# Patient Record
Sex: Female | Born: 2000
Health system: Southern US, Community
[De-identification: ages and names within clinical notes are randomized; demographics above are authoritative.]

## PROBLEM LIST (undated history)

## (undated) ENCOUNTER — Ambulatory Visit (HOSPITAL_COMMUNITY): Payer: Medicaid Other

## (undated) ENCOUNTER — Ambulatory Visit (HOSPITAL_COMMUNITY): Admission: EM | Payer: Medicaid Other | Source: Home / Self Care

## (undated) DIAGNOSIS — G43909 Migraine, unspecified, not intractable, without status migrainosus: Secondary | ICD-10-CM

## (undated) DIAGNOSIS — F1120 Opioid dependence, uncomplicated: Secondary | ICD-10-CM

## (undated) DIAGNOSIS — F4010 Social phobia, unspecified: Secondary | ICD-10-CM

## (undated) DIAGNOSIS — N920 Excessive and frequent menstruation with regular cycle: Secondary | ICD-10-CM

## (undated) DIAGNOSIS — F32A Depression, unspecified: Secondary | ICD-10-CM

## (undated) DIAGNOSIS — F209 Schizophrenia, unspecified: Secondary | ICD-10-CM

## (undated) DIAGNOSIS — F329 Major depressive disorder, single episode, unspecified: Secondary | ICD-10-CM

## (undated) DIAGNOSIS — N898 Other specified noninflammatory disorders of vagina: Secondary | ICD-10-CM

## (undated) DIAGNOSIS — Z309 Encounter for contraceptive management, unspecified: Secondary | ICD-10-CM

## (undated) DIAGNOSIS — R300 Dysuria: Secondary | ICD-10-CM

## (undated) DIAGNOSIS — R45851 Suicidal ideations: Secondary | ICD-10-CM

## (undated) DIAGNOSIS — Z7689 Persons encountering health services in other specified circumstances: Secondary | ICD-10-CM

## (undated) DIAGNOSIS — N946 Dysmenorrhea, unspecified: Secondary | ICD-10-CM

## (undated) HISTORY — DX: Other specified noninflammatory disorders of vagina: N89.8

## (undated) HISTORY — DX: Migraine, unspecified, not intractable, without status migrainosus: G43.909

## (undated) HISTORY — DX: Encounter for contraceptive management, unspecified: Z30.9

## (undated) HISTORY — DX: Dysuria: R30.0

## (undated) HISTORY — PX: NO PAST SURGERIES: SHX2092

## (undated) HISTORY — DX: Dysmenorrhea, unspecified: N94.6

## (undated) HISTORY — DX: Persons encountering health services in other specified circumstances: Z76.89

## (undated) HISTORY — DX: Excessive and frequent menstruation with regular cycle: N92.0

---
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
## 2019-02-03 ENCOUNTER — Encounter (HOSPITAL_COMMUNITY): Payer: Self-pay | Admitting: Emergency Medicine

## 2019-02-03 ENCOUNTER — Emergency Department (HOSPITAL_COMMUNITY)
Admission: EM | Admit: 2019-02-03 | Discharge: 2019-02-03 | Disposition: A | Discharge status: Home / Self Care | Payer: Self-pay | Attending: Emergency Medicine | Admitting: Emergency Medicine

## 2019-02-03 ENCOUNTER — Other Ambulatory Visit: Payer: Self-pay

## 2019-02-03 DIAGNOSIS — L551 Sunburn of second degree: Secondary | ICD-10-CM | POA: Insufficient documentation

## 2019-02-03 DIAGNOSIS — F17210 Nicotine dependence, cigarettes, uncomplicated: Secondary | ICD-10-CM | POA: Insufficient documentation

## 2019-02-03 DIAGNOSIS — L559 Sunburn, unspecified: Secondary | ICD-10-CM

## 2019-02-03 DIAGNOSIS — Z79899 Other long term (current) drug therapy: Secondary | ICD-10-CM | POA: Insufficient documentation

## 2019-02-03 NOTE — Discharge Instructions (Addendum)
Please apply cool compresses or soaks, calamine lotion, or aloe vera to your skin.  You may take Aleve or ibuprofen, twice a day to help with the pain.  You may clean your ruptured blister with mild soap and water along with cover it with wet dressings.

## 2019-02-03 NOTE — ED Triage Notes (Signed)
Pt arrives with sunburn that now has blisters on her shoulders that started yesterday. Pt was not able to sleep.

## 2019-02-03 NOTE — ED Provider Notes (Signed)
[facility] EMERGENCY DEPARTMENT Provider Note   CSN: [id number] Arrival date & time: 02/03/19  0867    History   Chief Complaint Chief Complaint  Patient presents with  . Blister    HPI Julia [name] is a 18 y.o. female.     18 y.o female with a PMH of Heroin addiction, Depression,Bipolar 1 presents to the ED with a chief complaint of blister x yesterday. Patient reports she was out in the sun on Monday did not apply any sun screen on and now has redness of her skin along with a big blister surrounded with small blisters around her shoulders. She reports pain with touch along with unable to sleep due to redness. She  Has tried calamine lotion without improvement. She denies any fever, abdominal pain or rashes.   The history is provided by the patient and medical records.    Past Medical History:  Diagnosis Date  . Burning with urination 05/03/2015  . Contraceptive management 05/03/2015  . Depression   . Dysmenorrhea 12/30/2013  . Heroin addiction ([place])   . Menorrhagia 12/30/2013  . Menstrual extraction 12/30/2013  . Migraines   . Social anxiety disorder 09/25/2016  . Vaginal odor 05/03/2015    Patient Active Problem List   Diagnosis Date Noted  . Bipolar 1 disorder, depressed, severe ([place]) 01/06/2018  . Polysubstance dependence including opioid type drug, continuous use ([place]) 01/06/2018  . Petechiae 10/09/2017  . Social anxiety disorder 09/25/2016  . MDD (major depressive disorder), recurrent episode ([place]) 09/24/2016  . Substance induced mood disorder ([place]) 04/29/2016  . Depressed mood 04/17/2016  . Polysubstance abuse ([place])   . Recurrent UTI (urinary tract infection) postcoital 08/21/2015  . Dysmenorrhea 12/30/2013    Past Surgical History:  Procedure Laterality Date  . NO PAST SURGERIES       OB History    Gravida  0   Para      Term      Preterm      AB      Living        SAB      TAB      Ectopic      Multiple      Live Births               Home Medications    Prior to Admission medications   Medication Sig Start Date End Date Taking? Authorizing Provider  etonogestrel (NEXPLANON) 68 MG IMPL implant 1 each (68 mg total) by Subdermal route once for 1 dose. (Birth control method) 01/08/18 01/08/18  Lindell Spar I, NP  hydrOXYzine (ATARAX/VISTARIL) 50 MG tablet Take 1 tablet (50 mg total) by mouth every 6 (six) hours as needed for anxiety. 01/08/18   Lindell Spar I, NP  nicotine polacrilex (NICORETTE) 2 MG gum Take 1 each (2 mg total) by mouth as needed for smoking cessation. (May buy from over the counter): For smoking cessation 01/08/18   Lindell Spar I, NP  potassium chloride SA (K-DUR) 20 MEQ tablet Take 1 tablet (20 mEq total) by mouth 2 (two) times daily. 12/13/93   Delora Fuel, MD  traZODone (DESYREL) 50 MG tablet Take 1 tablet (50 mg total) by mouth at bedtime as needed for sleep. 01/08/18   Encarnacion Slates, NP    Family History Family History  Problem Relation Age of Onset  . Depression Mother   . Hypertension Father   . Hyperlipidemia Father   . Cancer Paternal Grandmother  breast, uterine  . Cirrhosis Paternal Grandfather        due to alcohol    Social History Social History   Tobacco Use  . Smoking status: Current Every Day Smoker    Packs/day: 0.25    Types: Cigarettes  . Smokeless tobacco: Never Used  . Tobacco comment: parents smoke   Substance Use Topics  . Alcohol use: Yes  . Drug use: Yes    Types: Marijuana, Cocaine, Oxycodone    Comment: opitates, xanax, canned air     Allergies   Patient has no known allergies.   Review of Systems Review of Systems  Constitutional: Negative for fever.  Skin: Positive for color change. Negative for pallor, rash and wound.     Physical Exam Updated Vital Signs BP 116/70   Pulse 88   Temp 97.7 F (36.5 C) (Oral)   Resp 17   Ht 5\' 3"  (1.6 m)   Wt 61.2 kg   SpO2 99%   BMI 23.91 kg/m   Physical Exam Vitals  signs and nursing note reviewed.  Constitutional:      General: She is not in acute distress.    Appearance: She is well-developed.  HENT:     Head: Normocephalic and atraumatic.     Mouth/Throat:     Pharynx: No oropharyngeal exudate.  Eyes:     Pupils: Pupils are equal, round, and reactive to light.  Neck:     Musculoskeletal: Normal range of motion.  Cardiovascular:     Rate and Rhythm: Regular rhythm.     Heart sounds: Normal heart sounds.  Pulmonary:     Effort: Pulmonary effort is normal. No respiratory distress.     Breath sounds: Normal breath sounds.  Abdominal:     General: Bowel sounds are normal. There is no distension.     Palpations: Abdomen is soft.     Tenderness: There is no abdominal tenderness.  Musculoskeletal:        General: No tenderness or deformity.     Right shoulder: She exhibits pain. She exhibits normal strength.     Left shoulder: She exhibits no pain.       Arms:     Right lower leg: No edema.     Left lower leg: No edema.  Skin:    General: Skin is warm and dry.  Neurological:     Mental Status: She is alert and oriented to person, place, and time.      ED Treatments / Results  Labs (all labs ordered are listed, but only abnormal results are displayed) Labs Reviewed - No data to display  EKG None  Radiology No results found.  Procedures Procedures (including critical care time)  Medications Ordered in ED Medications - No data to display   Initial Impression / Assessment and Plan / ED Course  I have reviewed the triage vital signs and the nursing notes.  Pertinent labs & imaging results that were available during my care of the patient were reviewed by me and considered in my medical decision making (see chart for details).    Patient with a past medical history of bipolar disorder presents to the ED with complaints of sunburn since yesterday.  Patient reports she was out in the sun on Monday, did not apply any sunscreen now  has blisters along her right shoulder along with small ones on her left shoulder.  During primary evaluation skin appears erythematous, small pinpoint blisters throughout bilateral shoulders, one large blister on  the right shoulder.  Reports she has been applying calamine lotion however has not attempted to pop this blister.  Patient's vitals are stable, she is had no fever, headache, nausea or vomiting.  Low suspicion for sun intoxication.  Patient currently has trazodone listed as 1 of her dedication's, this could be causing hypersensitivity reaction however patient reports she has not taken this medication in several months.  Advised patient she would need to keep skin hydrated, once blister ruptures she may clean this with mild soap and water.  Patient with otherwise stable vital signs discharged from the ED afebrile.  Return precautions discussed at length.     Portions of this note were generated with [employer]. Dictation errors may occur despite best attempts at proofreading.   Final Clinical Impressions(s) / ED Diagnoses   Final diagnoses:  Burn from the sun  Sunburn, blistering    ED Discharge Orders    None       Janeece Fitting, [place] 02/03/19 [id number]    Tegeler, Gwenyth Allegra, MD 02/03/19 [phone]

## 2019-02-16 ENCOUNTER — Encounter (HOSPITAL_COMMUNITY): Payer: Self-pay | Admitting: Clinical

## 2019-02-16 ENCOUNTER — Other Ambulatory Visit: Payer: Self-pay | Admitting: Behavioral Health

## 2019-02-16 ENCOUNTER — Other Ambulatory Visit: Payer: Self-pay

## 2019-02-16 ENCOUNTER — Inpatient Hospital Stay (HOSPITAL_COMMUNITY)
Admission: RE | Admit: 2019-02-16 | Discharge: 2019-02-19 | DRG: 885 | Disposition: A | Discharge status: Home / Self Care | Payer: Federal, State, Local not specified - Other | Attending: Psychiatry | Admitting: Psychiatry

## 2019-02-16 DIAGNOSIS — F17210 Nicotine dependence, cigarettes, uncomplicated: Secondary | ICD-10-CM | POA: Diagnosis present

## 2019-02-16 DIAGNOSIS — F13239 Sedative, hypnotic or anxiolytic dependence with withdrawal, unspecified: Secondary | ICD-10-CM | POA: Diagnosis not present

## 2019-02-16 DIAGNOSIS — Z8249 Family history of ischemic heart disease and other diseases of the circulatory system: Secondary | ICD-10-CM | POA: Diagnosis not present

## 2019-02-16 DIAGNOSIS — G4700 Insomnia, unspecified: Secondary | ICD-10-CM | POA: Diagnosis present

## 2019-02-16 DIAGNOSIS — F4010 Social phobia, unspecified: Secondary | ICD-10-CM | POA: Diagnosis present

## 2019-02-16 DIAGNOSIS — Z915 Personal history of self-harm: Secondary | ICD-10-CM

## 2019-02-16 DIAGNOSIS — F1210 Cannabis abuse, uncomplicated: Secondary | ICD-10-CM | POA: Diagnosis present

## 2019-02-16 DIAGNOSIS — Z818 Family history of other mental and behavioral disorders: Secondary | ICD-10-CM

## 2019-02-16 DIAGNOSIS — F1510 Other stimulant abuse, uncomplicated: Secondary | ICD-10-CM | POA: Diagnosis present

## 2019-02-16 DIAGNOSIS — Z811 Family history of alcohol abuse and dependence: Secondary | ICD-10-CM | POA: Diagnosis not present

## 2019-02-16 DIAGNOSIS — F333 Major depressive disorder, recurrent, severe with psychotic symptoms: Principal | ICD-10-CM | POA: Diagnosis present

## 2019-02-16 DIAGNOSIS — Z20828 Contact with and (suspected) exposure to other viral communicable diseases: Secondary | ICD-10-CM | POA: Diagnosis present

## 2019-02-16 DIAGNOSIS — R45851 Suicidal ideations: Secondary | ICD-10-CM | POA: Diagnosis present

## 2019-02-16 DIAGNOSIS — F410 Panic disorder [episodic paroxysmal anxiety] without agoraphobia: Secondary | ICD-10-CM | POA: Diagnosis present

## 2019-02-16 DIAGNOSIS — F1110 Opioid abuse, uncomplicated: Secondary | ICD-10-CM | POA: Diagnosis present

## 2019-02-16 DIAGNOSIS — Z8349 Family history of other endocrine, nutritional and metabolic diseases: Secondary | ICD-10-CM | POA: Diagnosis not present

## 2019-02-16 LAB — SARS CORONAVIRUS 2 BY RT PCR (HOSPITAL ORDER, PERFORMED IN ~~LOC~~ HOSPITAL LAB): SARS Coronavirus 2: NEGATIVE

## 2019-02-16 MED ORDER — NICOTINE POLACRILEX 2 MG MT GUM
2.0000 mg | CHEWING_GUM | OROMUCOSAL | Status: DC | PRN
  Administered 2019-02-17 – 2019-02-19 (×4): 2 mg via ORAL

## 2019-02-16 MED ORDER — TRAZODONE HCL 50 MG PO TABS
50.0000 mg | ORAL_TABLET | Freq: Every evening | ORAL | Status: DC | PRN
  Administered 2019-02-16: 50 mg via ORAL

## 2019-02-16 MED ORDER — ALUM & MAG HYDROXIDE-SIMETH 200-200-20 MG/5ML PO SUSP: 30.0000 mL | ORAL | Status: DC | PRN

## 2019-02-16 NOTE — Progress Notes (Signed)
UA cup given. Pt made aware. Pending sample.

## 2019-02-16 NOTE — Progress Notes (Signed)
Patient presents with depressed/sad/tearful affect and behavior during admission interview and assessment. VS monitored and recorded. Skin check performed with Suezanne Jacquet and revealed tattoos. Contraband was not found. Patient was oriented to unit and schedule. Pt states "I want to get help for my substance abuse. Im in a deep hole. I need to get out". Pt denies SI/HI/AVH at this time. PO fluids and food provided. Safety maintained. Rest encouraged.    [place] NOVEL CORONAVIRUS (COVID-19) DAILY CHECK-OFF SYMPTOMS - answer yes or no to each - every day NO YES  Have you had a fever in the past 24 hours?  . Fever (Temp > 37.80C / 100F) X   Have you had any of these symptoms in the past 24 hours? . New Cough .  Sore Throat  .  Shortness of Breath .  Difficulty Breathing .  Unexplained Body Aches   X   Have you had any one of these symptoms in the past 24 hours not related to allergies?   . Runny Nose .  Nasal Congestion .  Sneezing   X   If you have had runny nose, nasal congestion, sneezing in the past 24 hours, has it worsened?  X   EXPOSURES - check yes or no X   Have you traveled outside the state in the past 14 days?  X   Have you been in contact with someone with a confirmed diagnosis of COVID-19 or PUI in the past 14 days without wearing appropriate PPE?  X   Have you been living in the same home as a person with confirmed diagnosis of COVID-19 or a PUI (household contact)?    X   Have you been diagnosed with COVID-19?    X              What to do next: Answered NO to all: Answered YES to anything:   Proceed with unit schedule Follow the [facility] Inpatient Flowsheet.

## 2019-02-16 NOTE — BH Assessment (Signed)
Assessment Note  Julia [name] is an 18 y.o. female presenting voluntarily to [facility] for assessment. Patient is tearful. When asked why she is here patient is tearful and states "I have a diagnosis and I don't want one and I don't want anything to be wrong with me." Patient states she was diagnosed with DMDD at 18 years old. Patient reports increased agitation, depression, not needing sleep, thoughts of suicide and mood lability. Patient states until last night she had not slept in 3-4 days. She states it is not uncommon for her to go days without sleeping, even when not under the influence of illegal drugs. Patient reports current suicidal thoughts without specific plan, but also states she does not believe she can keep herself safe. Patient reports 1 prior suicide attempt at 18 years old when she cut her wrists. She endorses HI, stating "I want to hurt people right now." She states that she gets so angry sometimes she feels like she could actually kill someone. She denies AVH. Patient has a history of polysubstance use and accidental overdosing. She reports she was clean for 2 months until using meth 3 days ago. She reports intermittent use. Patient has a history of heroin, cocaine, Xanax, and THC use. Patient states that she does not have any current charges or upcoming court dates.  Patient is alert and oriented x 4. She is dressed appropriately and tearful during points in assessment. Her speech is rapid/pressed, eye contact is fair, and thoughts are somewhat disorganized. Her mood is labile and affect is congruent. Patient's insight, judgement, and impulse control are impaired. Patient does not appear to be responding to internal stimuli or experiencing delusional thought content.  Diagnosis:  Bipolar I, current episode, manic, severe   Polysubstance use   R/o Amphetamine induced bipolar or related disorder  Past Medical History:  Past Medical History:  Diagnosis Date  . Burning with urination  05/03/2015  . Contraceptive management 05/03/2015  . Depression   . Dysmenorrhea 12/30/2013  . Heroin addiction ([place])   . Menorrhagia 12/30/2013  . Menstrual extraction 12/30/2013  . Migraines   . Social anxiety disorder 09/25/2016  . Vaginal odor 05/03/2015    Past Surgical History:  Procedure Laterality Date  . NO PAST SURGERIES      Family History:  Family History  Problem Relation Age of Onset  . Depression Mother   . Hypertension Father   . Hyperlipidemia Father   . Cancer Paternal Grandmother        breast, uterine  . Cirrhosis Paternal Grandfather        due to alcohol    Social History:  reports that she has been smoking cigarettes. She has been smoking about 0.25 packs per day. She has never used smokeless tobacco. She reports current alcohol use. She reports current drug use. Drugs: Marijuana, Cocaine, and Oxycodone.  Additional Social History:     CIWA: CIWA-Ar BP: 121/87 Pulse Rate: (!) 122 COWS:    Allergies: No Known Allergies  Home Medications:  No medications prior to admission.    OB/GYN Status:  No LMP recorded. Patient has had an implant.  General Assessment Data Location of Assessment: [facility] Assessment Services TTS Assessment: In system Is this a Tele or Face-to-Face Assessment?: Face-to-Face Is this an Initial Assessment or a Re-assessment for this encounter?: Initial Assessment Patient Accompanied by:: N/A Language Other than English: No Living Arrangements: Other (Comment)(with father) What gender do you identify as?: Female Marital status: Single Maiden name: [place] Pregnancy  Status: No Living Arrangements: Parent, Other relatives Can pt return to current living arrangement?: Yes Admission Status: Voluntary Is patient capable of signing voluntary admission?: Yes Referral Source: Self/Family/Friend Insurance type: Medicaid     Crisis Care Plan Living Arrangements: Parent, Other relatives Legal Guardian: (self) Name of Psychiatrist:  none Name of Therapist: none  Education Status Is patient currently in school?: No Is the patient employed, unemployed or receiving disability?: Unemployed  Risk to self with the past 6 months Suicidal Ideation: Yes-Currently Present Has patient been a risk to self within the past 6 months prior to admission? : Yes Suicidal Intent: No Has patient had any suicidal intent within the past 6 months prior to admission? : No Is patient at risk for suicide?: Yes Suicidal Plan?: No Has patient had any suicidal plan within the past 6 months prior to admission? : No Access to Means: Yes Specify Access to Suicidal Means: illegal drugs What has been your use of drugs/alcohol within the last 12 months?: meth use 2 days ago Previous Attempts/Gestures: Yes How many times?: 1 Other Self Harm Risks: none noted Triggers for Past Attempts: Unknown Intentional Self Injurious Behavior: None Family Suicide History: No Recent stressful life event(s): Other (Comment)(substance use) Persecutory voices/beliefs?: No Depression: Yes Depression Symptoms: Despondent, Insomnia, Tearfulness, Isolating, Fatigue, Guilt, Loss of interest in usual pleasures, Feeling worthless/self pity, Feeling angry/irritable Substance abuse history and/or treatment for substance abuse?: No Suicide prevention information given to non-admitted patients: Not applicable  Risk to Others within the past 6 months Homicidal Ideation: Yes-Currently Present Does patient have any lifetime risk of violence toward others beyond the six months prior to admission? : No Thoughts of Harm to Others: Yes-Currently Present Comment - Thoughts of Harm to Others: currently wants to hurt others- no one specific Current Homicidal Intent: No Current Homicidal Plan: No Access to Homicidal Means: No Identified Victim: none History of harm to others?: No Assessment of Violence: None Noted Violent Behavior Description: none noted Does patient have  access to weapons?: No Criminal Charges Pending?: No Does patient have a court date: No Is patient on probation?: No  Psychosis Hallucinations: None noted Delusions: None noted  Mental Status Report Appearance/Hygiene: Unremarkable Eye Contact: Fair Motor Activity: Freedom of movement Speech: Rapid, Pressured Level of Consciousness: Restless, Irritable Mood: Labile Affect: Labile Anxiety Level: Moderate Thought Processes: Circumstantial Judgement: Impaired Orientation: Person, Place, Time, Situation Obsessive Compulsive Thoughts/Behaviors: None  Cognitive Functioning Concentration: Poor Memory: Recent Intact, Remote Intact Is patient IDD: No Insight: Poor Impulse Control: Poor Appetite: Poor Have you had any weight changes? : Loss Amount of the weight change? (lbs): (UTA) Sleep: Decreased Total Hours of Sleep: 0 Vegetative Symptoms: None  ADLScreening [facility] Assessment Services) Patient's cognitive ability adequate to safely complete daily activities?: Yes Patient able to express need for assistance with ADLs?: Yes Independently performs ADLs?: Yes (appropriate for developmental age)  Prior Inpatient Therapy Prior Inpatient Therapy: Yes Prior Therapy Dates: 2017 Prior Therapy Facilty/Provider(s): [facility] Reason for Treatment: substance use, self harm  Prior Outpatient Therapy Prior Outpatient Therapy: No Does patient have an ACCT team?: No Does patient have Intensive In-House Services?  : No Does patient have [facility] services? : No Does patient have P4CC services?: No  ADL Screening (condition at time of admission) Patient's cognitive ability adequate to safely complete daily activities?: Yes Is the patient deaf or have difficulty hearing?: No Does the patient have difficulty seeing, even when wearing glasses/contacts?: No Does the patient have difficulty concentrating, remembering, or  making decisions?: No Patient able to express need for assistance with  ADLs?: Yes Does the patient have difficulty dressing or bathing?: No Independently performs ADLs?: Yes (appropriate for developmental age) Does the patient have difficulty walking or climbing stairs?: No Weakness of Legs: None Weakness of Arms/Hands: None  Home Assistive Devices/Equipment Home Assistive Devices/Equipment: None  Therapy Consults (therapy consults require a physician order) PT Evaluation Needed: No OT Evalulation Needed: No SLP Evaluation Needed: No Abuse/Neglect Assessment (Assessment to be complete while patient is alone) Abuse/Neglect Assessment Can Be Completed: Unable to assess, patient is non-responsive or altered mental status Values / Beliefs Cultural Requests During Hospitalization: None Spiritual Requests During Hospitalization: None Consults Spiritual Care Consult Needed: No Social Work Consult Needed: No Regulatory affairs officer (For Healthcare) Does Patient Have a Medical Advance Directive?: No Would patient like information on creating a medical advance directive?: No - Patient declined          Disposition: Mordecai Maes, NP recommends in patient treatment. Patient accepted to [facility] 303-1. Disposition Initial Assessment Completed for this Encounter: Yes Disposition of Patient: Admit Type of inpatient treatment program: Adult Patient refused recommended treatment: No Mode of transportation if patient is discharged/movement?: N/A  On Site Evaluation by:   Reviewed with Physician:    Orvis Brill 02/16/2019 3:43 PM

## 2019-02-16 NOTE — H&P (Signed)
Behavioral Health Medical Screening Exam  Julia [name] is an 18 y.o. female.who presents as a walk-in to [facility], voluntarily. She endorses she is stressed after receiving a diagnosis of DMDD in 2016. During this evaluation her mood is very labile. She admits to having mood swings that consists of irritability. She notes that last night she slept well however for the 3-4 not prior, she had not slept. She reports anxiety describes as excessive worry and reports having experienced recent panic attacks. As per chart review, she has had admissions to [facility] in 2019 and on 11/13/2018 she went to the ED following an opioid overdose. She admits to using meth with last use two days ago. She also admits to using heroin in the past Xanax/pain pills two weeks ago. She endorses suicidal thoughts, denies pan or intent, although is unable to contract for safety if she goes home. When asked about homicidal ideations she reports having thoughts of wanting to harm someone, non specific. She denies AVH. Reports a history of cutting behaviors but non recent.   Total Time spent with patient: 20 minutes  Psychiatric Specialty Exam: Physical Exam  Vitals reviewed. Constitutional: She is oriented to person, place, and time.  Neurological: She is alert and oriented to person, place, and time.    ROS  Blood pressure 121/87, pulse (!) 122, temperature 98.4 F (36.9 C), resp. rate 16, SpO2 99 %.There is no height or weight on file to calculate BMI.  General Appearance: Fairly Groomed  Eye Contact:  Good  Speech:  Clear and Coherent and Normal Rate  Volume:  Increased  Mood:  Anxious and Depressed  Affect:  Labile  Thought Process:  Coherent, Linear and Descriptions of Associations: Intact  Orientation:  Full (Time, Place, and Person)  Thought Content:  WDL  Suicidal Thoughts:  Yes.  without intent/plan  Homicidal Thoughts:  No  Memory:  Immediate;   Fair Recent;   Fair  Judgement:  Fair  Insight:  Fair  Psychomotor  Activity:  Normal  Concentration: Concentration: Fair and Attention Span: Fair  Recall:  [employer] of Knowledge:Fair  Language: Good  Akathisia:  Negative  Handed:  Right  AIMS (if indicated):     Assets:  Communication Skills Resilience  Sleep:       Musculoskeletal: Strength & Muscle Tone: within normal limits Gait & Station: normal Patient leans: N/A  Blood pressure 121/87, pulse (!) 122, temperature 98.4 F (36.9 C), resp. rate 16, SpO2 99 %.  Recommendations:  Based on my evaluation the patient does not appear to have an emergency medical condition.   There is evidence of imminent risk to self or others at present.   Patient meet criteria for psychiatric inpatient admission. Patient has been given bed 303 on the adult psychiatric unit.      Mordecai Maes, NP 02/16/2019, 3:26 PM

## 2019-02-17 DIAGNOSIS — F333 Major depressive disorder, recurrent, severe with psychotic symptoms: Principal | ICD-10-CM

## 2019-02-17 LAB — CBC
HCT: 45 % (ref 36.0–46.0)
Hemoglobin: 14 g/dL (ref 12.0–15.0)
MCH: 28.4 pg (ref 26.0–34.0)
MCHC: 31.1 g/dL (ref 30.0–36.0)
MCV: 91.3 fL (ref 80.0–100.0)
Platelets: 260 10*3/uL (ref 150–400)
RBC: 4.93 MIL/uL (ref 3.87–5.11)
RDW: 13.3 % (ref 11.5–15.5)
WBC: 9.3 10*3/uL (ref 4.0–10.5)
nRBC: 0 % (ref 0.0–0.2)

## 2019-02-17 LAB — PREGNANCY, URINE: Preg Test, Ur: NEGATIVE

## 2019-02-17 LAB — LIPID PANEL
Cholesterol: 136 mg/dL (ref 0–200)
HDL: 37 mg/dL — ABNORMAL LOW (ref >40)
LDL Cholesterol: 76 mg/dL (ref 0–99)
Total CHOL/HDL Ratio: 3.7 RATIO
Triglycerides: 117 mg/dL (ref <150)
VLDL: 23 mg/dL (ref 0–40)

## 2019-02-17 LAB — COMPREHENSIVE METABOLIC PANEL
ALT: 13 U/L (ref 0–44)
AST: 16 U/L (ref 15–41)
Albumin: 4.1 g/dL (ref 3.5–5.0)
Alkaline Phosphatase: 72 U/L (ref 38–126)
Anion gap: 10 (ref 5–15)
BUN: 14 mg/dL (ref 6–20)
CO2: 23 mmol/L (ref 22–32)
Calcium: 9.4 mg/dL (ref 8.9–10.3)
Chloride: 105 mmol/L (ref 98–111)
Creatinine, Ser: 0.71 mg/dL (ref 0.44–1.00)
GFR calc Af Amer: >60 mL/min (ref >60)
GFR calc non Af Amer: >60 mL/min (ref >60)
Glucose, Bld: 102 mg/dL — ABNORMAL HIGH (ref 70–99)
Potassium: 3.9 mmol/L (ref 3.5–5.1)
Sodium: 138 mmol/L (ref 135–145)
Total Bilirubin: 0.6 mg/dL (ref 0.3–1.2)
Total Protein: 7.5 g/dL (ref 6.5–8.1)

## 2019-02-17 LAB — RAPID URINE DRUG SCREEN, HOSP PERFORMED
Amphetamines: POSITIVE — AB
Barbiturates: NOT DETECTED
Benzodiazepines: NOT DETECTED
Cocaine: NOT DETECTED
Opiates: NOT DETECTED
Tetrahydrocannabinol: POSITIVE — AB

## 2019-02-17 LAB — TSH: TSH: 0.864 u[IU]/mL (ref 0.350–4.500)

## 2019-02-17 LAB — HEMOGLOBIN A1C
Hgb A1c MFr Bld: 5.4 % (ref 4.8–5.6)
Mean Plasma Glucose: 108.28 mg/dL

## 2019-02-17 LAB — ETHANOL: Alcohol, Ethyl (B): <10 mg/dL (ref <10)

## 2019-02-17 MED ORDER — VITAMIN B-1 100 MG PO TABS
100.0000 mg | ORAL_TABLET | Freq: Every day | ORAL | Status: DC
  Administered 2019-02-18 – 2019-02-19 (×2): 100 mg via ORAL
  Filled 2019-02-17 (×4): qty 1

## 2019-02-17 MED ORDER — FLUOXETINE HCL 20 MG PO CAPS
20.0000 mg | ORAL_CAPSULE | Freq: Every day | ORAL | Status: DC
  Administered 2019-02-17 – 2019-02-19 (×3): 20 mg via ORAL
  Filled 2019-02-17: qty 1
  Filled 2019-02-17: qty 7
  Filled 2019-02-17 (×5): qty 1

## 2019-02-17 MED ORDER — ADULT MULTIVITAMIN W/MINERALS CH
1.0000 | ORAL_TABLET | Freq: Every day | ORAL | Status: DC
  Administered 2019-02-18: 1 via ORAL
  Filled 2019-02-17 (×5): qty 1

## 2019-02-17 MED ORDER — TRAZODONE HCL 100 MG PO TABS
100.0000 mg | ORAL_TABLET | Freq: Every evening | ORAL | Status: DC | PRN
  Administered 2019-02-17: 100 mg via ORAL

## 2019-02-17 MED ORDER — MIRTAZAPINE 15 MG PO TBDP
15.0000 mg | ORAL_TABLET | Freq: Every day | ORAL | Status: DC
  Filled 2019-02-17: qty 1

## 2019-02-17 MED ORDER — LOPERAMIDE HCL 2 MG PO CAPS: 2.0000 mg | ORAL_CAPSULE | ORAL | Status: DC | PRN

## 2019-02-17 MED ORDER — ONDANSETRON 4 MG PO TBDP: 4.0000 mg | ORAL_TABLET | Freq: Four times a day (QID) | ORAL | Status: DC | PRN

## 2019-02-17 MED ORDER — LORAZEPAM 1 MG PO TABS
1.0000 mg | ORAL_TABLET | Freq: Four times a day (QID) | ORAL | Status: DC | PRN
  Administered 2019-02-17: 1 mg via ORAL
  Filled 2019-02-17: qty 1

## 2019-02-17 MED ORDER — LORAZEPAM 1 MG PO TABS
ORAL_TABLET | ORAL | Status: AC
  Administered 2019-02-17: 10:00:00
  Filled 2019-02-17: qty 1

## 2019-02-17 MED ORDER — PRENATAL MULTIVITAMIN CH
1.0000 | ORAL_TABLET | Freq: Every day | ORAL | Status: DC
  Administered 2019-02-17 – 2019-02-19 (×3): 1 via ORAL
  Filled 2019-02-17 (×6): qty 1
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
## 2019-02-17 NOTE — Progress Notes (Signed)
Adult Psychoeducational Group Note  Date:  02/17/2019 Time:  10:00 PM  Group Topic/Focus:  Wrap-Up Group:   The focus of this group is to help patients review their daily goal of treatment and discuss progress on daily workbooks.  Participation Level:  Active  Participation Quality:  Appropriate  Affect:  Appropriate  Cognitive:  Appropriate  Insight: Appropriate  Engagement in Group:  Engaged  Modes of Intervention:  Discussion  Additional Comments:  Patient attended group and said that her day was a 4. Her coping skills for today were sleeping and coloring.   Julia [name] Julia [name] 12/16/2901, 10:00 PM

## 2019-02-17 NOTE — Progress Notes (Signed)
Patient ID: Julia [name], female   DOB: 2001/07/14, 18 y.o.   MRN: [id number] D) Pt anxious but appropriate and cooperative on approach. Pt isolative to her room requiring prompting for groups and activities although pt declined rec time. Pt is known to this Probation officer from the child/adolescent unit. Pt says that she left home and was abusing substances until "it got [place] control" and has recently returned home with father. Pt states that she's tired of being tired and feels this is her rock bottom.rates her depression as a 9/10; 10 being the worst. Hopelessness and anxiety rated the same. Pt c/o "agitation" and irritability. Julia [name] wants to work on improving her attitude and thinking positive. Contracts verbally for safety. A) level 3 obs or safety. Support and encouragement provided. Med ed initiated and reinforced. R) Receptive.

## 2019-02-17 NOTE — Tx Team (Signed)
Interdisciplinary Treatment and Diagnostic Plan Update  02/17/2019 Time of Session: 9:00am Julia [name] MRN: [id number]  Principal Diagnosis: <principal problem not specified>  Secondary Diagnoses: Active Problems:   MDD (major depressive disorder), recurrent, severe, with psychosis (Julia [name])   Current Medications:  Current Facility-Administered Medications  Medication Dose Route Frequency Provider Last Rate Last Dose  . alum & mag hydroxide-simeth (MAALOX/MYLANTA) 200-200-20 MG/5ML suspension 30 mL  30 mL Oral Q4H PRN Mordecai Maes, NP      . FLUoxetine (PROZAC) capsule 20 mg  20 mg Oral Daily [name], MD   20 mg at 02/17/19 1007  . loperamide (IMODIUM) capsule 2-4 mg  2-4 mg Oral PRN Cobos, Myer Peer, MD      . LORazepam (ATIVAN) tablet 1 mg  1 mg Oral Q6H PRN Cobos, Myer Peer, MD      . multivitamin with minerals tablet 1 tablet  1 tablet Oral Daily Cobos, Myer Peer, MD      . nicotine polacrilex (NICORETTE) gum 2 mg  2 mg Oral PRN Cobos, Myer Peer, MD      . ondansetron (ZOFRAN-ODT) disintegrating tablet 4 mg  4 mg Oral Q6H PRN Cobos, Myer Peer, MD      . prenatal multivitamin tablet 1 tablet  1 tablet Oral Daily [name], MD   1 tablet at 02/17/19 1006  . [START ON 02/18/2019] thiamine (VITAMIN B-1) tablet 100 mg  100 mg Oral Daily Cobos, Myer Peer, MD      . traZODone (DESYREL) tablet 50 mg  50 mg Oral QHS PRN [name], NP   50 mg at 02/16/19 2340   PTA Medications: Medications Prior to Admission  Medication Sig Dispense Refill Last Dose  . etonogestrel (NEXPLANON) 68 MG IMPL implant 1 each (68 mg total) by Subdermal route once for 1 dose. (Birth control method) 1 each 0   . hydrOXYzine (ATARAX/VISTARIL) 50 MG tablet Take 1 tablet (50 mg total) by mouth every 6 (six) hours as needed for anxiety. 60 tablet 0   . nicotine polacrilex (NICORETTE) 2 MG gum Take 1 each (2 mg total) by mouth as needed for smoking cessation. (May buy from over the counter): For smoking  cessation 100 tablet 0   . potassium chloride SA (K-DUR) 20 MEQ tablet Take 1 tablet (20 mEq total) by mouth 2 (two) times daily. 10 tablet 0   . traZODone (DESYREL) 50 MG tablet Take 1 tablet (50 mg total) by mouth at bedtime as needed for sleep. 30 tablet 0     Patient Stressors:    Patient Strengths:    Treatment Modalities: Medication Management, Group therapy, Case management,  1 to 1 session with clinician, Psychoeducation, Recreational therapy.   Physician Treatment Plan for Primary Diagnosis: <principal problem not specified> Long Term Goal(s): Improvement in symptoms so as ready for discharge Improvement in symptoms so as ready for discharge   Short Term Goals: Ability to identify changes in lifestyle to reduce recurrence of condition will improve Ability to verbalize feelings will improve Ability to disclose and discuss suicidal ideas Ability to demonstrate self-control will improve Ability to identify and develop effective coping behaviors will improve Ability to maintain clinical measurements within normal limits will improve Ability to identify changes in lifestyle to reduce recurrence of condition will improve Ability to verbalize feelings will improve Ability to disclose and discuss suicidal ideas Ability to demonstrate self-control will improve Ability to identify and develop effective coping behaviors will improve Ability to maintain clinical measurements  within normal limits will improve  Medication Management: Evaluate patient's response, side effects, and tolerance of medication regimen.  Therapeutic Interventions: 1 to 1 sessions, Unit Group sessions and Medication administration.  Evaluation of Outcomes: Progressing  Physician Treatment Plan for Secondary Diagnosis: Active Problems:   MDD (major depressive disorder), recurrent, severe, with psychosis (Julia [name])  Long Term Goal(s): Improvement in symptoms so as ready for discharge Improvement in symptoms so as  ready for discharge   Short Term Goals: Ability to identify changes in lifestyle to reduce recurrence of condition will improve Ability to verbalize feelings will improve Ability to disclose and discuss suicidal ideas Ability to demonstrate self-control will improve Ability to identify and develop effective coping behaviors will improve Ability to maintain clinical measurements within normal limits will improve Ability to identify changes in lifestyle to reduce recurrence of condition will improve Ability to verbalize feelings will improve Ability to disclose and discuss suicidal ideas Ability to demonstrate self-control will improve Ability to identify and develop effective coping behaviors will improve Ability to maintain clinical measurements within normal limits will improve     Medication Management: Evaluate patient's response, side effects, and tolerance of medication regimen.  Therapeutic Interventions: 1 to 1 sessions, Unit Group sessions and Medication administration.  Evaluation of Outcomes: Progressing   RN Treatment Plan for Primary Diagnosis: <principal problem not specified> Long Term Goal(s): Knowledge of disease and therapeutic regimen to maintain health will improve  Short Term Goals: Ability to remain free from injury will improve, Ability to verbalize frustration and anger appropriately will improve, Ability to identify and develop effective coping behaviors will improve and Compliance with prescribed medications will improve  Medication Management: RN will administer medications as ordered by provider, will assess and evaluate patient's response and provide education to patient for prescribed medication. RN will report any adverse and/or side effects to prescribing provider.  Therapeutic Interventions: 1 on 1 counseling sessions, Psychoeducation, Medication administration, Evaluate responses to treatment, Monitor vital signs and CBGs as ordered, Perform/monitor CIWA,  COWS, AIMS and Fall Risk screenings as ordered, Perform wound care treatments as ordered.  Evaluation of Outcomes: Progressing   LCSW Treatment Plan for Primary Diagnosis: <principal problem not specified> Long Term Goal(s): Safe transition to appropriate next level of care at discharge, Engage patient in therapeutic group addressing interpersonal concerns.  Short Term Goals: Engage patient in aftercare planning with referrals and resources, Increase social support, Increase emotional regulation, Identify triggers associated with mental health/substance abuse issues and Increase skills for wellness and recovery  Therapeutic Interventions: Assess for all discharge needs, 1 to 1 time with Social worker, Explore available resources and support systems, Assess for adequacy in community support network, Educate family and significant other(s) on suicide prevention, Complete Psychosocial Assessment, Interpersonal group therapy.  Evaluation of Outcomes: Progressing   Progress in Treatment: Attending groups: No. New to unit. Participating in groups: No. Taking medication as prescribed: Yes. Toleration medication: Yes. Family/Significant other contact made: No, will contact:  supports if consents are granted Patient understands diagnosis: Yes. Discussing patient identified problems/goals with staff: Yes. Medical problems stabilized or resolved: Yes. Denies suicidal/homicidal ideation: No. Issues/concerns per patient self-inventory: Yes.  New problem(s) identified: Yes, Describe:  financial stressors, occupational stressors, family stressors  New Short Term/Long Term Goal(s): detox, medication management for mood stabilization; elimination of SI thoughts; development of comprehensive mental wellness/sobriety plan.  Patient Goals:    Discharge Plan or Barriers: CSW continuing to assess for appropriate referrals.   Reason for Continuation of Hospitalization: Anxiety  Depression Suicidal  ideation  Estimated Length of Stay: 3-5 days  Attendees: Patient: 02/17/2019 12:36 PM  Physician: Larene Beach 02/17/2019 12:36 PM  Nursing: Sharyn Lull, LPN 01/20/9380 01:75 PM  RN Care Manager: 02/17/2019 12:36 PM  Social Worker: [name], [place] 02/17/2019 12:36 PM  Recreational Therapist:  02/17/2019 12:36 PM  Other:  02/17/2019 12:36 PM  Other:  02/17/2019 12:36 PM  Other: 02/17/2019 12:36 PM    Scribe for Treatment Team: Joellen Jersey, [place] 02/17/2019 12:36 PM

## 2019-02-17 NOTE — Progress Notes (Signed)
[place] NOVEL CORONAVIRUS (COVID-19) DAILY CHECK-OFF SYMPTOMS - answer yes or no to each - every day NO YES  Have you had a fever in the past 24 hours?  . Fever (Temp > 37.80C / 100F) X   Have you had any of these symptoms in the past 24 hours? . New Cough .  Sore Throat  .  Shortness of Breath .  Difficulty Breathing .  Unexplained Body Aches   X   Have you had any one of these symptoms in the past 24 hours not related to allergies?   . Runny Nose .  Nasal Congestion .  Sneezing   X   If you have had runny nose, nasal congestion, sneezing in the past 24 hours, has it worsened?  X   EXPOSURES - check yes or no X   Have you traveled outside the state in the past 14 days?  X   Have you been in contact with someone with a confirmed diagnosis of COVID-19 or PUI in the past 14 days without wearing appropriate PPE?  X   Have you been living in the same home as a person with confirmed diagnosis of COVID-19 or a PUI (household contact)?    X   Have you been diagnosed with COVID-19?    X              What to do next: Answered NO to all: Answered YES to anything:   Proceed with unit schedule Follow the [facility] Inpatient Flowsheet.   

## 2019-02-17 NOTE — [facility] Suicide Risk Assessment (Signed)
[facility] INPATIENT:  Family/Significant Other Suicide Prevention Education  Suicide Prevention Education:  Patient Refusal for Family/Significant Other Suicide Prevention Education: The patient Julia [name] has refused to provide written consent for family/significant other to be provided Family/Significant Other Suicide Prevention Education during admission and/or prior to discharge.  Physician notified.  Joellen Jersey 02/17/2019, 1:07 PM

## 2019-02-17 NOTE — Progress Notes (Signed)
Patient ID: Julia [name], female   DOB: November 03, 2000, 18 y.o.   MRN: [id number] [place] NOVEL CORONAVIRUS (COVID-19) DAILY CHECK-OFF SYMPTOMS - answer yes or no to each - every day NO YES  Have you had a fever in the past 24 hours?  . Fever (Temp > 37.80C / 100F) X   Have you had any of these symptoms in the past 24 hours? . New Cough .  Sore Throat  .  Shortness of Breath .  Difficulty Breathing .  Unexplained Body Aches   X   Have you had any one of these symptoms in the past 24 hours not related to allergies?   . Runny Nose .  Nasal Congestion .  Sneezing   X   If you have had runny nose, nasal congestion, sneezing in the past 24 hours, has it worsened?  X   EXPOSURES - check yes or no X   Have you traveled outside the state in the past 14 days?  X   Have you been in contact with someone with a confirmed diagnosis of COVID-19 or PUI in the past 14 days without wearing appropriate PPE?  X   Have you been living in the same home as a person with confirmed diagnosis of COVID-19 or a PUI (household contact)?    X   Have you been diagnosed with COVID-19?    X              What to do next: Answered NO to all: Answered YES to anything:   Proceed with unit schedule Follow the [facility] Inpatient Flowsheet.

## 2019-02-17 NOTE — Progress Notes (Signed)
[place] observe interacting with peers in the dayroom. She denies SI/HI/AVH on approach. She is requesting trazodone be increase HS. Provider on call notified. See MAR. Support offered and safety maintained.

## 2019-02-17 NOTE — [facility] Suicide Risk Assessment (Signed)
[facility] Admission Suicide Risk Assessment   Nursing information obtained from:  Patient Demographic factors:  Caucasian, Adolescent or young adult Current Mental Status:  NA Loss Factors:  Loss of significant relationship, Financial problems / change in socioeconomic status Historical Factors:  Prior suicide attempts, Anniversary of important loss, Family history of mental illness or substance abuse, Family history of suicide Risk Reduction Factors:  Sense of responsibility to family  Total Time spent with patient: 45 minutes Principal Problem: MDD, Social Phobia , Substance Induced Mood Disorder Diagnosis:  Active Problems:   MDD (major depressive disorder), recurrent, severe, with psychosis ([place])  Subjective Data:   Continued Clinical Symptoms:  Alcohol Use Disorder Identification Test Final Score (AUDIT): 0 The "Alcohol Use Disorders Identification Test", Guidelines for Use in Primary Care, Second Edition.  World Pharmacologist [facility]). Score between 0-7:  no or low risk or alcohol related problems. Score between 8-15:  moderate risk of alcohol related problems. Score between 16-19:  high risk of alcohol related problems. Score 20 or above:  warrants further diagnostic evaluation for alcohol dependence and treatment.   CLINICAL FACTORS:  18 year old female, presented for worsening depression, anxiety, passive SI, neuro-vegetative symptoms. History of opiate dependence, states abstinent from opiates x 2 months. Reports recent methamphetamine and BZD ( Xanax) use , although irregularly, and states she last used Xanax more than a week ago.  Psychiatric Specialty Exam: Physical Exam  ROS  Blood pressure 112/78, pulse 81, temperature 97.6 F (36.4 C), temperature source Oral, resp. rate 18, height 5\' 3"  (1.6 m), weight 61.2 kg, SpO2 100 %.Body mass index is 23.9 kg/m.  See admit note MSE   COGNITIVE FEATURES THAT CONTRIBUTE TO RISK:  Closed-mindedness and Loss of executive  function    SUICIDE RISK:   Moderate:  Frequent suicidal ideation with limited intensity, and duration, some specificity in terms of plans, no associated intent, good self-control, limited dysphoria/symptomatology, some risk factors present, and identifiable protective factors, including available and accessible social support.  PLAN OF CARE: Patient will be admitted to inpatient psychiatric unit for stabilization and safety. Will provide and encourage milieu participation. Provide medication management and maked adjustments as needed.  Will follow daily.    I certify that inpatient services furnished can reasonably be expected to improve the patient's condition.   Jenne Campus, MD 02/17/2019, 9:48 AM
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
## 2019-02-18 LAB — PROLACTIN: Prolactin: 31.9 ng/mL — ABNORMAL HIGH (ref 4.8–23.3)

## 2019-02-18 MED ORDER — GABAPENTIN 100 MG PO CAPS
100.0000 mg | ORAL_CAPSULE | Freq: Three times a day (TID) | ORAL | Status: DC
  Filled 2019-02-18 (×3): qty 1

## 2019-02-18 MED ORDER — TRAZODONE HCL 50 MG PO TABS
50.0000 mg | ORAL_TABLET | Freq: Every evening | ORAL | Status: DC | PRN
  Administered 2019-02-18: 50 mg via ORAL
  Filled 2019-02-18: qty 1

## 2019-02-18 MED ORDER — FLUOXETINE HCL 20 MG PO CAPS
20.0000 mg | ORAL_CAPSULE | Freq: Once | ORAL | Status: AC
  Administered 2019-02-18: 20 mg via ORAL
  Filled 2019-02-18: qty 1

## 2019-02-18 MED ORDER — LORAZEPAM 0.5 MG PO TABS
0.5000 mg | ORAL_TABLET | Freq: Four times a day (QID) | ORAL | Status: DC | PRN
  Administered 2019-02-18 (×2): 0.5 mg via ORAL
  Filled 2019-02-18 (×2): qty 1

## 2019-02-18 NOTE — Progress Notes (Signed)
Patient requested half-way house information for [facility].  CSW provided patient with a list of [place] and advised patient to call the listings today and request a phone interview.  [name], LCSW-A Clinical Social Worker

## 2019-02-18 NOTE — Progress Notes (Addendum)
[facility] MD Progress Note  02/18/2019 10:54 AM Julia [name]  MRN:  [id number] Subjective: patient describes some improvement but continues to report depression and sense of apprehension , anxiety. She states she vomited earlier today  after taking her medications. She does not think that this was related to the medications per se or to side effects, as she has tolerated Prozac well in the past without side effects, but rather states she has intermittent nausea, occasionally vomiting/GERD symptoms. Denies SI. Objective : I have discussed case with treatment team and have met with patient. 18 year old female, presented for worsening depression, anxiety, passive SI, neuro-vegetative symptoms. History of opiate dependence, states abstinent from opiates x 2 months. Reports recent methamphetamine and BZD ( Xanax) use , although irregularly, and states she last used Xanax more than a week ago. As above, reports intermittent nausea and vomiting x 1 earlier today.  Does not endorse medication side effects and does not feel that GI symptoms are medication related at this time. She is not presenting with BZD withdrawal symptoms- no tremors, no restlessness, no agitation, vitals stable. Behavior on unit in good control, interacting more with peers . Denies suicidal ideations.  Principal Problem: depression Diagnosis: Active Problems:   MDD (major depressive disorder), recurrent, severe, with psychosis ([place])  Total Time spent with patient: 20 minutes  Past Psychiatric History:   Past Medical History:  Past Medical History:  Diagnosis Date  . Burning with urination 05/03/2015  . Contraceptive management 05/03/2015  . Depression   . Dysmenorrhea 12/30/2013  . Heroin addiction ([place])   . Menorrhagia 12/30/2013  . Menstrual extraction 12/30/2013  . Migraines   . Social anxiety disorder 09/25/2016  . Vaginal odor 05/03/2015    Past Surgical History:  Procedure Laterality Date  . NO PAST SURGERIES      Family History:  Family History  Problem Relation Age of Onset  . Depression Mother   . Hypertension Father   . Hyperlipidemia Father   . Cancer Paternal Grandmother        breast, uterine  . Cirrhosis Paternal Grandfather        due to alcohol   Family Psychiatric  History:  Social History:  Social History   Substance and Sexual Activity  Alcohol Use Yes     Social History   Substance and Sexual Activity  Drug Use Yes  . Types: Marijuana, Cocaine, Oxycodone   Comment: opitates, xanax, canned air    Social History   Socioeconomic History  . Marital status: Single    Spouse name: Not on file  . Number of children: Not on file  . Years of education: Not on file  . Highest education level: Not on file  Occupational History  . Not on file  Social Needs  . Financial resource strain: Not on file  . Food insecurity    Worry: Not on file    Inability: Not on file  . Transportation needs    Medical: Not on file    Non-medical: Not on file  Tobacco Use  . Smoking status: Current Every Day Smoker    Packs/day: 0.25    Types: Cigarettes  . Smokeless tobacco: Never Used  . Tobacco comment: parents smoke   Substance and Sexual Activity  . Alcohol use: Yes  . Drug use: Yes    Types: Marijuana, Cocaine, Oxycodone    Comment: opitates, xanax, canned air  . Sexual activity: Yes    Birth control/protection: Implant  Lifestyle  .  Physical activity    Days per week: Not on file    Minutes per session: Not on file  . Stress: Not on file  Relationships  . Social Herbalist on phone: Not on file    Gets together: Not on file    Attends religious service: Not on file    Active member of club or organization: Not on file    Attends meetings of clubs or organizations: Not on file    Relationship status: Not on file  Other Topics Concern  . Not on file  Social History Narrative   Lives with Dad. Mom has LGD, lives in [place]. 11th grader. Dog.   Additional  Social History:   Sleep: improving  Appetite:  Fair  Current Medications: Current Facility-Administered Medications  Medication Dose Route Frequency Provider Last Rate Last Dose  . alum & mag hydroxide-simeth (MAALOX/MYLANTA) 200-200-20 MG/5ML suspension 30 mL  30 mL Oral Q4H PRN Mordecai Maes, NP      . FLUoxetine (PROZAC) capsule 20 mg  20 mg Oral Daily [name], MD   20 mg at 02/18/19 0751  . gabapentin (NEURONTIN) capsule 100 mg  100 mg Oral TID Lindell Spar I, NP      . loperamide (IMODIUM) capsule 2-4 mg  2-4 mg Oral PRN Dejon Jungman, Myer Peer, MD      . LORazepam (ATIVAN) tablet 1 mg  1 mg Oral Q6H PRN Marquize Seib, Myer Peer, MD   1 mg at 02/17/19 1816  . multivitamin with minerals tablet 1 tablet  1 tablet Oral Daily Alem Fahl, Myer Peer, MD   1 tablet at 02/18/19 0751  . nicotine polacrilex (NICORETTE) gum 2 mg  2 mg Oral PRN Alexsander Cavins, Myer Peer, MD   2 mg at 02/17/19 1632  . ondansetron (ZOFRAN-ODT) disintegrating tablet 4 mg  4 mg Oral Q6H PRN Teran Daughenbaugh, Myer Peer, MD      . prenatal multivitamin tablet 1 tablet  1 tablet Oral Daily [name], MD   1 tablet at 02/18/19 0751  . thiamine (VITAMIN B-1) tablet 100 mg  100 mg Oral Daily Nzinga Ferran, Myer Peer, MD   100 mg at 02/18/19 0751  . traZODone (DESYREL) tablet 100 mg  100 mg Oral QHS PRN [name], NP   100 mg at 02/17/19 2121    Lab Results:  Results for orders placed or performed during the hospital encounter of 02/16/19 (from the past 48 hour(s))  SARS Coronavirus 2 [facility] order, Performed in [place] hospital lab)     Status: None   Collection Time: 02/16/19  2:07 PM  Result Value Ref Range   SARS Coronavirus 2 NEGATIVE NEGATIVE    Comment: (NOTE) If result is NEGATIVE SARS-CoV-2 target nucleic acids are NOT DETECTED. The SARS-CoV-2 RNA is generally detectable in upper and lower  respiratory specimens during the acute phase of infection. The lowest  concentration of SARS-CoV-2 viral copies this assay can detect is  250  copies / mL. A negative result does not preclude SARS-CoV-2 infection  and should not be used as the sole basis for treatment or other  patient management decisions.  A negative result may occur with  improper specimen collection / handling, submission of specimen other  than nasopharyngeal swab, presence of viral mutation(s) within the  areas targeted by this assay, and inadequate number of viral copies  (<250 copies / mL). A negative result must be combined with clinical  observations, patient history, and epidemiological information. If result  is POSITIVE SARS-CoV-2 target nucleic acids are DETECTED. The SARS-CoV-2 RNA is generally detectable in upper and lower  respiratory specimens dur ing the acute phase of infection.  Positive  results are indicative of active infection with SARS-CoV-2.  Clinical  correlation with patient history and other diagnostic information is  necessary to determine patient infection status.  Positive results do  not rule out bacterial infection or co-infection with other viruses. If result is PRESUMPTIVE POSTIVE SARS-CoV-2 nucleic acids MAY BE PRESENT.   A presumptive positive result was obtained on the submitted specimen  and confirmed on repeat testing.  While 2019 novel coronavirus  (SARS-CoV-2) nucleic acids may be present in the submitted sample  additional confirmatory testing may be necessary for epidemiological  and / or clinical management purposes  to differentiate between  SARS-CoV-2 and other Sarbecovirus currently known to infect humans.  If clinically indicated additional testing with an alternate test  methodology [phone]) is advised. The SARS-CoV-2 RNA is generally  detectable in upper and lower respiratory sp ecimens during the acute  phase of infection. The expected result is Negative. Fact Sheet for Patients:  StrictlyIdeas.no Fact Sheet for Healthcare  Providers: BankingDealers.co.za This test is not yet approved or cleared by the [place] FDA and has been authorized for detection and/or diagnosis of SARS-CoV-2 by FDA under an Emergency Use Authorization (EUA).  This EUA will remain in effect (meaning this test can be used) for the duration of the COVID-19 declaration under Section 564(b)(1) of the Act, 21 U.S.C. section 360bbb-3(b)(1), unless the authorization is terminated or revoked sooner. Performed at [facility], [place] [address] [place], [place] [address]   CBC     Status: None   Collection Time: 02/17/19  6:33 AM  Result Value Ref Range   WBC 9.3 4.0 - 10.5 K/uL   RBC 4.93 3.87 - 5.11 MIL/uL   Hemoglobin 14.0 12.0 - 15.0 g/dL   HCT 45.0 36.0 - 46.0 %   MCV 91.3 80.0 - 100.0 fL   MCH 28.4 26.0 - 34.0 pg   MCHC 31.1 30.0 - 36.0 g/dL   RDW 13.3 11.5 - 15.5 %   Platelets 260 150 - 400 K/uL   nRBC 0.0 0.0 - 0.2 %    Comment: Performed at [facility], [place] [address], [place], [place] [address]  Comprehensive metabolic panel     Status: Abnormal   Collection Time: 02/17/19  6:33 AM  Result Value Ref Range   Sodium 138 135 - 145 mmol/L   Potassium 3.9 3.5 - 5.1 mmol/L   Chloride 105 98 - 111 mmol/L   CO2 23 22 - 32 mmol/L   Glucose, Bld 102 (H) 70 - 99 mg/dL   BUN 14 6 - 20 mg/dL   Creatinine, Ser 0.71 0.44 - 1.00 mg/dL   Calcium 9.4 8.9 - 10.3 mg/dL   Total Protein 7.5 6.5 - 8.1 g/dL   Albumin 4.1 3.5 - 5.0 g/dL   AST 16 15 - 41 U/L   ALT 13 0 - 44 U/L   Alkaline Phosphatase 72 38 - 126 U/L   Total Bilirubin 0.6 0.3 - 1.2 mg/dL   GFR calc non Af Amer >60 >60 mL/min   GFR calc Af Amer >60 >60 mL/min   Anion gap 10 5 - 15    Comment: Performed at [facility], [place] [address] [place], [place] [address]  Ethanol     Status: None   Collection Time:  02/17/19  6:33 AM  Result Value Ref Range   Alcohol, Ethyl (B) <10 <10 mg/dL     Comment: (NOTE) Lowest detectable limit for serum alcohol is 10 mg/dL. For medical purposes only. Performed at [facility], [place] [address], [place], [place] [address]   Hemoglobin A1c     Status: None   Collection Time: 02/17/19  6:33 AM  Result Value Ref Range   Hgb A1c MFr Bld 5.4 4.8 - 5.6 %    Comment: (NOTE) Pre diabetes:          5.7%-6.4% Diabetes:              >6.4% Glycemic control for   <7.0% adults with diabetes    Mean Plasma Glucose 108.28 mg/dL    Comment: Performed at [place] [address] [place], [place] [address]  Lipid panel     Status: Abnormal   Collection Time: 02/17/19  6:33 AM  Result Value Ref Range   Cholesterol 136 0 - 200 mg/dL   Triglycerides 117 <150 mg/dL   HDL 37 (L) >40 mg/dL   Total CHOL/HDL Ratio 3.7 RATIO   VLDL 23 0 - 40 mg/dL   LDL Cholesterol 76 0 - 99 mg/dL    Comment:        Total Cholesterol/HDL:CHD Risk Coronary Heart Disease Risk Table                     Men   Women  1/2 Average Risk   3.4   3.3  Average Risk       5.0   4.4  2 X Average Risk   9.6   7.1  3 X Average Risk  23.4   11.0        Use the calculated Patient Ratio above and the CHD Risk Table to determine the patient's CHD Risk.        ATP III CLASSIFICATION (LDL):  <100     mg/dL   Optimal  100-129  mg/dL   Near or Above                    Optimal  130-159  mg/dL   Borderline  160-189  mg/dL   High  >190     mg/dL   Very High Performed at [place] [address], [place], [place] [address]   Prolactin     Status: Abnormal   Collection Time: 02/17/19  6:33 AM  Result Value Ref Range   Prolactin 31.9 (H) 4.8 - 23.3 ng/mL    Comment: (NOTE) Performed At: [facility] [place], [place] [id number] Rush Farmer MD [id number]   TSH     Status: None   Collection Time: 02/17/19  6:33 AM  Result Value Ref Range   TSH 0.864 0.350 - 4.500 uIU/mL    Comment: Performed by a 3rd  Generation assay with a functional sensitivity of <=0.01 uIU/mL. Performed at [facility], [place] [address], [place],  [address]   Urine rapid drug screen (hosp performed)not at [facility]     Status: Abnormal   Collection Time: 02/17/19 10:14 AM  Result Value Ref Range   Opiates NONE DETECTED NONE DETECTED   Cocaine NONE DETECTED NONE DETECTED   Benzodiazepines NONE DETECTED NONE DETECTED   Amphetamines POSITIVE (A) NONE DETECTED   Tetrahydrocannabinol POSITIVE (A) NONE DETECTED   Barbiturates NONE DETECTED NONE DETECTED  Comment: (NOTE) DRUG SCREEN FOR MEDICAL PURPOSES ONLY.  IF CONFIRMATION IS NEEDED FOR ANY PURPOSE, NOTIFY LAB WITHIN 5 DAYS. LOWEST DETECTABLE LIMITS FOR URINE DRUG SCREEN Drug Class                     Cutoff (ng/mL) Amphetamine and metabolites    1000 Barbiturate and metabolites    200 Benzodiazepine                 599 Tricyclics and metabolites     300 Opiates and metabolites        300 Cocaine and metabolites        300 THC                            50 Performed at [facility], [place] [address] [place], [place] [address]     Blood Alcohol level:  Lab Results  Component Value Date   ETH <10 02/17/2019   ETH 160 (H) 77/93/9030    Metabolic Disorder Labs: Lab Results  Component Value Date   HGBA1C 5.4 02/17/2019   MPG 108.28 02/17/2019   MPG 94 09/25/2016   Lab Results  Component Value Date   PROLACTIN 31.9 (H) 02/17/2019   PROLACTIN 29.6 (H) 09/25/2016   Lab Results  Component Value Date   CHOL 136 02/17/2019   TRIG 117 02/17/2019   HDL 37 (L) 02/17/2019   CHOLHDL 3.7 02/17/2019   VLDL 23 02/17/2019   LDLCALC 76 02/17/2019   LDLCALC 64 01/07/2018    Physical Findings: AIMS:  , ,  ,  ,    CIWA:  CIWA-Ar Total: 1 COWS:     Musculoskeletal: Strength & Muscle Tone: within normal limits Gait & Station: normal Patient leans: N/A  Psychiatric Specialty Exam: Physical Exam  ROS no chest  pain, no cough, no dyspnea, (+) nausea/vomiting x 1   Blood pressure 113/71, pulse 68, temperature 98.3 F (36.8 C), temperature source Oral, resp. rate 18, height '5\' 3"'$  (1.6 m), weight 61.2 kg, SpO2 100 %.Body mass index is 23.9 kg/m.  General Appearance: Well Groomed  Eye Contact:  Good  Speech:  Normal Rate  Volume:  Normal  Mood:  some improvement compared to admission, still vaguely anxious/constricted  Affect:  Congruent  Thought Process:  Linear and Descriptions of Associations: Intact  Orientation:  Full (Time, Place, and Person)  Thought Content:  no hallucinations, no delusions, not internally preoccupied   Suicidal Thoughts:  No currently denies suicidal or self injurious ideations, denies homicidal or violent ideations  Homicidal Thoughts:  No  Memory:  recent and remote grossly intact   Judgement:  Fair/ improving  Insight:  Fair/ improving  Psychomotor Activity:  Normal  Concentration:  Concentration: Good and Attention Span: Good  Recall:  Good  Fund of Knowledge:  Good  Language:  Good  Akathisia:  Negative  Handed:  Right  AIMS (if indicated):     Assets:  Desire for Improvement Resilience  ADL's:  Intact  Cognition:  WNL  Sleep:  Number of Hours: 6.75   Assessment -  18 year old female, presented for worsening depression, anxiety, passive SI, neuro-vegetative symptoms. History of opiate dependence, states abstinent from opiates x 2 months. Reports recent methamphetamine and BZD ( Xanax) use , although irregularly, and states she last used Xanax more than a week ago.  Patient is presenting with partially improved mood but remains vaguely anxious/apprhensive  and depressed. Denies SI. Denies medication side effects. Does report history of intermittent nausea and an episode of vomiting earlier today, but does not currently think this was related to medications, as she has had symptoms prior to onset of med trial and as she has tolerated Prozac well in the past .  Neurontin is a new medication for patient started today so will discontinue for now. Endorses some reflux/GERD symptoms . 8/4 pregnancy test is negative.  Treatment Plan Summary: Daily contact with patient to assess and evaluate symptoms and progress in treatment, Medication management, Plan inpatient treatment  and medications as below Encourage group and milieu participation  Encourage efforts to maintain sobriety/abstinence  Start Protonix 20 mgrs QDAY for GERD symptoms Continue Prozac 20 mgrs QDAY for depression.- *patient reports she vomited soon after taking AM medications and that " the Prozac came right back up". Does not feel episode of vomiting was related to Prozac as has been well tolerated in the past . Therefore will order Prozac 20 mgrs X1dose.   D/C Neurontin - see above  Continue Trazodone 50 mgrs QHS PRN for insomnia Continue Ativan PRN for WDL as needed or for anxiety as needed  Treatment team working on disposition planning options Jenne Campus, MD 02/18/2019, 10:54 AM

## 2019-02-18 NOTE — Plan of Care (Signed)
Progress note  D: pt found in bed; compliant with medication administration. Pt presents guarded and minimal. Pt complains of anxiety. Pt has been viewed in the milieu and in the courtyard interacting appropriately with other patients. Pt denies any other complaints or pain to this Probation officer. Pt denies si/hi/ah/vh and verbally agrees to approach staff if these become apparent or before harming himself/others while at [place]: Pt provided support and encouragement. Pt given medication per protocol and standing orders. Q72m safety checks implemented and continued.  R: Pt safe on the unit. Will continue to monitor.  Pt progressing in the following metrics  Problem: Education: Goal: Knowledge of [place] [facility] Education information/materials will improve Outcome: Progressing Goal: Emotional status will improve Outcome: Progressing Goal: Mental status will improve Outcome: Progressing Goal: Verbalization of understanding the information provided will improve Outcome: Progressing

## 2019-02-19 MED ORDER — NICOTINE POLACRILEX 2 MG MT GUM: 2.0000 mg | CHEWING_GUM | OROMUCOSAL | 0 refills | Status: DC | PRN

## 2019-02-19 MED ORDER — HYDROXYZINE HCL 25 MG PO TABS
25.0000 mg | ORAL_TABLET | Freq: Once | ORAL | Status: AC
  Administered 2019-02-19: 25 mg via ORAL
  Filled 2019-02-19: qty 1

## 2019-02-19 MED ORDER — HYDROXYZINE HCL 25 MG PO TABS: 25.0000 mg | ORAL_TABLET | Freq: Three times a day (TID) | ORAL | Status: DC | PRN

## 2019-02-19 MED ORDER — HYDROXYZINE HCL 25 MG PO TABS: 25.0000 mg | ORAL_TABLET | Freq: Three times a day (TID) | ORAL | 0 refills | Status: DC | PRN

## 2019-02-19 MED ORDER — FLUOXETINE HCL 20 MG PO CAPS: 20.0000 mg | ORAL_CAPSULE | Freq: Every day | ORAL | 0 refills | Status: DC

## 2019-02-19 MED ORDER — HYDROXYZINE HCL 25 MG PO TABS
ORAL_TABLET | ORAL | Status: AC
  Administered 2019-02-19: 11:00:00 25 mg via ORAL
  Filled 2019-02-19: qty 1

## 2019-02-19 NOTE — [facility] Suicide Risk Assessment (Signed)
[facility] Discharge Suicide Risk Assessment   Principal Problem: depression Discharge Diagnoses: Active Problems:   MDD (major depressive disorder), recurrent, severe, with psychosis ([place])   Total Time spent with patient: 30 minutes  Musculoskeletal: Strength & Muscle Tone: within normal limits Gait & Station: normal Patient leans: N/A  Psychiatric Specialty Exam: ROS denies chest pain or shortness of breath, no cough,no fever or chills, reports she has had no further episodes of nausea or vomiting and has tolerated PO intake well   Blood pressure 106/72, pulse (!) 105, temperature 98.4 F (36.9 C), temperature source Oral, resp. rate 18, height 5\' 3"  (1.6 m), weight 61.2 kg, SpO2 100 %.Body mass index is 23.9 kg/m.  General Appearance: Well Groomed  Eye Contact::  Good  Speech:  Normal Rate409  Volume:  Normal  Mood:  reports her mood is " a lot better"  Affect:  reactive, appropriate, vaguely anxious  Thought Process:  Linear and Descriptions of Associations: Intact  Orientation:  Full (Time, Place, and Person)  Thought Content:  no hallucinations,no delusions  Suicidal Thoughts:  No denies suicidal or self injurious ideations, denies homicidal or violent ideations  Homicidal Thoughts:  No  Memory:  recent and remote grossly intact   Judgement:  Other:  improving   Insight:  improving   Psychomotor Activity:  Normal  Concentration:  Good  Recall:  Good  Fund of Knowledge:Good  Language: Good  Akathisia:  Negative  Handed:  Right  AIMS (if indicated):     Assets:  Communication Skills Desire for Improvement Resilience  Sleep:  Number of Hours: 6.5  Cognition: WNL  ADL's:  Intact   Mental Status Per Nursing Assessment::   On Admission:  NA  Demographic Factors:  18, single, no children  Loss Factors: Substance abuse / unstable housing  Historical Factors: Two prior psychiatric admissions , history of depression and of substance abuse   Risk Reduction Factors:    Sense of responsibility to family, Living with another person, especially a relative and Positive coping skills or problem solving skills  Continued Clinical Symptoms:  At this time patient presents alert, attentive, well groomed, describes mood as improved, affect more reactive, no thought disorder, no SI or HI, no psychotic symptoms, future oriented. States she hopes she will be able to go to college in the near future and eventually intends to become an Therapist, sports. She reports she plans to go live with her father for a period of a few weeks until she can find more stable housing , also states father will help her by safeguarding her money for her in order to decrease risk of relapsing .  With her express consent I spoke with father on phone, who corroborates patient seems better and can stay with him for a period of time, although pointed out that house is a 2 bedroom house and there are already three people living there. Patient expresses understanding of this housing situation. No disruptive or agitated behaviors on unit. Tolerating medications well, has had no further episodes of nausea or vomiting and currently is tolerating Prozac well . Appetite described as normal. We reviewed side effects to include potential risk of increased SI early in treatment with antidepressants in young adults   Cognitive Features That Contribute To Risk:  No gross cognitive deficits noted upon discharge. Is alert , attentive, and oriented x 3   Suicide Risk:  Mild:  Suicidal ideation of limited frequency, intensity, duration, and specificity.  There are no identifiable plans, no  associated intent, mild dysphoria and related symptoms, good self-control (both objective and subjective assessment), few other risk factors, and identifiable protective factors, including available and accessible social support.  Follow-up Information    Services, [facility]. Go on 02/23/2019.   Why: Your hospital discharge appointment  will be held by phone on 02/23/2019 at 11:30am. Please complete paperwork in office prior to your appointment. Please bring hospital discharge paperwork, photo ID, and proof of income or insurance.  Contact information: [place] [address] [phone]        [facility] Call.   Specialty: Addiction Medicine Why: A referral for residential treatment was faxed on your behalf on 08/05. To follow up with your referral, please call [phone] and ask to speak with "Shayla," intake coordinator. Contact information: [place] [address] [phone]           Plan Of Care/Follow-up recommendations:  Activity:  as tolerated  Diet:  regular Tests:  NA Other:  See below Patient is expressing readiness for discharge and there are no current grounds for involuntary commitment  Leaving unit in good spirits Plans to go live with her father, as above  Jenne Campus, MD 02/19/2019, 9:57 AM
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
## 2019-02-19 NOTE — Progress Notes (Signed)
Patient ID: Julia [name], female   DOB: [birth year], 18 y.o.   MRN: [id number]  Discharge Note  Patient denies SI/HI and states readiness for discharge.  Written and verbal discharge instructions reviewed with the patient. Patient accepting to information and verbalized understanding with no concerns. All belongings returned to patient from the unit and secured lockers.  Patient was safely escorted to the lobby for discharge.

## 2019-02-19 NOTE — Progress Notes (Signed)
  [facility] Adult Case Management Discharge Plan :  Will you be returning to the same living situation after discharge:  Yes,  dad's house. At discharge, do you have transportation home?: Yes,  Dad will pick up after noon. Do you have the ability to pay for your medications: No. Referred to [facility].   Release of information consent forms completed and in the chart.  Patient to Follow up at: Follow-up Information    [facility]. Go on 02/23/2019.   Why: Your hospital discharge appointment will be held by phone on 02/23/2019 at 11:30am. Please complete paperwork in office prior to your appointment. Please bring hospital discharge paperwork, photo ID, and proof of income or insurance.  Contact information: [place] [address] [place] [address] [phone]        [facility] Call.   Specialty: Addiction Medicine Why: A referral for residential treatment was faxed on your behalf on 08/05. To follow up with your referral, please call [phone] and ask to speak with "Shayla," intake coordinator. Contact information: [place] [address] [phone]           Next level of care provider has access to [place] and Suicide Prevention discussed: Yes,  with patient.   Has patient been referred to the Quitline?: Patient refused referral  Patient has been referred for addiction treatment: Yes  Joellen Jersey, [place] 02/19/2019, 10:18 AM

## 2019-03-02 ENCOUNTER — Emergency Department (HOSPITAL_COMMUNITY): Payer: Self-pay

## 2019-03-02 ENCOUNTER — Other Ambulatory Visit: Payer: Self-pay

## 2019-03-02 ENCOUNTER — Emergency Department (HOSPITAL_COMMUNITY)
Admission: EM | Admit: 2019-03-02 | Discharge: 2019-03-02 | Disposition: A | Discharge status: Home / Self Care | Payer: Self-pay | Attending: Emergency Medicine | Admitting: Emergency Medicine

## 2019-03-02 ENCOUNTER — Encounter (HOSPITAL_COMMUNITY): Payer: Self-pay

## 2019-03-02 DIAGNOSIS — N730 Acute parametritis and pelvic cellulitis: Secondary | ICD-10-CM | POA: Insufficient documentation

## 2019-03-02 DIAGNOSIS — A5901 Trichomonal vulvovaginitis: Secondary | ICD-10-CM | POA: Insufficient documentation

## 2019-03-02 DIAGNOSIS — D259 Leiomyoma of uterus, unspecified: Secondary | ICD-10-CM | POA: Insufficient documentation

## 2019-03-02 DIAGNOSIS — R52 Pain, unspecified: Secondary | ICD-10-CM

## 2019-03-02 DIAGNOSIS — N3000 Acute cystitis without hematuria: Secondary | ICD-10-CM | POA: Insufficient documentation

## 2019-03-02 DIAGNOSIS — Z79899 Other long term (current) drug therapy: Secondary | ICD-10-CM | POA: Insufficient documentation

## 2019-03-02 DIAGNOSIS — F17210 Nicotine dependence, cigarettes, uncomplicated: Secondary | ICD-10-CM | POA: Insufficient documentation

## 2019-03-02 LAB — BASIC METABOLIC PANEL
Anion gap: 8 (ref 5–15)
BUN: 10 mg/dL (ref 6–20)
CO2: 24 mmol/L (ref 22–32)
Calcium: 9.2 mg/dL (ref 8.9–10.3)
Chloride: 108 mmol/L (ref 98–111)
Creatinine, Ser: 0.77 mg/dL (ref 0.44–1.00)
GFR calc Af Amer: >60 mL/min (ref >60)
GFR calc non Af Amer: >60 mL/min (ref >60)
Glucose, Bld: 97 mg/dL (ref 70–99)
Potassium: 3.9 mmol/L (ref 3.5–5.1)
Sodium: 140 mmol/L (ref 135–145)

## 2019-03-02 LAB — CBC WITH DIFFERENTIAL/PLATELET
Abs Immature Granulocytes: 0.05 10*3/uL (ref 0.00–0.07)
Basophils Absolute: 0.1 10*3/uL (ref 0.0–0.1)
Basophils Relative: 0 %
Eosinophils Absolute: 0.1 10*3/uL (ref 0.0–0.5)
Eosinophils Relative: 1 %
HCT: 41.1 % (ref 36.0–46.0)
Hemoglobin: 13 g/dL (ref 12.0–15.0)
Immature Granulocytes: 0 %
Lymphocytes Relative: 29 %
Lymphs Abs: 3.4 10*3/uL (ref 0.7–4.0)
MCH: 28.6 pg (ref 26.0–34.0)
MCHC: 31.6 g/dL (ref 30.0–36.0)
MCV: 90.5 fL (ref 80.0–100.0)
Monocytes Absolute: 0.7 10*3/uL (ref 0.1–1.0)
Monocytes Relative: 6 %
Neutro Abs: 7.5 10*3/uL (ref 1.7–7.7)
Neutrophils Relative %: 64 %
Platelets: 278 10*3/uL (ref 150–400)
RBC: 4.54 MIL/uL (ref 3.87–5.11)
RDW: 13.7 % (ref 11.5–15.5)
WBC: 11.9 10*3/uL — ABNORMAL HIGH (ref 4.0–10.5)
nRBC: 0 % (ref 0.0–0.2)

## 2019-03-02 LAB — URINALYSIS, ROUTINE W REFLEX MICROSCOPIC
Bilirubin Urine: NEGATIVE
Glucose, UA: NEGATIVE mg/dL
Hgb urine dipstick: NEGATIVE
Ketones, ur: NEGATIVE mg/dL
Nitrite: NEGATIVE
Protein, ur: 30 mg/dL — AB
Specific Gravity, Urine: 1.016 (ref 1.005–1.030)
WBC, UA: >50 WBC/hpf — ABNORMAL HIGH (ref 0–5)
pH: 7 (ref 5.0–8.0)

## 2019-03-02 LAB — WET PREP, GENITAL
Sperm: NONE SEEN
Yeast Wet Prep HPF POC: NONE SEEN

## 2019-03-02 LAB — POC URINE PREG, ED: Preg Test, Ur: NEGATIVE

## 2019-03-02 MED ORDER — KETOROLAC TROMETHAMINE 30 MG/ML IJ SOLN
30.0000 mg | Freq: Once | INTRAMUSCULAR | Status: AC
  Administered 2019-03-02: 30 mg via INTRAVENOUS
  Filled 2019-03-02: qty 1

## 2019-03-02 MED ORDER — IBUPROFEN 600 MG PO TABS: 600.0000 mg | ORAL_TABLET | Freq: Four times a day (QID) | ORAL | 0 refills | Status: DC | PRN

## 2019-03-02 MED ORDER — CEPHALEXIN 500 MG PO CAPS: 500.0000 mg | ORAL_CAPSULE | Freq: Three times a day (TID) | ORAL | 0 refills | Status: AC

## 2019-03-02 MED ORDER — METRONIDAZOLE 500 MG PO TABS
500.0000 mg | ORAL_TABLET | Freq: Once | ORAL | Status: AC
  Administered 2019-03-02: 500 mg via ORAL
  Filled 2019-03-02: qty 1

## 2019-03-02 MED ORDER — LIDOCAINE HCL (PF) 1 % IJ SOLN
INTRAMUSCULAR | Status: AC
  Administered 2019-03-02: 1 mL
  Filled 2019-03-02: qty 2

## 2019-03-02 MED ORDER — DOXYCYCLINE HYCLATE 100 MG PO CAPS: 100.0000 mg | ORAL_CAPSULE | Freq: Two times a day (BID) | ORAL | 0 refills | Status: AC

## 2019-03-02 MED ORDER — DOXYCYCLINE HYCLATE 100 MG PO TABS
100.0000 mg | ORAL_TABLET | Freq: Once | ORAL | Status: AC
  Administered 2019-03-02: 100 mg via ORAL
  Filled 2019-03-02: qty 1

## 2019-03-02 MED ORDER — METRONIDAZOLE 500 MG PO TABS: 500.0000 mg | ORAL_TABLET | Freq: Two times a day (BID) | ORAL | 0 refills | Status: AC

## 2019-03-02 MED ORDER — CEFTRIAXONE SODIUM 250 MG IJ SOLR
250.0000 mg | Freq: Once | INTRAMUSCULAR | Status: AC
  Administered 2019-03-02: 250 mg via INTRAMUSCULAR
  Filled 2019-03-02: qty 250

## 2019-03-02 MED ORDER — SODIUM CHLORIDE 0.9 % IV BOLUS
1000.0000 mL | Freq: Once | INTRAVENOUS | Status: AC
  Administered 2019-03-02: 1000 mL via INTRAVENOUS

## 2019-03-02 NOTE — ED Provider Notes (Signed)
[facility] EMERGENCY DEPARTMENT Provider Note   CSN: [id number] Arrival date & time: 03/02/19  1540    History   Chief Complaint Chief Complaint  Patient presents with   Urinary Frequency    HPI Julia [name] is a 18 y.o. female.     Julia [name] is a 18 y.o. female with a history of bipolar disorder, polysubstance abuse, migraines and dysmenorrhea, who presents to the emergency department for evaluation of 3 to 4 days of dysuria.  Patient reports that for the past few days she has had burning and discomfort when she pees as well as urinary frequency.  She denies associated flank pain but does report some lower abdominal discomfort.  Patient also reports that she thinks she may have a yeast infection because she has noted some yellow vaginal charge.  Patient initially reports this is all been going on for a few days, but later reports that she has been having this discharge intermittently for a year associated with worsening lower abdominal discomfort and dyspareunia.  She has had 3 sexual partners, and reports that she does not consistently use protection.  She denies any associated fevers or chills, no nausea or vomiting.  No diarrhea or constipation, no blood in the stool.  She is unsure if any of her partners have had STIs or symptoms.     Past Medical History:  Diagnosis Date   Burning with urination 05/03/2015   Contraceptive management 05/03/2015   Depression    Dysmenorrhea 12/30/2013   Heroin addiction ([place])    Menorrhagia 12/30/2013   Menstrual extraction 12/30/2013   Migraines    Social anxiety disorder 09/25/2016   Vaginal odor 05/03/2015    Patient Active Problem List   Diagnosis Date Noted   MDD (major depressive disorder), recurrent, severe, with psychosis ([place]) 02/16/2019   Bipolar 1 disorder, depressed, severe ([place]) 01/06/2018   Polysubstance dependence including opioid type drug, continuous use ([place]) 01/06/2018   Petechiae 10/09/2017    Social anxiety disorder 09/25/2016   MDD (major depressive disorder), recurrent episode ([place]) 09/24/2016   Substance induced mood disorder ([place]) 04/29/2016   Depressed mood 04/17/2016   Polysubstance abuse ([place])    Recurrent UTI (urinary tract infection) postcoital 08/21/2015   Dysmenorrhea 12/30/2013    Past Surgical History:  Procedure Laterality Date   NO PAST SURGERIES       OB History    Gravida  0   Para      Term      Preterm      AB      Living        SAB      TAB      Ectopic      Multiple      Live Births               Home Medications    Prior to Admission medications   Medication Sig Start Date End Date Taking? Authorizing Provider  cephALEXin (KEFLEX) 500 MG capsule Take 1 capsule (500 mg total) by mouth 3 (three) times daily for 7 days. 03/02/19 03/09/19  Jacqlyn Larsen, PA-C  doxycycline (VIBRAMYCIN) 100 MG capsule Take 1 capsule (100 mg total) by mouth 2 (two) times daily for 14 days. One po bid x 7 days 03/02/19 03/16/19  Jacqlyn Larsen, PA-C  etonogestrel (NEXPLANON) 68 MG IMPL implant 1 each (68 mg total) by Subdermal route once for 1 dose. (Birth control method) 01/08/18 01/08/18  Encarnacion Slates,  NP  FLUoxetine (PROZAC) 20 MG capsule Take 1 capsule (20 mg total) by mouth daily. 02/20/19   Connye Burkitt, NP  hydrOXYzine (ATARAX/VISTARIL) 25 MG tablet Take 1 tablet (25 mg total) by mouth 3 (three) times daily as needed for anxiety. 02/19/19   Connye Burkitt, NP  ibuprofen (ADVIL) 600 MG tablet Take 1 tablet (600 mg total) by mouth every 6 (six) hours as needed. 03/02/19   Jacqlyn Larsen, PA-C  metroNIDAZOLE (FLAGYL) 500 MG tablet Take 1 tablet (500 mg total) by mouth 2 (two) times daily for 14 days. One po bid x 7 days 03/02/19 03/16/19  Jacqlyn Larsen, PA-C  nicotine polacrilex (NICORETTE) 2 MG gum Take 1 each (2 mg total) by mouth as needed for smoking cessation. 02/19/19   Connye Burkitt, NP    Family History Family History  Problem Relation Age  of Onset   Depression Mother    Hypertension Father    Hyperlipidemia Father    Cancer Paternal Grandmother        breast, uterine   Cirrhosis Paternal Grandfather        due to alcohol    Social History Social History   Tobacco Use   Smoking status: Current Every Day Smoker    Packs/day: 0.25    Types: Cigarettes   Smokeless tobacco: Never Used   Tobacco comment: parents smoke   Substance Use Topics   Alcohol use: Yes   Drug use: Yes    Types: Marijuana, Cocaine, Oxycodone    Comment: opitates, xanax, canned air     Allergies   Patient has no known allergies.   Review of Systems Review of Systems  Constitutional: Negative for chills and fever.  HENT: Negative.   Respiratory: Negative for cough and shortness of breath.   Cardiovascular: Negative for chest pain.  Gastrointestinal: Positive for abdominal pain. Negative for blood in stool, constipation, diarrhea, nausea and vomiting.  Genitourinary: Positive for dysuria, frequency, pelvic pain and vaginal discharge. Negative for flank pain, hematuria and vaginal bleeding.  Musculoskeletal: Negative for arthralgias and myalgias.  Skin: Negative for color change and rash.  Neurological: Negative for dizziness, syncope, light-headedness and headaches.     Physical Exam Updated Vital Signs BP 114/83 (BP Location: Right Arm)    Pulse 79    Temp 98.1 F (36.7 C) (Oral)    Resp 12    Ht 5\' 3"  (1.6 m)    Wt 59 kg    SpO2 100%    BMI 23.03 kg/m   Physical Exam Vitals signs and nursing note reviewed.  Constitutional:      General: She is not in acute distress.    Appearance: Normal appearance. She is well-developed and normal weight. She is not ill-appearing or diaphoretic.  HENT:     Head: Normocephalic and atraumatic.     Mouth/Throat:     Mouth: Mucous membranes are moist.     Pharynx: Oropharynx is clear.  Eyes:     General:        Right eye: No discharge.        Left eye: No discharge.     Pupils:  Pupils are equal, round, and reactive to light.  Neck:     Musculoskeletal: Neck supple.  Cardiovascular:     Rate and Rhythm: Normal rate and regular rhythm.     Heart sounds: Normal heart sounds.  Pulmonary:     Effort: Pulmonary effort is normal. No respiratory distress.  Breath sounds: Normal breath sounds. No wheezing or rales.     Comments: Respirations equal and unlabored, patient able to speak in full sentences, lungs clear to auscultation bilaterally Abdominal:     General: Bowel sounds are normal. There is no distension.     Palpations: Abdomen is soft. There is no mass.     Tenderness: There is abdominal tenderness. There is no guarding.     Comments: Abdomen is soft, nondistended, bowel sounds present throughout, patient has tenderness across the lower abdomen without guarding or peritoneal signs.  No CVA tenderness bilaterally.  Genitourinary:    Comments: Chaperone present during pelvic exam. No external genital lesions noted. Speculum exam reveals large amount of yellow discharge, with some erythema of the cervix, no bleeding. Bimanual exam with significant discomfort throughout there is some motion tenderness left adnexal tenderness without palpable masses. Musculoskeletal:        General: No deformity.  Skin:    General: Skin is warm and dry.     Capillary Refill: Capillary refill takes less than 2 seconds.  Neurological:     Mental Status: She is alert.     Coordination: Coordination normal.     Comments: Speech is clear, able to follow commands Moves extremities without ataxia, coordination intact  Psychiatric:        Mood and Affect: Mood normal.        Behavior: Behavior normal.      ED Treatments / Results  Labs (all labs ordered are listed, but only abnormal results are displayed) Labs Reviewed  WET PREP, GENITAL - Abnormal; Notable for the following components:      Result Value   Trich, Wet Prep PRESENT (*)    Clue Cells Wet Prep HPF POC PRESENT  (*)    WBC, Wet Prep HPF POC MANY (*)    All other components within normal limits  URINALYSIS, ROUTINE W REFLEX MICROSCOPIC - Abnormal; Notable for the following components:   APPearance CLOUDY (*)    Protein, ur 30 (*)    Leukocytes,Ua LARGE (*)    WBC, UA >50 (*)    Bacteria, UA RARE (*)    All other components within normal limits  CBC WITH DIFFERENTIAL/PLATELET - Abnormal; Notable for the following components:   WBC 11.9 (*)    All other components within normal limits  URINE CULTURE  BASIC METABOLIC PANEL  RPR  HIV ANTIBODY (ROUTINE TESTING W REFLEX)  POC URINE PREG, ED  GC/CHLAMYDIA PROBE AMP ([place]) NOT AT [facility]    EKG None  Radiology US Pelvic Doppler (torsion R/o Or Mass Arterial Flow)  Result Date: 03/02/2019 CLINICAL DATA:  Initial evaluation for acute pelvic pain. EXAM: TRANSABDOMINAL ULTRASOUND OF PELVIS DOPPLER ULTRASOUND OF OVARIES TECHNIQUE: Transabdominal ultrasound examination of the pelvis was performed including evaluation of the uterus, ovaries, adnexal regions, and pelvic cul-de-sac. Color and duplex Doppler ultrasound was utilized to evaluate blood flow to the ovaries. COMPARISON:  None available. FINDINGS: Uterus Measurements: 6.6 x 2.1 x 4.1 cm. 2.2 x 1.8 x 1.7 cm probable intramural fibroid at the lower uterine segment. Endometrium Thickness: 5.2 mm.  No focal abnormality visualized. Right ovary Measurements: 3.5 x 2.0 x 3.2 cm = volume: 12.2 mL. Normal appearance/no adnexal mass. Left ovary Measurements: 2.7 x 1.6 x 2.6 cm = volume: 6.1 mL. Normal appearance/no adnexal mass. Pulsed Doppler evaluation demonstrates normal low-resistance arterial and venous waveforms in both ovaries. Other: No free fluid seen within the pelvis. IMPRESSION: 1. No acute abnormality  identified within the pelvis. No evidence for torsion. 2. 2.2 cm probable intramural fibroid at the lower uterine segment. Electronically Signed   By: Jeannine Boga M.D.   On: 03/02/2019 20:35      Procedures Procedures (including critical care time)  Medications Ordered in ED Medications  ketorolac (TORADOL) 30 MG/ML injection 30 mg (30 mg Intravenous Given 03/02/19 1730)  sodium chloride 0.9 % bolus 1,000 mL (0 mLs Intravenous Stopped 03/02/19 1830)  cefTRIAXone (ROCEPHIN) injection 250 mg (250 mg Intramuscular Given 03/02/19 2045)  doxycycline (VIBRA-TABS) tablet 100 mg (100 mg Oral Given 03/02/19 2045)  metroNIDAZOLE (FLAGYL) tablet 500 mg (500 mg Oral Given 03/02/19 2045)  lidocaine (PF) (XYLOCAINE) 1 % injection (1 mL  Given 03/02/19 2047)     Initial Impression / Assessment and Plan / ED Course  I have reviewed the triage vital signs and the nursing notes.  Pertinent labs & imaging results that were available during my care of the patient were reviewed by me and considered in my medical decision making (see chart for details).  18 year old female presents with 3 to 4 days of dysuria as well as vaginal discharge and pelvic discomfort.  On arrival she is afebrile with normal vitals.  On exam she has tenderness across the lower abdomen without peritoneal signs.  Pelvic exam reveals large amount of yellow discharge with significant discomfort throughout pelvic exam concerning for PID, she did have some focal left adnexal tenderness.  Will check basic labs, STD testing sent, and will get pelvic ultrasound.  Urinalysis obtained from triage which does show signs of infection, she has no CVA tenderness, this could very likely be from vaginal discharge but given urinary symptoms we will treat for UTI as well.  Pt with leukocytosis of 11.9, normal hemoglobin, no acute electrolyte derangements.  Urinalysis with signs of infection.  Wet prep with many WBCs and some clue cells, as well as trich.   Pelvic ultrasound shows no evidence of TOA or ovarian torsion, there is a 2.2 cm uterine fibroid.  We will plan to treat for urinary tract infection and PID.  PID likely caused by trichomonas.  Will  treat with Rocephin, doxycycline and Flagyl.  Keflex for UTI.  Motrin and Tylenol as needed for pain.  Strict return precautions discussed.  OB/GYN follow-up encouraged.  Patient expresses understanding and agreement with plan.  Counseled patient to avoid sexual intercourse until she has completed entire course of antibiotics, and symptoms have resolved.  Partners will need to be tested and treated as well.  Final Clinical Impressions(s) / ED Diagnoses   Final diagnoses:  PID (acute pelvic inflammatory disease)  Uterine leiomyoma, unspecified location  Acute cystitis without hematuria  Trichomonal vaginitis    ED Discharge Orders         Ordered    doxycycline (VIBRAMYCIN) 100 MG capsule  2 times daily     03/02/19 2045    metroNIDAZOLE (FLAGYL) 500 MG tablet  2 times daily     03/02/19 2045    cephALEXin (KEFLEX) 500 MG capsule  3 times daily     03/02/19 2045    ibuprofen (ADVIL) 600 MG tablet  Every 6 hours PRN     03/02/19 2045           Jacqlyn Larsen, PA-C 03/05/19 1422    Daleen Bo, MD 03/08/19 2308

## 2019-03-02 NOTE — Discharge Instructions (Signed)
You have an infection called pelvic inflammatory disease.  It is extremely important that you take all antibiotics as directed and complete entire course.  Take Flagyl and doxycycline both twice daily for 2 weeks to treat for PID.  Take Keflex 3 times daily for 1 week to treat UTI.  Take antibiotics with food, take with yogurt or with an over-the-counter probiotic supplement to help prevent diarrhea and stomach upset.  Do not drink alcohol while taking these antibiotics as it can cause severe vomiting.  Do not have sex until you have completely completed antibiotic course and discharge has resolved.  After this make sure you are using protection.  Please follow-up with OB/GYN, if you have fevers, worsening abdominal pain, discharge, urinary symptoms or any other new or concerning symptoms please return to the emergency department.

## 2019-03-02 NOTE — ED Triage Notes (Signed)
Pt believes she has a UTI and yeast infection. Is having dysuria. Symptoms have lasted 3-4 days.

## 2019-03-02 NOTE — ED Notes (Signed)
Pt in ultrasound with chaperone,

## 2019-03-03 LAB — HIV ANTIBODY (ROUTINE TESTING W REFLEX): HIV Screen 4th Generation wRfx: NONREACTIVE

## 2019-03-04 LAB — URINE CULTURE: Culture: >=100000 — AB

## 2019-03-04 LAB — GC/CHLAMYDIA PROBE AMP (~~LOC~~) NOT AT ARMC
Chlamydia: NEGATIVE
Neisseria Gonorrhea: NEGATIVE

## 2019-03-04 LAB — RPR: RPR Ser Ql: NONREACTIVE

## 2019-03-05 ENCOUNTER — Telehealth: Payer: Self-pay | Admitting: *Deleted

## 2019-03-05 NOTE — Telephone Encounter (Signed)
Post ED Visit - Positive Culture Follow-up: Unsuccessful Patient Follow-up  Culture assessed and recommendations reviewed by:  []  Elenor Quinones, Pharm.D. []  Heide Guile, Pharm.D., BCPS AQ-ID []  Parks Neptune, Pharm.D., BCPS []  Alycia Rossetti, Pharm.D., BCPS []  [place], Pharm.D., BCPS, AAHIVP []  Legrand Como, Pharm.D., BCPS, AAHIVP []  Wynell Balloon, PharmD []  Vincenza Hews, PharmD, BCPS  Positive urine culture  []  Patient discharged without antimicrobial prescription and treatment is now indicated []  Organism is resistant to prescribed ED discharge antimicrobial []  Patient with positive blood cultures  Plan;  If no better stop Cephalexin and Start Bactrim DS 1 tab BID x 3 days  Unable to contact patient after 3 attempts, letter will be sent to address on file  Ardeen Fillers 03/05/2019, 9:31 AM

## 2019-05-14 ENCOUNTER — Encounter (HOSPITAL_COMMUNITY): Payer: Self-pay

## 2019-05-14 ENCOUNTER — Other Ambulatory Visit: Payer: Self-pay

## 2019-05-14 ENCOUNTER — Emergency Department (HOSPITAL_COMMUNITY)
Admission: EM | Admit: 2019-05-14 | Discharge: 2019-05-16 | Disposition: A | Discharge status: Other Acute Inpatient Hospital | Payer: Medicaid Other | Attending: Emergency Medicine | Admitting: Emergency Medicine

## 2019-05-14 DIAGNOSIS — Z79899 Other long term (current) drug therapy: Secondary | ICD-10-CM | POA: Insufficient documentation

## 2019-05-14 DIAGNOSIS — F419 Anxiety disorder, unspecified: Secondary | ICD-10-CM

## 2019-05-14 DIAGNOSIS — Z20828 Contact with and (suspected) exposure to other viral communicable diseases: Secondary | ICD-10-CM | POA: Insufficient documentation

## 2019-05-14 DIAGNOSIS — F17210 Nicotine dependence, cigarettes, uncomplicated: Secondary | ICD-10-CM | POA: Insufficient documentation

## 2019-05-14 DIAGNOSIS — R45851 Suicidal ideations: Secondary | ICD-10-CM | POA: Diagnosis not present

## 2019-05-14 DIAGNOSIS — F29 Unspecified psychosis not due to a substance or known physiological condition: Secondary | ICD-10-CM | POA: Diagnosis not present

## 2019-05-14 LAB — COMPREHENSIVE METABOLIC PANEL
ALT: 20 U/L (ref 0–44)
AST: 19 U/L (ref 15–41)
Albumin: 4.5 g/dL (ref 3.5–5.0)
Alkaline Phosphatase: 79 U/L (ref 38–126)
Anion gap: 8 (ref 5–15)
BUN: 9 mg/dL (ref 6–20)
CO2: 23 mmol/L (ref 22–32)
Calcium: 9.5 mg/dL (ref 8.9–10.3)
Chloride: 107 mmol/L (ref 98–111)
Creatinine, Ser: 0.72 mg/dL (ref 0.44–1.00)
GFR calc Af Amer: >60 mL/min (ref >60)
GFR calc non Af Amer: >60 mL/min (ref >60)
Glucose, Bld: 107 mg/dL — ABNORMAL HIGH (ref 70–99)
Potassium: 3.4 mmol/L — ABNORMAL LOW (ref 3.5–5.1)
Sodium: 138 mmol/L (ref 135–145)
Total Bilirubin: 0.5 mg/dL (ref 0.3–1.2)
Total Protein: 8 g/dL (ref 6.5–8.1)

## 2019-05-14 LAB — CBC WITH DIFFERENTIAL/PLATELET
Abs Immature Granulocytes: 0.03 10*3/uL (ref 0.00–0.07)
Basophils Absolute: 0.1 10*3/uL (ref 0.0–0.1)
Basophils Relative: 1 %
Eosinophils Absolute: 0.2 10*3/uL (ref 0.0–0.5)
Eosinophils Relative: 2 %
HCT: 45.4 % (ref 36.0–46.0)
Hemoglobin: 14.4 g/dL (ref 12.0–15.0)
Immature Granulocytes: 0 %
Lymphocytes Relative: 24 %
Lymphs Abs: 2.1 10*3/uL (ref 0.7–4.0)
MCH: 29.1 pg (ref 26.0–34.0)
MCHC: 31.7 g/dL (ref 30.0–36.0)
MCV: 91.9 fL (ref 80.0–100.0)
Monocytes Absolute: 0.5 10*3/uL (ref 0.1–1.0)
Monocytes Relative: 6 %
Neutro Abs: 5.9 10*3/uL (ref 1.7–7.7)
Neutrophils Relative %: 67 %
Platelets: 282 10*3/uL (ref 150–400)
RBC: 4.94 MIL/uL (ref 3.87–5.11)
RDW: 12.6 % (ref 11.5–15.5)
WBC: 8.7 10*3/uL (ref 4.0–10.5)
nRBC: 0 % (ref 0.0–0.2)

## 2019-05-14 LAB — ETHANOL: Alcohol, Ethyl (B): <10 mg/dL (ref <10)

## 2019-05-14 LAB — SALICYLATE LEVEL: Salicylate Lvl: <7 mg/dL (ref 2.8–30.0)

## 2019-05-14 NOTE — ED Provider Notes (Signed)
[facility] EMERGENCY DEPARTMENT Provider Note   CSN: [id number] Arrival date & time: 05/14/19  2027     History   Chief Complaint Chief Complaint  Patient presents with  . Anxiety    HPI Julia [name] is a 18 y.o. female.     HPI Patient presents with concern of anxiousness, suicidal ideation.  Patient notes a history of polysubstance abuse over the past few days, with racing thoughts, increasing anxiety, difficulty with sleeping, and worsening suicidal ideation. Patient notes a history of anxiety, and that over the past year, since the death of her mother, she has had worsening ability to function. Today, with concern for increased drug use over the past few days, worsening suicidal ideation, she presents with request for assistance. When asked which drugs she has been using, she replies all of them. She denies physical pain.  Past Medical History:  Diagnosis Date  . Burning with urination 05/03/2015  . Contraceptive management 05/03/2015  . Depression   . Dysmenorrhea 12/30/2013  . Heroin addiction ([place])   . Menorrhagia 12/30/2013  . Menstrual extraction 12/30/2013  . Migraines   . Social anxiety disorder 09/25/2016  . Vaginal odor 05/03/2015    Patient Active Problem List   Diagnosis Date Noted  . MDD (major depressive disorder), recurrent, severe, with psychosis ([place]) 02/16/2019  . Bipolar 1 disorder, depressed, severe ([place]) 01/06/2018  . Polysubstance dependence including opioid type drug, continuous use ([place]) 01/06/2018  . Petechiae 10/09/2017  . Social anxiety disorder 09/25/2016  . MDD (major depressive disorder), recurrent episode ([place]) 09/24/2016  . Substance induced mood disorder ([place]) 04/29/2016  . Depressed mood 04/17/2016  . Polysubstance abuse ([place])   . Recurrent UTI (urinary tract infection) postcoital 08/21/2015  . Dysmenorrhea 12/30/2013    Past Surgical History:  Procedure Laterality Date  . NO PAST SURGERIES       OB History    Gravida   0   Para      Term      Preterm      AB      Living        SAB      TAB      Ectopic      Multiple      Live Births               Home Medications    Prior to Admission medications   Medication Sig Start Date End Date Taking? Authorizing Provider  etonogestrel (NEXPLANON) 68 MG IMPL implant 1 each (68 mg total) by Subdermal route once for 1 dose. (Birth control method) 01/08/18 01/08/18  Lindell Spar I, NP  FLUoxetine (PROZAC) 20 MG capsule Take 1 capsule (20 mg total) by mouth daily. 02/20/19   Connye Burkitt, NP  hydrOXYzine (ATARAX/VISTARIL) 25 MG tablet Take 1 tablet (25 mg total) by mouth 3 (three) times daily as needed for anxiety. 02/19/19   Connye Burkitt, NP  ibuprofen (ADVIL) 600 MG tablet Take 1 tablet (600 mg total) by mouth every 6 (six) hours as needed. 03/02/19   Jacqlyn Larsen, PA-C  nicotine polacrilex (NICORETTE) 2 MG gum Take 1 each (2 mg total) by mouth as needed for smoking cessation. 02/19/19   Connye Burkitt, NP    Family History Family History  Problem Relation Age of Onset  . Depression Mother   . Hypertension Father   . Hyperlipidemia Father   . Cancer Paternal Grandmother        breast,  uterine  . Cirrhosis Paternal Grandfather        due to alcohol    Social History Social History   Tobacco Use  . Smoking status: Current Every Day Smoker    Packs/day: 0.25    Types: Cigarettes  . Smokeless tobacco: Never Used  . Tobacco comment: parents smoke   Substance Use Topics  . Alcohol use: Yes  . Drug use: Not Currently    Types: Marijuana, Cocaine, Oxycodone    Comment: opitates, xanax, canned air     Allergies   Abilify [aripiprazole] and Zoloft [sertraline hcl]   Review of Systems Review of Systems  Constitutional:       Per HPI, otherwise negative  HENT:       Per HPI, otherwise negative  Respiratory:       Per HPI, otherwise negative  Cardiovascular:       Per HPI, otherwise negative  Gastrointestinal: Negative for  vomiting.  Endocrine:       Negative aside from HPI  Genitourinary:       Neg aside from HPI   Musculoskeletal:       Per HPI, otherwise negative  Skin: Negative.   Neurological: Negative for syncope.  Psychiatric/Behavioral: Positive for dysphoric mood, sleep disturbance and suicidal ideas. The patient is nervous/anxious.      Physical Exam Updated Vital Signs BP (!) 145/87 (BP Location: Right Arm)   Pulse 96   Temp 98.1 F (36.7 C) (Oral)   Resp 20   Ht 5\' 3"  (1.6 m)   Wt 57.6 kg   SpO2 96%   BMI 22.50 kg/m   Physical Exam Vitals signs and nursing note reviewed.  Constitutional:      General: She is not in acute distress.    Appearance: She is well-developed.  HENT:     Head: Normocephalic and atraumatic.  Eyes:     Conjunctiva/sclera: Conjunctivae normal.  Cardiovascular:     Rate and Rhythm: Normal rate and regular rhythm.  Pulmonary:     Effort: Pulmonary effort is normal. No respiratory distress.     Breath sounds: Normal breath sounds. No stridor.  Abdominal:     General: There is no distension.  Skin:    General: Skin is warm and dry.  Neurological:     Mental Status: She is alert and oriented to person, place, and time.     Cranial Nerves: No cranial nerve deficit.      ED Treatments / Results  Labs (all labs ordered are listed, but only abnormal results are displayed) Labs Reviewed  SARS CORONAVIRUS 2 (TAT 6-24 HRS)  COMPREHENSIVE METABOLIC PANEL  SALICYLATE LEVEL  ETHANOL  RAPID URINE DRUG SCREEN, HOSP PERFORMED  CBC WITH DIFFERENTIAL/PLATELET  POC URINE PREG, ED    EKG None  Radiology No results found.  Procedures Procedures (including critical care time)  Medications Ordered in ED Medications - No data to display   Initial Impression / Assessment and Plan / ED Course  I have reviewed the triage vital signs and the nursing notes.  Pertinent labs & imaging results that were available during my care of the patient were reviewed  by me and considered in my medical decision making (see chart for details).  Young female with history of depression, anxiety, prior death of her mother presents with polysubstance abuse suicidal ideation.  Patient is awake, alert, hemodynamically unremarkable. Labs pending, but the patient is otherwise cleared for psychiatric evaluation no evidence for acute intoxication, nor complicated withdrawal.  The patient is here voluntarily, states that she wants to get help.   Final Clinical Impressions(s) / ED Diagnoses   Final diagnoses:  Suicidal ideation  Anxiety     Carmin Muskrat, MD 05/14/19 2312
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
## 2019-05-14 NOTE — ED Notes (Signed)
Pt dressed out in scrubs: 1 pt bag and 1 book bag locked in locker
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
## 2019-05-15 ENCOUNTER — Other Ambulatory Visit: Payer: Self-pay | Admitting: Family

## 2019-05-15 DIAGNOSIS — F29 Unspecified psychosis not due to a substance or known physiological condition: Secondary | ICD-10-CM | POA: Diagnosis present

## 2019-05-15 LAB — POC URINE PREG, ED: Preg Test, Ur: NEGATIVE

## 2019-05-15 LAB — SARS CORONAVIRUS 2 BY RT PCR (HOSPITAL ORDER, PERFORMED IN ~~LOC~~ HOSPITAL LAB): SARS Coronavirus 2: NEGATIVE

## 2019-05-15 LAB — RAPID URINE DRUG SCREEN, HOSP PERFORMED
Amphetamines: NOT DETECTED
Barbiturates: NOT DETECTED
Benzodiazepines: NOT DETECTED
Cocaine: POSITIVE — AB
Opiates: NOT DETECTED
Tetrahydrocannabinol: POSITIVE — AB

## 2019-05-15 MED ORDER — OLANZAPINE 5 MG PO TBDP
5.0000 mg | ORAL_TABLET | Freq: Two times a day (BID) | ORAL | Status: DC
  Administered 2019-05-15 – 2019-05-16 (×2): 5 mg via ORAL
  Filled 2019-05-15 (×2): qty 1

## 2019-05-15 MED ORDER — OXCARBAZEPINE 150 MG PO TABS
150.0000 mg | ORAL_TABLET | Freq: Two times a day (BID) | ORAL | Status: DC
  Administered 2019-05-15 – 2019-05-16 (×2): 150 mg via ORAL
  Filled 2019-05-15 (×5): qty 1

## 2019-05-15 MED ORDER — DIPHENHYDRAMINE HCL 25 MG PO CAPS: 25.0000 mg | ORAL_CAPSULE | Freq: Four times a day (QID) | ORAL | Status: DC | PRN

## 2019-05-15 NOTE — Progress Notes (Signed)
CSW contacted Aaron Edelman, RN to inform that the patient needs a SARS and Pregnancy test for [facility] admission review.     Ardelle Anton, MSW, LCSW Clinical Social Worker II/Disposition [facility]  Phone: [phone] Fax: [phone]

## 2019-05-15 NOTE — ED Notes (Signed)
Pt to go to [facility] after 8am 05/16/2019

## 2019-05-15 NOTE — ED Notes (Addendum)
Patient tossing and turning in her sleep at this time. Patient had equal rise and fall of chest, no distress noted.

## 2019-05-15 NOTE — ED Notes (Addendum)
Patient awake starring wildly at the ceiling like she does not know where she is. Spoke to patient to make sure she was ok, patient looks as if she is lost and pulls cover up to her face.

## 2019-05-15 NOTE — ED Notes (Signed)
Pt was swabbed at 1am on 10/31 for Coronavirus Send out (6-24 hr). Contacted [facility] and reported specimen already picked up to be taken to [facility]. Called [facility] lab [phone]) and reported received specimen and would run as rapid test. EDP aware.

## 2019-05-15 NOTE — ED Notes (Signed)
Pt attempted to give UA sample- unsuccessful
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
## 2019-05-15 NOTE — ED Notes (Signed)
Patient tearful at this time. Patient speaking in low tone asking" what can make this stop". Vitals obtained. Patient still has not given a urine specimen.

## 2019-05-15 NOTE — ED Notes (Signed)
Pt starting to calm. Verbally talking to sitter at a steady pace. Pt states she just dosent understand why she wants to or fells like shutting down.Pt states she wishes to understand why she wants to shutdown and not feel the way she does.

## 2019-05-15 NOTE — ED Notes (Signed)
Patient awake eating saltine crackers at this time.

## 2019-05-16 ENCOUNTER — Inpatient Hospital Stay (HOSPITAL_COMMUNITY)
Admission: AD | Admit: 2019-05-16 | Discharge: 2019-05-21 | DRG: 897 | Disposition: A | Discharge status: Home / Self Care | Payer: Medicaid Other | Source: Intra-hospital | Attending: Psychiatry | Admitting: Psychiatry

## 2019-05-16 ENCOUNTER — Other Ambulatory Visit: Payer: Self-pay

## 2019-05-16 ENCOUNTER — Encounter (HOSPITAL_COMMUNITY): Payer: Self-pay | Admitting: Emergency Medicine

## 2019-05-16 DIAGNOSIS — Z590 Homelessness: Secondary | ICD-10-CM

## 2019-05-16 DIAGNOSIS — Z20828 Contact with and (suspected) exposure to other viral communicable diseases: Secondary | ICD-10-CM | POA: Diagnosis present

## 2019-05-16 DIAGNOSIS — F39 Unspecified mood [affective] disorder: Secondary | ICD-10-CM | POA: Diagnosis not present

## 2019-05-16 DIAGNOSIS — F17210 Nicotine dependence, cigarettes, uncomplicated: Secondary | ICD-10-CM | POA: Diagnosis present

## 2019-05-16 DIAGNOSIS — F14259 Cocaine dependence with cocaine-induced psychotic disorder, unspecified: Principal | ICD-10-CM | POA: Diagnosis present

## 2019-05-16 DIAGNOSIS — R45851 Suicidal ideations: Secondary | ICD-10-CM | POA: Diagnosis present

## 2019-05-16 DIAGNOSIS — F29 Unspecified psychosis not due to a substance or known physiological condition: Secondary | ICD-10-CM | POA: Diagnosis not present

## 2019-05-16 LAB — URINALYSIS, ROUTINE W REFLEX MICROSCOPIC
Bacteria, UA: NONE SEEN
Bilirubin Urine: NEGATIVE
Glucose, UA: NEGATIVE mg/dL
Hgb urine dipstick: NEGATIVE
Ketones, ur: NEGATIVE mg/dL
Nitrite: NEGATIVE
Protein, ur: NEGATIVE mg/dL
Specific Gravity, Urine: 1.011 (ref 1.005–1.030)
WBC, UA: >50 WBC/hpf — ABNORMAL HIGH (ref 0–5)
pH: 6 (ref 5.0–8.0)

## 2019-05-16 MED ORDER — VITAMIN B-1 100 MG PO TABS
100.0000 mg | ORAL_TABLET | Freq: Every day | ORAL | Status: DC
  Administered 2019-05-17 – 2019-05-21 (×4): 100 mg via ORAL
  Filled 2019-05-16 (×7): qty 1

## 2019-05-16 MED ORDER — ADULT MULTIVITAMIN W/MINERALS CH
1.0000 | ORAL_TABLET | Freq: Every day | ORAL | Status: DC
  Administered 2019-05-16 – 2019-05-21 (×5): 1 via ORAL
  Filled 2019-05-16 (×9): qty 1

## 2019-05-16 MED ORDER — LORAZEPAM 1 MG PO TABS
1.0000 mg | ORAL_TABLET | ORAL | Status: AC | PRN
  Administered 2019-05-17: 10:00:00 1 mg via ORAL
  Filled 2019-05-16: qty 1

## 2019-05-16 MED ORDER — LAMOTRIGINE 25 MG PO TABS
25.0000 mg | ORAL_TABLET | Freq: Every day | ORAL | Status: DC
  Administered 2019-05-16: 16:00:00 25 mg via ORAL
  Filled 2019-05-16 (×4): qty 1

## 2019-05-16 MED ORDER — LORAZEPAM 1 MG PO TABS
1.0000 mg | ORAL_TABLET | Freq: Four times a day (QID) | ORAL | Status: AC | PRN
  Administered 2019-05-16: 16:00:00 1 mg via ORAL
  Filled 2019-05-16 (×2): qty 1

## 2019-05-16 MED ORDER — ONDANSETRON 4 MG PO TBDP: 4.0000 mg | ORAL_TABLET | Freq: Four times a day (QID) | ORAL | Status: DC | PRN

## 2019-05-16 MED ORDER — THIAMINE HCL 100 MG/ML IJ SOLN
100.0000 mg | Freq: Once | INTRAMUSCULAR | Status: AC
  Administered 2019-05-16: 16:00:00 100 mg via INTRAMUSCULAR
  Filled 2019-05-16: qty 2

## 2019-05-16 MED ORDER — LOPERAMIDE HCL 2 MG PO CAPS: 2.0000 mg | ORAL_CAPSULE | ORAL | Status: DC | PRN

## 2019-05-16 MED ORDER — ALUM & MAG HYDROXIDE-SIMETH 200-200-20 MG/5ML PO SUSP: 30.0000 mL | ORAL | Status: DC | PRN

## 2019-05-16 MED ORDER — OLANZAPINE 5 MG PO TBDP
5.0000 mg | ORAL_TABLET | Freq: Three times a day (TID) | ORAL | Status: DC | PRN
  Administered 2019-05-16 – 2019-05-20 (×4): 5 mg via ORAL
  Filled 2019-05-16 (×4): qty 1

## 2019-05-16 MED ORDER — PNEUMOCOCCAL VAC POLYVALENT 25 MCG/0.5ML IJ INJ: 0.5000 mL | INJECTION | INTRAMUSCULAR | Status: DC

## 2019-05-16 MED ORDER — HYDROXYZINE HCL 25 MG PO TABS
25.0000 mg | ORAL_TABLET | Freq: Four times a day (QID) | ORAL | Status: AC | PRN
  Administered 2019-05-16 – 2019-05-18 (×5): 25 mg via ORAL
  Filled 2019-05-16 (×5): qty 1

## 2019-05-16 MED ORDER — INFLUENZA VAC SPLIT QUAD 0.5 ML IM SUSY
0.5000 mL | PREFILLED_SYRINGE | INTRAMUSCULAR | Status: DC
  Filled 2019-05-16: qty 0.5

## 2019-05-16 MED ORDER — NICOTINE 21 MG/24HR TD PT24
21.0000 mg | MEDICATED_PATCH | Freq: Every day | TRANSDERMAL | Status: DC
  Administered 2019-05-16 – 2019-05-20 (×4): 21 mg via TRANSDERMAL
  Filled 2019-05-16 (×9): qty 1

## 2019-05-16 MED ORDER — ZIPRASIDONE MESYLATE 20 MG IM SOLR
20.0000 mg | INTRAMUSCULAR | Status: AC | PRN
  Administered 2019-05-18: 15:00:00 20 mg via INTRAMUSCULAR
  Filled 2019-05-16: qty 20

## 2019-05-16 MED ORDER — RISPERIDONE 1 MG PO TABS
1.0000 mg | ORAL_TABLET | Freq: Two times a day (BID) | ORAL | Status: DC
  Administered 2019-05-16: 1 mg via ORAL
  Filled 2019-05-16 (×5): qty 1

## 2019-05-16 MED ORDER — ACETAMINOPHEN 325 MG PO TABS
650.0000 mg | ORAL_TABLET | Freq: Four times a day (QID) | ORAL | Status: DC | PRN
  Administered 2019-05-18 – 2019-05-19 (×2): 650 mg via ORAL
  Filled 2019-05-16 (×2): qty 2

## 2019-05-16 MED ORDER — MAGNESIUM HYDROXIDE 400 MG/5ML PO SUSP: 30.0000 mL | Freq: Every day | ORAL | Status: DC | PRN
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
## 2019-05-16 NOTE — ED Notes (Signed)
Patient left unit with transportation service.  All belongings with patient.

## 2019-05-16 NOTE — ED Notes (Signed)
Spoke with Tommi Rumps with [facility] ETA to pick pt up and transport to [facility] 30 minutes.

## 2019-05-16 NOTE — BHH Suicide Risk Assessment (Signed)
[facility] Admission Suicide Risk Assessment   Nursing information obtained from:  Patient Demographic factors:  Caucasian Current Mental Status:  NA Loss Factors:  NA Historical Factors:  NA Risk Reduction Factors:  Positive therapeutic relationship  Total Time spent with patient: 45 minutes Principal Problem:  Substance Induced Mood Disorder/ Substance Induced Psychosis Diagnosis:  Active Problems:   Mood disorder with psychosis ([place])  Subjective Data:  Continued Clinical Symptoms:  Alcohol Use Disorder Identification Test Final Score (AUDIT): 3 The "Alcohol Use Disorders Identification Test", Guidelines for Use in Primary Care, Second Edition.  World Pharmacologist [facility]). Score between 0-7:  no or low risk or alcohol related problems. Score between 8-15:  moderate risk of alcohol related problems. Score between 16-19:  high risk of alcohol related problems. Score 20 or above:  warrants further diagnostic evaluation for alcohol dependence and treatment.   CLINICAL FACTORS:  18, single , no children, currently homeless,after her father kicked her out of the house earlier this week. Presented to ED on 10/30 reporting worsening anxiety, suicidal ideations, with thoughts of wanting to go to sleep and not wake up.. Currently presents with disorganized thought process, appears to be thought blocking at times, and has  difficulty describing her symptoms. Reports intrusive, repetitive thoughts but has difficulty elaborating . At times does not answer questions or does so with gestures Makes  statements such as " I lie all the time, so then I don't know if I am still lying? ".Endorses some neuro-vegetative symptoms, including poor sleep, poor energy, some anhedonia. As noted, endorses some passive SI preceding admission but denies suicidal plan or intention and contracts for safety on unit at this time. Contributing factors include mother's death, who passed away last year, strained relationship  with father who recently kicked her out of the house, drug abuse. History of substance abuse, and states has been using cocaine regularly.She also describes episodic drinking. Admission UDS positive for cocaine and cannabis, BAL negative.  Denies medical illnesses, reports allergy to Zoloft ( " made me feel weird"), and Abilify ( describes possible dystonic reaction). Home medications: Seroquel 50 mgrs BID and 300 mgrs QHS. Lamictal 50 mgrs BID. Was not taking regularly recently . History of prior psychiatric admissions. She had been admitted to [facility] in August of 2020 for depression, anxiety, substance abuse . At the time was discharged on Prozac . History of suicidal attempt at age 23 by cutting self .History of substance abuse, including methamphetamine, BZDs, Opiates, Cannabis. Endorses recent cocaine use, denies other drug or alcohol use . Family History- Mother died last year. Reports strained relationship with father. Has one brother who lives out of state. Maternal grandmother committed suicide. Mother had history of depression. * vitals are currently stable ( 113/80, pulse 81, no fever). No distal tremors or diaphoresis noted .  Dx- Substance Induced Depression, Substance Induced Psychosis versus MDD with psychotic features. Cocaine, Cannabis Use Disorder.  Plan- inpatient admission. We discussed options. At this time will start Risperidone 1 mgr BID, and titrate as tolerated . Reports has been on Lamotrigine but unclear if has been compliant with medication recently so will resume at 25 mgrs QDAY . No current symptoms of alcohol WDL and admission BAL negative but will start Ativan PRN for possible alcohol WDL symptoms if needed . Agitation protocol for acute agitation if needed .( 10/31 EKG QTc 411)  UA positive for leukocytes, no nitrites- patient not currently endorsing symptoms of UTI. Will order U culture.   Musculoskeletal: Strength &  Muscle Tone: within normal limits no current tremors or  diaphoresis Gait & Station: normal Patient leans: N/A  Psychiatric Specialty Exam: Physical Exam  ROS denies chest pain or shortness of breath, no vomiting, no fever, no chills, no rash  Blood pressure 113/80, pulse 81, temperature 98.2 F (36.8 C), temperature source Oral, resp. rate 16, SpO2 98 %.There is no height or weight on file to calculate BMI.  General Appearance: Fairly Groomed  Eye Contact:  Fair  Speech:  Normal Rate  Volume:  Normal  Mood:  depressed and anxious   Affect:  congruent, anxious  Thought Process:  Disorganized and Descriptions of Associations: Tangential appears to be thought blocking at times.  Orientation:  Other:  alert, attentive, oriented x 3.   Thought Content:  Denies hallucinations, but appears distractible and possibly internally preoccupied at times.  Suicidal Thoughts:  No denies suicidal ideations and contracts for safety on unit  Homicidal Thoughts:  No denies homicidal or violent ideations  Memory:  Of note 3/3 immediate and 3/3 at 5 minutes.  Able to name common objects without difficulty, able to spell 5 letter word forward and backward without difficulty.  Currently does not appear delirious  Judgement:  Impaired  Insight:  Lacking  Psychomotor Activity:  Normal no significant psychomotor agitation or restlessness.  No tremors noted at this time  Concentration:  Concentration: Poor and Attention Span: Poor  Recall:  Good  Fund of Knowledge:  Fair  Language:  Fair  Akathisia:  Negative  Handed:  Right  AIMS (if indicated):     Assets:  Desire for Improvement Resilience  ADL's:  Intact  Cognition:  WNL  Sleep:         COGNITIVE FEATURES THAT CONTRIBUTE TO RISK:  Closed-mindedness, Loss of executive function and Polarized thinking    SUICIDE RISK:   Moderate:  Frequent suicidal ideation with limited intensity, and duration, some specificity in terms of plans, no associated intent, good self-control, limited dysphoria/symptomatology,  some risk factors present, and identifiable protective factors, including available and accessible social support.  PLAN OF CARE: Patient will be admitted to inpatient psychiatric unit for stabilization and safety. Will provide and encourage milieu participation. Provide medication management and maked adjustments as needed.  Will follow daily.    I certify that inpatient services furnished can reasonably be expected to improve the patient's condition.   Jenne Campus, MD 05/16/2019, 11:25 AM

## 2019-05-16 NOTE — Tx Team (Addendum)
Initial Treatment Plan 05/16/2019 11:45 AM [name]    PATIENT STRESSORS: Medication change or noncompliance Substance abuse   PATIENT STRENGTHS: Average or above average intelligence Motivation for treatment/growth   PATIENT IDENTIFIED PROBLEMS: "Anxiety"  Psychosis "I hear voices all the time"    Crying spells  "Substance Abuse"               DISCHARGE CRITERIA:  Improved stabilization in mood, thinking, and/or behavior Verbal commitment to aftercare and medication compliance  PRELIMINARY DISCHARGE PLAN: Outpatient therapy Return to previous living arrangement  PATIENT/FAMILY INVOLVEMENT: This treatment plan has been presented to and reviewed with the patient, Julia [name], and/or family member.  The patient and family have been given the opportunity to ask questions and make suggestions.  Harriet Masson, RN 05/16/2019, 11:45 AM

## 2019-05-16 NOTE — Progress Notes (Signed)
Patient ID: Julia [name], female   DOB: 01/29/2001, 18 y.o.   MRN: [id number] [place] NOVEL CORONAVIRUS (COVID-19) DAILY CHECK-OFF SYMPTOMS - answer yes or no to each - every day NO YES  Have you had a fever in the past 24 hours?  . Fever (Temp > 37.80C / 100F) X   Have you had any of these symptoms in the past 24 hours? . New Cough .  Sore Throat  .  Shortness of Breath .  Difficulty Breathing .  Unexplained Body Aches   X   Have you had any one of these symptoms in the past 24 hours not related to allergies?   . Runny Nose .  Nasal Congestion .  Sneezing   X   If you have had runny nose, nasal congestion, sneezing in the past 24 hours, has it worsened?  X   EXPOSURES - check yes or no X   Have you traveled outside the state in the past 14 days?  X   Have you been in contact with someone with a confirmed diagnosis of COVID-19 or PUI in the past 14 days without wearing appropriate PPE?  X   Have you been living in the same home as a person with confirmed diagnosis of COVID-19 or a PUI (household contact)?    X   Have you been diagnosed with COVID-19?    X              What to do next: Answered NO to all: Answered YES to anything:   Proceed with unit schedule Follow the [facility] Inpatient Flowsheet.   

## 2019-05-16 NOTE — BHH Counselor (Signed)
Adult Comprehensive Assessment  Patient ID: Julia [name], female   DOB: [birth year], 18 y.o.   MRN: [id number]  Information Source: Information source: Patient  Current Stressors:  Patient states their primary concerns and needs for treatment are:: "Difficulty concentrating; life has caused me a bunch of problems." Patient states their goals for this hospitilization and ongoing recovery are:: Get on meds and stay on them Educational / Learning stressors: Denies stressors Employment / Job issues: Not able to concentrate to get a job Family Relationships: "My dad doesn't care."  States that her father sold his house because he was using drugs, but he has told people that it was to help save her.  Is very angry about this. Financial / Lack of resources (include bankruptcy): No income, no insurance Housing / Lack of housing: Homeless for [employer], with father one night, then in a hotel room or on [employer] Physical health (include injuries & life threatening diseases): Denies stressors Social relationships: Denies stressors Substance abuse: Stressful because does not want to do "it."  States she uses "a little bit of everything." Bereavement / Loss: Mother died last year.  Living/Environment/Situation:  Living Arrangements: Other (Comment) Living conditions (as described by patient or guardian): Homeless, will stay at various people's homes short-term or get a hotel room Who else lives in the home?: Nobody - homeless How long has patient lived in current situation?: "awhile" What is atmosphere in current home: Chaotic, Temporary  Family History:  Marital status: Single(States she has a "friend" but he is married) Are you sexually active?: Yes What is your sexual orientation?: heterosexual Does patient have children?: No  Childhood History:  By whom was/is the patient raised?: Both parents Additional childhood history information: both parents raised her; divorced when she was  18yo Description of patient's relationship with caregiver when they were a child: Close to both growing up.  However, mother left when patient was 14yo to live in [place]. Patient's description of current relationship with people who raised him/her: Father-mentally abusive, very poor relationship; Mother - deceased How were you disciplined when you got in trouble as a child/adolescent?: N/A Does patient have siblings?: Yes Number of Siblings: 1 Description of patient's current relationship with siblings: Brother - "he doesn't care" Did patient suffer any verbal/emotional/physical/sexual abuse as a child?: No Did patient suffer from severe childhood neglect?: No Has patient ever been sexually abused/assaulted/raped as an adolescent or adult?: No Was the patient ever a victim of a crime or a disaster?: Yes(Cannot concentrate long enough to respond.) Patient description of being a victim of a crime or disaster: Cannot concentrate to respond Witnessed domestic violence?: No Has patient been effected by domestic violence as an adult?: No  Education:  Highest grade of school patient has completed: High school Currently a student?: No Learning disability?: No  Employment/Work Situation:   Employment situation: Unemployed What is the longest time patient has a held a job?: Few months but was fired due to not showing up to work. Father reports she would spend all her money on drugs and alcohol and would not show up to work.  Where was the patient employed at that time?: [employer]  Did You Receive Any Psychiatric Treatment/Services While in the [employer]?: (No [employer]) Are There Guns or Other Weapons in [place]?: No  Financial Resources:   Financial resources: No income Does patient have a representative payee or guardian?: No  Alcohol/Substance Abuse:   What has been your use of drugs/alcohol within the  last 12 months?: States she uses "a little bit of everything" Alcohol/Substance  Abuse Treatment Hx: Past Tx, Inpatient If yes, describe treatment: [facility] in 2018, 2019, and 02/2019.  [facility] Has alcohol/substance abuse ever caused legal problems?: Yes  Social Support System:   Patient's Community Support System: Fair Describe Community Support System: Some friends in the community will help her with food, etc. sometimes Type of faith/religion: Darrick Meigs How does patient's faith help to cope with current illness?: Does not help,  Leisure/Recreation:   Leisure and Hobbies: "hanging out with friends."  Strengths/Needs:   What is the patient's perception of their strengths?: Can see that she has problems Patient states they can use these personal strengths during their treatment to contribute to their recovery: Can address her problems since she has been able to identify them. Patient states these barriers may affect/interfere with their treatment: None Patient states these barriers may affect their return to the community: None Other important information patient would like considered in planning for their treatment: None  Discharge Plan:   Currently receiving community mental health services: No Patient states concerns and preferences for aftercare planning are: Has been to [facility] in the past.  Is asking for referral to rehab if possible, maybe [facility].  Did not follow up last time.  Is asking for referral to [facility] in [place], a halfway house. Patient states they will know when they are safe and ready for discharge when: I don't know. Does patient have access to transportation?: No Does patient have financial barriers related to discharge medications?: Yes Patient description of barriers related to discharge medications: No income, no insurance Plan for no access to transportation at discharge: Will need assistance Will patient be returning to same living situation after discharge?: No  Summary/Recommendations:   Summary  and Recommendations (to be completed by the evaluator): Patient is a 18yo female last hospitalized at [facility] in August 2020 who is admitted with anxiety, substance abuse and suicidal ideation.  Primary stressors include homelessness, not being able to concentrate and therefore being unable to be employed, poor family relationships, and death of her mother last year.  When asked what substances she uses, she states "A little bit of everything."  She states she did not follow up at [facility] or [facility] after her last admission.  She is asking to go to rehab and/or a halfway house at discharge, specifically [facility] in [place].  Patient will benefit from crisis stabilization, medication evaluation, group therapy and psychoeducation, in addition to case management for discharge planning. At discharge it is recommended that Patient adhere to the established discharge plan and continue in treatment.  Maretta Los. 05/16/2019

## 2019-05-16 NOTE — Progress Notes (Signed)
Call report to Adult Unit after 0800 am.  Call report to [phone].

## 2019-05-16 NOTE — ED Notes (Signed)
Attempt report to [facility] [phone]

## 2019-05-16 NOTE — BHH Suicide Risk Assessment (Signed)
[facility] INPATIENT:  Family/Significant Other Suicide Prevention Education  Suicide Prevention Education:  Patient Refusal for Family/Significant Other Suicide Prevention Education: The patient Julia [name] has refused to provide written consent for family/significant other to be provided Family/Significant Other Suicide Prevention Education during admission and/or prior to discharge.  Physician notified.  [name] 05/16/2019, 4:09 PM

## 2019-05-17 DIAGNOSIS — F39 Unspecified mood [affective] disorder: Secondary | ICD-10-CM

## 2019-05-17 LAB — LIPID PANEL
Cholesterol: 144 mg/dL (ref 0–200)
HDL: 35 mg/dL — ABNORMAL LOW (ref >40)
LDL Cholesterol: 96 mg/dL (ref 0–99)
Total CHOL/HDL Ratio: 4.1 RATIO
Triglycerides: 65 mg/dL (ref <150)
VLDL: 13 mg/dL (ref 0–40)

## 2019-05-17 LAB — TSH: TSH: 1.981 u[IU]/mL (ref 0.350–4.500)

## 2019-05-17 LAB — HEMOGLOBIN A1C
Hgb A1c MFr Bld: 5.2 % (ref 4.8–5.6)
Mean Plasma Glucose: 102.54 mg/dL

## 2019-05-17 MED ORDER — LAMOTRIGINE 25 MG PO TABS
25.0000 mg | ORAL_TABLET | Freq: Two times a day (BID) | ORAL | Status: DC
  Administered 2019-05-17 – 2019-05-21 (×7): 25 mg via ORAL
  Filled 2019-05-17 (×12): qty 1

## 2019-05-17 MED ORDER — RISPERIDONE 2 MG PO TABS
2.0000 mg | ORAL_TABLET | Freq: Every day | ORAL | Status: DC
  Administered 2019-05-17 – 2019-05-20 (×4): 2 mg via ORAL
  Filled 2019-05-17 (×6): qty 1

## 2019-05-17 NOTE — BHH Group Notes (Signed)
[facility] LCSW Group Therapy Note  Date/Time: 05/17/2019 @ 1:30pm  Type of Therapy and Topic:  Group Therapy:  Overcoming Obstacles  Participation Level:  [facility] PARTICIPATION LEVEL: Active  Description of Group:    In this group patients will be encouraged to explore what they see as obstacles to their own wellness and recovery. They will be guided to discuss their thoughts, feelings, and behaviors related to these obstacles. The group will process together ways to cope with barriers, with attention given to specific choices patients can make. Each patient will be challenged to identify changes they are motivated to make in order to overcome their obstacles. This group will be process-oriented, with patients participating in exploration of their own experiences as well as giving and receiving support and challenge from other group members.  Therapeutic Goals: 1. Patient will identify personal and current obstacles as they relate to admission. 2. Patient will identify barriers that currently interfere with their wellness or overcoming obstacles.  3. Patient will identify feelings, thought process and behaviors related to these barriers. 4. Patient will identify two changes they are willing to make to overcome these obstacles:    Summary of Patient Progress   Patient was active and engaged throughout group therapy today. Patient was able to identify current obstacles that she has which is her being homeless and her mental health. Patient was able to identify barriers that currently interfere with her obstacles which is her not knowing about her mental illness diagnoses, drugs, and money. Patient was able to identify two changes that she is willing to make to overcome these obstacles which is getting into a rehab or halfway house.    Therapeutic Modalities:   Cognitive Behavioral Therapy Solution Focused Therapy Motivational Interviewing Relapse Prevention Therapy   Ardelle Anton, LCSW

## 2019-05-17 NOTE — Progress Notes (Signed)
Adult Psychoeducational Group Note  Date:  05/17/2019 Time:  9:13 PM  Group Topic/Focus:  Wrap-Up Group:   The focus of this group is to help patients review their daily goal of treatment and discuss progress on daily workbooks.  Participation Level:  Active  Participation Quality:  Appropriate  Affect:  Appropriate  Cognitive:  Appropriate  Insight: Appropriate  Engagement in Group:  Engaged  Modes of Intervention:  Discussion  Additional Comments:  Pt attend wrap up group. Her day was a 7. The one positive thing that happen to her today she was able to interact other pt.  Lenice Llamas Long 05/17/2019, 9:13 PM

## 2019-05-17 NOTE — Progress Notes (Signed)
DAR NOTE: Patient presents with anxious affect and mood.  Denies suicidal thoughts, pain, auditory and visual hallucinations.  Rates depression at 5, hopelessness at 0, and anxiety at 8.  Maintained on routine safety checks.  Medications given as prescribed.  Support and encouragement offered as needed.  Attended group and participated.  Patient is visible in milieu with minimal interaction. observed socializing with peers in the dayroom.  Requested and received Vistaril 25 mg for complain of anxiety with good effect.

## 2019-05-17 NOTE — Tx Team (Signed)
Interdisciplinary Treatment and Diagnostic Plan Update  05/17/2019 Time of Session: 10:35am Julia [name] MRN: [id number]  Principal Diagnosis: <principal problem not specified>  Secondary Diagnoses: Active Problems:   Mood disorder with psychosis ([place])   Cocaine dependence with cocaine-induced psychotic disorder with complication ([place])   Current Medications:  Current Facility-Administered Medications  Medication Dose Route Frequency Provider Last Rate Last Dose  . acetaminophen (TYLENOL) tablet 650 mg  650 mg Oral Q6H PRN Suella Broad, FNP      . alum & mag hydroxide-simeth (MAALOX/MYLANTA) 200-200-20 MG/5ML suspension 30 mL  30 mL Oral Q4H PRN Burt Ek, Gayland Curry, FNP      . hydrOXYzine (ATARAX/VISTARIL) tablet 25 mg  25 mg Oral Q6H PRN Cobos, Myer Peer, MD   25 mg at 05/17/19 1434  . influenza vac split quadrivalent PF (FLUARIX) injection 0.5 mL  0.5 mL Intramuscular Tomorrow-1000 [name], MD      . lamoTRIgine (LAMICTAL) tablet 25 mg  25 mg Oral BID [name], MD   25 mg at 05/17/19 0954  . LORazepam (ATIVAN) tablet 1 mg  1 mg Oral Q6H PRN Cobos, Myer Peer, MD   1 mg at 05/16/19 1620  . multivitamin with minerals tablet 1 tablet  1 tablet Oral Daily Cobos, Myer Peer, MD   1 tablet at 05/17/19 0846  . nicotine (NICODERM CQ - dosed in mg/24 hours) patch 21 mg  21 mg Transdermal Daily Cobos, Myer Peer, MD   21 mg at 05/17/19 0846  . OLANZapine zydis (ZYPREXA) disintegrating tablet 5 mg  5 mg Oral Q8H PRN Cobos, Myer Peer, MD   5 mg at 05/17/19 0847   And  . ziprasidone (GEODON) injection 20 mg  20 mg Intramuscular PRN Cobos, Myer Peer, MD      . pneumococcal 23 valent vaccine (PNU-IMMUNE) injection 0.5 mL  0.5 mL Intramuscular Tomorrow-1000 [name], MD      . risperiDONE (RISPERDAL) tablet 2 mg  2 mg Oral QHS [name], MD      . thiamine (VITAMIN B-1) tablet 100 mg  100 mg Oral Daily Cobos, Myer Peer, MD   100 mg at 05/17/19 0846   PTA  Medications: Medications Prior to Admission  Medication Sig Dispense Refill Last Dose  . etonogestrel (NEXPLANON) 68 MG IMPL implant 1 each (68 mg total) by Subdermal route once for 1 dose. (Birth control method) 1 each 0   . FLUoxetine (PROZAC) 20 MG capsule Take 1 capsule (20 mg total) by mouth daily. (Patient not taking: Reported on 05/15/2019) 30 capsule 0   . hydrOXYzine (ATARAX/VISTARIL) 25 MG tablet Take 1 tablet (25 mg total) by mouth 3 (three) times daily as needed for anxiety. (Patient not taking: Reported on 05/15/2019) 30 tablet 0   . ibuprofen (ADVIL) 600 MG tablet Take 1 tablet (600 mg total) by mouth every 6 (six) hours as needed. (Patient not taking: Reported on 05/15/2019) 30 tablet 0   . lamoTRIgine (LAMICTAL) 25 MG tablet Take 50 mg by mouth 2 (two) times daily. Take 2 tabs(50mg ) po qam, 2 tabs(50mg ) po qpm.     . nicotine polacrilex (NICORETTE) 2 MG gum Take 1 each (2 mg total) by mouth as needed for smoking cessation. (Patient not taking: Reported on 05/15/2019) 100 tablet 0   . QUEtiapine (SEROQUEL) 300 MG tablet Take 300 mg by mouth at bedtime.     [name] QUEtiapine (SEROQUEL) 50 MG tablet Take 50 mg by mouth 2 (two) times daily. Take 1 tab  po 7am, 1 tab po 3pm.       Patient Stressors: Medication change or noncompliance Substance abuse  Patient Strengths: Average or above average intelligence Motivation for treatment/growth  Treatment Modalities: Medication Management, Group therapy, Case management,  1 to 1 session with clinician, Psychoeducation, Recreational therapy.   Physician Treatment Plan for Primary Diagnosis: <principal problem not specified> Long Term Goal(s): Improvement in symptoms so as ready for discharge Improvement in symptoms so as ready for discharge   Short Term Goals: Ability to identify changes in lifestyle to reduce recurrence of condition will improve Ability to disclose and discuss suicidal ideas Ability to identify and develop effective coping  behaviors will improve Ability to identify triggers associated with substance abuse/mental health issues will improve Ability to verbalize feelings will improve Ability to disclose and discuss suicidal ideas Ability to identify and develop effective coping behaviors will improve Compliance with prescribed medications will improve Ability to identify triggers associated with substance abuse/mental health issues will improve  Medication Management: Evaluate patient's response, side effects, and tolerance of medication regimen.  Therapeutic Interventions: 1 to 1 sessions, Unit Group sessions and Medication administration.  Evaluation of Outcomes: Progressing  Physician Treatment Plan for Secondary Diagnosis: Active Problems:   Mood disorder with psychosis ([place])   Cocaine dependence with cocaine-induced psychotic disorder with complication ([place])  Long Term Goal(s): Improvement in symptoms so as ready for discharge Improvement in symptoms so as ready for discharge   Short Term Goals: Ability to identify changes in lifestyle to reduce recurrence of condition will improve Ability to disclose and discuss suicidal ideas Ability to identify and develop effective coping behaviors will improve Ability to identify triggers associated with substance abuse/mental health issues will improve Ability to verbalize feelings will improve Ability to disclose and discuss suicidal ideas Ability to identify and develop effective coping behaviors will improve Compliance with prescribed medications will improve Ability to identify triggers associated with substance abuse/mental health issues will improve     Medication Management: Evaluate patient's response, side effects, and tolerance of medication regimen.  Therapeutic Interventions: 1 to 1 sessions, Unit Group sessions and Medication administration.  Evaluation of Outcomes: Progressing   RN Treatment Plan for Primary Diagnosis: <principal problem not  specified> Long Term Goal(s): Knowledge of disease and therapeutic regimen to maintain health will improve  Short Term Goals: Ability to participate in decision making will improve, Ability to verbalize feelings will improve, Ability to disclose and discuss suicidal ideas, Ability to identify and develop effective coping behaviors will improve and Compliance with prescribed medications will improve  Medication Management: RN will administer medications as ordered by provider, will assess and evaluate patient's response and provide education to patient for prescribed medication. RN will report any adverse and/or side effects to prescribing provider.  Therapeutic Interventions: 1 on 1 counseling sessions, Psychoeducation, Medication administration, Evaluate responses to treatment, Monitor vital signs and CBGs as ordered, Perform/monitor CIWA, COWS, AIMS and Fall Risk screenings as ordered, Perform wound care treatments as ordered.  Evaluation of Outcomes: Progressing   LCSW Treatment Plan for Primary Diagnosis: <principal problem not specified> Long Term Goal(s): Safe transition to appropriate next level of care at discharge, Engage patient in therapeutic group addressing interpersonal concerns.  Short Term Goals: Engage patient in aftercare planning with referrals and resources and Increase skills for wellness and recovery  Therapeutic Interventions: Assess for all discharge needs, 1 to 1 time with Social worker, Explore available resources and support systems, Assess for adequacy in community support network, Educate  family and significant other(s) on suicide prevention, Complete Psychosocial Assessment, Interpersonal group therapy.  Evaluation of Outcomes: Progressing   Progress in Treatment: Attending groups: Yes. Participating in groups: Yes. Taking medication as prescribed: Yes. Toleration medication: Yes. Family/Significant other contact made: No, will contact:  pt declined; with  pt Patient understands diagnosis: No. Discussing patient identified problems/goals with staff: Yes. Medical problems stabilized or resolved: Yes. Denies suicidal/homicidal ideation: Yes. Issues/concerns per patient self-inventory: No. Other:   New problem(s) identified: No, Describe:  None  New Short Term/Long Term Goal(s): Medication stabilization, elimination of SI thoughts, and development of a comprehensive mental wellness plan.   Patient Goals:  Patient did not attend treatment team. Patient was asleep.   Discharge Plan or Barriers: CSW will continue to follow up for appropriate referrals and possible discharge planning  Reason for Continuation of Hospitalization: Depression Medication stabilization Withdrawal symptoms  Estimated Length of Stay: 3-5 days   Attendees: Patient:  05/17/2019  Physician: Dr. [name], MD 05/17/2019   Nursing: Benjamine Mola, RN 05/17/2019   RN Care Manager: 05/17/2019  Social Worker: Ardelle Anton, LCSW  05/17/2019   Recreational Therapist:  05/17/2019   Other:  05/17/2019   Other:  05/17/2019   Other: 05/17/2019     Scribe for Treatment Team: Trecia Rogers, LCSW 05/17/2019 2:46 PM

## 2019-05-17 NOTE — Progress Notes (Signed)
[facility] MD Progress Note  05/17/2019 7:58 AM [name]  MRN:  [id number] Subjective:    Patient in bed somewhat irritable and does not want to participate meaningfully in the interview process as she is "tired" and does not want to answer many questions however she denies current thoughts of harming herself, she does not have thought blocking or previously noted psychotic symptoms.  She does not have cravings or withdrawal symptoms.  Denies again any suicidal or homicidal thoughts.  No involuntary movements  Principal Problem: Polysubstance abuse/dependency is with related substance-induced mood disorder depressed type with psychosis Diagnosis: Active Problems:   Mood disorder with psychosis ([place])   Cocaine dependence with cocaine-induced psychotic disorder with complication ([place])  Total Time spent with patient: 20 minutes  Past Psychiatric History: Last here in August in fact discharged 02/19/2019 only to represent to the emergency department on 8/18  Past Medical History:  Past Medical History:  Diagnosis Date  . Burning with urination 05/03/2015  . Contraceptive management 05/03/2015  . Depression   . Dysmenorrhea 12/30/2013  . Heroin addiction ([place])   . Menorrhagia 12/30/2013  . Menstrual extraction 12/30/2013  . Migraines   . Social anxiety disorder 09/25/2016  . Vaginal odor 05/03/2015    Past Surgical History:  Procedure Laterality Date  . NO PAST SURGERIES     Family History:  Family History  Problem Relation Age of Onset  . Depression Mother   . Hypertension Father   . Hyperlipidemia Father   . Cancer Paternal Grandmother        breast, uterine  . Cirrhosis Paternal Grandfather        due to alcohol   Family Psychiatric  History: no new Social History:  Social History   Substance and Sexual Activity  Alcohol Use Yes     Social History   Substance and Sexual Activity  Drug Use Not Currently  . Types: Marijuana, Cocaine, Oxycodone   Comment: opitates,  xanax, canned air    Social History   Socioeconomic History  . Marital status: Single    Spouse name: Not on file  . Number of children: Not on file  . Years of education: Not on file  . Highest education level: Not on file  Occupational History  . Not on file  Social Needs  . Financial resource strain: Not on file  . Food insecurity    Worry: Not on file    Inability: Not on file  . Transportation needs    Medical: Not on file    Non-medical: Not on file  Tobacco Use  . Smoking status: Current Every Day Smoker    Packs/day: 0.25    Types: Cigarettes  . Smokeless tobacco: Never Used  . Tobacco comment: parents smoke   Substance and Sexual Activity  . Alcohol use: Yes  . Drug use: Not Currently    Types: Marijuana, Cocaine, Oxycodone    Comment: opitates, xanax, canned air  . Sexual activity: Yes    Birth control/protection: Implant  Lifestyle  . Physical activity    Days per week: Not on file    Minutes per session: Not on file  . Stress: Not on file  Relationships  . Social Herbalist on phone: Not on file    Gets together: Not on file    Attends religious service: Not on file    Active member of club or organization: Not on file    Attends meetings of clubs or organizations:  Not on file    Relationship status: Not on file  Other Topics Concern  . Not on file  Social History Narrative   Lives with Dad. Mom has LGD, lives in [place]. 11th grader. Dog.   Additional Social History:                         Sleep: Good  Appetite:  Fair  Current Medications: Current Facility-Administered Medications  Medication Dose Route Frequency Provider Last Rate Last Dose  . acetaminophen (TYLENOL) tablet 650 mg  650 mg Oral Q6H PRN Suella Broad, FNP      . alum & mag hydroxide-simeth (MAALOX/MYLANTA) 200-200-20 MG/5ML suspension 30 mL  30 mL Oral Q4H PRN Burt Ek, Gayland Curry, FNP      . hydrOXYzine (ATARAX/VISTARIL) tablet 25 mg  25 mg Oral  Q6H PRN Cobos, Myer Peer, MD   25 mg at 05/16/19 1924  . influenza vac split quadrivalent PF (FLUARIX) injection 0.5 mL  0.5 mL Intramuscular Tomorrow-1000 [name], MD      . lamoTRIgine (LAMICTAL) tablet 25 mg  25 mg Oral BID [name], MD      . LORazepam (ATIVAN) tablet 1 mg  1 mg Oral Q6H PRN Cobos, Myer Peer, MD   1 mg at 05/16/19 1620  . OLANZapine zydis (ZYPREXA) disintegrating tablet 5 mg  5 mg Oral Q8H PRN Cobos, Myer Peer, MD   5 mg at 05/16/19 1849   And  . LORazepam (ATIVAN) tablet 1 mg  1 mg Oral PRN Cobos, Myer Peer, MD       And  . ziprasidone (GEODON) injection 20 mg  20 mg Intramuscular PRN Cobos, Myer Peer, MD      . multivitamin with minerals tablet 1 tablet  1 tablet Oral Daily Cobos, Myer Peer, MD   1 tablet at 05/16/19 1621  . nicotine (NICODERM CQ - dosed in mg/24 hours) patch 21 mg  21 mg Transdermal Daily Cobos, Myer Peer, MD   21 mg at 05/16/19 1620  . pneumococcal 23 valent vaccine (PNU-IMMUNE) injection 0.5 mL  0.5 mL Intramuscular Tomorrow-1000 [name], MD      . risperiDONE (RISPERDAL) tablet 2 mg  2 mg Oral QHS [name], MD      . thiamine (VITAMIN B-1) tablet 100 mg  100 mg Oral Daily Cobos, Myer Peer, MD        Lab Results:  Results for orders placed or performed during the hospital encounter of 05/16/19 (from the past 48 hour(s))  Lipid panel     Status: Abnormal   Collection Time: 05/17/19  6:22 AM  Result Value Ref Range   Cholesterol 144 0 - 200 mg/dL   Triglycerides 65 <150 mg/dL   HDL 35 (L) >40 mg/dL   Total CHOL/HDL Ratio 4.1 RATIO   VLDL 13 0 - 40 mg/dL   LDL Cholesterol 96 0 - 99 mg/dL    Comment:        Total Cholesterol/HDL:CHD Risk Coronary Heart Disease Risk Table                     Men   Women  1/2 Average Risk   3.4   3.3  Average Risk       5.0   4.4  2 X Average Risk   9.6   7.1  3 X Average Risk  23.4   11.0        Use the  calculated Patient Ratio above and the CHD Risk Table to determine the patient's  CHD Risk.        ATP III CLASSIFICATION (LDL):  <100     mg/dL   Optimal  100-129  mg/dL   Near or Above                    Optimal  130-159  mg/dL   Borderline  160-189  mg/dL   High  >190     mg/dL   Very High Performed at [facility] [address], [place], [place] [address]     Blood Alcohol level:  Lab Results  Component Value Date   [facility] <10 05/14/2019   ETH <10 123XX123    Metabolic Disorder Labs: Lab Results  Component Value Date   HGBA1C 5.4 02/17/2019   MPG 108.28 02/17/2019   MPG 94 09/25/2016   Lab Results  Component Value Date   PROLACTIN 31.9 (H) 02/17/2019   PROLACTIN 29.6 (H) 09/25/2016   Lab Results  Component Value Date   CHOL 144 05/17/2019   TRIG 65 05/17/2019   HDL 35 (L) 05/17/2019   CHOLHDL 4.1 05/17/2019   VLDL 13 05/17/2019   LDLCALC 96 05/17/2019   LDLCALC 76 02/17/2019    Physical Findings: AIMS:  , ,  ,  ,    CIWA:  CIWA-Ar Total: 1 COWS:     Musculoskeletal: Strength & Muscle Tone: within normal limits Gait & Station: normal Patient leans: N/A  Psychiatric Specialty Exam: Physical Exam  ROS  Blood pressure 105/64, pulse 93, temperature 98 F (36.7 C), temperature source Oral, resp. rate 16, height 5\' 3"  (1.6 m), weight 61.2 kg, SpO2 98 %.Body mass index is 23.91 kg/m.  General Appearance: Casual  Eye Contact:  Poor  Speech:  Slow  Volume:  Decreased  Mood:  Dysphoric and Irritable  Affect:  Congruent  Thought Process:  Goal Directed and Descriptions of Associations: Intact  Orientation:  Full (Time, Place, and Person)  Thought Content:  Logical and denies auditory or visual hallucinations just irritable and refusing to answer many questions  Suicidal Thoughts:  No  Homicidal Thoughts:  No  Memory:  Immediate;   Fair Recent;   Fair Remote;   Fair  Judgement: Probably poor at baseline  Insight: accepting that substance abuse is her primary issue at this point  Psychomotor Activity:  Normal   Concentration:  Concentration: Fair  Recall:  Poor  Fund of Knowledge:  Poor  Language:  Poor  Akathisia:  Negative  Handed:  Right  AIMS (if indicated):     Assets:  Physical Health Resilience  ADL's:  Intact  Cognition:  WNL  Sleep:        Treatment Plan Summary: Daily contact with patient to assess and evaluate symptoms and progress in treatment and Medication management  For mood disorder with psychosis escalate lamotrigine and change Risperdal to all at bedtime due to her complaint of fatigue no change in precautions continue current monitoring and probable discharge within 24 to 48 hours  Wanya Bangura, MD 05/17/2019, 7:58 AM

## 2019-05-17 NOTE — Care Management (Signed)
CMA spoke to [place] in Admissions with [facility]. At this time, patients are to follow up with facility regarding possible admission. CMA provided patient's name and contact information. CMA will also send out referral.   CMA will notify CSW, Ardelle Anton.       Laci Frenkel Care Management Assistant  Email:[email or web address] Office: [phone]

## 2019-05-17 NOTE — Progress Notes (Signed)
Recreation Therapy Notes  Date: 11.2.20 Time: 1000 Location: 500 Hall Dayroom  Group Topic: Goal Setting  Goal Area(s) Addresses:  Patient will be able to identify at least 3 life goals.  Patient will be able to identify benefit of investing in life goals.  Patient will be able to identify benefit of setting life goals.   Intervention: Worksheet, pencils  Activity: Goal Planning.  Patients were to identify goals they want to accomplish in the next week, month, year and 5 years.  Patients were to then identify obstacles to reaching goals, what would be needed to reach those goals and what they can start doing now to work towards goals.  Education:  Discharge Planning, Radiographer, therapeutic, Leisure Education   Education Outcome: Acknowledges Education/In Group Clarification Provided/Needs Additional Education  Clinical Observations: Pt did not attend group.    [name], LRT/CTRS         [name] A 05/17/2019 11:53 AM

## 2019-05-18 LAB — URINE CULTURE: Culture: <10000 — AB

## 2019-05-18 NOTE — Progress Notes (Signed)
Recreation Therapy Notes  INPATIENT RECREATION THERAPY ASSESSMENT  Patient Details Name: Julia [name] MRN: [id number] DOB: Sep 16, 2000 Today's Date: 05/18/2019       Information Obtained From: Patient  Able to Participate in Assessment/Interview: Yes  Patient Presentation: Alert  Reason for Admission (Per Patient): Patient Unable to Identify  Patient Stressors: Family, Friends, Work, Other (Comment)(Finances)  Coping Skills:   Building control surveyor, Journal, Arguments, Music, Substance Abuse, Impulsivity, Talk, Prayer, Avoidance, Read, Hot Bath/Shower  Leisure Interests (2+):  Individual - Other (Comment), Nature - Other (Comment)(Lay down; Be outside)  Frequency of Recreation/Participation: Weekly  Awareness of Community Resources:  No  Expressed Interest in [place]: No  [place] of Residence:  [place]  Patient Main Form of Transportation: (None identified)  Patient Strengths:  Strong person  Patient Identified Areas of Improvement:  "A lot of areas"  Patient Goal for Hospitalization:  "get back into life, think about other aspects of life"  Current SI (including self-harm):  No  Current HI:  No  Current AVH: No  Staff Intervention Plan: Group Attendance, Collaborate with Interdisciplinary Treatment Team  Consent to Intern Participation: N/A    [name], LRT/CTRS  Ria Comment, [place] 05/18/2019, 11:52 AM

## 2019-05-18 NOTE — Progress Notes (Signed)
Psychoeducational Group Note  Date:  05/18/2019 Time:  2036  Group Topic/Focus:  Wrap-Up Group:   The focus of this group is to help patients review their daily goal of treatment and discuss progress on daily workbooks.  Participation Level: Did Not Attend  Participation Quality:  Not Applicable  Affect:  Not Applicable  Cognitive:  Not Applicable  Insight:  Not Applicable  Engagement in Group: Not Applicable  Additional Comments:  The patient did not attend group this evening.   Archie Balboa S 05/18/2019, 8:26 PM

## 2019-05-18 NOTE — Progress Notes (Signed)
Pt observed in the dayroom playing cards with peer at the beginning of the shift. Pt complained of anxiety and vistaril 25 mg PO was given with good relief. Pt slept through the night without waking up, pt denies SI/HI, will continue to monitor.

## 2019-05-18 NOTE — Progress Notes (Signed)
[facility] MD Progress Note  05/18/2019 11:06 AM Julia [name]  MRN:  [id number] Subjective: Patient is a 18 year old female with a past psychiatric history significant for cocaine and alcohol use who presented on 11/1 seeking treatment for her substance abuse issues.  The patient had been recently hospitalized at the behavioral health hospital in August 2020 with similar circumstances.  Objective: Patient is seen and examined.  Patient is a 18 year old female with the above-stated past psychiatric history who is seen in follow-up.  The patient stated that she just feels overwhelmed.  She stated that she is homeless, she is unable to contact any of her previous friends because of their drug and alcohol use, and overall she feels quite alone.  She stated that she feels as though the only way that things will improve for her if she is able to get into a rehabilitation facility.  She denied any cravings or withdrawal symptoms currently.  She denied any suicidal or homicidal ideation.  She denied any auditory or visual hallucinations.  Her current medications include hydroxyzine, Lamictal, lorazepam and Risperdal.  Review of her laboratories on admission showed a mildly low potassium at 3.4, normal liver function enzymes, blood alcohol of less than 10, and a drug screen positive for marijuana and cocaine.  Her vital signs are stable, she is afebrile.  Her last CIWA this a.m. was 1.  She slept 6.75 hours last night.  Principal Problem: <principal problem not specified> Diagnosis: Active Problems:   Mood disorder with psychosis (HCC)   Cocaine dependence with cocaine-induced psychotic disorder with complication ([place])  Total Time spent with patient: 20 minutes  Past Psychiatric History: See admission H&P  Past Medical History:  Past Medical History:  Diagnosis Date  . Burning with urination 05/03/2015  . Contraceptive management 05/03/2015  . Depression   . Dysmenorrhea 12/30/2013  . Heroin addiction ([place])    . Menorrhagia 12/30/2013  . Menstrual extraction 12/30/2013  . Migraines   . Social anxiety disorder 09/25/2016  . Vaginal odor 05/03/2015    Past Surgical History:  Procedure Laterality Date  . NO PAST SURGERIES     Family History:  Family History  Problem Relation Age of Onset  . Depression Mother   . Hypertension Father   . Hyperlipidemia Father   . Cancer Paternal Grandmother        breast, uterine  . Cirrhosis Paternal Grandfather        due to alcohol   Family Psychiatric  History: See admission H&P Social History:  Social History   Substance and Sexual Activity  Alcohol Use Yes     Social History   Substance and Sexual Activity  Drug Use Not Currently  . Types: Marijuana, Cocaine, Oxycodone   Comment: opitates, xanax, canned air    Social History   Socioeconomic History  . Marital status: Single    Spouse name: Not on file  . Number of children: Not on file  . Years of education: Not on file  . Highest education level: Not on file  Occupational History  . Not on file  Social Needs  . Financial resource strain: Not on file  . Food insecurity    Worry: Not on file    Inability: Not on file  . Transportation needs    Medical: Not on file    Non-medical: Not on file  Tobacco Use  . Smoking status: Current Every Day Smoker    Packs/day: 0.25    Types: Cigarettes  . Smokeless  tobacco: Never Used  . Tobacco comment: parents smoke   Substance and Sexual Activity  . Alcohol use: Yes  . Drug use: Not Currently    Types: Marijuana, Cocaine, Oxycodone    Comment: opitates, xanax, canned air  . Sexual activity: Yes    Birth control/protection: Implant  Lifestyle  . Physical activity    Days per week: Not on file    Minutes per session: Not on file  . Stress: Not on file  Relationships  . Social Herbalist on phone: Not on file    Gets together: Not on file    Attends religious service: Not on file    Active member of club or organization:  Not on file    Attends meetings of clubs or organizations: Not on file    Relationship status: Not on file  Other Topics Concern  . Not on file  Social History Narrative   Lives with Dad. Mom has LGD, lives in [place]. 11th grader. Dog.   Additional Social History:                         Sleep: Good  Appetite:  Good  Current Medications: Current Facility-Administered Medications  Medication Dose Route Frequency Provider Last Rate Last Dose  . acetaminophen (TYLENOL) tablet 650 mg  650 mg Oral Q6H PRN Suella Broad, FNP      . alum & mag hydroxide-simeth (MAALOX/MYLANTA) 200-200-20 MG/5ML suspension 30 mL  30 mL Oral Q4H PRN Burt Ek, Gayland Curry, FNP      . hydrOXYzine (ATARAX/VISTARIL) tablet 25 mg  25 mg Oral Q6H PRN Cobos, Myer Peer, MD   25 mg at 05/17/19 2023  . influenza vac split quadrivalent PF (FLUARIX) injection 0.5 mL  0.5 mL Intramuscular Tomorrow-1000 [name], MD      . lamoTRIgine (LAMICTAL) tablet 25 mg  25 mg Oral BID [name], MD   25 mg at 05/18/19 0800  . LORazepam (ATIVAN) tablet 1 mg  1 mg Oral Q6H PRN Cobos, Myer Peer, MD   1 mg at 05/16/19 1620  . multivitamin with minerals tablet 1 tablet  1 tablet Oral Daily Cobos, Myer Peer, MD   1 tablet at 05/18/19 0800  . nicotine (NICODERM CQ - dosed in mg/24 hours) patch 21 mg  21 mg Transdermal Daily Cobos, Myer Peer, MD   21 mg at 05/18/19 0943  . OLANZapine zydis (ZYPREXA) disintegrating tablet 5 mg  5 mg Oral Q8H PRN Cobos, Myer Peer, MD   5 mg at 05/17/19 0847   And  . ziprasidone (GEODON) injection 20 mg  20 mg Intramuscular PRN Cobos, Myer Peer, MD      . pneumococcal 23 valent vaccine (PNU-IMMUNE) injection 0.5 mL  0.5 mL Intramuscular Tomorrow-1000 [name], MD      . risperiDONE (RISPERDAL) tablet 2 mg  2 mg Oral QHS [name], MD   2 mg at 05/17/19 2021  . thiamine (VITAMIN B-1) tablet 100 mg  100 mg Oral Daily Cobos, Myer Peer, MD   100 mg at 05/18/19 0800    Lab  Results:  Results for orders placed or performed during the hospital encounter of 05/16/19 (from the past 48 hour(s))  Urine Culture     Status: None (Preliminary result)   Collection Time: 05/16/19  2:30 PM   Specimen: Urine, Clean Catch  Result Value Ref Range   Specimen Description      URINE, CLEAN  CATCH Performed at [facility], [place] [address], [place], [place] [address]    Special Requests      NONE Performed at [facility], [place] [address] [place], [place] [address]    Culture PENDING    Report Status PENDING   Hemoglobin A1c     Status: None   Collection Time: 05/17/19  6:22 AM  Result Value Ref Range   Hgb A1c MFr Bld 5.2 4.8 - 5.6 %    Comment: (NOTE) Pre diabetes:          5.7%-6.4% Diabetes:              >6.4% Glycemic control for   <7.0% adults with diabetes    Mean Plasma Glucose 102.54 mg/dL    Comment: Performed at [facility] [address] [place], [place] [address]  Lipid panel     Status: Abnormal   Collection Time: 05/17/19  6:22 AM  Result Value Ref Range   Cholesterol 144 0 - 200 mg/dL   Triglycerides 65 <150 mg/dL   HDL 35 (L) >40 mg/dL   Total CHOL/HDL Ratio 4.1 RATIO   VLDL 13 0 - 40 mg/dL   LDL Cholesterol 96 0 - 99 mg/dL    Comment:        Total Cholesterol/HDL:CHD Risk Coronary Heart Disease Risk Table                     Men   Women  1/2 Average Risk   3.4   3.3  Average Risk       5.0   4.4  2 X Average Risk   9.6   7.1  3 X Average Risk  23.4   11.0        Use the calculated Patient Ratio above and the CHD Risk Table to determine the patient's CHD Risk.        ATP III CLASSIFICATION (LDL):  <100     mg/dL   Optimal  100-129  mg/dL   Near or Above                    Optimal  130-159  mg/dL   Borderline  160-189  mg/dL   High  >190     mg/dL   Very High Performed at [facility] [address] [place], [place] [address]   TSH     Status: None   Collection Time: 05/17/19   6:22 AM  Result Value Ref Range   TSH 1.981 0.350 - 4.500 uIU/mL    Comment: Performed by a 3rd Generation assay with a functional sensitivity of <=0.01 uIU/mL. Performed at [facility], [place] [address] [place], [place] [address]     Blood Alcohol level:  Lab Results  Component Value Date   [facility] <10 05/14/2019   ETH <10 123XX123    Metabolic Disorder Labs: Lab Results  Component Value Date   HGBA1C 5.2 05/17/2019   MPG 102.54 05/17/2019   MPG 108.28 02/17/2019   Lab Results  Component Value Date   PROLACTIN 31.9 (H) 02/17/2019   PROLACTIN 29.6 (H) 09/25/2016   Lab Results  Component Value Date   CHOL 144 05/17/2019   TRIG 65 05/17/2019   HDL 35 (L) 05/17/2019   CHOLHDL 4.1 05/17/2019   VLDL 13 05/17/2019   LDLCALC 96 05/17/2019   LDLCALC 76 02/17/2019    Physical Findings: AIMS:  , ,  ,  ,  CIWA:  CIWA-Ar Total: 1 COWS:     Musculoskeletal: Strength & Muscle Tone: within normal limits Gait & Station: normal Patient leans: N/A  Psychiatric Specialty Exam: Physical Exam  Nursing note and vitals reviewed. Constitutional: She is oriented to person, place, and time. She appears well-developed and well-nourished.  HENT:  Head: Normocephalic and atraumatic.  Respiratory: Effort normal.  Neurological: She is alert and oriented to person, place, and time.    ROS  Blood pressure 114/72, pulse (!) 123, temperature 98.5 F (36.9 C), temperature source Oral, resp. rate 16, height 5\' 3"  (1.6 m), weight 61.2 kg, SpO2 98 %.Body mass index is 23.91 kg/m.  General Appearance: Casual  Eye Contact:  Fair  Speech:  Normal Rate  Volume:  Normal  Mood:  Anxious  Affect:  Congruent  Thought Process:  Coherent and Descriptions of Associations: Circumstantial  Orientation:  Full (Time, Place, and Person)  Thought Content:  Logical  Suicidal Thoughts:  No  Homicidal Thoughts:  No  Memory:  Immediate;   Fair Recent;   Fair Remote;   Fair   Judgement:  Intact  Insight:  Lacking  Psychomotor Activity:  Increased  Concentration:  Concentration: Fair and Attention Span: Fair  Recall:  [employer] of Knowledge:  Fair  Language:  Fair  Akathisia:  Negative  Handed:  Right  AIMS (if indicated):     Assets:  Desire for Improvement Resilience  ADL's:  Intact  Cognition:  WNL  Sleep:  Number of Hours: 6.75     Treatment Plan Summary: Daily contact with patient to assess and evaluate symptoms and progress in treatment, Medication management and Plan : Patient is seen and examined.  Patient is a 18 year old female with the above-stated past psychiatric history who is seen in follow-up.   Diagnosis: #1 polysubstance abuse/dependency, #2 substance-induced mood disorder; depressed type with psychotic features, #3 mood disorder with psychosis, #4 cocaine dependence with cocaine induced psychotic disorder.  Patient is seen in follow-up.  She seems to be doing better from a psychiatric perspective.  I will discuss with social work today options available for her for residential drug treatment.  I will also encourage the patient to contact local [facility] and other halfway houses that might be of benefit if there is a waiting list to get into a rehabilitation facility.  No change in her current medications today. 1.  Continue Lamictal 25 mg p.o. twice daily for mood stability. 2.  Continue hydroxyzine 25 mg p.o. every 6 hours as needed anxiety. 3.  Stop lorazepam for CIWA. 4.  Continue multivitamin 1 tablet p.o. daily for nutritional supplementation. 5.  Continue Risperdal 2 mg p.o. nightly for mood stability and insomnia. 6.  Continue thiamine 100 mg p.o. daily for nutritional supplementation. 7.  Disposition planning-in progress.  Sharma Covert, MD 05/18/2019, 11:06 AM

## 2019-05-18 NOTE — Progress Notes (Signed)
DAR NOTE:   D-Pt is alert and oriented x4. Pt  denies HI/SI/AVH.  Pt stated that her depression was 0/10 but her anxiety was 8/10.  Pt reported that she slept well last night. Patient denied having any pain. Pt stated that her goal is to "work on my personality by talking to people."    A-RN actively listened to pt describe her feelings.  RN provided support and encouragement.  RN administered pt her medications as prescribed by pt's provider.  Maintained q15 min safety checks.   R-Pt continues to stay in her room a lot and this RN did not see pt  interacting with peers on the unit.  Pt continues to report that she feels anxious and she has been taking anti-anxiety medication as needed.  Pt remains safe on the unit.  Pt stated that she would let RN know if pt needs assistance.

## 2019-05-18 NOTE — Progress Notes (Signed)
Recreation Therapy Notes  Date: 11.3.20 Time: 1000 Location: 500 Hall Dayroom  Group Topic: Anger Management  Goal Area(s) Addresses:  Patient will identify triggers for anger.  Patient will identify a situation that makes them angry.  Patient will identify what other emotions comes with anger.   Behavioral Response:  None  Intervention: Worksheet, pencils  Activity: Introduction to Anger Management.  Patients were to identify three things that get them angry, what they do when they're angry and what problems they have run into because of anger.  Education: Anger Management, Discharge Planning   Education Outcome: Acknowledges education/In group clarification offered/Needs additional education.   Clinical Observations/Feedback: Pt stayed in group for a short time.  While in group, pt didn't fill out her sheet.  Pt was seen smiling at inappropriate times.     [name], LRT/CTRS         [name] A 05/18/2019 11:28 AM

## 2019-05-18 NOTE — Progress Notes (Signed)
   05/18/19 2300  Psych Admission Type (Psych Patients Only)  Admission Status Voluntary  Psychosocial Assessment  Patient Complaints Worrying;Anxiety  Eye Contact Staring  Facial Expression Masked  Affect Blunted  Speech Logical/coherent  Interaction Assertive  Motor Activity Other (Comment) (wdl)  Appearance/Hygiene Improved  Behavior Characteristics Anxious  Mood Anxious  Thought Process  Coherency Blocking  Content WDL  Delusions None reported or observed  Perception WDL  Hallucination None reported or observed  Judgment WDL  Confusion None  Danger to Self  Current suicidal ideation? Denies  Danger to Others  Danger to Others None reported or observed   Pt somatic this evening, pt stated she was anxious. Pt had minimal interaction on the unit this evening.
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
## 2019-05-19 NOTE — Progress Notes (Signed)
Recreation Therapy Notes  Date: 11.4.20 Time: 1000 Location: 500 Hall Dayroom   Group Topic: Communication, Team Building, Problem Solving  Goal Area(s) Addresses:  Patient will effectively work with peer towards shared goal.  Patient will identify skill used to make activity successful.  Patient will identify how skills used during activity can be used to reach post d/c goals.   Behavioral Response: None  Intervention: STEM Activity   Activity: [employer]. Patients were provided the following materials: 5 drinking straws, 5 rubber bands, 5 paper clips, 2 index cards and 2 drinking cups. Using the provided materials patients were asked to build a launching mechanisms to launch a ping pong ball approximately 12 feet. Patients were divided into teams of 3-5.   Education: Education officer, community, Dentist.   Education Outcome: Acknowledges education/In group clarification offered/Needs additional education.   Clinical Observations/Feedback:  Pt came to group for a while and observed.  Pt left early and did not return.    [name], LRT/CTRS    [name] A 05/19/2019 12:33 PM

## 2019-05-19 NOTE — Care Management (Signed)
CMA spoke to [name], Investment banker, operational for [employer] [name]). Patient has been accepted to program for Friday 11/6.   Bus transportation will be provided for patient on Friday at 1:00p. Patient will arrive to [facility] at 3:10p. Per Merry Proud, a staff member from the program will pick up patient from the bus stop.   Per Merry Proud, patient will be set up for outpatient services with [facility]. Patient will need to bring her belongings and 30-day supply of medications.    CMA has notified CSW, Ardelle Anton.     [name] Care Management Assistant  Email:[email or web address] Office: [phone]

## 2019-05-19 NOTE — [facility] Group Notes (Signed)
[facility] LCSW Group Therapy Note  Date/Time: 1:30pm 05/19/2019  Type of Therapy/Topic:  Group Therapy:  Feelings about Diagnosis  Participation Level:  None   Mood: Pleasant    Description of Group:    This group will allow patients to explore their thoughts and feelings about diagnoses they have received. Patients will be guided to explore their level of understanding and acceptance of these diagnoses. Facilitator will encourage patients to process their thoughts and feelings about the reactions of others to their diagnosis, and will guide patients in identifying ways to discuss their diagnosis with significant others in their lives. This group will be process-oriented, with patients participating in exploration of their own experiences as well as giving and receiving support and challenge from other group members.   Therapeutic Goals: 1. Patient will demonstrate understanding of diagnosis as evidence by identifying two or more symptoms of the disorder:  2. Patient will be able to express two feelings regarding the diagnosis 3. Patient will demonstrate ability to communicate their needs through discussion and/or role plays  Summary of Patient Progress:  Pt attended group and was pleasant. Pt did not participate and left group early.       Therapeutic Modalities:   Cognitive Behavioral Therapy Brief Therapy Feelings Identification   Ovidio Kin, MSW intern

## 2019-05-19 NOTE — Progress Notes (Signed)
[facility] MD Progress Note  05/19/2019 8:08 AM Julia [name]  MRN:  [id number] Subjective:   Patient is in bed she states "my OCD is going crazy" but she cannot be more specific she acknowledges homelessness denies suicidal thoughts plans or intent probable discharge tomorrow we will discuss with social work Principal Problem: Substance abuse/homelessness Diagnosis: Active Problems:   Mood disorder with psychosis ([place])   Cocaine dependence with cocaine-induced psychotic disorder with complication ([place])  Total Time spent with patient: 20 minutes  Past Psychiatric History: Recently here with a similar presentation  Past Medical History:  Past Medical History:  Diagnosis Date  . Burning with urination 05/03/2015  . Contraceptive management 05/03/2015  . Depression   . Dysmenorrhea 12/30/2013  . Heroin addiction ([place])   . Menorrhagia 12/30/2013  . Menstrual extraction 12/30/2013  . Migraines   . Social anxiety disorder 09/25/2016  . Vaginal odor 05/03/2015    Past Surgical History:  Procedure Laterality Date  . NO PAST SURGERIES     Family History:  Family History  Problem Relation Age of Onset  . Depression Mother   . Hypertension Father   . Hyperlipidemia Father   . Cancer Paternal Grandmother        breast, uterine  . Cirrhosis Paternal Grandfather        due to alcohol   Family Psychiatric  History: No new data Social History:  Social History   Substance and Sexual Activity  Alcohol Use Yes     Social History   Substance and Sexual Activity  Drug Use Not Currently  . Types: Marijuana, Cocaine, Oxycodone   Comment: opitates, xanax, canned air    Social History   Socioeconomic History  . Marital status: Single    Spouse name: Not on file  . Number of children: Not on file  . Years of education: Not on file  . Highest education level: Not on file  Occupational History  . Not on file  Social Needs  . Financial resource strain: Not on file  . Food insecurity   Worry: Not on file    Inability: Not on file  . Transportation needs    Medical: Not on file    Non-medical: Not on file  Tobacco Use  . Smoking status: Current Every Day Smoker    Packs/day: 0.25    Types: Cigarettes  . Smokeless tobacco: Never Used  . Tobacco comment: parents smoke   Substance and Sexual Activity  . Alcohol use: Yes  . Drug use: Not Currently    Types: Marijuana, Cocaine, Oxycodone    Comment: opitates, xanax, canned air  . Sexual activity: Yes    Birth control/protection: Implant  Lifestyle  . Physical activity    Days per week: Not on file    Minutes per session: Not on file  . Stress: Not on file  Relationships  . Social Herbalist on phone: Not on file    Gets together: Not on file    Attends religious service: Not on file    Active member of club or organization: Not on file    Attends meetings of clubs or organizations: Not on file    Relationship status: Not on file  Other Topics Concern  . Not on file  Social History Narrative   Lives with Dad. Mom has LGD, lives in [place]. 11th grader. Dog.   Additional Social History:  Sleep: Fair  Appetite:  Fair  Current Medications: Current Facility-Administered Medications  Medication Dose Route Frequency Provider Last Rate Last Dose  . acetaminophen (TYLENOL) tablet 650 mg  650 mg Oral Q6H PRN Suella Broad, FNP   650 mg at 05/18/19 2052  . alum & mag hydroxide-simeth (MAALOX/MYLANTA) 200-200-20 MG/5ML suspension 30 mL  30 mL Oral Q4H PRN Burt Ek, Gayland Curry, FNP      . hydrOXYzine (ATARAX/VISTARIL) tablet 25 mg  25 mg Oral Q6H PRN Cobos, Myer Peer, MD   25 mg at 05/18/19 1752  . influenza vac split quadrivalent PF (FLUARIX) injection 0.5 mL  0.5 mL Intramuscular Tomorrow-1000 [name], MD      . lamoTRIgine (LAMICTAL) tablet 25 mg  25 mg Oral BID [name], MD   25 mg at 05/18/19 0800  . LORazepam (ATIVAN) tablet 1 mg  1 mg Oral Q6H PRN  Cobos, Myer Peer, MD   1 mg at 05/16/19 1620  . multivitamin with minerals tablet 1 tablet  1 tablet Oral Daily Cobos, Myer Peer, MD   1 tablet at 05/18/19 0800  . nicotine (NICODERM CQ - dosed in mg/24 hours) patch 21 mg  21 mg Transdermal Daily Cobos, Myer Peer, MD   21 mg at 05/18/19 0943  . OLANZapine zydis (ZYPREXA) disintegrating tablet 5 mg  5 mg Oral Q8H PRN Cobos, Myer Peer, MD   5 mg at 05/17/19 0847  . pneumococcal 23 valent vaccine (PNU-IMMUNE) injection 0.5 mL  0.5 mL Intramuscular Tomorrow-1000 [name], MD      . risperiDONE (RISPERDAL) tablet 2 mg  2 mg Oral QHS [name], MD   2 mg at 05/18/19 2052  . thiamine (VITAMIN B-1) tablet 100 mg  100 mg Oral Daily Cobos, Myer Peer, MD   100 mg at 05/18/19 0800    Lab Results: No results found for this or any previous visit (from the past 78 hour(s)).  Blood Alcohol level:  Lab Results  Component Value Date   ETH <10 05/14/2019   ETH <10 123XX123    Metabolic Disorder Labs: Lab Results  Component Value Date   HGBA1C 5.2 05/17/2019   MPG 102.54 05/17/2019   MPG 108.28 02/17/2019   Lab Results  Component Value Date   PROLACTIN 31.9 (H) 02/17/2019   PROLACTIN 29.6 (H) 09/25/2016   Lab Results  Component Value Date   CHOL 144 05/17/2019   TRIG 65 05/17/2019   HDL 35 (L) 05/17/2019   CHOLHDL 4.1 05/17/2019   VLDL 13 05/17/2019   LDLCALC 96 05/17/2019   LDLCALC 76 02/17/2019    Physical Findings: AIMS:  , ,  ,  ,    CIWA:  CIWA-Ar Total: 4 COWS:     Musculoskeletal: Strength & Muscle Tone: within normal limits Gait & Station: normal Patient leans: N/A  Psychiatric Specialty Exam: Physical Exam  ROS  Blood pressure 119/77, pulse (!) 161, temperature 97.8 F (36.6 C), temperature source Oral, resp. rate 18, height 5\' 3"  (1.6 m), weight 61.2 kg, SpO2 98 %.Body mass index is 23.91 kg/m.  General Appearance: Casual  Eye Contact:  Good  Speech:  Clear and Coherent  Volume:  Normal  Mood:   Dysphoric  Affect:  Congruent  Thought Process:  Coherent and Descriptions of Associations: Circumstantial  Orientation:  Full (Time, Place, and Person)  Thought Content:  Rumination  Suicidal Thoughts:  No  Homicidal Thoughts:  No  Memory:  Immediate;   Fair Recent;   Fair Remote;  Fair  Judgement:  Fair  Insight:  Fair  Psychomotor Activity:  Normal  Concentration:  Concentration: Fair and Attention Span: Fair  Recall:  [employer] of Knowledge:  Fair  Language:  Fair  Akathisia:  Negative  Handed:  Right  AIMS (if indicated):     Assets:  Leisure Time Physical Health  ADL's:  Intact  Cognition:  WNL  Sleep:  Number of Hours: 7.25     Treatment Plan Summary: Daily contact with patient to assess and evaluate symptoms and progress in treatment  Care to seek rehab no change in meds or precautions continue rehab and cognitive therapy  Halston Kintz, MD 05/19/2019, 8:08 AM

## 2019-05-19 NOTE — BHH Group Notes (Signed)
Pt did not attend wrap up group this evening. Pt was experiencing a panic attack during the time of group.

## 2019-05-19 NOTE — Progress Notes (Signed)
Pt up to the nursing station asking for Ativan. Pt focused on medications.

## 2019-05-19 NOTE — Progress Notes (Signed)
Patient ID: Julia [name], female   DOB: 2000-12-01, 18 y.o.   MRN: [id number]  CSW faxed over the patient's application to [facility] at [phone].  CSW emailed the admission coordinator, [name], to confirm the fax.

## 2019-05-20 MED ORDER — LAMOTRIGINE 25 MG PO TABS: 50.0000 mg | ORAL_TABLET | Freq: Two times a day (BID) | ORAL | 2 refills | Status: DC

## 2019-05-20 MED ORDER — QUETIAPINE FUMARATE 300 MG PO TABS
300.0000 mg | ORAL_TABLET | Freq: Every day | ORAL | Status: DC
  Filled 2019-05-20: qty 30

## 2019-05-20 MED ORDER — LAMOTRIGINE 100 MG PO TABS
50.0000 mg | ORAL_TABLET | Freq: Two times a day (BID) | ORAL | Status: DC
  Administered 2019-05-21: 08:00:00 50 mg via ORAL
  Filled 2019-05-20: qty 30
  Filled 2019-05-20: qty 1
  Filled 2019-05-20 (×2): qty 30

## 2019-05-20 MED ORDER — FLUOXETINE HCL 20 MG PO CAPS
20.0000 mg | ORAL_CAPSULE | Freq: Every day | ORAL | Status: DC
  Administered 2019-05-21: 08:00:00 20 mg via ORAL
  Filled 2019-05-20 (×2): qty 30
  Filled 2019-05-20: qty 1

## 2019-05-20 MED ORDER — QUETIAPINE FUMARATE 300 MG PO TABS: 300.0000 mg | ORAL_TABLET | Freq: Every day | ORAL | 2 refills | Status: DC

## 2019-05-20 MED ORDER — HYDROXYZINE HCL 25 MG PO TABS
25.0000 mg | ORAL_TABLET | Freq: Three times a day (TID) | ORAL | Status: DC | PRN
  Filled 2019-05-20: qty 40

## 2019-05-20 MED ORDER — IBUPROFEN 600 MG PO TABS
600.0000 mg | ORAL_TABLET | Freq: Four times a day (QID) | ORAL | Status: DC | PRN
  Filled 2019-05-20: qty 40

## 2019-05-20 MED ORDER — FLUOXETINE HCL 20 MG PO CAPS: 20.0000 mg | ORAL_CAPSULE | Freq: Every day | ORAL | 1 refills | Status: DC

## 2019-05-20 NOTE — Progress Notes (Signed)
Pt complained of panic attack, stating she can not continue like this. Pt had an episode of crying stating her family are worried about her and that she should do better. Pt given Zyprexa for agitation, pt went to bed and rest well after that, will continue to monitor.

## 2019-05-20 NOTE — Plan of Care (Signed)
Discharge postponed till tomorrow 11/6 prescriptions written
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
## 2019-05-20 NOTE — Progress Notes (Signed)
   05/20/19 2000  Psych Admission Type (Psych Patients Only)  Admission Status Voluntary  Psychosocial Assessment  Patient Complaints Anxiety  Eye Contact Staring  Facial Expression Masked  Affect Blunted  Speech Logical/coherent  Interaction Assertive  Motor Activity Other (Comment) (wdl)  Appearance/Hygiene Improved  Behavior Characteristics Anxious  Mood Anxious  Thought Process  Coherency Blocking  Content WDL  Delusions None reported or observed  Perception WDL  Hallucination None reported or observed  Judgment WDL  Confusion None  Danger to Self  Current suicidal ideation? Denies  Danger to Others  Danger to Others None reported or observed   Pt stated she was doing a little better. Pt guarded, kept to herself much of the evening.

## 2019-05-20 NOTE — Progress Notes (Signed)
D:  Patient denied SI and HI, contracts for safety.  Denied A/V hallucinations.   A:  Medications administered per MD orders.  Emotional support and encouragement given patient. R:  Safety maintained with 15 minute checks.  

## 2019-05-20 NOTE — Progress Notes (Signed)
Recreation Therapy Notes  Date: 11.5.20 Time: 0950 Location: [address] Day[address]  Group Topic: Communication  Goal Area(s) Addresses:  Patient will effectively communicate with peers in group.  Patient will verbalize benefit of healthy communication. Patient will verbalize positive effect of healthy communication on post d/c goals.  Patient will identify communication techniques that made activity effective for group.   Intervention: Geometric Drawings, pencils, blank paper  Activity: Geometrical Drawings.  Three patients would get the chance to describe a picture to the remainder of the group.  These three individuals could describe their drawings as detailed as possible.  The remainder of the group had to draw the pictures as described to them.  The remainder of the group could not ask any specific questions.  They could only as the presenter to repeat themselves.  Education: Communication, Discharge Planning  Education Outcome: Acknowledges understanding/In group clarification offered/Needs additional education.   Clinical Observations/Feedback:  Pt did not attend group.     [name], LRT/CTRS         [name] A 05/20/2019 12:33 PM

## 2019-05-20 NOTE — BHH Suicide Risk Assessment (Signed)
[facility] Discharge Suicide Risk Assessment   Principal Problem: <principal problem not specified> Discharge Diagnoses: Active Problems:   Mood disorder with psychosis ([place])   Cocaine dependence with cocaine-induced psychotic disorder with complication ([place])   Total Time spent with patient: 45 minutes Musculoskeletal: Strength & Muscle Tone: within normal limits Gait & Station: normal Patient leans: N/A  Psychiatric Specialty Exam: Physical Exam  ROS  Blood pressure 119/77, pulse (!) 161, temperature 97.8 F (36.6 C), temperature source Oral, resp. rate 18, height 5\' 3"  (1.6 m), weight 61.2 kg, SpO2 98 %.Body mass index is 23.91 kg/m.  General Appearance: Casual  Eye Contact:  Good  Speech:  Normal Rate  Volume:  Decreased  Mood:  Euthymic  Affect:  Congruent  Thought Process:  Coherent and Descriptions of Associations: Intact  Orientation:  Full (Time, Place, and Person)  Thought Content:  Logical  Suicidal Thoughts:  No  Homicidal Thoughts:  No  Memory:  Immediate;   Fair Recent;   Fair Remote;   Fair  Judgement:  Fair  Insight:  Fair  Psychomotor Activity:  Normal  Concentration:  Concentration: Fair and Attention Span: Fair  Recall:  [employer] of Knowledge:  Fair  Language:  Fair  Akathisia:  Negative  Handed:  Right  AIMS (if indicated):     Assets:  Communication Skills Desire for Improvement  ADL's:  Intact  Cognition:  WNL  Sleep:  Number of Hours: 6.25     Mental Status Per Nursing Assessment::   On Admission:  NA  Demographic Factors:  Unemployed  Loss Factors: Decrease in vocational status  Historical Factors: NA  Risk Reduction Factors:   Sense of responsibility to family and Religious beliefs about death  Continued Clinical Symptoms:  Previous Psychiatric Diagnoses and Treatments  Cognitive Features That Contribute To Risk:  None    Suicide Risk:  Minimal: No identifiable suicidal ideation.  Patients presenting with no risk factors  but with morbid ruminations; may be classified as minimal risk based on the severity of the depressive symptoms  Follow-up Information    [facility] Follow up.   Why: You have been accepted to the [facility] for Friday 11/6 at 1:00p.  Please bring your photo ID, all belongings, and current medications.  The program will set you up with [facility] for outpatient services.  Contact information: [address] [place], [place] [address] P: [phone] F: [phone]          Plan Of Care/Follow-up recommendations:  Activity:  full  Guillermina Shaft, MD 05/20/2019, 8:27 AM

## 2019-05-21 MED ORDER — LAMOTRIGINE 25 MG PO TABS: 50.0000 mg | ORAL_TABLET | Freq: Two times a day (BID) | ORAL | 0 refills | Status: DC

## 2019-05-21 NOTE — Progress Notes (Signed)
Patient is discharging today to a residential treatment program in [place].  Two copies of the bus ticket and contact information for the facility representatives were provided to patient's nurse. Patient has a Lyft scheduled for 12pm to allow her to make her 1pm bus to [place].  Plan reviewed with patient, patient is agreeable.  [name], LCSW-A Clinical Social Worker

## 2019-05-21 NOTE — [facility] Suicide Risk Assessment (Signed)
[facility] Discharge Suicide Risk Assessment   Principal Problem: <principal problem not specified> Discharge Diagnoses: Active Problems:   Mood disorder with psychosis ([place])   Cocaine dependence with cocaine-induced psychotic disorder with complication ([place])   Total Time spent with patient: 15 minutes  Musculoskeletal: Strength & Muscle Tone: within normal limits Gait & Station: normal Patient leans: N/A  Psychiatric Specialty Exam: Review of Systems  All other systems reviewed and are negative.   Blood pressure 108/86, pulse (!) 161, temperature 98.7 F (37.1 C), temperature source Oral, resp. rate 18, height 5\' 3"  (1.6 m), weight 61.2 kg, SpO2 98 %.Body mass index is 23.91 kg/m.  General Appearance: Casual  Eye Contact::  Fair  Speech:  Normal Rate409  Volume:  Normal  Mood:  Anxious  Affect:  Congruent  Thought Process:  Coherent and Descriptions of Associations: Circumstantial  Orientation:  Full (Time, Place, and Person)  Thought Content:  Logical  Suicidal Thoughts:  No  Homicidal Thoughts:  No  Memory:  Immediate;   Fair Recent;   Fair Remote;   Fair  Judgement:  Intact  Insight:  Fair  Psychomotor Activity:  Normal  Concentration:  Fair  Recall:  [employer] of Knowledge:Fair  Language: Good  Akathisia:  Negative  Handed:  Right  AIMS (if indicated):     Assets:  Desire for Improvement Resilience  Sleep:  Number of Hours: 6.25  Cognition: WNL  ADL's:  Intact   Mental Status Per Nursing Assessment::   On Admission:  NA  Demographic Factors:  Adolescent or young adult, Caucasian and Unemployed  Loss Factors: NA  Historical Factors: Impulsivity  Risk Reduction Factors:   NA  Continued Clinical Symptoms:  Depression:   Comorbid alcohol abuse/dependence Impulsivity Alcohol/Substance Abuse/Dependencies  Cognitive Features That Contribute To Risk:  None    Suicide Risk:  Minimal: No identifiable suicidal ideation.  Patients presenting with no  risk factors but with morbid ruminations; may be classified as minimal risk based on the severity of the depressive symptoms  Follow-up Information    [facility] Follow up.   Why: You have been accepted to the [facility] for Friday 11/6 at 1:00p.  Please bring your photo ID, all belongings, and current medications.  The program will set you up with [facility] for outpatient services.  Contact information: [address] [place], [place] [address] P: [phone] F: [phone]          Plan Of Care/Follow-up recommendations:  Activity:  ad lib  Sharma Covert, MD 05/21/2019, 11:26 AM

## 2019-05-21 NOTE — Progress Notes (Signed)
RN met with pt and reviewed pt's discharge instructions.  Pt verbalized understanding of discharge instructions and pt did not have any questions. RN returned pt's belongings ( from locker 44)  to pt.  Pt stated that all of her belongings were present.   Prescriptions and samples were given to pt.  Pt denies SI/HI/AVH and voiced no concerns. Patient discharged to the lobby.
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
## 2019-05-21 NOTE — Progress Notes (Signed)
  [facility] Adult Case Management Discharge Plan :  Will you be returning to the same living situation after discharge:  No. Going to residential treatment. At discharge, do you have transportation home?: Yes,  Lyft and a bus ticket. Do you have the ability to pay for your medications: No. Provided samples.  Release of information consent forms completed and in the chart;.  Patient to Follow up at: Follow-up Information    [facility] Follow up.   Why: You have been accepted to the [facility] for Friday 11/6 at 1:00p.  Please bring your photo ID, all belongings, and current medications.  The program will set you up with [facility] for outpatient services.  Contact information: [address] [place], [place] [address] P: [phone] F: [phone]          Next level of care provider has access to [place] and Suicide Prevention discussed: Yes,  with patient. Patient declined consents.  Have you used any form of tobacco in the last 30 days? (Cigarettes, Smokeless Tobacco, Cigars, and/or Pipes): Yes  Has patient been referred to the Quitline?: Patient refused referral  Patient has been referred for addiction treatment: Yes  Joellen Jersey, [place] 05/21/2019, 11:29 AM

## 2019-05-21 NOTE — Tx Team (Signed)
Interdisciplinary Treatment and Diagnostic Plan Update  05/21/2019 Time of Session: 10:35am Julia [name] MRN: [id number]  Principal Diagnosis: <principal problem not specified>  Secondary Diagnoses: Active Problems:   Mood disorder with psychosis ([place])   Cocaine dependence with cocaine-induced psychotic disorder with complication ([place])   Current Medications:  Current Facility-Administered Medications  Medication Dose Route Frequency Provider Last Rate Last Dose  . acetaminophen (TYLENOL) tablet 650 mg  650 mg Oral Q6H PRN Suella Broad, FNP   650 mg at 05/19/19 1347  . alum & mag hydroxide-simeth (MAALOX/MYLANTA) 200-200-20 MG/5ML suspension 30 mL  30 mL Oral Q4H PRN Burt Ek, Gayland Curry, FNP      . FLUoxetine (PROZAC) capsule 20 mg  20 mg Oral Daily [name], MD   20 mg at 05/21/19 0825  . hydrOXYzine (ATARAX/VISTARIL) tablet 25 mg  25 mg Oral TID PRN [name], MD      . ibuprofen (ADVIL) tablet 600 mg  600 mg Oral Q6H PRN [name], MD      . influenza vac split quadrivalent PF (FLUARIX) injection 0.5 mL  0.5 mL Intramuscular Tomorrow-1000 [name], MD      . lamoTRIgine (LAMICTAL) tablet 25 mg  25 mg Oral BID [name], MD   25 mg at 05/21/19 0826  . lamoTRIgine (LAMICTAL) tablet 50 mg  50 mg Oral BID [name], MD   50 mg at 05/21/19 0826  . multivitamin with minerals tablet 1 tablet  1 tablet Oral Daily Cobos, Myer Peer, MD   1 tablet at 05/21/19 0825  . nicotine (NICODERM CQ - dosed in mg/24 hours) patch 21 mg  21 mg Transdermal Daily Cobos, Myer Peer, MD   21 mg at 05/20/19 0844  . OLANZapine zydis (ZYPREXA) disintegrating tablet 5 mg  5 mg Oral Q8H PRN Cobos, Myer Peer, MD   5 mg at 05/20/19 2142  . pneumococcal 23 valent vaccine (PNU-IMMUNE) injection 0.5 mL  0.5 mL Intramuscular Tomorrow-1000 [name], MD      . QUEtiapine (SEROQUEL) tablet 300 mg  300 mg Oral QHS [name], MD      . risperiDONE (RISPERDAL) tablet 2 mg  2 mg Oral QHS  [name], MD   2 mg at 05/20/19 2141  . thiamine (VITAMIN B-1) tablet 100 mg  100 mg Oral Daily Cobos, Myer Peer, MD   100 mg at 05/21/19 0825   PTA Medications: Medications Prior to Admission  Medication Sig Dispense Refill Last Dose  . etonogestrel (NEXPLANON) 68 MG IMPL implant 1 each (68 mg total) by Subdermal route once for 1 dose. (Birth control method) 1 each 0   . hydrOXYzine (ATARAX/VISTARIL) 25 MG tablet Take 1 tablet (25 mg total) by mouth 3 (three) times daily as needed for anxiety. (Patient not taking: Reported on 05/15/2019) 30 tablet 0   . ibuprofen (ADVIL) 600 MG tablet Take 1 tablet (600 mg total) by mouth every 6 (six) hours as needed. (Patient not taking: Reported on 05/15/2019) 30 tablet 0   . nicotine polacrilex (NICORETTE) 2 MG gum Take 1 each (2 mg total) by mouth as needed for smoking cessation. (Patient not taking: Reported on 05/15/2019) 100 tablet 0   . QUEtiapine (SEROQUEL) 50 MG tablet Take 50 mg by mouth 2 (two) times daily. Take 1 tab po 7am, 1 tab po 3pm.     . [DISCONTINUED] FLUoxetine (PROZAC) 20 MG capsule Take 1 capsule (20 mg total) by mouth daily. (Patient not taking: Reported on 05/15/2019) 30  capsule 0   . [DISCONTINUED] lamoTRIgine (LAMICTAL) 25 MG tablet Take 50 mg by mouth 2 (two) times daily. Take 2 tabs(50mg ) po qam, 2 tabs(50mg ) po qpm.     . [DISCONTINUED] QUEtiapine (SEROQUEL) 300 MG tablet Take 300 mg by mouth at bedtime.       Patient Stressors: Medication change or noncompliance Substance abuse  Patient Strengths: Average or above average intelligence Motivation for treatment/growth  Treatment Modalities: Medication Management, Group therapy, Case management,  1 to 1 session with clinician, Psychoeducation, Recreational therapy.   Physician Treatment Plan for Primary Diagnosis: <principal problem not specified> Long Term Goal(s): Improvement in symptoms so as ready for discharge Improvement in symptoms so as ready for discharge    Short Term Goals: Ability to identify changes in lifestyle to reduce recurrence of condition will improve Ability to disclose and discuss suicidal ideas Ability to identify and develop effective coping behaviors will improve Ability to identify triggers associated with substance abuse/mental health issues will improve Ability to verbalize feelings will improve Ability to disclose and discuss suicidal ideas Ability to identify and develop effective coping behaviors will improve Compliance with prescribed medications will improve Ability to identify triggers associated with substance abuse/mental health issues will improve  Medication Management: Evaluate patient's response, side effects, and tolerance of medication regimen.  Therapeutic Interventions: 1 to 1 sessions, Unit Group sessions and Medication administration.  Evaluation of Outcomes: Adequate for Discharge  Physician Treatment Plan for Secondary Diagnosis: Active Problems:   Mood disorder with psychosis ([place])   Cocaine dependence with cocaine-induced psychotic disorder with complication ([place])  Long Term Goal(s): Improvement in symptoms so as ready for discharge Improvement in symptoms so as ready for discharge   Short Term Goals: Ability to identify changes in lifestyle to reduce recurrence of condition will improve Ability to disclose and discuss suicidal ideas Ability to identify and develop effective coping behaviors will improve Ability to identify triggers associated with substance abuse/mental health issues will improve Ability to verbalize feelings will improve Ability to disclose and discuss suicidal ideas Ability to identify and develop effective coping behaviors will improve Compliance with prescribed medications will improve Ability to identify triggers associated with substance abuse/mental health issues will improve     Medication Management: Evaluate patient's response, side effects, and tolerance of  medication regimen.  Therapeutic Interventions: 1 to 1 sessions, Unit Group sessions and Medication administration.  Evaluation of Outcomes: Adequate for Discharge   RN Treatment Plan for Primary Diagnosis: <principal problem not specified> Long Term Goal(s): Knowledge of disease and therapeutic regimen to maintain health will improve  Short Term Goals: Ability to participate in decision making will improve and Ability to identify and develop effective coping behaviors will improve  Medication Management: RN will administer medications as ordered by provider, will assess and evaluate patient's response and provide education to patient for prescribed medication. RN will report any adverse and/or side effects to prescribing provider.  Therapeutic Interventions: 1 on 1 counseling sessions, Psychoeducation, Medication administration, Evaluate responses to treatment, Monitor vital signs and CBGs as ordered, Perform/monitor CIWA, COWS, AIMS and Fall Risk screenings as ordered, Perform wound care treatments as ordered.  Evaluation of Outcomes: Adequate for Discharge   LCSW Treatment Plan for Primary Diagnosis: <principal problem not specified> Long Term Goal(s): Safe transition to appropriate next level of care at discharge, Engage patient in therapeutic group addressing interpersonal concerns.  Short Term Goals: Engage patient in aftercare planning with referrals and resources and Increase skills for wellness and recovery  Therapeutic Interventions: Assess for all discharge needs, 1 to 1 time with Education officer, museum, Explore available resources and support systems, Assess for adequacy in community support network, Educate family and significant other(s) on suicide prevention, Complete Psychosocial Assessment, Interpersonal group therapy.  Evaluation of Outcomes: Adequate for Discharge   Progress in Treatment: Attending groups: Yes. Participating in groups: Yes. Taking medication as prescribed:  Yes. Toleration medication: Yes. Family/Significant other contact made: No, will contact:  pt declined; with pt Patient understands diagnosis: No. Discussing patient identified problems/goals with staff: Yes. Medical problems stabilized or resolved: Yes. Denies suicidal/homicidal ideation: Yes. Issues/concerns per patient self-inventory: No. Other:   New problem(s) identified: No, Describe:  None  New Short Term/Long Term Goal(s): Medication stabilization, elimination of SI thoughts, and development of a comprehensive mental wellness plan.   Patient Goals:  Patient did not attend treatment team. Patient was asleep.   Discharge Plan or Barriers: Patient has been accepted to [facility] for residential substance use treatment. Transportation was facilitated by CSW.  Reason for Continuation of Hospitalization: Depression  Estimated Length of Stay: discharging today  Attendees: Patient:  05/17/2019  Physician: Dr. [name], MD Dr.Clary 05/17/2019   Nursing: Benjamine Mola, RN 05/17/2019   RN Care Manager: 05/17/2019  Social Worker: Ardelle Anton, [place], [place] 05/17/2019   Recreational Therapist:  05/17/2019   Other:  05/17/2019   Other:  05/17/2019   Other: 05/17/2019     Scribe for Treatment Team: Joellen Jersey, [employer] 05/21/2019 11:43 AM

## 2019-05-21 NOTE — Progress Notes (Signed)
Recreation Therapy Notes  Date: 11.6.20 Time: 1000 Location: 500 Hall Dayroom  Group Topic: Communication, Team Building, Problem Solving  Goal Area(s) Addresses:  Patient will effectively work with peer towards shared goal.  Patient will identify skill used to make activity successful.  Patient will identify how skills used during activity can be used to reach post d/c goals.   Intervention: STEM Activity   Activity: [employer].  In groups, patients were given 12 pipe cleaner.  Patients were to build a free standing tower as tall as possible using just the pipe cleaner.  Throughout the activity, there were setbacks such as putting one hand behind their backs and not being able to talk.  Education: Education officer, community, Dentist.   Education Outcome: Acknowledges education  Clinical Observations/Feedback:  Pt did not attend group.    [name], LRT/CTRS         [name] A 05/21/2019 11:31 AM

## 2019-06-23 ENCOUNTER — Emergency Department (HOSPITAL_COMMUNITY)
Admission: EM | Admit: 2019-06-23 | Discharge: 2019-06-23 | Disposition: A | Discharge status: Other Acute Inpatient Hospital | Payer: Medicaid Other | Attending: Emergency Medicine | Admitting: Emergency Medicine

## 2019-06-23 ENCOUNTER — Inpatient Hospital Stay
Admit: 2019-06-23 | Discharge: 2019-06-29 | DRG: 885 | Disposition: A | Discharge status: Home / Self Care | Payer: Medicaid Other | Source: Intra-hospital | Attending: Psychiatry | Admitting: Psychiatry

## 2019-06-23 ENCOUNTER — Other Ambulatory Visit: Payer: Self-pay

## 2019-06-23 ENCOUNTER — Encounter (HOSPITAL_COMMUNITY): Payer: Self-pay | Admitting: Emergency Medicine

## 2019-06-23 DIAGNOSIS — Z818 Family history of other mental and behavioral disorders: Secondary | ICD-10-CM | POA: Diagnosis not present

## 2019-06-23 DIAGNOSIS — F333 Major depressive disorder, recurrent, severe with psychotic symptoms: Secondary | ICD-10-CM | POA: Insufficient documentation

## 2019-06-23 DIAGNOSIS — N390 Urinary tract infection, site not specified: Secondary | ICD-10-CM | POA: Diagnosis present

## 2019-06-23 DIAGNOSIS — G4700 Insomnia, unspecified: Secondary | ICD-10-CM | POA: Diagnosis present

## 2019-06-23 DIAGNOSIS — F419 Anxiety disorder, unspecified: Secondary | ICD-10-CM | POA: Diagnosis present

## 2019-06-23 DIAGNOSIS — F17210 Nicotine dependence, cigarettes, uncomplicated: Secondary | ICD-10-CM | POA: Diagnosis present

## 2019-06-23 DIAGNOSIS — Z915 Personal history of self-harm: Secondary | ICD-10-CM

## 2019-06-23 DIAGNOSIS — F29 Unspecified psychosis not due to a substance or known physiological condition: Secondary | ICD-10-CM | POA: Insufficient documentation

## 2019-06-23 DIAGNOSIS — F1910 Other psychoactive substance abuse, uncomplicated: Secondary | ICD-10-CM | POA: Diagnosis present

## 2019-06-23 DIAGNOSIS — Z79899 Other long term (current) drug therapy: Secondary | ICD-10-CM | POA: Diagnosis not present

## 2019-06-23 DIAGNOSIS — Z20828 Contact with and (suspected) exposure to other viral communicable diseases: Secondary | ICD-10-CM | POA: Diagnosis present

## 2019-06-23 DIAGNOSIS — Z0489 Encounter for examination and observation for other specified reasons: Secondary | ICD-10-CM | POA: Diagnosis present

## 2019-06-23 DIAGNOSIS — R45851 Suicidal ideations: Secondary | ICD-10-CM | POA: Diagnosis present

## 2019-06-23 LAB — URINALYSIS, ROUTINE W REFLEX MICROSCOPIC
Bilirubin Urine: NEGATIVE
Glucose, UA: NEGATIVE mg/dL
Hgb urine dipstick: NEGATIVE
Ketones, ur: NEGATIVE mg/dL
Nitrite: NEGATIVE
Protein, ur: NEGATIVE mg/dL
Specific Gravity, Urine: 1.01 (ref 1.005–1.030)
pH: 6 (ref 5.0–8.0)

## 2019-06-23 LAB — CBC
HCT: 43.3 % (ref 36.0–46.0)
Hemoglobin: 13.6 g/dL (ref 12.0–15.0)
MCH: 29.1 pg (ref 26.0–34.0)
MCHC: 31.4 g/dL (ref 30.0–36.0)
MCV: 92.7 fL (ref 80.0–100.0)
Platelets: 260 10*3/uL (ref 150–400)
RBC: 4.67 MIL/uL (ref 3.87–5.11)
RDW: 12.4 % (ref 11.5–15.5)
WBC: 7.5 10*3/uL (ref 4.0–10.5)
nRBC: 0 % (ref 0.0–0.2)

## 2019-06-23 LAB — RAPID URINE DRUG SCREEN, HOSP PERFORMED
Amphetamines: NOT DETECTED
Barbiturates: NOT DETECTED
Benzodiazepines: NOT DETECTED
Cocaine: NOT DETECTED
Opiates: NOT DETECTED
Tetrahydrocannabinol: NOT DETECTED

## 2019-06-23 LAB — COMPREHENSIVE METABOLIC PANEL
ALT: 58 U/L — ABNORMAL HIGH (ref 0–44)
AST: 23 U/L (ref 15–41)
Albumin: 4.5 g/dL (ref 3.5–5.0)
Alkaline Phosphatase: 70 U/L (ref 38–126)
Anion gap: 11 (ref 5–15)
BUN: 12 mg/dL (ref 6–20)
CO2: 25 mmol/L (ref 22–32)
Calcium: 9.7 mg/dL (ref 8.9–10.3)
Chloride: 102 mmol/L (ref 98–111)
Creatinine, Ser: 0.72 mg/dL (ref 0.44–1.00)
GFR calc Af Amer: >60 mL/min (ref >60)
GFR calc non Af Amer: >60 mL/min (ref >60)
Glucose, Bld: 112 mg/dL — ABNORMAL HIGH (ref 70–99)
Potassium: 3.8 mmol/L (ref 3.5–5.1)
Sodium: 138 mmol/L (ref 135–145)
Total Bilirubin: 0.4 mg/dL (ref 0.3–1.2)
Total Protein: 7.9 g/dL (ref 6.5–8.1)

## 2019-06-23 LAB — RESPIRATORY PANEL BY RT PCR (FLU A&B, COVID)
Influenza A by PCR: NEGATIVE
Influenza B by PCR: NEGATIVE
SARS Coronavirus 2 by RT PCR: NEGATIVE

## 2019-06-23 LAB — ETHANOL: Alcohol, Ethyl (B): <10 mg/dL (ref <10)

## 2019-06-23 LAB — I-STAT BETA HCG BLOOD, ED (MC, WL, AP ONLY): I-stat hCG, quantitative: <5 m[IU]/mL (ref <5)

## 2019-06-23 MED ORDER — TRAZODONE HCL 50 MG PO TABS
50.0000 mg | ORAL_TABLET | Freq: Every evening | ORAL | Status: DC | PRN
  Administered 2019-06-23 – 2019-06-25 (×3): 50 mg via ORAL
  Filled 2019-06-23 (×3): qty 1

## 2019-06-23 MED ORDER — DIPHENHYDRAMINE HCL 50 MG/ML IJ SOLN
50.0000 mg | Freq: Four times a day (QID) | INTRAMUSCULAR | Status: DC | PRN
  Filled 2019-06-23: qty 1

## 2019-06-23 MED ORDER — QUETIAPINE FUMARATE 100 MG PO TABS: 300.0000 mg | ORAL_TABLET | Freq: Every day | ORAL | Status: DC

## 2019-06-23 MED ORDER — ACETAMINOPHEN 325 MG PO TABS
650.0000 mg | ORAL_TABLET | Freq: Four times a day (QID) | ORAL | Status: DC | PRN
  Administered 2019-06-24 – 2019-06-26 (×3): 650 mg via ORAL
  Filled 2019-06-23 (×4): qty 2

## 2019-06-23 MED ORDER — MAGNESIUM HYDROXIDE 400 MG/5ML PO SUSP
30.0000 mL | Freq: Every day | ORAL | Status: DC | PRN
  Administered 2019-06-24 – 2019-06-25 (×2): 30 mL via ORAL
  Filled 2019-06-23 (×2): qty 30

## 2019-06-23 MED ORDER — HALOPERIDOL 5 MG PO TABS
5.0000 mg | ORAL_TABLET | Freq: Four times a day (QID) | ORAL | Status: DC | PRN
  Administered 2019-06-25 – 2019-06-26 (×2): 5 mg via ORAL
  Filled 2019-06-23 (×3): qty 1

## 2019-06-23 MED ORDER — LAMOTRIGINE 25 MG PO TABS
50.0000 mg | ORAL_TABLET | Freq: Two times a day (BID) | ORAL | Status: DC
  Administered 2019-06-23 – 2019-06-24 (×2): 50 mg via ORAL
  Filled 2019-06-23 (×2): qty 2

## 2019-06-23 MED ORDER — ALUM & MAG HYDROXIDE-SIMETH 200-200-20 MG/5ML PO SUSP: 30.0000 mL | ORAL | Status: DC | PRN

## 2019-06-23 MED ORDER — ZIPRASIDONE MESYLATE 20 MG IM SOLR: 20.0000 mg | INTRAMUSCULAR | Status: DC | PRN

## 2019-06-23 MED ORDER — LORAZEPAM 2 MG PO TABS
2.0000 mg | ORAL_TABLET | Freq: Four times a day (QID) | ORAL | Status: DC | PRN
  Administered 2019-06-24 – 2019-06-26 (×5): 2 mg via ORAL
  Filled 2019-06-23 (×5): qty 1

## 2019-06-23 MED ORDER — OLANZAPINE 5 MG PO TBDP
10.0000 mg | ORAL_TABLET | Freq: Three times a day (TID) | ORAL | Status: DC | PRN
  Administered 2019-06-23 (×2): 10 mg via ORAL
  Filled 2019-06-23 (×2): qty 2

## 2019-06-23 MED ORDER — HYDROXYZINE HCL 25 MG PO TABS
25.0000 mg | ORAL_TABLET | Freq: Three times a day (TID) | ORAL | Status: DC | PRN
  Administered 2019-06-23: 25 mg via ORAL
  Filled 2019-06-23: qty 1

## 2019-06-23 MED ORDER — LORAZEPAM 2 MG/ML IJ SOLN
2.0000 mg | Freq: Four times a day (QID) | INTRAMUSCULAR | Status: DC | PRN
  Administered 2019-06-25: 12:00:00 2 mg via INTRAMUSCULAR
  Filled 2019-06-23: qty 1

## 2019-06-23 MED ORDER — LORAZEPAM 1 MG PO TABS
1.0000 mg | ORAL_TABLET | ORAL | Status: AC | PRN
  Administered 2019-06-23: 1 mg via ORAL
  Filled 2019-06-23 (×2): qty 1

## 2019-06-23 MED ORDER — FLUOXETINE HCL 20 MG PO CAPS
20.0000 mg | ORAL_CAPSULE | Freq: Every day | ORAL | Status: DC
  Administered 2019-06-23: 20 mg via ORAL
  Filled 2019-06-23: qty 1

## 2019-06-23 MED ORDER — FLUOXETINE HCL 20 MG PO CAPS
20.0000 mg | ORAL_CAPSULE | Freq: Every day | ORAL | Status: DC
  Administered 2019-06-24 – 2019-06-26 (×3): 20 mg via ORAL
  Filled 2019-06-23 (×3): qty 1

## 2019-06-23 MED ORDER — HALOPERIDOL LACTATE 5 MG/ML IJ SOLN
5.0000 mg | Freq: Four times a day (QID) | INTRAMUSCULAR | Status: DC | PRN
  Filled 2019-06-23: qty 1

## 2019-06-23 MED ORDER — LAMOTRIGINE 25 MG PO TABS
50.0000 mg | ORAL_TABLET | Freq: Two times a day (BID) | ORAL | Status: DC
  Administered 2019-06-23: 50 mg via ORAL
  Filled 2019-06-23: qty 2

## 2019-06-23 MED ORDER — DIPHENHYDRAMINE HCL 25 MG PO CAPS
50.0000 mg | ORAL_CAPSULE | Freq: Four times a day (QID) | ORAL | Status: DC | PRN
  Administered 2019-06-24 – 2019-06-26 (×3): 50 mg via ORAL
  Filled 2019-06-23 (×3): qty 2

## 2019-06-23 MED ORDER — NICOTINE 21 MG/24HR TD PT24
21.0000 mg | MEDICATED_PATCH | Freq: Every day | TRANSDERMAL | Status: DC
  Administered 2019-06-24 – 2019-06-27 (×4): 21 mg via TRANSDERMAL
  Filled 2019-06-23 (×5): qty 1

## 2019-06-23 MED ORDER — NICOTINE 21 MG/24HR TD PT24
21.0000 mg | MEDICATED_PATCH | Freq: Every day | TRANSDERMAL | Status: DC
  Administered 2019-06-23: 21 mg via TRANSDERMAL
  Filled 2019-06-23: qty 1

## 2019-06-23 MED ORDER — QUETIAPINE FUMARATE 200 MG PO TABS
300.0000 mg | ORAL_TABLET | Freq: Every day | ORAL | Status: DC
  Administered 2019-06-23 – 2019-06-26 (×4): 300 mg via ORAL
  Filled 2019-06-23 (×4): qty 1

## 2019-06-23 NOTE — ED Notes (Signed)
Pt was given breakfast tray 

## 2019-06-23 NOTE — Plan of Care (Signed)
New Admission  Problem: Education: Goal: Knowledge of [facility] Education information/materials will improve Outcome: Not Progressing   Problem: Coping: Goal: Ability to verbalize frustrations and anger appropriately will improve Outcome: Not Progressing   Problem: Safety: Goal: Periods of time without injury will increase Outcome: Not Progressing   Problem: Coping: Goal: Ability to interact with others will improve Outcome: Not Progressing Goal: Demonstration of participation in decision-making regarding own care will improve Outcome: Not Progressing   Problem: Self-Concept: Goal: Ability to identify factors that promote anxiety will improve Outcome: Not Progressing Goal: Level of anxiety will decrease Outcome: Not Progressing Goal: Ability to modify response to factors that promote anxiety will improve Outcome: Not Progressing

## 2019-06-23 NOTE — Progress Notes (Addendum)
Pt self isolative at the beginning of this shift. Pt is in bed with the blanket over her head. Pt responded when called the third time with offers to come get her snacks and medications. Hs meds and snacks brought to the patients room after she requested to go back to bed when she came to the medication room. Pt stated she is "Ok" when asked how her day has been. Pt closed her eyes, pulled the blanket over her head and went back to sleep. Pt denies SI/HI/AVH. Q 15 minute safety checks maintained.

## 2019-06-23 NOTE — ED Triage Notes (Signed)
Pt states she feels someone is after her and she states she has not been sleeping well. Pt is very tearful, but denies si. She also denies auditory hallucinations.

## 2019-06-23 NOTE — BH Assessment (Signed)
Patient has been accepted to [facility] NP is [facility].  Attending Physician will be Dr. Weber Cooks.  Patient has been assigned to room 322, by [place].  Call report to [phone].  Representative/Transfer Coordinator is Merchandiser, retail Patient pre-admitted by [facility] Patient Access Mateo Flow)  [facility] Staff Judson Roch, [place]) made aware of acceptance.

## 2019-06-23 NOTE — ED Notes (Signed)
Clinical research associate for transfer to [facility].  Someone will call back with an ETA.

## 2019-06-23 NOTE — ED Notes (Signed)
Pt was given lunch tray.  

## 2019-06-23 NOTE — ED Provider Notes (Signed)
Emergency Medicine Observation Re-evaluation Note  ADALAIDE GUIDERA is a 18 y.o. female, seen on rounds today.  Pt initially presented to the ED for complaints of Medical Clearance Currently, the patient is sitting in her bed, tearful.  She would not answer any HPI questions to the provider  Physical Exam  BP 125/85   Pulse 91   Temp 98.1 F (36.7 C)   Resp (!) 22   Wt 62 kg   SpO2 100%   BMI 24.21 kg/m  Physical Exam  No acute distress, nontoxic, non-lethargic.  ED Course / MDM  EKG:    I have reviewed the labs performed to date as well as medications administered while in observation.  Awaiting TTS evaluation and recommendation p Plan  Current plan is for continued observation while waiting for TTS recommendations.  [id number]: TTS recommends inpatient treatment. Patient is not under full IVC at this time.   Etter Sjogren, PA-C 06/23/19 1628    Elnora Morrison, MD 06/27/19 0830

## 2019-06-23 NOTE — Tx Team (Signed)
Initial Treatment Plan 06/23/2019 6:55 PM [name] [id number]    PATIENT STRESSORS: Marital or family conflict Medication change or noncompliance Other: no job   PATIENT STRENGTHS: Ability for insight Average or above average intelligence Communication skills Supportive family/friends   PATIENT IDENTIFIED PROBLEMS: Overwhelmed  06/23/2019   Non compliant  With medication  06/23/2019  Suicidal No Plan 06/23/2019                 DISCHARGE CRITERIA:  Ability to meet basic life and health needs Improved stabilization in mood, thinking, and/or behavior  PRELIMINARY DISCHARGE PLAN: Outpatient therapy Return to previous living arrangement  PATIENT/FAMILY INVOLVEMENT: This treatment plan has been presented to and reviewed with the patient, Julia [name], and/or family member,  The patient and family have been given the opportunity to ask questions and make suggestions.  Leodis Liverpool, RN 06/23/2019, 6:55 PM

## 2019-06-23 NOTE — BH Assessment (Signed)
Tele Assessment Note   Patient Name: Julia [name] MRN: [id number] Referring Physician: Frederich Cha, MD Location of Patient: [facility] Location of Provider: [place] is a 18 y.o. female who presented to [facility] (transported by friend) in state of anxiety and with apparent delusion.  Pt reported that she is homeless and lives in the [place] area.  She is unemployed and not followed by an outpatient psychiatrist.  Pt was last assessed by TTS on 05/14/2019.  At that time, Pt presented to the ED with complaint of suicidal ideation, anxiety, and substance use (history of meth use, xanax, heroin, cannabis, heron).  Pt was agitated, tearful, and fearful during assessment.  When asked why she was at the hospital, Pt said that if a friend brought her in because she was ''freaking out.''  Pt was visibly agitated, crying, talking to self about calming down.  Pt stated that she is afraid she has cancer and that she can feel the cancer throughout her body.  Pt denied suicidal ideation, homicidal ideation, hallucination, and substance use (UDS and BAC were clear).  Pt was slow in response at times, and it appeared to author that she experienced thought blocking.  Pt was alternately calm and agitated and tearful. She spoke to herself, stated, ''Stop freaking out!'' and ''It's not my fault'' repeatedly.  When asked how we could help, Pt said she wanted to be checked for cancer.  During assessment, Pt presented as alert, crying, and oriented to time, place, and name.  Pt was dressed in scrubs, and she appeared appropriately groomed.  Pt's mood and affect were terrified and labile.  Pt's speech was normal in rate, rhythm, and volume.  Thought processes were within normal range, and thought content was circumstantial.  Pt denied hallucination.  There is evidence for somatic delusion (cancer).  Pt's memory and concentration were poor.  Insight and judgment were poor.  Impulse control was  fair.  Consulted with L. Marcello Moores, [place], who determined that Pt meets inpt criteria.  Diagnosis: F33.3 MDD, Recurrent Episode, w/psychotic features  Past Medical History:  Past Medical History:  Diagnosis Date  . Burning with urination 05/03/2015  . Contraceptive management 05/03/2015  . Depression   . Dysmenorrhea 12/30/2013  . Heroin addiction ([place])   . Menorrhagia 12/30/2013  . Menstrual extraction 12/30/2013  . Migraines   . Social anxiety disorder 09/25/2016  . Vaginal odor 05/03/2015    Past Surgical History:  Procedure Laterality Date  . NO PAST SURGERIES      Family History:  Family History  Problem Relation Age of Onset  . Depression Mother   . Hypertension Father   . Hyperlipidemia Father   . Cancer Paternal Grandmother        breast, uterine  . Cirrhosis Paternal Grandfather        due to alcohol    Social History:  reports that she has been smoking cigarettes. She has been smoking about 0.25 packs per day. She has never used smokeless tobacco. She reports current alcohol use. She reports previous drug use. Drugs: Marijuana, Cocaine, and Oxycodone.  Additional Social History:  Alcohol / Drug Use Pain Medications: See MAR Prescriptions: See MAR Over the Counter: See MAR History of alcohol / drug use?: Yes(Pt has history of opioid abuse; UDS was negative.)  CIWA: CIWA-Ar BP: 125/85 Pulse Rate: 91 COWS:    Allergies:  Allergies  Allergen Reactions  . Abilify [Aripiprazole]   . Zoloft [Sertraline Hcl]  Home Medications: (Not in a hospital admission)   OB/GYN Status:  No LMP recorded. Patient has had an implant.  General Assessment Data Location of Assessment: [facility] TTS Assessment: In system Is this a Tele or Face-to-Face Assessment?: Tele Assessment Is this an Initial Assessment or a Re-assessment for this encounter?: Initial Assessment Patient Accompanied by:: N/A Language Other than English: No Living Arrangements: Homeless/Shelter What  gender do you identify as?: Female Marital status: Single Maiden name: Moyeda Pregnancy Status: No Living Arrangements: Other (Comment)(Homeless) Can pt return to current living arrangement?: Yes Admission Status: Voluntary Is patient capable of signing voluntary admission?: Yes Referral Source: Self/Family/Friend Insurance type: None     Crisis Care Plan Living Arrangements: Other (Comment)(Homeless) Name of Psychiatrist: None Name of Therapist: None  Education Status Is patient currently in school?: No Is the patient employed, unemployed or receiving disability?: Unemployed  Risk to self with the past 6 months Suicidal Ideation: No-Not Currently/Within Last 6 Months Has patient been a risk to self within the past 6 months prior to admission? : Yes Suicidal Intent: No Has patient had any suicidal intent within the past 6 months prior to admission? : Yes Is patient at risk for suicide?: No Suicidal Plan?: No Has patient had any suicidal plan within the past 6 months prior to admission? : Yes Access to Means: No What has been your use of drugs/alcohol within the last 12 months?: None currently; history of substance use Previous Attempts/Gestures: Yes How many times?: 1 Other Self Harm Risks: None Triggers for Past Attempts: Unknown Intentional Self Injurious Behavior: None Family Suicide History: Unknown Recent stressful life event(s): Financial Problems, Other (Comment)(Homeless) Persecutory voices/beliefs?: No Depression: Yes Depression Symptoms: Despondent, Insomnia, Isolating, Guilt, Tearfulness, Feeling worthless/self pity Substance abuse history and/or treatment for substance abuse?: Yes Suicide prevention information given to non-admitted patients: Not applicable  Risk to Others within the past 6 months Homicidal Ideation: No Does patient have any lifetime risk of violence toward others beyond the six months prior to admission? : No Thoughts of Harm to Others:  No Current Homicidal Intent: No Current Homicidal Plan: No Access to Homicidal Means: No History of harm to others?: No Assessment of Violence: None Noted Violent Behavior Description: None indicated Does patient have access to weapons?: No Criminal Charges Pending?: Yes Describe Pending Criminal Charges: Poss Opn container; underage drinking Does patient have a court date: Yes Court Date: 07/28/19 Is patient on probation?: Unknown  Psychosis Hallucinations: (Pt denied) Delusions: Nurse, children's)  Mental Status Report Appearance/Hygiene: In scrubs, Unremarkable Eye Contact: Fair Motor Activity: Freedom of movement, Agitation Speech: Logical/coherent Level of Consciousness: Crying, Alert Mood: Terrified Affect: Labile, Frightened Anxiety Level: Severe Thought Processes: Thought Blocking(Some thought blocking apparent) Judgement: Partial Orientation: Person, Place, Time, Appropriate for developmental age Obsessive Compulsive Thoughts/Behaviors: None  Cognitive Functioning Concentration: Poor Memory: Recent Impaired, Remote Impaired Is patient IDD: No Insight: Poor Impulse Control: Fair Appetite: Fair Have you had any weight changes? : No Change Sleep: Decreased Total Hours of Sleep: (Not sure) Vegetative Symptoms: None  ADLScreening [facility] Assessment Services) Patient's cognitive ability adequate to safely complete daily activities?: Yes Patient able to express need for assistance with ADLs?: Yes Independently performs ADLs?: Yes (appropriate for developmental age)  Prior Inpatient Therapy Prior Inpatient Therapy: No Prior Therapy Dates: 02/2019, multiple admits Prior Therapy Facilty/Provider(s): [facility], other facilities Reason for Treatment: DMDD  Prior Outpatient Therapy Prior Outpatient Therapy: Yes Prior Therapy Facilty/Provider(s): [facility] Reason for Treatment: DMDD Does patient have  an ACCT team?: No Does patient have Intensive  In-House Services?  : No Does patient have [facility] services? : No Does patient have P4CC services?: No  ADL Screening (condition at time of admission) Patient's cognitive ability adequate to safely complete daily activities?: Yes Is the patient deaf or have difficulty hearing?: No Does the patient have difficulty seeing, even when wearing glasses/contacts?: No Does the patient have difficulty concentrating, remembering, or making decisions?: No Patient able to express need for assistance with ADLs?: Yes Does the patient have difficulty dressing or bathing?: No Independently performs ADLs?: Yes (appropriate for developmental age) Does the patient have difficulty walking or climbing stairs?: No Weakness of Legs: None Weakness of Arms/Hands: None  Home Assistive Devices/Equipment Home Assistive Devices/Equipment: None  Therapy Consults (therapy consults require a physician order) PT Evaluation Needed: No OT Evalulation Needed: No SLP Evaluation Needed: No Abuse/Neglect Assessment (Assessment to be complete while patient is alone) Abuse/Neglect Assessment Can Be Completed: Unable to assess, patient is non-responsive or altered mental status Values / Beliefs Cultural Requests During Hospitalization: None Spiritual Requests During Hospitalization: None Consults Spiritual Care Consult Needed: No Social Work Consult Needed: No Regulatory affairs officer (For Healthcare) Does Patient Have a Medical Advance Directive?: No Would patient like information on creating a medical advance directive?: No - Patient declined          Disposition:  Disposition Initial Assessment Completed for this Encounter: Yes Disposition of Patient: Admit Type of inpatient treatment program: Adult(Per L. Marcello Moores, NP, Pt meets inpt criteria.)  This service was provided via telemedicine using a 2-way, interactive audio and video technology.  Names of all persons participating in this telemedicine service and their  role in this encounter. Name: Julia [name] Role: Patient             Marlowe Aschoff 06/23/2019 8:45 AM

## 2019-06-23 NOTE — ED Provider Notes (Signed)
[facility] EMERGENCY DEPARTMENT Provider Note   CSN: [id number] Arrival date & time: 06/23/19  0547     History   Chief Complaint Chief Complaint  Patient presents with  . Medical Clearance    HPI Julia [name] is a 18 y.o. female.     Patient presents to the emergency department for psychiatric evaluation.  Patient reports that she feels like she is delusional.  She presented to triage in tears, having difficulty focusing.  She is able to answer questions but is slow to respond.  Patient denies thoughts of harming herself or others.     Past Medical History:  Diagnosis Date  . Burning with urination 05/03/2015  . Contraceptive management 05/03/2015  . Depression   . Dysmenorrhea 12/30/2013  . Heroin addiction ([place])   . Menorrhagia 12/30/2013  . Menstrual extraction 12/30/2013  . Migraines   . Social anxiety disorder 09/25/2016  . Vaginal odor 05/03/2015    Patient Active Problem List   Diagnosis Date Noted  . Mood disorder with psychosis ([place]) 05/16/2019  . Cocaine dependence with cocaine-induced psychotic disorder with complication ([place])   . Psychosis ([place]) 05/15/2019  . MDD (major depressive disorder), recurrent, severe, with psychosis ([place]) 02/16/2019  . Bipolar 1 disorder, depressed, severe ([place]) 01/06/2018  . Polysubstance dependence including opioid type drug, continuous use ([place]) 01/06/2018  . Petechiae 10/09/2017  . Social anxiety disorder 09/25/2016  . MDD (major depressive disorder), recurrent episode ([place]) 09/24/2016  . Substance induced mood disorder ([place]) 04/29/2016  . Depressed mood 04/17/2016  . Polysubstance abuse ([place])   . Recurrent UTI (urinary tract infection) postcoital 08/21/2015  . Dysmenorrhea 12/30/2013    Past Surgical History:  Procedure Laterality Date  . NO PAST SURGERIES       OB History    Gravida  0   Para      Term      Preterm      AB      Living        SAB      TAB      Ectopic      Multiple      Live  Births               Home Medications    Prior to Admission medications   Medication Sig Start Date End Date Taking? Authorizing Provider  etonogestrel (NEXPLANON) 68 MG IMPL implant 1 each (68 mg total) by Subdermal route once for 1 dose. (Birth control method) 01/08/18 05/15/19  Lindell Spar I, NP  FLUoxetine (PROZAC) 20 MG capsule Take 1 capsule (20 mg total) by mouth daily. 05/20/19   [name], MD  hydrOXYzine (ATARAX/VISTARIL) 25 MG tablet Take 1 tablet (25 mg total) by mouth 3 (three) times daily as needed for anxiety. Patient not taking: Reported on 05/15/2019 02/19/19   Connye Burkitt, NP  ibuprofen (ADVIL) 600 MG tablet Take 1 tablet (600 mg total) by mouth every 6 (six) hours as needed. Patient not taking: Reported on 05/15/2019 03/02/19   Jacqlyn Larsen, PA-C  lamoTRIgine (LAMICTAL) 25 MG tablet Take 2 tablets (50 mg total) by mouth 2 (two) times daily. 05/21/19   [name], MD  nicotine polacrilex (NICORETTE) 2 MG gum Take 1 each (2 mg total) by mouth as needed for smoking cessation. Patient not taking: Reported on 05/15/2019 02/19/19   Connye Burkitt, NP  QUEtiapine (SEROQUEL) 300 MG tablet Take 1 tablet (300 mg total) by mouth at bedtime. 05/20/19  [name], MD    Family History Family History  Problem Relation Age of Onset  . Depression Mother   . Hypertension Father   . Hyperlipidemia Father   . Cancer Paternal Grandmother        breast, uterine  . Cirrhosis Paternal Grandfather        due to alcohol    Social History Social History   Tobacco Use  . Smoking status: Current Every Day Smoker    Packs/day: 0.25    Types: Cigarettes  . Smokeless tobacco: Never Used  . Tobacco comment: parents smoke   Substance Use Topics  . Alcohol use: Yes  . Drug use: Not Currently    Types: Marijuana, Cocaine, Oxycodone    Comment: opitates, xanax, canned air     Allergies   Abilify [aripiprazole] and Zoloft [sertraline hcl]   Review of Systems Review of  Systems  Psychiatric/Behavioral: Positive for dysphoric mood. Negative for suicidal ideas. The patient is nervous/anxious.   All other systems reviewed and are negative.    Physical Exam Updated Vital Signs BP 125/85   Pulse 91   Temp 98.1 F (36.7 C)   Resp (!) 22   Wt 62 kg   SpO2 100%   BMI 24.21 kg/m   Physical Exam Vitals signs and nursing note reviewed.  Constitutional:      General: She is not in acute distress.    Appearance: Normal appearance. She is well-developed.  HENT:     Head: Normocephalic and atraumatic.     Right Ear: Hearing normal.     Left Ear: Hearing normal.     Nose: Nose normal.  Eyes:     Conjunctiva/sclera: Conjunctivae normal.     Pupils: Pupils are equal, round, and reactive to light.  Neck:     Musculoskeletal: Normal range of motion and neck supple.  Cardiovascular:     Rate and Rhythm: Regular rhythm.     Heart sounds: S1 normal and S2 normal. No murmur. No friction rub. No gallop.   Pulmonary:     Effort: Pulmonary effort is normal. No respiratory distress.     Breath sounds: Normal breath sounds.  Chest:     Chest wall: No tenderness.  Abdominal:     General: Bowel sounds are normal.     Palpations: Abdomen is soft.     Tenderness: There is no abdominal tenderness. There is no guarding or rebound. Negative signs include Murphy's sign and McBurney's sign.     Hernia: No hernia is present.  Musculoskeletal: Normal range of motion.  Skin:    General: Skin is warm and dry.     Findings: No rash.  Neurological:     Mental Status: She is alert and oriented to person, place, and time.     GCS: GCS eye subscore is 4. GCS verbal subscore is 5. GCS motor subscore is 6.     Cranial Nerves: No cranial nerve deficit.     Sensory: No sensory deficit.     Coordination: Coordination normal.  Psychiatric:        Attention and Perception: She is inattentive.        Mood and Affect: Mood is anxious and depressed. Affect is labile and tearful.         Speech: Speech is delayed.        Behavior: Behavior is slowed and withdrawn.        Thought Content: Thought content is delusional.      ED  Treatments / Results  Labs (all labs ordered are listed, but only abnormal results are displayed) Labs Reviewed  CBC  COMPREHENSIVE METABOLIC PANEL  RAPID URINE DRUG SCREEN, HOSP PERFORMED  ETHANOL  I-STAT BETA HCG BLOOD, ED ([facility], [facility], [facility] ONLY)    EKG None  Radiology No results found.  Procedures Procedures (including critical care time)  Medications Ordered in ED Medications  OLANZapine zydis (ZYPREXA) disintegrating tablet 10 mg (has no administration in time range)    And  LORazepam (ATIVAN) tablet 1 mg (has no administration in time range)    And  ziprasidone (GEODON) injection 20 mg (has no administration in time range)  nicotine (NICODERM CQ - dosed in mg/24 hours) patch 21 mg (has no administration in time range)  lamoTRIgine (LAMICTAL) tablet 50 mg (has no administration in time range)  QUEtiapine (SEROQUEL) tablet 300 mg (has no administration in time range)  FLUoxetine (PROZAC) capsule 20 mg (has no administration in time range)     Initial Impression / Assessment and Plan / ED Course  I have reviewed the triage vital signs and the nursing notes.  Pertinent labs & imaging results that were available during my care of the patient were reviewed by me and considered in my medical decision making (see chart for details).        Patient presents with paranoia and severe anxiety and depression.  She has not homicidal or suicidal, therefore does not require IVC but feels overwhelmed and will therefore have behavioral health evaluation.  Final Clinical Impressions(s) / ED Diagnoses   Final diagnoses:  Psychosis, unspecified psychosis type [facility])    ED Discharge Orders    None       Orpah Greek, MD 06/23/19 [phone]

## 2019-06-23 NOTE — Progress Notes (Signed)
Admission  18 year old white female in under the service of Dr. Weber Cooks  Patient entered the unit yell and screaming. Patient continued this behavior  through out   Admission  Process.  Pt appeared depressed  With  a flat affect.  Pt voice of  Suicidal ideation  With no plan Patient having difficulties focusing . Patient  Reports being homeless . Patient has no job. Pt is redirectable and cooperative with assessment.   Patient aware of time  And place .    A: Pt admitted to unit per protocol, skin assessment and search done and no contraband found with [place]  [place] .  Pt  educated on therapeutic milieu rules. Pt was introduced to milieu by nursing staff.    R: Pt was receptive to education about the milieu .  15 min safety checks started. Probation officer offered support

## 2019-06-23 NOTE — Plan of Care (Signed)
Pt will remain safe on the unit. Pt will be able to verbalize her thoughts and feelings and seek help when needed. Pt will learn ways to cope with her depression and anxiety.  Problem: Coping: Goal: Ability to verbalize frustrations and anger appropriately will improve Outcome: Progressing   Problem: Safety: Goal: Periods of time without injury will increase Outcome: Progressing   Problem: Coping: Goal: Ability to interact with others will improve Outcome: Progressing   Problem: Self-Concept: Goal: Ability to identify factors that promote anxiety will improve Outcome: Progressing Goal: Level of anxiety will decrease Outcome: Progressing Goal: Ability to modify response to factors that promote anxiety will improve Outcome: Progressing

## 2019-06-23 NOTE — ED Notes (Signed)
Pt yelling she is delusional and thinks someone is out to get her

## 2019-06-23 NOTE — ED Notes (Signed)
Before pt's tts, pt was crying and lying on the floor. Pt states that it is not her fault. She states she wants to get tested for cancer.

## 2019-06-24 DIAGNOSIS — F333 Major depressive disorder, recurrent, severe with psychotic symptoms: Secondary | ICD-10-CM

## 2019-06-24 MED ORDER — CEFTRIAXONE SODIUM 250 MG IJ SOLR
250.0000 mg | Freq: Once | INTRAMUSCULAR | Status: AC
  Administered 2019-06-24: 17:00:00 250 mg via INTRAMUSCULAR
  Filled 2019-06-24 (×2): qty 250

## 2019-06-24 MED ORDER — HYDROXYZINE HCL 25 MG PO TABS
25.0000 mg | ORAL_TABLET | Freq: Four times a day (QID) | ORAL | Status: DC | PRN
  Administered 2019-06-25 – 2019-06-28 (×5): 25 mg via ORAL
  Filled 2019-06-24 (×7): qty 1

## 2019-06-24 MED ORDER — AZITHROMYCIN 1 G PO PACK
1.0000 g | PACK | Freq: Once | ORAL | Status: AC
  Administered 2019-06-24: 17:00:00 1 g via ORAL
  Filled 2019-06-24: qty 1

## 2019-06-24 NOTE — Progress Notes (Signed)
Pt awake this am. Out in the hallway by the medication room, crying, throwing a fit and yelling"make it stop, its a curse!" and other incomprehensible words. Pt's behavior is bizarre and panicked like she was afraid of whatever it was she was responding to. Pt appears to be having some form of auditory hallucinations. Pt had 2 breakfast bars in her hand which she she had requested earlier on, and threw those on the floor during this episode.  Pt is given emotional support, reassured of her safety and medicated. Pt encouraged to go back to her room, which she did slowly. Pt has exhibited a slow, catatonic walk, with a lost look on her face all shift. Q 15 minute safety checks maintained.

## 2019-06-24 NOTE — Progress Notes (Signed)
Recreation Therapy Notes   Date: 06/24/2019  Time: 9:30 am   Location: Craft room   Behavioral response: N/A   Intervention Topic: Leisure  Discussion/Intervention: Patient did not attend group.   Clinical Observations/Feedback:  Patient did not attend group.   Arneisha Kincannon LRT/CTRS        Clinten Howk 06/24/2019 11:13 AM

## 2019-06-24 NOTE — BHH Counselor (Signed)
CSW attempted to complete the patient's PSA. Patient was asleep and would not wake.  CSW will attempt again later.   [name], MSW, LCSW 06/24/2019 10:14 AM

## 2019-06-24 NOTE — B[facility] Group Notes (Signed)
LCSW Group Therapy Note  06/24/2019 2:13 PM  Type of Therapy/Topic:  Group Therapy:  Balance in Life  Participation Level:  Did Not Attend  Description of Group:    This group will address the concept of balance and how it feels and looks when one is unbalanced. Patients will be encouraged to process areas in their lives that are out of balance and identify reasons for remaining unbalanced. Facilitators will guide patients in utilizing problem-solving interventions to address and correct the stressor making their life unbalanced. Understanding and applying boundaries will be explored and addressed for obtaining and maintaining a balanced life. Patients will be encouraged to explore ways to assertively make their unbalanced needs known to significant others in their lives, using other group members and facilitator for support and feedback.  Therapeutic Goals: 1. Patient will identify two or more emotions or situations they have that consume much of in their lives. 2. Patient will identify signs/triggers that life has become out of balance:  3. Patient will identify two ways to set boundaries in order to achieve balance in their lives:  4. Patient will demonstrate ability to communicate their needs through discussion and/or role plays  Summary of Patient Progress: x     Therapeutic Modalities:   Cognitive Behavioral Therapy Solution-Focused Therapy Assertiveness Training  Evalina Field, MSW, LCSW Clinical Social Work 06/24/2019 2:13 PM

## 2019-06-24 NOTE — Progress Notes (Signed)
Recreation Therapy Notes  INPATIENT RECREATION THERAPY ASSESSMENT  Patient Details Name: QUINTANA PESHLAKAI MRN: [id number] DOB: 2001/03/16 Today's Date: 06/24/2019       Information Obtained From: Patient  Able to Participate in Assessment/Interview: Yes  Patient Presentation: Responsive  Reason for Admission (Per Patient): Active Symptoms  Patient Stressors: Other (Comment)(illuminati after me)  Coping Skills:   Deep Breathing  Leisure Interests (2+):  Art Chief of Staff, Music - Listen  Frequency of Recreation/Participation:    Awareness of Community Resources:     [employer]:     Current Use:    If no, Barriers?:    Expressed Interest in [employer] Information:    [place] of Residence:  [employer]  Patient Main Form of Transportation: Musician  Patient Strengths:  smart  Patient Identified Areas of Improvement:  my attitude  Patient Goal for Hospitalization:  Treatment for my medication  Current SI (including self-harm):  No  Current HI:  No  Current AVH: No  Staff Intervention Plan: Group Attendance, Collaborate with Interdisciplinary Treatment Team  Consent to Intern Participation: N/A  Eleftherios Dudenhoeffer 06/24/2019, 3:29 PM

## 2019-06-24 NOTE — BHH Group Notes (Signed)
[place] Group Notes:  (Nursing/MHT/Case Management/Adjunct)  Date:  06/24/2019  Time:  8:34 PM  Type of Therapy:  Group Therapy  Participation Level:  Did Not Attend  Julia [name] 06/24/2019, 8:34 PM

## 2019-06-24 NOTE — Tx Team (Signed)
Interdisciplinary Treatment and Diagnostic Plan Update  06/24/2019 Time of Session: 9:00AM Julia [name] MRN: [id number]  Principal Diagnosis: <principal problem not specified>  Secondary Diagnoses: Active Problems:   MDD (major depressive disorder), recurrent, severe, with psychosis ([place])   Current Medications:  Current Facility-Administered Medications  Medication Dose Route Frequency Provider Last Rate Last Admin  . acetaminophen (TYLENOL) tablet 650 mg  650 mg Oral Q6H PRN Money, Lowry Ram, FNP      . alum & mag hydroxide-simeth (MAALOX/MYLANTA) 200-200-20 MG/5ML suspension 30 mL  30 mL Oral Q4H PRN Money, Darnelle Maffucci B, FNP      . diphenhydrAMINE (BENADRYL) capsule 50 mg  50 mg Oral Q6H PRN Money, Lowry Ram, FNP   50 mg at 06/24/19 [id number]   Or  . diphenhydrAMINE (BENADRYL) injection 50 mg  50 mg Intramuscular Q6H PRN Money, Lowry Ram, FNP      . FLUoxetine (PROZAC) capsule 20 mg  20 mg Oral Daily Money, Lowry Ram, FNP   20 mg at 06/24/19 0800  . haloperidol (HALDOL) tablet 5 mg  5 mg Oral Q6H PRN Money, Lowry Ram, FNP       Or  . haloperidol lactate (HALDOL) injection 5 mg  5 mg Intramuscular Q6H PRN Money, Lowry Ram, FNP      . hydrOXYzine (ATARAX/VISTARIL) tablet 25 mg  25 mg Oral TID PRN Money, Lowry Ram, FNP   25 mg at 06/23/19 2131  . lamoTRIgine (LAMICTAL) tablet 50 mg  50 mg Oral BID Money, Darnelle Maffucci B, FNP   50 mg at 06/24/19 0800  . LORazepam (ATIVAN) tablet 2 mg  2 mg Oral Q6H PRN Money, Lowry Ram, FNP   2 mg at 06/24/19 [id number]   Or  . LORazepam (ATIVAN) injection 2 mg  2 mg Intramuscular Q6H PRN Money, Darnelle Maffucci B, FNP      . magnesium hydroxide (MILK OF MAGNESIA) suspension 30 mL  30 mL Oral Daily PRN Money, Darnelle Maffucci B, FNP      . nicotine (NICODERM CQ - dosed in mg/24 hours) patch 21 mg  21 mg Transdermal Daily Money, Darnelle Maffucci B, FNP   21 mg at 06/24/19 0759  . QUEtiapine (SEROQUEL) tablet 300 mg  300 mg Oral QHS Money, Lowry Ram, FNP   300 mg at 06/23/19 2131  . traZODone (DESYREL) tablet 50  mg  50 mg Oral QHS PRN Money, Lowry Ram, FNP   50 mg at 06/23/19 2131   PTA Medications: Medications Prior to Admission  Medication Sig Dispense Refill Last Dose  . etonogestrel (NEXPLANON) 68 MG IMPL implant 1 each (68 mg total) by Subdermal route once for 1 dose. (Birth control method) 1 each 0   . FLUoxetine (PROZAC) 20 MG capsule Take 1 capsule (20 mg total) by mouth daily. 30 capsule 1   . hydrOXYzine (ATARAX/VISTARIL) 25 MG tablet Take 1 tablet (25 mg total) by mouth 3 (three) times daily as needed for anxiety. (Patient not taking: Reported on 05/15/2019) 30 tablet 0   . ibuprofen (ADVIL) 600 MG tablet Take 1 tablet (600 mg total) by mouth every 6 (six) hours as needed. (Patient not taking: Reported on 05/15/2019) 30 tablet 0   . lamoTRIgine (LAMICTAL) 25 MG tablet Take 2 tablets (50 mg total) by mouth 2 (two) times daily. 60 tablet 0   . nicotine polacrilex (NICORETTE) 2 MG gum Take 1 each (2 mg total) by mouth as needed for smoking cessation. (Patient not taking: Reported on 05/15/2019) 100 tablet 0   .  QUEtiapine (SEROQUEL) 300 MG tablet Take 1 tablet (300 mg total) by mouth at bedtime. 30 tablet 2     Patient Stressors: Marital or family conflict Medication change or noncompliance Other: no job  Patient Strengths: Ability for insight Average or above average intelligence Communication skills Supportive family/friends  Treatment Modalities: Medication Management, Group therapy, Case management,  1 to 1 session with clinician, Psychoeducation, Recreational therapy.   Physician Treatment Plan for Primary Diagnosis: <principal problem not specified> Long Term Goal(s):     Short Term Goals:    Medication Management: Evaluate patient's response, side effects, and tolerance of medication regimen.  Therapeutic Interventions: 1 to 1 sessions, Unit Group sessions and Medication administration.  Evaluation of Outcomes: Not Progressing  Physician Treatment Plan for Secondary  Diagnosis: Active Problems:   MDD (major depressive disorder), recurrent, severe, with psychosis ([place])  Long Term Goal(s):     Short Term Goals:       Medication Management: Evaluate patient's response, side effects, and tolerance of medication regimen.  Therapeutic Interventions: 1 to 1 sessions, Unit Group sessions and Medication administration.  Evaluation of Outcomes: Not Progressing   RN Treatment Plan for Primary Diagnosis: <principal problem not specified> Long Term Goal(s): Knowledge of disease and therapeutic regimen to maintain health will improve  Short Term Goals: Ability to demonstrate self-control, Ability to participate in decision making will improve, Ability to verbalize feelings will improve, Ability to disclose and discuss suicidal ideas and Ability to identify and develop effective coping behaviors will improve  Medication Management: RN will administer medications as ordered by provider, will assess and evaluate patient's response and provide education to patient for prescribed medication. RN will report any adverse and/or side effects to prescribing provider.  Therapeutic Interventions: 1 on 1 counseling sessions, Psychoeducation, Medication administration, Evaluate responses to treatment, Monitor vital signs and CBGs as ordered, Perform/monitor CIWA, COWS, AIMS and Fall Risk screenings as ordered, Perform wound care treatments as ordered.  Evaluation of Outcomes: Not Progressing   LCSW Treatment Plan for Primary Diagnosis: <principal problem not specified> Long Term Goal(s): Safe transition to appropriate next level of care at discharge, Engage patient in therapeutic group addressing interpersonal concerns.  Short Term Goals: Engage patient in aftercare planning with referrals and resources, Increase social support, Increase ability to appropriately verbalize feelings, Increase emotional regulation, Facilitate acceptance of mental health diagnosis and concerns and  Facilitate patient progression through stages of change regarding substance use diagnoses and concerns  Therapeutic Interventions: Assess for all discharge needs, 1 to 1 time with Social worker, Explore available resources and support systems, Assess for adequacy in community support network, Educate family and significant other(s) on suicide prevention, Complete Psychosocial Assessment, Interpersonal group therapy.  Evaluation of Outcomes: Not Progressing   Progress in Treatment: Attending groups: No. Participating in groups: No. Taking medication as prescribed: Yes. Toleration medication: Yes. Family/Significant other contact made: No, will contact:  once permission is given. Patient understands diagnosis: No. Discussing patient identified problems/goals with staff: Yes. Medical problems stabilized or resolved: Yes. Denies suicidal/homicidal ideation: Yes. Issues/concerns per patient self-inventory: No. Other: none  New problem(s) identified: No, Describe:  none  New Short Term/Long Term Goal(s): elimination of symptoms of psychosis, medication management for mood stabilization; elimination of SI thoughts; development of comprehensive mental wellness/sobriety plan.  Patient Goals:  "treatment, get on medicine"  Discharge Plan or Barriers: Patient reports she is homeless and will ned assistance identifying alternative living arrangements. Aftercare plans need to be assessed.   Reason for Continuation  of Hospitalization: Anxiety Delusions  Depression Hallucinations Medication stabilization  Estimated Length of Stay:   1-7 days  Attendees: Patient: Julia [name] 06/24/2019 10:14 AM  Physician: Dr. Weber Cooks, MD 06/24/2019 10:14 AM  Nursing: Polly Cobia, RN 06/24/2019 10:14 AM  RN Care Manager: 06/24/2019 10:14 AM  Social Worker: [name], LCSW 06/24/2019 10:14 AM  Recreational Therapist: Roanna Epley, Reather Converse, LRT 06/24/2019 10:14 AM  Other: Evalina Field, LCSW 06/24/2019  10:14 AM  Other:  06/24/2019 10:14 AM  Other: 06/24/2019 10:14 AM    Scribe for Treatment Team: Rozann Lesches, LCSW 06/24/2019 10:14 AM

## 2019-06-24 NOTE — [facility] Suicide Risk Assessment (Signed)
[facility] Admission Suicide Risk Assessment   Nursing information obtained from:  Patient Demographic factors:  Caucasian Current Mental Status:  Suicidal ideation indicated by patient Loss Factors:  Financial problems / change in socioeconomic status Historical Factors:  NA Risk Reduction Factors:  NA  Total Time spent with patient: 1 hour Principal Problem: MDD (major depressive disorder), recurrent, severe, with psychosis ([place]) Diagnosis:  Principal Problem:   MDD (major depressive disorder), recurrent, severe, with psychosis ([place]) Active Problems:   Recurrent UTI (urinary tract infection) postcoital   Polysubstance abuse ([place])  Subjective Data: Patient seen and chart reviewed.  18 year old woman brought to the hospital under IVC because of depression and psychotic symptoms.  On interview today the patient is redirectable but remains somewhat confused and disorganized in her thoughts.  Endorses being anxious and feeling "freaked out" a lot of the time.  Admits to auditory hallucinations.  Has passive suicidal thoughts without any intent.  She denies any recent drug abuse.  Clearly seems at loose ends and hopeless with no real idea how to take care of herself.  Continued Clinical Symptoms:  Alcohol Use Disorder Identification Test Final Score (AUDIT): 0 The "Alcohol Use Disorders Identification Test", Guidelines for Use in Primary Care, Second Edition.  World Pharmacologist [facility]). Score between 0-7:  no or low risk or alcohol related problems. Score between 8-15:  moderate risk of alcohol related problems. Score between 16-19:  high risk of alcohol related problems. Score 20 or above:  warrants further diagnostic evaluation for alcohol dependence and treatment.   CLINICAL FACTORS:   Severe Anxiety and/or Agitation Depression:   Hopelessness Impulsivity Insomnia   Musculoskeletal: Strength & Muscle Tone: within normal limits Gait & Station: normal Patient leans:  N/A  Psychiatric Specialty Exam: Physical Exam  Nursing note and vitals reviewed. Constitutional: She appears well-developed and well-nourished.  HENT:  Head: Normocephalic and atraumatic.  Eyes: Pupils are equal, round, and reactive to light. Conjunctivae are normal.  Cardiovascular: Regular rhythm and normal heart sounds.  Respiratory: Effort normal.  GI: Soft.  Musculoskeletal:        General: Normal range of motion.     Cervical back: Normal range of motion.  Neurological: She is alert.  Skin: Skin is warm and dry.  Psychiatric: Her mood appears anxious. Her affect is labile. Her speech is delayed. She is agitated and withdrawn. She is not aggressive and not hyperactive. Thought content is paranoid. Cognition and memory are impaired. She expresses impulsivity and inappropriate judgment. She exhibits a depressed mood. She expresses suicidal ideation. She expresses no suicidal plans.    Review of Systems  Constitutional: Negative.   HENT: Negative.   Eyes: Negative.   Respiratory: Negative.   Cardiovascular: Negative.   Gastrointestinal: Negative.   Musculoskeletal: Negative.   Skin: Negative.   Neurological: Negative.   Psychiatric/Behavioral: Positive for agitation, behavioral problems, confusion, decreased concentration, dysphoric mood, hallucinations, sleep disturbance and suicidal ideas. The patient is nervous/anxious and is hyperactive.     Blood pressure 102/69, pulse (!) 101, temperature 97.6 F (36.4 C), temperature source Oral, resp. rate 17, height 5\' 3"  (1.6 m), weight 65.8 kg, SpO2 99 %.Body mass index is 25.69 kg/m.  General Appearance: Casual  Eye Contact:  Fair  Speech:  Slow  Volume:  Decreased  Mood:  Anxious, Depressed, Dysphoric and Hopeless  Affect:  Congruent and Labile  Thought Process:  Disorganized  Orientation:  Full (Time, Place, and Person)  Thought Content:  Illogical, Hallucinations: Auditory, Paranoid Ideation, Rumination  and Tangential   Suicidal Thoughts:  Yes.  without intent/plan  Homicidal Thoughts:  No  Memory:  Immediate;   Fair Recent;   Poor Remote;   Poor  Judgement:  Impaired  Insight:  Shallow  Psychomotor Activity:  Decreased  Concentration:  Concentration: Poor  Recall:  Poor  Fund of Knowledge:  Fair  Language:  Fair  Akathisia:  No  Handed:  Right  AIMS (if indicated):     Assets:  Desire for Improvement Resilience  ADL's:  Intact  Cognition:  Impaired,  Mild and Moderate  Sleep:  Number of Hours: 8      COGNITIVE FEATURES THAT CONTRIBUTE TO RISK:  Loss of executive function and Thought constriction (tunnel vision)    SUICIDE RISK:   Mild:  Suicidal ideation of limited frequency, intensity, duration, and specificity.  There are no identifiable plans, no associated intent, mild dysphoria and related symptoms, good self-control (both objective and subjective assessment), few other risk factors, and identifiable protective factors, including available and accessible social support.  PLAN OF CARE: Continue 15-minute checks.  Engage in individual and group therapy.  Discussed medication management with her.  I am going to simplify her medicine by focusing on fluoxetine and Seroquel.  Encourage patient to be up out of bed and interacting with others and attending groups.  When possible we will complete a social evaluation and see if we can get collateral information as well.  I certify that inpatient services furnished can reasonably be expected to improve the patient's condition.   Alethia Berthold, MD 06/24/2019, 1:33 PM
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
## 2019-06-24 NOTE — Plan of Care (Signed)
Patient aware of information received from staff concerning Education. Patient able to verbalize understanding. No periods of anger outburst this shift . Patient  voice of no safety concerns  . Limited interaction  with  staff and peers . Patient working on  Environmental health practitioner , coping  and anxiety issues . Problem: Self-Concept: Goal: Ability to identify factors that promote anxiety will improve Outcome: Progressing Goal: Level of anxiety will decrease Outcome: Progressing Goal: Ability to modify response to factors that promote anxiety will improve Outcome: Progressing   Problem: Coping: Goal: Ability to interact with others will improve Outcome: Progressing Goal: Demonstration of participation in decision-making regarding own care will improve Outcome: Progressing   Problem: Safety: Goal: Periods of time without injury will increase Outcome: Progressing   Problem: Coping: Goal: Ability to verbalize frustrations and anger appropriately will improve Outcome: Progressing   Problem: Education: Goal: Knowledge of [place] [facility] Education information/materials will improve Outcome: Progressing

## 2019-06-24 NOTE — Progress Notes (Signed)
D: Patient stayed closed to bed this shift , Limited interaction  With peers and staff .Patient aware of information received from staff concerning Education. Patient able to verbalize understanding. No periods of anger outburst this shift . Patient  voice of no safety concerns  . Limited interaction  with  staff and peers . Patient working on  Environmental health practitioner , coping  and anxiety issues . Patient stated slept poor last night .Stated appetite is good and energy level low. Stated concentration poor . Stated on Depression scale 10 , hopeless 10 and anxiety 7 .( low 0-10 high) Denies suicidal  homicidal ideations  .  No auditory hallucinations  No pain concerns . No Appropriate ADL'S.  Patient  Voice her goal  Today  " Coping  With the second coming of Jesus Christ" with unit programming . Instruction  Given on  Medication , verbalize understanding. R: Voice no other concerns. Staff continue to monitor

## 2019-06-25 LAB — CBC
HCT: 37.6 % (ref 36.0–46.0)
Hemoglobin: 12.3 g/dL (ref 12.0–15.0)
MCH: 29.2 pg (ref 26.0–34.0)
MCHC: 32.7 g/dL (ref 30.0–36.0)
MCV: 89.3 fL (ref 80.0–100.0)
Platelets: 272 10*3/uL (ref 150–400)
RBC: 4.21 MIL/uL (ref 3.87–5.11)
RDW: 12.1 % (ref 11.5–15.5)
WBC: 7.5 10*3/uL (ref 4.0–10.5)
nRBC: 0 % (ref 0.0–0.2)

## 2019-06-25 NOTE — Progress Notes (Signed)
[facility] MD Progress Note  06/25/2019 4:31 PM Julia [name]  MRN:  [id number] Subjective: Follow-up for this young woman with probably psychotic depression versus schizoaffective disorder.  Patient has mostly stayed to herself today.  Stays in her room and has not participated much.  When I came in to see her she said that she was feeling hot and feeling like she was sick.  Vital signs all look normal and she did not have any focal complaint.  Patient says she is still having auditory hallucinations mood still feels anxious frightened and depressed.  Denies any current suicidal intent or plan.  She has been compliant with medicine. Principal Problem: MDD (major depressive disorder), recurrent, severe, with psychosis ([place]) Diagnosis: Principal Problem:   MDD (major depressive disorder), recurrent, severe, with psychosis ([place]) Active Problems:   Recurrent UTI (urinary tract infection) postcoital   Polysubstance abuse ([place])  Total Time spent with patient: 30 minutes  Past Psychiatric History: Patient has a history of substance abuse and recurrent presentations with psychotic symptoms  Past Medical History:  Past Medical History:  Diagnosis Date  . Burning with urination 05/03/2015  . Contraceptive management 05/03/2015  . Depression   . Dysmenorrhea 12/30/2013  . Heroin addiction ([place])   . Menorrhagia 12/30/2013  . Menstrual extraction 12/30/2013  . Migraines   . Social anxiety disorder 09/25/2016  . Vaginal odor 05/03/2015    Past Surgical History:  Procedure Laterality Date  . NO PAST SURGERIES     Family History:  Family History  Problem Relation Age of Onset  . Depression Mother   . Hypertension Father   . Hyperlipidemia Father   . Cancer Paternal Grandmother        breast, uterine  . Cirrhosis Paternal Grandfather        due to alcohol   Family Psychiatric  History: See previous Social History:  Social History   Substance and Sexual Activity  Alcohol Use Yes   Comment: BAC  was clear     Social History   Substance and Sexual Activity  Drug Use Not Currently  . Types: Marijuana, Cocaine, Oxycodone   Comment: opitates, xanax, canned air -- UDS was negative    Social History   Socioeconomic History  . Marital status: Single    Spouse name: Not on file  . Number of children: Not on file  . Years of education: Not on file  . Highest education level: Not on file  Occupational History  . Occupation: Unemployed  Tobacco Use  . Smoking status: Current Every Day Smoker    Packs/day: 0.25    Types: Cigarettes  . Smokeless tobacco: Never Used  . Tobacco comment: parents smoke   Substance and Sexual Activity  . Alcohol use: Yes    Comment: BAC was clear  . Drug use: Not Currently    Types: Marijuana, Cocaine, Oxycodone    Comment: opitates, xanax, canned air -- UDS was negative  . Sexual activity: Yes    Birth control/protection: Implant  Other Topics Concern  . Not on file  Social History Narrative   06/23/2019:  Pt stated that she is homeless, that she is a high school graduate, and that she is unemployed and not followed by any outpatient provider.      Lives with Dad. Mom has LGD, lives in [place]. 11th grader. Dog.   Social Determinants of Health   Financial Resource Strain:   . Difficulty of Paying Living Expenses: Not on file  Food Insecurity:   .  Worried About Charity fundraiser in the Last Year: Not on file  . Ran Out of Food in the Last Year: Not on file  Transportation Needs:   . Lack of Transportation (Medical): Not on file  . Lack of Transportation (Non-Medical): Not on file  Physical Activity:   . Days of Exercise per Week: Not on file  . Minutes of Exercise per Session: Not on file  Stress:   . Feeling of Stress : Not on file  Social Connections:   . Frequency of Communication with Friends and Family: Not on file  . Frequency of Social Gatherings with Friends and Family: Not on file  . Attends Religious Services: Not on file  .  Active Member of Clubs or Organizations: Not on file  . Attends Archivist Meetings: Not on file  . Marital Status: Not on file   Additional Social History:                         Sleep: Fair  Appetite:  Fair  Current Medications: Current Facility-Administered Medications  Medication Dose Route Frequency Provider Last Rate Last Admin  . acetaminophen (TYLENOL) tablet 650 mg  650 mg Oral Q6H PRN Money, Lowry Ram, FNP   650 mg at 06/25/19 1008  . alum & mag hydroxide-simeth (MAALOX/MYLANTA) 200-200-20 MG/5ML suspension 30 mL  30 mL Oral Q4H PRN Money, Darnelle Maffucci B, FNP      . diphenhydrAMINE (BENADRYL) capsule 50 mg  50 mg Oral Q6H PRN Money, Darnelle Maffucci B, FNP   50 mg at 06/25/19 1008   Or  . diphenhydrAMINE (BENADRYL) injection 50 mg  50 mg Intramuscular Q6H PRN Money, Lowry Ram, FNP      . FLUoxetine (PROZAC) capsule 20 mg  20 mg Oral Daily Money, Lowry Ram, FNP   20 mg at 06/25/19 0810  . haloperidol (HALDOL) tablet 5 mg  5 mg Oral Q6H PRN Money, Lowry Ram, FNP   5 mg at 06/25/19 [id number]   Or  . haloperidol lactate (HALDOL) injection 5 mg  5 mg Intramuscular Q6H PRN Money, Lowry Ram, FNP      . hydrOXYzine (ATARAX/VISTARIL) tablet 25 mg  25 mg Oral Q6H PRN Latera Mclin T, MD      . LORazepam (ATIVAN) tablet 2 mg  2 mg Oral Q6H PRN Money, Lowry Ram, FNP   2 mg at 06/24/19 2102   Or  . LORazepam (ATIVAN) injection 2 mg  2 mg Intramuscular Q6H PRN Money, Lowry Ram, FNP   2 mg at 06/25/19 1229  . magnesium hydroxide (MILK OF MAGNESIA) suspension 30 mL  30 mL Oral Daily PRN Money, Darnelle Maffucci B, FNP   30 mL at 06/25/19 0934  . nicotine (NICODERM CQ - dosed in mg/24 hours) patch 21 mg  21 mg Transdermal Daily Money, Lowry Ram, FNP   21 mg at 06/25/19 0811  . QUEtiapine (SEROQUEL) tablet 300 mg  300 mg Oral QHS Money, Travis B, FNP   300 mg at 06/24/19 2100  . traZODone (DESYREL) tablet 50 mg  50 mg Oral QHS PRN Money, Lowry Ram, FNP   50 mg at 06/24/19 2100    Lab Results: No results found  for this or any previous visit (from the past 48 hour(s)).  Blood Alcohol level:  Lab Results  Component Value Date   [facility] <10 06/23/2019   ETH <10 [id number]    Metabolic Disorder Labs: Lab Results  Component Value Date  HGBA1C 5.2 05/17/2019   MPG 102.54 05/17/2019   MPG 108.28 02/17/2019   Lab Results  Component Value Date   PROLACTIN 31.9 (H) 02/17/2019   PROLACTIN 29.6 (H) 09/25/2016   Lab Results  Component Value Date   CHOL 144 05/17/2019   TRIG 65 05/17/2019   HDL 35 (L) 05/17/2019   CHOLHDL 4.1 05/17/2019   VLDL 13 05/17/2019   LDLCALC 96 05/17/2019   LDLCALC 76 02/17/2019    Physical Findings: AIMS: Facial and Oral Movements Muscles of Facial Expression: None, normal Lips and Perioral Area: None, normal Jaw: None, normal Tongue: None, normal,Extremity Movements Upper (arms, wrists, hands, fingers): None, normal Lower (legs, knees, ankles, toes): None, normal, Trunk Movements Neck, shoulders, hips: None, normal, Overall Severity Severity of abnormal movements (highest score from questions above): None, normal Incapacitation due to abnormal movements: None, normal Patient's awareness of abnormal movements (rate only patient's report): No Awareness, Dental Status Current problems with teeth and/or dentures?: No Does patient usually wear dentures?: No  CIWA:    COWS:     Musculoskeletal: Strength & Muscle Tone: within normal limits Gait & Station: normal Patient leans: N/A  Psychiatric Specialty Exam: Physical Exam  Nursing note and vitals reviewed. Constitutional: She appears well-developed and well-nourished.  HENT:  Head: Normocephalic and atraumatic.  Eyes: Pupils are equal, round, and reactive to light. Conjunctivae are normal.  Cardiovascular: Regular rhythm and normal heart sounds.  Respiratory: Effort normal. No respiratory distress.  GI: Soft.  Musculoskeletal:        General: Normal range of motion.     Cervical back: Normal range of  motion.  Neurological: She is alert.  Skin: Skin is warm and dry.  Psychiatric: Judgment normal. Her mood appears anxious. Her speech is delayed. She is slowed and withdrawn. Thought content is paranoid. Cognition and memory are impaired. She expresses no homicidal and no suicidal ideation.    Review of Systems  Constitutional: Positive for fatigue.  HENT: Negative.   Eyes: Negative.   Respiratory: Negative.   Cardiovascular: Negative.   Gastrointestinal: Negative.   Musculoskeletal: Negative.   Skin: Negative.   Neurological: Negative.   Psychiatric/Behavioral: Positive for dysphoric mood and hallucinations. Negative for agitation, behavioral problems, confusion, decreased concentration, self-injury, sleep disturbance and suicidal ideas. The patient is nervous/anxious. The patient is not hyperactive.     Blood pressure 101/72, pulse 92, temperature 98.3 F (36.8 C), temperature source Oral, resp. rate 17, height 5\' 3"  (1.6 m), weight 65.8 kg, SpO2 98 %.Body mass index is 25.69 kg/m.  General Appearance: Disheveled  Eye Contact:  Minimal  Speech:  Slow  Volume:  Decreased  Mood:  Anxious and Dysphoric  Affect:  Constricted  Thought Process:  Disorganized  Orientation:  Full (Time, Place, and Person)  Thought Content:  Illogical, Rumination and Tangential  Suicidal Thoughts:  No  Homicidal Thoughts:  No  Memory:  Immediate;   Fair Recent;   Fair Remote;   Fair  Judgement:  Impaired  Insight:  Shallow  Psychomotor Activity:  Decreased  Concentration:  Concentration: Fair  Recall:  [employer] of Knowledge:  Fair  Language:  Fair  Akathisia:  No  Handed:  Right  AIMS (if indicated):     Assets:  Desire for Improvement Physical Health Resilience  ADL's:  Intact  Cognition:  Impaired,  Mild  Sleep:  Number of Hours: 8.5     Treatment Plan Summary: Daily contact with patient to assess and evaluate symptoms and progress in  treatment, Medication management and Plan I  agreed to recheck a CBC given her feeling of being feverish and achy although I do not expect it is likely to show anything.  Encourage patient to be up out of bed take care of her hygiene ask for Tylenol if needed. No Change to medication today.  Alethia Berthold, MD 06/25/2019, 4:31 PM

## 2019-06-25 NOTE — Plan of Care (Signed)
Patient appears with a blunted affect.Patient gets more delusional and disorganized,states "devil is giving me stress.My whole body is inflamed.I have cancer." Patient got agitated and screaming at the hallway.Medicated with Ativan.Patient denies SI,HI and AVH.Compliant with medications.Appetite and energy level good. Support and encouragement given.

## 2019-06-25 NOTE — BHH Group Notes (Signed)
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
## 2019-06-25 NOTE — Progress Notes (Signed)
Recreation Therapy Notes    Date: 06/25/2019  Time: 9:30 am  Location: Craft room  Behavioral response: Appropriate  Intervention Topic: Relaxation    Discussion/Intervention:  Group content today was focused on relaxation. The group defined relaxation and identified healthy ways to relax. Individuals expressed how much time they spend relaxing. Patients expressed how much their life would be if they did not make time for themselves to relax. The group stated ways they could improve their relaxation techniques in the future.  Individuals participated in the intervention "Time to Relax" where they had a chance to experience different relaxation techniques.  Clinical Observations/Feedback:  Patient came to group and started cry once group began. She got up and left group stating "everyone is so mean." Individual never returned to group.  Calen Geister LRT/CTRS           Markice Torbert 06/25/2019 11:20 AM

## 2019-06-25 NOTE — BHH Suicide Risk Assessment (Signed)
[place] INPATIENT:  Family/Significant Other Suicide Prevention Education  Suicide Prevention Education:  Education Completed; Montessa Garske, father, [phone]  has been identified by the patient as the family member/significant other with whom the patient will be residing, and identified as the person(s) who will aid the patient in the event of a mental health crisis (suicidal ideations/suicide attempt).  With written consent from the patient, the family member/significant other has been provided the following suicide prevention education, prior to the and/or following the discharge of the patient.  The suicide prevention education provided includes the following:  Suicide risk factors  Suicide prevention and interventions  National Suicide Hotline telephone number  [facility] assessment telephone number  [facility] Emergency Assistance [place] and/or Residential Mobile Crisis Unit telephone number  Request made of family/significant other to:  Remove weapons (e.g., guns, rifles, knives), all items previously/currently identified as safety concern.    Remove drugs/medications (over-the-counter, prescriptions, illicit drugs), all items previously/currently identified as a safety concern.  The family member/significant other verbalizes understanding of the suicide prevention education information provided.  The family member/significant other agrees to remove the items of safety concern listed above.  Father reports that he does not think that the patient has been using drugs at this time. He reports a belief that the patient has been decompensating and her behaviors and she has been haring voices.  He reports that he had paid for the patient to have a hotel, however, the patient can not return there. Father reports that it is unlikely that the patient can stay with her family friend as pt had originally reported.   He identifies that patient has limited  financial and social supports.   Rozann Lesches 06/25/2019, 11:02 AM

## 2019-06-25 NOTE — Progress Notes (Signed)
Patient alert and oriented x 4, affect is flat and sad, she was noted restless no bizarre bizarre on unit, she denies SI/HI/AVH. She was noted interacting appropriately with peers and staff, compliant with medication regimen, emotional support and encouragement offered she was receptive, 15 minutes safety checks maintained will continue to monitor.

## 2019-06-26 DIAGNOSIS — F333 Major depressive disorder, recurrent, severe with psychotic symptoms: Principal | ICD-10-CM

## 2019-06-26 LAB — URINALYSIS, COMPLETE (UACMP) WITH MICROSCOPIC
Bacteria, UA: NONE SEEN
Bilirubin Urine: NEGATIVE
Glucose, UA: NEGATIVE mg/dL
Hgb urine dipstick: NEGATIVE
Ketones, ur: NEGATIVE mg/dL
Leukocytes,Ua: NEGATIVE
Nitrite: NEGATIVE
Protein, ur: NEGATIVE mg/dL
Specific Gravity, Urine: 1.001 — ABNORMAL LOW (ref 1.005–1.030)
WBC, UA: NONE SEEN WBC/hpf (ref 0–5)
pH: 6 (ref 5.0–8.0)

## 2019-06-26 LAB — RESPIRATORY PANEL BY RT PCR (FLU A&B, COVID)
Influenza A by PCR: NEGATIVE
Influenza B by PCR: NEGATIVE
SARS Coronavirus 2 by RT PCR: NEGATIVE

## 2019-06-26 MED ORDER — IBUPROFEN 100 MG/5ML PO SUSP
600.0000 mg | Freq: Four times a day (QID) | ORAL | Status: DC | PRN
  Filled 2019-06-26: qty 30

## 2019-06-26 MED ORDER — FLUOXETINE HCL 20 MG PO CAPS
30.0000 mg | ORAL_CAPSULE | Freq: Every day | ORAL | Status: DC
  Administered 2019-06-27 – 2019-06-29 (×3): 30 mg via ORAL
  Filled 2019-06-26 (×3): qty 1

## 2019-06-26 MED ORDER — IBUPROFEN 600 MG PO TABS
600.0000 mg | ORAL_TABLET | Freq: Four times a day (QID) | ORAL | Status: DC | PRN
  Administered 2019-06-26 – 2019-06-28 (×2): 600 mg via ORAL
  Filled 2019-06-26 (×2): qty 1

## 2019-06-26 MED ORDER — QUETIAPINE FUMARATE 25 MG PO TABS
50.0000 mg | ORAL_TABLET | Freq: Every day | ORAL | Status: DC
  Administered 2019-06-26 – 2019-06-29 (×4): 50 mg via ORAL
  Filled 2019-06-26 (×4): qty 2

## 2019-06-26 NOTE — Plan of Care (Signed)
Patient aware of information received from staff concerning Education. Patient able to verbalize understanding. No periods of anger outburst this shift . Patient  voice of no safety concerns  . Limited interaction  with  staff and peers . Patient working on  Environmental health practitioner , coping  and anxiety issues  Problem: Education: Goal: Knowledge of [facility] Education information/materials will improve Outcome: Progressing   Problem: Coping: Goal: Ability to verbalize frustrations and anger appropriately will improve Outcome: Progressing   Problem: Safety: Goal: Periods of time without injury will increase Outcome: Progressing   Problem: Coping: Goal: Ability to interact with others will improve Outcome: Progressing Goal: Demonstration of participation in decision-making regarding own care will improve Outcome: Progressing   Problem: Self-Concept: Goal: Ability to identify factors that promote anxiety will improve Outcome: Progressing Goal: Level of anxiety will decrease Outcome: Progressing Goal: Ability to modify response to factors that promote anxiety will improve Outcome: Progressing

## 2019-06-26 NOTE — Progress Notes (Signed)
D: Patient stated slept good last night .Stated appetite is good and energy level  Is normal. Stated concentration is good . Stated on Depression scale , hopeless and anxiety .( low 0-10 high) Denies suicidal  homicidal ideations  .  No auditory hallucinations  No pain concerns . Appropriate ADL'S. Interacting with peers and staff.  A: Encourage patient participation with unit programming . Instruction  Given on  Medication , verbalize understanding. Patient got into 2 different  Altercation  With staff and peers this shift calingl staff bitches  Able to redirect R: Voice no other concerns. Staff continue to monitor

## 2019-06-26 NOTE — BHH Group Notes (Signed)
Type of Therapy and Topic:  Group Therapy:  Trust and Honesty 06/26/2019 1:30pm Participation Level:  Did Not Attend   Description of Group:     In this group patients will be asked to explore the value of being honest.  Patients will be guided to discuss their thoughts, feelings, and behaviors related to honesty and trusting in others. Patients will process together how trust and honesty relate to forming relationships with peers, family members, and self. Each patient will be challenged to identify and express feelings of being vulnerable. Patients will discuss reasons why people are dishonest and identify alternative outcomes if one was truthful (to self or others). This group will be process-oriented, with patients participating in exploration of their own experiences, giving and receiving support, and processing challenge from other group members.    Therapeutic Goals:  1.  Patient will identify why honesty is important to relationships and how honesty overall affects relationships.  2.  Patient will identify a situation where they lied or were lied too and the  feelings, thought process, and behaviors surrounding the situation  3.  Patient will identify the meaning of being vulnerable, how that feels, and how that correlates to being honest with self and others.  4.  Patient will identify situations where they could have told the truth, but instead lied and explain reasons of dishonesty.    Summary of Patient Progress:  Announcement made via overhead page, patient did not attend.   Therapeutic Modalities:   Cognitive Behavioral Therapy Solution Focused Therapy Motivational Interviewing Brief Therapy

## 2019-06-26 NOTE — Plan of Care (Signed)
  Problem: Self-Concept: Goal: Ability to identify factors that promote anxiety will improve Outcome: Progressing  Patient able to identify factors that promote anxiety like  pursed lip breathing.

## 2019-06-26 NOTE — Progress Notes (Signed)
[facility] MD Progress Note  06/26/2019 11:48 AM Julia [name]  MRN:  [id number] Subjective: Patient is a 18 year old female with a history of multiple psychiatric hospitalizations who presented to the [facility] emergency department on 12/9 with psychosis, delusional thinking, and agitation.  Objective: Patient is seen and examined.  Patient is a 18 year old female familiar to [name] from a previous visit at the behavioral health hospital.  She had a recent hospitalization there.  She was hospitalized on 11/1 after being kicked out of her home by her father.  She had intrusive recurring thoughts, and reported intermittent alcohol and cocaine use.  The patient stated today that she has been "sober for a long time".  She was discharged on 05/20/2019.  Her discharge medications included fluoxetine, high hydroxyzine, Lamictal and Seroquel.  She is currently on fluoxetine, and Seroquel as well as hydroxyzine.  She remains disorganized, and fixated on "the lab test for cancer that they got on [name] at the [facility] emergency room".  Her vital signs are stable, she is afebrile.  She slept 7.5 hours last night.  Review of her laboratories from 12/9 showed an elevated ALT at 58, normal CBC and otherwise normal electrolytes.  Beta-hCG was less than 5.  Urinalysis did show rare bacteria, white blood cells.  Blood alcohol was less than 10.  Drug screen was negative.  Principal Problem: MDD (major depressive disorder), recurrent, severe, with psychosis ([place]) Diagnosis: Principal Problem:   MDD (major depressive disorder), recurrent, severe, with psychosis ([place]) Active Problems:   Recurrent UTI (urinary tract infection) postcoital   Polysubstance abuse ([place])  Total Time spent with patient: 30 minutes  Past Psychiatric History: See admission H&P  Past Medical History:  Past Medical History:  Diagnosis Date  . Burning with urination 05/03/2015  . Contraceptive management 05/03/2015  . Depression   . Dysmenorrhea  12/30/2013  . Heroin addiction ([place])   . Menorrhagia 12/30/2013  . Menstrual extraction 12/30/2013  . Migraines   . Social anxiety disorder 09/25/2016  . Vaginal odor 05/03/2015    Past Surgical History:  Procedure Laterality Date  . NO PAST SURGERIES     Family History:  Family History  Problem Relation Age of Onset  . Depression Mother   . Hypertension Father   . Hyperlipidemia Father   . Cancer Paternal Grandmother        breast, uterine  . Cirrhosis Paternal Grandfather        due to alcohol   Family Psychiatric  History: See admission H&P Social History:  Social History   Substance and Sexual Activity  Alcohol Use Yes   Comment: BAC was clear     Social History   Substance and Sexual Activity  Drug Use Not Currently  . Types: Marijuana, Cocaine, Oxycodone   Comment: opitates, xanax, canned air -- UDS was negative    Social History   Socioeconomic History  . Marital status: Single    Spouse name: Not on file  . Number of children: Not on file  . Years of education: Not on file  . Highest education level: Not on file  Occupational History  . Occupation: Unemployed  Tobacco Use  . Smoking status: Current Every Day Smoker    Packs/day: 0.25    Types: Cigarettes  . Smokeless tobacco: Never Used  . Tobacco comment: parents smoke   Substance and Sexual Activity  . Alcohol use: Yes    Comment: BAC was clear  . Drug use: Not Currently  Types: Marijuana, Cocaine, Oxycodone    Comment: opitates, xanax, canned air -- UDS was negative  . Sexual activity: Yes    Birth control/protection: Implant  Other Topics Concern  . Not on file  Social History Narrative   06/23/2019:  Pt stated that she is homeless, that she is a high school graduate, and that she is unemployed and not followed by any outpatient provider.      Lives with Dad. Mom has LGD, lives in [place]. 11th grader. Dog.   Social Determinants of Health   Financial Resource Strain:   . Difficulty of Paying  Living Expenses: Not on file  Food Insecurity:   . Worried About Charity fundraiser in the Last Year: Not on file  . Ran Out of Food in the Last Year: Not on file  Transportation Needs:   . Lack of Transportation (Medical): Not on file  . Lack of Transportation (Non-Medical): Not on file  Physical Activity:   . Days of Exercise per Week: Not on file  . Minutes of Exercise per Session: Not on file  Stress:   . Feeling of Stress : Not on file  Social Connections:   . Frequency of Communication with Friends and Family: Not on file  . Frequency of Social Gatherings with Friends and Family: Not on file  . Attends Religious Services: Not on file  . Active Member of Clubs or Organizations: Not on file  . Attends Archivist Meetings: Not on file  . Marital Status: Not on file   Additional Social History:                         Sleep: Good  Appetite:  Good  Current Medications: Current Facility-Administered Medications  Medication Dose Route Frequency Provider Last Rate Last Admin  . acetaminophen (TYLENOL) tablet 650 mg  650 mg Oral Q6H PRN Money, Lowry Ram, FNP   650 mg at 06/26/19 0810  . alum & mag hydroxide-simeth (MAALOX/MYLANTA) 200-200-20 MG/5ML suspension 30 mL  30 mL Oral Q4H PRN Money, Darnelle Maffucci B, FNP      . diphenhydrAMINE (BENADRYL) capsule 50 mg  50 mg Oral Q6H PRN Money, Darnelle Maffucci B, FNP   50 mg at 06/25/19 1008   Or  . diphenhydrAMINE (BENADRYL) injection 50 mg  50 mg Intramuscular Q6H PRN Money, Lowry Ram, FNP      . FLUoxetine (PROZAC) capsule 20 mg  20 mg Oral Daily Money, Lowry Ram, FNP   20 mg at 06/26/19 0753  . haloperidol (HALDOL) tablet 5 mg  5 mg Oral Q6H PRN Money, Lowry Ram, FNP   5 mg at 06/25/19 [id number]   Or  . haloperidol lactate (HALDOL) injection 5 mg  5 mg Intramuscular Q6H PRN Money, Lowry Ram, FNP      . hydrOXYzine (ATARAX/VISTARIL) tablet 25 mg  25 mg Oral Q6H PRN [name], [name], MD   25 mg at 06/26/19 1014  . LORazepam (ATIVAN) tablet 2  mg  2 mg Oral Q6H PRN Money, Lowry Ram, FNP   2 mg at 06/25/19 2009   Or  . LORazepam (ATIVAN) injection 2 mg  2 mg Intramuscular Q6H PRN Money, Lowry Ram, FNP   2 mg at 06/25/19 1229  . magnesium hydroxide (MILK OF MAGNESIA) suspension 30 mL  30 mL Oral Daily PRN Money, Darnelle Maffucci B, FNP   30 mL at 06/25/19 0934  . nicotine (NICODERM CQ - dosed in mg/24 hours) patch 21  mg  21 mg Transdermal Daily Money, Lowry Ram, FNP   21 mg at 06/26/19 0754  . QUEtiapine (SEROQUEL) tablet 300 mg  300 mg Oral QHS Money, Lowry Ram, FNP   300 mg at 06/25/19 2044  . traZODone (DESYREL) tablet 50 mg  50 mg Oral QHS PRN Money, Lowry Ram, FNP   50 mg at 06/25/19 2044    Lab Results:  Results for orders placed or performed during the hospital encounter of 06/23/19 (from the past 48 hour(s))  CBC     Status: None   Collection Time: 06/25/19  5:06 PM  Result Value Ref Range   WBC 7.5 4.0 - 10.5 K/uL   RBC 4.21 3.87 - 5.11 MIL/uL   Hemoglobin 12.3 12.0 - 15.0 g/dL   HCT 37.6 36.0 - 46.0 %   MCV 89.3 80.0 - 100.0 fL   MCH 29.2 26.0 - 34.0 pg   MCHC 32.7 30.0 - 36.0 g/dL   RDW 12.1 11.5 - 15.5 %   Platelets 272 150 - 400 K/uL   nRBC 0.0 0.0 - 0.2 %    Comment: Performed at [facility], [address] [place], [place] [address]    Blood Alcohol level:  Lab Results  Component Value Date   [facility] <10 06/23/2019   ETH <10 [id number]    Metabolic Disorder Labs: Lab Results  Component Value Date   HGBA1C 5.2 05/17/2019   MPG 102.54 05/17/2019   MPG 108.28 02/17/2019   Lab Results  Component Value Date   PROLACTIN 31.9 (H) 02/17/2019   PROLACTIN 29.6 (H) 09/25/2016   Lab Results  Component Value Date   CHOL 144 05/17/2019   TRIG 65 05/17/2019   HDL 35 (L) 05/17/2019   CHOLHDL 4.1 05/17/2019   VLDL 13 05/17/2019   LDLCALC 96 05/17/2019   LDLCALC 76 02/17/2019    Physical Findings: AIMS: Facial and Oral Movements Muscles of Facial Expression: None, normal Lips and Perioral Area: None,  normal Jaw: None, normal Tongue: None, normal,Extremity Movements Upper (arms, wrists, hands, fingers): None, normal Lower (legs, knees, ankles, toes): None, normal, Trunk Movements Neck, shoulders, hips: None, normal, Overall Severity Severity of abnormal movements (highest score from questions above): None, normal Incapacitation due to abnormal movements: None, normal Patient's awareness of abnormal movements (rate only patient's report): No Awareness, Dental Status Current problems with teeth and/or dentures?: No Does patient usually wear dentures?: No  CIWA:    COWS:     Musculoskeletal: Strength & Muscle Tone: within normal limits Gait & Station: normal Patient leans: N/A  Psychiatric Specialty Exam: Physical Exam  Nursing note and vitals reviewed. Constitutional: She is oriented to person, place, and time. She appears well-developed and well-nourished.  HENT:  Head: Normocephalic and atraumatic.  Respiratory: Effort normal.  Neurological: She is alert and oriented to person, place, and time.    Review of Systems  Blood pressure 100/67, pulse (!) 108, temperature 98.2 F (36.8 C), temperature source Oral, resp. rate 18, height 5\' 3"  (1.6 m), weight 65.8 kg, SpO2 97 %.Body mass index is 25.69 kg/m.  General Appearance: Disheveled  Eye Contact:  Fair  Speech:  Pressured  Volume:  Normal  Mood:  Anxious and Dysphoric  Affect:  Labile  Thought Process:  Coherent and Descriptions of Associations: Tangential  Orientation:  Full (Time, Place, and Person)  Thought Content:  Delusions, Obsessions and Rumination  Suicidal Thoughts:  No  Homicidal Thoughts:  No  Memory:  Immediate;   Fair Recent;  Fair Remote;   Fair  Judgement:  Impaired  Insight:  Lacking  Psychomotor Activity:  Increased  Concentration:  Concentration: Fair and Attention Span: Fair  Recall:  [employer] of Knowledge:  Fair  Language:  Good  Akathisia:  Negative  Handed:  Right  AIMS (if indicated):      Assets:  Desire for Improvement Resilience  ADL's:  Intact  Cognition:  WNL  Sleep:  Number of Hours: 7.5     Treatment Plan Summary: Daily contact with patient to assess and evaluate symptoms and progress in treatment, Medication management and Plan : Patient is seen and examined.  Patient is a 18 year old female with the above-stated past psychiatric history who is seen in follow-up.   Diagnosis: #1 psychosis, including differential of schizoaffective disorder versus psychotic depression, #2 history of polysubstance abuse, but with most recent negative drug screen and negative blood alcohol, #3 urinary tract infection  Patient is seen in follow-up.  She looks very similar to when I was cross covering on the [place] at behavioral health hospital.  She had been admitted and discharged on 05/20/2019.  At that time she wanted to get into a substance rehabilitation facility.  She had been previously on Lamictal, lorazepam, Risperdal.  She remains very psychotically focused.  I will add 50 mg of Seroquel during the day, and increase her fluoxetine to 30 mg p.o. daily.  Additionally with her urine I will repeat that urinalysis and treat if there still is presence of an infection.  Otherwise no other changes in her treatment at this time. 1.  Increase fluoxetine to 30 mg p.o. daily for depression and anxiety. 2.  Continue Haldol and lorazepam as needed for agitation. 3.  Increase Seroquel to 50 mg p.o. daily and 300 mg p.o. nightly for psychosis and mood stability. 4.  Continue trazodone 50 mg p.o. nightly as needed insomnia. 5.  Repeat urinalysis. 6.  Disposition planning-in progress.  Sharma Covert, MD 06/26/2019, 11:48 AM

## 2019-06-26 NOTE — Progress Notes (Signed)
Patient alert and oriented x 4, affect is blunted, thoughts are organized and coherent, she appeared anxious and restless she stated to nurse " I am nervous l need medicine" she was noted restless on unit, she denies SI/HI/AVH. Patient ws medicated for anxiety, she was noted isolated to her room she appeared less anxious. she was noted interacting appropriately with peers and staff, compliant with medication regimen, emotional support and encouragement offered she was receptive, 15 minutes safety checks maintained will continue to monitor

## 2019-06-27 MED ORDER — TRAZODONE HCL 100 MG PO TABS
100.0000 mg | ORAL_TABLET | Freq: Every evening | ORAL | Status: DC | PRN
  Administered 2019-06-28: 21:00:00 100 mg via ORAL
  Filled 2019-06-27: qty 1

## 2019-06-27 MED ORDER — QUETIAPINE FUMARATE 200 MG PO TABS
400.0000 mg | ORAL_TABLET | Freq: Every day | ORAL | Status: DC
  Administered 2019-06-27 – 2019-06-28 (×2): 400 mg via ORAL
  Filled 2019-06-27 (×2): qty 2

## 2019-06-27 NOTE — Plan of Care (Signed)
D- Patient alert and oriented. Patient presented in a pleasant mood on assessment stating that she slept "ok" last night and had no complaints or concerns to voice to this Probation officer. Patient denied signs/symptoms of depression/anxiety, stating that overall she feels "pretty good". Patient also denied SI, HI, AVH, and pain at this time. Patient had no stated goals for today.  A- Scheduled medications administered to patient, per MD orders. Support and encouragement provided.  Routine safety checks conducted every 15 minutes.  Patient informed to notify staff with problems or concerns.  R- No adverse drug reactions noted. Patient contracts for safety at this time. Patient compliant with medications and treatment plan. Patient receptive, calm, and cooperative. Patient interacts well with others on the unit.  Patient remains safe at this time.  Problem: Education: Goal: Knowledge of [facility] Education information/materials will improve Outcome: Progressing   Problem: Coping: Goal: Ability to verbalize frustrations and anger appropriately will improve Outcome: Progressing   Problem: Safety: Goal: Periods of time without injury will increase Outcome: Progressing   Problem: Coping: Goal: Ability to interact with others will improve Outcome: Progressing Goal: Demonstration of participation in decision-making regarding own care will improve Outcome: Progressing   Problem: Self-Concept: Goal: Ability to identify factors that promote anxiety will improve Outcome: Progressing Goal: Level of anxiety will decrease Outcome: Progressing Goal: Ability to modify response to factors that promote anxiety will improve Outcome: Progressing

## 2019-06-27 NOTE — Plan of Care (Signed)
Remains cooperative and expressing needs appropriately. Alert and oriented. Denying thoughts of self harm. Denying hallucinations. Complained of anxiety and nervousness and received Vistaril. No additional concerns so far. Safety precautions maintained.

## 2019-06-27 NOTE — Plan of Care (Signed)
  Problem: Education: Goal: Knowledge of [facility] Education information/materials will improve Outcome: Not Progressing  D: Patient has been wailing loudly and screaming. Complaining that no one listens to her and she does not want to be here. Labile. Irritable. Verbally aggressive toward staff. Turned over the chair in her room. Admits to passive SI but contracting for safety.  A: Given medication for agitation per prn order. R: Patient safety maintained.

## 2019-06-27 NOTE — Progress Notes (Signed)
D: Patient has been wailing loudly and screaming. Complaining that no one listens to her and she does not want to be here. Labile. Irritable. Verbally aggressive toward staff. Turned over the chair in her room. Admits to passive SI but contracting for safety. Covid test done today was negative. Patient instructed to stay in room  A: Given medication for agitation per prn order. R: Patient safety maintained.

## 2019-06-27 NOTE — Progress Notes (Signed)
Patient presented to the nurses station requesting "something for nervousness". Received Vistaril 25 mg by mouth as prescribed.

## 2019-06-27 NOTE — Progress Notes (Signed)
[facility] MD Progress Note  06/27/2019 10:36 AM [name]  MRN:  [id number] Subjective:  Patient is a 18 year old female with a history of multiple psychiatric hospitalizations who presented to the [facility] emergency department on 12/9 with psychosis, delusional thinking, and agitation.  Objective: Patient is seen and examined.  Patient is a 18 year old female with the above-stated past psychiatric history who is seen in follow-up.  This morning she is quite sleepy, and more appropriate.  Review of the nursing notes showed that she was yelling and screaming in approximately 5:20 AM.  She was noted to be labile and irritable.  Her vital signs are stable, she is afebrile.  Repeat coronavirus testing was negative.  It stated that she slept 8.5 hours last night.  She denies suicidal ideation this morning, but she is awfully sedated.  Principal Problem: MDD (major depressive disorder), recurrent, severe, with psychosis ([place]) Diagnosis: Principal Problem:   MDD (major depressive disorder), recurrent, severe, with psychosis ([place]) Active Problems:   Recurrent UTI (urinary tract infection) postcoital   Polysubstance abuse ([place])  Total Time spent with patient: 20 minutes  Past Psychiatric History: See admission H&P  Past Medical History:  Past Medical History:  Diagnosis Date  . Burning with urination 05/03/2015  . Contraceptive management 05/03/2015  . Depression   . Dysmenorrhea 12/30/2013  . Heroin addiction ([place])   . Menorrhagia 12/30/2013  . Menstrual extraction 12/30/2013  . Migraines   . Social anxiety disorder 09/25/2016  . Vaginal odor 05/03/2015    Past Surgical History:  Procedure Laterality Date  . NO PAST SURGERIES     Family History:  Family History  Problem Relation Age of Onset  . Depression Mother   . Hypertension Father   . Hyperlipidemia Father   . Cancer Paternal Grandmother        breast, uterine  . Cirrhosis Paternal Grandfather        due to alcohol   Family  Psychiatric  History: See admission H&P Social History:  Social History   Substance and Sexual Activity  Alcohol Use Yes   Comment: BAC was clear     Social History   Substance and Sexual Activity  Drug Use Not Currently  . Types: Marijuana, Cocaine, Oxycodone   Comment: opitates, xanax, canned air -- UDS was negative    Social History   Socioeconomic History  . Marital status: Single    Spouse name: Not on file  . Number of children: Not on file  . Years of education: Not on file  . Highest education level: Not on file  Occupational History  . Occupation: Unemployed  Tobacco Use  . Smoking status: Current Every Day Smoker    Packs/day: 0.25    Types: Cigarettes  . Smokeless tobacco: Never Used  . Tobacco comment: parents smoke   Substance and Sexual Activity  . Alcohol use: Yes    Comment: BAC was clear  . Drug use: Not Currently    Types: Marijuana, Cocaine, Oxycodone    Comment: opitates, xanax, canned air -- UDS was negative  . Sexual activity: Yes    Birth control/protection: Implant  Other Topics Concern  . Not on file  Social History Narrative   06/23/2019:  Pt stated that she is homeless, that she is a high school graduate, and that she is unemployed and not followed by any outpatient provider.      Lives with Dad. Mom has LGD, lives in [place]. 11th grader. Dog.   Social Determinants  of Health   Financial Resource Strain:   . Difficulty of Paying Living Expenses: Not on file  Food Insecurity:   . Worried About Charity fundraiser in the Last Year: Not on file  . Ran Out of Food in the Last Year: Not on file  Transportation Needs:   . Lack of Transportation (Medical): Not on file  . Lack of Transportation (Non-Medical): Not on file  Physical Activity:   . Days of Exercise per Week: Not on file  . Minutes of Exercise per Session: Not on file  Stress:   . Feeling of Stress : Not on file  Social Connections:   . Frequency of Communication with Friends and  Family: Not on file  . Frequency of Social Gatherings with Friends and Family: Not on file  . Attends Religious Services: Not on file  . Active Member of Clubs or Organizations: Not on file  . Attends Archivist Meetings: Not on file  . Marital Status: Not on file   Additional Social History:                         Sleep: Good  Appetite:  Fair  Current Medications: Current Facility-Administered Medications  Medication Dose Route Frequency Provider Last Rate Last Admin  . acetaminophen (TYLENOL) tablet 650 mg  650 mg Oral Q6H PRN Money, Lowry Ram, FNP   650 mg at 06/26/19 0810  . alum & mag hydroxide-simeth (MAALOX/MYLANTA) 200-200-20 MG/5ML suspension 30 mL  30 mL Oral Q4H PRN Money, Darnelle Maffucci B, FNP      . diphenhydrAMINE (BENADRYL) capsule 50 mg  50 mg Oral Q6H PRN Money, Lowry Ram, FNP   50 mg at 06/26/19 2015   Or  . diphenhydrAMINE (BENADRYL) injection 50 mg  50 mg Intramuscular Q6H PRN Money, Lowry Ram, FNP      . FLUoxetine (PROZAC) capsule 30 mg  30 mg Oral Daily Sharma Covert, MD      . haloperidol (HALDOL) tablet 5 mg  5 mg Oral Q6H PRN Money, Lowry Ram, FNP   5 mg at 06/26/19 2015   Or  . haloperidol lactate (HALDOL) injection 5 mg  5 mg Intramuscular Q6H PRN Money, Lowry Ram, FNP      . hydrOXYzine (ATARAX/VISTARIL) tablet 25 mg  25 mg Oral Q6H PRN [name], [name], MD   25 mg at 06/26/19 2015  . ibuprofen (ADVIL) tablet 600 mg  600 mg Oral Q6H PRN Sharma Covert, MD   600 mg at 06/26/19 1419  . LORazepam (ATIVAN) tablet 2 mg  2 mg Oral Q6H PRN Money, Lowry Ram, FNP   2 mg at 06/26/19 2014   Or  . LORazepam (ATIVAN) injection 2 mg  2 mg Intramuscular Q6H PRN Money, Lowry Ram, FNP   2 mg at 06/25/19 1229  . magnesium hydroxide (MILK OF MAGNESIA) suspension 30 mL  30 mL Oral Daily PRN Money, Darnelle Maffucci B, FNP   30 mL at 06/25/19 0934  . nicotine (NICODERM CQ - dosed in mg/24 hours) patch 21 mg  21 mg Transdermal Daily Money, Lowry Ram, FNP   21 mg at 06/26/19  0754  . QUEtiapine (SEROQUEL) tablet 300 mg  300 mg Oral QHS Money, Lowry Ram, FNP   300 mg at 06/26/19 2014  . QUEtiapine (SEROQUEL) tablet 50 mg  50 mg Oral Daily Sharma Covert, MD   50 mg at 06/26/19 1213  . traZODone (DESYREL) tablet 50  mg  50 mg Oral QHS PRN Money, Lowry Ram, FNP   50 mg at 06/25/19 2044    Lab Results:  Results for orders placed or performed during the hospital encounter of 06/23/19 (from the past 48 hour(s))  CBC     Status: None   Collection Time: 06/25/19  5:06 PM  Result Value Ref Range   WBC 7.5 4.0 - 10.5 K/uL   RBC 4.21 3.87 - 5.11 MIL/uL   Hemoglobin 12.3 12.0 - 15.0 g/dL   HCT 37.6 36.0 - 46.0 %   MCV 89.3 80.0 - 100.0 fL   MCH 29.2 26.0 - 34.0 pg   MCHC 32.7 30.0 - 36.0 g/dL   RDW 12.1 11.5 - 15.5 %   Platelets 272 150 - 400 K/uL   nRBC 0.0 0.0 - 0.2 %    Comment: Performed at [facility], [address] [place], [place] [address]  Urinalysis, Complete w Microscopic     Status: Abnormal   Collection Time: 06/26/19  4:00 PM  Result Value Ref Range   Color, Urine COLORLESS (A) YELLOW   APPearance CLEAR (A) CLEAR   Specific Gravity, Urine 1.001 (L) 1.005 - 1.030   pH 6.0 5.0 - 8.0   Glucose, UA NEGATIVE NEGATIVE mg/dL   Hgb urine dipstick NEGATIVE NEGATIVE   Bilirubin Urine NEGATIVE NEGATIVE   Ketones, ur NEGATIVE NEGATIVE mg/dL   Protein, ur NEGATIVE NEGATIVE mg/dL   Nitrite NEGATIVE NEGATIVE   Leukocytes,Ua NEGATIVE NEGATIVE   WBC, UA NONE SEEN 0 - 5 WBC/hpf   Bacteria, UA NONE SEEN NONE SEEN   Squamous Epithelial / LPF 0-5 0 - 5    Comment: Performed at [facility], [address], [place],  [address]  Respiratory Panel by RT PCR (Flu A&B, Covid) -     Status: None   Collection Time: 06/26/19  7:00 PM  Result Value Ref Range   SARS Coronavirus 2 by RT PCR NEGATIVE NEGATIVE    Comment: (NOTE) SARS-CoV-2 target nucleic acids are NOT DETECTED. The SARS-CoV-2 RNA is generally detectable in upper  respiratoy specimens during the acute phase of infection. The lowest concentration of SARS-CoV-2 viral copies this assay can detect is 131 copies/mL. A negative result does not preclude SARS-Cov-2 infection and should not be used as the sole basis for treatment or other patient management decisions. A negative result may occur with  improper specimen collection/handling, submission of specimen other than nasopharyngeal swab, presence of viral mutation(s) within the areas targeted by this assay, and inadequate number of viral copies (<131 copies/mL). A negative result must be combined with clinical observations, patient history, and epidemiological information. The expected result is Negative. Fact Sheet for Patients:  PinkCheek.be Fact Sheet for Healthcare Providers:  GravelBags.it This test is not yet ap proved or cleared by the [place] FDA and  has been authorized for detection and/or diagnosis of SARS-CoV-2 by FDA under an Emergency Use Authorization (EUA). This EUA will remain  in effect (meaning this test can be used) for the duration of the COVID-19 declaration under Section 564(b)(1) of the Act, 21 U.S.C. section 360bbb-3(b)(1), unless the authorization is terminated or revoked sooner.    Influenza A by PCR NEGATIVE NEGATIVE   Influenza B by PCR NEGATIVE NEGATIVE    Comment: (NOTE) The Xpert Xpress SARS-CoV-2/FLU/RSV assay is intended as an aid in  the diagnosis of influenza from Nasopharyngeal swab specimens and  should not be used as a sole basis for treatment. Nasal  washings and  aspirates are unacceptable for Xpert Xpress SARS-CoV-2/FLU/RSV  testing. Fact Sheet for Patients: PinkCheek.be Fact Sheet for Healthcare Providers: GravelBags.it This test is not yet approved or cleared by the [place] FDA and  has been authorized for detection and/or  diagnosis of SARS-CoV-2 by  FDA under an Emergency Use Authorization (EUA). This EUA will remain  in effect (meaning this test can be used) for the duration of the  Covid-19 declaration under Section 564(b)(1) of the Act, 21  U.S.C. section 360bbb-3(b)(1), unless the authorization is  terminated or revoked. Performed at [facility], [address] [place], [place] [address]     Blood Alcohol level:  Lab Results  Component Value Date   [facility] <10 06/23/2019   ETH <10 99991111    Metabolic Disorder Labs: Lab Results  Component Value Date   HGBA1C 5.2 05/17/2019   MPG 102.54 05/17/2019   MPG 108.28 02/17/2019   Lab Results  Component Value Date   PROLACTIN 31.9 (H) 02/17/2019   PROLACTIN 29.6 (H) 09/25/2016   Lab Results  Component Value Date   CHOL 144 05/17/2019   TRIG 65 05/17/2019   HDL 35 (L) 05/17/2019   CHOLHDL 4.1 05/17/2019   VLDL 13 05/17/2019   LDLCALC 96 05/17/2019   LDLCALC 76 02/17/2019    Physical Findings: AIMS: Facial and Oral Movements Muscles of Facial Expression: None, normal Lips and Perioral Area: None, normal Jaw: None, normal Tongue: None, normal,Extremity Movements Upper (arms, wrists, hands, fingers): None, normal Lower (legs, knees, ankles, toes): None, normal, Trunk Movements Neck, shoulders, hips: None, normal, Overall Severity Severity of abnormal movements (highest score from questions above): None, normal Incapacitation due to abnormal movements: None, normal Patient's awareness of abnormal movements (rate only patient's report): No Awareness, Dental Status Current problems with teeth and/or dentures?: No Does patient usually wear dentures?: No  CIWA:    COWS:     Musculoskeletal: Strength & Muscle Tone: within normal limits Gait & Station: normal Patient leans: N/A  Psychiatric Specialty Exam: Physical Exam  Nursing note and vitals reviewed. Constitutional: She is oriented to person, place, and time. She  appears well-developed and well-nourished.  HENT:  Head: Normocephalic and atraumatic.  Respiratory: Effort normal.  Neurological: She is alert and oriented to person, place, and time.    Review of Systems  Blood pressure 95/74, pulse (!) 102, temperature 98.8 F (37.1 C), temperature source Oral, resp. rate 18, height 5\' 3"  (1.6 m), weight 65.8 kg, SpO2 98 %.Body mass index is 25.69 kg/m.  General Appearance: Disheveled  Eye Contact:  Minimal  Speech:  Normal Rate  Volume:  Decreased  Mood:  Dysphoric  Affect:  Sedated  Thought Process:  Coherent and Descriptions of Associations: Circumstantial  Orientation:  Full (Time, Place, and Person)  Thought Content:  Rumination  Suicidal Thoughts:  No  Homicidal Thoughts:  No  Memory:  Immediate;   Fair Recent;   Fair Remote;   Fair  Judgement:  Impaired  Insight:  Lacking  Psychomotor Activity:  Decreased  Concentration:  Concentration: Fair and Attention Span: Fair  Recall:  [employer] of Knowledge:  Fair  Language:  Fair  Akathisia:  Negative  Handed:  Right  AIMS (if indicated):     Assets:  Desire for Improvement Resilience  ADL's:  Intact  Cognition:  WNL  Sleep:  Number of Hours: 8.5     Treatment Plan Summary: Daily contact with patient to assess and evaluate symptoms and progress  in treatment, Medication management and Plan : Patient is seen and examined.  Patient is a 18 year old female with the above-stated past psychiatric history who is seen in follow-up.  Diagnosis: #1 psychosis, including differential of schizoaffective disorder versus psychotic depression, #2 history of polysubstance abuse, but with most recent negative drug screen and negative blood alcohol, #3 urinary tract infection  Patient is seen in follow-up.  This morning she is significantly sedated, but had agitation throughout the night last night.  Her Seroquel was 300 mg p.o. nightly, and I will increase that to 400 mg today.  We will continue with  the daytime dose, but increase that to 100 mg.  No change in her fluoxetine or trazodone at this point.  Her repeat urinalysis from 12/12 showed no evidence of infection. 1.  Continue fluoxetine 30 mg p.o. daily for depression and anxiety. 2.  Continue hydroxyzine 25 mg p.o. every 6 hours as needed anxiety. 3.  Continue ibuprofen 600 mg p.o. every 6 hours as needed pain. 4.  Continue lorazepam 2 mg p.o. or IM every 6 hours as needed agitation or anxiety. 5.  Increase Seroquel to 100 mg p.o. daily and 400 mg p.o. nightly for mood stability and agitation. 6.  Continue trazodone 50 mg p.o. nightly as needed insomnia. 7.  Disposition planning-in progress. Sharma Covert, MD 06/27/2019, 10:36 AM

## 2019-06-28 MED ORDER — QUETIAPINE FUMARATE 50 MG PO TABS: 50.0000 mg | ORAL_TABLET | Freq: Every day | ORAL | 1 refills | Status: DC

## 2019-06-28 MED ORDER — HYDROXYZINE HCL 25 MG PO TABS: 25.0000 mg | ORAL_TABLET | Freq: Four times a day (QID) | ORAL | 1 refills | Status: DC | PRN

## 2019-06-28 MED ORDER — QUETIAPINE FUMARATE 400 MG PO TABS: 400.0000 mg | ORAL_TABLET | Freq: Every day | ORAL | 1 refills | Status: DC

## 2019-06-28 MED ORDER — FLUOXETINE HCL 10 MG PO CAPS: 30.0000 mg | ORAL_CAPSULE | Freq: Every day | ORAL | 1 refills | Status: DC

## 2019-06-28 NOTE — BHH Counselor (Signed)
CSW called patient's father as a follow up to the patient's call with her father.  Patient's father reports that he lives in "an old folks home" and patient can not stay with him. He reports that patient has burned bridges with peers and family and "no one feels comfortable letting her stay with them because she can be vindictive when upset".  Carly[place], MSW, LCSW 06/28/2019 3:21 PM

## 2019-06-28 NOTE — Progress Notes (Signed)
Recreation Therapy Notes   Date: 06/28/2019  Time: 9:30 am    Recreation Therapy group canceled due to COVID-19.     Raivyn Kabler LRT/CTRS        Easter Schinke 06/28/2019 11:43 AM

## 2019-06-28 NOTE — Plan of Care (Addendum)
D- Patient alert and oriented. Patient presented in a pleasant mood on assessment stating that she slept fair last night and had no complaints to voice to this Probation officer. Patient denied signs/symptoms of depression, however, she rated her anxiety a "2/10", stating that "life" is making her anxious. Patient also denied SI, HI, AVH, and pain at this time. Patient's goal for today is "to discharge soon", in which she will "take action/ think more positive" in order to achieve her goal.  A- Scheduled medications administered to patient, per MD orders. Support and encouragement provided.  Routine safety checks conducted every 15 minutes.  Patient informed to notify staff with problems or concerns.  R- No adverse drug reactions noted. Patient contracts for safety at this time. Patient compliant with medications and treatment plan. Patient receptive, calm, and cooperative. Patient interacts well with others on the unit.  Patient remains safe at this time.  Problem: Education: Goal: Knowledge of [facility] Education information/materials will improve Outcome: Progressing   Problem: Coping: Goal: Ability to verbalize frustrations and anger appropriately will improve Outcome: Progressing   Problem: Safety: Goal: Periods of time without injury will increase Outcome: Progressing   Problem: Coping: Goal: Ability to interact with others will improve Outcome: Progressing Goal: Demonstration of participation in decision-making regarding own care will improve Outcome: Progressing   Problem: Self-Concept: Goal: Ability to identify factors that promote anxiety will improve Outcome: Progressing Goal: Level of anxiety will decrease Outcome: Progressing Goal: Ability to modify response to factors that promote anxiety will improve Outcome: Progressing

## 2019-06-28 NOTE — Progress Notes (Signed)
  [facility] Adult Case Management Discharge Plan :  Will you be returning to the same living situation after discharge:  No. At discharge, do you have transportation home?: Yes,  pt father will provide transportation. Do you have the ability to pay for your medications: No.  Release of information consent forms completed and in the chart;  Patient's signature needed at discharge.  Patient to Follow up at: Follow-up Information    [facility] Follow up on 06/30/2019.   Why: Please attend your appointment scheduled for 8:00AM on 06/30/2019.  Please bring photo ID, insurance card or proof income and copy of hospital dishcarge paperwork. Contact information: [place] [address] [phone]           Next level of care provider has access to [place] and Suicide Prevention discussed: Yes,  SPE completed with patient's father.     Has patient been referred to the Quitline?: Patient refused referral  Patient has been referred for addiction treatment: Pt. refused referral  Rozann Lesches, LCSW 06/28/2019, 1:58 PM

## 2019-06-28 NOTE — Plan of Care (Signed)
Pt remained in her room quiet and immersed in her thoughts. Pt took her hs medications. Pt not saying anything when I attempted to talk to her. Q15 minute safety checks maintained.  Problem: Education: Goal: Knowledge of [facility] Education information/materials will improve Outcome: Progressing   Problem: Coping: Goal: Ability to verbalize frustrations and anger appropriately will improve Outcome: Progressing   Problem: Safety: Goal: Periods of time without injury will increase Outcome: Progressing   Problem: Coping: Goal: Ability to interact with others will improve Outcome: Progressing Goal: Demonstration of participation in decision-making regarding own care will improve Outcome: Progressing   Problem: Self-Concept: Goal: Ability to identify factors that promote anxiety will improve Outcome: Progressing Goal: Level of anxiety will decrease Outcome: Progressing Goal: Ability to modify response to factors that promote anxiety will improve Outcome: Progressing

## 2019-06-28 NOTE — BHH Suicide Risk Assessment (Signed)
[facility] Discharge Suicide Risk Assessment   Principal Problem: MDD (major depressive disorder), recurrent, severe, with psychosis ([place]) Discharge Diagnoses: Principal Problem:   MDD (major depressive disorder), recurrent, severe, with psychosis ([place]) Active Problems:   Recurrent UTI (urinary tract infection) postcoital   Polysubstance abuse ([place])   Total Time spent with patient: 30 minutes  Musculoskeletal: Strength & Muscle Tone: within normal limits Gait & Station: normal Patient leans: N/A  Psychiatric Specialty Exam: Review of Systems  Constitutional: Negative.   HENT: Negative.   Eyes: Negative.   Respiratory: Negative.   Cardiovascular: Negative.   Gastrointestinal: Negative.   Musculoskeletal: Negative.   Skin: Negative.   Neurological: Negative.   Psychiatric/Behavioral: Negative.     Blood pressure 91/63, pulse 95, temperature 98.7 F (37.1 C), temperature source Oral, resp. rate 16, height 5\' 3"  (1.6 m), weight 65.8 kg, SpO2 98 %.Body mass index is 25.69 kg/m.  General Appearance: Casual  Eye Contact::  Fair  Speech:  Slow409  Volume:  Decreased  Mood:  Euthymic  Affect:  Constricted  Thought Process:  Coherent  Orientation:  Full (Time, Place, and Person)  Thought Content:  Logical  Suicidal Thoughts:  No  Homicidal Thoughts:  No  Memory:  Immediate;   Fair Recent;   Fair Remote;   Fair  Judgement:  Fair  Insight:  Fair  Psychomotor Activity:  Normal  Concentration:  Fair  Recall:  [employer] of [place]  Language: Fair  Akathisia:  No  Handed:  Right  AIMS (if indicated):     Assets:  Desire for Improvement Housing Physical Health Resilience Social Support  Sleep:  Number of Hours: 7  Cognition: Impaired,  Mild  ADL's:  Intact   Mental Status Per Nursing Assessment::   On Admission:  Suicidal ideation indicated by patient  Demographic Factors:  Adolescent or young adult, Caucasian and Unemployed  Loss Factors: NA  Historical  Factors: Impulsivity  Risk Reduction Factors:   Religious beliefs about death, Living with another person, especially a relative, Positive social support and Positive therapeutic relationship  Continued Clinical Symptoms:  Depression:   Anhedonia  Cognitive Features That Contribute To Risk:  Loss of executive function    Suicide Risk:  Minimal: No identifiable suicidal ideation.  Patients presenting with no risk factors but with morbid ruminations; may be classified as minimal risk based on the severity of the depressive symptoms    Plan Of Care/Follow-up recommendations:  Activity:  Activity as tolerated Diet:  Regular diet Other:  Follow-up with outpatient mental health treatment in the community with usual provider continue current medicine  Alethia Berthold, MD 06/28/2019, 12:01 PM

## 2019-06-28 NOTE — BHH Counselor (Signed)
CSW contacted the following in an effort to identify possible bed placement for patient:  [facility] [phone] Shelter is closed until 12/17.  CSW attempted to speak with Clarene Critchley the head of the shleter to confirm but had to leave HIPAA compliant voicemail.  [facility], [phone] No answer, HIPAA compliant voicemail left.  [facility], [phone] No answer, unable to leave VM  [facility] No response to text at this time.  [facility], [phone] CSW left HIPAA compliant voicemail.  [facility], [phone] No answer, CSW left HIPAA compliant voicemail.  [facility], [phone] No answer, CSW left HIPAA compliant voicemail.  [facility], [phone] No answer.  Phone rang for some time then became a busy signal.  [facility], [phone] CSW was provided names of shelters to attempt.  Names include [facility], [facility] and [facility] Pt will need to call and speak with a Librarian, academic.  CSW provided pt with the contact information.  [employer] CSW left HIPAA compliant voicemail.  [facility], [phone] Mailbox was full.  [name], MSW, LCSW 06/28/2019 3:33 PM

## 2019-06-28 NOTE — Discharge Summary (Signed)
Physician Discharge Summary Note  Patient:  Julia [name] is an 18 y.o., female MRN:  [id number] DOB:  April 18, 2001 Patient phone:  [phone] (home)  Patient address:   [address] [place] [address],  Total Time spent with patient: 30 minutes  Date of Admission:  06/23/2019 Date of Discharge: June 28, 2019  Reason for Admission: Patient was admitted because of return of extreme mood lability suicidal ideation agitation and disorganized thinking  Principal Problem: MDD (major depressive disorder), recurrent, severe, with psychosis ([place]) Discharge Diagnoses: Principal Problem:   MDD (major depressive disorder), recurrent, severe, with psychosis ([place]) Active Problems:   Recurrent UTI (urinary tract infection) postcoital   Polysubstance abuse ([place])   Past Psychiatric History: History of bipolar disorder with several prior hospitalizations history of self injury.  History of polysubstance abuse  Past Medical History:  Past Medical History:  Diagnosis Date  . Burning with urination 05/03/2015  . Contraceptive management 05/03/2015  . Depression   . Dysmenorrhea 12/30/2013  . Heroin addiction ([place])   . Menorrhagia 12/30/2013  . Menstrual extraction 12/30/2013  . Migraines   . Social anxiety disorder 09/25/2016  . Vaginal odor 05/03/2015    Past Surgical History:  Procedure Laterality Date  . NO PAST SURGERIES     Family History:  Family History  Problem Relation Age of Onset  . Depression Mother   . Hypertension Father   . Hyperlipidemia Father   . Cancer Paternal Grandmother        breast, uterine  . Cirrhosis Paternal Grandfather        due to alcohol   Family Psychiatric  History: See previous Social History:  Social History   Substance and Sexual Activity  Alcohol Use Yes   Comment: BAC was clear     Social History   Substance and Sexual Activity  Drug Use Not Currently  . Types: Marijuana, Cocaine, Oxycodone   Comment: opitates, xanax, canned air --  UDS was negative    Social History   Socioeconomic History  . Marital status: Single    Spouse name: Not on file  . Number of children: Not on file  . Years of education: Not on file  . Highest education level: Not on file  Occupational History  . Occupation: Unemployed  Tobacco Use  . Smoking status: Current Every Day Smoker    Packs/day: 0.25    Types: Cigarettes  . Smokeless tobacco: Never Used  . Tobacco comment: parents smoke   Substance and Sexual Activity  . Alcohol use: Yes    Comment: BAC was clear  . Drug use: Not Currently    Types: Marijuana, Cocaine, Oxycodone    Comment: opitates, xanax, canned air -- UDS was negative  . Sexual activity: Yes    Birth control/protection: Implant  Other Topics Concern  . Not on file  Social History Narrative   06/23/2019:  Pt stated that she is homeless, that she is a high school graduate, and that she is unemployed and not followed by any outpatient provider.      Lives with Dad. Mom has LGD, lives in [place]. 11th grader. Dog.   Social Determinants of Health   Financial Resource Strain:   . Difficulty of Paying Living Expenses: Not on file  Food Insecurity:   . Worried About Charity fundraiser in the Last Year: Not on file  . Ran Out of Food in the Last Year: Not on file  Transportation Needs:   . Lack of  Transportation (Medical): Not on file  . Lack of Transportation (Non-Medical): Not on file  Physical Activity:   . Days of Exercise per Week: Not on file  . Minutes of Exercise per Session: Not on file  Stress:   . Feeling of Stress : Not on file  Social Connections:   . Frequency of Communication with Friends and Family: Not on file  . Frequency of Social Gatherings with Friends and Family: Not on file  . Attends Religious Services: Not on file  . Active Member of Clubs or Organizations: Not on file  . Attends Archivist Meetings: Not on file  . Marital Status: Not on file    Hospital Course: Patient  admitted to the psychiatric unit.  15-minute checks for safety.  Patient was agitated at times with frequent episodes of shouting out often during the morning hours.  Usually could be redirected from this.  Initially she was very agitated and anxious.  Did not however make any attempt at self-harm or voiced any specific suicidal ideation.  Medications were adjusted including increasing the dose of Seroquel.  She began to sleep better and mood became calmer.  At the time of discharge she denies any hallucinations.  She denies any suicidal or homicidal thoughts.  She indicates agreement with continued follow-up with outpatient providers and will be hoping to stay with her father at discharge.  Reviewed medications and treatment plan with patient including the importance of staying off of any drugs of abuse.  Physical Findings: AIMS: Facial and Oral Movements Muscles of Facial Expression: None, normal Lips and Perioral Area: None, normal Jaw: None, normal Tongue: None, normal,Extremity Movements Upper (arms, wrists, hands, fingers): None, normal Lower (legs, knees, ankles, toes): None, normal, Trunk Movements Neck, shoulders, hips: None, normal, Overall Severity Severity of abnormal movements (highest score from questions above): None, normal Incapacitation due to abnormal movements: None, normal Patient's awareness of abnormal movements (rate only patient's report): No Awareness, Dental Status Current problems with teeth and/or dentures?: No Does patient usually wear dentures?: No  CIWA:    COWS:     Musculoskeletal: Strength & Muscle Tone: within normal limits Gait & Station: normal Patient leans: N/A  Psychiatric Specialty Exam: Physical Exam  Nursing note and vitals reviewed. Constitutional: She appears well-developed and well-nourished.  HENT:  Head: Normocephalic and atraumatic.  Eyes: Pupils are equal, round, and reactive to light. Conjunctivae are normal.  Cardiovascular: Regular  rhythm and normal heart sounds.  Respiratory: Effort normal.  GI: Soft.  Musculoskeletal:        General: Normal range of motion.     Cervical back: Normal range of motion.  Neurological: She is alert.  Skin: Skin is warm and dry.  Psychiatric: Judgment normal. Her affect is blunt. Her speech is delayed. She is slowed. Thought content is not paranoid. Cognition and memory are normal. She expresses no homicidal and no suicidal ideation.    Review of Systems  Constitutional: Negative.   HENT: Negative.   Eyes: Negative.   Respiratory: Negative.   Cardiovascular: Negative.   Gastrointestinal: Negative.   Musculoskeletal: Negative.   Skin: Negative.   Neurological: Negative.   Psychiatric/Behavioral: Negative.     Blood pressure 91/63, pulse 95, temperature 98.7 F (37.1 C), temperature source Oral, resp. rate 16, height 5\' 3"  (1.6 m), weight 65.8 kg, SpO2 98 %.Body mass index is 25.69 kg/m.  General Appearance: Casual  Eye Contact:  Good  Speech:  Clear and Coherent  Volume:  Normal  Mood:  Euthymic  Affect:  Congruent  Thought Process:  Goal Directed  Orientation:  Full (Time, Place, and Person)  Thought Content:  Logical  Suicidal Thoughts:  No  Homicidal Thoughts:  No  Memory:  Immediate;   Fair Recent;   Fair Remote;   Fair  Judgement:  Fair  Insight:  Fair  Psychomotor Activity:  Normal  Concentration:  Concentration: Fair  Recall:  [place] of Knowledge:  Fair  Language:  Fair  Akathisia:  No  Handed:  Right  AIMS (if indicated):     Assets:  Desire for Improvement Housing Physical Health Resilience Social Support  ADL's:  Intact  Cognition:  WNL  Sleep:  Number of Hours: 7        Has this patient used any form of tobacco in the last 30 days? (Cigarettes, Smokeless Tobacco, Cigars, and/or Pipes) Yes, Yes, A prescription for an FDA-approved tobacco cessation medication was offered at discharge and the patient refused  Blood Alcohol level:  Lab  Results  Component Value Date   [facility] <10 06/23/2019   ETH <10 [id number]    Metabolic Disorder Labs:  Lab Results  Component Value Date   HGBA1C 5.2 05/17/2019   MPG 102.54 05/17/2019   MPG 108.28 02/17/2019   Lab Results  Component Value Date   PROLACTIN 31.9 (H) 02/17/2019   PROLACTIN 29.6 (H) 09/25/2016   Lab Results  Component Value Date   CHOL 144 05/17/2019   TRIG 65 05/17/2019   HDL 35 (L) 05/17/2019   CHOLHDL 4.1 05/17/2019   VLDL 13 05/17/2019   LDLCALC 96 05/17/2019   [place] 76 02/17/2019    See Psychiatric Specialty Exam and Suicide Risk Assessment completed by Attending Physician prior to discharge.  Discharge destination:  Home  Is patient on multiple antipsychotic therapies at discharge:  No   Has Patient had three or more failed trials of antipsychotic monotherapy by history:  No  Recommended Plan for Multiple Antipsychotic Therapies: NA  Discharge Instructions    Diet - low sodium heart healthy   Complete by: As directed    Increase activity slowly   Complete by: As directed      Allergies as of 06/28/2019      Reactions   Abilify [aripiprazole]    Zoloft [sertraline Hcl]       Medication List    STOP taking these medications   ibuprofen 600 MG tablet Commonly known as: ADVIL   lamoTRIgine 25 MG tablet Commonly known as: LAMICTAL   nicotine polacrilex 2 MG gum Commonly known as: NICORETTE     TAKE these medications     Indication  etonogestrel 68 MG Impl implant Commonly known as: Nexplanon 1 each (68 mg total) by Subdermal route once for 1 dose. (Birth control method)  Indication: Birth Control Treatment   FLUoxetine 10 MG capsule Commonly known as: PROZAC Take 3 capsules (30 mg total) by mouth daily. Start taking on: June 29, 2019 What changed:   medication strength  how much to take  Indication: Depression   hydrOXYzine 25 MG tablet Commonly known as: ATARAX/VISTARIL Take 1 tablet (25 mg total) by mouth every  6 (six) hours as needed for anxiety. What changed: when to take this  Indication: Feeling Anxious   QUEtiapine 400 MG tablet Commonly known as: SEROQUEL Take 1 tablet (400 mg total) by mouth at bedtime. What changed:   medication strength  how much to take  Indication: Depressive Phase of Manic-Depression  QUEtiapine 50 MG tablet Commonly known as: SEROQUEL Take 1 tablet (50 mg total) by mouth daily. Start taking on: June 29, 2019 What changed: You were already taking a medication with the same name, and this prescription was added. Make sure you understand how and when to take each.  Indication: Depressive Phase of Manic-Depression        Follow-up recommendations:  Activity:  Activity as tolerated Diet:  Regular diet Other:  Continue current medicine follow-up with outpatient treatment  Comments: Prescriptions given at discharge  Signed: Alethia Berthold, MD 06/28/2019, 12:06 PM

## 2019-06-28 NOTE — Progress Notes (Signed)
Recreation Therapy Notes   Date: 06/28/2019  Time: 9:30 am    Patient offered activity book and accepted.    Julia [name] LRT/CTRS        Julia [name] 06/28/2019 11:40 AM

## 2019-06-28 NOTE — BHH Group Notes (Signed)
LCSW Group Therapy Note   06/28/2019 1:00 PM  Type of Therapy and Topic:  Group Therapy:  Overcoming Obstacles   Participation Level:  Did Not Attend   Description of Group:    In this group patients will be encouraged to explore what they see as obstacles to their own wellness and recovery. They will be guided to discuss their thoughts, feelings, and behaviors related to these obstacles. The group will process together ways to cope with barriers, with attention given to specific choices patients can make. Each patient will be challenged to identify changes they are motivated to make in order to overcome their obstacles. This group will be process-oriented, with patients participating in exploration of their own experiences as well as giving and receiving support and challenge from other group members.   Therapeutic Goals: 1. Patient will identify personal and current obstacles as they relate to admission. 2. Patient will identify barriers that currently interfere with their wellness or overcoming obstacles.  3. Patient will identify feelings, thought process and behaviors related to these barriers. 4. Patient will identify two changes they are willing to make to overcome these obstacles:      Summary of Patient Progress Social Work group canceled due to [employer].      Therapeutic Modalities:   Cognitive Behavioral Therapy Solution Focused Therapy Motivational Interviewing Relapse Prevention Therapy  [name], MSW, LCSW 06/28/2019 1:12 PM

## 2019-06-28 NOTE — Progress Notes (Signed)
Patient ID: Julia [name], female   DOB: 2000-07-31, 18 y.o.   MRN: [id number] Update to previous note.  Patient's father is now stating that she cannot come to stay with him.  Patient states she has no other place to stay no other family to contact no resources at all.  Patient very tearful and upset but responds to redirection.  At this point it appears impossible to discharge her today.  Discharge order canceled continue treatment otherwise as already in place.

## 2019-06-29 MED ORDER — FLUOXETINE HCL 10 MG PO CAPS: 30.0000 mg | ORAL_CAPSULE | Freq: Every day | ORAL | 1 refills | Status: DC

## 2019-06-29 MED ORDER — FLUOXETINE HCL 10 MG PO CAPS: 30.0000 mg | ORAL_CAPSULE | Freq: Every day | ORAL | 0 refills | Status: DC

## 2019-06-29 MED ORDER — QUETIAPINE FUMARATE 400 MG PO TABS: 400.0000 mg | ORAL_TABLET | Freq: Every day | ORAL | 1 refills | Status: DC

## 2019-06-29 MED ORDER — HYDROXYZINE HCL 25 MG PO TABS: 25.0000 mg | ORAL_TABLET | Freq: Four times a day (QID) | ORAL | 0 refills | Status: DC | PRN

## 2019-06-29 MED ORDER — HYDROXYZINE HCL 25 MG PO TABS: 25.0000 mg | ORAL_TABLET | Freq: Four times a day (QID) | ORAL | 1 refills | Status: DC | PRN

## 2019-06-29 MED ORDER — QUETIAPINE FUMARATE 50 MG PO TABS: 50.0000 mg | ORAL_TABLET | Freq: Every day | ORAL | 1 refills | Status: DC

## 2019-06-29 MED ORDER — QUETIAPINE FUMARATE 50 MG PO TABS: 50.0000 mg | ORAL_TABLET | Freq: Every day | ORAL | 0 refills | Status: DC

## 2019-06-29 MED ORDER — QUETIAPINE FUMARATE 400 MG PO TABS: 400.0000 mg | ORAL_TABLET | Freq: Every day | ORAL | 0 refills | Status: DC

## 2019-06-29 NOTE — Progress Notes (Signed)
Recreation Therapy Notes  INPATIENT RECREATION TR PLAN  Patient Details Name: Julia [name] MRN: [id number] DOB: 12-10-2000 Today's Date: 06/29/2019  Rec Therapy Plan Is patient appropriate for Therapeutic Recreation?: Yes Treatment times per week: At least 3 Estimated Length of Stay: 5-7 days TR Treatment/Interventions: Group participation (Comment)  Discharge Criteria Pt will be discharged from therapy if:: Discharged Treatment plan/goals/alternatives discussed and agreed upon by:: Patient/family  Discharge Summary Short term goals set: Patient will engage in groups without prompting or encouragement from LRT x3 group sessions within 5 recreation therapy group sessions Short term goals met: Adequate for discharge Progress toward goals comments: Groups attended Which groups?: Other (Comment)(Relaxation) Reason goals not met: N/A Therapeutic equipment acquired: N/A Reason patient discharged from therapy: Discharge from hospital Pt/family agrees with progress & goals achieved: Yes Date patient discharged from therapy: 06/29/19   Bhakti Labella 06/29/2019, 11:11 AM

## 2019-06-29 NOTE — Progress Notes (Signed)
D: Patient is aware of  Discharge this shift . A:Patient denies suicidal /homicidal ideations. Patient received all belongings brought in  No Storage medications. Writer reviewed Discharge Summary, Suicide Risk Assessment, and Transitional Record. Aware  Of follow up appointment . R: Patient left unit with no questions  Or concerns  With taxi to [facility]

## 2019-06-29 NOTE — BHH Counselor (Signed)
CSW spoke with [facility].  They report that they are accepting patients.   CSW confirmed that patient could be admitted with a print out of her photo from the chart and her demographic/face sheet.  [name], MSW, LCSW 06/29/2019 8:20 AM

## 2019-06-29 NOTE — Progress Notes (Signed)
  [facility] Adult Case Management Discharge Plan :  Will you be returning to the same living situation after discharge:  No. [facility] At discharge, do you have transportation home?: Yes,  Taxi Do you have the ability to pay for your medications: Yes,  mental health  Release of information consent forms completed and in the chart;    Patient to Follow up at: Follow-up [facility] Follow up.   Why: Please go Monday-Friday at 8:30AM to set up outpatient services. Please take photo i.d and hospital discharge paperwork with you. Thank You! Contact information: [address] [place] [address] Phone: [phone] Fax: [phone]          Next level of care provider has access to [place] and Suicide Prevention discussed: Yes,  SPE completed with patient's father.     Has patient been referred to the Quitline?: Patient refused referral  Patient has been referred for addiction treatment: Pt. refused referral  [place], LCSW 06/29/2019, 9:06 AM

## 2019-06-29 NOTE — Plan of Care (Signed)
  Problem: Education: Goal: Knowledge of [facility] Education information/materials will improve 06/29/2019 1104 by Leodis Liverpool, RN Outcome: Adequate for Discharge 06/29/2019 1104 by Leodis Liverpool, RN Outcome: Progressing   Problem: Coping: Goal: Ability to verbalize frustrations and anger appropriately will improve 06/29/2019 1104 by Leodis Liverpool, RN Outcome: Adequate for Discharge 06/29/2019 1104 by Leodis Liverpool, RN Outcome: Progressing   Problem: Safety: Goal: Periods of time without injury will increase 06/29/2019 1104 by Leodis Liverpool, RN Outcome: Adequate for Discharge 06/29/2019 1104 by Leodis Liverpool, RN Outcome: Progressing   Problem: Coping: Goal: Ability to interact with others will improve 06/29/2019 1104 by Leodis Liverpool, RN Outcome: Adequate for Discharge 06/29/2019 1104 by Leodis Liverpool, RN Outcome: Progressing Goal: Demonstration of participation in decision-making regarding own care will improve 06/29/2019 1104 by Leodis Liverpool, RN Outcome: Adequate for Discharge 06/29/2019 1104 by Leodis Liverpool, RN Outcome: Progressing   Problem: Self-Concept: Goal: Ability to identify factors that promote anxiety will improve 06/29/2019 1104 by Leodis Liverpool, RN Outcome: Adequate for Discharge 06/29/2019 1104 by Leodis Liverpool, RN Outcome: Progressing Goal: Level of anxiety will decrease 06/29/2019 1104 by Leodis Liverpool, RN Outcome: Adequate for Discharge 06/29/2019 1104 by Leodis Liverpool, RN Outcome: Progressing Goal: Ability to modify response to factors that promote anxiety will improve 06/29/2019 1104 by Leodis Liverpool, RN Outcome: Adequate for Discharge 06/29/2019 1104 by Leodis Liverpool, RN Outcome: Progressing

## 2019-06-29 NOTE — Tx Team (Signed)
Interdisciplinary Treatment and Diagnostic Plan Update  06/29/2019 Time of Session: 9:00AM Julia [name] MRN: [id number]  Principal Diagnosis: MDD (major depressive disorder), recurrent, severe, with psychosis ([place])  Secondary Diagnoses: Principal Problem:   MDD (major depressive disorder), recurrent, severe, with psychosis ([place]) Active Problems:   Recurrent UTI (urinary tract infection) postcoital   Polysubstance abuse ([place])   Current Medications:  Current Facility-Administered Medications  Medication Dose Route Frequency Provider Last Rate Last Admin  . acetaminophen (TYLENOL) tablet 650 mg  650 mg Oral Q6H PRN Money, Lowry Ram, FNP   650 mg at 06/26/19 0810  . alum & mag hydroxide-simeth (MAALOX/MYLANTA) 200-200-20 MG/5ML suspension 30 mL  30 mL Oral Q4H PRN Money, Darnelle Maffucci B, FNP      . diphenhydrAMINE (BENADRYL) capsule 50 mg  50 mg Oral Q6H PRN Money, Lowry Ram, FNP   50 mg at 06/26/19 2015   Or  . diphenhydrAMINE (BENADRYL) injection 50 mg  50 mg Intramuscular Q6H PRN Money, Lowry Ram, FNP      . FLUoxetine (PROZAC) capsule 30 mg  30 mg Oral Daily Sharma Covert, MD   30 mg at 06/29/19 [id number]  . haloperidol (HALDOL) tablet 5 mg  5 mg Oral Q6H PRN Money, Lowry Ram, FNP   5 mg at 06/26/19 2015   Or  . haloperidol lactate (HALDOL) injection 5 mg  5 mg Intramuscular Q6H PRN Money, Lowry Ram, FNP      . hydrOXYzine (ATARAX/VISTARIL) tablet 25 mg  25 mg Oral Q6H PRN [name], [name], MD   25 mg at 06/28/19 2109  . ibuprofen (ADVIL) tablet 600 mg  600 mg Oral Q6H PRN Sharma Covert, MD   600 mg at 06/28/19 1543  . LORazepam (ATIVAN) tablet 2 mg  2 mg Oral Q6H PRN Money, Lowry Ram, FNP   2 mg at 06/26/19 2014   Or  . LORazepam (ATIVAN) injection 2 mg  2 mg Intramuscular Q6H PRN Money, Lowry Ram, FNP   2 mg at 06/25/19 1229  . magnesium hydroxide (MILK OF MAGNESIA) suspension 30 mL  30 mL Oral Daily PRN Money, Darnelle Maffucci B, FNP   30 mL at 06/25/19 0934  . nicotine (NICODERM CQ - dosed in mg/24  hours) patch 21 mg  21 mg Transdermal Daily Money, Darnelle Maffucci B, FNP   21 mg at 06/27/19 1050  . QUEtiapine (SEROQUEL) tablet 400 mg  400 mg Oral QHS Sharma Covert, MD   400 mg at 06/28/19 2109  . QUEtiapine (SEROQUEL) tablet 50 mg  50 mg Oral Daily Sharma Covert, MD   50 mg at 06/29/19 [id number]  . traZODone (DESYREL) tablet 100 mg  100 mg Oral QHS PRN Sharma Covert, MD   100 mg at 06/28/19 2108   PTA Medications: Medications Prior to Admission  Medication Sig Dispense Refill Last Dose  . etonogestrel (NEXPLANON) 68 MG IMPL implant 1 each (68 mg total) by Subdermal route once for 1 dose. (Birth control method) 1 each 0   . FLUoxetine (PROZAC) 20 MG capsule Take 1 capsule (20 mg total) by mouth daily. 30 capsule 1   . hydrOXYzine (ATARAX/VISTARIL) 25 MG tablet Take 1 tablet (25 mg total) by mouth 3 (three) times daily as needed for anxiety. (Patient not taking: Reported on 05/15/2019) 30 tablet 0   . ibuprofen (ADVIL) 600 MG tablet Take 1 tablet (600 mg total) by mouth every 6 (six) hours as needed. (Patient not taking: Reported on 05/15/2019) 30 tablet 0   .  lamoTRIgine (LAMICTAL) 25 MG tablet Take 2 tablets (50 mg total) by mouth 2 (two) times daily. 60 tablet 0   . nicotine polacrilex (NICORETTE) 2 MG gum Take 1 each (2 mg total) by mouth as needed for smoking cessation. (Patient not taking: Reported on 05/15/2019) 100 tablet 0   . QUEtiapine (SEROQUEL) 300 MG tablet Take 1 tablet (300 mg total) by mouth at bedtime. 30 tablet 2     Patient Stressors: Marital or family conflict Medication change or noncompliance Other: no job  Patient Strengths: Ability for insight Average or above average intelligence Communication skills Supportive family/friends  Treatment Modalities: Medication Management, Group therapy, Case management,  1 to 1 session with clinician, Psychoeducation, Recreational therapy.   Physician Treatment Plan for Primary Diagnosis: MDD (major depressive disorder),  recurrent, severe, with psychosis ([place]) Long Term Goal(s): Improvement in symptoms so as ready for discharge Improvement in symptoms so as ready for discharge   Short Term Goals: Ability to verbalize feelings will improve Ability to disclose and discuss suicidal ideas Ability to demonstrate self-control will improve Ability to identify and develop effective coping behaviors will improve Compliance with prescribed medications will improve  Medication Management: Evaluate patient's response, side effects, and tolerance of medication regimen.  Therapeutic Interventions: 1 to 1 sessions, Unit Group sessions and Medication administration.  Evaluation of Outcomes: Adequate for Discharge  Physician Treatment Plan for Secondary Diagnosis: Principal Problem:   MDD (major depressive disorder), recurrent, severe, with psychosis ([place]) Active Problems:   Recurrent UTI (urinary tract infection) postcoital   Polysubstance abuse ([place])  Long Term Goal(s): Improvement in symptoms so as ready for discharge Improvement in symptoms so as ready for discharge   Short Term Goals: Ability to verbalize feelings will improve Ability to disclose and discuss suicidal ideas Ability to demonstrate self-control will improve Ability to identify and develop effective coping behaviors will improve Compliance with prescribed medications will improve     Medication Management: Evaluate patient's response, side effects, and tolerance of medication regimen.  Therapeutic Interventions: 1 to 1 sessions, Unit Group sessions and Medication administration.  Evaluation of Outcomes: Adequate for Discharge   RN Treatment Plan for Primary Diagnosis: MDD (major depressive disorder), recurrent, severe, with psychosis ([place]) Long Term Goal(s): Knowledge of disease and therapeutic regimen to maintain health will improve  Short Term Goals: Ability to demonstrate self-control, Ability to verbalize feelings will improve and  Ability to identify and develop effective coping behaviors will improve  Medication Management: RN will administer medications as ordered by provider, will assess and evaluate patient's response and provide education to patient for prescribed medication. RN will report any adverse and/or side effects to prescribing provider.  Therapeutic Interventions: 1 on 1 counseling sessions, Psychoeducation, Medication administration, Evaluate responses to treatment, Monitor vital signs and CBGs as ordered, Perform/monitor CIWA, COWS, AIMS and Fall Risk screenings as ordered, Perform wound care treatments as ordered.  Evaluation of Outcomes: Adequate for Discharge   LCSW Treatment Plan for Primary Diagnosis: MDD (major depressive disorder), recurrent, severe, with psychosis ([place]) Long Term Goal(s): Safe transition to appropriate next level of care at discharge, Engage patient in therapeutic group addressing interpersonal concerns.  Short Term Goals: Engage patient in aftercare planning with referrals and resources, Increase social support, Increase ability to appropriately verbalize feelings, Increase emotional regulation and Increase skills for wellness and recovery  Therapeutic Interventions: Assess for all discharge needs, 1 to 1 time with Social worker, Explore available resources and support systems, Assess for adequacy in community support network,  Educate family and significant other(s) on suicide prevention, Complete Psychosocial Assessment, Interpersonal group therapy.  Evaluation of Outcomes: Adequate for Discharge   Progress in Treatment: Attending groups: No. Participating in groups: No. Taking medication as prescribed: Yes. Toleration medication: Yes. Family/Significant other contact made: Yes, individual(s) contacted:  pts father Patient understands diagnosis: Yes. Discussing patient identified problems/goals with staff: Yes. Medical problems stabilized or resolved: Yes. Denies  suicidal/homicidal ideation: Yes. Issues/concerns per patient self-inventory: No. Other: none  New problem(s) identified: No, Describe:  none  New Short Term/Long Term Goal(s): elimination of symptoms of psychosis, medication management for mood stabilization; elimination of SI thoughts; development of comprehensive mental wellness/sobriety plan.  Patient Goals:  "treatment, get on medicine"  Discharge Plan or Barriers: Patient reports she is homeless and will ned assistance identifying alternative living arrangements. Aftercare plans need to be assessed. Update 06/29/19: SPE pamphlet, Mobile Crisis information, and AA/NA information provided to patient for additional community support and resources. Pt is discharging to [facility] and will follow up with [facility].  Reason for Continuation of Hospitalization: non  Estimated Length of Stay:  Today 06/29/2019  Attendees: Patient:  06/29/2019 10:13 AM  Physician: Dr. Weber Cooks, MD 06/29/2019 10:13 AM  Nursing:  06/29/2019 10:13 AM  RN Care Manager: 06/29/2019 10:13 AM  Social Worker: Evalina Field, LCSW 06/29/2019 10:13 AM  Recreational Therapist:  06/29/2019 10:13 AM  Other:  06/29/2019 10:13 AM  Other:  06/29/2019 10:13 AM  Other: 06/29/2019 10:13 AM    Scribe for Treatment Team: Mariann Laster Findlay Dagher, LCSW 06/29/2019 10:13 AM

## 2019-06-29 NOTE — Plan of Care (Signed)
  Problem: Group Participation Goal: STG - Patient will engage in groups without prompting or encouragement from LRT x3 group sessions within 5 recreation therapy group sessions Description: STG - Patient will engage in groups without prompting or encouragement from LRT x3 group sessions within 5 recreation therapy group sessions 06/29/2019 1112 by Ernest Haber, LRT Outcome: Adequate for Discharge 06/29/2019 1112 by Ernest Haber, LRT Outcome: Adequate for Discharge

## 2019-06-29 NOTE — Progress Notes (Signed)
Recreation Therapy Notes  Date: 06/29/2019  Time: 9:30 am    Recreation Therapy group canceled due to COVID-19.     Keishawn Darsey LRT/CTRS        Zailen Albarran 06/29/2019 10:57 AM

## 2019-06-29 NOTE — Discharge Summary (Signed)
Physician Discharge Summary Note  Patient:  Julia [name] is an 18 y.o., female MRN:  [id number] DOB:  2001/05/26 Patient phone:  [phone] (home)  Patient address:   [address] [place] [address],  Total Time spent with patient: 30 minutes  Date of Admission:  06/23/2019 Date of Discharge: June 29, 2019  Reason for Admission: Patient was admitted to [place] in transfer from outside hospital because of presentation with mood lability and instability disorganized thinking suicidal thoughts agitation  Principal Problem: MDD (major depressive disorder), recurrent, severe, with psychosis ([place]) Discharge Diagnoses: Principal Problem:   MDD (major depressive disorder), recurrent, severe, with psychosis ([place]) Active Problems:   Recurrent UTI (urinary tract infection) postcoital   Polysubstance abuse ([place])   Past Psychiatric History: Patient has a history of substance abuse and mood instability with possible bipolar and borderline features.  Past Medical History:  Past Medical History:  Diagnosis Date  . Burning with urination 05/03/2015  . Contraceptive management 05/03/2015  . Depression   . Dysmenorrhea 12/30/2013  . Heroin addiction ([place])   . Menorrhagia 12/30/2013  . Menstrual extraction 12/30/2013  . Migraines   . Social anxiety disorder 09/25/2016  . Vaginal odor 05/03/2015    Past Surgical History:  Procedure Laterality Date  . NO PAST SURGERIES     Family History:  Family History  Problem Relation Age of Onset  . Depression Mother   . Hypertension Father   . Hyperlipidemia Father   . Cancer Paternal Grandmother        breast, uterine  . Cirrhosis Paternal Grandfather        due to alcohol   Family Psychiatric  History: See previous Social History:  Social History   Substance and Sexual Activity  Alcohol Use Yes   Comment: BAC was clear     Social History   Substance and Sexual Activity  Drug Use Not Currently  . Types: Marijuana, Cocaine, Oxycodone    Comment: opitates, xanax, canned air -- UDS was negative    Social History   Socioeconomic History  . Marital status: Single    Spouse name: Not on file  . Number of children: Not on file  . Years of education: Not on file  . Highest education level: Not on file  Occupational History  . Occupation: Unemployed  Tobacco Use  . Smoking status: Current Every Day Smoker    Packs/day: 0.25    Types: Cigarettes  . Smokeless tobacco: Never Used  . Tobacco comment: parents smoke   Substance and Sexual Activity  . Alcohol use: Yes    Comment: BAC was clear  . Drug use: Not Currently    Types: Marijuana, Cocaine, Oxycodone    Comment: opitates, xanax, canned air -- UDS was negative  . Sexual activity: Yes    Birth control/protection: Implant  Other Topics Concern  . Not on file  Social History Narrative   06/23/2019:  Pt stated that she is homeless, that she is a high school graduate, and that she is unemployed and not followed by any outpatient provider.      Lives with Dad. Mom has LGD, lives in [place]. 11th grader. Dog.   Social Determinants of Health   Financial Resource Strain:   . Difficulty of Paying Living Expenses: Not on file  Food Insecurity:   . Worried About Charity fundraiser in the Last Year: Not on file  . Ran Out of Food in the Last Year: Not on file  Transportation  Needs:   . Lack of Transportation (Medical): Not on file  . Lack of Transportation (Non-Medical): Not on file  Physical Activity:   . Days of Exercise per Week: Not on file  . Minutes of Exercise per Session: Not on file  Stress:   . Feeling of Stress : Not on file  Social Connections:   . Frequency of Communication with Friends and Family: Not on file  . Frequency of Social Gatherings with Friends and Family: Not on file  . Attends Religious Services: Not on file  . Active Member of Clubs or Organizations: Not on file  . Attends Archivist Meetings: Not on file  . Marital Status: Not  on file    Hospital Course: This is an updated discharge summary.  Patient had been planned for discharge yesterday but that discharge had to be canceled because of a lack of place for her to stay.  This 18 year old woman with a history of mood instability substance abuse and severe social problems showed reasonable improvement in the hospital.  She first came into the hospital very emotionally labile and agitated at times so emotionally overwhelmed it was difficult to communicate with her.  She was however compliant with medicine and participated appropriately in therapy and showed improvement.  She did not attempt to harm herself or harm anyone else during her time in the hospital.  Patient has developed no new medical problems here.  She has tested negative for COVID-19.  Plan had been for discharge yesterday but the plan fell through when her father refused to allow her to come home.  Subsequently social work may contact with the [facility] who agreed to accept her if we could provide appropriate photograph and paperwork which we have done.  Therefore we are planning on resuming the discharge procedure today with a plan [facility].  Prescriptions all completed.  Spoke with patient this morning and reviewed the plan with her.  She was calm and appropriate did not appear to be psychotic or agitated and agreed to the plan.  Physical Findings: AIMS: Facial and Oral Movements Muscles of Facial Expression: None, normal Lips and Perioral Area: None, normal Jaw: None, normal Tongue: None, normal,Extremity Movements Upper (arms, wrists, hands, fingers): None, normal Lower (legs, knees, ankles, toes): None, normal, Trunk Movements Neck, shoulders, hips: None, normal, Overall Severity Severity of abnormal movements (highest score from questions above): None, normal Incapacitation due to abnormal movements: None, normal Patient's awareness of abnormal movements  (rate only patient's report): No Awareness, Dental Status Current problems with teeth and/or dentures?: No Does patient usually wear dentures?: No  CIWA:    COWS:     Musculoskeletal: Strength & Muscle Tone: within normal limits Gait & Station: normal Patient leans: N/A  Psychiatric Specialty Exam: Physical Exam  Nursing note and vitals reviewed. Constitutional: She appears well-developed and well-nourished.  HENT:  Head: Normocephalic and atraumatic.  Eyes: Pupils are equal, round, and reactive to light. Conjunctivae are normal.  Cardiovascular: Regular rhythm and normal heart sounds.  Respiratory: Effort normal.  GI: Soft.  Musculoskeletal:        General: Normal range of motion.     Cervical back: Normal range of motion.  Neurological: She is alert.  Skin: Skin is warm and dry.  Psychiatric: She has a normal mood and affect. Her behavior is normal. Judgment and thought content normal.    Review of Systems  Constitutional: Negative.   HENT: Negative.  Eyes: Negative.   Respiratory: Negative.   Cardiovascular: Negative.   Gastrointestinal: Negative.   Musculoskeletal: Negative.   Skin: Negative.   Neurological: Negative.   Psychiatric/Behavioral: Negative.     Blood pressure 95/63, pulse 95, temperature 99 F (37.2 C), temperature source Oral, resp. rate 18, height 5\' 3"  (1.6 m), weight 65.8 kg, SpO2 97 %.Body mass index is 25.69 kg/m.  General Appearance: Casual  Eye Contact:  Good  Speech:  Clear and Coherent  Volume:  Normal  Mood:  Euthymic  Affect:  Congruent  Thought Process:  Coherent  Orientation:  Full (Time, Place, and Person)  Thought Content:  Logical  Suicidal Thoughts:  No  Homicidal Thoughts:  No  Memory:  Immediate;   Fair Recent;   Fair Remote;   Fair  Judgement:  Fair  Insight:  Fair  Psychomotor Activity:  Normal  Concentration:  Concentration: Fair  Recall:  [place] of Knowledge:  Fair  Language:  Fair  Akathisia:  No  Handed:   Right  AIMS (if indicated):     Assets:  Desire for Improvement Physical Health  ADL's:  Intact  Cognition:  WNL  Sleep:  Number of Hours: 7.75        Has this patient used any form of tobacco in the last 30 days? (Cigarettes, Smokeless Tobacco, Cigars, and/or Pipes) Yes, No  Blood Alcohol level:  Lab Results  Component Value Date   ETH <10 06/23/2019   ETH <10 99991111    Metabolic Disorder Labs:  Lab Results  Component Value Date   HGBA1C 5.2 05/17/2019   MPG 102.54 05/17/2019   MPG 108.28 02/17/2019   Lab Results  Component Value Date   PROLACTIN 31.9 (H) 02/17/2019   PROLACTIN 29.6 (H) 09/25/2016   Lab Results  Component Value Date   CHOL 144 05/17/2019   TRIG 65 05/17/2019   HDL 35 (L) 05/17/2019   CHOLHDL 4.1 05/17/2019   VLDL 13 05/17/2019   LDLCALC 96 05/17/2019   LDLCALC 76 02/17/2019    See Psychiatric Specialty Exam and Suicide Risk Assessment completed by Attending Physician prior to discharge.  Discharge destination:  Other:  [facility]ang[facility]  Is patient on multiple antipsychotic therapies at discharge:  No   Has Patient had three or more failed trials of antipsychotic monotherapy by history:  No  Recommended Plan for Multiple Antipsychotic Therapies: NA  Discharge Instructions    Diet - low sodium heart healthy   Complete by: As directed    Increase activity slowly   Complete by: As directed      Allergies as of 06/29/2019      Reactions   Abilify [aripiprazole]    Zoloft [sertraline Hcl]       Medication List    STOP taking these medications   ibuprofen 600 MG tablet Commonly known as: ADVIL   lamoTRIgine 25 MG tablet Commonly known as: LAMICTAL   nicotine polacrilex 2 MG gum Commonly known as: NICORETTE     TAKE these medications     Indication  etonogestrel 68 MG Impl implant Commonly known as: Nexplanon 1 each (68 mg total) by Subdermal route once for 1 dose. (Birth  control method)  Indication: Birth Control Treatment   FLUoxetine 10 MG capsule Commonly known as: PROZAC Take 3 capsules (30 mg total) by mouth daily. What changed:   medication strength  how much to take  Indication: Depression   hydrOXYzine 25 MG tablet  Commonly known as: ATARAX/VISTARIL Take 1 tablet (25 mg total) by mouth every 6 (six) hours as needed for anxiety. What changed: when to take this  Indication: Feeling Anxious   QUEtiapine 400 MG tablet Commonly known as: SEROQUEL Take 1 tablet (400 mg total) by mouth at bedtime. What changed:   medication strength  how much to take  Indication: Depressive Phase of Manic-Depression   QUEtiapine 50 MG tablet Commonly known as: SEROQUEL Take 1 tablet (50 mg total) by mouth daily. What changed: You were already taking a medication with the same name, and this prescription was added. Make sure you understand how and when to take each.  Indication: Depressive Phase of Manic-Depression      Follow-up [facility] Follow up.   Why: Please go Monday-Friday at 8:30AM to set up outpatient services. Please take photo i.d and hospital discharge paperwork with you. Thank You! Contact information: [address], [place] [address] Phone: [phone] Fax: [phone]          Follow-up recommendations:  Activity:  Activity as tolerated Diet:  Regular diet Other:  Follow-up in [facility] as noted with [facility] continue current medicine.  Do not resume using any drugs or alcohol  Comments: Prescriptions given at discharge  Signed: Alethia Berthold, MD 06/29/2019, 9:31 AM

## 2019-07-07 ENCOUNTER — Encounter (HOSPITAL_COMMUNITY): Payer: Self-pay | Admitting: Emergency Medicine

## 2019-07-07 ENCOUNTER — Other Ambulatory Visit: Payer: Self-pay

## 2019-07-07 ENCOUNTER — Emergency Department (HOSPITAL_COMMUNITY)
Admission: EM | Admit: 2019-07-07 | Discharge: 2019-07-08 | Disposition: A | Discharge status: Home / Self Care | Payer: Medicaid Other | Attending: Emergency Medicine | Admitting: Emergency Medicine

## 2019-07-07 DIAGNOSIS — R002 Palpitations: Secondary | ICD-10-CM | POA: Diagnosis present

## 2019-07-07 DIAGNOSIS — Z79899 Other long term (current) drug therapy: Secondary | ICD-10-CM | POA: Diagnosis not present

## 2019-07-07 DIAGNOSIS — Z20828 Contact with and (suspected) exposure to other viral communicable diseases: Secondary | ICD-10-CM | POA: Diagnosis not present

## 2019-07-07 DIAGNOSIS — F17210 Nicotine dependence, cigarettes, uncomplicated: Secondary | ICD-10-CM | POA: Diagnosis not present

## 2019-07-07 DIAGNOSIS — F419 Anxiety disorder, unspecified: Secondary | ICD-10-CM | POA: Diagnosis not present

## 2019-07-07 NOTE — ED Triage Notes (Signed)
Pt c/o anxiety and feeling like her heart is racing after "smoking some weed" tonight

## 2019-07-08 ENCOUNTER — Encounter (HOSPITAL_COMMUNITY): Payer: Self-pay | Admitting: *Deleted

## 2019-07-08 ENCOUNTER — Emergency Department (HOSPITAL_COMMUNITY)
Admission: EM | Admit: 2019-07-08 | Discharge: 2019-07-08 | Disposition: A | Discharge status: Home / Self Care | Payer: Medicaid Other | Source: Home / Self Care | Attending: Emergency Medicine | Admitting: Emergency Medicine

## 2019-07-08 DIAGNOSIS — F17210 Nicotine dependence, cigarettes, uncomplicated: Secondary | ICD-10-CM | POA: Insufficient documentation

## 2019-07-08 DIAGNOSIS — Z20828 Contact with and (suspected) exposure to other viral communicable diseases: Secondary | ICD-10-CM | POA: Insufficient documentation

## 2019-07-08 DIAGNOSIS — Z79899 Other long term (current) drug therapy: Secondary | ICD-10-CM | POA: Insufficient documentation

## 2019-07-08 DIAGNOSIS — F339 Major depressive disorder, recurrent, unspecified: Secondary | ICD-10-CM

## 2019-07-08 DIAGNOSIS — R45851 Suicidal ideations: Secondary | ICD-10-CM

## 2019-07-08 DIAGNOSIS — F332 Major depressive disorder, recurrent severe without psychotic features: Secondary | ICD-10-CM | POA: Insufficient documentation

## 2019-07-08 LAB — COMPREHENSIVE METABOLIC PANEL
ALT: 22 U/L (ref 0–44)
AST: 20 U/L (ref 15–41)
Albumin: 4.5 g/dL (ref 3.5–5.0)
Alkaline Phosphatase: 81 U/L (ref 38–126)
Anion gap: 11 (ref 5–15)
BUN: 18 mg/dL (ref 6–20)
CO2: 26 mmol/L (ref 22–32)
Calcium: 9.7 mg/dL (ref 8.9–10.3)
Chloride: 103 mmol/L (ref 98–111)
Creatinine, Ser: 0.84 mg/dL (ref 0.44–1.00)
GFR calc Af Amer: >60 mL/min (ref >60)
GFR calc non Af Amer: >60 mL/min (ref >60)
Glucose, Bld: 66 mg/dL — ABNORMAL LOW (ref 70–99)
Potassium: 3.8 mmol/L (ref 3.5–5.1)
Sodium: 140 mmol/L (ref 135–145)
Total Bilirubin: 0.4 mg/dL (ref 0.3–1.2)
Total Protein: 7.7 g/dL (ref 6.5–8.1)

## 2019-07-08 LAB — CBC WITH DIFFERENTIAL/PLATELET
Abs Immature Granulocytes: 0.02 10*3/uL (ref 0.00–0.07)
Basophils Absolute: 0 10*3/uL (ref 0.0–0.1)
Basophils Relative: 1 %
Eosinophils Absolute: 0.1 10*3/uL (ref 0.0–0.5)
Eosinophils Relative: 2 %
HCT: 45.1 % (ref 36.0–46.0)
Hemoglobin: 14.1 g/dL (ref 12.0–15.0)
Immature Granulocytes: 0 %
Lymphocytes Relative: 39 %
Lymphs Abs: 2.4 10*3/uL (ref 0.7–4.0)
MCH: 29.6 pg (ref 26.0–34.0)
MCHC: 31.3 g/dL (ref 30.0–36.0)
MCV: 94.7 fL (ref 80.0–100.0)
Monocytes Absolute: 0.4 10*3/uL (ref 0.1–1.0)
Monocytes Relative: 7 %
Neutro Abs: 3.1 10*3/uL (ref 1.7–7.7)
Neutrophils Relative %: 51 %
Platelets: 255 10*3/uL (ref 150–400)
RBC: 4.76 MIL/uL (ref 3.87–5.11)
RDW: 12.1 % (ref 11.5–15.5)
WBC: 6.1 10*3/uL (ref 4.0–10.5)
nRBC: 0 % (ref 0.0–0.2)

## 2019-07-08 LAB — ACETAMINOPHEN LEVEL: Acetaminophen (Tylenol), Serum: <10 ug/mL — ABNORMAL LOW (ref 10–30)

## 2019-07-08 LAB — SARS CORONAVIRUS 2 (TAT 6-24 HRS): SARS Coronavirus 2: NEGATIVE

## 2019-07-08 LAB — ETHANOL: Alcohol, Ethyl (B): <10 mg/dL (ref <10)

## 2019-07-08 MED ORDER — QUETIAPINE FUMARATE 100 MG PO TABS: 400.0000 mg | ORAL_TABLET | Freq: Every day | ORAL | Status: DC

## 2019-07-08 MED ORDER — QUETIAPINE FUMARATE 25 MG PO TABS
50.0000 mg | ORAL_TABLET | Freq: Every day | ORAL | Status: DC
  Administered 2019-07-08: 50 mg via ORAL
  Filled 2019-07-08: qty 2

## 2019-07-08 MED ORDER — NICOTINE 21 MG/24HR TD PT24
21.0000 mg | MEDICATED_PATCH | Freq: Every day | TRANSDERMAL | Status: DC
  Administered 2019-07-08: 21 mg via TRANSDERMAL
  Filled 2019-07-08: qty 1

## 2019-07-08 MED ORDER — ZOLPIDEM TARTRATE 5 MG PO TABS: 5.0000 mg | ORAL_TABLET | Freq: Every evening | ORAL | Status: DC | PRN

## 2019-07-08 MED ORDER — ACETAMINOPHEN 325 MG PO TABS: 650.0000 mg | ORAL_TABLET | ORAL | Status: DC | PRN

## 2019-07-08 MED ORDER — FLUOXETINE HCL 20 MG PO CAPS
30.0000 mg | ORAL_CAPSULE | Freq: Every day | ORAL | Status: DC
  Administered 2019-07-08: 30 mg via ORAL
  Filled 2019-07-08: qty 1

## 2019-07-08 MED ORDER — ONDANSETRON HCL 4 MG PO TABS: 4.0000 mg | ORAL_TABLET | Freq: Three times a day (TID) | ORAL | Status: DC | PRN

## 2019-07-08 MED ORDER — ALUM & MAG HYDROXIDE-SIMETH 200-200-20 MG/5ML PO SUSP: 30.0000 mL | Freq: Four times a day (QID) | ORAL | Status: DC | PRN

## 2019-07-08 NOTE — ED Notes (Signed)
Pt Cleared by psych. Provided report to oncoming [employer].

## 2019-07-08 NOTE — ED Notes (Signed)
TTS in progress 

## 2019-07-08 NOTE — B[facility] Counselor (Signed)
Disposition: Per Priscille Loveless, PMHNP patient does not meet in patient criteria and is psych cleared.

## 2019-07-08 NOTE — Discharge Instructions (Addendum)
Follow up with specialist for further management of your depression.  Return if you have any concerns.

## 2019-07-08 NOTE — ED Triage Notes (Signed)
Stressed, denies suicidal thoughts, denies homicidal thoughts

## 2019-07-08 NOTE — ED Notes (Signed)
Contacted Father, Shalitha Bellus. States " I cant come and get her backed im in bed with my back pain. She needs to go to a homeless shelter. She cant come here, she hears voices"  RN asked if I could contact another person and he states " there is no one"

## 2019-07-08 NOTE — ED Notes (Signed)
Provided patient with lunch tray. 

## 2019-07-08 NOTE — ED Notes (Signed)
Pt wanded by security after changing into psych scrubs. Lab at bedside.

## 2019-07-08 NOTE — ED Notes (Signed)
[facility] called, pt is psych cleared.

## 2019-07-08 NOTE — ED Notes (Signed)
[place] in triage, wanded by security staff

## 2019-07-08 NOTE — ED Notes (Signed)
Pt changed into psych scrubs.

## 2019-07-08 NOTE — ED Provider Notes (Signed)
[facility] EMERGENCY DEPARTMENT Provider Note   CSN: [id number] Arrival date & time: 07/08/19  1129     History Chief Complaint  Patient presents with  . Panic Attack    Julia [name] is a 18 y.o. female.  The history is provided by the patient and medical records. No language interpreter was used.       18 year old female with history of heroin addiction, depression, social anxiety disorder brought here escorted by the police for suicidal ideation.  Patient report she is feeling increasingly depressed after argument with a family member recently.  States that they would not accept her and does not want her to come back home.  States she has been homeless for the past year.  She mention she is feeling depressed with suicidal ideation but without any specific plan.  She mention she cuts her self in the past as well as trying to overdose on medication in the past.  She denies any recent self-harm.  She denies any homicidal ideation auditory or visual hallucination.  No recent change in medication, and states she is compliant.  She admits to alcohol consumption a few days ago as well as using marijuana yesterday.  She reported having difficulty sleeping but eating is about the same.  She was seen earlier today for anxiety, subsequently discharged but then returned with suicidal ideation.  She has a Nexplanon, unable to recall last menstruation.  She denies any active pain.  She denies any cold symptoms or any recent sick contact.  Past Medical History:  Diagnosis Date  . Burning with urination 05/03/2015  . Contraceptive management 05/03/2015  . Depression   . Dysmenorrhea 12/30/2013  . Heroin addiction ([place])   . Menorrhagia 12/30/2013  . Menstrual extraction 12/30/2013  . Migraines   . Social anxiety disorder 09/25/2016  . Vaginal odor 05/03/2015    Patient Active Problem List   Diagnosis Date Noted  . Mood disorder with psychosis ([place]) 05/16/2019  . Cocaine dependence with  cocaine-induced psychotic disorder with complication ([place])   . Psychosis ([place]) 05/15/2019  . MDD (major depressive disorder), recurrent, severe, with psychosis ([place]) 02/16/2019  . Bipolar 1 disorder, depressed, severe ([place]) 01/06/2018  . Polysubstance dependence including opioid type drug, continuous use ([place]) 01/06/2018  . Petechiae 10/09/2017  . Social anxiety disorder 09/25/2016  . MDD (major depressive disorder), recurrent episode ([place]) 09/24/2016  . Substance induced mood disorder ([place]) 04/29/2016  . Depressed mood 04/17/2016  . Polysubstance abuse ([place])   . Recurrent UTI (urinary tract infection) postcoital 08/21/2015  . Dysmenorrhea 12/30/2013    Past Surgical History:  Procedure Laterality Date  . NO PAST SURGERIES       OB History    Gravida  0   Para      Term      Preterm      AB      Living        SAB      TAB      Ectopic      Multiple      Live Births              Family History  Problem Relation Age of Onset  . Depression Mother   . Hypertension Father   . Hyperlipidemia Father   . Cancer Paternal Grandmother        breast, uterine  . Cirrhosis Paternal Grandfather        due to alcohol    Social History  Tobacco Use  . Smoking status: Current Every Day Smoker    Packs/day: 0.25    Types: Cigarettes  . Smokeless tobacco: Never Used  . Tobacco comment: parents smoke   Substance Use Topics  . Alcohol use: Yes    Comment: BAC was clear  . Drug use: Not Currently    Types: Marijuana, Cocaine, Oxycodone    Comment: opitates, xanax, canned air -- UDS was negative    Home Medications Prior to Admission medications   Medication Sig Start Date End Date Taking? Authorizing Provider  etonogestrel (NEXPLANON) 68 MG IMPL implant 1 each (68 mg total) by Subdermal route once for 1 dose. (Birth control method) 01/08/18 05/15/19  Lindell Spar I, NP  FLUoxetine (PROZAC) 10 MG capsule Take 3 capsules (30 mg total) by mouth daily. 06/29/19    [name], [name], MD  hydrOXYzine (ATARAX/VISTARIL) 25 MG tablet Take 1 tablet (25 mg total) by mouth every 6 (six) hours as needed for anxiety. 06/29/19   [name], [name], MD  QUEtiapine (SEROQUEL) 400 MG tablet Take 1 tablet (400 mg total) by mouth at bedtime. 06/29/19   [name], [name], MD  QUEtiapine (SEROQUEL) 50 MG tablet Take 1 tablet (50 mg total) by mouth daily. 06/29/19   [name], [name], MD    Allergies    Abilify [aripiprazole] and Zoloft [sertraline hcl]  Review of Systems   Review of Systems  All other systems reviewed and are negative.   Physical Exam Updated Vital Signs BP 102/76 (BP Location: Left Arm)   Pulse (!) 109   Temp 97.8 F (36.6 C) (Oral)   Resp 18   Ht 5\' 3"  (1.6 m)   Wt 61.2 kg   SpO2 98%   BMI 23.91 kg/m   Physical Exam Vitals and nursing note reviewed.  Constitutional:      General: She is not in acute distress.    Appearance: She is well-developed.  HENT:     Head: Atraumatic.  Eyes:     Conjunctiva/sclera: Conjunctivae normal.  Cardiovascular:     Rate and Rhythm: Normal rate and regular rhythm.     Pulses: Normal pulses.     Heart sounds: Normal heart sounds.  Pulmonary:     Breath sounds: Normal breath sounds.  Abdominal:     Palpations: Abdomen is soft.  Musculoskeletal:     Cervical back: Neck supple.  Skin:    Findings: No rash.  Neurological:     Mental Status: She is alert and oriented to person, place, and time.     GCS: GCS eye subscore is 4. GCS verbal subscore is 5. GCS motor subscore is 6.  Psychiatric:        Mood and Affect: Mood is depressed. Affect is tearful.        Speech: Speech is delayed.        Behavior: Behavior is withdrawn.        Thought Content: Thought content is not paranoid. Thought content includes suicidal ideation. Thought content does not include homicidal ideation.     Comments: Poor eye contact, is tearful.     ED Results / Procedures / Treatments   Labs (all labs ordered are listed, but  only abnormal results are displayed) Labs Reviewed  ACETAMINOPHEN LEVEL - Abnormal; Notable for the following components:      Result Value   Acetaminophen (Tylenol), Serum <10 (*)    All other components within normal limits  COMPREHENSIVE METABOLIC PANEL - Abnormal; Notable for the  following components:   Glucose, Bld 66 (*)    All other components within normal limits  SARS CORONAVIRUS 2 (TAT 6-24 HRS)  ETHANOL  CBC WITH DIFFERENTIAL/PLATELET  PREGNANCY, URINE  RAPID URINE DRUG SCREEN, HOSP PERFORMED    EKG None  Radiology No results found.  Procedures Procedures (including critical care time)  Medications Ordered in ED Medications  acetaminophen (TYLENOL) tablet 650 mg (has no administration in time range)  zolpidem (AMBIEN) tablet 5 mg (has no administration in time range)  ondansetron (ZOFRAN) tablet 4 mg (has no administration in time range)  alum & mag hydroxide-simeth (MAALOX/MYLANTA) 200-200-20 MG/5ML suspension 30 mL (has no administration in time range)  nicotine (NICODERM CQ - dosed in mg/24 hours) patch 21 mg (has no administration in time range)  FLUoxetine (PROZAC) capsule 30 mg (has no administration in time range)  QUEtiapine (SEROQUEL) tablet 400 mg (has no administration in time range)  QUEtiapine (SEROQUEL) tablet 50 mg (has no administration in time range)    ED Course  I have reviewed the triage vital signs and the nursing notes.  Pertinent labs & imaging results that were available during my care of the patient were reviewed by me and considered in my medical decision making (see chart for details).    MDM Rules/Calculators/A&P                      BP 102/76 (BP Location: Left Arm)   Pulse (!) 109   Temp 97.8 F (36.6 C) (Oral)   Resp 18   Ht 5\' 3"  (1.6 m)   Wt 61.2 kg   SpO2 98%   BMI 23.91 kg/m   Final Clinical Impression(s) / ED Diagnoses Final diagnoses:  Suicidal ideation  Recurrent major depressive disorder, remission status  unspecified ([place])    Rx / DC Orders ED Discharge Orders    None     11:55 AM Patient with history of polysubstance use as well as depression here with suicidal ideation and anxiety brought on by argument with a family member recently.  She is without any signs of physical injury.  She has vague suicidal ideation without specific plan.  No AVH.  Will perform medical screening and will consult TTS for further psychiatric management once patient is medically cleared.  1:27 PM Patient is medically cleared and can be assessed further by psychiatry. covid-19 test ordered.  Julia [name] was evaluated in Emergency Department on 07/08/2019 for the symptoms described in the history of present illness. She was evaluated in the context of the global COVID-19 pandemic, which necessitated consideration that the patient might be at risk for infection with the SARS-CoV-2 virus that causes COVID-19. Institutional protocols and algorithms that pertain to the evaluation of patients at risk for COVID-19 are in a state of rapid change based on information released by regulatory bodies including the CDC and federal and state organizations. These policies and algorithms were followed during the patient's care in the ED.  3:23 PM Patient has been seen and evaluated by psychiatry and is psychiatrically cleared.  She will be discharged home.  Resource provided.    Domenic Moras, PA-C 07/08/19 1523    Noemi Chapel, MD 07/08/19 [phone]

## 2019-07-08 NOTE — ED Notes (Signed)
Pt belongings locked in psych lockers.

## 2019-07-08 NOTE — ED Notes (Signed)
Pt reports SI but will not give specifics.

## 2019-07-08 NOTE — ED Provider Notes (Signed)
[facility] EMERGENCY DEPARTMENT Provider Note   CSN: [id number] Arrival date & time: 07/07/19  2146     History Chief Complaint  Patient presents with  . Anxiety    Julia [name] is a 18 y.o. female.  The history is provided by the patient.  Palpitations Palpitations quality:  Regular Timing:  Constant Progression:  Improving Chronicity:  New Context: anxiety and illicit drugs   Context comment:  Smoking marijuana Relieved by:  None tried Worsened by:  Nothing Associated symptoms: no chest pain and no shortness of breath   Patient presents for anxiety and palpitations after smoking marijuana earlier.  She denies any chest pain or shortness of breath.  No fevers or vomiting.  She has no other acute complaints     Past Medical History:  Diagnosis Date  . Burning with urination 05/03/2015  . Contraceptive management 05/03/2015  . Depression   . Dysmenorrhea 12/30/2013  . Heroin addiction ([place])   . Menorrhagia 12/30/2013  . Menstrual extraction 12/30/2013  . Migraines   . Social anxiety disorder 09/25/2016  . Vaginal odor 05/03/2015    Patient Active Problem List   Diagnosis Date Noted  . Mood disorder with psychosis ([place]) 05/16/2019  . Cocaine dependence with cocaine-induced psychotic disorder with complication ([place])   . Psychosis ([place]) 05/15/2019  . MDD (major depressive disorder), recurrent, severe, with psychosis ([place]) 02/16/2019  . Bipolar 1 disorder, depressed, severe ([place]) 01/06/2018  . Polysubstance dependence including opioid type drug, continuous use ([place]) 01/06/2018  . Petechiae 10/09/2017  . Social anxiety disorder 09/25/2016  . MDD (major depressive disorder), recurrent episode ([place]) 09/24/2016  . Substance induced mood disorder ([place]) 04/29/2016  . Depressed mood 04/17/2016  . Polysubstance abuse ([place])   . Recurrent UTI (urinary tract infection) postcoital 08/21/2015  . Dysmenorrhea 12/30/2013    Past Surgical History:  Procedure Laterality Date    . NO PAST SURGERIES       OB History    Gravida  0   Para      Term      Preterm      AB      Living        SAB      TAB      Ectopic      Multiple      Live Births              Family History  Problem Relation Age of Onset  . Depression Mother   . Hypertension Father   . Hyperlipidemia Father   . Cancer Paternal Grandmother        breast, uterine  . Cirrhosis Paternal Grandfather        due to alcohol    Social History   Tobacco Use  . Smoking status: Current Every Day Smoker    Packs/day: 0.25    Types: Cigarettes  . Smokeless tobacco: Never Used  . Tobacco comment: parents smoke   Substance Use Topics  . Alcohol use: Yes    Comment: BAC was clear  . Drug use: Not Currently    Types: Marijuana, Cocaine, Oxycodone    Comment: opitates, xanax, canned air -- UDS was negative    Home Medications Prior to Admission medications   Medication Sig Start Date End Date Taking? Authorizing Provider  etonogestrel (NEXPLANON) 68 MG IMPL implant 1 each (68 mg total) by Subdermal route once for 1 dose. (Birth control method) 01/08/18 05/15/19  Encarnacion Slates, NP  FLUoxetine (PROZAC)  10 MG capsule Take 3 capsules (30 mg total) by mouth daily. 06/29/19   [name], [name], MD  hydrOXYzine (ATARAX/VISTARIL) 25 MG tablet Take 1 tablet (25 mg total) by mouth every 6 (six) hours as needed for anxiety. 06/29/19   [name], [name], MD  QUEtiapine (SEROQUEL) 400 MG tablet Take 1 tablet (400 mg total) by mouth at bedtime. 06/29/19   [name], [name], MD  QUEtiapine (SEROQUEL) 50 MG tablet Take 1 tablet (50 mg total) by mouth daily. 06/29/19   [name], [name], MD    Allergies    Abilify [aripiprazole] and Zoloft [sertraline hcl]  Review of Systems   Review of Systems  Constitutional: Negative for fever.  Respiratory: Negative for shortness of breath.   Cardiovascular: Positive for palpitations. Negative for chest pain.  Psychiatric/Behavioral: The patient is  nervous/anxious.   All other systems reviewed and are negative.   Physical Exam Updated Vital Signs BP 110/68   Pulse 74   Temp 98.1 F (36.7 C) (Oral)   Resp 16   Ht 1.6 m (5\' 3" )   Wt 61.2 kg   SpO2 98%   BMI 23.91 kg/m   Physical Exam CONSTITUTIONAL: Sleeping on arrival to the room, no acute distress HEAD: Normocephalic/atraumatic EYES: EOMI ENMT: Mucous membranes moist NECK: supple no meningeal signs CV: S1/S2 noted, no murmurs/rubs/gallops noted LUNGS: Lungs are clear to auscultation bilaterally, no apparent distress ABDOMEN: soft, nontender NEURO: Pt is sleeping on arrival to room.  Easily arousable, follows all commands.  No focal weakness noted, moves all extremities x4 EXTREMITIES: pulses normal/equal, full ROM SKIN: warm, color normal PSYCH: Flat affect  ED Results / Procedures / Treatments   Labs (all labs ordered are listed, but only abnormal results are displayed) Labs Reviewed - No data to display  EKG EKG Interpretation  Date/Time:  Wednesday July 07 2019 23:40:01 EST Ventricular Rate:  76 PR Interval:    QRS Duration: 97 QT Interval:  386 QTC Calculation: 434 R Axis:   51 Text Interpretation: Sinus rhythm Baseline wander in lead(s) V1 No significant change since last tracing Confirmed by Ripley Fraise [phone]) on 07/07/2019 11:45:30 PM   Radiology No results found.  Procedures Procedures    Medications Ordered in ED Medications - No data to display  ED Course  I have reviewed the triage vital signs and the nursing notes.     MDM Rules/Calculators/A&P                      She presents for anxiety and palpitations are likely induced by illicit drug use.  Her EKG is unremarkable.  She is very comfortable when I examined her in no acute distress.  Vitals are appropriate.  Since patient was improving, I felt she was stable and safe for discharge home.  When I informed her of discharge planning, patient reports "oh by the way I am  suicidal " Patient made no mention of this to any other staff member until time of discharge Patient has a long history of mental illness, and was just recently admitted to behavioral health Hospital At this point she is awake alert, she voices no plan to harm her self. At this time I feel she is safe for outpatient management. Patient reports she can get a ride home. Final Clinical Impression(s) / ED Diagnoses Final diagnoses:  Palpitations  Anxiety    Rx / DC Orders ED Discharge Orders    None  Ripley Fraise, MD 07/08/19 0120

## 2019-07-08 NOTE — Discharge Instructions (Addendum)
Substance Abuse Treatment Programs ° °Intensive Outpatient Programs °[facility]     °[address]      °[place], [place]                   °[phone]      ° °[facility] °[address] °[place], [place] °[phone] ° °[facility]     °(Inpatient and outpatient)     °[address]           °[phone]   ° °[facility] °[phone] (Suboxone and Methadone) ° °[address]      °[place], [place] [address]      °[phone]      ° °[address] °[place], [place] °852-3033 ° °[facility] (Outpatient/Inpatient, Chemical)    °(insurance only) [phone]      °       °[facility] (Groups & Residential) °[place], [place] °[phone] ° °   °[facility]     °[address]     °[place], [place]      °[phone]      ° °[facility] (for caregivers and family) °[address] °[place], [place] °336-299-4655 ° ° ° ° ° °Residential Treatment Programs °[facility]      °[address], [place], [place] [address]  °[phone]      ° °[facility] °[address] [place], [place] [address] °[phone] ° °[facility]        °[phone]      ° °[facility] °[phone] ° °[facility]             °[address]                                         °[place], [place]                                                °[phone] or [phone]                              ° °[facility] [place] °[address] °[place], [address] °[phone] ° °[facility]    °[address]      °[place], [place]     °[phone]      ° °[facility] °[address] °[place], [place] °[phone] ° °[facility]   °[address]     °[place], [place] [address]     °[phone]      °Admissions: 8am-3pm M-F ° °[facility] °[address] °[place],  [place] °[phone] ° °[facility]: Residential Program (90 Days)   °[place], [place]      °[phone] or [phone]    ° °[facility] °[place],  °(Walk in Hours over the weekend or by referral) ° °[facility] °[address], [place],  [address] °([phone] ° °Crisis Mobile: [facility]:  1-877-626-1772 (for crisis response 24 hours a day) °[facility] Hotline:      1-800-256-2452 °Outpatient Psychiatry and Counseling ° °[facility]: Mobile Crisis   Management 24 hours:  1-877-626-1772 ° °[facility] sliding scale fee and walk in schedule: M-F 8am-12pm/1pm-3pm °[address]  °[place], [place] [address] °[phone] ° °[facility] °[address] °[place], [place] [address] °[phone] ° °[facility] (Former[facility] known [facility])- new patient walk-in appointments available Monday - Friday 8am -3pm.          °[address] °[place], [place] [address] °336-676-6840 or crisis line- 336-676-6905 ° °[facility] Intensive Outpatient Therapy Program °[address] °[place], [place] [address] °336-832-9804 ° °[facility]                  °Crisis Services      °[phone]      °[address]     °[place], [place] [address]                ° °[facility]   °[facility] °[phone] °[address] °[place], [place] [address] ° ° °[facility]          °[address]  °[place], [place] [address]       °[phone] ° °[facility] °[address] °[place], [place] [address] °336-292-1510 ° °[facility]    °[address]    °[place], [place] [address]     °[phone]      ° °[facility] McKin[facility], MD     °[address]     °[place] [address]     °[phone]     °  °[facility] °[address] °[place] [address] ° °[facility]     °[address]     °[place], [place]      °336-542-2076      ° °[facility] °Julia Ahluwalia, MD °[address] °[place], [place] [address] °[phone] ° °[facility]     °[address]     °[place], [place] [address]     °336-274-1237      ° °[facility] °[address] °[place], [place] [address] °336-854-4450 °Resources for Temporary Residential Assistance/Crisis Centers ° °DAY CENTERS °[facility] °M-F 8am-3pm   °[address] [place], [place] [address]   336-332-0824 °Services include: laundry, barbering, support groups, case management, phone  & computer access, showers, AA/NA mtgs, mental health/substance abuse nurse, job skills class, disability information, VA assistance, spiritual classes, etc.  ° °HOMELESS SHELTERS ° °[facility]     °[facility]   °[address], [place]     °[phone]       °       °[facility] (women and children)       °[address] °[place], [place] [address] °336-275-0820 °[email or web address] for application and process °Application Required ° °[facility]   °[address]    °[place] [address]     °[phone]       °             °[facility] °[address] °, [place] [address] °336.273.5572 °336-235-0363(schedule application appt.) °Application Required ° °[facility] (women only)    °[address]     °[place], [place] [address]     °336-884-1039      °  Intake starts 6pm daily °Need valid ID, SSC, & Police report °[facility] [place] °[address] °[place], [place] °336-881-5420 °Application Required ° °[facility] (men only)     °[address]      °[place], [place]     °[phone]      ° °[facility] °(Pregnant women only) °[address] °[place], [place] °336-275-0206 ° °[facility]      °[address]      °[place], [place] [address]     °[phone]      °       °[facility] °[address] °[place], [place] °336-723-1848 °90 day commitment/SA/Application process ° °[facility](men only)     °[address]     °[place], [place]     °[phone]       °Check-in at 7pm     °       °[facility] °[address] °[place], [place] [address] °336-248-6684 °Men/Women/Women and Children must be there by 7 pm ° °[facility] °[place], [place] °336-722-8721                ° °
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
## 2019-07-09 ENCOUNTER — Other Ambulatory Visit: Payer: Self-pay

## 2019-07-09 ENCOUNTER — Emergency Department (HOSPITAL_COMMUNITY)
Admission: EM | Admit: 2019-07-09 | Discharge: 2019-07-10 | Disposition: A | Discharge status: Other Acute Inpatient Hospital | Payer: Medicaid Other | Attending: Emergency Medicine | Admitting: Emergency Medicine

## 2019-07-09 ENCOUNTER — Encounter (HOSPITAL_COMMUNITY): Payer: Self-pay

## 2019-07-09 DIAGNOSIS — Z79899 Other long term (current) drug therapy: Secondary | ICD-10-CM | POA: Insufficient documentation

## 2019-07-09 DIAGNOSIS — F23 Brief psychotic disorder: Secondary | ICD-10-CM

## 2019-07-09 DIAGNOSIS — F333 Major depressive disorder, recurrent, severe with psychotic symptoms: Secondary | ICD-10-CM | POA: Diagnosis not present

## 2019-07-09 DIAGNOSIS — R45851 Suicidal ideations: Secondary | ICD-10-CM | POA: Insufficient documentation

## 2019-07-09 DIAGNOSIS — R451 Restlessness and agitation: Secondary | ICD-10-CM | POA: Diagnosis present

## 2019-07-09 DIAGNOSIS — F17210 Nicotine dependence, cigarettes, uncomplicated: Secondary | ICD-10-CM | POA: Insufficient documentation

## 2019-07-09 DIAGNOSIS — F22 Delusional disorders: Secondary | ICD-10-CM

## 2019-07-09 HISTORY — DX: Suicidal ideations: R45.851

## 2019-07-09 LAB — ACETAMINOPHEN LEVEL: Acetaminophen (Tylenol), Serum: <10 ug/mL — ABNORMAL LOW (ref 10–30)

## 2019-07-09 LAB — CBC
HCT: 43.6 % (ref 36.0–46.0)
Hemoglobin: 13.6 g/dL (ref 12.0–15.0)
MCH: 28.9 pg (ref 26.0–34.0)
MCHC: 31.2 g/dL (ref 30.0–36.0)
MCV: 92.6 fL (ref 80.0–100.0)
Platelets: 288 10*3/uL (ref 150–400)
RBC: 4.71 MIL/uL (ref 3.87–5.11)
RDW: 12.2 % (ref 11.5–15.5)
WBC: 6.4 10*3/uL (ref 4.0–10.5)
nRBC: 0 % (ref 0.0–0.2)

## 2019-07-09 LAB — COMPREHENSIVE METABOLIC PANEL
ALT: 21 U/L (ref 0–44)
AST: 20 U/L (ref 15–41)
Albumin: 4.6 g/dL (ref 3.5–5.0)
Alkaline Phosphatase: 79 U/L (ref 38–126)
Anion gap: 9 (ref 5–15)
BUN: 11 mg/dL (ref 6–20)
CO2: 25 mmol/L (ref 22–32)
Calcium: 9.6 mg/dL (ref 8.9–10.3)
Chloride: 104 mmol/L (ref 98–111)
Creatinine, Ser: 0.72 mg/dL (ref 0.44–1.00)
GFR calc Af Amer: >60 mL/min (ref >60)
GFR calc non Af Amer: >60 mL/min (ref >60)
Glucose, Bld: 94 mg/dL (ref 70–99)
Potassium: 4 mmol/L (ref 3.5–5.1)
Sodium: 138 mmol/L (ref 135–145)
Total Bilirubin: 0.4 mg/dL (ref 0.3–1.2)
Total Protein: 8.1 g/dL (ref 6.5–8.1)

## 2019-07-09 LAB — DIFFERENTIAL
Abs Immature Granulocytes: 0.04 10*3/uL (ref 0.00–0.07)
Basophils Absolute: 0 10*3/uL (ref 0.0–0.1)
Basophils Relative: 1 %
Eosinophils Absolute: 0.1 10*3/uL (ref 0.0–0.5)
Eosinophils Relative: 1 %
Immature Granulocytes: 1 %
Lymphocytes Relative: 36 %
Lymphs Abs: 2.3 10*3/uL (ref 0.7–4.0)
Monocytes Absolute: 0.4 10*3/uL (ref 0.1–1.0)
Monocytes Relative: 7 %
Neutro Abs: 3.5 10*3/uL (ref 1.7–7.7)
Neutrophils Relative %: 54 %

## 2019-07-09 LAB — SALICYLATE LEVEL: Salicylate Lvl: <7 mg/dL — ABNORMAL LOW (ref 7.0–30.0)

## 2019-07-09 LAB — RAPID URINE DRUG SCREEN, HOSP PERFORMED
Amphetamines: NOT DETECTED
Barbiturates: NOT DETECTED
Benzodiazepines: NOT DETECTED
Cocaine: NOT DETECTED
Opiates: NOT DETECTED
Tetrahydrocannabinol: POSITIVE — AB

## 2019-07-09 LAB — ETHANOL: Alcohol, Ethyl (B): <10 mg/dL (ref <10)

## 2019-07-09 LAB — POC URINE PREG, ED: Preg Test, Ur: NEGATIVE

## 2019-07-09 MED ORDER — ZIPRASIDONE MESYLATE 20 MG IM SOLR
INTRAMUSCULAR | Status: AC
  Administered 2019-07-09: 10 mg via INTRAMUSCULAR
  Filled 2019-07-09: qty 20

## 2019-07-09 MED ORDER — ZIPRASIDONE MESYLATE 20 MG IM SOLR: 10.0000 mg | Freq: Once | INTRAMUSCULAR | Status: AC

## 2019-07-09 MED ORDER — STERILE WATER FOR INJECTION IJ SOLN
INTRAMUSCULAR | Status: AC
  Filled 2019-07-09: qty 10

## 2019-07-09 MED ORDER — IBUPROFEN 400 MG PO TABS: 600.0000 mg | ORAL_TABLET | Freq: Three times a day (TID) | ORAL | Status: DC | PRN

## 2019-07-09 NOTE — ED Notes (Addendum)
Pt given a sandwich, chips and drink per request.

## 2019-07-09 NOTE — ED Notes (Signed)
Pt given lunch tray.

## 2019-07-09 NOTE — ED Notes (Signed)
This NT drew pt's blood, pt cooperative.

## 2019-07-09 NOTE — ED Notes (Signed)
Pt sleeping at this time.

## 2019-07-09 NOTE — ED Notes (Signed)
Lab attempted to draw blood but pt said she needs something to calm down first.

## 2019-07-09 NOTE — ED Notes (Signed)
Pt screaming and talking people who aren't there. This NT tried to talk to pt to calm her down but pt does not respond.
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
## 2019-07-09 NOTE — Progress Notes (Signed)
Patient meets criteria for inpatient treatment per Priscille Loveless, [place]. No appropriate beds at [facility] currently. CSW faxed referrals to the following facilities for review:   [facility]    TTS will continue to seek bed placement.       Ardelle Anton, MSW, LCSW Clinical Social Worker II/Disposition [facility]  Phone: [phone] Fax: [phone]
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
## 2019-07-09 NOTE — ED Notes (Signed)
Pt talking to TTS at this time.  ?

## 2019-07-09 NOTE — ED Notes (Signed)
Pt given dinner tray.

## 2019-07-10 ENCOUNTER — Inpatient Hospital Stay
Admission: EM | Admit: 2019-07-10 | Discharge: 2019-08-25 | DRG: 885 | Disposition: A | Discharge status: Home / Self Care | Payer: Medicaid Other | Source: Ambulatory Visit | Attending: Psychiatry | Admitting: Psychiatry

## 2019-07-10 ENCOUNTER — Encounter: Payer: Self-pay | Admitting: Psychiatry

## 2019-07-10 DIAGNOSIS — Z915 Personal history of self-harm: Secondary | ICD-10-CM

## 2019-07-10 DIAGNOSIS — M79672 Pain in left foot: Secondary | ICD-10-CM

## 2019-07-10 DIAGNOSIS — F418 Other specified anxiety disorders: Secondary | ICD-10-CM | POA: Diagnosis present

## 2019-07-10 DIAGNOSIS — Z818 Family history of other mental and behavioral disorders: Secondary | ICD-10-CM | POA: Diagnosis not present

## 2019-07-10 DIAGNOSIS — Z560 Unemployment, unspecified: Secondary | ICD-10-CM | POA: Diagnosis not present

## 2019-07-10 DIAGNOSIS — Z590 Homelessness: Secondary | ICD-10-CM | POA: Diagnosis not present

## 2019-07-10 DIAGNOSIS — Z79899 Other long term (current) drug therapy: Secondary | ICD-10-CM | POA: Diagnosis not present

## 2019-07-10 DIAGNOSIS — F17210 Nicotine dependence, cigarettes, uncomplicated: Secondary | ICD-10-CM | POA: Diagnosis present

## 2019-07-10 DIAGNOSIS — F4010 Social phobia, unspecified: Secondary | ICD-10-CM | POA: Diagnosis present

## 2019-07-10 DIAGNOSIS — R4589 Other symptoms and signs involving emotional state: Secondary | ICD-10-CM | POA: Diagnosis present

## 2019-07-10 DIAGNOSIS — F200 Paranoid schizophrenia: Principal | ICD-10-CM | POA: Diagnosis present

## 2019-07-10 DIAGNOSIS — G4700 Insomnia, unspecified: Secondary | ICD-10-CM | POA: Diagnosis present

## 2019-07-10 DIAGNOSIS — Z888 Allergy status to other drugs, medicaments and biological substances status: Secondary | ICD-10-CM

## 2019-07-10 DIAGNOSIS — F1914 Other psychoactive substance abuse with psychoactive substance-induced mood disorder: Secondary | ICD-10-CM | POA: Diagnosis present

## 2019-07-10 DIAGNOSIS — Z5181 Encounter for therapeutic drug level monitoring: Secondary | ICD-10-CM | POA: Diagnosis not present

## 2019-07-10 DIAGNOSIS — F1910 Other psychoactive substance abuse, uncomplicated: Secondary | ICD-10-CM | POA: Diagnosis present

## 2019-07-10 DIAGNOSIS — Z20822 Contact with and (suspected) exposure to covid-19: Secondary | ICD-10-CM | POA: Diagnosis present

## 2019-07-10 DIAGNOSIS — F29 Unspecified psychosis not due to a substance or known physiological condition: Secondary | ICD-10-CM | POA: Diagnosis not present

## 2019-07-10 DIAGNOSIS — F1994 Other psychoactive substance use, unspecified with psychoactive substance-induced mood disorder: Secondary | ICD-10-CM | POA: Diagnosis present

## 2019-07-10 DIAGNOSIS — R45851 Suicidal ideations: Secondary | ICD-10-CM | POA: Diagnosis present

## 2019-07-10 DIAGNOSIS — F333 Major depressive disorder, recurrent, severe with psychotic symptoms: Secondary | ICD-10-CM | POA: Diagnosis not present

## 2019-07-10 MED ORDER — ALUM & MAG HYDROXIDE-SIMETH 200-200-20 MG/5ML PO SUSP: 30.0000 mL | ORAL | Status: DC | PRN

## 2019-07-10 MED ORDER — HALOPERIDOL LACTATE 5 MG/ML IJ SOLN: 5.0000 mg | Freq: Four times a day (QID) | INTRAMUSCULAR | Status: DC | PRN

## 2019-07-10 MED ORDER — QUETIAPINE FUMARATE 25 MG PO TABS
50.0000 mg | ORAL_TABLET | Freq: Every day | ORAL | Status: DC
  Administered 2019-07-10: 15:00:00 50 mg via ORAL
  Filled 2019-07-10: qty 2

## 2019-07-10 MED ORDER — DIPHENHYDRAMINE HCL 25 MG PO CAPS
50.0000 mg | ORAL_CAPSULE | Freq: Four times a day (QID) | ORAL | Status: DC | PRN
  Administered 2019-07-22 – 2019-08-16 (×12): 50 mg via ORAL
  Filled 2019-07-10 (×14): qty 2

## 2019-07-10 MED ORDER — STERILE WATER FOR INJECTION IJ SOLN
INTRAMUSCULAR | Status: AC
  Filled 2019-07-10: qty 10

## 2019-07-10 MED ORDER — LORAZEPAM 2 MG PO TABS
2.0000 mg | ORAL_TABLET | Freq: Four times a day (QID) | ORAL | Status: DC | PRN
  Administered 2019-07-11 – 2019-07-14 (×5): 2 mg via ORAL
  Filled 2019-07-10 (×5): qty 1

## 2019-07-10 MED ORDER — ZIPRASIDONE MESYLATE 20 MG IM SOLR
INTRAMUSCULAR | Status: AC
  Filled 2019-07-10: qty 20

## 2019-07-10 MED ORDER — LORAZEPAM 2 MG/ML IJ SOLN: 2.0000 mg | Freq: Four times a day (QID) | INTRAMUSCULAR | Status: DC | PRN

## 2019-07-10 MED ORDER — ACETAMINOPHEN 325 MG PO TABS
650.0000 mg | ORAL_TABLET | Freq: Four times a day (QID) | ORAL | Status: DC | PRN
  Administered 2019-07-17 – 2019-08-18 (×7): 650 mg via ORAL
  Filled 2019-07-10 (×7): qty 2

## 2019-07-10 MED ORDER — ZIPRASIDONE MESYLATE 20 MG IM SOLR
20.0000 mg | Freq: Once | INTRAMUSCULAR | Status: AC
  Administered 2019-07-10: 20 mg via INTRAMUSCULAR

## 2019-07-10 MED ORDER — LORAZEPAM 2 MG/ML IJ SOLN: 2.0000 mg | Freq: Four times a day (QID) | INTRAMUSCULAR | Status: DC

## 2019-07-10 MED ORDER — HALOPERIDOL 5 MG PO TABS
5.0000 mg | ORAL_TABLET | Freq: Four times a day (QID) | ORAL | Status: DC | PRN
  Administered 2019-07-12 – 2019-07-22 (×7): 5 mg via ORAL
  Filled 2019-07-10 (×7): qty 1

## 2019-07-10 MED ORDER — DIPHENHYDRAMINE HCL 25 MG PO CAPS: 50.0000 mg | ORAL_CAPSULE | Freq: Four times a day (QID) | ORAL | Status: DC | PRN

## 2019-07-10 MED ORDER — QUETIAPINE FUMARATE 100 MG PO TABS
400.0000 mg | ORAL_TABLET | Freq: Every day | ORAL | Status: DC
  Administered 2019-07-10: 400 mg via ORAL
  Filled 2019-07-10: qty 4

## 2019-07-10 MED ORDER — HYDROXYZINE HCL 25 MG PO TABS
25.0000 mg | ORAL_TABLET | Freq: Four times a day (QID) | ORAL | Status: DC | PRN
  Administered 2019-07-10: 25 mg via ORAL
  Filled 2019-07-10: qty 1

## 2019-07-10 MED ORDER — ZIPRASIDONE MESYLATE 20 MG IM SOLR
20.0000 mg | Freq: Once | INTRAMUSCULAR | Status: AC
  Administered 2019-07-22: 20 mg via INTRAMUSCULAR
  Filled 2019-07-10: qty 20

## 2019-07-10 MED ORDER — DIPHENHYDRAMINE HCL 50 MG/ML IJ SOLN
50.0000 mg | Freq: Four times a day (QID) | INTRAMUSCULAR | Status: DC | PRN
  Administered 2019-07-15 – 2019-08-09 (×5): 50 mg via INTRAMUSCULAR
  Filled 2019-07-10 (×5): qty 1

## 2019-07-10 MED ORDER — FLUOXETINE HCL 20 MG PO CAPS
30.0000 mg | ORAL_CAPSULE | Freq: Every day | ORAL | Status: DC
  Administered 2019-07-10: 30 mg via ORAL
  Filled 2019-07-10: qty 1

## 2019-07-10 MED ORDER — MAGNESIUM HYDROXIDE 400 MG/5ML PO SUSP: 30.0000 mL | Freq: Every day | ORAL | Status: DC | PRN

## 2019-07-10 NOTE — BH Assessment (Signed)
07/10/2019 Reassessment Patient presented orientated x3, mood "I'm a little upset because Staff keeps provoking me...they are so unprofessional", affect labile with arm rubbing and scratching and ear rubbing and pulling throughout the reassessment.  Patient reports current SI because "I'm being provoked by Staff" and was unsure of a plan or intent.  Patient denied current HI.  Patient reports "random" AH of [place] and television personalities calling her derogatory names such as "bitch and ho."   Patient was unsure if she was experiencing VH or paranoia.  Patient did report "the [employer] is investigating me" and felt it was a positive because "they are on my side."  Patient reports receiving "restful" sleep after receiving a shot this morning for escalated behavior.  Patient also reports having an appetite this morning prior to receiving breakfast.  Per Ricky Ala, NP:  The Patient continues to meet inpatient criteria, continue to seek placement.

## 2019-07-10 NOTE — ED Notes (Signed)
Patient showered.

## 2019-07-10 NOTE — ED Provider Notes (Signed)
12:31 AM Patient is combative, verbally aggressive, cussing, at all variety of staff members.  She calms only briefly before beginning engaging in lengthy diatribe's with substantial profanity, and occasional interaction with people who are clearly not in the room.  Patient will require additional sedation.   Carmin Muskrat, MD 07/10/19 [phone]

## 2019-07-10 NOTE — ED Notes (Signed)
Patient is yelling for help. She says "please make it stop. I DONT feel good!!" notified nurse.
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
## 2019-07-10 NOTE — Progress Notes (Signed)
Pt accepted to [facility], NP is the accepting provider.  Dr. Weber Cooks is the attending provider.  Call report to [phone] Tim @ [facility] notified.   Pt is Voluntary Pt may be transported by [place] Pt scheduled to arrive at [facility] after [place] MSW, [facility] Ph: [phone] Fax: [phone]  07/10/2019 3:07 PM

## 2019-07-10 NOTE — ED Notes (Signed)
Patient is screaming and I have tried to sooth her. However, she said "leave me the fuck alone!!" so I told her to lay down and try to go back to sleep and maybe she would feel better. Patient is not being violent but she is still yelling out " I DO NOT FEEL GOOD!"

## 2019-07-10 NOTE — ED Notes (Signed)
Patient has moments of resting then wakes up responding to internal stimuli. Range of emotions from laughing, to crying, to angry.

## 2019-07-10 NOTE — ED Notes (Signed)
Patient visualized in room responding to internal stimuli; giggling and talking to people who are not there

## 2019-07-11 ENCOUNTER — Other Ambulatory Visit: Payer: Self-pay

## 2019-07-11 MED ORDER — FLUOXETINE HCL 20 MG PO CAPS
40.0000 mg | ORAL_CAPSULE | Freq: Every day | ORAL | Status: DC
  Administered 2019-07-11 – 2019-08-22 (×43): 40 mg via ORAL
  Filled 2019-07-11 (×43): qty 2

## 2019-07-11 MED ORDER — TRAZODONE HCL 50 MG PO TABS
50.0000 mg | ORAL_TABLET | Freq: Every evening | ORAL | Status: DC | PRN
  Administered 2019-07-14 – 2019-08-03 (×16): 50 mg via ORAL
  Filled 2019-07-11 (×17): qty 1

## 2019-07-11 MED ORDER — FLUOXETINE HCL 20 MG PO CAPS: 30.0000 mg | ORAL_CAPSULE | Freq: Every day | ORAL | Status: DC

## 2019-07-11 MED ORDER — RISPERIDONE 1 MG PO TABS
2.0000 mg | ORAL_TABLET | Freq: Every day | ORAL | Status: DC
  Administered 2019-07-11: 2 mg via ORAL
  Filled 2019-07-11: qty 2

## 2019-07-11 MED ORDER — BENZTROPINE MESYLATE 1 MG PO TABS
2.0000 mg | ORAL_TABLET | Freq: Two times a day (BID) | ORAL | Status: DC | PRN
  Administered 2019-07-15 – 2019-08-03 (×3): 2 mg via ORAL
  Filled 2019-07-11 (×3): qty 2

## 2019-07-11 MED ORDER — BENZTROPINE MESYLATE 1 MG/ML IJ SOLN
2.0000 mg | Freq: Two times a day (BID) | INTRAMUSCULAR | Status: DC | PRN
  Filled 2019-07-11: qty 2
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
## 2019-07-11 NOTE — Plan of Care (Signed)
Patient was more visible in the milieu and had no sign of distress. Continues to experience episodes of paranoia and hallucinations. Denying suicidal thoughts. Patient has been cooperative  and had no concerns. Currently in bed sleeping and safety monitored as recommended.

## 2019-07-11 NOTE — Plan of Care (Signed)
D- Patient alert and oriented. Patient presents in an anxious, but pleasant mood on assessment stating that she slept "pretty well" last night and had no complaints to voice to this Probation officer. Patient denies depression, however, endorses anxiety reporting that she doesn't know what's making her feel this way. This Probation officer observed patient getting antsy after another patient on the unit had an outburst. Patient also denies SI, HI, AVH, and pain at this time, although she has been observed laughing/talking to herself. Patient had no stated goals for today.  A- Scheduled medications administered to patient, per MD orders. Support and encouragement provided.  Routine safety checks conducted every 15 minutes.  Patient informed to notify staff with problems or concerns.  R- No adverse drug reactions noted. Patient contracts for safety at this time. Patient compliant with medications and treatment plan. Patient receptive, calm, and cooperative. Patient interacts well with others on the unit.  Patient remains safe at this time.  Problem: Education: Goal: Utilization of techniques to improve thought processes will improve Outcome: Not Progressing Goal: Knowledge of the prescribed therapeutic regimen will improve Outcome: Not Progressing   Problem: Activity: Goal: Interest or engagement in leisure activities will improve Outcome: Not Progressing Goal: Imbalance in normal sleep/wake cycle will improve Outcome: Not Progressing   Problem: Coping: Goal: Coping ability will improve Outcome: Not Progressing Goal: Will verbalize feelings Outcome: Not Progressing   Problem: Health Behavior/Discharge Planning: Goal: Ability to make decisions will improve Outcome: Not Progressing Goal: Compliance with therapeutic regimen will improve Outcome: Not Progressing   Problem: Role Relationship: Goal: Will demonstrate positive changes in social behaviors and relationships Outcome: Not Progressing   Problem:  Safety: Goal: Ability to disclose and discuss suicidal ideas will improve Outcome: Not Progressing Goal: Ability to identify and utilize support systems that promote safety will improve Outcome: Not Progressing   Problem: Self-Concept: Goal: Will verbalize positive feelings about self Outcome: Not Progressing Goal: Level of anxiety will decrease Outcome: Not Progressing   Problem: Education: Goal: Knowledge of the prescribed therapeutic regimen will improve Outcome: Not Progressing   Problem: Coping: Goal: Coping ability will improve Outcome: Not Progressing   Problem: Education: Goal: Knowledge of [facility] Education information/materials will improve Outcome: Not Progressing Goal: Emotional status will improve Outcome: Not Progressing
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
## 2019-07-11 NOTE — Progress Notes (Signed)
This writer went into the dayroom to ask patient to come to the medication room when she's finished with lunch. When this writer went into the dayroom, patient was laughing and talking to herself, clearly responding to internal stimuli.

## 2019-07-11 NOTE — Tx Team (Signed)
Initial Treatment Plan 07/11/2019 12:38 AM [name] [id number]    PATIENT STRESSORS: Financial difficulties Loss of Mother Marital or family conflict Substance abuse   PATIENT STRENGTHS: Ability for insight Communication skills   PATIENT IDENTIFIED PROBLEMS: Anxiety  Depression/hopelessness  Disturbed thought process  Homelessness               DISCHARGE CRITERIA:  Adequate post-discharge living arrangements Improved stabilization in mood, thinking, and/or behavior Verbal commitment to aftercare and medication compliance  PRELIMINARY DISCHARGE PLAN: Outpatient therapy Placement in alternative living arrangements  PATIENT/FAMILY INVOLVEMENT: This treatment plan has been presented to and reviewed with the patient, BRIEONNA SENNETT.  The patient has been given the opportunity to ask questions and make suggestions.  Ronelle Nigh, RN 07/11/2019, 12:38 AM

## 2019-07-11 NOTE — [facility] Suicide Risk Assessment (Signed)
[employer] INPATIENT:  Family/Significant Other Suicide Prevention Education  Suicide Prevention Education:  Patient Refusal for Family/Significant Other Suicide Prevention Education: The patient Julia [name] has refused to provide written consent for family/significant other to be provided Family/Significant Other Suicide Prevention Education during admission and/or prior to discharge.  Physician notified.  SPE completed with pt, as pt refused to consent to family contact. SPI pamphlet provided to pt and pt was encouraged to share information with support network, ask questions, and talk about any concerns relating to SPE. Pt denies access to guns/firearms and verbalized understanding of information provided. Mobile Crisis information also provided to pt.    [place] MSW LCSW 07/11/2019, 11:45 AM

## 2019-07-11 NOTE — Progress Notes (Signed)
Patient just walked up the hallway, from the dayroom, hollering "this is not a fucking game". Staff went up to patient and asked her what's going on, patient stated that she has a stalker down here on the unit. Patient was approached by MD and willingly went to the office to speak with her.

## 2019-07-11 NOTE — [facility] Group Notes (Signed)
LCSW Group Therapy Note  07/11/2019 1:12 PM  Type of Therapy/Topic:  Group Therapy:  Emotion Regulation  Participation Level:  Did Not Attend   Description of Group:   The purpose of this group is to assist patients in learning to regulate negative emotions and experience positive emotions. Patients will be guided to discuss ways in which they have been vulnerable to their negative emotions. These vulnerabilities will be juxtaposed with experiences of positive emotions or situations, and patients will be challenged to use positive emotions to combat negative ones. Special emphasis will be placed on coping with negative emotions in conflict situations, and patients will process healthy conflict resolution skills.  Therapeutic Goals: 1. Patient will identify two positive emotions or experiences to reflect on in order to balance out negative emotions 2. Patient will label two or more emotions that they find the most difficult to experience 3. Patient will demonstrate positive conflict resolution skills through discussion and/or role plays  Summary of Patient Progress: x   Therapeutic Modalities:   Cognitive Behavioral Therapy Feelings Identification Dialectical Behavioral Therapy   Julia [name], MSW, LCSW Clinical Social Work 07/11/2019 1:12 PM

## 2019-07-12 MED ORDER — PALIPERIDONE ER 3 MG PO TB24
6.0000 mg | ORAL_TABLET | Freq: Every day | ORAL | Status: DC
  Administered 2019-07-12 – 2019-07-13 (×2): 6 mg via ORAL
  Filled 2019-07-12: qty 2

## 2019-07-12 NOTE — Progress Notes (Signed)
[facility] MD Progress Note  07/12/2019 2:06 PM LATEFA HAYHURST  MRN:  [id number] Subjective: Patient seen chart reviewed.  Patient known from previous encounter.  18 year old woman with a history of recurrent psychosis and substance abuse.  Patient was in bed when I came to speak to her around lunchtime.  She is somewhat disheveled.  Eye contact is minimal.  Answers questions and only 1 or 2 words.  Does not make much sense when she is talking.  Tells me that she is involved in "multiple investigations".  She at least is able to tell me that after her last discharge she stayed for only a couple of days [facility] the rescue [facility] and then wound up going back to stay with her family for a few days before having to leave once again.  She has no place to stay right now.  Was only compliant with medicine for a few days by what she described. Principal Problem: Psychosis ([place]) Diagnosis: Principal Problem:   Psychosis ([place]) Active Problems:   Polysubstance abuse ([place])   Depressed mood   Substance induced mood disorder ([place])  Total Time spent with patient: 30 minutes  Past Psychiatric History: Past history of multiple hospitalizations already going back years despite her young age  Past Medical History:  Past Medical History:  Diagnosis Date  . Burning with urination 05/03/2015  . Contraceptive management 05/03/2015  . Depression   . Dysmenorrhea 12/30/2013  . Heroin addiction ([place])   . Menorrhagia 12/30/2013  . Menstrual extraction 12/30/2013  . Migraines   . Social anxiety disorder 09/25/2016  . Suicidal ideations   . Vaginal odor 05/03/2015    Past Surgical History:  Procedure Laterality Date  . NO PAST SURGERIES     Family History:  Family History  Problem Relation Age of Onset  . Depression Mother   . Hypertension Father   . Hyperlipidemia Father   . Cancer Paternal Grandmother        breast, uterine  . Cirrhosis Paternal Grandfather        due to alcohol   Family Psychiatric  History:  See previous Social History:  Social History   Substance and Sexual Activity  Alcohol Use Yes   Comment: BAC was clear     Social History   Substance and Sexual Activity  Drug Use Yes  . Types: Marijuana, Oxycodone   Comment: reports oxycodone and marijuana    Social History   Socioeconomic History  . Marital status: Single    Spouse name: Not on file  . Number of children: Not on file  . Years of education: Not on file  . Highest education level: Not on file  Occupational History  . Occupation: Unemployed  Tobacco Use  . Smoking status: Current Every Day Smoker    Packs/day: 0.50    Types: Cigarettes  . Smokeless tobacco: Never Used  . Tobacco comment: Not ready to quit  Substance and Sexual Activity  . Alcohol use: Yes    Comment: BAC was clear  . Drug use: Yes    Types: Marijuana, Oxycodone    Comment: reports oxycodone and marijuana  . Sexual activity: Yes    Birth control/protection: Implant  Other Topics Concern  . Not on file  Social History Narrative   06/23/2019:  Pt stated that she is homeless, that she is a high school graduate, and that she is unemployed and not followed by any outpatient provider.      Lives with Dad. Mom has LGD,  lives in [place]. 11th grader. Dog.   Social Determinants of Health   Financial Resource Strain:   . Difficulty of Paying Living Expenses: Not on file  Food Insecurity:   . Worried About Charity fundraiser in the Last Year: Not on file  . Ran Out of Food in the Last Year: Not on file  Transportation Needs:   . Lack of Transportation (Medical): Not on file  . Lack of Transportation (Non-Medical): Not on file  Physical Activity:   . Days of Exercise per Week: Not on file  . Minutes of Exercise per Session: Not on file  Stress:   . Feeling of Stress : Not on file  Social Connections:   . Frequency of Communication with Friends and Family: Not on file  . Frequency of Social Gatherings with Friends and Family: Not on file   . Attends Religious Services: Not on file  . Active Member of Clubs or Organizations: Not on file  . Attends Archivist Meetings: Not on file  . Marital Status: Not on file   Additional Social History:                         Sleep: Fair  Appetite:  Fair  Current Medications: Current Facility-Administered Medications  Medication Dose Route Frequency Provider Last Rate Last Admin  . acetaminophen (TYLENOL) tablet 650 mg  650 mg Oral Q6H PRN [name], MD      . alum & mag hydroxide-simeth (MAALOX/MYLANTA) 200-200-20 MG/5ML suspension 30 mL  30 mL Oral Q4H PRN [name], MD      . benztropine (COGENTIN) tablet 2 mg  2 mg Oral BID PRN [name], MD       Or  . benztropine mesylate (COGENTIN) injection 2 mg  2 mg Intramuscular BID PRN [name], MD      . diphenhydrAMINE (BENADRYL) capsule 50 mg  50 mg Oral Q6H PRN [name], MD       Or  . diphenhydrAMINE (BENADRYL) injection 50 mg  50 mg Intramuscular Q6H PRN [name], MD      . FLUoxetine (PROZAC) capsule 40 mg  40 mg Oral Daily [name], MD   40 mg at 07/12/19 0759  . haloperidol (HALDOL) tablet 5 mg  5 mg Oral Q6H PRN [name], MD   5 mg at 07/12/19 1028   Or  . haloperidol lactate (HALDOL) injection 5 mg  5 mg Intramuscular Q6H PRN [name], MD      . LORazepam (ATIVAN) injection 2 mg  2 mg Intramuscular Q6H PRN [name], MD      . LORazepam (ATIVAN) tablet 2 mg  2 mg Oral Q6H PRN [name], MD   2 mg at 07/12/19 1028  . magnesium hydroxide (MILK OF MAGNESIA) suspension 30 mL  30 mL Oral Daily PRN [name], MD      . paliperidone (INVEGA) 24 hr tablet 6 mg  6 mg Oral QHS Abbee Cremeens T, MD      . traZODone (DESYREL) tablet 50 mg  50 mg Oral QHS PRN [name], MD      . ziprasidone (GEODON) injection 20 mg  20 mg Intramuscular Once [name], MD        Lab Results: No results found for this or any previous  visit (from the past 48 hour(s)).  Blood Alcohol  level:  Lab Results  Component Value Date   ETH <10 07/09/2019   ETH <10 [id number]    Metabolic Disorder Labs: Lab Results  Component Value Date   HGBA1C 5.2 05/17/2019   MPG 102.54 05/17/2019   MPG 108.28 02/17/2019   Lab Results  Component Value Date   PROLACTIN 31.9 (H) 02/17/2019   PROLACTIN 29.6 (H) 09/25/2016   Lab Results  Component Value Date   CHOL 144 05/17/2019   TRIG 65 05/17/2019   HDL 35 (L) 05/17/2019   CHOLHDL 4.1 05/17/2019   VLDL 13 05/17/2019   LDLCALC 96 05/17/2019   LDLCALC 76 02/17/2019    Physical Findings: AIMS:  , ,  ,  ,    CIWA:    COWS:     Musculoskeletal: Strength & Muscle Tone: within normal limits Gait & Station: normal Patient leans: N/A  Psychiatric Specialty Exam: Physical Exam  Nursing note and vitals reviewed. Constitutional: She appears well-developed and well-nourished.  HENT:  Head: Normocephalic and atraumatic.  Eyes: Pupils are equal, round, and reactive to light. Conjunctivae are normal.  Cardiovascular: Regular rhythm and normal heart sounds.  Respiratory: Effort normal. No respiratory distress.  GI: Soft.  Musculoskeletal:        General: Normal range of motion.     Cervical back: Normal range of motion.  Neurological: She is alert.  Skin: Skin is warm and dry.  Psychiatric: Her affect is blunt. Her speech is delayed. She is slowed. Thought content is paranoid and delusional. Cognition and memory are impaired. She expresses inappropriate judgment.    Review of Systems  Constitutional: Negative.   HENT: Negative.   Eyes: Negative.   Respiratory: Negative.   Cardiovascular: Negative.   Gastrointestinal: Negative.   Musculoskeletal: Negative.   Skin: Negative.   Neurological: Negative.   Psychiatric/Behavioral: Positive for confusion and suicidal ideas.    Blood pressure 106/67, pulse 93, temperature 97.8 F (36.6 C), temperature source Oral, resp. rate  18, height 5\' 4"  (1.626 m), weight 67.2 kg, SpO2 100 %.Body mass index is 25.43 kg/m.  General Appearance: Guarded  Eye Contact:  Minimal  Speech:  Slow  Volume:  Decreased  Mood:  Dysphoric  Affect:  Congruent  Thought Process:  Disorganized  Orientation:  Full (Time, Place, and Person)  Thought Content:  Illogical, Delusions, Paranoid Ideation, Rumination and Tangential  Suicidal Thoughts:  Yes.  without intent/plan  Homicidal Thoughts:  No  Memory:  Immediate;   Fair Recent;   Poor Remote;   Fair  Judgement:  Impaired  Insight:  Shallow  Psychomotor Activity:  Decreased  Concentration:  Concentration: Fair  Recall:  [employer] of Knowledge:  Fair  Language:  Fair  Akathisia:  No  Handed:  Right  AIMS (if indicated):     Assets:  Desire for Improvement Physical Health  ADL's:  Impaired  Cognition:  Impaired,  Mild  Sleep:  Number of Hours: 9     Treatment Plan Summary: Daily contact with patient to assess and evaluate symptoms and progress in treatment, Medication management and Plan Patient continues to express depressed mood psychosis and confusion.  She has been repeatedly noncompliant with medicine.  I am going to change her antipsychotic to [facility] with the hope that we will be able to get her over to a long-acting injectable.  Continue other medicines for now.  Encourage group attendance and participation.  Treatment team will work with her about possibilities for places to live.  Alethia Berthold, MD  07/12/2019, 2:06 PM

## 2019-07-12 NOTE — Plan of Care (Signed)
D- Patient alert and oriented. Patient presents in a pleasant mood on assessment stating that she slept ok last night and had no complaints to voice to this Probation officer. Patient endorsed depression and anxiety stating that she is under investigation because she was apart of a sex-trafficking ring and multiple people are threatening her. Patient denies SI, HI, AVH, and pain at this time. Patient also stated to this writer that overall she is "feeling ok". Patient had no stated goals for today.  A- Scheduled medications administered to patient, per MD orders. Support and encouragement provided.  Routine safety checks conducted every 15 minutes.  Patient informed to notify staff with problems or concerns.  R- No adverse drug reactions noted. Patient contracts for safety at this time. Patient compliant with medications and treatment plan. Patient receptive, calm, and cooperative. Patient interacts well with others on the unit.  Patient remains safe at this time.  Problem: Education: Goal: Utilization of techniques to improve thought processes will improve Outcome: Not Progressing Goal: Knowledge of the prescribed therapeutic regimen will improve Outcome: Not Progressing   Problem: Activity: Goal: Interest or engagement in leisure activities will improve Outcome: Not Progressing Goal: Imbalance in normal sleep/wake cycle will improve Outcome: Not Progressing   Problem: Coping: Goal: Coping ability will improve Outcome: Not Progressing Goal: Will verbalize feelings Outcome: Not Progressing   Problem: Health Behavior/Discharge Planning: Goal: Ability to make decisions will improve Outcome: Not Progressing Goal: Compliance with therapeutic regimen will improve Outcome: Not Progressing   Problem: Role Relationship: Goal: Will demonstrate positive changes in social behaviors and relationships Outcome: Not Progressing   Problem: Safety: Goal: Ability to disclose and discuss suicidal ideas will  improve Outcome: Not Progressing Goal: Ability to identify and utilize support systems that promote safety will improve Outcome: Not Progressing   Problem: Self-Concept: Goal: Will verbalize positive feelings about self Outcome: Not Progressing Goal: Level of anxiety will decrease Outcome: Not Progressing   Problem: Education: Goal: Knowledge of the prescribed therapeutic regimen will improve Outcome: Not Progressing   Problem: Coping: Goal: Coping ability will improve Outcome: Not Progressing   Problem: Education: Goal: Knowledge of [place] [facility] Education information/materials will improve Outcome: Not Progressing Goal: Emotional status will improve Outcome: Not Progressing

## 2019-07-12 NOTE — Progress Notes (Signed)
Patient came to this writer asking for Ativan. When asked what is making her anxious, patient stated "Joe Biden, [place], and [employer], they're all after me". Patient then starts laughing hysterically. Patient was given PRN medication and she walked off laughing.

## 2019-07-12 NOTE — Progress Notes (Signed)
Recreation Therapy Notes   Date: 07/12/2019  Time: 9:30 am  Location: Craft room  Behavioral response: Appropriate  Intervention Topic: Problem Solving  Discussion/Intervention:  Group content on today was focused on problem solving. The group described what problem solving is. Patients expressed how problems affect them and how they deal with problems. Individuals identified healthy ways to deal with problems. Patients explained what normally happens to them when they do not deal with problems. The group expressed reoccurring problems for them. The group participated in the intervention "Ways to Solve problems" where patients were given a chance to explore different ways to solve problems.   Clinical Observations/Feedback:  Patient came to group and began talking and laughing to self. She left group and returned with the same behavior.  [name] LRT/CTRS         [name] 07/12/2019 11:27 AM

## 2019-07-12 NOTE — BHH Group Notes (Signed)
LCSW Group Therapy Note   07/12/2019 1:00 PM  Type of Therapy and Topic:  Group Therapy:  Overcoming Obstacles   Participation Level:  Did Not Attend   Description of Group:    In this group patients will be encouraged to explore what they see as obstacles to their own wellness and recovery. They will be guided to discuss their thoughts, feelings, and behaviors related to these obstacles. The group will process together ways to cope with barriers, with attention given to specific choices patients can make. Each patient will be challenged to identify changes they are motivated to make in order to overcome their obstacles. This group will be process-oriented, with patients participating in exploration of their own experiences as well as giving and receiving support and challenge from other group members.   Therapeutic Goals: 1. Patient will identify personal and current obstacles as they relate to admission. 2. Patient will identify barriers that currently interfere with their wellness or overcoming obstacles.  3. Patient will identify feelings, thought process and behaviors related to these barriers. 4. Patient will identify two changes they are willing to make to overcome these obstacles:      Summary of Patient Progress X   Therapeutic Modalities:   Cognitive Behavioral Therapy Solution Focused Therapy Motivational Interviewing Relapse Prevention Therapy  [name], MSW, LCSW 07/12/2019 11:21 AM

## 2019-07-12 NOTE — Progress Notes (Signed)
Patient just came to the nurses station and asked this writer "what do you do if someone is after you". This Probation officer asked patient who is after her and she stated "[employer], Barack Obama, [place], and Joe Biden. They just called me a dumb bitch, they are going through my file and they want me to look like a paranoid schizophrenic. I hear Trump's voice too".

## 2019-07-13 NOTE — Plan of Care (Signed)
Patient aware of [place] education , able to verbalize understanding . Emotional and mental  health status  improving . Able to maintain frustration  no anger management  concerns. Voice of no safety concerns . Working on Scientific laboratory technician. Aware of  medication received.    Problem: Education: Goal: Utilization of techniques to improve thought processes will improve Outcome: Progressing Goal: Knowledge of the prescribed therapeutic regimen will improve Outcome: Progressing   Problem: Activity: Goal: Interest or engagement in leisure activities will improve Outcome: Progressing Goal: Imbalance in normal sleep/wake cycle will improve Outcome: Progressing   Problem: Coping: Goal: Coping ability will improve Outcome: Progressing Goal: Will verbalize feelings Outcome: Progressing   Problem: Health Behavior/Discharge Planning: Goal: Ability to make decisions will improve Outcome: Progressing Goal: Compliance with therapeutic regimen will improve Outcome: Progressing   Problem: Role Relationship: Goal: Will demonstrate positive changes in social behaviors and relationships Outcome: Progressing   Problem: Safety: Goal: Ability to disclose and discuss suicidal ideas will improve Outcome: Progressing Goal: Ability to identify and utilize support systems that promote safety will improve Outcome: Progressing   Problem: Education: Goal: Knowledge of [place] [facility] Education information/materials will improve Outcome: Progressing Goal: Emotional status will improve Outcome: Progressing   Problem: Coping: Goal: Coping ability will improve Outcome: Progressing

## 2019-07-13 NOTE — BHH Group Notes (Signed)
  LCSW Group Therapy Note  07/13/2019 1:11 PM   Type of Therapy/Topic:  Group Therapy:  Feelings about Diagnosis  Participation Level:  None   Description of Group:   This group will allow patients to explore their thoughts and feelings about diagnoses they have received. Patients will be guided to explore their level of understanding and acceptance of these diagnoses. Facilitator will encourage patients to process their thoughts and feelings about the reactions of others to their diagnosis and will guide patients in identifying ways to discuss their diagnosis with significant others in their lives. This group will be process-oriented, with patients participating in exploration of their own experiences, giving and receiving support, and processing challenge from other group members.   Therapeutic Goals: 1. Patient will demonstrate understanding of diagnosis as evidenced by identifying two or more symptoms of the disorder 2. Patient will be able to express two feelings regarding the diagnosis 3. Patient will demonstrate their ability to communicate their needs through discussion and/or role play  Summary of Patient Progress: Pt was present in group, but did not engage in the group discussion and left after about 10 minutes.   Therapeutic Modalities:   Cognitive Behavioral Therapy Brief Therapy Feelings Identification    Evalina Field, MSW, LCSW Clinical Social Work 07/13/2019 1:11 PM

## 2019-07-13 NOTE — Progress Notes (Signed)
Patient was in her room upon arrival to the unit. Patient was isolative to her room this evening. Patient endorses hearing voices, denies VH. Patient endorses depression rating it 8/10 denies anxiety. Patient denies SI/HI with this Probation officer. Patient compliant with medication administration per MD orders. Patient given education. Patient given support and encouragement to be active in her treatment plan. Patient being monitored Q 15 min safety checks per unit protocol. Patient remains safe on the unit.

## 2019-07-13 NOTE — Progress Notes (Signed)
D:Patient approached Probation officer stating  She is hearing voices . Stated they are real  A:Haldol given  R: voice no other concerns

## 2019-07-13 NOTE — Plan of Care (Signed)
Patient isolative to her room this unit.   Problem: Health Behavior/Discharge Planning: Goal: Compliance with therapeutic regimen will improve Outcome: Not Progressing

## 2019-07-13 NOTE — Progress Notes (Signed)
Patient has slept well all shift. She did come to the medication room for her night time medication. She denies SI/HI/AVH, she verbally contracts for safety. Will continue you to monitor and provide support.

## 2019-07-13 NOTE — BHH Counselor (Signed)
CSW contacted [facility] to see if any available beds.  CSW was informed that there was not.  [name], MSW, LCSW 07/13/2019 10:46 AM

## 2019-07-13 NOTE — Progress Notes (Signed)
D: Patient stated slept good last night .Stated appetite is good and energy level  Is normal. Stated concentration is good . Stated on Depression scale 9 , hopeless 8 and anxiety 0 .( low 0-10 high) Denies suicidal  homicidal ideations  .  No auditory hallucinations  No pain concerns . Appropriate ADL'S. Interacting with peers and staff. Attended  Treatment team  Goal  Today be more patient.  Periods  Of anxiousness  During shift.   A: Encourage patient participation with unit programming . Instruction  Given on  Medication , verbalize understanding. R: Voice no other concerns. Staff continue to monitor

## 2019-07-13 NOTE — Progress Notes (Signed)
CSW contacted Levada Dy with the [facility] program who reported they do have openings for males and females. CSW assisted pt with completing application and faxed application and supporting documents to [place]. Fax was successful.    Evalina Field, MSW, LCSW Clinical Social Work 07/13/2019 3:13 PM

## 2019-07-13 NOTE — BHH Group Notes (Signed)
[facility] Group Notes:  (Nursing/MHT/Case Management/Adjunct)  Date:  07/13/2019  Time:  8:47 PM  Type of Therapy:  Group Therapy  Participation Level:  Did Not Attend  Julia [name] 07/13/2019, 8:47 PM

## 2019-07-13 NOTE — Progress Notes (Signed)
[facility] MD Progress Note  07/13/2019 11:38 AM Julia [name]  MRN:  [id number] Subjective: Follow-up for this 18 year old with depression anxiety substance abuse and psychosis.  Patient attended treatment team.  She reports that her mood is still feeling down.  She is very blunted in her affect and slow and blocked in her thinking.  Denies any suicidal intent and denies any hallucinations.  Very sluggish and difficult to engage on the topic of where she will be living.  Hygiene however seems appropriate and she has been eating well and has not shown any acute dangerous behavior in the hospital Principal Problem: Psychosis ([place]) Diagnosis: Principal Problem:   Psychosis ([place]) Active Problems:   Polysubstance abuse ([place])   Depressed mood   Substance induced mood disorder ([place])  Total Time spent with patient: 30 minutes  Past Psychiatric History: Past history of depression psychotic symptoms substance abuse quite a set of mixed up problems making it a little difficult to disentangle and be certain of long-term diagnoses but with chronic social problems as well.  Past Medical History:  Past Medical History:  Diagnosis Date  . Burning with urination 05/03/2015  . Contraceptive management 05/03/2015  . Depression   . Dysmenorrhea 12/30/2013  . Heroin addiction ([place])   . Menorrhagia 12/30/2013  . Menstrual extraction 12/30/2013  . Migraines   . Social anxiety disorder 09/25/2016  . Suicidal ideations   . Vaginal odor 05/03/2015    Past Surgical History:  Procedure Laterality Date  . NO PAST SURGERIES     Family History:  Family History  Problem Relation Age of Onset  . Depression Mother   . Hypertension Father   . Hyperlipidemia Father   . Cancer Paternal Grandmother        breast, uterine  . Cirrhosis Paternal Grandfather        due to alcohol   Family Psychiatric  History: See previous Social History:  Social History   Substance and Sexual Activity  Alcohol Use Yes   Comment:  BAC was clear     Social History   Substance and Sexual Activity  Drug Use Yes  . Types: Marijuana, Oxycodone   Comment: reports oxycodone and marijuana    Social History   Socioeconomic History  . Marital status: Single    Spouse name: Not on file  . Number of children: Not on file  . Years of education: Not on file  . Highest education level: Not on file  Occupational History  . Occupation: Unemployed  Tobacco Use  . Smoking status: Current Every Day Smoker    Packs/day: 0.50    Types: Cigarettes  . Smokeless tobacco: Never Used  . Tobacco comment: Not ready to quit  Substance and Sexual Activity  . Alcohol use: Yes    Comment: BAC was clear  . Drug use: Yes    Types: Marijuana, Oxycodone    Comment: reports oxycodone and marijuana  . Sexual activity: Yes    Birth control/protection: Implant  Other Topics Concern  . Not on file  Social History Narrative   06/23/2019:  Pt stated that she is homeless, that she is a high school graduate, and that she is unemployed and not followed by any outpatient provider.      Lives with Dad. Mom has LGD, lives in [place]. 11th grader. Dog.   Social Determinants of Health   Financial Resource Strain:   . Difficulty of Paying Living Expenses: Not on file  Food Insecurity:   . Worried  About Running Out of Food in the Last Year: Not on file  . Ran Out of Food in the Last Year: Not on file  Transportation Needs:   . Lack of Transportation (Medical): Not on file  . Lack of Transportation (Non-Medical): Not on file  Physical Activity:   . Days of Exercise per Week: Not on file  . Minutes of Exercise per Session: Not on file  Stress:   . Feeling of Stress : Not on file  Social Connections:   . Frequency of Communication with Friends and Family: Not on file  . Frequency of Social Gatherings with Friends and Family: Not on file  . Attends Religious Services: Not on file  . Active Member of Clubs or Organizations: Not on file  . Attends  Archivist Meetings: Not on file  . Marital Status: Not on file   Additional Social History:                         Sleep: Fair  Appetite:  Fair  Current Medications: Current Facility-Administered Medications  Medication Dose Route Frequency Provider Last Rate Last Admin  . acetaminophen (TYLENOL) tablet 650 mg  650 mg Oral Q6H PRN [name], MD      . alum & mag hydroxide-simeth (MAALOX/MYLANTA) 200-200-20 MG/5ML suspension 30 mL  30 mL Oral Q4H PRN [name], MD      . benztropine (COGENTIN) tablet 2 mg  2 mg Oral BID PRN [name], MD       Or  . benztropine mesylate (COGENTIN) injection 2 mg  2 mg Intramuscular BID PRN [name], MD      . diphenhydrAMINE (BENADRYL) capsule 50 mg  50 mg Oral Q6H PRN [name], MD       Or  . diphenhydrAMINE (BENADRYL) injection 50 mg  50 mg Intramuscular Q6H PRN [name], MD      . FLUoxetine (PROZAC) capsule 40 mg  40 mg Oral Daily [name], MD   40 mg at 07/13/19 0753  . haloperidol (HALDOL) tablet 5 mg  5 mg Oral Q6H PRN [name], MD   5 mg at 07/12/19 1028   Or  . haloperidol lactate (HALDOL) injection 5 mg  5 mg Intramuscular Q6H PRN [name], MD      . LORazepam (ATIVAN) injection 2 mg  2 mg Intramuscular Q6H PRN [name], MD      . LORazepam (ATIVAN) tablet 2 mg  2 mg Oral Q6H PRN [name], MD   2 mg at 07/12/19 1806  . magnesium hydroxide (MILK OF MAGNESIA) suspension 30 mL  30 mL Oral Daily PRN [name], MD      . paliperidone (INVEGA) 24 hr tablet 6 mg  6 mg Oral QHS Ace Bergfeld, [name], MD   6 mg at 07/12/19 2120  . traZODone (DESYREL) tablet 50 mg  50 mg Oral QHS PRN [name], MD      . ziprasidone (GEODON) injection 20 mg  20 mg Intramuscular Once [name], MD        Lab Results: No results found for this or any previous visit (from the past 48 hour(s)).  Blood Alcohol level:  Lab Results  Component Value  Date   [facility] <10 07/09/2019   ETH <10 [id number]    Metabolic Disorder Labs: Lab Results  Component Value Date  HGBA1C 5.2 05/17/2019   MPG 102.54 05/17/2019   MPG 108.28 02/17/2019   Lab Results  Component Value Date   PROLACTIN 31.9 (H) 02/17/2019   PROLACTIN 29.6 (H) 09/25/2016   Lab Results  Component Value Date   CHOL 144 05/17/2019   TRIG 65 05/17/2019   HDL 35 (L) 05/17/2019   CHOLHDL 4.1 05/17/2019   VLDL 13 05/17/2019   LDLCALC 96 05/17/2019   LDLCALC 76 02/17/2019    Physical Findings: AIMS:  , ,  ,  ,    CIWA:    COWS:     Musculoskeletal: Strength & Muscle Tone: within normal limits Gait & Station: normal Patient leans: N/A  Psychiatric Specialty Exam: Physical Exam  Nursing note and vitals reviewed. Constitutional: She appears well-developed and well-nourished.  HENT:  Head: Normocephalic and atraumatic.  Eyes: Pupils are equal, round, and reactive to light. Conjunctivae are normal.  Cardiovascular: Regular rhythm and normal heart sounds.  Respiratory: Effort normal. No respiratory distress.  GI: Soft.  Musculoskeletal:        General: Normal range of motion.     Cervical back: Normal range of motion.  Neurological: She is alert.  Skin: Skin is warm and dry.  Psychiatric: Her affect is blunt. Her speech is delayed. She is slowed and withdrawn. Thought content is paranoid. Cognition and memory are impaired. She expresses inappropriate judgment. She expresses no homicidal and no suicidal ideation.    Review of Systems  Constitutional: Negative.   HENT: Negative.   Eyes: Negative.   Respiratory: Negative.   Cardiovascular: Negative.   Gastrointestinal: Negative.   Musculoskeletal: Negative.   Skin: Negative.   Neurological: Negative.   Psychiatric/Behavioral: Positive for confusion and dysphoric mood. The patient is nervous/anxious.     Blood pressure 101/69, pulse 91, temperature 98 F (36.7 C), temperature source Oral, resp. rate 17,  height 5\' 4"  (1.626 m), weight 67.2 kg, SpO2 98 %.Body mass index is 25.43 kg/m.  General Appearance: Casual  Eye Contact:  Minimal  Speech:  Blocked and Slow  Volume:  Decreased  Mood:  Anxious  Affect:  Constricted  Thought Process:  Coherent  Orientation:  Full (Time, Place, and Person)  Thought Content:  Illogical, Paranoid Ideation and Rumination  Suicidal Thoughts:  No  Homicidal Thoughts:  No  Memory:  Immediate;   Fair Recent;   Poor Remote;   Fair  Judgement:  Impaired  Insight:  Shallow  Psychomotor Activity:  Decreased  Concentration:  Concentration: Poor  Recall:  Poor  Fund of Knowledge:  Poor  Language:  Fair  Akathisia:  No  Handed:  Right  AIMS (if indicated):     Assets:  Desire for Improvement  ADL's:  Impaired  Cognition:  Impaired,  Mild  Sleep:  Number of Hours: 9     Treatment Plan Summary: Daily contact with patient to assess and evaluate symptoms and progress in treatment, Medication management and Plan Patient remains blunted and passive.  Treatment team discussed among ourselves the possibility that part of the patient's problem may be chronic cognitive disability.  She is currently on antidepressant and antipsychotic medicine.  Medically stable.  Keep encouraging her to get up out of bed attend groups and work with the treatment team on finding some kind of safe discharge plan  Alethia Berthold, MD 07/13/2019, 11:38 AM

## 2019-07-13 NOTE — Tx Team (Signed)
Interdisciplinary Treatment and Diagnostic Plan Update  07/13/2019 Time of Session: 9:00AM Julia [name] MRN: [id number]  Principal Diagnosis: Psychosis [name])  Secondary Diagnoses: Principal Problem:   Psychosis ([name]) Active Problems:   Polysubstance abuse (Julia [name])   Depressed mood   Substance induced mood disorder (Julia [name])   Current Medications:  Current Facility-Administered Medications  Medication Dose Route Frequency Provider Last Rate Last Admin  . acetaminophen (TYLENOL) tablet 650 mg  650 mg Oral Q6H PRN [name], MD      . alum & mag hydroxide-simeth (MAALOX/MYLANTA) 200-200-20 MG/5ML suspension 30 mL  30 mL Oral Q4H PRN [name], MD      . benztropine (COGENTIN) tablet 2 mg  2 mg Oral BID PRN [name], MD       Or  . benztropine mesylate (COGENTIN) injection 2 mg  2 mg Intramuscular BID PRN [name], MD      . diphenhydrAMINE (BENADRYL) capsule 50 mg  50 mg Oral Q6H PRN [name], MD       Or  . diphenhydrAMINE (BENADRYL) injection 50 mg  50 mg Intramuscular Q6H PRN [name], MD      . FLUoxetine (PROZAC) capsule 40 mg  40 mg Oral Daily [name], MD   40 mg at 07/13/19 0753  . haloperidol (HALDOL) tablet 5 mg  5 mg Oral Q6H PRN [name], MD   5 mg at 07/12/19 1028   Or  . haloperidol lactate (HALDOL) injection 5 mg  5 mg Intramuscular Q6H PRN [name], MD      . LORazepam (ATIVAN) injection 2 mg  2 mg Intramuscular Q6H PRN [name], MD      . LORazepam (ATIVAN) tablet 2 mg  2 mg Oral Q6H PRN [name], MD   2 mg at 07/12/19 1806  . magnesium hydroxide (MILK OF MAGNESIA) suspension 30 mL  30 mL Oral Daily PRN [name], MD      . paliperidone (INVEGA) 24 hr tablet 6 mg  6 mg Oral QHS Julia [name], Julia Reno, MD   6 mg at 07/12/19 2120  . traZODone (DESYREL) tablet 50 mg  50 mg Oral QHS PRN [name], MD      . ziprasidone (GEODON) injection 20 mg  20 mg Intramuscular Once [name], MD       PTA Medications: Medications Prior to Admission  Medication Sig Dispense Refill Last Dose  . etonogestrel (NEXPLANON) 68 MG IMPL implant 1 each (68 mg total) by Subdermal route once for 1 dose. (Birth control method) 1 each 0   . FLUoxetine (PROZAC) 10 MG capsule Take 3 capsules (30 mg total) by mouth daily. 90 capsule 1   . hydrOXYzine (ATARAX/VISTARIL) 25 MG tablet Take 1 tablet (25 mg total) by mouth every 6 (six) hours as needed for anxiety. 60 tablet 1   . QUEtiapine (SEROQUEL) 50 MG tablet Take 1 tablet (50 mg total) by mouth daily. 30 tablet 1   . traZODone (DESYREL) 50 MG tablet Take 50 mg by mouth at bedtime as needed. for sleep       Patient Stressors: Financial difficulties Loss of Mother Marital or family conflict Substance abuse  Patient Strengths: Ability for insight Communication skills  Treatment Modalities: Medication Management, Group therapy, Case management,  1 to 1 session with clinician, Psychoeducation, Recreational therapy.   Physician Treatment Plan for Primary Diagnosis: Psychosis [name]) Long Term Goal(s): Improvement  in symptoms so as ready for discharge Improvement in symptoms so as ready for discharge   Short Term Goals: Ability to verbalize feelings will improve Ability to disclose and discuss suicidal ideas Ability to demonstrate self-control will improve Ability to identify and develop effective coping behaviors will improve Compliance with prescribed medications will improve  Medication Management: Evaluate patient's response, side effects, and tolerance of medication regimen.  Therapeutic Interventions: 1 to 1 sessions, Unit Group sessions and Medication administration.  Evaluation of Outcomes: Progressing  Physician Treatment Plan for Secondary Diagnosis: Principal Problem:   Psychosis (Julia [name]) Active Problems:   Polysubstance abuse (Julia [name])   Depressed mood   Substance induced mood disorder (Julia [name])  Long Term Goal(s): Improvement  in symptoms so as ready for discharge Improvement in symptoms so as ready for discharge   Short Term Goals: Ability to verbalize feelings will improve Ability to disclose and discuss suicidal ideas Ability to demonstrate self-control will improve Ability to identify and develop effective coping behaviors will improve Compliance with prescribed medications will improve     Medication Management: Evaluate patient's response, side effects, and tolerance of medication regimen.  Therapeutic Interventions: 1 to 1 sessions, Unit Group sessions and Medication administration.  Evaluation of Outcomes: Progressing   RN Treatment Plan for Primary Diagnosis: Psychosis (Julia [name]) Long Term Goal(s): Knowledge of disease and therapeutic regimen to maintain health will improve  Short Term Goals: Ability to demonstrate self-control, Ability to participate in decision making will improve, Ability to verbalize feelings will improve, Ability to disclose and discuss suicidal ideas, Ability to identify and develop effective coping behaviors will improve and Compliance with prescribed medications will improve  Medication Management: RN will administer medications as ordered by provider, will assess and evaluate patient's response and provide education to patient for prescribed medication. RN will report any adverse and/or side effects to prescribing provider.  Therapeutic Interventions: 1 on 1 counseling sessions, Psychoeducation, Medication administration, Evaluate responses to treatment, Monitor vital signs and CBGs as ordered, Perform/monitor CIWA, COWS, AIMS and Fall Risk screenings as ordered, Perform wound care treatments as ordered.  Evaluation of Outcomes: Progressing   LCSW Treatment Plan for Primary Diagnosis: Psychosis (Julia [name]) Long Term Goal(s): Safe transition to appropriate next level of care at discharge, Engage patient in therapeutic group addressing interpersonal concerns.  Short Term Goals: Engage  patient in aftercare planning with referrals and resources, Increase social support, Increase ability to appropriately verbalize feelings, Increase emotional regulation, Facilitate acceptance of mental health diagnosis and concerns and Facilitate patient progression through stages of change regarding substance use diagnoses and concerns  Therapeutic Interventions: Assess for all discharge needs, 1 to 1 time with Social worker, Explore available resources and support systems, Assess for adequacy in community support network, Educate family and significant other(s) on suicide prevention, Complete Psychosocial Assessment, Interpersonal group therapy.  Evaluation of Outcomes: Progressing   Progress in Treatment: Attending groups: Yes. Participating in groups: Yes. Taking medication as prescribed: Yes. Toleration medication: Yes. Family/Significant other contact made: No, will contact:  pt declined. Patient understands diagnosis: Yes. Discussing patient identified problems/goals with staff: Yes. Medical problems stabilized or resolved: Yes. Denies suicidal/homicidal ideation: Yes. Issues/concerns per patient self-inventory: No. Other: none  New problem(s) identified: No, Describe:  none  New Short Term/Long Term Goal(s):  elimination of symptoms of psychosis, medication management for mood stabilization; elimination of SI thoughts; development of comprehensive mental wellness/sobriety plan.  Patient Goals:  "to have a plan"  Discharge Plan or Barriers: Patient is currently homeless.  Patient aftercare  depends on where patient will be staying. CSW is attempting to identify an available bed at local homeless shelters.  Patient reports that she can not [facility] to [facility].  Reason for Continuation of Hospitalization: Anxiety Depression Hallucinations Medication stabilization  Estimated Length of Stay: 1-7 days  Attendees: Patient: Julia [name] 07/13/2019 12:00 PM  Physician:  Dr. Weber Cooks, MD 07/13/2019 12:00 PM  Nursing: Polly Cobia, RN 07/13/2019 12:00 PM  RN Care Manager: 07/13/2019 12:00 PM  Social Worker: [name], [place] 07/13/2019 12:00 PM  Recreational Therapist:  Devin Going, LRT 07/13/2019 12:00 PM  Other: Evalina Field, LCSW 07/13/2019 12:00 PM  Other:  07/13/2019 12:00 PM  Other: 07/13/2019 12:00 PM    Scribe for Treatment Team: Rozann Lesches, LCSW 07/13/2019 12:00 PM

## 2019-07-14 MED ORDER — PALIPERIDONE ER 3 MG PO TB24
9.0000 mg | ORAL_TABLET | Freq: Every day | ORAL | Status: DC
  Administered 2019-07-14 – 2019-07-17 (×4): 9 mg via ORAL
  Filled 2019-07-14 (×4): qty 3

## 2019-07-14 MED ORDER — PALIPERIDONE PALMITATE ER 234 MG/1.5ML IM SUSY
234.0000 mg | PREFILLED_SYRINGE | Freq: Once | INTRAMUSCULAR | Status: AC
  Administered 2019-07-14: 234 mg via INTRAMUSCULAR
  Filled 2019-07-14: qty 1.5

## 2019-07-14 NOTE — Plan of Care (Signed)
Patient aware of [place] education , able to verbalize understanding . Emotional and mental  health status  improving . Able to maintain frustration  no anger management  concerns. Voice of no safety concerns . Working on Scientific laboratory technician. Aware of  medication received.    Problem: Education: Goal: Utilization of techniques to improve thought processes will improve 07/14/2019 1410 by Leodis Liverpool, RN Outcome: Progressing 07/14/2019 0954 by Leodis Liverpool, RN Outcome: Progressing Goal: Knowledge of the prescribed therapeutic regimen will improve 07/14/2019 1410 by Leodis Liverpool, RN Outcome: Progressing 07/14/2019 0954 by Leodis Liverpool, RN Outcome: Progressing   Problem: Activity: Goal: Interest or engagement in leisure activities will improve 07/14/2019 1410 by Leodis Liverpool, RN Outcome: Progressing 07/14/2019 0954 by Leodis Liverpool, RN Outcome: Progressing Goal: Imbalance in normal sleep/wake cycle will improve 07/14/2019 1410 by Leodis Liverpool, RN Outcome: Progressing 07/14/2019 0954 by Leodis Liverpool, RN Outcome: Progressing   Problem: Coping: Goal: Coping ability will improve 07/14/2019 1410 by Leodis Liverpool, RN Outcome: Progressing 07/14/2019 0954 by Leodis Liverpool, RN Outcome: Progressing Goal: Will verbalize feelings 07/14/2019 1410 by Leodis Liverpool, RN Outcome: Progressing 07/14/2019 0954 by Leodis Liverpool, RN Outcome: Progressing   Problem: Safety: Goal: Ability to disclose and discuss suicidal ideas will improve 07/14/2019 1410 by Leodis Liverpool, RN Outcome: Progressing 07/14/2019 0954 by Leodis Liverpool, RN Outcome: Progressing Goal: Ability to identify and utilize support systems that promote safety will improve 07/14/2019 1410 by Leodis Liverpool, RN Outcome: Progressing 07/14/2019 0954 by Leodis Liverpool, RN Outcome: Progressing   Problem: Self-Concept: Goal: Will verbalize positive feelings about self 07/14/2019 1410 by Leodis Liverpool, RN Outcome: Progressing 07/14/2019 0954 by Leodis Liverpool, RN Outcome: Progressing Goal: Level of anxiety will decrease 07/14/2019 1410 by Leodis Liverpool, RN Outcome: Progressing 07/14/2019 0954 by Leodis Liverpool, RN Outcome: Progressing   Problem: Education: Goal: Knowledge of the prescribed therapeutic regimen will improve 07/14/2019 1410 by Leodis Liverpool, RN Outcome: Progressing 07/14/2019 0954 by Leodis Liverpool, RN Outcome: Progressing   Problem: Education: Goal: Knowledge of [place] Education information/materials will improve 07/14/2019 1410 by Leodis Liverpool, RN Outcome: Progressing 07/14/2019 0954 by Leodis Liverpool, RN Outcome: Progressing Goal: Emotional status will improve 07/14/2019 1410 by Leodis Liverpool, RN Outcome: Progressing 07/14/2019 0954 by Leodis Liverpool, RN Outcome: Progressing   Problem: Education: Goal: Emotional status will improve 07/14/2019 1410 by Leodis Liverpool, RN Outcome: Progressing 07/14/2019 0954 by Leodis Liverpool, RN Outcome: Progressing

## 2019-07-14 NOTE — Progress Notes (Signed)
[facility] MD Progress Note  07/14/2019 11:38 AM Julia [name]  MRN:  [id number] Subjective: Follow-up for this 18 year old with schizophrenia versus psychotic depression.  Patient seen chart reviewed.  Patient tells [name] she is hearing voices throughout the day.  She believes that she has a stalker who is sending messages to her threatening her.  She feels frightened that this person will hurt her.  The way she expresses all this makes [name] particularly confident that these are real symptoms.  She stays withdrawn and flat with little eye contact and little conversation.  She is eating okay but otherwise not participating much.  Denies any acute suicidal intent.  Very passive without any clear discharge plan Principal Problem: Psychosis ([place]) Diagnosis: Principal Problem:   Psychosis ([place]) Active Problems:   Polysubstance abuse ([place])   Depressed mood   Substance induced mood disorder ([place])  Total Time spent with patient: 30 minutes  Past Psychiatric History: Patient has a history of gradually developing psychotic disorder as well as substance abuse  Past Medical History:  Past Medical History:  Diagnosis Date  . Burning with urination 05/03/2015  . Contraceptive management 05/03/2015  . Depression   . Dysmenorrhea 12/30/2013  . Heroin addiction ([place])   . Menorrhagia 12/30/2013  . Menstrual extraction 12/30/2013  . Migraines   . Social anxiety disorder 09/25/2016  . Suicidal ideations   . Vaginal odor 05/03/2015    Past Surgical History:  Procedure Laterality Date  . NO PAST SURGERIES     Family History:  Family History  Problem Relation Age of Onset  . Depression Mother   . Hypertension Father   . Hyperlipidemia Father   . Cancer Paternal Grandmother        breast, uterine  . Cirrhosis Paternal Grandfather        due to alcohol   Family Psychiatric  History: See previous Social History:  Social History   Substance and Sexual Activity  Alcohol Use Yes   Comment: BAC was clear      Social History   Substance and Sexual Activity  Drug Use Yes  . Types: Marijuana, Oxycodone   Comment: reports oxycodone and marijuana    Social History   Socioeconomic History  . Marital status: Single    Spouse name: Not on file  . Number of children: Not on file  . Years of education: Not on file  . Highest education level: Not on file  Occupational History  . Occupation: Unemployed  Tobacco Use  . Smoking status: Current Every Day Smoker    Packs/day: 0.50    Types: Cigarettes  . Smokeless tobacco: Never Used  . Tobacco comment: Not ready to quit  Substance and Sexual Activity  . Alcohol use: Yes    Comment: BAC was clear  . Drug use: Yes    Types: Marijuana, Oxycodone    Comment: reports oxycodone and marijuana  . Sexual activity: Yes    Birth control/protection: Implant  Other Topics Concern  . Not on file  Social History Narrative   06/23/2019:  Pt stated that she is homeless, that she is a high school graduate, and that she is unemployed and not followed by any outpatient provider.      Lives with Dad. Mom has LGD, lives in [place]. 11th grader. Dog.   Social Determinants of Health   Financial Resource Strain:   . Difficulty of Paying Living Expenses: Not on file  Food Insecurity:   . Worried About Charity fundraiser  in the Last Year: Not on file  . Ran Out of Food in the Last Year: Not on file  Transportation Needs:   . Lack of Transportation (Medical): Not on file  . Lack of Transportation (Non-Medical): Not on file  Physical Activity:   . Days of Exercise per Week: Not on file  . Minutes of Exercise per Session: Not on file  Stress:   . Feeling of Stress : Not on file  Social Connections:   . Frequency of Communication with Friends and Family: Not on file  . Frequency of Social Gatherings with Friends and Family: Not on file  . Attends Religious Services: Not on file  . Active Member of Clubs or Organizations: Not on file  . Attends Theatre manager Meetings: Not on file  . Marital Status: Not on file   Additional Social History:                         Sleep: Fair  Appetite:  Fair  Current Medications: Current Facility-Administered Medications  Medication Dose Route Frequency Provider Last Rate Last Admin  . acetaminophen (TYLENOL) tablet 650 mg  650 mg Oral Q6H PRN [name], MD      . alum & mag hydroxide-simeth (MAALOX/MYLANTA) 200-200-20 MG/5ML suspension 30 mL  30 mL Oral Q4H PRN [name], MD      . benztropine (COGENTIN) tablet 2 mg  2 mg Oral BID PRN [name], MD       Or  . benztropine mesylate (COGENTIN) injection 2 mg  2 mg Intramuscular BID PRN [name], MD      . diphenhydrAMINE (BENADRYL) capsule 50 mg  50 mg Oral Q6H PRN [name], MD       Or  . diphenhydrAMINE (BENADRYL) injection 50 mg  50 mg Intramuscular Q6H PRN [name], MD      . FLUoxetine (PROZAC) capsule 40 mg  40 mg Oral Daily [name], MD   40 mg at 07/14/19 [id number]  . haloperidol (HALDOL) tablet 5 mg  5 mg Oral Q6H PRN [name], MD   5 mg at 07/13/19 1701   Or  . haloperidol lactate (HALDOL) injection 5 mg  5 mg Intramuscular Q6H PRN [name], MD      . magnesium hydroxide (MILK OF MAGNESIA) suspension 30 mL  30 mL Oral Daily PRN [name], MD      . paliperidone (INVEGA SUSTENNA) injection 234 mg  234 mg Intramuscular Once Trustin Chapa T, MD      . paliperidone (INVEGA) 24 hr tablet 9 mg  9 mg Oral QHS Shalese Strahan T, MD      . traZODone (DESYREL) tablet 50 mg  50 mg Oral QHS PRN [name], MD      . ziprasidone (GEODON) injection 20 mg  20 mg Intramuscular Once [name], MD        Lab Results: No results found for this or any previous visit (from the past 48 hour(s)).  Blood Alcohol level:  Lab Results  Component Value Date   [facility] <10 07/09/2019   ETH <10 [id number]    Metabolic Disorder Labs: Lab Results  Component Value Date    HGBA1C 5.2 05/17/2019   MPG 102.54 05/17/2019   MPG 108.28 02/17/2019   Lab Results  Component Value Date   PROLACTIN 31.9 (H) 02/17/2019   PROLACTIN 29.6 (H)  09/25/2016   Lab Results  Component Value Date   CHOL 144 05/17/2019   TRIG 65 05/17/2019   HDL 35 (L) 05/17/2019   CHOLHDL 4.1 05/17/2019   VLDL 13 05/17/2019   LDLCALC 96 05/17/2019   LDLCALC 76 02/17/2019    Physical Findings: AIMS:  , ,  ,  ,    CIWA:    COWS:     Musculoskeletal: Strength & Muscle Tone: within normal limits Gait & Station: normal Patient leans: N/A  Psychiatric Specialty Exam: Physical Exam  Nursing note and vitals reviewed. Constitutional: She appears well-developed and well-nourished.  HENT:  Head: Normocephalic and atraumatic.  Eyes: Pupils are equal, round, and reactive to light. Conjunctivae are normal.  Cardiovascular: Regular rhythm and normal heart sounds.  Respiratory: Effort normal.  GI: Soft.  Musculoskeletal:        General: Normal range of motion.     Cervical back: Normal range of motion.  Neurological: She is alert.  Skin: Skin is warm and dry.  Psychiatric: Her affect is blunt. Her speech is tangential. She is slowed and withdrawn. Thought content is paranoid and delusional. Cognition and memory are impaired. She expresses impulsivity and inappropriate judgment.    Review of Systems  Constitutional: Negative.   HENT: Negative.   Eyes: Negative.   Respiratory: Negative.   Cardiovascular: Negative.   Gastrointestinal: Negative.   Musculoskeletal: Negative.   Skin: Negative.   Neurological: Negative.   Psychiatric/Behavioral: Positive for hallucinations. The patient is nervous/anxious.     Blood pressure 113/68, pulse (!) 105, temperature 97.6 F (36.4 C), temperature source Oral, resp. rate 17, height 5\' 4"  (1.626 m), weight 67.2 kg, SpO2 98 %.Body mass index is 25.43 kg/m.  General Appearance: Casual and Guarded  Eye Contact:  Minimal  Speech:  Slow   Volume:  Decreased  Mood:  Depressed  Affect:  Blunt  Thought Process:  Disorganized  Orientation:  Full (Time, Place, and Person)  Thought Content:  Illogical, Delusions, Hallucinations: Auditory and Paranoid Ideation  Suicidal Thoughts:  No  Homicidal Thoughts:  No  Memory:  Immediate;   Fair Recent;   Fair Remote;   Fair  Judgement:  Impaired  Insight:  Shallow  Psychomotor Activity:  Decreased  Concentration:  Concentration: Poor  Recall:  [employer] of Knowledge:  Fair  Language:  Fair  Akathisia:  No  Handed:  Right  AIMS (if indicated):     Assets:  Desire for Improvement Physical Health Resilience  ADL's:  Impaired  Cognition:  Impaired,  Mild  Sleep:  Number of Hours: 7.75     Treatment Plan Summary: Daily contact with patient to assess and evaluate symptoms and progress in treatment, Medication management and Plan 18 year old with psychotic disorder schizophrenia versus psychotic depression continues to have disturbing hallucinations along with poor self-care past 70 extremely impaired cognition.  No active suicidal thought but feels afraid that someone is going to hurt her.  I am going to go ahead and increase the dose of the Invega to 9 mg today as well as give her the first of the Invega loading injections.  Continue antidepressant.  Encourage attendance at groups.  Alethia Berthold, MD 07/14/2019, 11:38 AM

## 2019-07-14 NOTE — Progress Notes (Signed)
Recreation Therapy Notes  Date: 07/14/2019  Time: 9:30am    Location: Craft room   Behavioral response: N/A   Intervention Topic: Self-esteem   Discussion/Intervention: Patient did not attend group.   Clinical Observations/Feedback:  Patient did not attend group.   [name] LRT/CTRS        [name] 07/14/2019 11:33 AM

## 2019-07-14 NOTE — [facility] Group Notes (Signed)
LCSW Group Therapy Note  07/14/2019 1:00 PM  Type of Therapy/Topic:  Group Therapy:  Emotion Regulation  Participation Level:  Did Not Attend   Description of Group:   The purpose of this group is to assist patients in learning to regulate negative emotions and experience positive emotions. Patients will be guided to discuss ways in which they have been vulnerable to their negative emotions. These vulnerabilities will be juxtaposed with experiences of positive emotions or situations, and patients will be challenged to use positive emotions to combat negative ones. Special emphasis will be placed on coping with negative emotions in conflict situations, and patients will process healthy conflict resolution skills.  Therapeutic Goals: 1. Patient will identify two positive emotions or experiences to reflect on in order to balance out negative emotions 2. Patient will label two or more emotions that they find the most difficult to experience 3. Patient will demonstrate positive conflict resolution skills through discussion and/or role plays  Summary of Patient Progress: X  Therapeutic Modalities:   Cognitive Behavioral Therapy Feelings Identification Dialectical Behavioral Therapy  Julia [name], MSW, LCSW 07/14/2019 11:11 AM

## 2019-07-14 NOTE — Progress Notes (Signed)
D:Psychosis   A:Patient approached Probation officer  Requesting  Ativan Stated it was for her nerves  . Patient remained Isolated to her room . Limited interaction with her peers . Appetite fair . Patient stated slept fair last night but noted to sleep all during am shift .  Stated appetite is good and energy level  Is normal. Stated concentration is good . Stated on Depression scale 9, hopeless 8  and anxiety 0 .( low 0-10 high) Denies suicidal  homicidal ideations .No auditory hallucinations  No pain concerns . Appropriate ADL'S. Encourage patient participation with unit programming . Instruction  Given on  Medication , verbalize understanding.  R: Voice no other concerns. Staff continue to monitor

## 2019-07-14 NOTE — Plan of Care (Signed)
Patient aware of [place] education , able to verbalize understanding . Emotional and mental  health status  improving . Able to maintain frustration  no anger management  concerns. Voice of no safety concerns . Working on Scientific laboratory technician. Aware of  medication received.      Problem: Education: Goal: Utilization of techniques to improve thought processes will improve Outcome: Progressing Goal: Knowledge of the prescribed therapeutic regimen will improve Outcome: Progressing   Problem: Coping: Goal: Coping ability will improve Outcome: Progressing Goal: Will verbalize feelings Outcome: Progressing   Problem: Health Behavior/Discharge Planning: Goal: Ability to make decisions will improve Outcome: Progressing   Problem: Health Behavior/Discharge Planning: Goal: Ability to make decisions will improve Outcome: Progressing Goal: Compliance with therapeutic regimen will improve Outcome: Progressing   Problem: Self-Concept: Goal: Will verbalize positive feelings about self Outcome: Progressing Goal: Level of anxiety will decrease Outcome: Progressing   Problem: Education: Goal: Knowledge of the prescribed therapeutic regimen will improve Outcome: Progressing   Problem: Coping: Goal: Coping ability will improve Outcome: Progressing   Problem: Education: Goal: Knowledge of [place] [facility] Education information/materials will improve Outcome: Progressing Goal: Emotional status will improve Outcome: Progressing   Problem: Education: Goal: Emotional status will improve Outcome: Progressing

## 2019-07-14 NOTE — Progress Notes (Signed)
CSW received a phone call from [place] with the [place] program who reported that pt has been denied because her mental health is too significant and pt is also too young as they don't normally take people as young as pt.   Evalina Field, MSW, LCSW Clinical Social Work 07/14/2019 12:39 PM

## 2019-07-14 NOTE — Progress Notes (Signed)
Recreation Therapy Notes  INPATIENT RECREATION THERAPY ASSESSMENT  Patient Details Name: Julia [name] MRN: [id number] DOB: 12-17-2000 Today's Date: 07/14/2019       Information Obtained From: Patient  Able to Participate in Assessment/Interview: Yes  Patient Presentation: Responsive  Reason for Admission (Per Patient): Active Symptoms  Patient Stressors:    Coping Skills:   Deep Breathing  Leisure Interests (2+):  Music - Listen, Art - Coloring  Frequency of Recreation/Participation: Weekly  Awareness of Community Resources:     [employer]:     Current Use:    If no, Barriers?:    Expressed Interest in [employer] Information:    [place] of Residence:  [employer]  Patient Main Form of Transportation: Musician  Patient Strengths:  Thinking for myself  Patient Identified Areas of Improvement:  being homeless  Patient Goal for Hospitalization:  Find somewhere to go  Current SI (including self-harm):  No  Current HI:  No  Current AVH: No  Staff Intervention Plan: Group Attendance, Collaborate with Interdisciplinary Treatment Team  Consent to Intern Participation: N/A  Naydeline Morace 07/14/2019, 1:57 PM

## 2019-07-14 NOTE — Progress Notes (Signed)
CSW contacted the [facility] program regarding pt referral. Levada Dy reported she has not yet reviewed it and will give CSW a call back once she does.   Evalina Field, MSW, LCSW Clinical Social Work 07/14/2019 10:01 AM

## 2019-07-15 NOTE — Plan of Care (Signed)
Patient aware of [place] education , able to verbalize understanding  with redirection from staff. Emotional and mental  health status  improving . Able to maintain frustration  no anger management  concerns. Voice of no safety concerns . Working on Scientific laboratory technician. Aware of  medication received.    Problem: Education: Goal: Utilization of techniques to improve thought processes will improve Outcome: Progressing Goal: Knowledge of the prescribed therapeutic regimen will improve Outcome: Progressing   Problem: Activity: Goal: Interest or engagement in leisure activities will improve Outcome: Progressing Goal: Imbalance in normal sleep/wake cycle will improve Outcome: Progressing   Problem: Coping: Goal: Coping ability will improve Outcome: Progressing Goal: Will verbalize feelings Outcome: Progressing   Problem: Health Behavior/Discharge Planning: Goal: Ability to make decisions will improve Outcome: Progressing Goal: Compliance with therapeutic regimen will improve Outcome: Progressing   Problem: Role Relationship: Goal: Will demonstrate positive changes in social behaviors and relationships Outcome: Progressing   Problem: Safety: Goal: Ability to disclose and discuss suicidal ideas will improve Outcome: Progressing Goal: Ability to identify and utilize support systems that promote safety will improve Outcome: Progressing   Problem: Self-Concept: Goal: Will verbalize positive feelings about self Outcome: Progressing Goal: Level of anxiety will decrease Outcome: Progressing   Problem: Coping: Goal: Coping ability will improve Outcome: Progressing   Problem: Education: Goal: Knowledge of [place] [facility] Education information/materials will improve Outcome: Progressing Goal: Emotional status will improve Outcome: Progressing

## 2019-07-15 NOTE — Progress Notes (Signed)
Recreation Therapy Notes  Date: 07/15/2019  Time: 9:30 am   Location: Craft room   Behavioral response: N/A   Intervention Topic: Time Management   Discussion/Intervention: Patient did not attend group.   Clinical Observations/Feedback:  Patient did not attend group.   Sayre Witherington LRT/CTRS        Nevaeh Casillas 07/15/2019 11:58 AM

## 2019-07-15 NOTE — Progress Notes (Signed)
Patient was in her room upon arrival to the unit. Patient was isolative to her room this evening. Patient endorses hearing voices, denies VH. Patient endorses depression rating it 7/10 denies anxiety. Patient denies SI/HI with this Probation officer. Patient observed being more active on the unit this evening and interacting appropriately with staff and peers. Patient compliant with medication administration per MD orders. Patient given education. Patient given support and encouragement to be active in her treatment plan. Patient being monitored Q 15 min safety checks per unit protocol. Patient remains safe on the unit.

## 2019-07-15 NOTE — Progress Notes (Signed)
[facility] MD Progress Note  07/15/2019 12:13 PM Julia [name]  MRN:  [id number]   Julia [name] is a 18yo F patient with schizophrenia vs MDD with psychotic features.  Patient seen. Chart reviewed. Patient discussed with nursing; no overnight events reported. Patient observed being more active on the unit and interacting appropriately with staff and peers. Patient compliant with medications.  Subjective: Patient reports she is feelig "down", but denies suicidal thoughts, plans. When she asked about hallucinations, she gave ambiguous answer "so-so". She reports feeling safe in the hospital, but would not be safe to leave a hospital. She reports she slept well last night, ate well and denies any current physical complaints or side effects from her medications.   Principal Problem: Psychosis ([place]) Diagnosis: Principal Problem:   Psychosis ([place]) Active Problems:   Polysubstance abuse ([place])   Depressed mood   Substance induced mood disorder ([place])  Total Time spent with patient: 15 minutes  Past Psychiatric History: see H&P  Past Medical History:  Past Medical History:  Diagnosis Date  . Burning with urination 05/03/2015  . Contraceptive management 05/03/2015  . Depression   . Dysmenorrhea 12/30/2013  . Heroin addiction ([place])   . Menorrhagia 12/30/2013  . Menstrual extraction 12/30/2013  . Migraines   . Social anxiety disorder 09/25/2016  . Suicidal ideations   . Vaginal odor 05/03/2015    Past Surgical History:  Procedure Laterality Date  . NO PAST SURGERIES     Family History:  Family History  Problem Relation Age of Onset  . Depression Mother   . Hypertension Father   . Hyperlipidemia Father   . Cancer Paternal Grandmother        breast, uterine  . Cirrhosis Paternal Grandfather        due to alcohol   Family Psychiatric  History: see H&P Social History:  Social History   Substance and Sexual Activity  Alcohol Use Yes   Comment: BAC was clear     Social History    Substance and Sexual Activity  Drug Use Yes  . Types: Marijuana, Oxycodone   Comment: reports oxycodone and marijuana    Social History   Socioeconomic History  . Marital status: Single    Spouse name: Not on file  . Number of children: Not on file  . Years of education: Not on file  . Highest education level: Not on file  Occupational History  . Occupation: Unemployed  Tobacco Use  . Smoking status: Current Every Day Smoker    Packs/day: 0.50    Types: Cigarettes  . Smokeless tobacco: Never Used  . Tobacco comment: Not ready to quit  Substance and Sexual Activity  . Alcohol use: Yes    Comment: BAC was clear  . Drug use: Yes    Types: Marijuana, Oxycodone    Comment: reports oxycodone and marijuana  . Sexual activity: Yes    Birth control/protection: Implant  Other Topics Concern  . Not on file  Social History Narrative   06/23/2019:  Pt stated that she is homeless, that she is a high school graduate, and that she is unemployed and not followed by any outpatient provider.      Lives with Dad. Mom has LGD, lives in [place]. 11th grader. Dog.   Social Determinants of Health   Financial Resource Strain:   . Difficulty of Paying Living Expenses: Not on file  Food Insecurity:   . Worried About Charity fundraiser in the Last Year: Not on file  .  Ran Out of Food in the Last Year: Not on file  Transportation Needs:   . Lack of Transportation (Medical): Not on file  . Lack of Transportation (Non-Medical): Not on file  Physical Activity:   . Days of Exercise per Week: Not on file  . Minutes of Exercise per Session: Not on file  Stress:   . Feeling of Stress : Not on file  Social Connections:   . Frequency of Communication with Friends and Family: Not on file  . Frequency of Social Gatherings with Friends and Family: Not on file  . Attends Religious Services: Not on file  . Active Member of Clubs or Organizations: Not on file  . Attends Archivist Meetings: Not  on file  . Marital Status: Not on file   Additional Social History:     Sleep: Fair  Appetite:  Fair  Current Medications: Current Facility-Administered Medications  Medication Dose Route Frequency Provider Last Rate Last Admin  . acetaminophen (TYLENOL) tablet 650 mg  650 mg Oral Q6H PRN [name], MD      . alum & mag hydroxide-simeth (MAALOX/MYLANTA) 200-200-20 MG/5ML suspension 30 mL  30 mL Oral Q4H PRN [name], MD      . benztropine (COGENTIN) tablet 2 mg  2 mg Oral BID PRN [name], MD       Or  . benztropine mesylate (COGENTIN) injection 2 mg  2 mg Intramuscular BID PRN [name], MD      . diphenhydrAMINE (BENADRYL) capsule 50 mg  50 mg Oral Q6H PRN [name], MD       Or  . diphenhydrAMINE (BENADRYL) injection 50 mg  50 mg Intramuscular Q6H PRN [name], MD      . FLUoxetine (PROZAC) capsule 40 mg  40 mg Oral Daily [name], MD   40 mg at 07/15/19 [id number]  . haloperidol (HALDOL) tablet 5 mg  5 mg Oral Q6H PRN [name], MD   5 mg at 07/14/19 1946   Or  . haloperidol lactate (HALDOL) injection 5 mg  5 mg Intramuscular Q6H PRN [name], MD      . magnesium hydroxide (MILK OF MAGNESIA) suspension 30 mL  30 mL Oral Daily PRN [name], MD      . paliperidone (INVEGA) 24 hr tablet 9 mg  9 mg Oral QHS [name], [name], MD   9 mg at 07/14/19 2137  . traZODone (DESYREL) tablet 50 mg  50 mg Oral QHS PRN [name], MD   50 mg at 07/14/19 2139  . ziprasidone (GEODON) injection 20 mg  20 mg Intramuscular Once [name], MD        Lab Results: No results found for this or any previous visit (from the past 48 hour(s)).  Blood Alcohol level:  Lab Results  Component Value Date   ETH <10 07/09/2019   ETH <10 [id number]    Metabolic Disorder Labs: Lab Results  Component Value Date   HGBA1C 5.2 05/17/2019   MPG 102.54 05/17/2019   MPG 108.28 02/17/2019   Lab Results  Component Value Date    PROLACTIN 31.9 (H) 02/17/2019   PROLACTIN 29.6 (H) 09/25/2016   Lab Results  Component Value Date   CHOL 144 05/17/2019   TRIG 65 05/17/2019   HDL 35 (L) 05/17/2019   CHOLHDL 4.1 05/17/2019   VLDL 13 05/17/2019   LDLCALC 96 05/17/2019   LDLCALC 76  02/17/2019    Physical Findings: AIMS:  , ,  ,  ,    CIWA:    COWS:     Musculoskeletal: Strength & Muscle Tone: within normal limits Gait & Station: normal Patient leans: N/A  Psychiatric Specialty Exam: Physical Exam  Review of Systems  Blood pressure 125/77, pulse (!) 103, temperature 97.8 F (36.6 C), temperature source Oral, resp. rate 17, height 5\' 4"  (1.626 m), weight 67.2 kg, SpO2 97 %.Body mass index is 25.43 kg/m.  General Appearance: Casual  Eye Contact:  Fair  Speech:  Normal Rate  Volume:  Decreased  Mood:  Depressed  Affect:  Blunt  Thought Process:  Coherent  Orientation:  Full (Time, Place, and Person)  Thought Content:  Illogical, Delusions and Paranoid Ideation  Suicidal Thoughts:  No  Homicidal Thoughts:  No  Memory:  Immediate;   Fair Recent;   Fair  Judgement:  Other:  limited  Insight:  Shallow  Psychomotor Activity:  Decreased  Concentration:  Concentration: Fair and Attention Span: Fair  Recall:  [employer] of Knowledge:  Good  Language:  Good  Akathisia:  No  Handed:  Right  AIMS (if indicated):     Assets:  Desire for Improvement Physical Health Resilience  ADL's:  Intact  Cognition:  Impaired,  Mild  Sleep:  Number of Hours: 7.25     Treatment Plan Summary: Daily contact with patient to assess and evaluate symptoms and progress in treatment and Medication management   Julia [name] is a 18yo F patient with schizophrenia vs MDD with psychotic features. Patient observed being more active in the unit, although she is still depressed and delusional. The dose of oral antipsychotic was increased yesterday and the patient received LAI medication as well, patient is on antidepressant as well  and all medications will be continued today without changes.  Impression: Schizophrenia vs MDD with psychotic features.   Plan: -continue inpatient psych admission; 15-minute checks; daily contact with patient to assess and evaluate symptoms and progress in treatment; psychoeducation.  -continue scheduled psych medications: Paliperidone 14mh PO daily for psychosis; Lorayne Bender Suistenna 234mg  IM administered on 12/30, next dose is in 5-7 days. Prozac 40mg  pO daily for depression, anxiety.  -continue PRN medications.  -Disposition: to be determined. Patient is not ready for discharge yet.  Larita Fife, MD 07/15/2019, 12:13 PM

## 2019-07-15 NOTE — Plan of Care (Signed)
Patient continues to sleep most of the shift for this Probation officer.   Problem: Activity: Goal: Imbalance in normal sleep/wake cycle will improve Outcome: Not Progressing

## 2019-07-15 NOTE — Plan of Care (Signed)
Patient more interacting on the unit and less isolative this evening. Patient awake longer this evening than previous ones observed by this Probation officer.   Problem: Activity: Goal: Interest or engagement in leisure activities will improve Outcome: Progressing Goal: Imbalance in normal sleep/wake cycle will improve Outcome: Progressing

## 2019-07-15 NOTE — Progress Notes (Signed)
Patient was in her room upon arrival to the unit. Patient was isolative to her room this evening. Patient endorses hearing voices, denies VH. Patient endorses depression rating it 8/10 denies anxiety. Patient denies SI/HI with this Probation officer. Patient was isolative to her room this evening and minimal with staff and peers. Patient compliant with medication administration per MD orders. Patient given education. Patient given support and encouragement to be active in her treatment plan. Patient being monitored Q 15 min safety checks per unit protocol. Patient remains safe on the unit.

## 2019-07-15 NOTE — Progress Notes (Signed)
D:Dystonic Reaction  A:Affect pleasant on approach smiling  , Voice of no more stiffness . No cogwheeling   R: Staff continue to monitor.

## 2019-07-15 NOTE — BHH Group Notes (Signed)
LCSW Group Therapy Note  07/15/2019 11:28 AM  Type of Therapy/Topic:  Group Therapy:  Balance in Life  Participation Level:  Did Not Attend  Description of Group:    This group will address the concept of balance and how it feels and looks when one is unbalanced. Patients will be encouraged to process areas in their lives that are out of balance and identify reasons for remaining unbalanced. Facilitators will guide patients in utilizing problem-solving interventions to address and correct the stressor making their life unbalanced. Understanding and applying boundaries will be explored and addressed for obtaining and maintaining a balanced life. Patients will be encouraged to explore ways to assertively make their unbalanced needs known to significant others in their lives, using other group members and facilitator for support and feedback.  Therapeutic Goals: 1. Patient will identify two or more emotions or situations they have that consume much of in their lives. 2. Patient will identify signs/triggers that life has become out of balance:  3. Patient will identify two ways to set boundaries in order to achieve balance in their lives:  4. Patient will demonstrate ability to communicate their needs through discussion and/or role plays  Summary of Patient Progress: x     Therapeutic Modalities:   Cognitive Behavioral Therapy Solution-Focused Therapy Assertiveness Training  Evalina Field, MSW, LCSW Clinical Social Work 07/15/2019 11:28 AM

## 2019-07-15 NOTE — Progress Notes (Signed)
D: Patient stated slept good last night .Stated appetite good and energy level  normal. Stated concentration is good . Stated on Depression scale 9 , hopeless 0 and anxiety 0 .( low 0-10 high) Denies suicidal  homicidal ideations  .  No auditory hallucinations  No pain concerns . Appropriate ADL'S. Interacting with peers and staff. Patient aware of [place] education , able to verbalize understanding  with redirection from staff. Emotional and mental  health status  improving . Able to maintain frustration  no anger management  concerns. Voice of no safety concerns . Working on Scientific laboratory technician. Aware of  medication received.  isolative to room  Came out voice of some one messing  With her head. Patient later came to writer stating she was getting stiff on her left side. Patient given cogentin for dystonic reaction .    A: Encourage patient participation with unit programming . Instruction  Given on  Medication , verbalize understanding. R: Voice no other concerns. Staff continue to monitor

## 2019-07-16 NOTE — Plan of Care (Signed)
Patient rated her depression 7/10 and anxiety 9/10.Stated that she is anxious about her living arrangements.Denies SI HI and AVH.Patient is visible in the milieu.Minimal interactions with staff & peers.Patient appears responding to internal stimuli at times.Compliant with medications.Appetite and energy level good.Support and encouragement given.

## 2019-07-16 NOTE — Progress Notes (Signed)
[facility] MD Progress Note  07/16/2019 9:04 AM [name]  MRN:  [id number]   Ms Trindle is a 19yo F patient with schizophrenia vs MDD with psychotic features.  Patient seen. Chart reviewed. Patient discussed with nursing; no overnight events reported. Patient was mostly isolative to her room. endorsed hearing voices and depression.Patient compliant with medications.  Subjective: Patient was seen in her room. She reports feelig "worried about two investigations they opened because of me in [name]". Reports feeling safe here in the unit, although believes that someone persecutes her outside of the hospital. She reports hearing voices occasionally, not at the time of the interview. Reports anxiety. Denies suicidal thoughts, plans. Denies physical complaints or side effects from her medications.   Principal Problem: Psychosis ([place]) Diagnosis: Principal Problem:   Psychosis ([place]) Active Problems:   Polysubstance abuse ([place])   Depressed mood   Substance induced mood disorder ([place])  Total Time spent with patient: 15 minutes  Past Psychiatric History: see H&P  Past Medical History:  Past Medical History:  Diagnosis Date  . Burning with urination 05/03/2015  . Contraceptive management 05/03/2015  . Depression   . Dysmenorrhea 12/30/2013  . Heroin addiction ([place])   . Menorrhagia 12/30/2013  . Menstrual extraction 12/30/2013  . Migraines   . Social anxiety disorder 09/25/2016  . Suicidal ideations   . Vaginal odor 05/03/2015    Past Surgical History:  Procedure Laterality Date  . NO PAST SURGERIES     Family History:  Family History  Problem Relation Age of Onset  . Depression Mother   . Hypertension Father   . Hyperlipidemia Father   . Cancer Paternal Grandmother        breast, uterine  . Cirrhosis Paternal Grandfather        due to alcohol   Family Psychiatric  History: see H&P Social History:  Social History   Substance and Sexual Activity  Alcohol Use Yes   Comment:  BAC was clear     Social History   Substance and Sexual Activity  Drug Use Yes  . Types: Marijuana, Oxycodone   Comment: reports oxycodone and marijuana    Social History   Socioeconomic History  . Marital status: Single    Spouse name: Not on file  . Number of children: Not on file  . Years of education: Not on file  . Highest education level: Not on file  Occupational History  . Occupation: Unemployed  Tobacco Use  . Smoking status: Current Every Day Smoker    Packs/day: 0.50    Types: Cigarettes  . Smokeless tobacco: Never Used  . Tobacco comment: Not ready to quit  Substance and Sexual Activity  . Alcohol use: Yes    Comment: BAC was clear  . Drug use: Yes    Types: Marijuana, Oxycodone    Comment: reports oxycodone and marijuana  . Sexual activity: Yes    Birth control/protection: Implant  Other Topics Concern  . Not on file  Social History Narrative   06/23/2019:  Pt stated that she is homeless, that she is a high school graduate, and that she is unemployed and not followed by any outpatient provider.      Lives with Dad. Mom has LGD, lives in [place]. 11th grader. Dog.   Social Determinants of Health   Financial Resource Strain:   . Difficulty of Paying Living Expenses: Not on file  Food Insecurity:   . Worried About Charity fundraiser in the Last Year: Not  on file  . Ran Out of Food in the Last Year: Not on file  Transportation Needs:   . Lack of Transportation (Medical): Not on file  . Lack of Transportation (Non-Medical): Not on file  Physical Activity:   . Days of Exercise per Week: Not on file  . Minutes of Exercise per Session: Not on file  Stress:   . Feeling of Stress : Not on file  Social Connections:   . Frequency of Communication with Friends and Family: Not on file  . Frequency of Social Gatherings with Friends and Family: Not on file  . Attends Religious Services: Not on file  . Active Member of Clubs or Organizations: Not on file  . Attends  Archivist Meetings: Not on file  . Marital Status: Not on file   Additional Social History:     Sleep: Fair  Appetite:  Fair  Current Medications: Current Facility-Administered Medications  Medication Dose Route Frequency Provider Last Rate Last Admin  . acetaminophen (TYLENOL) tablet 650 mg  650 mg Oral Q6H PRN [name], MD      . alum & mag hydroxide-simeth (MAALOX/MYLANTA) 200-200-20 MG/5ML suspension 30 mL  30 mL Oral Q4H PRN [name], MD      . benztropine (COGENTIN) tablet 2 mg  2 mg Oral BID PRN [name], MD   2 mg at 07/15/19 1611   Or  . benztropine mesylate (COGENTIN) injection 2 mg  2 mg Intramuscular BID PRN [name], MD      . diphenhydrAMINE (BENADRYL) capsule 50 mg  50 mg Oral Q6H PRN [name], MD       Or  . diphenhydrAMINE (BENADRYL) injection 50 mg  50 mg Intramuscular Q6H PRN [name], MD   50 mg at 07/15/19 1612  . FLUoxetine (PROZAC) capsule 40 mg  40 mg Oral Daily [name], MD   40 mg at 07/16/19 [id number]  . haloperidol (HALDOL) tablet 5 mg  5 mg Oral Q6H PRN [name], MD   5 mg at 07/15/19 1348   Or  . haloperidol lactate (HALDOL) injection 5 mg  5 mg Intramuscular Q6H PRN [name], MD      . magnesium hydroxide (MILK OF MAGNESIA) suspension 30 mL  30 mL Oral Daily PRN [name], MD      . paliperidone (INVEGA) 24 hr tablet 9 mg  9 mg Oral QHS [name], [name], MD   9 mg at 07/15/19 2124  . traZODone (DESYREL) tablet 50 mg  50 mg Oral QHS PRN [name], MD   50 mg at 07/15/19 2124  . ziprasidone (GEODON) injection 20 mg  20 mg Intramuscular Once [name], MD        Lab Results: No results found for this or any previous visit (from the past 48 hour(s)).  Blood Alcohol level:  Lab Results  Component Value Date   ETH <10 07/09/2019   ETH <10 [id number]    Metabolic Disorder Labs: Lab Results  Component Value Date   HGBA1C 5.2 05/17/2019   MPG 102.54  05/17/2019   MPG 108.28 02/17/2019   Lab Results  Component Value Date   PROLACTIN 31.9 (H) 02/17/2019   PROLACTIN 29.6 (H) 09/25/2016   Lab Results  Component Value Date   CHOL 144 05/17/2019   TRIG 65 05/17/2019   HDL 35 (L) 05/17/2019   CHOLHDL 4.1 05/17/2019   VLDL 13  05/17/2019   LDLCALC 96 05/17/2019   LDLCALC 76 02/17/2019    Physical Findings: AIMS:  , ,  ,  ,    CIWA:    COWS:     Musculoskeletal: Strength & Muscle Tone: within normal limits Gait & Station: normal Patient leans: N/A  Psychiatric Specialty Exam: Physical Exam   Review of Systems   Blood pressure 111/74, pulse 90, temperature 97.8 F (36.6 C), temperature source Oral, resp. rate 17, height 5\' 4"  (1.626 m), weight 67.2 kg, SpO2 99 %.Body mass index is 25.43 kg/m.  General Appearance: Casual  Eye Contact:  Fair  Speech:  Normal Rate  Volume:  Decreased  Mood:  Depressed, anxious  Affect:  Blunt  Thought Process:  Coherent  Orientation:  Full (Time, Place, and Person)  Thought Content:  Illogical, Delusions and Paranoid Ideation, auditory hallucinations.  Suicidal Thoughts:  No  Homicidal Thoughts:  No  Memory:  Immediate;   Fair Recent;   Fair  Judgement:  Other:  limited  Insight: limited  Psychomotor Activity:  Decreased  Concentration:  Concentration: Fair and Attention Span: Fair  Recall:  [employer] of Knowledge:  Good  Language:  Good  Akathisia:  No  Handed:  Right  AIMS (if indicated):     Assets:  Desire for Improvement Physical Health Resilience  ADL's:  Intact  Cognition:  Impaired,  Mild  Sleep:  Number of Hours: 7.25     Treatment Plan Summary: Daily contact with patient to assess and evaluate symptoms and progress in treatment and Medication management   Ms Supino is a 19yo F patient with schizophrenia vs MDD with psychotic features. Patient continues to feel depressed and be delusional - she reports paranoid persecutory delusions. The dose of oral  antipsychotic was increased two days ago and the next dose of LAI planned for the next week - current med combination will be continued without changes today.  Impression: Schizophrenia vs MDD with psychotic features.   Plan: -continue inpatient psych admission; 15-minute checks; daily contact with patient to assess and evaluate symptoms and progress in treatment; psychoeducation.  -continue scheduled psych medications: Paliperidone 66mh PO daily for psychosis; Lorayne Bender Suistenna 234mg  IM administered on 12/30 and the second dose is planned in 5-7 days. Prozac 40mg  PO daily for depression, anxiety.  -continue PRN medications.  -Disposition: to be determined. Patient is not ready for discharge yet.  Larita Fife, MD 07/16/2019, 9:04 AM

## 2019-07-16 NOTE — Plan of Care (Signed)
  Problem: Education: Goal: Utilization of techniques to improve thought processes will improve Outcome: Progressing Goal: Knowledge of the prescribed therapeutic regimen will improve Outcome: Progressing   Problem: Activity: Goal: Interest or engagement in leisure activities will improve Outcome: Progressing Goal: Imbalance in normal sleep/wake cycle will improve Outcome: Progressing   Problem: Coping: Goal: Coping ability will improve Outcome: Progressing

## 2019-07-16 NOTE — BHH Counselor (Signed)
CSW followed up with the patient.  Patient reports that she wants to go to an [employer] in [place].  CSW asked for back up plans and patient reports that she does not have any.    Patient reports that she was not told that she can not return to [facility], but that she doesn't want to because she was being followed by some man and felt uncomfortable.    CSW provided the patient with a list of [place] with female homes highlighted.  CSW reviewed with the patient times to call, and conversation to have with the homes.  Patient reports that she understands.  [name], MSW, LCSW 07/16/2019 10:58 AM

## 2019-07-16 NOTE — Progress Notes (Signed)
Patient continues to endorse depression anxiety. Denies SI, HI, AVH. Medications taken as prescribed. Appropriate with staff and peers. In bed resting most of shift. Did come out for snack and meds. Encouragement and support offered. Safety checks maintained. Patient receptive and remains safe on unit with q 15 min checks.

## 2019-07-17 MED ORDER — PALIPERIDONE PALMITATE ER 156 MG/ML IM SUSY: 156.0000 mg | PREFILLED_SYRINGE | Freq: Once | INTRAMUSCULAR | Status: DC

## 2019-07-17 NOTE — Progress Notes (Signed)
[facility] MD Progress Note  07/17/2019 11:46 AM Julia [name]  MRN:  [id number]   Julia [name] is a 19yo F patient with schizophrenia vs MDD with psychotic features.  Patient seen. Chart reviewed. Patient discussed with nursing; no overnight events reported. Patient presents as flat and depressed was mostly isolative to her room. endorsed hearing voices and depression.Patient compliant with medications.  She took her Invega cisterna injection on 07/13/2019.  Subjective: On evaluation today, patient has flat affect with thought blocking.  She reports, "I am doing okay."  She reports that her stalker, Julia [name] is still bothering her and describes that, "they pick at my brain like they are the [employer] and they are after [name].  I know they are not the [employer], because the [employer] is trying to help [name]."  Patient continues to be perseverative about people in her home town being against her, and she is anxious to be discharged to that setting.  She reports that she thinks she will live with her boyfriend.  Patient denies suicidal ideation, but relates she "wants to shoot myself in the foot."  She laughs and does not state a reason as to why she would do this.  She specifically denies that it would be a suicide attempt.  She is denying any homicidal ideation.  She continues to endorse auditory and visual hallucinations and is actively responding to internal stimuli during interview.  Patient reports that she has been taking medication, and thinks it has been minimally helpful.  She is denying side effects to medications.     Principal Problem: Psychosis ([place]) Diagnosis: Principal Problem:   Psychosis ([place]) Active Problems:   Polysubstance abuse ([place])   Depressed mood   Substance induced mood disorder ([place])  Total Time spent with patient: 35 minutes  Past Psychiatric History: see H&P  Past Medical History:  Past Medical History:  Diagnosis Date  . Burning with urination 05/03/2015  . Contraceptive management 05/03/2015   . Depression   . Dysmenorrhea 12/30/2013  . Heroin addiction ([place])   . Menorrhagia 12/30/2013  . Menstrual extraction 12/30/2013  . Migraines   . Social anxiety disorder 09/25/2016  . Suicidal ideations   . Vaginal odor 05/03/2015    Past Surgical History:  Procedure Laterality Date  . NO PAST SURGERIES     Family History:  Family History  Problem Relation Age of Onset  . Depression Mother   . Hypertension Father   . Hyperlipidemia Father   . Cancer Paternal Grandmother        breast, uterine  . Cirrhosis Paternal Grandfather        due to alcohol   Family Psychiatric  History: see H&P Social History:  Social History   Substance and Sexual Activity  Alcohol Use Yes   Comment: BAC was clear     Social History   Substance and Sexual Activity  Drug Use Yes  . Types: Marijuana, Oxycodone   Comment: reports oxycodone and marijuana    Social History   Socioeconomic History  . Marital status: Single    Spouse name: Not on file  . Number of children: Not on file  . Years of education: Not on file  . Highest education level: Not on file  Occupational History  . Occupation: Unemployed  Tobacco Use  . Smoking status: Current Every Day Smoker    Packs/day: 0.50    Types: Cigarettes  . Smokeless tobacco: Never Used  . Tobacco comment: Not ready to quit  Substance and  Sexual Activity  . Alcohol use: Yes    Comment: BAC was clear  . Drug use: Yes    Types: Marijuana, Oxycodone    Comment: reports oxycodone and marijuana  . Sexual activity: Yes    Birth control/protection: Implant  Other Topics Concern  . Not on file  Social History Narrative   06/23/2019:  Pt stated that she is homeless, that she is a high school graduate, and that she is unemployed and not followed by any outpatient provider.      Lives with Dad. Mom has LGD, lives in [place]. 11th grader. Dog.   Social Determinants of Health   Financial Resource Strain:   . Difficulty of Paying Living Expenses:  Not on file  Food Insecurity:   . Worried About Charity fundraiser in the Last Year: Not on file  . Ran Out of Food in the Last Year: Not on file  Transportation Needs:   . Lack of Transportation (Medical): Not on file  . Lack of Transportation (Non-Medical): Not on file  Physical Activity:   . Days of Exercise per Week: Not on file  . Minutes of Exercise per Session: Not on file  Stress:   . Feeling of Stress : Not on file  Social Connections:   . Frequency of Communication with Friends and Family: Not on file  . Frequency of Social Gatherings with Friends and Family: Not on file  . Attends Religious Services: Not on file  . Active Member of Clubs or Organizations: Not on file  . Attends Archivist Meetings: Not on file  . Marital Status: Not on file   Additional Social History:     Sleep: Fair  Appetite:  Fair  Current Medications: Current Facility-Administered Medications  Medication Dose Route Frequency Provider Last Rate Last Admin  . acetaminophen (TYLENOL) tablet 650 mg  650 mg Oral Q6H PRN [name], MD      . alum & mag hydroxide-simeth (MAALOX/MYLANTA) 200-200-20 MG/5ML suspension 30 mL  30 mL Oral Q4H PRN [name], MD      . benztropine (COGENTIN) tablet 2 mg  2 mg Oral BID PRN [name], MD   2 mg at 07/15/19 1611   Or  . benztropine mesylate (COGENTIN) injection 2 mg  2 mg Intramuscular BID PRN [name], MD      . diphenhydrAMINE (BENADRYL) capsule 50 mg  50 mg Oral Q6H PRN [name], MD       Or  . diphenhydrAMINE (BENADRYL) injection 50 mg  50 mg Intramuscular Q6H PRN [name], MD   50 mg at 07/15/19 1612  . FLUoxetine (PROZAC) capsule 40 mg  40 mg Oral Daily [name], MD   40 mg at 07/17/19 0801  . haloperidol (HALDOL) tablet 5 mg  5 mg Oral Q6H PRN [name], MD   5 mg at 07/17/19 1110   Or  . haloperidol lactate (HALDOL) injection 5 mg  5 mg Intramuscular Q6H PRN [name], MD       . magnesium hydroxide (MILK OF MAGNESIA) suspension 30 mL  30 mL Oral Daily PRN [name], MD      . paliperidone (INVEGA) 24 hr tablet 9 mg  9 mg Oral QHS [name], [name], MD   9 mg at 07/16/19 2123  . traZODone (DESYREL) tablet 50 mg  50 mg Oral QHS PRN [name], MD   50 mg at  07/16/19 2123  . ziprasidone (GEODON) injection 20 mg  20 mg Intramuscular Once [name], MD        Lab Results: No results found for this or any previous visit (from the past 48 hour(s)).  Blood Alcohol level:  Lab Results  Component Value Date   ETH <10 07/09/2019   ETH <10 [id number]    Metabolic Disorder Labs: Lab Results  Component Value Date   HGBA1C 5.2 05/17/2019   MPG 102.54 05/17/2019   MPG 108.28 02/17/2019   Lab Results  Component Value Date   PROLACTIN 31.9 (H) 02/17/2019   PROLACTIN 29.6 (H) 09/25/2016   Lab Results  Component Value Date   CHOL 144 05/17/2019   TRIG 65 05/17/2019   HDL 35 (L) 05/17/2019   CHOLHDL 4.1 05/17/2019   VLDL 13 05/17/2019   LDLCALC 96 05/17/2019   LDLCALC 76 02/17/2019    Physical Findings: AIMS:  , ,  ,  ,    CIWA:    COWS:     Musculoskeletal: Strength & Muscle Tone: within normal limits Gait & Station: normal Patient leans: N/A  Psychiatric Specialty Exam: Physical Exam  Nursing note and vitals reviewed. Constitutional: She is oriented to person, place, and time. She appears well-developed and well-nourished. No distress.  HENT:  Head: Normocephalic and atraumatic.  Eyes: EOM are normal.  Cardiovascular: Normal rate and regular rhythm.  Respiratory: She is in respiratory distress.  Musculoskeletal:        General: Normal range of motion.     Cervical back: Normal range of motion.  Neurological: She is alert and oriented to person, place, and time.  Skin: Skin is warm and dry.  Psychiatric: Her mood appears anxious. Her affect is blunt and inappropriate. Her speech is delayed and tangential. She is slowed. Thought  content is paranoid and delusional. Cognition and memory are impaired. She expresses inappropriate judgment. She exhibits a depressed mood. She expresses suicidal (with thoughts of self harm) ideation. She expresses no homicidal ideation. She expresses suicidal plans. She expresses no homicidal plans. She is inattentive.    Review of Systems  Constitutional: Negative.   Respiratory: Negative.   Cardiovascular: Negative.   Musculoskeletal: Negative.   Neurological: Negative.   Psychiatric/Behavioral: Positive for confusion, dysphoric mood and hallucinations. The patient is nervous/anxious.     Blood pressure 105/72, pulse 93, temperature 98.6 F (37 C), temperature source Oral, resp. rate 18, height 5\' 4"  (1.626 m), weight 67.2 kg, SpO2 99 %.Body mass index is 25.43 kg/m.  General Appearance: Bizarre, Fairly Groomed and Guarded  Eye Contact:  Minimal  Speech:  Blocked and Slow  Volume:  Decreased  Mood:  Anxious, Depressed and Hopeless, anxious  Affect:  Blunt  Thought Process:  Disorganized, Irrelevant and Descriptions of Associations: Tangential  Orientation:  Full (Time, Place, and Person)  Thought Content:  Illogical, Delusions, Hallucinations: Auditory Visual and Paranoid Ideation  Suicidal Thoughts:  No, although expresses thoughts to shoot herself in the foot.  Homicidal Thoughts:  No  Memory:  Immediate;   Fair Recent;   Fair  Judgement:  Impaired  Insight: Poor  Psychomotor Activity:  Decreased  Concentration:  Concentration: Fair and Attention Span: Fair  Recall:  [employer] of Knowledge:  Fair  Language:  Good  Akathisia:  No  Handed:  Right  AIMS (if indicated):   0, no tremor  Assets:  Physical Health Resilience  ADL's:  Intact  Cognition:  Impaired,  Mild  Sleep:  Number of Hours: 7.25     Treatment Plan Summary: Daily contact with patient to assess and evaluate symptoms and progress in treatment and Medication management   Julia Kubit is a 19yo F patient  with schizophrenia vs MDD with psychotic features. Patient continues to feel depressed and be delusional - she reports paranoid persecutory delusions. The dose of oral antipsychotic was increased two days ago and the next dose of LAI planned for the next week, will reevaluate tomorrow discuss with patient tomorrow after reevaluation giving second dose of LAI on 07/19/2019 if patient is still struggling with hallucinations and delusions.- current med combination will be continued without changes today.  Impression: Schizophrenia vs MDD with psychotic features.   Plan: -continue inpatient psych admission; 15-minute checks; daily contact with patient to assess and evaluate symptoms and progress in treatment; psychoeducation.  -continue scheduled psych medications: Paliperidone 9mg  PO daily for psychosis; Lorayne Bender Sustenna 234mg  IM administered on 12/30 and the second dose is planned in 5-7 days. Prozac 40mg  PO daily for depression, anxiety.  -continue PRN medications.  -Disposition: to be determined. Patient is not ready for discharge yet.  [name], MD 07/17/2019, 11:46 AM

## 2019-07-17 NOTE — Plan of Care (Signed)
Patient oriented to unit. Patient's safety is maintained on unit. Patient denies SI/HI/AVH.  Patient reports improving mood. Patient is minimal during interview. Patient is flat in affect and mildly depressed. Patient reports 4/10 depression. Reports anxiety is 0/10.    Problem: Education: Goal: Utilization of techniques to improve thought processes will improve Outcome: Not Progressing Goal: Knowledge of the prescribed therapeutic regimen will improve Outcome: Not Progressing   Problem: Activity: Goal: Interest or engagement in leisure activities will improve Outcome: Not Progressing

## 2019-07-17 NOTE — Progress Notes (Signed)
CSW spoke with pt regarding progress in calling [facility]?  Pt said she has changed her mind and is not pursuing this. Pt said she spoke with her boyfriend in [place] and he has said she can come and stay with him.  She has been there before, has been to [facility] in [place] before, willing to follow up there again.  Pt signed ROI for [facility]. [name], MSW, LCSW Advanced Care Supervisor 07/17/2019 10:41 AM

## 2019-07-17 NOTE — Tx Team (Signed)
Interdisciplinary Treatment and Diagnostic Plan Update  07/17/2019 Time of Session: 9:02AM Julia [name] MRN: [id number]  Principal Diagnosis: Psychosis [facility])  Secondary Diagnoses: Principal Problem:   Psychosis ([place]) Active Problems:   Polysubstance abuse ([place])   Depressed mood   Substance induced mood disorder ([place])   Current Medications:  Current Facility-Administered Medications  Medication Dose Route Frequency Provider Last Rate Last Admin  . acetaminophen (TYLENOL) tablet 650 mg  650 mg Oral Q6H PRN [name], MD      . alum & mag hydroxide-simeth (MAALOX/MYLANTA) 200-200-20 MG/5ML suspension 30 mL  30 mL Oral Q4H PRN [name], MD      . benztropine (COGENTIN) tablet 2 mg  2 mg Oral BID PRN [name], MD   2 mg at 07/15/19 1611   Or  . benztropine mesylate (COGENTIN) injection 2 mg  2 mg Intramuscular BID PRN [name], MD      . diphenhydrAMINE (BENADRYL) capsule 50 mg  50 mg Oral Q6H PRN [name], MD       Or  . diphenhydrAMINE (BENADRYL) injection 50 mg  50 mg Intramuscular Q6H PRN [name], MD   50 mg at 07/15/19 1612  . FLUoxetine (PROZAC) capsule 40 mg  40 mg Oral Daily [name], MD   40 mg at 07/17/19 0801  . haloperidol (HALDOL) tablet 5 mg  5 mg Oral Q6H PRN [name], MD   5 mg at 07/15/19 1348   Or  . haloperidol lactate (HALDOL) injection 5 mg  5 mg Intramuscular Q6H PRN [name], MD      . magnesium hydroxide (MILK OF MAGNESIA) suspension 30 mL  30 mL Oral Daily PRN [name], MD      . paliperidone (INVEGA) 24 hr tablet 9 mg  9 mg Oral QHS [name], [name], MD   9 mg at 07/16/19 2123  . traZODone (DESYREL) tablet 50 mg  50 mg Oral QHS PRN [name], MD   50 mg at 07/16/19 2123  . ziprasidone (GEODON) injection 20 mg  20 mg Intramuscular Once [name], MD       PTA Medications: Medications Prior to Admission  Medication Sig Dispense Refill Last Dose  . etonogestrel  (NEXPLANON) 68 MG IMPL implant 1 each (68 mg total) by Subdermal route once for 1 dose. (Birth control method) 1 each 0   . FLUoxetine (PROZAC) 10 MG capsule Take 3 capsules (30 mg total) by mouth daily. 90 capsule 1   . hydrOXYzine (ATARAX/VISTARIL) 25 MG tablet Take 1 tablet (25 mg total) by mouth every 6 (six) hours as needed for anxiety. 60 tablet 1   . QUEtiapine (SEROQUEL) 50 MG tablet Take 1 tablet (50 mg total) by mouth daily. 30 tablet 1   . traZODone (DESYREL) 50 MG tablet Take 50 mg by mouth at bedtime as needed. for sleep       Patient Stressors: Financial difficulties Loss of Mother Marital or family conflict Substance abuse  Patient Strengths: Ability for insight Communication skills  Treatment Modalities: Medication Management, Group therapy, Case management,  1 to 1 session with clinician, Psychoeducation, Recreational therapy.   Physician Treatment Plan for Primary Diagnosis: Psychosis [facility]) Long Term Goal(s): Improvement in symptoms so as ready for discharge Improvement in symptoms so as ready for discharge   Short Term Goals: Ability to verbalize feelings will improve Ability to disclose and discuss suicidal ideas Ability to demonstrate  self-control will improve Ability to identify and develop effective coping behaviors will improve Compliance with prescribed medications will improve  Medication Management: Evaluate patient's response, side effects, and tolerance of medication regimen.  Therapeutic Interventions: 1 to 1 sessions, Unit Group sessions and Medication administration.  Evaluation of Outcomes: Progressing  Physician Treatment Plan for Secondary Diagnosis: Principal Problem:   Psychosis ([place]) Active Problems:   Polysubstance abuse ([place])   Depressed mood   Substance induced mood disorder ([place])  Long Term Goal(s): Improvement in symptoms so as ready for discharge Improvement in symptoms so as ready for discharge   Short Term Goals: Ability to  verbalize feelings will improve Ability to disclose and discuss suicidal ideas Ability to demonstrate self-control will improve Ability to identify and develop effective coping behaviors will improve Compliance with prescribed medications will improve     Medication Management: Evaluate patient's response, side effects, and tolerance of medication regimen.  Therapeutic Interventions: 1 to 1 sessions, Unit Group sessions and Medication administration.  Evaluation of Outcomes: Progressing   RN Treatment Plan for Primary Diagnosis: Psychosis ([place]) Long Term Goal(s): Knowledge of disease and therapeutic regimen to maintain health will improve  Short Term Goals: Ability to demonstrate self-control, Ability to participate in decision making will improve, Ability to verbalize feelings will improve, Ability to disclose and discuss suicidal ideas, Ability to identify and develop effective coping behaviors will improve and Compliance with prescribed medications will improve  Medication Management: RN will administer medications as ordered by provider, will assess and evaluate patient's response and provide education to patient for prescribed medication. RN will report any adverse and/or side effects to prescribing provider.  Therapeutic Interventions: 1 on 1 counseling sessions, Psychoeducation, Medication administration, Evaluate responses to treatment, Monitor vital signs and CBGs as ordered, Perform/monitor CIWA, COWS, AIMS and Fall Risk screenings as ordered, Perform wound care treatments as ordered.  Evaluation of Outcomes: Progressing   LCSW Treatment Plan for Primary Diagnosis: Psychosis ([place]) Long Term Goal(s): Safe transition to appropriate next level of care at discharge, Engage patient in therapeutic group addressing interpersonal concerns.  Short Term Goals: Engage patient in aftercare planning with referrals and resources, Increase social support, Increase ability to appropriately  verbalize feelings, Increase emotional regulation, Facilitate acceptance of mental health diagnosis and concerns and Facilitate patient progression through stages of change regarding substance use diagnoses and concerns  Therapeutic Interventions: Assess for all discharge needs, 1 to 1 time with Social worker, Explore available resources and support systems, Assess for adequacy in community support network, Educate family and significant other(s) on suicide prevention, Complete Psychosocial Assessment, Interpersonal group therapy.  Evaluation of Outcomes: Progressing   Progress in Treatment: Attending groups: No Participating in groups: No. Taking medication as prescribed: Yes. Toleration medication: Yes. Family/Significant other contact made: No, will contact:  pt declined. Patient understands diagnosis: Yes. Discussing patient identified problems/goals with staff: Yes. Medical problems stabilized or resolved: Yes. Denies suicidal/homicidal ideation: Yes. Issues/concerns per patient self-inventory: No. Other: none  New problem(s) identified: No, Describe:  none  New Short Term/Long Term Goal(s):  elimination of symptoms of psychosis, medication management for mood stabilization; elimination of SI thoughts; development of comprehensive mental wellness/sobriety plan.  Patient Goals:  "to have a plan"  Discharge Plan or Barriers: Patient is currently homeless.  Patient aftercare depends on where patient will be staying. CSW is attempting to identify an available bed at local homeless shelters.  Patient reports that she [facility].  Reason for Continuation of  Hospitalization: Anxiety Depression Hallucinations Medication stabilization  Estimated Length of Stay: 2-4 days  Attendees: Patient: 07/17/2019   Physician: Dr. Leverne Humbles, MD 07/17/2019   Nursing: [place] Nurse, RN 07/17/2019   RN Care Manager: 07/17/2019   Social Worker: Lurline Idol, LCSW 07/17/2019    Recreational Therapist:  07/17/2019   Other:  07/17/2019   Other:  07/17/2019   Other: 07/17/2019        Scribe for Treatment Team: Joanne Chars, LCSW 07/17/2019 9:20 AM

## 2019-07-17 NOTE — [facility] Group Notes (Signed)
LCSW Aftercare Discharge Planning Group Note   07/17/2019 1230pm  Type of Group and Topic: Psychoeducational Group:  Discharge Planning  Participation Level:  None  Description of Group  Discharge planning group reviews patient's anticipated discharge plans and assists patients to anticipate and address any barriers to wellness/recovery in the community.  Suicide prevention education is reviewed with patients in group.  Therapeutic Goals 1. Patients will state their anticipated discharge plan and mental health aftercare 2. Patients will identify potential barriers to wellness in the community setting 3. Patients will engage in problem solving, solution focused discussion of ways to anticipate and address barriers to wellness/recovery  Summary of Patient Progress: Pt came to group but left within the first 5 minutes and did not return.     Plan for Discharge/Comments:    Transportation Means:   Supports:  Therapeutic Modalities: Motivational Interviewing    Joanne Chars, LCSW 07/17/2019 1:40 PM

## 2019-07-17 NOTE — [facility] Group Notes (Signed)
[facility] Group Notes:  (Nursing/MHT/Case Management/Adjunct)  Date:  07/17/2019  Time:  8:43 PM  Type of Therapy:  Group Therapy  Participation Level:  Active  Participation Quality:  Appropriate  Affect:  Appropriate  Cognitive:  Alert  Insight:  Good  Engagement in Group:  Engaged  Modes of Intervention:  Support  Summary of Progress/Problems:  Julia [name] 07/17/2019, 8:43 PM

## 2019-07-18 MED ORDER — OLANZAPINE 5 MG PO TABS
5.0000 mg | ORAL_TABLET | Freq: Every day | ORAL | Status: DC
  Administered 2019-07-18: 5 mg via ORAL
  Filled 2019-07-18: qty 1

## 2019-07-18 MED ORDER — PALIPERIDONE PALMITATE ER 156 MG/ML IM SUSY
156.0000 mg | PREFILLED_SYRINGE | Freq: Once | INTRAMUSCULAR | Status: AC
  Administered 2019-07-21: 11:00:00 156 mg via INTRAMUSCULAR
  Filled 2019-07-18 (×2): qty 1

## 2019-07-18 MED ORDER — OLANZAPINE 5 MG PO TABS
2.5000 mg | ORAL_TABLET | Freq: Once | ORAL | Status: AC
  Administered 2019-07-18: 2.5 mg via ORAL
  Filled 2019-07-18: qty 1

## 2019-07-18 NOTE — Plan of Care (Signed)
  Problem: Coping: Goal: Will verbalize feelings Outcome: Progressing   Problem: Self-Concept: Goal: Level of anxiety will decrease Outcome: Not Progressing

## 2019-07-18 NOTE — Progress Notes (Signed)
[facility] MD Progress Note  07/18/2019 9:46 AM Julia [name]  MRN:  [id number]   Julia [name] is a 19yo F patient with schizophrenia vs MDD with psychotic features.  Patient seen. Chart reviewed. Patient discussed with nursing; no overnight events reported. Patient continues to be seen in hallway smiling and laughing to herself.  She remains paranoid, however is compliant with medications.Patient compliant with medications.  She received [place] injection on 07/14/2019. Patient received Zyprexa 2.5 mg p.o. this morning due to ongoing auditory and visual hallucinations as well as delusions of people picking at her brain.  Per nursing, patient has seemed calmer and not responding to internal stimuli.  She has been able to sit in the day room today, previously had been isolating herself to her room.  She has not been seen to be smiling and laughing inappropriately since taking the Zyprexa.  Subjective: "I have been hearing and seeing the devil saying, get a load of this bitch.  I am not certain what he is referring to."  On evaluation today, patient has flat affect with thought blocking.  She states she has continued to be tormented by the brain picker, tesla and 1 other man whom she is not certain his name.  She believes that these people have been sent to do the work of her stalker, Julia [name].  Patient states that she slept well and is "feeling a little better than yesterday, when she was wanting to shoot herself in the foot in order to distract herself from the people picking at her brain."  Julia [name] states that she feels safe in the hospital, and relates that Prozac is helping with her anxiety. Patient denies any suicidal ideation, plan or intent.  She denies any homicidal ideation.  Patient is requesting medication that could help her and her hallucinations.  She is agreeable to trial of 2.5 mg oral Zyprexa.  After dinner, patient is reassessed.  She is sitting calmly in the day room where she has been the  majority of the day.  She still does exhibit some thought blocking, but is able to note that her thoughts are better.  She does state she has had a slight headache which she believes is a reaction to the medication, however she expresses a desire to retry Zyprexa again this evening.    Principal Problem: Psychosis ([place]) Diagnosis: Principal Problem:   Psychosis ([place]) Active Problems:   Polysubstance abuse ([place])   Depressed mood   Substance induced mood disorder ([place])  Total Time spent with patient: 40 minutes  Past Psychiatric History: see H&P  Past Medical History:  Past Medical History:  Diagnosis Date  . Burning with urination 05/03/2015  . Contraceptive management 05/03/2015  . Depression   . Dysmenorrhea 12/30/2013  . Heroin addiction ([place])   . Menorrhagia 12/30/2013  . Menstrual extraction 12/30/2013  . Migraines   . Social anxiety disorder 09/25/2016  . Suicidal ideations   . Vaginal odor 05/03/2015    Past Surgical History:  Procedure Laterality Date  . NO PAST SURGERIES     Family History:  Family History  Problem Relation Age of Onset  . Depression Mother   . Hypertension Father   . Hyperlipidemia Father   . Cancer Paternal Grandmother        breast, uterine  . Cirrhosis Paternal Grandfather        due to alcohol   Family Psychiatric  History: see H&P Social History:  Social History   Substance and Sexual Activity  Alcohol Use Yes   Comment: BAC was clear     Social History   Substance and Sexual Activity  Drug Use Yes  . Types: Marijuana, Oxycodone   Comment: reports oxycodone and marijuana    Social History   Socioeconomic History  . Marital status: Single    Spouse name: Not on file  . Number of children: Not on file  . Years of education: Not on file  . Highest education level: Not on file  Occupational History  . Occupation: Unemployed  Tobacco Use  . Smoking status: Current Every Day Smoker    Packs/day: 0.50    Types: Cigarettes   . Smokeless tobacco: Never Used  . Tobacco comment: Not ready to quit  Substance and Sexual Activity  . Alcohol use: Yes    Comment: BAC was clear  . Drug use: Yes    Types: Marijuana, Oxycodone    Comment: reports oxycodone and marijuana  . Sexual activity: Yes    Birth control/protection: Implant  Other Topics Concern  . Not on file  Social History Narrative   06/23/2019:  Pt stated that she is homeless, that she is a high school graduate, and that she is unemployed and not followed by any outpatient provider.      Lives with Dad. Mom has LGD, lives in [place]. 11th grader. Dog.   Social Determinants of Health   Financial Resource Strain:   . Difficulty of Paying Living Expenses: Not on file  Food Insecurity:   . Worried About Charity fundraiser in the Last Year: Not on file  . Ran Out of Food in the Last Year: Not on file  Transportation Needs:   . Lack of Transportation (Medical): Not on file  . Lack of Transportation (Non-Medical): Not on file  Physical Activity:   . Days of Exercise per Week: Not on file  . Minutes of Exercise per Session: Not on file  Stress:   . Feeling of Stress : Not on file  Social Connections:   . Frequency of Communication with Friends and Family: Not on file  . Frequency of Social Gatherings with Friends and Family: Not on file  . Attends Religious Services: Not on file  . Active Member of Clubs or Organizations: Not on file  . Attends Archivist Meetings: Not on file  . Marital Status: Not on file   Additional Social History:     Sleep: Good  Appetite:  Fair  Current Medications: Current Facility-Administered Medications  Medication Dose Route Frequency Provider Last Rate Last Admin  . acetaminophen (TYLENOL) tablet 650 mg  650 mg Oral Q6H PRN [name], MD      . alum & mag hydroxide-simeth (MAALOX/MYLANTA) 200-200-20 MG/5ML suspension 30 mL  30 mL Oral Q4H PRN [name], MD      . benztropine (COGENTIN) tablet  2 mg  2 mg Oral BID PRN [name], MD   2 mg at 07/15/19 1611   Or  . benztropine mesylate (COGENTIN) injection 2 mg  2 mg Intramuscular BID PRN [name], MD      . diphenhydrAMINE (BENADRYL) capsule 50 mg  50 mg Oral Q6H PRN [name], MD       Or  . diphenhydrAMINE (BENADRYL) injection 50 mg  50 mg Intramuscular Q6H PRN [name], MD   50 mg at 07/15/19 1612  . FLUoxetine (PROZAC) capsule 40 mg  40 mg Oral Daily [name],  MD   40 mg at 07/18/19 0750  . haloperidol (HALDOL) tablet 5 mg  5 mg Oral Q6H PRN [name], MD   5 mg at 07/17/19 1110   Or  . haloperidol lactate (HALDOL) injection 5 mg  5 mg Intramuscular Q6H PRN [name], MD      . magnesium hydroxide (MILK OF MAGNESIA) suspension 30 mL  30 mL Oral Daily PRN [name], MD      . Derrill Memo ON 07/19/2019] paliperidone (INVEGA SUSTENNA) injection 156 mg  156 mg Intramuscular Once [name], MD      . paliperidone (INVEGA) 24 hr tablet 9 mg  9 mg Oral QHS [name], [name], MD   9 mg at 07/17/19 2122  . traZODone (DESYREL) tablet 50 mg  50 mg Oral QHS PRN [name], MD   50 mg at 07/17/19 2126  . ziprasidone (GEODON) injection 20 mg  20 mg Intramuscular Once [name], MD        Lab Results: No results found for this or any previous visit (from the past 48 hour(s)).  Blood Alcohol level:  Lab Results  Component Value Date   ETH <10 07/09/2019   ETH <10 [id number]    Metabolic Disorder Labs: Lab Results  Component Value Date   HGBA1C 5.2 05/17/2019   MPG 102.54 05/17/2019   MPG 108.28 02/17/2019   Lab Results  Component Value Date   PROLACTIN 31.9 (H) 02/17/2019   PROLACTIN 29.6 (H) 09/25/2016   Lab Results  Component Value Date   CHOL 144 05/17/2019   TRIG 65 05/17/2019   HDL 35 (L) 05/17/2019   CHOLHDL 4.1 05/17/2019   VLDL 13 05/17/2019   LDLCALC 96 05/17/2019   LDLCALC 76 02/17/2019    Physical Findings: AIMS:  , ,  ,  ,    CIWA:     COWS:     Musculoskeletal: Strength & Muscle Tone: within normal limits Gait & Station: normal Patient leans: N/A  Psychiatric Specialty Exam: Physical Exam  Nursing note and vitals reviewed. Constitutional: She is oriented to person, place, and time. She appears well-developed and well-nourished. No distress.  HENT:  Head: Normocephalic and atraumatic.  Eyes: EOM are normal.  Cardiovascular: Normal rate and regular rhythm.  Respiratory: Effort normal. No respiratory distress.  Musculoskeletal:        General: Normal range of motion.     Cervical back: Normal range of motion.  Neurological: She is alert and oriented to person, place, and time.  Skin: Skin is warm and dry.  Psychiatric: Her mood appears anxious. Her affect is blunt and inappropriate. Her speech is delayed and tangential. She is slowed. Thought content is paranoid and delusional. Cognition and memory are impaired. She expresses inappropriate judgment. She exhibits a depressed mood. She expresses no homicidal and no suicidal ideation. She expresses no homicidal plans. She is inattentive.    Review of Systems  Constitutional: Negative.   Respiratory: Negative.   Cardiovascular: Negative.   Musculoskeletal: Negative.   Neurological: Negative.   Psychiatric/Behavioral: Positive for confusion, dysphoric mood and hallucinations. The patient is nervous/anxious.     Blood pressure 107/66, pulse (!) 119, temperature 98.7 F (37.1 C), temperature source Oral, resp. rate 18, height 5\' 4"  (1.626 m), weight 67.2 kg, SpO2 99 %.Body mass index is 25.43 kg/m.  General Appearance: Bizarre, Fairly Groomed and Guarded  Eye Contact:  Minimal  Speech:  Blocked and Slow  Volume:  Decreased  Mood:  Anxious, Depressed and Hopeless, anxious  Affect:  Blunt  Thought Process:  Disorganized, Irrelevant and Descriptions of Associations: Tangential  Orientation:  Full (Time, Place, and Person)  Thought Content:  Illogical, Delusions,  Hallucinations: Auditory Visual and Paranoid Ideation  Suicidal Thoughts:  No, although expresses thoughts to shoot herself in the foot.  Homicidal Thoughts:  No  Memory:  Immediate;   Fair Recent;   Fair  Judgement:  Impaired  Insight: Poor  Psychomotor Activity:  Decreased  Concentration:  Concentration: Fair and Attention Span: Fair  Recall:  [employer] of Knowledge:  Fair  Language:  Good  Akathisia:  No  Handed:  Right  AIMS (if indicated):   0, no tremor  Assets:  Physical Health Resilience  ADL's:  Intact  Cognition:  Impaired,  Mild  Sleep:  Number of Hours: 8     Treatment Plan Summary: Daily contact with patient to assess and evaluate symptoms and progress in treatment and Medication management   Julia Soden is a 19yo F patient with schizophrenia vs MDD with psychotic features. Patient continues to feel depressed and be delusional - she reports paranoid persecutory delusions.  Patient benefited from 2.5 mg of oral Zyprexa today, and expresses desire to continue.  Will discontinue paliperidone 9 mg at night and start Zyprexa 5 mg at bedtime, and reevaluate in morning for relief from AVH and persecutory delusions.   Patient did receive first [place] injection on 08/14/2019.  Will place order for second dose to be given 7 days after initial dose while continuing to monitor for effectiveness of Zyprexa at bedtime.  Impression: Schizophrenia vs MDD with psychotic features.   Plan: -continue inpatient psych admission; 15-minute checks; daily contact with patient to assess and evaluate symptoms and progress in treatment; psychoeducation.  -continue scheduled psych medications: Discontinue paliperidone 9mg  PO daily for psychosis Change to Zyprexa 5 mg at bedtime each night for psychosis Invega Sustenna 234mg  IM administered on 12/30 and the second dose is planned in 7 days.  (Order placed) Prozac 40mg  PO daily for depression, anxiety.  -continue PRN  medications.  -Disposition: to be determined. Patient is not ready for discharge yet.   [name], MD 07/18/2019, 9:46 AM

## 2019-07-18 NOTE — Plan of Care (Signed)
D- Patient alert and oriented. Patient presents in a pleasant mood on assessment stating that she slept good last night and had no complaints to voice to this Probation officer. Patient denies SI, HI, AVH, and pain at this time. Patient also denies depression/anxiety, stating that she's "feeling ok". Patient's goal for today is "discharge plan".  A- Scheduled medications administered to patient, per MD orders. Support and encouragement provided.  Routine safety checks conducted every 15 minutes.  Patient informed to notify staff with problems or concerns.  R- No adverse drug reactions noted. Patient contracts for safety at this time. Patient compliant with medications and treatment plan. Patient receptive, calm, and cooperative. Patient interacts well with others on the unit.  Patient remains safe at this time.  Problem: Education: Goal: Utilization of techniques to improve thought processes will improve Outcome: Progressing Goal: Knowledge of the prescribed therapeutic regimen will improve Outcome: Progressing   Problem: Activity: Goal: Interest or engagement in leisure activities will improve Outcome: Progressing Goal: Imbalance in normal sleep/wake cycle will improve Outcome: Progressing   Problem: Coping: Goal: Coping ability will improve Outcome: Progressing Goal: Will verbalize feelings Outcome: Progressing   Problem: Health Behavior/Discharge Planning: Goal: Ability to make decisions will improve Outcome: Progressing Goal: Compliance with therapeutic regimen will improve Outcome: Progressing   Problem: Role Relationship: Goal: Will demonstrate positive changes in social behaviors and relationships Outcome: Progressing   Problem: Safety: Goal: Ability to disclose and discuss suicidal ideas will improve Outcome: Progressing Goal: Ability to identify and utilize support systems that promote safety will improve Outcome: Progressing   Problem: Self-Concept: Goal: Will verbalize  positive feelings about self Outcome: Progressing Goal: Level of anxiety will decrease Outcome: Progressing   Problem: Education: Goal: Knowledge of the prescribed therapeutic regimen will improve Outcome: Progressing   Problem: Coping: Goal: Coping ability will improve Outcome: Progressing   Problem: Education: Goal: Knowledge of [place] [facility] Education information/materials will improve Outcome: Progressing Goal: Emotional status will improve Outcome: Progressing

## 2019-07-18 NOTE — Progress Notes (Signed)
Patient was observed walking back to her room smiling to herself.

## 2019-07-18 NOTE — Plan of Care (Signed)
Patient stayed in the milieu, cooperative and pleasant. Getting along with peers. Denying thoughts of self harm. Denying hallucinations.  Participated in group activities. Had no concerns. Received medications and snack and went to bed. Currently sleeping. No sign of distress. Safety monitored as recommended.

## 2019-07-18 NOTE — [facility] Group Notes (Signed)
LCSW Group Therapy Note 07/18/2019 1:15pm  Type of Therapy and Topic: Group Therapy: Feelings Around Returning Home & Establishing a Supportive Framework and Supporting Oneself When Supports Not Available  Participation Level: Did Not Attend  Description of Group:  Patients first processed thoughts and feelings about upcoming discharge. These included fears of upcoming changes, lack of change, new living environments, judgements and expectations from others and overall stigma of mental health issues. The group then discussed the definition of a supportive framework, what that looks and feels like, and how do to discern it from an unhealthy non-supportive network. The group identified different types of supports as well as what to do when your family/friends are less than helpful or unavailable  Therapeutic Goals  1. Patient will identify one healthy supportive network that they can use at discharge. 2. Patient will identify one factor of a supportive framework and how to tell it from an unhealthy network. 3. Patient able to identify one coping skill to use when they do not have positive supports from others. 4. Patient will demonstrate ability to communicate their needs through discussion and/or role plays.  Summary of Patient Progress:  Pt was invited to attend group but chose not to attend. CSW will continue to encourage pt to attend group throughout their admission.   Therapeutic Modalities Cognitive Behavioral Therapy Motivational Interviewing   Irfan Veal  CUEBAS-COLON, LCSW 07/18/2019 12:50 PM

## 2019-07-18 NOTE — Progress Notes (Signed)
Patient noted early shift pacing with distressed look. Patient then went to room and yelled. Writer went to assess, patient tearful reports Fredda Hammed leave her alone. Reports Rodman Key is someone on the outside that she was told has been chasing her since she was 4. Reports she was on a tv show and he was there. Patient continues to cry. Prn given with good relief. Trazadone given for sleep. Pt currently in bed resting in no distress.  Encouragement and support offered. Safety checks maintained. Pt receptive and remains safe on unit with q 15 min checks.

## 2019-07-19 MED ORDER — OLANZAPINE 5 MG PO TBDP
5.0000 mg | ORAL_TABLET | ORAL | Status: AC
  Administered 2019-07-19: 5 mg via ORAL
  Filled 2019-07-19: qty 1

## 2019-07-19 MED ORDER — OLANZAPINE 5 MG PO TBDP
10.0000 mg | ORAL_TABLET | Freq: Every day | ORAL | Status: DC
  Administered 2019-07-19: 10 mg via ORAL
  Filled 2019-07-19: qty 2

## 2019-07-19 NOTE — Progress Notes (Signed)
CSW attempted to contact pts bf, Marcie Bal [phone], no answer and voicemail is not set up. CSW will attempt again later.   Evalina Field, MSW, LCSW Clinical Social Work 07/19/2019 9:17 AM

## 2019-07-19 NOTE — Progress Notes (Signed)
Recreation Therapy Notes  Date: 07/18/2018  Time: 9:30 am   Location: Craft room   Behavioral response: N/A   Intervention Topic: Relaxation    Discussion/Intervention: Patient did not attend group.   Clinical Observations/Feedback:  Patient did not attend group.   Chrystina Naff LRT/CTRS          Rayfield Beem 07/19/2019 11:17 AM
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
## 2019-07-19 NOTE — Plan of Care (Signed)
D- Patient alert and oriented. Patient presents in a pleasant mood on assessment stating that she slept ok last night and had no complaints to voice to this Probation officer. Patient denies depression/anxiety, stating that she is "feeling good". Patient also denies SI, HI, AVH, and pain at this time. Patient had no stated goals for today.  A- Scheduled medications administered to patient, per MD orders. Support and encouragement provided.  Routine safety checks conducted every 15 minutes.  Patient informed to notify staff with problems or concerns.  R- No adverse drug reactions noted. Patient contracts for safety at this time. Patient compliant with medications and treatment plan. Patient receptive, calm, and cooperative. Patient interacts well with others on the unit.  Patient remains safe at this time.  Problem: Education: Goal: Utilization of techniques to improve thought processes will improve Outcome: Progressing Goal: Knowledge of the prescribed therapeutic regimen will improve Outcome: Progressing   Problem: Activity: Goal: Interest or engagement in leisure activities will improve Outcome: Progressing Goal: Imbalance in normal sleep/wake cycle will improve Outcome: Progressing   Problem: Coping: Goal: Coping ability will improve Outcome: Progressing Goal: Will verbalize feelings Outcome: Progressing   Problem: Health Behavior/Discharge Planning: Goal: Ability to make decisions will improve Outcome: Progressing Goal: Compliance with therapeutic regimen will improve Outcome: Progressing   Problem: Role Relationship: Goal: Will demonstrate positive changes in social behaviors and relationships Outcome: Progressing   Problem: Safety: Goal: Ability to disclose and discuss suicidal ideas will improve Outcome: Progressing Goal: Ability to identify and utilize support systems that promote safety will improve Outcome: Progressing   Problem: Self-Concept: Goal: Will verbalize positive  feelings about self Outcome: Progressing Goal: Level of anxiety will decrease Outcome: Progressing   Problem: Education: Goal: Knowledge of the prescribed therapeutic regimen will improve Outcome: Progressing   Problem: Coping: Goal: Coping ability will improve Outcome: Progressing   Problem: Education: Goal: Knowledge of [facility] Education information/materials will improve Outcome: Progressing Goal: Emotional status will improve Outcome: Progressing

## 2019-07-19 NOTE — BHH Group Notes (Signed)

## 2019-07-19 NOTE — BHH Counselor (Signed)
CSW followed up with patient this morning to see if patient has called [employer].  Patient reports that she has changed her mind and wants to be dishcarged to her boyfriends home.  CSW obtained a consent for patient's boyfriend.  [name], MSW, LCSW 07/19/2019 2:20 PM

## 2019-07-19 NOTE — Progress Notes (Signed)
CSW received a call from Kerrie Pleasure, [facility] Coordinator who reported that pt has had a "plethora" of hospital admissions recently. Pt discharges from one hospital and ends up right back in another one. Per Chervonne, pt has been recently homeless and her father reported she's burned all bridges with her family so no one is willing to let her stay with them.  CSW attempted to contact pts bf Darnelle Maffucci again and received no answer and unable to leave a voicemail as voicemail is not set up.  Evalina Field, MSW, LCSW Clinical Social Work 07/19/2019 1:00 PM

## 2019-07-20 MED ORDER — OLANZAPINE 5 MG PO TBDP
15.0000 mg | ORAL_TABLET | Freq: Every day | ORAL | Status: DC
  Administered 2019-07-20 – 2019-07-21 (×2): 15 mg via ORAL
  Filled 2019-07-20 (×3): qty 3

## 2019-07-20 NOTE — Progress Notes (Addendum)
Recreation Therapy Notes    Date: 07/20/2019  Time: 9:30 am   Location: Craft room   Behavioral response: N/A   Intervention Topic: Problem Solving    Discussion/Intervention: Patient did not attend group.   Clinical Observations/Feedback:  Patient did not attend group.   Lemuel Boodram LRT/CTRS         Kourtland Coopman 07/20/2019 10:40 AM

## 2019-07-20 NOTE — Plan of Care (Signed)
Cooperative and pleasant upon approach. Denying thoughts of self harm/hallucinations.  No sign of distress noted.
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
## 2019-07-20 NOTE — Progress Notes (Signed)
[facility] MD Progress Note  07/20/2019 1:49 PM Julia [name]  MRN:  [id number]   Subjective: Follow-up for this patient presented to any pain hospital and then transferred to [facility] due to psychosis and having depression, mood stability and suicidal ideation. Patient reports today that she feels that she is doing much better.  She states that she feels that she is ready to discharge home.  She is reported that she was going to stay with her boyfriend but no one has been able to get in touch with him.  Patient has continued to deny having any suicidal or homicidal ideations and denies having any hallucinations.  Patient reports that she will continue to try to call her boyfriend so that we can speak with him.  Later in the day the patient approaches me in the hallway and stated that she cannot get in touch with her boyfriend and asked if we can contact her dad.  She reports then that she is not going to live with her dad but wants [place] to call him so that she can discharge.  Patient then states that she is concerned about a court date that she must attend and she reports that she is in an investigation over the private outline of Christy Sartorius.  Principal Problem: Psychosis ([place]) Diagnosis: Principal Problem:   Psychosis ([place]) Active Problems:   Polysubstance abuse ([place])   Depressed mood   Substance induced mood disorder ([place])  Total Time spent with patient: 30 minutes  Past Psychiatric History: Per patient  She was ADD- diagnosed 1 month ago Record review reveals patient has had multiple hospitalizations.   Note seem to suggest that she started having substance abuse problems around age 26 or 19 years old after her mother moved out of the house. Earlier hospitalizations mostly focused on her substance abuse problems and behavior problems.  Since turning 17 she has had multiple other hospitalizations.  Substance abuse still a major issue but it seems like psychotic symptoms and mood instability have been  more prominent.  She does have a history of suicide attempts.  Has had multiple medications.  Able to recall the Abilify made her have a dystonic reaction.  Past Medical History:  Past Medical History:  Diagnosis Date  . Burning with urination 05/03/2015  . Contraceptive management 05/03/2015  . Depression   . Dysmenorrhea 12/30/2013  . Heroin addiction ([place])   . Menorrhagia 12/30/2013  . Menstrual extraction 12/30/2013  . Migraines   . Social anxiety disorder 09/25/2016  . Suicidal ideations   . Vaginal odor 05/03/2015    Past Surgical History:  Procedure Laterality Date  . NO PAST SURGERIES     Family History:  Family History  Problem Relation Age of Onset  . Depression Mother   . Hypertension Father   . Hyperlipidemia Father   . Cancer Paternal Grandmother        breast, uterine  . Cirrhosis Paternal Grandfather        due to alcohol   Family Psychiatric  History: Multiple family members with depression and her aunt reported to have schizophrenia Social History:  Social History   Substance and Sexual Activity  Alcohol Use Yes   Comment: BAC was clear     Social History   Substance and Sexual Activity  Drug Use Yes  . Types: Marijuana, Oxycodone   Comment: reports oxycodone and marijuana    Social History   Socioeconomic History  . Marital status: Single    Spouse name:  Not on file  . Number of children: Not on file  . Years of education: Not on file  . Highest education level: Not on file  Occupational History  . Occupation: Unemployed  Tobacco Use  . Smoking status: Current Every Day Smoker    Packs/day: 0.50    Types: Cigarettes  . Smokeless tobacco: Never Used  . Tobacco comment: Not ready to quit  Substance and Sexual Activity  . Alcohol use: Yes    Comment: BAC was clear  . Drug use: Yes    Types: Marijuana, Oxycodone    Comment: reports oxycodone and marijuana  . Sexual activity: Yes    Birth control/protection: Implant  Other Topics Concern   . Not on file  Social History Narrative   06/23/2019:  Pt stated that she is homeless, that she is a high school graduate, and that she is unemployed and not followed by any outpatient provider.      Lives with Dad. Mom has LGD, lives in [place]. 11th grader. Dog.   Social Determinants of Health   Financial Resource Strain:   . Difficulty of Paying Living Expenses: Not on file  Food Insecurity:   . Worried About Charity fundraiser in the Last Year: Not on file  . Ran Out of Food in the Last Year: Not on file  Transportation Needs:   . Lack of Transportation (Medical): Not on file  . Lack of Transportation (Non-Medical): Not on file  Physical Activity:   . Days of Exercise per Week: Not on file  . Minutes of Exercise per Session: Not on file  Stress:   . Feeling of Stress : Not on file  Social Connections:   . Frequency of Communication with Friends and Family: Not on file  . Frequency of Social Gatherings with Friends and Family: Not on file  . Attends Religious Services: Not on file  . Active Member of Clubs or Organizations: Not on file  . Attends Archivist Meetings: Not on file  . Marital Status: Not on file   Additional Social History:                         Sleep: Good  Appetite:  Fair  Current Medications: Current Facility-Administered Medications  Medication Dose Route Frequency Provider Last Rate Last Admin  . acetaminophen (TYLENOL) tablet 650 mg  650 mg Oral Q6H PRN [name], MD   650 mg at 07/18/19 1841  . alum & mag hydroxide-simeth (MAALOX/MYLANTA) 200-200-20 MG/5ML suspension 30 mL  30 mL Oral Q4H PRN [name], MD      . benztropine (COGENTIN) tablet 2 mg  2 mg Oral BID PRN [name], MD   2 mg at 07/15/19 1611   Or  . benztropine mesylate (COGENTIN) injection 2 mg  2 mg Intramuscular BID PRN [name], MD      . diphenhydrAMINE (BENADRYL) capsule 50 mg  50 mg Oral Q6H PRN [name], MD       Or  .  diphenhydrAMINE (BENADRYL) injection 50 mg  50 mg Intramuscular Q6H PRN [name], MD   50 mg at 07/15/19 1612  . FLUoxetine (PROZAC) capsule 40 mg  40 mg Oral Daily [name], MD   40 mg at 07/20/19 [id number]  . haloperidol (HALDOL) tablet 5 mg  5 mg Oral Q6H PRN [name], MD   5 mg at 07/18/19 [id number]  Or  . haloperidol lactate (HALDOL) injection 5 mg  5 mg Intramuscular Q6H PRN [name], MD      . magnesium hydroxide (MILK OF MAGNESIA) suspension 30 mL  30 mL Oral Daily PRN [name], MD      . OLANZapine zydis (ZYPREXA) disintegrating tablet 15 mg  15 mg Oral QHS Myah Guynes, Lowry Ram, FNP      . [START ON 07/21/2019] paliperidone (INVEGA SUSTENNA) injection 156 mg  156 mg Intramuscular Once [name], MD      . traZODone (DESYREL) tablet 50 mg  50 mg Oral QHS PRN [name], MD   50 mg at 07/19/19 2114  . ziprasidone (GEODON) injection 20 mg  20 mg Intramuscular Once [name], MD        Lab Results: No results found for this or any previous visit (from the past 48 hour(s)).  Blood Alcohol level:  Lab Results  Component Value Date   ETH <10 07/09/2019   ETH <10 [id number]    Metabolic Disorder Labs: Lab Results  Component Value Date   HGBA1C 5.2 05/17/2019   MPG 102.54 05/17/2019   MPG 108.28 02/17/2019   Lab Results  Component Value Date   PROLACTIN 31.9 (H) 02/17/2019   PROLACTIN 29.6 (H) 09/25/2016   Lab Results  Component Value Date   CHOL 144 05/17/2019   TRIG 65 05/17/2019   HDL 35 (L) 05/17/2019   CHOLHDL 4.1 05/17/2019   VLDL 13 05/17/2019   LDLCALC 96 05/17/2019   LDLCALC 76 02/17/2019    Physical Findings: AIMS:  , ,  ,  ,    CIWA:    COWS:     Musculoskeletal: Strength & Muscle Tone: within normal limits Gait & Station: normal Patient leans: N/A  Psychiatric Specialty Exam: Physical Exam  Nursing note and vitals reviewed. Constitutional: She is oriented to person, place, and time. She appears  well-developed and well-nourished.  Respiratory: Effort normal.  Musculoskeletal:        General: Normal range of motion.  Neurological: She is alert and oriented to person, place, and time.  Skin: Skin is warm.    Review of Systems  Constitutional: Negative.   HENT: Negative.   Eyes: Negative.   Respiratory: Negative.   Cardiovascular: Negative.   Gastrointestinal: Negative.   Genitourinary: Negative.   Musculoskeletal: Negative.   Skin: Negative.   Neurological: Negative.   Psychiatric/Behavioral:       Delusional thoughts    Blood pressure 108/75, pulse (!) 106, temperature 97.8 F (36.6 C), temperature source Oral, resp. rate 18, height 5\' 4"  (1.626 m), weight 67.2 kg, SpO2 99 %.Body mass index is 25.43 kg/m.  General Appearance: Casual  Eye Contact:  Good  Speech:  Clear and Coherent and Normal Rate  Volume:  Normal  Mood:  Euthymic  Affect:  Flat  Thought Process:  Coherent and Descriptions of Associations: Circumstantial  Orientation:  Full (Time, Place, and Person)  Thought Content:  WDL and Delusions  Suicidal Thoughts:  No  Homicidal Thoughts:  No  Memory:  Immediate;   Fair Recent;   Fair Remote;   Fair  Judgement:  Fair  Insight:  Fair  Psychomotor Activity:  Normal  Concentration:  Concentration: Fair  Recall:  [employer] of Knowledge:  Fair  Language:  Fair  Akathisia:  No  Handed:  Right  AIMS (if indicated):     Assets:  Desire for Improvement Physical Health Resilience  ADL's:  Intact  Cognition:  WNL  Sleep:  Number of Hours: 8   Assessment: Patient presents in her room lying in the bed but is awake.  Patient is pleasant, calm, and cooperative.  Patient has continued to deny any suicidal or homicidal ideations and denies having any hallucinations.  Patient has continued reporting that she has been sleeping well and eating well.  It was thought the patient was stabilized enough to her possible discharge if a safety plan was arranged with the  patient's boyfriend as she reports this is where she is going to live.  However, the patient continued discussing been in an investigation over [employer] and there is no validation to the story.  Patient is also came up with various other stories in his attempt to be discharged sooner.  Based on patient's presentation and after consult with Dr. Mallie Darting, the patient's Zyprexa will be increased to 50 mg p.o. nightly and continue all other medications at this time.  Treatment Plan Summary: Daily contact with patient to assess and evaluate symptoms and progress in treatment and Medication management Continue Prozac 40 mg p.o. daily for depression Increase Zyprexa 15 mg p.o. nightly for psychosis Continue trazodone 50 mg p.o. nightly as needed for insomnia Continue agitation protocol of Haldol p.o. or IM every 6 hours as needed, Benadryl 50 mg p.o. or IM every 6 hours as needed Encourage group therapy participation Continue every 15 minute safety checks  Lewis Shock, FNP 07/20/2019, 1:49 PM

## 2019-07-20 NOTE — Plan of Care (Signed)
Patient stayed in the milieu until bedtime. Pleasant and cooperative. Alert and oriented. Participated in group activities. Received bedtime medications and had a snack. Currently in bed resting. Safety precautions maintained.

## 2019-07-20 NOTE — Plan of Care (Signed)
D- Patient alert and oriented. Patient presents in a pleasant mood on assessment stating that she slept ok last night and had no complaints to voice to this Probation officer. Patient denies any signs/symptoms of depression/anxiety stating that she is "feeling better". Patient also denies SI, HI, AVH, and pain at this time. Patient had no stated goals for today.  A- Scheduled medications administered to patient, per MD orders. Support and encouragement provided.  Routine safety checks conducted every 15 minutes.  Patient informed to notify staff with problems or concerns.  R- No adverse drug reactions noted. Patient contracts for safety at this time. Patient compliant with medications and treatment plan. Patient receptive, calm, and cooperative. Patient interacts well with others on the unit.  Patient remains safe at this time.  Problem: Education: Goal: Utilization of techniques to improve thought processes will improve Outcome: Progressing Goal: Knowledge of the prescribed therapeutic regimen will improve Outcome: Progressing   Problem: Activity: Goal: Interest or engagement in leisure activities will improve Outcome: Progressing Goal: Imbalance in normal sleep/wake cycle will improve Outcome: Progressing   Problem: Coping: Goal: Coping ability will improve Outcome: Progressing Goal: Will verbalize feelings Outcome: Progressing   Problem: Health Behavior/Discharge Planning: Goal: Ability to make decisions will improve Outcome: Progressing Goal: Compliance with therapeutic regimen will improve Outcome: Progressing   Problem: Role Relationship: Goal: Will demonstrate positive changes in social behaviors and relationships Outcome: Progressing   Problem: Safety: Goal: Ability to disclose and discuss suicidal ideas will improve Outcome: Progressing Goal: Ability to identify and utilize support systems that promote safety will improve Outcome: Progressing   Problem: Self-Concept: Goal: Will  verbalize positive feelings about self Outcome: Progressing Goal: Level of anxiety will decrease Outcome: Progressing   Problem: Education: Goal: Knowledge of the prescribed therapeutic regimen will improve Outcome: Progressing   Problem: Coping: Goal: Coping ability will improve Outcome: Progressing   Problem: Education: Goal: Knowledge of [place] [facility] Education information/materials will improve Outcome: Progressing Goal: Emotional status will improve Outcome: Progressing

## 2019-07-20 NOTE — Progress Notes (Signed)
Patient has been pacing up and down her hallway for about forty-five minutes. Patient remains safe on the unit.

## 2019-07-20 NOTE — BHH Counselor (Signed)
CSW attempted to contact pts bf, Marcie Bal [phone].  CSW was unable to speak with the boyfriend or leave a VM message.  CSW will attempt again later.  [name], MSW, LCSW 07/20/2019 2:42 PM

## 2019-07-20 NOTE — BHH Group Notes (Signed)

## 2019-07-21 MED ORDER — OLANZAPINE 5 MG PO TBDP
5.0000 mg | ORAL_TABLET | Freq: Every day | ORAL | Status: DC
  Administered 2019-07-21 – 2019-07-23 (×3): 5 mg via ORAL
  Filled 2019-07-21 (×4): qty 1

## 2019-07-21 NOTE — BHH Counselor (Signed)
CSW again attempted to reach the patient's boyfriend, Darnelle Maffucci [phone]. CSW was unable to speak with him or leave a message as the pt's boyfriend does not have a voicemail box that is set up.   [name], MSW, LCSW 07/21/2019 1:04 PM

## 2019-07-21 NOTE — BHH Counselor (Signed)
CSW called [employer] to confirm patient can return. CSW was asked to call back in 30 minutes as they would need to look into why the patient left.  [name], MSW, LCSW 07/21/2019 3:38 PM
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
## 2019-07-21 NOTE — Progress Notes (Signed)
CSW and pt called pts boyfriend, Darnelle Maffucci on speaker phone to ensure pt could go there at discharge. Pt provided the phone number [phone]. The phone went to voicemail that was not set up and a message could not be left. Pt reported that Darnelle Maffucci was "in the bank right now". CSW asked pt if Darnelle Maffucci worked and what hours, pt reported he works 7A-7P. CSW stated Darnelle Maffucci may be at work and then pt stated he was off today and he was at the bank. CSW asked pt when the last time she talked to [place] and she stated "I don't know.....Marland Kitchenearlier today on the phone" Pt stated Darnelle Maffucci said she could stay with him. CSW reported pt could try to call again after 5pm when the phones come back on and have [place] call the nurses station and let them know that she can come there and to provide his address. CSW also informed pt that if that did not happen then pts option would be to go back to [facility] at discharge. Pt stated she knows she can go to [facility]. CSW reiterated that if Darnelle Maffucci does not confirm she can go there then her option would be [facility]. Pt stated okay.   Evalina Field, MSW, LCSW Clinical Social Work 07/21/2019 3:19 PM

## 2019-07-21 NOTE — Progress Notes (Signed)
D:Psychosis A: No group participation . Appetite good at meals . Patient compliant  With  Medications  this am . Limited interaction  With peers . Following  Lunch  Patient continually  Requested to use the phone   Wanting   To  Go home  With her boyfriend Julia [name] . Patient got social work to call a number  That she said was the boyfriend X2  Unable to talk with him. Staff has informed  Patient  That she would have to go [facility]  Patient stated slept good last night .Stated appetite good and energy level   normal. Stated concentration  good . Stated on Depression scale 0 , hopeless 0 and anxiety 0 .( low 0-10 high) Denies suicidal  homicidal ideations. Denies auditory hallucinations  No pain concerns . Appropriate ADL'S.Limited Interacting with peers and staff. Patient aware of [place] education , able to verbalize understanding  with redirection from staff. Emotional and mental health status improving . Able to maintain frustration no anger management concerns. Voice of no safety concerns . Working on Radiographer, therapeutic. Aware of medication received.  Encourage patient participation with unit programming . Instruction  Given on  Medication , verbalize understanding. R: Voice no other concerns. Staff continue to monitor

## 2019-07-21 NOTE — Progress Notes (Signed)
Recreation Therapy Notes   Date: 07/21/2019  Time: 9:30 am   Location: Craft room   Behavioral response: N/A   Intervention Topic: Stress   Discussion/Intervention: Patient did not attend group.   Clinical Observations/Feedback:  Patient did not attend group.   Ilo Beamon LRT/CTRS        Alvy Alsop 07/21/2019 11:04 AM

## 2019-07-21 NOTE — [facility] Group Notes (Signed)
LCSW Group Therapy Note  07/21/2019 11:32 AM  Type of Therapy/Topic:  Group Therapy:  Emotion Regulation  Participation Level:  Did Not Attend   Description of Group:   The purpose of this group is to assist patients in learning to regulate negative emotions and experience positive emotions. Patients will be guided to discuss ways in which they have been vulnerable to their negative emotions. These vulnerabilities will be juxtaposed with experiences of positive emotions or situations, and patients will be challenged to use positive emotions to combat negative ones. Special emphasis will be placed on coping with negative emotions in conflict situations, and patients will process healthy conflict resolution skills.  Therapeutic Goals: 1. Patient will identify two positive emotions or experiences to reflect on in order to balance out negative emotions 2. Patient will label two or more emotions that they find the most difficult to experience 3. Patient will demonstrate positive conflict resolution skills through discussion and/or role plays  Summary of Patient Progress: x   Therapeutic Modalities:   Cognitive Behavioral Therapy Feelings Identification Dialectical Behavioral Therapy   Evalina Field, MSW, LCSW Clinical Social Work 07/21/2019 11:32 AM

## 2019-07-21 NOTE — Plan of Care (Signed)
  Patient aware of  education , able to verbalize understanding  with redirection from staff. Emotional and mental health status improving . Able to maintain frustration no anger management concerns. Voice of no safety concerns . Working on Radiographer, therapeutic. Aware of medication received.  Problem: Education: Goal: Utilization of techniques to improve thought processes will improve Outcome: Progressing Goal: Knowledge of the prescribed therapeutic regimen will improve Outcome: Progressing   Problem: Activity: Goal: Interest or engagement in leisure activities will improve Outcome: Progressing Goal: Imbalance in normal sleep/wake cycle will improve Outcome: Progressing   Problem: Coping: Goal: Coping ability will improve Outcome: Progressing Goal: Will verbalize feelings Outcome: Progressing   Problem: Health Behavior/Discharge Planning: Goal: Ability to make decisions will improve Outcome: Progressing Goal: Compliance with therapeutic regimen will improve Outcome: Progressing   Problem: Role Relationship: Goal: Will demonstrate positive changes in social behaviors and relationships Outcome: Progressing   Problem: Self-Concept: Goal: Will verbalize positive feelings about self Outcome: Progressing Goal: Level of anxiety will decrease Outcome: Progressing   Problem: Education: Goal: Knowledge of the prescribed therapeutic regimen will improve Outcome: Progressing   Problem: Coping: Goal: Coping ability will improve Outcome: Progressing

## 2019-07-22 MED ORDER — OXCARBAZEPINE 150 MG PO TABS
150.0000 mg | ORAL_TABLET | Freq: Two times a day (BID) | ORAL | Status: DC
  Administered 2019-07-22 – 2019-07-23 (×2): 150 mg via ORAL
  Filled 2019-07-22 (×4): qty 1

## 2019-07-22 MED ORDER — ZIPRASIDONE MESYLATE 20 MG IM SOLR
20.0000 mg | Freq: Once | INTRAMUSCULAR | Status: AC
  Administered 2019-07-22: 20 mg via INTRAMUSCULAR
  Filled 2019-07-22: qty 20

## 2019-07-22 NOTE — Progress Notes (Signed)
D:Psychosis  A:   Patient standing in hall  Staring at nursing station  During bed progression . Came back later, stating staff could read her mind  . Noted to yell at staff  Making  Untrue accusations .  Appetite good at meals breakfast lunch and dinner.  Patient ask if she would be leaving today .  Patient  later came back asking if she could call her father  For placement . Walked back and forth to nursing station  Several time  Looking at the  clock.   suspicious  , paranoid behavior noted . Requesting  If she could later be discharge not having anywhere to go . Patient pacing in hall. Patient stated to staff someone was coming to blow up the hospital.  Patient stated slept good last night .Stated appetite is good and energy level  Is normal. Stated concentration is good . Stated on Depression scale 0 , hopeless 0 and anxiety 0.( low 0-10 high) Denies suicidal  homicidal ideations  .Auditory hallucinations  No pain concerns .No ADL'S. Interacting with peers and staff.  Encourage patient participation with unit programming . Instruction  Given on  Medication , verbalize understanding.  R: Voice no other concerns. Staff continue to monitor

## 2019-07-22 NOTE — Progress Notes (Signed)
Patient was in her room upon arrival to the unit. Patient was isolative to her room this evening. Patient denies anxiety and depression. Patient denies SI/HI with this Probation officer.Patient was isolative to her room this evening and minimal with staff and peers. Patient came up to the nurses station stating, "People are following me. Not literally but they are following me." Patient given education.Patient compliant with medication administration per MD orders. Patient given education. Patient given support and encouragement to be active in her treatment plan. Patient being monitored Q 15 min safety checks per unit protocol. Patient remains safe on the unit.
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
## 2019-07-22 NOTE — H&P (Signed)
CSW faxed referrals to [facility].  [name], MSW, LCSW 07/22/2019 3:31 PM
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
## 2019-07-22 NOTE — BHH Counselor (Signed)
CSW checked in with [facility] [employer]. CSW was informed that shelter is full.  Pt continues to need shelter resources.  CSW followed up with [facility] and continued to not get an answer.  [name], MSW, LCSW 07/22/2019 2:26 PM

## 2019-07-22 NOTE — Progress Notes (Signed)
Recreation Therapy Notes  Date: 07/22/2019  Time: 9:30 am   Location: Craft room   Behavioral response: N/A   Intervention Topic: Coping Skills  Discussion/Intervention: Patient did not attend group.   Clinical Observations/Feedback:  Patient did not attend group.   Jerzey Komperda LRT/CTRS        Shonteria Abeln 07/22/2019 11:31 AM

## 2019-07-22 NOTE — Progress Notes (Signed)
Patient came up to the nurses station irritable, yelling wanting to speak with the police. Security went to talk to her but she just started screaming saying she wanted to leave the country, she doesn't feel safe here. Patient given education but wasn't receptive. Patient started crying, hitting her hands together in a threatening manner. Patient spitting on herself and crying. MD called and PRN medications ordered. (See MAR).

## 2019-07-22 NOTE — BHH Counselor (Signed)
Balance In Life 07/22/2019 1PM  Type of Therapy/Topic:  Group Therapy:  Balance in Life  Participation Level:  None  Description of Group:   This group will address the concept of balance and how it feels and looks when one is unbalanced. Patients will be encouraged to process areas in their lives that are out of balance and identify reasons for remaining unbalanced. Facilitators will guide patients in utilizing problem-solving interventions to address and correct the stressor making their life unbalanced. Understanding and applying boundaries will be explored and addressed for obtaining and maintaining a balanced life. Patients will be encouraged to explore ways to assertively make their unbalanced needs known to significant others in their lives, using other group members and facilitator for support and feedback.  Therapeutic Goals: 1. Patient will identify two or more emotions or situations they have that consume much of in their lives. 2. Patient will identify signs/triggers that life has become out of balance:  3. Patient will identify two ways to set boundaries in order to achieve balance in their lives:  4. Patient will demonstrate ability to communicate their needs through discussion and/or role plays  Summary of Patient Progress: Patient came to group for a very short period of time, left and did not return.    Therapeutic Modalities:   Cognitive Behavioral Therapy Solution-Focused Therapy Assertiveness Training  Jaylend Reiland Lynelle Smoke, LCSW

## 2019-07-22 NOTE — Plan of Care (Signed)
Patient irritable and agitated this evening.   Problem: Coping: Goal: Coping ability will improve Outcome: Not Progressing

## 2019-07-22 NOTE — Plan of Care (Signed)
Patient aware of [place] education , able to verbalize understanding  with redirection from staff. Emotional and mental  health status  improving . Able to maintain frustration  no anger management  concerns. Voice of no safety concerns . Working on [employer] laboratory technician. Aware of  medication received.    Problem: Education: Goal: Utilization of techniques to improve thought processes will improve Outcome: Progressing Goal: Knowledge of the prescribed therapeutic regimen will improve Outcome: Progressing   Problem: Activity: Goal: Interest or engagement in leisure activities will improve Outcome: Progressing Goal: Imbalance in normal sleep/wake cycle will improve Outcome: Progressing   Problem: Coping: Goal: Coping ability will improve Outcome: Progressing Goal: Will verbalize feelings Outcome: Progressing   Problem: Health Behavior/Discharge Planning: Goal: Ability to make decisions will improve Outcome: Progressing Goal: Compliance with therapeutic regimen will improve Outcome: Progressing   Problem: Role Relationship: Goal: Will demonstrate positive changes in social behaviors and relationships Outcome: Progressing   Problem: Safety: Goal: Ability to disclose and discuss suicidal ideas will improve Outcome: Progressing Goal: Ability to identify and utilize support systems that promote safety will improve Outcome: Progressing   Problem: Self-Concept: Goal: Will verbalize positive feelings about self Outcome: Progressing Goal: Level of anxiety will decrease Outcome: Progressing   Problem: Education: Goal: Knowledge of the prescribed therapeutic regimen will improve Outcome: Progressing   Problem: Education: Goal: Knowledge of [place] [facility] Education information/materials will improve Outcome: Progressing Goal: Emotional status will improve Outcome: Progressing

## 2019-07-22 NOTE — Tx Team (Signed)
Interdisciplinary Treatment and Diagnostic Plan Update  07/22/2019 Time of Session: 830AM Julia [name] MRN: [id number]  Principal Diagnosis: Psychosis [facility])  Secondary Diagnoses: Principal Problem:   Psychosis ([place]) Active Problems:   Polysubstance abuse ([place])   Depressed mood   Substance induced mood disorder ([place])   Current Medications:  Current Facility-Administered Medications  Medication Dose Route Frequency Provider Last Rate Last Admin  . acetaminophen (TYLENOL) tablet 650 mg  650 mg Oral Q6H PRN [name], MD   650 mg at 07/18/19 1841  . alum & mag hydroxide-simeth (MAALOX/MYLANTA) 200-200-20 MG/5ML suspension 30 mL  30 mL Oral Q4H PRN [name], MD      . benztropine (COGENTIN) tablet 2 mg  2 mg Oral BID PRN [name], MD   2 mg at 07/15/19 1611   Or  . benztropine mesylate (COGENTIN) injection 2 mg  2 mg Intramuscular BID PRN [name], MD      . diphenhydrAMINE (BENADRYL) capsule 50 mg  50 mg Oral Q6H PRN [name], MD       Or  . diphenhydrAMINE (BENADRYL) injection 50 mg  50 mg Intramuscular Q6H PRN [name], MD   50 mg at 07/15/19 1612  . FLUoxetine (PROZAC) capsule 40 mg  40 mg Oral Daily [name], MD   40 mg at 07/22/19 0753  . haloperidol (HALDOL) tablet 5 mg  5 mg Oral Q6H PRN [name], MD   5 mg at 07/18/19 1953   Or  . haloperidol lactate (HALDOL) injection 5 mg  5 mg Intramuscular Q6H PRN [name], MD      . magnesium hydroxide (MILK OF MAGNESIA) suspension 30 mL  30 mL Oral Daily PRN [name], MD      . OLANZapine zydis (ZYPREXA) disintegrating tablet 15 mg  15 mg Oral QHS Money, Lowry Ram, FNP   15 mg at 07/21/19 2135  . OLANZapine zydis (ZYPREXA) disintegrating tablet 5 mg  5 mg Oral Daily Sharma Covert, MD   5 mg at 07/22/19 0753  . OXcarbazepine (TRILEPTAL) tablet 150 mg  150 mg Oral BID Sharma Covert, MD      . traZODone (DESYREL) tablet 50 mg  50 mg Oral QHS PRN [name], MD   50 mg at 07/21/19 2135   PTA Medications: Medications Prior to Admission  Medication Sig Dispense Refill Last Dose  . etonogestrel (NEXPLANON) 68 MG IMPL implant 1 each (68 mg total) by Subdermal route once for 1 dose. (Birth control method) 1 each 0   . FLUoxetine (PROZAC) 10 MG capsule Take 3 capsules (30 mg total) by mouth daily. 90 capsule 1   . hydrOXYzine (ATARAX/VISTARIL) 25 MG tablet Take 1 tablet (25 mg total) by mouth every 6 (six) hours as needed for anxiety. 60 tablet 1   . QUEtiapine (SEROQUEL) 50 MG tablet Take 1 tablet (50 mg total) by mouth daily. 30 tablet 1   . traZODone (DESYREL) 50 MG tablet Take 50 mg by mouth at bedtime as needed. for sleep       Patient Stressors: Financial difficulties Loss of Mother Marital or family conflict Substance abuse  Patient Strengths: Ability for insight Communication skills  Treatment Modalities: Medication Management, Group therapy, Case management,  1 to 1 session with clinician, Psychoeducation, Recreational therapy.   Physician Treatment Plan for Primary Diagnosis: Psychosis [facility]) Long Term Goal(s): Improvement in symptoms so as ready for discharge Improvement  in symptoms so as ready for discharge   Short Term Goals: Ability to verbalize feelings will improve Ability to disclose and discuss suicidal ideas Ability to demonstrate self-control will improve Ability to identify and develop effective coping behaviors will improve Compliance with prescribed medications will improve  Medication Management: Evaluate patient's response, side effects, and tolerance of medication regimen.  Therapeutic Interventions: 1 to 1 sessions, Unit Group sessions and Medication administration.  Evaluation of Outcomes: Not Met  Physician Treatment Plan for Secondary Diagnosis: Principal Problem:   Psychosis ([place]) Active Problems:   Polysubstance abuse ([place])   Depressed mood   Substance induced mood disorder ([place])  Long  Term Goal(s): Improvement in symptoms so as ready for discharge Improvement in symptoms so as ready for discharge   Short Term Goals: Ability to verbalize feelings will improve Ability to disclose and discuss suicidal ideas Ability to demonstrate self-control will improve Ability to identify and develop effective coping behaviors will improve Compliance with prescribed medications will improve     Medication Management: Evaluate patient's response, side effects, and tolerance of medication regimen.  Therapeutic Interventions: 1 to 1 sessions, Unit Group sessions and Medication administration.  Evaluation of Outcomes: Not Met   RN Treatment Plan for Primary Diagnosis: Psychosis ([place]) Long Term Goal(s): Knowledge of disease and therapeutic regimen to maintain health will improve  Short Term Goals: Ability to demonstrate self-control, Ability to participate in decision making will improve, Ability to verbalize feelings will improve, Ability to disclose and discuss suicidal ideas, Ability to identify and develop effective coping behaviors will improve and Compliance with prescribed medications will improve  Medication Management: RN will administer medications as ordered by provider, will assess and evaluate patient's response and provide education to patient for prescribed medication. RN will report any adverse and/or side effects to prescribing provider.  Therapeutic Interventions: 1 on 1 counseling sessions, Psychoeducation, Medication administration, Evaluate responses to treatment, Monitor vital signs and CBGs as ordered, Perform/monitor CIWA, COWS, AIMS and Fall Risk screenings as ordered, Perform wound care treatments as ordered.  Evaluation of Outcomes: Not Met   LCSW Treatment Plan for Primary Diagnosis: Psychosis ([place]) Long Term Goal(s): Safe transition to appropriate next level of care at discharge, Engage patient in therapeutic group addressing interpersonal concerns.  Short  Term Goals: Engage patient in aftercare planning with referrals and resources, Increase social support, Increase ability to appropriately verbalize feelings, Increase emotional regulation, Facilitate acceptance of mental health diagnosis and concerns and Facilitate patient progression through stages of change regarding substance use diagnoses and concerns  Therapeutic Interventions: Assess for all discharge needs, 1 to 1 time with Social worker, Explore available resources and support systems, Assess for adequacy in community support network, Educate family and significant other(s) on suicide prevention, Complete Psychosocial Assessment, Interpersonal group therapy.  Evaluation of Outcomes: Not Met   Progress in Treatment: Attending groups: No Participating in groups: No. Taking medication as prescribed: Yes. Toleration medication: Yes. Family/Significant other contact made: No, will contact:  pt declined. Patient understands diagnosis: Yes. Discussing patient identified problems/goals with staff: Yes. Medical problems stabilized or resolved: Yes. Denies suicidal/homicidal ideation: Yes. Issues/concerns per patient self-inventory: No. Other: none  New problem(s) identified: No, Describe:  none  New Short Term/Long Term Goal(s):  elimination of symptoms of psychosis, medication management for mood stabilization; elimination of SI thoughts; development of comprehensive mental wellness/sobriety plan.  Patient Goals:  "to have a plan"  Discharge Plan or Barriers: Patient is currently homeless.  Patient aftercare depends on where patient  will be staying. CSW is attempting to identify an available bed at local homeless shelters.  Patient reports that she can not return to [facility] Update 07/22/19-Pt displaying psychotic behavior. No other social supports identified. CSW's attempted to contact pt boyfriend and father. Awaiting response from boyfriend and pt father lives in a nursing  home.  Discharge plan TBD  Reason for Continuation of Hospitalization: Anxiety Depression Hallucinations Medication stabilization  Estimated Length of Stay: TBD  Attendees: Patient: 07/17/2019   Physician: Myles Lipps 07/17/2019   Nursing: Polly Cobia 07/17/2019   RN Care Manager: 07/17/2019   Social Worker: Sanjuana Kava [facility] [place] 07/17/2019   Recreational Therapist:  07/17/2019   Other:  07/17/2019   Other:  07/17/2019   Other: 07/17/2019        Scribe for Treatment Team: Yvette Rack, LCSW 07/22/2019 11:17 AM

## 2019-07-22 NOTE — Plan of Care (Signed)
Patient irritable this evening with staff  Problem: Coping: Goal: Coping ability will improve Outcome: Not Progressing

## 2019-07-22 NOTE — BHH Counselor (Signed)
CSW called several times this morning to identify if patient could in fact return to [facility].  CSW has spoke to several individuals including supervisor staff and no answer has been provided. CSW was informed that she would received "a call right back" at the time of the update no return call has been given.  Call was at 8:18AM  [name], MSW, LCSW 07/22/2019 10:14 AM

## 2019-07-23 MED ORDER — ZIPRASIDONE MESYLATE 20 MG IM SOLR
20.0000 mg | Freq: Once | INTRAMUSCULAR | Status: AC
  Administered 2019-07-23: 21:00:00 20 mg via INTRAMUSCULAR
  Filled 2019-07-23: qty 20

## 2019-07-23 MED ORDER — OLANZAPINE 5 MG PO TBDP: 20.0000 mg | ORAL_TABLET | Freq: Every day | ORAL | Status: DC

## 2019-07-23 MED ORDER — HALOPERIDOL 5 MG PO TABS
10.0000 mg | ORAL_TABLET | Freq: Four times a day (QID) | ORAL | Status: DC | PRN
  Administered 2019-07-24 – 2019-08-16 (×9): 10 mg via ORAL
  Filled 2019-07-23 (×12): qty 2

## 2019-07-23 MED ORDER — OXCARBAZEPINE 300 MG PO TABS
300.0000 mg | ORAL_TABLET | Freq: Two times a day (BID) | ORAL | Status: DC
  Administered 2019-07-23: 300 mg via ORAL
  Filled 2019-07-23 (×2): qty 1

## 2019-07-23 MED ORDER — HALOPERIDOL LACTATE 5 MG/ML IJ SOLN
10.0000 mg | Freq: Four times a day (QID) | INTRAMUSCULAR | Status: DC | PRN
  Administered 2019-07-23: 10 mg via INTRAMUSCULAR
  Filled 2019-07-23: qty 2

## 2019-07-23 NOTE — BHH Counselor (Signed)
CSW faxed referral to [facility] for ACT.  CSW received confirmation for fax.  [name], MSW, LCSW 07/23/2019 11:26 AM

## 2019-07-23 NOTE — Progress Notes (Signed)
Recreation Therapy Notes  Date: 07/23/2019  Time: 9:30 am   Location: Craft room   Behavioral response: N/A   Intervention Topic: Happiness   Discussion/Intervention: Patient did not attend group.   Clinical Observations/Feedback:  Patient did not attend group.   Kaho Selle LRT/CTRS        Nicolai Labonte 07/23/2019 11:05 AM
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
## 2019-07-23 NOTE — Progress Notes (Signed)
D: Psychosis  A:Patient stated slept good last night .Stated appetite  good and energy level  normal. Stated concentration is good . Denies  Depression scale , hopeless and anxiety . Denies suicidal  homicidal ideations  . Auditory hallucinations noted.   No pain concerns . Appropriate ADL'S. Limited  Interacting with peers and staff.  Aware of medication received.Patient aware of [place] education , able to verbalize understandingwith redirection from staff. Emotional and mental health status improving . Able to maintain frustration no anger management concerns. Voice of no safety concerns .  Instruction  Given on  Medication , verbalize understanding. Continue  Isolating to room   R: Voice no other concerns. Staff continue to monitor
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
## 2019-07-23 NOTE — Progress Notes (Signed)
CSW spoke with [facility] who reported pt cannot return to their shelter. She is listed as "no readmittance".  CSW team provided pt with [employer] information and application packet.  Evalina Field, MSW, LCSW Clinical Social Work 07/23/2019 10:46 AM

## 2019-07-23 NOTE — BHH Group Notes (Signed)
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
## 2019-07-23 NOTE — Plan of Care (Signed)
Aware of  medication received. Patient aware of [place] education , able to verbalize understanding  with redirection from staff. Emotional and mental  health status  improving . Able to maintain frustration  no anger management  concerns. Voice of no safety concerns . Working on Scientific laboratory technician.    Problem: Education: Goal: Utilization of techniques to improve thought processes will improve Outcome: Progressing Goal: Knowledge of the prescribed therapeutic regimen will improve Outcome: Progressing   Problem: Activity: Goal: Interest or engagement in leisure activities will improve Outcome: Progressing Goal: Imbalance in normal sleep/wake cycle will improve Outcome: Progressing   Problem: Health Behavior/Discharge Planning: Goal: Ability to make decisions will improve Outcome: Progressing Goal: Compliance with therapeutic regimen will improve Outcome: Progressing

## 2019-07-23 NOTE — BHH Counselor (Signed)
CSW checked in with the patient on [facility] referral.  Pt reported that she has not reviewed it.  CSW encouraged pt to review the packet to determine if this was a program that she is interested in.    [name], MSW, LCSW 07/23/2019 2:10 PM

## 2019-07-24 MED ORDER — ZIPRASIDONE HCL 40 MG PO CAPS
60.0000 mg | ORAL_CAPSULE | Freq: Two times a day (BID) | ORAL | Status: DC
  Administered 2019-07-24 – 2019-07-25 (×3): 60 mg via ORAL
  Filled 2019-07-24 (×4): qty 1

## 2019-07-24 MED ORDER — OXCARBAZEPINE 300 MG PO TABS
450.0000 mg | ORAL_TABLET | Freq: Two times a day (BID) | ORAL | Status: DC
  Administered 2019-07-24 – 2019-07-25 (×2): 450 mg via ORAL
  Filled 2019-07-24 (×5): qty 1

## 2019-07-24 MED ORDER — ZIPRASIDONE MESYLATE 20 MG IM SOLR
20.0000 mg | Freq: Four times a day (QID) | INTRAMUSCULAR | Status: DC | PRN
  Administered 2019-08-05 – 2019-08-07 (×2): 20 mg via INTRAMUSCULAR
  Filled 2019-07-24 (×3): qty 20

## 2019-07-24 NOTE — Plan of Care (Signed)
Patient has been cooperative. Complained of voices earlier but calmed down later. Had a snack and went to bed. No major concern.

## 2019-07-24 NOTE — BHH Counselor (Signed)
CSW met with patient to discuss the patient's referral to [facility]. Patient stated that she did not want to finish the referral because she is just going to go home with her father. CSW asked if she need any assistance and patient declined.

## 2019-07-24 NOTE — Progress Notes (Signed)
Patient was witnessed spitting medication into trash. When asked why she had just done that patient responded "I don't know I just felt like it." Patient's medication was pulled again and provided. Patient's mouth was checked for verification of adherence. Patient apologized and returned to room.
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
## 2019-07-24 NOTE — Progress Notes (Signed)
Patient is very agitated and is yelling and screaming in hallway at staff. Attempted to redirect patient. Patient requests medication for agitation and anxiety. PRNs are provided to patient.
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
## 2019-07-25 MED ORDER — ZIPRASIDONE HCL 40 MG PO CAPS
80.0000 mg | ORAL_CAPSULE | Freq: Every day | ORAL | Status: DC
  Administered 2019-07-27 – 2019-07-30 (×4): 80 mg via ORAL
  Filled 2019-07-25 (×6): qty 2

## 2019-07-25 MED ORDER — ZIPRASIDONE HCL 40 MG PO CAPS
60.0000 mg | ORAL_CAPSULE | Freq: Every day | ORAL | Status: DC
  Administered 2019-07-28 – 2019-07-31 (×4): 60 mg via ORAL
  Filled 2019-07-25 (×7): qty 1

## 2019-07-25 NOTE — Progress Notes (Signed)
Patient came to nurses station with another staff member reporting that she was stiffening up. This Probation officer observed patient's left hand and fingers contracting. This Probation officer administered PRN medications. Patient went to her room to lay down afterwards.

## 2019-07-25 NOTE — Plan of Care (Signed)
Has been in the milieu, pleasant and cooperative. Currently in the dayroom with peers.  Maintains a positive attitude. Expressing needs appropriately. Denying hallucinations. Denying thoughts of self harm. No sign of distress.
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
## 2019-07-25 NOTE — Plan of Care (Signed)
D- Patient alert and oriented. Patient presents in a pleasant mood on assessment stating that she slept "good" last night and had no complaints to voice to this Probation officer. Patient denies depression/anxiety, stating that overall she is feeling "pretty good". Patient also denies SI, HI, AVH, and pain at this time. Patient had no stated goals for today.  A- Scheduled medications administered to patient, per MD orders. Support and encouragement provided.  Routine safety checks conducted every 15 minutes.  Patient informed to notify staff with problems or concerns.  R- No adverse drug reactions noted. Patient contracts for safety at this time. Patient compliant with medications and treatment plan. Patient receptive, calm, and cooperative. Patient interacts well with others on the unit.  Patient remains safe at this time.  Problem: Education: Goal: Utilization of techniques to improve thought processes will improve Outcome: Progressing Goal: Knowledge of the prescribed therapeutic regimen will improve Outcome: Progressing   Problem: Activity: Goal: Interest or engagement in leisure activities will improve Outcome: Progressing Goal: Imbalance in normal sleep/wake cycle will improve Outcome: Progressing   Problem: Coping: Goal: Coping ability will improve Outcome: Progressing Goal: Will verbalize feelings Outcome: Progressing   Problem: Health Behavior/Discharge Planning: Goal: Ability to make decisions will improve Outcome: Progressing Goal: Compliance with therapeutic regimen will improve Outcome: Progressing   Problem: Role Relationship: Goal: Will demonstrate positive changes in social behaviors and relationships Outcome: Progressing   Problem: Safety: Goal: Ability to disclose and discuss suicidal ideas will improve Outcome: Progressing Goal: Ability to identify and utilize support systems that promote safety will improve Outcome: Progressing   Problem: Self-Concept: Goal: Will  verbalize positive feelings about self Outcome: Progressing Goal: Level of anxiety will decrease Outcome: Progressing   Problem: Education: Goal: Knowledge of the prescribed therapeutic regimen will improve Outcome: Progressing   Problem: Coping: Goal: Coping ability will improve Outcome: Progressing   Problem: Education: Goal: Knowledge of [place] [facility] Education information/materials will improve Outcome: Progressing Goal: Emotional status will improve Outcome: Progressing

## 2019-07-25 NOTE — Progress Notes (Signed)
Patient refused her evening medications stating to this writer "I already have blurred vision from when I took it this morning". This Probation officer asked patient if she expressed these findings to the doctor and when did this start. Patient stated that this started yesterday and that she has not told the doctor. Patient has not expressed any of this to this Probation officer until now. Patient's complaints will be reported off to the oncoming shift and MD will be notified.

## 2019-07-25 NOTE — BHH Group Notes (Signed)
LCSW Aftercare Discharge Planning Group Note  07/25/2019  Type of Group and Topic: Psychoeducational Group: Discharge Planning  Participation Level: Active  Description of Group  Discharge planning group reviews patient's anticipated discharge plans and assists patients to anticipate and address any barriers to wellness/recovery in the community. Suicide prevention education is reviewed with patients in group.  Therapeutic Goals  1. Patients will state their anticipated discharge plan and mental health aftercare  2. Patients will identify potential barriers to wellness in the community setting  3. Patients will engage in problem solving, solution focused discussion of ways to anticipate and address barriers to wellness/recovery  Summary of Patient Progress  Plan for Discharge/Comments: Patient reports she has made arrangements to discharge to a new apartment of her own, though she could not provide details about this arrangement. Patient shares she will live in [place] at discharge and plans to follow up with Tamela Gammon for outpatient medication management and therapy.  Transportation Means: Unknown  Supports: Unknown  Therapeutic Modalities:  [place], [place]  07/25/2019 1:33 PM

## 2019-07-26 NOTE — Progress Notes (Signed)
Recreation Therapy Notes  Date: 07/26/2019  Time: 9:30 am  Location: Craft room  Behavioral response: Appropriate  Intervention Topic: Necessities   Discussion/Intervention:  Group content on today was focused on necessities. The group defined necessities and how they determine their necessities. Individuals expressed how many necessities they have and if it changes from day to day. Patients described the difference between wants and needs. The group explained how they have overspent on wants in the past. Individuals described a reoccurring necessity for them. The intervention "What I need" helped patients differentiate between wants and needs.  Clinical Observations/Feedback:  Patient came to group and stated that a job is a necessity to her. She identified shampoo and conditioner as her basic necessities.  Individual participated in the intervention during group and was social with peers and staff. Adline Kirshenbaum LRT/CTRS         Rajon Bisig 07/26/2019 11:16 AM

## 2019-07-26 NOTE — Progress Notes (Signed)
[facility] MD Progress Note  07/26/2019 5:03 PM Julia [name]  MRN:  [id number] Subjective: Patient seen chart reviewed.  Patient with psychotic disorder.  This past week she has had intermittent agitation confusion and behavior problems.  On interview today the patient is blunted.  Says that her mood continues to feel down and anxious.  She is able to have a fairly lucid conversation about being homeless.  She admits to still having hallucinations.  No suicidal thoughts currently no homicidal thoughts.  Taking care of her hygiene adequately.  Patient has been refusing the Trileptal and Geodon that are currently ordered. Principal Problem: Psychosis ([place]) Diagnosis: Principal Problem:   Psychosis ([place]) Active Problems:   Polysubstance abuse ([place])   Depressed mood   Substance induced mood disorder ([place])  Total Time spent with patient: 30 minutes  Past Psychiatric History: Past history of several hospitalizations worsening confusion and psychosis  Past Medical History:  Past Medical History:  Diagnosis Date  . Burning with urination 05/03/2015  . Contraceptive management 05/03/2015  . Depression   . Dysmenorrhea 12/30/2013  . Heroin addiction ([place])   . Menorrhagia 12/30/2013  . Menstrual extraction 12/30/2013  . Migraines   . Social anxiety disorder 09/25/2016  . Suicidal ideations   . Vaginal odor 05/03/2015    Past Surgical History:  Procedure Laterality Date  . NO PAST SURGERIES     Family History:  Family History  Problem Relation Age of Onset  . Depression Mother   . Hypertension Father   . Hyperlipidemia Father   . Cancer Paternal Grandmother        breast, uterine  . Cirrhosis Paternal Grandfather        due to alcohol   Family Psychiatric  History: See previous Social History:  Social History   Substance and Sexual Activity  Alcohol Use Yes   Comment: BAC was clear     Social History   Substance and Sexual Activity  Drug Use Yes  . Types: Marijuana, Oxycodone    Comment: reports oxycodone and marijuana    Social History   Socioeconomic History  . Marital status: Single    Spouse name: Not on file  . Number of children: Not on file  . Years of education: Not on file  . Highest education level: Not on file  Occupational History  . Occupation: Unemployed  Tobacco Use  . Smoking status: Current Every Day Smoker    Packs/day: 0.50    Types: Cigarettes  . Smokeless tobacco: Never Used  . Tobacco comment: Not ready to quit  Substance and Sexual Activity  . Alcohol use: Yes    Comment: BAC was clear  . Drug use: Yes    Types: Marijuana, Oxycodone    Comment: reports oxycodone and marijuana  . Sexual activity: Yes    Birth control/protection: Implant  Other Topics Concern  . Not on file  Social History Narrative   06/23/2019:  Pt stated that she is homeless, that she is a high school graduate, and that she is unemployed and not followed by any outpatient provider.      Lives with Dad. Mom has LGD, lives in [place]. 11th grader. Dog.   Social Determinants of Health   Financial Resource Strain:   . Difficulty of Paying Living Expenses: Not on file  Food Insecurity:   . Worried About Charity fundraiser in the Last Year: Not on file  . Ran Out of Food in the Last Year: Not on file  Transportation Needs:   . Film/video editor (Medical): Not on file  . Lack of Transportation (Non-Medical): Not on file  Physical Activity:   . Days of Exercise per Week: Not on file  . Minutes of Exercise per Session: Not on file  Stress:   . Feeling of Stress : Not on file  Social Connections:   . Frequency of Communication with Friends and Family: Not on file  . Frequency of Social Gatherings with Friends and Family: Not on file  . Attends Religious Services: Not on file  . Active Member of Clubs or Organizations: Not on file  . Attends Archivist Meetings: Not on file  . Marital Status: Not on file   Additional Social History:                          Sleep: Fair  Appetite:  Fair  Current Medications: Current Facility-Administered Medications  Medication Dose Route Frequency Provider Last Rate Last Admin  . acetaminophen (TYLENOL) tablet 650 mg  650 mg Oral Q6H PRN [name], MD   650 mg at 07/18/19 1841  . alum & mag hydroxide-simeth (MAALOX/MYLANTA) 200-200-20 MG/5ML suspension 30 mL  30 mL Oral Q4H PRN [name], MD      . benztropine (COGENTIN) tablet 2 mg  2 mg Oral BID PRN [name], MD   2 mg at 07/25/19 1832   Or  . benztropine mesylate (COGENTIN) injection 2 mg  2 mg Intramuscular BID PRN [name], MD      . diphenhydrAMINE (BENADRYL) capsule 50 mg  50 mg Oral Q6H PRN [name], MD   50 mg at 07/26/19 1152   Or  . diphenhydrAMINE (BENADRYL) injection 50 mg  50 mg Intramuscular Q6H PRN [name], MD   50 mg at 07/25/19 1832  . FLUoxetine (PROZAC) capsule 40 mg  40 mg Oral Daily [name], MD   40 mg at 07/26/19 0901  . haloperidol (HALDOL) tablet 10 mg  10 mg Oral Q6H PRN Sharma Covert, MD   10 mg at 07/26/19 1152   Or  . haloperidol lactate (HALDOL) injection 10 mg  10 mg Intramuscular Q6H PRN Sharma Covert, MD   10 mg at 07/23/19 2052  . magnesium hydroxide (MILK OF MAGNESIA) suspension 30 mL  30 mL Oral Daily PRN [name], MD      . OXcarbazepine (TRILEPTAL) tablet 450 mg  450 mg Oral BID Sharma Covert, MD   450 mg at 07/25/19 0834  . traZODone (DESYREL) tablet 50 mg  50 mg Oral QHS PRN [name], MD   50 mg at 07/25/19 2130  . ziprasidone (GEODON) capsule 60 mg  60 mg Oral Q breakfast Sharma Covert, MD      . ziprasidone (GEODON) capsule 80 mg  80 mg Oral Q supper Sharma Covert, MD      . ziprasidone (GEODON) injection 20 mg  20 mg Intramuscular Q6H PRN Sharma Covert, MD        Lab Results: No results found for this or any previous visit (from the past 48 hour(s)).  Blood Alcohol level:  Lab Results   Component Value Date   [facility] <10 07/09/2019   ETH <10 [id number]    Metabolic Disorder Labs: Lab Results  Component Value Date   HGBA1C 5.2 05/17/2019   MPG 102.54 05/17/2019   MPG 108.28  02/17/2019   Lab Results  Component Value Date   PROLACTIN 31.9 (H) 02/17/2019   PROLACTIN 29.6 (H) 09/25/2016   Lab Results  Component Value Date   CHOL 144 05/17/2019   TRIG 65 05/17/2019   HDL 35 (L) 05/17/2019   CHOLHDL 4.1 05/17/2019   VLDL 13 05/17/2019   LDLCALC 96 05/17/2019   LDLCALC 76 02/17/2019    Physical Findings: AIMS:  , ,  ,  ,    CIWA:    COWS:     Musculoskeletal: Strength & Muscle Tone: within normal limits Gait & Station: normal Patient leans: N/A  Psychiatric Specialty Exam: Physical Exam  Nursing note and vitals reviewed. Constitutional: She appears well-developed and well-nourished.  HENT:  Head: Normocephalic and atraumatic.  Eyes: Pupils are equal, round, and reactive to light. Conjunctivae are normal.  Cardiovascular: Regular rhythm and normal heart sounds.  Respiratory: Effort normal. No respiratory distress.  GI: Soft.  Musculoskeletal:        General: Normal range of motion.     Cervical back: Normal range of motion.  Neurological: She is alert.  Skin: Skin is warm and dry.  Psychiatric: Her affect is blunt. Her speech is delayed. She is slowed. Thought content is paranoid. She expresses impulsivity. She expresses no homicidal and no suicidal ideation. She exhibits abnormal recent memory.    Review of Systems  Constitutional: Negative.   HENT: Negative.   Eyes: Negative.   Respiratory: Negative.   Cardiovascular: Negative.   Gastrointestinal: Negative.   Musculoskeletal: Negative.   Skin: Negative.   Neurological: Negative.   Psychiatric/Behavioral: Positive for dysphoric mood and hallucinations.    Blood pressure 101/71, pulse (!) 130, temperature 98.5 F (36.9 C), temperature source Oral, resp. rate 18, height 5\' 4"  (1.626 m), weight  67.2 kg, SpO2 95 %.Body mass index is 25.43 kg/m.  General Appearance: Casual and Fairly Groomed  Eye Contact:  Fair  Speech:  Slow  Volume:  Decreased  Mood:  Dysphoric  Affect:  Constricted  Thought Process:  Coherent  Orientation:  Full (Time, Place, and Person)  Thought Content:  Logical, Rumination and Tangential  Suicidal Thoughts:  No  Homicidal Thoughts:  No  Memory:  Immediate;   Fair Recent;   Fair Remote;   Fair  Judgement:  Impaired  Insight:  Fair  Psychomotor Activity:  Decreased  Concentration:  Concentration: Fair  Recall:  [employer] of Knowledge:  Fair  Language:  Fair  Akathisia:  No  Handed:  Right  AIMS (if indicated):     Assets:  Desire for Improvement  ADL's:  Impaired  Cognition:  Impaired,  Mild  Sleep:  Number of Hours: 7     Treatment Plan Summary: Daily contact with patient to assess and evaluate symptoms and progress in treatment, Medication management and Plan Patient currently refusing partial medicine.  I am going to discontinue the Trileptal.  She and I can read discussed antipsychotic options tomorrow.  Supportive therapy and counseling and encouragement.  Patient is still homeless and options for discharge are few.  Alethia Berthold, MD 07/26/2019, 5:03 PM

## 2019-07-26 NOTE — BHH Counselor (Signed)
CSW faxed to [facility] at [facility] the patient's SW assessment.  Fax was successful.   [name], MSW, LCSW 07/26/2019 2:29 PM

## 2019-07-26 NOTE — Progress Notes (Signed)
Patient continues to refuse Geodon and Trileptal, reporting that it makes her vision blurry. MD will be notified of these findings.

## 2019-07-26 NOTE — Plan of Care (Signed)
D- Patient alert and oriented. Patient presents in a pleasant mood on assessment stating that she slept ok last night and had no complaints to voice to this Probation officer. Patient denies anxiety, however, she endorsed depression, stating that "being homeless" is making her feel this way. Patient denies SI, HI, AVH, and pain at this time. Patient's goal for today is "discharge plan".  A- Scheduled medications administered to patient, per MD orders. Support and encouragement provided.  Routine safety checks conducted every 15 minutes.  Patient informed to notify staff with problems or concerns.  R- No adverse drug reactions noted. Patient contracts for safety at this time. Patient compliant with medications and treatment plan. Patient receptive, calm, and cooperative. Patient interacts well with others on the unit.  Patient remains safe at this time.  Problem: Education: Goal: Utilization of techniques to improve thought processes will improve Outcome: Progressing Goal: Knowledge of the prescribed therapeutic regimen will improve Outcome: Progressing   Problem: Activity: Goal: Interest or engagement in leisure activities will improve Outcome: Progressing Goal: Imbalance in normal sleep/wake cycle will improve Outcome: Progressing   Problem: Coping: Goal: Coping ability will improve Outcome: Progressing Goal: Will verbalize feelings Outcome: Progressing   Problem: Health Behavior/Discharge Planning: Goal: Ability to make decisions will improve Outcome: Progressing Goal: Compliance with therapeutic regimen will improve Outcome: Progressing   Problem: Role Relationship: Goal: Will demonstrate positive changes in social behaviors and relationships Outcome: Progressing   Problem: Safety: Goal: Ability to disclose and discuss suicidal ideas will improve Outcome: Progressing Goal: Ability to identify and utilize support systems that promote safety will improve Outcome: Progressing   Problem:  Self-Concept: Goal: Will verbalize positive feelings about self Outcome: Progressing Goal: Level of anxiety will decrease Outcome: Progressing   Problem: Education: Goal: Knowledge of the prescribed therapeutic regimen will improve Outcome: Progressing   Problem: Coping: Goal: Coping ability will improve Outcome: Progressing   Problem: Education: Goal: Knowledge of [place] [facility] Education information/materials will improve Outcome: Progressing Goal: Emotional status will improve Outcome: Progressing

## 2019-07-26 NOTE — Progress Notes (Signed)
Patient was heard yelling in the hallway outside of the nurses station saying "I will fucking kill you", pounding her fist into the palm of her hand. This Probation officer asked patient what is making her upset and she stated "the Brandon Melnick is making fun of me and not taking me serious". Patient came into the medication room and stated "I'm not going to jail". Patient was not due for any PRN medication, she refused her evening medication, however, she stated to this writer that she would go to her room to chill out.

## 2019-07-26 NOTE — BHH Group Notes (Signed)
LCSW Group Therapy Note   07/26/2019 11:04 AM   Type of Therapy and Topic:  Group Therapy:  Overcoming Obstacles   Participation Level:  Did Not Attend   Description of Group:    In this group patients will be encouraged to explore what they see as obstacles to their own wellness and recovery. They will be guided to discuss their thoughts, feelings, and behaviors related to these obstacles. The group will process together ways to cope with barriers, with attention given to specific choices patients can make. Each patient will be challenged to identify changes they are motivated to make in order to overcome their obstacles. This group will be process-oriented, with patients participating in exploration of their own experiences as well as giving and receiving support and challenge from other group members.   Therapeutic Goals: 1. Patient will identify personal and current obstacles as they relate to admission. 2. Patient will identify barriers that currently interfere with their wellness or overcoming obstacles.  3. Patient will identify feelings, thought process and behaviors related to these barriers. 4. Patient will identify two changes they are willing to make to overcome these obstacles:      Summary of Patient Progress x     Therapeutic Modalities:   Cognitive Behavioral Therapy Solution Focused Therapy Motivational Interviewing Relapse Prevention Therapy  Evalina Field, MSW, LCSW Clinical Social Work 07/26/2019 11:04 AM

## 2019-07-26 NOTE — Progress Notes (Signed)
Patient reports that she slept throughout the night. Currently out of bed for vital signs. Denies SI/HI. Denies hallucinations. No sign of discomfort. Safety precautions maintained.

## 2019-07-26 NOTE — Progress Notes (Signed)
Patient is pacing up and down her hallway talking to herself, clearly responding to internal stimuli.

## 2019-07-26 NOTE — BHH Counselor (Signed)
CSW was approached by pt on two separate occassions.   Patient approached CSW asking for a new packet for [employer] because she threw the old packet away.  Patient then stated "I have a housing referral in with the police since I am helping them solve a case".  CSW attempted to rationalize with the patient that this is unlikely but patient did not seem to understand.  Patient asked for the phones to be turned on so she can call a list of [employer].  CSW pointed out that calls to [facility] are typically made in the afternoon/evening as the house members are at work. CSW pointed out that patient does not have a source of income to support her in the home.   CSW provided patient with a new application for [place], MSW, LCSW 07/26/2019 11:21 AM

## 2019-07-26 NOTE — BHH Counselor (Signed)
CSW received call from Kathryn at [employer].  CSW confirmed that patient remains at the hospital and is still homeless.  Cecilie Lowers reports that he will reach out to the shelter in [place] to see if patient could qualify for a bed.  He reports that standard operating procedure now is that those going to the shelter have to quarantine at a hotel for several days before they can be admitted.  He reports that it may be possible for the patient to forgo this step since she has been on the unit.    [name], MSW, LCSW 07/26/2019 11:23 AM

## 2019-07-26 NOTE — Progress Notes (Signed)
Patient refused her Triletpal and Geodon, stating to this Probation officer "because it made my vision blurry yesterday". This Probation officer will notify MD of these findings.

## 2019-07-26 NOTE — Progress Notes (Signed)
CSW's Herschel Senegal and Minette Brine had a conversation with pt regarding discharge plan. Pt reported she is going to stay with her dad at discharge. CSW explained pt cannot stay with her dad as dad reported he stays in an "old folks home" pt responded with "he said that again?". Pt reported she told her dad she got into a housing program that's going to give her an apartment. CSW inquired if pt had an income source, pt stated no and said her dad was going to give her about $300 for her apartment. CSW educated pt on that most likely not being enough to live in an apartment and afford food, bills, etc. CSW encourage pt to complete application for [employer] program in [place] as that is a shelter option for her. Pt reported she would complete the application today.   Evalina Field, MSW, LCSW Clinical Social Work 07/26/2019 9:18 AM

## 2019-07-26 NOTE — BHH Counselor (Signed)
CSW followed up with patient to see if any assistance was needed in completing the [employer] application.   Pt reports that she has completed it and gave it to CSW.  Patient again reports that she is going to stay with her father.  CSW called father with patient present. Again father confirmed that patient can NOT go to his home.  He again reports that there is no family support. He reports that he could contribute $200 a month to the patient.  While on the phone patient began to talk about being a part of witness protection and being a part of a "shakedown" on Christmas Eve.  She reported that she wanted to be discharged to her dads so they could "go to the police".  Patient became upset and stated "why does no one believe me, why does this happen to me?" while looking at the ceiling.   CSW advised that it is unlikely that a boarding house would be $200/month. Pt reports plans to call [facility].  CSW informed that CSW staff is working on shelter options.   CSW observed the patient to become agitated when speaking to her father as evidenced by raised voice and patient slapping her thigh. Later patient was observed pacing her hallway and slapping her thigh.   CSW did note that patient's pupils seemed enlarged.  CSW informed patient's nurse.   [name], MSW, LCSW 07/26/2019 2:00 PM

## 2019-07-26 NOTE — Plan of Care (Signed)
Patient is in the milieu calm and cooperative. No sign of distress.

## 2019-07-26 NOTE — Progress Notes (Signed)
When this writer asked patient what was upsetting her, she stated, "my family, they don't believe me when I say I was bugged as a baby in [place] and people are after me".

## 2019-07-27 NOTE — Progress Notes (Signed)
Recreation Therapy Notes  Date: 07/27/2019  Time: 9:30 am   Location: Craft room   Behavioral response: N/A   Intervention Topic: Self-care   Discussion/Intervention: Patient did not attend group.   Clinical Observations/Feedback:  Patient did not attend group.   Latalia Etzler LRT/CTRS        Riely Baskett 07/27/2019 10:55 AM

## 2019-07-27 NOTE — Progress Notes (Signed)
Patient has started pacing up and down her hallway. She appears to be smiling to herself, possibly responding to internal stimuli.

## 2019-07-27 NOTE — Plan of Care (Signed)
D- Patient alert and oriented. Patient presents in a pleasant mood on assessment stating that she slept "good" last night and had no complaints or concerns to voice to this Probation officer. Patient denies depression/anxiety, stating, "it's ok", however, when this writer asked if it was better than yesterday, patient stated "no", and did not go into any detail. Patient also denies SI, HI, AVH, and pain at this time. Patient had no stated goals for today.  A- Scheduled medications administered to patient, per MD orders. Support and encouragement provided.  Routine safety checks conducted every 15 minutes.  Patient informed to notify staff with problems or concerns.  R- No adverse drug reactions noted. Patient contracts for safety at this time. Patient compliant with medications and treatment plan. Patient receptive, calm, and cooperative. Patient interacts well with others on the unit.  Patient remains safe at this time.  Problem: Education: Goal: Utilization of techniques to improve thought processes will improve Outcome: Progressing Goal: Knowledge of the prescribed therapeutic regimen will improve Outcome: Progressing   Problem: Activity: Goal: Interest or engagement in leisure activities will improve Outcome: Progressing Goal: Imbalance in normal sleep/wake cycle will improve Outcome: Progressing   Problem: Coping: Goal: Coping ability will improve Outcome: Progressing Goal: Will verbalize feelings Outcome: Progressing   Problem: Health Behavior/Discharge Planning: Goal: Ability to make decisions will improve Outcome: Progressing Goal: Compliance with therapeutic regimen will improve Outcome: Progressing   Problem: Role Relationship: Goal: Will demonstrate positive changes in social behaviors and relationships Outcome: Progressing   Problem: Safety: Goal: Ability to disclose and discuss suicidal ideas will improve Outcome: Progressing Goal: Ability to identify and utilize support  systems that promote safety will improve Outcome: Progressing   Problem: Self-Concept: Goal: Will verbalize positive feelings about self Outcome: Progressing Goal: Level of anxiety will decrease Outcome: Progressing   Problem: Education: Goal: Knowledge of the prescribed therapeutic regimen will improve Outcome: Progressing   Problem: Coping: Goal: Coping ability will improve Outcome: Progressing   Problem: Education: Goal: Knowledge of [place] [facility] Education information/materials will improve Outcome: Progressing Goal: Emotional status will improve Outcome: Progressing

## 2019-07-27 NOTE — Progress Notes (Signed)
CSW contacted pts care coordinator, Mayer Camel who reported that she has no supports listed for pt besides her father. CSW inquired if Chervonne agreed pt may benefit from a legal guardian, Chervonne agreed and reported she was unsure of the process, but for CSW to contact DSS.  CSW contacted Barrie Lyme [phone] supervisor of guardianship services with DSS and lvm requesting call back about process of getting pt assigned a legal guardian.   Evalina Field, MSW, LCSW Clinical Social Work 07/27/2019 1:13 PM

## 2019-07-27 NOTE — Progress Notes (Signed)
Patient is asleep, so this writer will administer morning medication once she wakes up. MD will be notified.
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
## 2019-07-27 NOTE — Progress Notes (Signed)
CSW received a call back from [place] with [facility] who reported she received pts application and it will be reviewed by the nurse today and she will give CSW a call back regarding application status.  Evalina Field, MSW, LCSW Clinical Social Work 07/27/2019 10:42 AM

## 2019-07-27 NOTE — Progress Notes (Signed)
Patient continues to deny Geodon, MD has been made aware.

## 2019-07-27 NOTE — BHH Counselor (Signed)
CSW made a APS report with [facility] regarding the patient's ability to care for herself.  CSW reported concerns for patient's ability/capacity to care for herself.    CSW reviewed the following with the Caryl Pina at [facility]: -the patient's mental health -the patient's hospital stay frequency -patient can not return to the [facility] and lacks the proper documentation for other shelters -the family dynamics of the patient  [name], MSW, LCSW 07/27/2019 3:30 PM

## 2019-07-27 NOTE — Progress Notes (Signed)
Patient just came to the nurses station and stated "I'm walking on the bottom of my foot and the top hurts".

## 2019-07-27 NOTE — BHH Counselor (Signed)
CSW contacted Amy with [employer] to assist with determining if patient is eligible for Medicaid or disability.  [name], MSW, LCSW 07/27/2019 2:44 PM

## 2019-07-27 NOTE — Progress Notes (Signed)
Patient took her evening dose of Geodon without hesitation. Patient continues to remain safe on the unit.

## 2019-07-27 NOTE — BHH Group Notes (Signed)
Feelings Around Diagnosis 07/27/2019 1PM  Type of Therapy/Topic:  Group Therapy:  Feelings about Diagnosis  Participation Level:  Did Not Attend   Description of Group:   This group will allow patients to explore their thoughts and feelings about diagnoses they have received. Patients will be guided to explore their level of understanding and acceptance of these diagnoses. Facilitator will encourage patients to process their thoughts and feelings about the reactions of others to their diagnosis and will guide patients in identifying ways to discuss their diagnosis with significant others in their lives. This group will be process-oriented, with patients participating in exploration of their own experiences, giving and receiving support, and processing challenge from other group members.   Therapeutic Goals: 1. Patient will demonstrate understanding of diagnosis as evidenced by identifying two or more symptoms of the disorder 2. Patient will be able to express two feelings regarding the diagnosis 3. Patient will demonstrate their ability to communicate their needs through discussion and/or role play  Summary of Patient Progress:       Therapeutic Modalities:   Cognitive Behavioral Therapy Brief Therapy Feelings Identification    Yvette Rack, LCSW 07/27/2019 2:11 PM
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
## 2019-07-27 NOTE — Progress Notes (Signed)
Patient requested sleeping medication then went to bed. Slept throughout the night and did not exhibit any sign of discomfort. Patient got up for vital signs then returned to bed. Had no concern throughout this shift.

## 2019-07-27 NOTE — Tx Team (Signed)
Interdisciplinary Treatment and Diagnostic Plan Update  07/27/2019 Time of Session: 830AM Julia [name] MRN: [id number]  Principal Diagnosis: Psychosis [facility])  Secondary Diagnoses: Principal Problem:   Psychosis ([place]) Active Problems:   Polysubstance abuse ([place])   Depressed mood   Substance induced mood disorder ([place])   Current Medications:  Current Facility-Administered Medications  Medication Dose Route Frequency Provider Last Rate Last Admin  . acetaminophen (TYLENOL) tablet 650 mg  650 mg Oral Q6H PRN [name], MD   650 mg at 07/18/19 1841  . alum & mag hydroxide-simeth (MAALOX/MYLANTA) 200-200-20 MG/5ML suspension 30 mL  30 mL Oral Q4H PRN [name], MD      . benztropine (COGENTIN) tablet 2 mg  2 mg Oral BID PRN [name], MD   2 mg at 07/25/19 1832   Or  . benztropine mesylate (COGENTIN) injection 2 mg  2 mg Intramuscular BID PRN [name], MD      . diphenhydrAMINE (BENADRYL) capsule 50 mg  50 mg Oral Q6H PRN [name], MD   50 mg at 07/26/19 1152   Or  . diphenhydrAMINE (BENADRYL) injection 50 mg  50 mg Intramuscular Q6H PRN [name], MD   50 mg at 07/25/19 1832  . FLUoxetine (PROZAC) capsule 40 mg  40 mg Oral Daily [name], MD   40 mg at 07/26/19 0901  . haloperidol (HALDOL) tablet 10 mg  10 mg Oral Q6H PRN Sharma Covert, MD   10 mg at 07/26/19 1152   Or  . haloperidol lactate (HALDOL) injection 10 mg  10 mg Intramuscular Q6H PRN Sharma Covert, MD   10 mg at 07/23/19 2052  . magnesium hydroxide (MILK OF MAGNESIA) suspension 30 mL  30 mL Oral Daily PRN [name], MD      . traZODone (DESYREL) tablet 50 mg  50 mg Oral QHS PRN [name], MD   50 mg at 07/26/19 2142  . ziprasidone (GEODON) capsule 60 mg  60 mg Oral Q breakfast Sharma Covert, MD      . ziprasidone (GEODON) capsule 80 mg  80 mg Oral Q supper Sharma Covert, MD      . ziprasidone (GEODON) injection 20 mg  20 mg Intramuscular Q6H  PRN Sharma Covert, MD       PTA Medications: Medications Prior to Admission  Medication Sig Dispense Refill Last Dose  . etonogestrel (NEXPLANON) 68 MG IMPL implant 1 each (68 mg total) by Subdermal route once for 1 dose. (Birth control method) 1 each 0   . FLUoxetine (PROZAC) 10 MG capsule Take 3 capsules (30 mg total) by mouth daily. 90 capsule 1   . hydrOXYzine (ATARAX/VISTARIL) 25 MG tablet Take 1 tablet (25 mg total) by mouth every 6 (six) hours as needed for anxiety. 60 tablet 1   . QUEtiapine (SEROQUEL) 50 MG tablet Take 1 tablet (50 mg total) by mouth daily. 30 tablet 1   . traZODone (DESYREL) 50 MG tablet Take 50 mg by mouth at bedtime as needed. for sleep       Patient Stressors: Financial difficulties Loss of Mother Marital or family conflict Substance abuse  Patient Strengths: Ability for insight Communication skills  Treatment Modalities: Medication Management, Group therapy, Case management,  1 to 1 session with clinician, Psychoeducation, Recreational therapy.   Physician Treatment Plan for Primary Diagnosis: Psychosis [facility]) Long Term Goal(s): Improvement in symptoms so as ready for discharge Improvement in  symptoms so as ready for discharge   Short Term Goals: Ability to verbalize feelings will improve Ability to disclose and discuss suicidal ideas Ability to demonstrate self-control will improve Ability to identify and develop effective coping behaviors will improve Compliance with prescribed medications will improve  Medication Management: Evaluate patient's response, side effects, and tolerance of medication regimen.  Therapeutic Interventions: 1 to 1 sessions, Unit Group sessions and Medication administration.  Evaluation of Outcomes: Progressing  Physician Treatment Plan for Secondary Diagnosis: Principal Problem:   Psychosis ([place]) Active Problems:   Polysubstance abuse ([place])   Depressed mood   Substance induced mood disorder ([place])  Long Term  Goal(s): Improvement in symptoms so as ready for discharge Improvement in symptoms so as ready for discharge   Short Term Goals: Ability to verbalize feelings will improve Ability to disclose and discuss suicidal ideas Ability to demonstrate self-control will improve Ability to identify and develop effective coping behaviors will improve Compliance with prescribed medications will improve     Medication Management: Evaluate patient's response, side effects, and tolerance of medication regimen.  Therapeutic Interventions: 1 to 1 sessions, Unit Group sessions and Medication administration.  Evaluation of Outcomes: Progressing   RN Treatment Plan for Primary Diagnosis: Psychosis ([place]) Long Term Goal(s): Knowledge of disease and therapeutic regimen to maintain health will improve  Short Term Goals: Ability to demonstrate self-control, Ability to participate in decision making will improve, Ability to verbalize feelings will improve, Ability to disclose and discuss suicidal ideas, Ability to identify and develop effective coping behaviors will improve and Compliance with prescribed medications will improve  Medication Management: RN will administer medications as ordered by provider, will assess and evaluate patient's response and provide education to patient for prescribed medication. RN will report any adverse and/or side effects to prescribing provider.  Therapeutic Interventions: 1 on 1 counseling sessions, Psychoeducation, Medication administration, Evaluate responses to treatment, Monitor vital signs and CBGs as ordered, Perform/monitor CIWA, COWS, AIMS and Fall Risk screenings as ordered, Perform wound care treatments as ordered.  Evaluation of Outcomes: Progressing   LCSW Treatment Plan for Primary Diagnosis: Psychosis ([place]) Long Term Goal(s): Safe transition to appropriate next level of care at discharge, Engage patient in therapeutic group addressing interpersonal concerns.  Short  Term Goals: Engage patient in aftercare planning with referrals and resources, Increase social support, Increase ability to appropriately verbalize feelings, Increase emotional regulation, Facilitate acceptance of mental health diagnosis and concerns and Facilitate patient progression through stages of change regarding substance use diagnoses and concerns  Therapeutic Interventions: Assess for all discharge needs, 1 to 1 time with Social worker, Explore available resources and support systems, Assess for adequacy in community support network, Educate family and significant other(s) on suicide prevention, Complete Psychosocial Assessment, Interpersonal group therapy.  Evaluation of Outcomes: Progressing   Progress in Treatment: Attending groups: No Participating in groups: No. Taking medication as prescribed: Yes. Toleration medication: Yes. Family/Significant other contact made: Yes, individual(s) contacted:  SPE completed with patient's father.  Patient understands diagnosis: Yes. Discussing patient identified problems/goals with staff: Yes. Medical problems stabilized or resolved: Yes. Denies suicidal/homicidal ideation: Yes. Issues/concerns per patient self-inventory: No. Other: none  New problem(s) identified: No, Describe:  none  New Short Term/Long Term Goal(s):  elimination of symptoms of psychosis, medication management for mood stabilization; elimination of SI thoughts; development of comprehensive mental wellness/sobriety plan.  Update 07/27/2019: No changes at this time.   Patient Goals:  "to have a plan"  Update 07/27/2019: No changes at this time.  Discharge Plan or Barriers: Patient is currently homeless.  Patient aftercare depends on where patient will be staying. CSW is attempting to identify an available bed at local homeless shelters.  Patient reports that she can not return to [facility]. Update 07/22/19-Pt displaying psychotic behavior. No other social supports  identified. CSW's attempted to contact pt boyfriend and father. Awaiting response from boyfriend and pt father lives in a nursing home.  Discharge plan TBD Update 07/27/2019:  Patient continues to adjust to her medications. She continues to have some delusions in regards of being a part of witness protection and having the police locate housing for her. Patient is homeless with no supports from home to assist with housing.  Application for [employer] in [place] has been sent.  No word yet on possible housing.  Patient can not stay with father, return to [facility] and calls to her boyfriend have not been returned.   Reason for Continuation of Hospitalization: Anxiety Depression Hallucinations Medication stabilization  Estimated Length of Stay: TBD  Attendees: Patient: 07/17/2019   Physician:.Dr. Weber Cooks, MD 07/17/2019   Nursing: 07/17/2019   RN Care Manager: 07/17/2019   Social Worker: [name], LCSW 07/17/2019   Recreational Therapist:  07/17/2019   Other:  07/17/2019   Other:  07/17/2019   Other: 07/17/2019        Scribe for Treatment Team: Rozann Lesches, LCSW 07/27/2019 10:30 AM

## 2019-07-28 ENCOUNTER — Inpatient Hospital Stay: Payer: Medicaid Other

## 2019-07-28 MED ORDER — IBUPROFEN 600 MG PO TABS
600.0000 mg | ORAL_TABLET | Freq: Four times a day (QID) | ORAL | Status: DC | PRN
  Administered 2019-07-28 – 2019-08-25 (×13): 600 mg via ORAL
  Filled 2019-07-28 (×13): qty 1

## 2019-07-28 NOTE — Plan of Care (Signed)
Aware of  medication received. Patient aware of [facility] education , able to verbalize understanding  with redirection from staff. Emotional and mental  health status  improving . Able to maintain frustration  no anger management  concerns. Voice of no safety concerns . Working on Scientific laboratory technician.   Problem: Education: Goal: Utilization of techniques to improve thought processes will improve Outcome: Progressing Goal: Knowledge of the prescribed therapeutic regimen will improve Outcome: Progressing   Problem: Activity: Goal: Interest or engagement in leisure activities will improve Outcome: Progressing Goal: Imbalance in normal sleep/wake cycle will improve Outcome: Progressing   Problem: Coping: Goal: Coping ability will improve Outcome: Progressing Goal: Will verbalize feelings Outcome: Progressing   Problem: Health Behavior/Discharge Planning: Goal: Ability to make decisions will improve Outcome: Progressing Goal: Compliance with therapeutic regimen will improve Outcome: Progressing   Problem: Self-Concept: Goal: Will verbalize positive feelings about self Outcome: Progressing Goal: Level of anxiety will decrease Outcome: Progressing   Problem: Education: Goal: Knowledge of the prescribed therapeutic regimen will improve Outcome: Progressing   Problem: Coping: Goal: Coping ability will improve Outcome: Progressing   Problem: Education: Goal: Knowledge of [facility] Education information/materials will improve Outcome: Progressing Goal: Emotional status will improve Outcome: Progressing

## 2019-07-28 NOTE — Progress Notes (Signed)
Recreation Therapy Notes    Date: 07/28/2019  Time: 9:30 am   Location: Craft room   Behavioral response: N/A   Intervention Topic: Relaxation   Discussion/Intervention: Patient did not attend group.   Clinical Observations/Feedback:  Patient did not attend group.   Naava Janeway LRT/CTRS        Shanera Meske 07/28/2019 11:09 AM

## 2019-07-28 NOTE — Progress Notes (Signed)
D  Patient remained in bed at breakfast  Up at around 10:00 Voice of her  Left foot hurting . Writer observed no busing  Or swelling at great toe site . Writer watched patient walk  Without her knowing .Gait normal . Xray ordered for foot . Patient compliant  with   Medication  Given this shift . Out  Of room  More later part of shift . No unit participation  Patient remains to have odd affect . Patient noted staring  At staff several time during shift . Patient stated slept good last night .Stated appetite is good and energy level  Is normal. Stated concentration is good . Stated no  Depression  , hopeless and anxiety .Denies suicidal  homicidal ideations  .  No auditory hallucinations  No pain concerns . Appropriate ADL'S. Interacting with peers and staff. Limited Interactions  With peers  A: Encourage patient participation with unit programming . Instruction  Given on  Medication , verbalize understanding. R: Voice no other concerns. Staff continue to monitor

## 2019-07-28 NOTE — Progress Notes (Signed)
Patient was in her room upon arrival to the unit. Patient more pleasant this week compared to when this writer had her last week. Patient denies SI/HI/AVH, anxiety and pain. Patient stated she had some pain in her foot but when assessed no ecchymosis or swelling was present. Patient being monitored Q 15 minutes for safety per unit protocol. Patient remains safe on the unit.

## 2019-07-28 NOTE — [facility] Group Notes (Signed)
LCSW Group Therapy Note  07/28/2019 1:00 PM  Type of Therapy/Topic:  Group Therapy:  Emotion Regulation  Participation Level:  None   Description of Group:   The purpose of this group is to assist patients in learning to regulate negative emotions and experience positive emotions. Patients will be guided to discuss ways in which they have been vulnerable to their negative emotions. These vulnerabilities will be juxtaposed with experiences of positive emotions or situations, and patients will be challenged to use positive emotions to combat negative ones. Special emphasis will be placed on coping with negative emotions in conflict situations, and patients will process healthy conflict resolution skills.  Therapeutic Goals: 1. Patient will identify two positive emotions or experiences to reflect on in order to balance out negative emotions 2. Patient will label two or more emotions that they find the most difficult to experience 3. Patient will demonstrate positive conflict resolution skills through discussion and/or role plays  Summary of Patient Progress: Patient was present in group, however did not engage in discussion.   Therapeutic Modalities:   Cognitive Behavioral Therapy Feelings Identification Dialectical Behavioral Therapy  [name], MSW, LCSW 07/28/2019 12:34 PM

## 2019-07-28 NOTE — Plan of Care (Signed)
Patient denies anxiety with this writer  Problem: Self-Concept: Goal: Level of anxiety will decrease Outcome: Progressing   

## 2019-07-28 NOTE — Progress Notes (Signed)
[facility] MD Progress Note  07/28/2019 1:42 PM Julia [name]  MRN:  [id number] Subjective: Patient seen chart reviewed.  19 year old woman with chronic behavioral and psychiatric problems.  To my interview today the patient reports that she is feeling mentally a little [place].  She says she still feels very anxious and worried about "family affairs" but will not specify what that means.  She did not specifically complain of anything that sounded psychotic to me.  She has a blank affect about her but is taking care of her hygiene adequately.  Eating well.  She denied any acute suicidal ideation.  She did physically complain of pain in her left second toe after one of her episodes of getting agitated yesterday.  She did take her Geodon and is not acutely reporting the blurry vision that she had worried about before. Principal Problem: Psychosis ([place]) Diagnosis: Principal Problem:   Psychosis ([place]) Active Problems:   Polysubstance abuse ([place])   Depressed mood   Substance induced mood disorder ([place])  Total Time spent with patient: 30 minutes  Past Psychiatric History: Patient has a history of several prior admissions.  Her situation appears to be complex and developing as she is only 19 years of age.  She appears to have some degree of intentionality to her behavior but also clearly seems to have some psychotic symptoms.  Has a history of suicide attempts.  Situation is very much complicated by her social problems  Past Medical History:  Past Medical History:  Diagnosis Date  . Burning with urination 05/03/2015  . Contraceptive management 05/03/2015  . Depression   . Dysmenorrhea 12/30/2013  . Heroin addiction ([place])   . Menorrhagia 12/30/2013  . Menstrual extraction 12/30/2013  . Migraines   . Social anxiety disorder 09/25/2016  . Suicidal ideations   . Vaginal odor 05/03/2015    Past Surgical History:  Procedure Laterality Date  . NO PAST SURGERIES     Family History:  Family History  Problem  Relation Age of Onset  . Depression Mother   . Hypertension Father   . Hyperlipidemia Father   . Cancer Paternal Grandmother        breast, uterine  . Cirrhosis Paternal Grandfather        due to alcohol   Family Psychiatric  History: See previous Social History:  Social History   Substance and Sexual Activity  Alcohol Use Yes   Comment: BAC was clear     Social History   Substance and Sexual Activity  Drug Use Yes  . Types: Marijuana, Oxycodone   Comment: reports oxycodone and marijuana    Social History   Socioeconomic History  . Marital status: Single    Spouse name: Not on file  . Number of children: Not on file  . Years of education: Not on file  . Highest education level: Not on file  Occupational History  . Occupation: Unemployed  Tobacco Use  . Smoking status: Current Every Day Smoker    Packs/day: 0.50    Types: Cigarettes  . Smokeless tobacco: Never Used  . Tobacco comment: Not ready to quit  Substance and Sexual Activity  . Alcohol use: Yes    Comment: BAC was clear  . Drug use: Yes    Types: Marijuana, Oxycodone    Comment: reports oxycodone and marijuana  . Sexual activity: Yes    Birth control/protection: Implant  Other Topics Concern  . Not on file  Social History Narrative   06/23/2019:  Pt stated  that she is homeless, that she is a high school graduate, and that she is unemployed and not followed by any outpatient provider.      Lives with Dad. Mom has LGD, lives in [place]. 11th grader. Dog.   Social Determinants of Health   Financial Resource Strain:   . Difficulty of Paying Living Expenses: Not on file  Food Insecurity:   . Worried About Charity fundraiser in the Last Year: Not on file  . Ran Out of Food in the Last Year: Not on file  Transportation Needs:   . Lack of Transportation (Medical): Not on file  . Lack of Transportation (Non-Medical): Not on file  Physical Activity:   . Days of Exercise per Week: Not on file  . Minutes of  Exercise per Session: Not on file  Stress:   . Feeling of Stress : Not on file  Social Connections:   . Frequency of Communication with Friends and Family: Not on file  . Frequency of Social Gatherings with Friends and Family: Not on file  . Attends Religious Services: Not on file  . Active Member of Clubs or Organizations: Not on file  . Attends Archivist Meetings: Not on file  . Marital Status: Not on file   Additional Social History:                         Sleep: Fair  Appetite:  Fair  Current Medications: Current Facility-Administered Medications  Medication Dose Route Frequency Provider Last Rate Last Admin  . acetaminophen (TYLENOL) tablet 650 mg  650 mg Oral Q6H PRN [name], MD   650 mg at 07/27/19 1837  . alum & mag hydroxide-simeth (MAALOX/MYLANTA) 200-200-20 MG/5ML suspension 30 mL  30 mL Oral Q4H PRN [name], MD      . benztropine (COGENTIN) tablet 2 mg  2 mg Oral BID PRN [name], MD   2 mg at 07/25/19 1832   Or  . benztropine mesylate (COGENTIN) injection 2 mg  2 mg Intramuscular BID PRN [name], MD      . diphenhydrAMINE (BENADRYL) capsule 50 mg  50 mg Oral Q6H PRN [name], MD   50 mg at 07/26/19 1152   Or  . diphenhydrAMINE (BENADRYL) injection 50 mg  50 mg Intramuscular Q6H PRN [name], MD   50 mg at 07/25/19 1832  . FLUoxetine (PROZAC) capsule 40 mg  40 mg Oral Daily [name], MD   40 mg at 07/28/19 0806  . haloperidol (HALDOL) tablet 10 mg  10 mg Oral Q6H PRN Sharma Covert, MD   10 mg at 07/26/19 1152   Or  . haloperidol lactate (HALDOL) injection 10 mg  10 mg Intramuscular Q6H PRN Sharma Covert, MD   10 mg at 07/23/19 2052  . magnesium hydroxide (MILK OF MAGNESIA) suspension 30 mL  30 mL Oral Daily PRN [name], MD      . traZODone (DESYREL) tablet 50 mg  50 mg Oral QHS PRN [name], MD   50 mg at 07/27/19 2142  . ziprasidone (GEODON) capsule 60 mg  60 mg  Oral Q breakfast Sharma Covert, MD   60 mg at 07/28/19 0806  . ziprasidone (GEODON) capsule 80 mg  80 mg Oral Q supper Sharma Covert, MD   80 mg at 07/27/19 1721  . ziprasidone (GEODON) injection 20 mg  20  mg Intramuscular Q6H PRN Sharma Covert, MD        Lab Results: No results found for this or any previous visit (from the past 48 hour(s)).  Blood Alcohol level:  Lab Results  Component Value Date   ETH <10 07/09/2019   ETH <10 [id number]    Metabolic Disorder Labs: Lab Results  Component Value Date   HGBA1C 5.2 05/17/2019   MPG 102.54 05/17/2019   MPG 108.28 02/17/2019   Lab Results  Component Value Date   PROLACTIN 31.9 (H) 02/17/2019   PROLACTIN 29.6 (H) 09/25/2016   Lab Results  Component Value Date   CHOL 144 05/17/2019   TRIG 65 05/17/2019   HDL 35 (L) 05/17/2019   CHOLHDL 4.1 05/17/2019   VLDL 13 05/17/2019   LDLCALC 96 05/17/2019   LDLCALC 76 02/17/2019    Physical Findings: AIMS:  , ,  ,  ,    CIWA:    COWS:     Musculoskeletal: Strength & Muscle Tone: within normal limits Gait & Station: normal Patient leans: N/A  Psychiatric Specialty Exam: Physical Exam  Nursing note and vitals reviewed. Constitutional: She appears well-developed and well-nourished.  HENT:  Head: Normocephalic and atraumatic.  Eyes: Pupils are equal, round, and reactive to light. Conjunctivae are normal.  Cardiovascular: Regular rhythm and normal heart sounds.  Respiratory: Effort normal. No respiratory distress.  GI: Soft.  Musculoskeletal:        General: Normal range of motion.     Cervical back: Normal range of motion.  Neurological: She is alert.  Skin: Skin is warm and dry.  Psychiatric: Thought content normal. Her affect is blunt. Her speech is delayed. She is slowed. Cognition and memory are normal. She expresses impulsivity and inappropriate judgment.    Review of Systems  Constitutional: Negative.   HENT: Negative.   Eyes: Negative.    Respiratory: Negative.   Cardiovascular: Negative.   Gastrointestinal: Negative.   Musculoskeletal: Negative.        Patient reports acute pain in her left foot that happened after one of her spells of agitation yesterday.  She is walking with a slight limp.  The way she describes it sounds like it is possible she could have a broken toe.  Skin: Negative.   Neurological: Negative.   Psychiatric/Behavioral: Positive for dysphoric mood and hallucinations. The patient is nervous/anxious.     Blood pressure 105/71, pulse (!) 106, temperature 98.2 F (36.8 C), temperature source Oral, resp. rate 18, height 5\' 4"  (1.626 m), weight 67.2 kg, SpO2 96 %.Body mass index is 25.43 kg/m.  General Appearance: Casual  Eye Contact:  Fair  Speech:  Slow  Volume:  Decreased  Mood:  Dysphoric  Affect:  Constricted  Thought Process:  Coherent  Orientation:  Full (Time, Place, and Person)  Thought Content:  Illogical and Tangential  Suicidal Thoughts:  No  Homicidal Thoughts:  No  Memory:  Immediate;   Fair Recent;   Fair Remote;   Fair  Judgement:  Impaired  Insight:  Shallow  Psychomotor Activity:  Normal  Concentration:  Concentration: Poor  Recall:  [employer] of Knowledge:  Fair  Language:  Fair  Akathisia:  No  Handed:  Right  AIMS (if indicated):     Assets:  Desire for Improvement Physical Health  ADL's:  Intact  Cognition:  Impaired,  Mild  Sleep:  Number of Hours: 8     Treatment Plan Summary: Daily contact with patient to assess and evaluate  symptoms and progress in treatment, Medication management and Plan 19 year old woman who is in a bad situation.  She has no place to live and nobody to help her and gets no kind of income.  She continues to have intermittent reports of symptoms.  Spoke with the full treatment team today.  Nursing continues to notice that her symptoms seem to be more prominent when she wants attention and at other times seem to go into complete remission.  She  does seem to still have a tendency to act out mostly in the evening.  I had asked her to please be cooperative with the antipsychotic medicine and she is starting to do that.  We have hope that we may get her mentally and behaviorally stabilized but we have a terrible social problem.  Social work has reported her to [facility]PS as having no one to help her.  Meanwhile I will get an x-ray of her left foot today since she is complaining of what sounds like it could possibly be a broken toe  Alethia Berthold, MD 07/28/2019, 1:42 PM

## 2019-07-29 NOTE — B[facility] Counselor (Signed)
CSW returned call to [facility].  CSW was asked to schedule a phone interview for patient.  CSW asked if it would be possible for patient to call due to nature of the discussion and having privacy.  CSW was informed that Britt Boozer will call back.  CSW attempted to return call for [name] at [place] and left HIPAA compliant voicemail.  CSW called Amy with Financial Counsel[employer].  CSW inquired about patient not having Medicaid or disability and possibility of setting patient up with either or at least starting the process. CSW reviewed the patient's living arrangements.  CSW was asked to call Suanne Marker at [phone] as Amy no longer does the Medicaid applications.  She reports that the Financial Counseling staff is not meeting with patient due to the "Covid-19 numbers"  CSW called Suanne Marker and left HIPAA compliant voicemail.  [name], MSW, LCSW 07/29/2019 1:37 PM

## 2019-07-29 NOTE — [facility] Group Notes (Signed)
LCSW Group Therapy Note  07/29/2019 11:38 AM  Type of Therapy/Topic:  Group Therapy:  Balance in Life  Participation Level:  Minimal  Description of Group:    This group will address the concept of balance and how it feels and looks when one is unbalanced. Patients will be encouraged to process areas in their lives that are out of balance and identify reasons for remaining unbalanced. Facilitators will guide patients in utilizing problem-solving interventions to address and correct the stressor making their life unbalanced. Understanding and applying boundaries will be explored and addressed for obtaining and maintaining a balanced life. Patients will be encouraged to explore ways to assertively make their unbalanced needs known to significant others in their lives, using other group members and facilitator for support and feedback.  Therapeutic Goals: 1. Patient will identify two or more emotions or situations they have that consume much of in their lives. 2. Patient will identify signs/triggers that life has become out of balance:  3. Patient will identify two ways to set boundaries in order to achieve balance in their lives:  4. Patient will demonstrate ability to communicate their needs through discussion and/or role plays  Summary of Patient Progress: Pt was present in group and reported that something she does to maintain balance is do a face mask for self-care. Pt reported that numerous areas of her life are off balanced including home, family, friends, recreation, and community service. Pt reported that she does not get to do the things she wants. Pt also stated she does not have a home and her family will not help her out.     Therapeutic Modalities:   Cognitive Behavioral Therapy Solution-Focused Therapy Assertiveness Training  Evalina Field, MSW, LCSW Clinical Social Work 07/29/2019 11:38 AM

## 2019-07-29 NOTE — Plan of Care (Signed)
Patient presents with better behavior this week and has been pleasant   Problem: Health Behavior/Discharge Planning: Goal: Compliance with therapeutic regimen will improve Outcome: Progressing

## 2019-07-29 NOTE — Progress Notes (Signed)
Patient was in her room upon arrival to the unit. Patient more pleasant this week compared to when this writer had her last week. Patient denies SI/HI/AVH, anxiety and pain. Patient stated that her foot was feeling better today. Patient being monitored Q 15 minutes for safety per unit protocol. Patient remains safe on the unit.

## 2019-07-29 NOTE — Progress Notes (Signed)
Patient has been experiencing hallucinations, agitations and helplessness. Received Haldol 10mg   By mouth as prescribed. Emotional support provided. Safety precautions reinforced.

## 2019-07-29 NOTE — Plan of Care (Signed)
Patient affect improving, no outbursts from her this week.   Problem: Health Behavior/Discharge Planning: Goal: Compliance with therapeutic regimen will improve Outcome: Progressing

## 2019-07-29 NOTE — Plan of Care (Signed)
Pt denies depression, anxiety, SI, HI and AVH. Pt was educated on care plan and verbalizes understanding. Collier Bullock RN Problem: Education: Goal: Utilization of techniques to improve thought processes will improve Outcome: Progressing Goal: Knowledge of the prescribed therapeutic regimen will improve Outcome: Progressing   Problem: Activity: Goal: Interest or engagement in leisure activities will improve Outcome: Progressing Goal: Imbalance in normal sleep/wake cycle will improve Outcome: Progressing   Problem: Coping: Goal: Coping ability will improve Outcome: Progressing Goal: Will verbalize feelings Outcome: Progressing   Problem: Health Behavior/Discharge Planning: Goal: Ability to make decisions will improve Outcome: Progressing Goal: Compliance with therapeutic regimen will improve Outcome: Progressing   Problem: Role Relationship: Goal: Will demonstrate positive changes in social behaviors and relationships Outcome: Progressing   Problem: Safety: Goal: Ability to disclose and discuss suicidal ideas will improve Outcome: Progressing Goal: Ability to identify and utilize support systems that promote safety will improve Outcome: Progressing   Problem: Safety: Goal: Ability to disclose and discuss suicidal ideas will improve Outcome: Progressing Goal: Ability to identify and utilize support systems that promote safety will improve Outcome: Progressing   Problem: Self-Concept: Goal: Will verbalize positive feelings about self Outcome: Progressing Goal: Level of anxiety will decrease Outcome: Progressing   Problem: Education: Goal: Knowledge of the prescribed therapeutic regimen will improve Outcome: Progressing

## 2019-07-29 NOTE — Progress Notes (Addendum)
[facility] MD Progress Note  07/29/2019 2:55 PM Julia [name]  MRN:  [id number]   Subjective: Follow-up for the patient diagnosed with psychosis.  Patient reports today that she is doing okay.  Denies any suicidal or homicidal ideations and denies having any hallucinations.  Patient states that she has been sleeping okay and that she has been eating okay as well.  Patient then reports to [name] again that she plans to go live with her boyfriend today.  Patient states that she will contact her boyfriend so that we can speak with him and that she spoke with him yesterday afternoon.  Principal Problem: Psychosis ([place]) Diagnosis: Principal Problem:   Psychosis ([place]) Active Problems:   Polysubstance abuse ([place])   Depressed mood   Substance induced mood disorder ([place])  Total Time spent with patient: 20 minutes  Past Psychiatric History: Patient has a history of several prior admissions.  Her situation appears to be complex and developing as she is only 19 years of age.  She appears to have some degree of intentionality to her behavior but also clearly seems to have some psychotic symptoms.  Has a history of suicide attempts.  Situation is very much complicated by her social problems  Past Medical History:  Past Medical History:  Diagnosis Date  . Burning with urination 05/03/2015  . Contraceptive management 05/03/2015  . Depression   . Dysmenorrhea 12/30/2013  . Heroin addiction ([place])   . Menorrhagia 12/30/2013  . Menstrual extraction 12/30/2013  . Migraines   . Social anxiety disorder 09/25/2016  . Suicidal ideations   . Vaginal odor 05/03/2015    Past Surgical History:  Procedure Laterality Date  . NO PAST SURGERIES     Family History:  Family History  Problem Relation Age of Onset  . Depression Mother   . Hypertension Father   . Hyperlipidemia Father   . Cancer Paternal Grandmother        breast, uterine  . Cirrhosis Paternal Grandfather        due to alcohol   Family Psychiatric   History: See previous Social History:  Social History   Substance and Sexual Activity  Alcohol Use Yes   Comment: BAC was clear     Social History   Substance and Sexual Activity  Drug Use Yes  . Types: Marijuana, Oxycodone   Comment: reports oxycodone and marijuana    Social History   Socioeconomic History  . Marital status: Single    Spouse name: Not on file  . Number of children: Not on file  . Years of education: Not on file  . Highest education level: Not on file  Occupational History  . Occupation: Unemployed  Tobacco Use  . Smoking status: Current Every Day Smoker    Packs/day: 0.50    Types: Cigarettes  . Smokeless tobacco: Never Used  . Tobacco comment: Not ready to quit  Substance and Sexual Activity  . Alcohol use: Yes    Comment: BAC was clear  . Drug use: Yes    Types: Marijuana, Oxycodone    Comment: reports oxycodone and marijuana  . Sexual activity: Yes    Birth control/protection: Implant  Other Topics Concern  . Not on file  Social History Narrative   06/23/2019:  Pt stated that she is homeless, that she is a high school graduate, and that she is unemployed and not followed by any outpatient provider.      Lives with Dad. Mom has LGD, lives in [place]. 11th grader.  Dog.   Social Determinants of Health   Financial Resource Strain:   . Difficulty of Paying Living Expenses: Not on file  Food Insecurity:   . Worried About Charity fundraiser in the Last Year: Not on file  . Ran Out of Food in the Last Year: Not on file  Transportation Needs:   . Lack of Transportation (Medical): Not on file  . Lack of Transportation (Non-Medical): Not on file  Physical Activity:   . Days of Exercise per Week: Not on file  . Minutes of Exercise per Session: Not on file  Stress:   . Feeling of Stress : Not on file  Social Connections:   . Frequency of Communication with Friends and Family: Not on file  . Frequency of Social Gatherings with Friends and Family: Not  on file  . Attends Religious Services: Not on file  . Active Member of Clubs or Organizations: Not on file  . Attends Archivist Meetings: Not on file  . Marital Status: Not on file   Additional Social History:                         Sleep: Good  Appetite:  Good  Current Medications: Current Facility-Administered Medications  Medication Dose Route Frequency Provider Last Rate Last Admin  . acetaminophen (TYLENOL) tablet 650 mg  650 mg Oral Q6H PRN [name], MD   650 mg at 07/28/19 1354  . alum & mag hydroxide-simeth (MAALOX/MYLANTA) 200-200-20 MG/5ML suspension 30 mL  30 mL Oral Q4H PRN [name], MD      . benztropine (COGENTIN) tablet 2 mg  2 mg Oral BID PRN [name], MD   2 mg at 07/25/19 1832   Or  . benztropine mesylate (COGENTIN) injection 2 mg  2 mg Intramuscular BID PRN [name], MD      . diphenhydrAMINE (BENADRYL) capsule 50 mg  50 mg Oral Q6H PRN [name], MD   50 mg at 07/26/19 1152   Or  . diphenhydrAMINE (BENADRYL) injection 50 mg  50 mg Intramuscular Q6H PRN [name], MD   50 mg at 07/25/19 1832  . FLUoxetine (PROZAC) capsule 40 mg  40 mg Oral Daily [name], MD   40 mg at 07/29/19 0816  . haloperidol (HALDOL) tablet 10 mg  10 mg Oral Q6H PRN Sharma Covert, MD   10 mg at 07/26/19 1152   Or  . haloperidol lactate (HALDOL) injection 10 mg  10 mg Intramuscular Q6H PRN Sharma Covert, MD   10 mg at 07/23/19 2052  . ibuprofen (ADVIL) tablet 600 mg  600 mg Oral Q6H PRN [name], [name], MD   600 mg at 07/29/19 0650  . magnesium hydroxide (MILK OF MAGNESIA) suspension 30 mL  30 mL Oral Daily PRN [name], MD      . traZODone (DESYREL) tablet 50 mg  50 mg Oral QHS PRN [name], MD   50 mg at 07/28/19 2215  . ziprasidone (GEODON) capsule 60 mg  60 mg Oral Q breakfast Sharma Covert, MD   60 mg at 07/29/19 0817  . ziprasidone (GEODON) capsule 80 mg  80 mg Oral Q supper Sharma Covert, MD   80 mg at 07/28/19 1721  . ziprasidone (GEODON) injection 20 mg  20 mg Intramuscular Q6H PRN Sharma Covert, MD  Lab Results: No results found for this or any previous visit (from the past 48 hour(s)).  Blood Alcohol level:  Lab Results  Component Value Date   ETH <10 07/09/2019   ETH <10 [id number]    Metabolic Disorder Labs: Lab Results  Component Value Date   HGBA1C 5.2 05/17/2019   MPG 102.54 05/17/2019   MPG 108.28 02/17/2019   Lab Results  Component Value Date   PROLACTIN 31.9 (H) 02/17/2019   PROLACTIN 29.6 (H) 09/25/2016   Lab Results  Component Value Date   CHOL 144 05/17/2019   TRIG 65 05/17/2019   HDL 35 (L) 05/17/2019   CHOLHDL 4.1 05/17/2019   VLDL 13 05/17/2019   LDLCALC 96 05/17/2019   LDLCALC 76 02/17/2019    Physical Findings: AIMS:  , ,  ,  ,    CIWA:    COWS:     Musculoskeletal: Strength & Muscle Tone: within normal limits Gait & Station: normal Patient leans: N/A  Psychiatric Specialty Exam: Physical Exam  Nursing note and vitals reviewed. Constitutional: She is oriented to person, place, and time. She appears well-developed and well-nourished.  Cardiovascular: Normal rate.  Respiratory: Effort normal.  Musculoskeletal:        General: Normal range of motion.  Neurological: She is alert and oriented to person, place, and time.  Skin: Skin is warm.    Review of Systems  Constitutional: Negative.   HENT: Negative.   Eyes: Negative.   Respiratory: Negative.   Cardiovascular: Negative.   Gastrointestinal: Negative.   Genitourinary: Negative.   Musculoskeletal: Negative.   Skin: Negative.   Neurological: Negative.   Psychiatric/Behavioral: Negative.     Blood pressure 105/72, pulse 100, temperature 98.5 F (36.9 C), temperature source Oral, resp. rate 17, height 5\' 4"  (1.626 m), weight 67.2 kg, SpO2 100 %.Body mass index is 25.43 kg/m.  General Appearance: Casual  Eye Contact:  Fair  Speech:  Slow   Volume:  Decreased  Mood:  Dysphoric  Affect:  Constricted  Thought Process:  Coherent  Orientation:  Full (Time, Place, and Person)  Thought Content:  Illogical and Tangential  Suicidal Thoughts:  No  Homicidal Thoughts:  No  Memory:  Immediate;   Fair Recent;   Fair Remote;   Fair  Judgement:  Impaired  Insight:  Lacking  Psychomotor Activity:  Normal  Concentration:  Concentration: Poor  Recall:  [employer] of Knowledge:  Fair  Language:  Fair  Akathisia:  No  Handed:  Right  AIMS (if indicated):     Assets:  Desire for Improvement Physical Health  ADL's:  Intact  Cognition:  Impaired,  Mild  Sleep:  Number of Hours: 7.75   Assessment: Patient presents in her room and is pleasant, calm, and cooperative during the evaluation.  Patient continues to present with constricted affect and slow speech.  Patient is denying all symptoms at this time.  Patient is reporting that she is spoken to her boyfriend, Darnelle Maffucci, yesterday afternoon and that she can go there and stay with him.  Patient was unable to contact him again today.  CSW is continually is attempting to assist patient with outpatient services through [employer] as well as assisting with starting the process for Medicaid or disability.  Patient is showing some improvement but continues to have some illogical ideas and continues to refer back to ideas that have failed in the past even though they were recent.  Patient does seem to wax and wane with her symptoms but  does show some improvement since admission.  Nursing staff did report that the patient again attempted to refuse her Geodon yesterday but then agreed to take the medication on her own without encouragement.  One-to-one was also contacted for the patient's possible left foot fracture.  Ortho had spoken to Dr. Weber Cooks and they recommended that the patient wear hard soled shoes, however the patient only has 10 shoes in her belongings.  Patient was provided with her to initiate if to  assist with support of her foot.  Treatment Plan Summary: Daily contact with patient to assess and evaluate symptoms and progress in treatment and Medication management Continue Prozac 40 mg p.o. daily for depression Continue agitation protocol of Haldol p.o. or IM, Benadryl p.o. or IM, and Cogentin p.o. or IM. Continue trazodone 50 mg p.o. nightly as needed for insomnia Continue Geodon 60 mg p.o. daily with breakfast and 80 mg p.o. daily with supper for psychosis Encourage group therapy participation Continue every 15 minute safety checks CSW has continued working with patient to assist with housing, outpatient treatment, and insurance coverage  Lewis Shock, FNP 07/29/2019, 2:55 PM

## 2019-07-29 NOTE — Progress Notes (Signed)
Patient was in her room upon arrival to the unit. Patient more pleasant this week compared to when this writer had her last week.Patient denies SI/HI/AVH, anxiety and pain. Patient requested PRN sleep medication and pain medication for her foot.Patient being monitored Q 15 minutes for safety per unit protocol. Patient remains safe on the unit.Patient observed interacting appropriately with staff and peers on the unit.

## 2019-07-29 NOTE — Progress Notes (Signed)
Recreation Therapy Notes    Date: 07/29/2019  Time: 9:30 am   Location: Craft room   Behavioral response: N/A   Intervention Topic: Goals   Discussion/Intervention: Patient did not attend group.   Clinical Observations/Feedback:  Patient did not attend group.   Muna Demers LRT/CTRS         Lamika Connolly 07/29/2019 11:42 AM 

## 2019-07-30 NOTE — [facility] Group Notes (Signed)
Feelings Around Relapse 07/30/2019 1PM  Type of Therapy and Topic:  Group Therapy:  Feelings around Relapse and Recovery  Participation Level:  Minimal   Description of Group:    Patients in this group will discuss emotions they experience before and after a relapse. They will process how experiencing these feelings, or avoidance of experiencing them, relates to having a relapse. Facilitator will guide patients to explore emotions they have related to recovery. Patients will be encouraged to process which emotions are more powerful. They will be guided to discuss the emotional reaction significant others in their lives may have to patients' relapse or recovery. Patients will be assisted in exploring ways to respond to the emotions of others without this contributing to a relapse.  Therapeutic Goals: 1. Patient will identify two or more emotions that lead to a relapse for them 2. Patient will identify two emotions that result when they relapse 3. Patient will identify two emotions related to recovery 4. Patient will demonstrate ability to communicate their needs through discussion and/or role plays   Summary of Patient Progress: Minimal participation, no input provided. Pt completed assignment and then left group early, did not return.    Therapeutic Modalities:   Cognitive Behavioral Therapy Solution-Focused Therapy Assertiveness Training Relapse Prevention Therapy   Yvette Rack, LCSW 07/30/2019 2:15 PM

## 2019-07-30 NOTE — Plan of Care (Signed)
Pt rates depression 7/10 and denies anxiety, SI, HI and VH. Pt has AH. Pt was educated on care plan and verbalizes understanding Water engineer Problem: Education: Goal: Utilization of techniques to improve thought processes will improve Outcome: Progressing Goal: Knowledge of the prescribed therapeutic regimen will improve Outcome: Progressing   Problem: Activity: Goal: Interest or engagement in leisure activities will improve Outcome: Progressing Goal: Imbalance in normal sleep/wake cycle will improve Outcome: Progressing   Problem: Coping: Goal: Coping ability will improve Outcome: Progressing Goal: Will verbalize feelings Outcome: Progressing   Problem: Health Behavior/Discharge Planning: Goal: Ability to make decisions will improve Outcome: Progressing Goal: Compliance with therapeutic regimen will improve Outcome: Progressing   Problem: Role Relationship: Goal: Will demonstrate positive changes in social behaviors and relationships Outcome: Progressing   Problem: Safety: Goal: Ability to disclose and discuss suicidal ideas will improve Outcome: Progressing Goal: Ability to identify and utilize support systems that promote safety will improve Outcome: Progressing   Problem: Self-Concept: Goal: Will verbalize positive feelings about self Outcome: Progressing Goal: Level of anxiety will decrease Outcome: Progressing   Problem: Education: Goal: Knowledge of the prescribed therapeutic regimen will improve Outcome: Progressing   Problem: Coping: Goal: Coping ability will improve Outcome: Progressing   Problem: Education: Goal: Knowledge of [facility] Education information/materials will improve Outcome: Progressing Goal: Emotional status will improve Outcome: Not Progressing

## 2019-07-30 NOTE — Progress Notes (Signed)
Patient has been in the milieu and currently not experiencing hallucinations. Received his Geodon at dinner time. No sign of distress. Safety monitored.

## 2019-07-30 NOTE — Progress Notes (Signed)
[facility] MD Progress Note  07/30/2019 2:00 PM Julia [name]  MRN:  [id number]   Subjective: Follow-up for the patient diagnosed with psychosis.  Patient states that she is doing good today.  Patient denies having any suicidal or homicidal ideations and denies any hallucinations.  Patient does report having concerns of been an investigation and that people are following her and when asked to she reports that it is [place], [place], and a man named [place].  She then starts typing on the table and stating that "you see this that stem tapping me that is how they know where I am and what they are making me do."  However patient does make statements and rationalizes that it is all in her head and that she realizes that this may not be true.  The patient does report that she has been eating well and sleeping well.  Principal Problem: Psychosis ([place]) Diagnosis: Principal Problem:   Psychosis ([place]) Active Problems:   Polysubstance abuse ([place])   Depressed mood   Substance induced mood disorder ([name])  Total Time spent with patient: 20 minutes  Past Psychiatric History: Patient has a history of several prior admissions.  Her situation appears to be complex and developing as she is only 19 years of age.  She appears to have some degree of intentionality to her behavior but also clearly seems to have some psychotic symptoms.  Has a history of suicide attempts.  Situation is very much complicated by her social problems  Past Medical History:  Past Medical History:  Diagnosis Date  . Burning with urination 05/03/2015  . Contraceptive management 05/03/2015  . Depression   . Dysmenorrhea 12/30/2013  . Heroin addiction ([place])   . Menorrhagia 12/30/2013  . Menstrual extraction 12/30/2013  . Migraines   . Social anxiety disorder 09/25/2016  . Suicidal ideations   . Vaginal odor 05/03/2015    Past Surgical History:  Procedure Laterality Date  . NO PAST SURGERIES     Family History:  Family History  Problem  Relation Age of Onset  . Depression Mother   . Hypertension Father   . Hyperlipidemia Father   . Cancer Paternal Grandmother        breast, uterine  . Cirrhosis Paternal Grandfather        due to alcohol   Family Psychiatric  History: See previous Social History:  Social History   Substance and Sexual Activity  Alcohol Use Yes   Comment: BAC was clear     Social History   Substance and Sexual Activity  Drug Use Yes  . Types: Marijuana, Oxycodone   Comment: reports oxycodone and marijuana    Social History   Socioeconomic History  . Marital status: Single    Spouse name: Not on file  . Number of children: Not on file  . Years of education: Not on file  . Highest education level: Not on file  Occupational History  . Occupation: Unemployed  Tobacco Use  . Smoking status: Current Every Day Smoker    Packs/day: 0.50    Types: Cigarettes  . Smokeless tobacco: Never Used  . Tobacco comment: Not ready to quit  Substance and Sexual Activity  . Alcohol use: Yes    Comment: BAC was clear  . Drug use: Yes    Types: Marijuana, Oxycodone    Comment: reports oxycodone and marijuana  . Sexual activity: Yes    Birth control/protection: Implant  Other Topics Concern  . Not on file  Social History  Narrative   06/23/2019:  Pt stated that she is homeless, that she is a high school graduate, and that she is unemployed and not followed by any outpatient provider.      Lives with Dad. Mom has LGD, lives in [place]. 11th grader. Dog.   Social Determinants of Health   Financial Resource Strain:   . Difficulty of Paying Living Expenses: Not on file  Food Insecurity:   . Worried About Charity fundraiser in the Last Year: Not on file  . Ran Out of Food in the Last Year: Not on file  Transportation Needs:   . Lack of Transportation (Medical): Not on file  . Lack of Transportation (Non-Medical): Not on file  Physical Activity:   . Days of Exercise per Week: Not on file  . Minutes of  Exercise per Session: Not on file  Stress:   . Feeling of Stress : Not on file  Social Connections:   . Frequency of Communication with Friends and Family: Not on file  . Frequency of Social Gatherings with Friends and Family: Not on file  . Attends Religious Services: Not on file  . Active Member of Clubs or Organizations: Not on file  . Attends Archivist Meetings: Not on file  . Marital Status: Not on file   Additional Social History:                         Sleep: Good  Appetite:  Good  Current Medications: Current Facility-Administered Medications  Medication Dose Route Frequency Provider Last Rate Last Admin  . acetaminophen (TYLENOL) tablet 650 mg  650 mg Oral Q6H PRN [name], MD   650 mg at 07/29/19 1732  . alum & mag hydroxide-simeth (MAALOX/MYLANTA) 200-200-20 MG/5ML suspension 30 mL  30 mL Oral Q4H PRN [name], MD      . benztropine (COGENTIN) tablet 2 mg  2 mg Oral BID PRN [name], MD   2 mg at 07/25/19 1832   Or  . benztropine mesylate (COGENTIN) injection 2 mg  2 mg Intramuscular BID PRN [name], MD      . diphenhydrAMINE (BENADRYL) capsule 50 mg  50 mg Oral Q6H PRN [name], MD   50 mg at 07/26/19 1152   Or  . diphenhydrAMINE (BENADRYL) injection 50 mg  50 mg Intramuscular Q6H PRN [name], MD   50 mg at 07/25/19 1832  . FLUoxetine (PROZAC) capsule 40 mg  40 mg Oral Daily [name], MD   40 mg at 07/30/19 [id number]  . haloperidol (HALDOL) tablet 10 mg  10 mg Oral Q6H PRN Sharma Covert, MD   10 mg at 07/29/19 1538   Or  . haloperidol lactate (HALDOL) injection 10 mg  10 mg Intramuscular Q6H PRN Sharma Covert, MD   10 mg at 07/23/19 2052  . ibuprofen (ADVIL) tablet 600 mg  600 mg Oral Q6H PRN [name], [name], MD   600 mg at 07/29/19 2117  . magnesium hydroxide (MILK OF MAGNESIA) suspension 30 mL  30 mL Oral Daily PRN [name], MD      . traZODone (DESYREL) tablet 50 mg  50 mg  Oral QHS PRN [name], MD   50 mg at 07/29/19 2117  . ziprasidone (GEODON) capsule 60 mg  60 mg Oral Q breakfast Sharma Covert, MD   60 mg at 07/30/19 0917  .  ziprasidone (GEODON) capsule 80 mg  80 mg Oral Q supper Sharma Covert, MD   80 mg at 07/29/19 1732  . ziprasidone (GEODON) injection 20 mg  20 mg Intramuscular Q6H PRN Sharma Covert, MD        Lab Results: No results found for this or any previous visit (from the past 48 hour(s)).  Blood Alcohol level:  Lab Results  Component Value Date   ETH <10 07/09/2019   ETH <10 [id number]    Metabolic Disorder Labs: Lab Results  Component Value Date   HGBA1C 5.2 05/17/2019   MPG 102.54 05/17/2019   MPG 108.28 02/17/2019   Lab Results  Component Value Date   PROLACTIN 31.9 (H) 02/17/2019   PROLACTIN 29.6 (H) 09/25/2016   Lab Results  Component Value Date   CHOL 144 05/17/2019   TRIG 65 05/17/2019   HDL 35 (L) 05/17/2019   CHOLHDL 4.1 05/17/2019   VLDL 13 05/17/2019   LDLCALC 96 05/17/2019   LDLCALC 76 02/17/2019    Physical Findings: AIMS:  , ,  ,  ,    CIWA:    COWS:     Musculoskeletal: Strength & Muscle Tone: within normal limits Gait & Station: normal Patient leans: N/A  Psychiatric Specialty Exam: Physical Exam  Nursing note and vitals reviewed. Constitutional: She is oriented to person, place, and time. She appears well-developed and well-nourished.  Cardiovascular: Normal rate.  Respiratory: Effort normal.  Musculoskeletal:        General: Normal range of motion.  Neurological: She is alert and oriented to person, place, and time.  Skin: Skin is warm.    Review of Systems  Constitutional: Negative.   HENT: Negative.   Eyes: Negative.   Respiratory: Negative.   Cardiovascular: Negative.   Gastrointestinal: Negative.   Genitourinary: Negative.   Musculoskeletal: Negative.   Skin: Negative.   Neurological: Negative.   Psychiatric/Behavioral: Negative.     Blood pressure  105/72, pulse 100, temperature 98.5 F (36.9 C), temperature source Oral, resp. rate 17, height 5\' 4"  (1.626 m), weight 67.2 kg, SpO2 100 %.Body mass index is 25.43 kg/m.  General Appearance: Casual  Eye Contact:  Fair  Speech:  Slow  Volume:  Decreased  Mood:  Euthymic  Affect:  Constricted  Thought Process:  Coherent  Orientation:  Full (Time, Place, and Person)  Thought Content:  Illogical and but redirectable easily  Suicidal Thoughts:  No  Homicidal Thoughts:  No  Memory:  Immediate;   Fair Recent;   Fair Remote;   Fair  Judgement:  Impaired  Insight:  Lacking  Psychomotor Activity:  Normal  Concentration:  Concentration: Poor  Recall:  [employer] of Knowledge:  Fair  Language:  Fair  Akathisia:  No  Handed:  Right  AIMS (if indicated):     Assets:  Desire for Improvement Physical Health  ADL's:  Intact  Cognition:  Impaired,  Mild  Sleep:  Number of Hours: 8.5   Assessment: Patient presents sitting in the day room filling out her menu.  Patient is pleasant, calm, cooperative during the evaluation with me.  Patient does make some rational comments and thoughts about being in an investigation and makes some bizarre behavior such as tapping on the table.  And then stating that she is being kept and not telling her where she is.  However, it appears that the patient is forcing this behavior.  Patient was unable to continue tapping on the table and speak to me  at the same time.  Patient had to stop tapping to answer questions and then would start happening again as if she could not focus on both things at the same time.  At the end of the evaluation the patient was answering questions appropriately but stating that she was feeling able to participate in the program but does nest and that she would be compliant with medications and treatments.  There is a small possibility that the patient may be forcing some of her behavior.  Would not make any medication changes at this  time.  Treatment Plan Summary: Daily contact with patient to assess and evaluate symptoms and progress in treatment and Medication management Continue Prozac 40 mg p.o. daily for depression Continue agitation protocol of Haldol p.o. or IM, Benadryl p.o. or IM, and Cogentin p.o. or IM. Continue trazodone 50 mg p.o. nightly as needed for insomnia Continue Geodon 60 mg p.o. daily with breakfast and 80 mg p.o. daily with supper for psychosis Encourage group therapy participation Continue every 15 minute safety checks CSW has continued working with patient to assist with housing, outpatient treatment, and insurance coverage  Lewis Shock, FNP 07/30/2019, 2:00 PM

## 2019-07-30 NOTE — Progress Notes (Signed)
Recreation Therapy Notes   Date: 07/30/2019  Time: 9:30 am   Location: Craft room   Behavioral response: N/A   Intervention Topic: Strengths   Discussion/Intervention: Patient did not attend group.   Clinical Observations/Feedback:  Patient did not attend group.   Anacleto Batterman LRT/CTRS        Shawnn Bouillon 07/30/2019 10:45 AM

## 2019-07-31 MED ORDER — PALIPERIDONE ER 3 MG PO TB24
9.0000 mg | ORAL_TABLET | Freq: Every day | ORAL | Status: DC
  Administered 2019-07-31 – 2019-08-16 (×14): 9 mg via ORAL
  Filled 2019-07-31 (×11): qty 3

## 2019-07-31 NOTE — [facility] Group Notes (Signed)
[facility] LCSW Group Therapy Note  Date/Time: 07/31/2019 @ 1:30pm  Type of Therapy/Topic:  Group Therapy:  Feelings about Diagnosis  Participation Level:  Did Not Attend   Mood: Did not attend    Description of Group:    This group will allow patients to explore their thoughts and feelings about diagnoses they have received. Patients will be guided to explore their level of understanding and acceptance of these diagnoses. Facilitator will encourage patients to process their thoughts and feelings about the reactions of others to their diagnosis, and will guide patients in identifying ways to discuss their diagnosis with significant others in their lives. This group will be process-oriented, with patients participating in exploration of their own experiences as well as giving and receiving support and challenge from other group members.   Therapeutic Goals: 1. Patient will demonstrate understanding of diagnosis as evidence by identifying two or more symptoms of the disorder:  2. Patient will be able to express two feelings regarding the diagnosis 3. Patient will demonstrate ability to communicate their needs through discussion and/or role plays  Summary of Patient Progress:    Patient did not attend group therapy today.     Therapeutic Modalities:   Cognitive Behavioral Therapy Brief Therapy Feelings Identification   Ardelle Anton, LCSW

## 2019-07-31 NOTE — Plan of Care (Addendum)
Aware of  medication received. Patient aware of [place] education , able to verbalize understanding  with redirection from staff. Emotional and mental  health status  improving . Able to maintain frustration  no anger management  concerns. V oice of no safety concerns . Working on Scientific laboratory technician.  Problem: Health Behavior/Discharge Planning: Goal: Ability to make decisions will improve Outcome: Progressing Goal: Compliance with therapeutic regimen will improve Outcome: Progressing   Problem: Coping: Goal: Coping ability will improve Outcome: Progressing Goal: Will verbalize feelings Outcome: Progressing   Problem: Activity: Goal: Interest or engagement in leisure activities will improve Outcome: Progressing Goal: Imbalance in normal sleep/wake cycle will improve Outcome: Progressing   Problem: Education: Goal: Utilization of techniques to improve thought processes will improve Outcome: Progressing Goal: Knowledge of the prescribed therapeutic regimen will improve Outcome: Progressing   Problem: Role Relationship: Goal: Will demonstrate positive changes in social behaviors and relationships Outcome: Progressing   Problem: Self-Concept: Goal: Will verbalize positive feelings about self Outcome: Progressing Goal: Level of anxiety will decrease Outcome: Progressing   Problem: Education: Goal: Knowledge of the prescribed therapeutic regimen will improve Outcome: Progressing   Problem: Education: Goal: Knowledge of [facility] Education information/materials will improve Outcome: Progressing Goal: Emotional status will improve Outcome: Progressing  oice of no safety concerns . Working on Scientific laboratory technician.

## 2019-07-31 NOTE — Progress Notes (Signed)
[facility] MD Progress Note  07/31/2019 2:54 PM Julia [name]  MRN:  [id number] Subjective: Follow-up for this young woman with multiple symptoms today she continues to report having hallucinations paranoia and believes that she is somehow involved in some kind of sinister conspiracy involving the government.  Patient's hygiene is adequate affect however is flat.  She has not been acting out today at least.  She continues to complain that she thinks the Geodon is making her vision blurry and wants to discontinue it.  Patient came to me later in the afternoon saying she thought she had a place to stay although it is not clear that there was any reality to this. Principal Problem: Psychosis ([place]) Diagnosis: Principal Problem:   Psychosis ([place]) Active Problems:   Polysubstance abuse ([place])   Depressed mood   Substance induced mood disorder ([place])  Total Time spent with patient: 30 minutes  Past Psychiatric History: Patient has a history of escalating psychotic and behavior problems and substance abuse issues  Past Medical History:  Past Medical History:  Diagnosis Date  . Burning with urination 05/03/2015  . Contraceptive management 05/03/2015  . Depression   . Dysmenorrhea 12/30/2013  . Heroin addiction ([place])   . Menorrhagia 12/30/2013  . Menstrual extraction 12/30/2013  . Migraines   . Social anxiety disorder 09/25/2016  . Suicidal ideations   . Vaginal odor 05/03/2015    Past Surgical History:  Procedure Laterality Date  . NO PAST SURGERIES     Family History:  Family History  Problem Relation Age of Onset  . Depression Mother   . Hypertension Father   . Hyperlipidemia Father   . Cancer Paternal Grandmother        breast, uterine  . Cirrhosis Paternal Grandfather        due to alcohol   Family Psychiatric  History: See previous Social History:  Social History   Substance and Sexual Activity  Alcohol Use Yes   Comment: BAC was clear     Social History   Substance and Sexual  Activity  Drug Use Yes  . Types: Marijuana, Oxycodone   Comment: reports oxycodone and marijuana    Social History   Socioeconomic History  . Marital status: Single    Spouse name: Not on file  . Number of children: Not on file  . Years of education: Not on file  . Highest education level: Not on file  Occupational History  . Occupation: Unemployed  Tobacco Use  . Smoking status: Current Every Day Smoker    Packs/day: 0.50    Types: Cigarettes  . Smokeless tobacco: Never Used  . Tobacco comment: Not ready to quit  Substance and Sexual Activity  . Alcohol use: Yes    Comment: BAC was clear  . Drug use: Yes    Types: Marijuana, Oxycodone    Comment: reports oxycodone and marijuana  . Sexual activity: Yes    Birth control/protection: Implant  Other Topics Concern  . Not on file  Social History Narrative   06/23/2019:  Pt stated that she is homeless, that she is a high school graduate, and that she is unemployed and not followed by any outpatient provider.      Lives with Dad. Mom has LGD, lives in [place]. 11th grader. Dog.   Social Determinants of Health   Financial Resource Strain:   . Difficulty of Paying Living Expenses: Not on file  Food Insecurity:   . Worried About Charity fundraiser in the Last  Year: Not on file  . Ran Out of Food in the Last Year: Not on file  Transportation Needs:   . Lack of Transportation (Medical): Not on file  . Lack of Transportation (Non-Medical): Not on file  Physical Activity:   . Days of Exercise per Week: Not on file  . Minutes of Exercise per Session: Not on file  Stress:   . Feeling of Stress : Not on file  Social Connections:   . Frequency of Communication with Friends and Family: Not on file  . Frequency of Social Gatherings with Friends and Family: Not on file  . Attends Religious Services: Not on file  . Active Member of Clubs or Organizations: Not on file  . Attends Archivist Meetings: Not on file  . Marital  Status: Not on file   Additional Social History:                         Sleep: Fair  Appetite:  Fair  Current Medications: Current Facility-Administered Medications  Medication Dose Route Frequency Provider Last Rate Last Admin  . acetaminophen (TYLENOL) tablet 650 mg  650 mg Oral Q6H PRN [name], MD   650 mg at 07/29/19 1732  . alum & mag hydroxide-simeth (MAALOX/MYLANTA) 200-200-20 MG/5ML suspension 30 mL  30 mL Oral Q4H PRN [name], MD      . benztropine (COGENTIN) tablet 2 mg  2 mg Oral BID PRN [name], MD   2 mg at 07/25/19 1832   Or  . benztropine mesylate (COGENTIN) injection 2 mg  2 mg Intramuscular BID PRN [name], MD      . diphenhydrAMINE (BENADRYL) capsule 50 mg  50 mg Oral Q6H PRN [name], MD   50 mg at 07/26/19 1152   Or  . diphenhydrAMINE (BENADRYL) injection 50 mg  50 mg Intramuscular Q6H PRN [name], MD   50 mg at 07/25/19 1832  . FLUoxetine (PROZAC) capsule 40 mg  40 mg Oral Daily [name], MD   40 mg at 07/31/19 [id number]  . haloperidol (HALDOL) tablet 10 mg  10 mg Oral Q6H PRN Sharma Covert, MD   10 mg at 07/29/19 1538   Or  . haloperidol lactate (HALDOL) injection 10 mg  10 mg Intramuscular Q6H PRN Sharma Covert, MD   10 mg at 07/23/19 2052  . ibuprofen (ADVIL) tablet 600 mg  600 mg Oral Q6H PRN Darral Rishel, [name], MD   600 mg at 07/30/19 2110  . magnesium hydroxide (MILK OF MAGNESIA) suspension 30 mL  30 mL Oral Daily PRN [name], MD      . paliperidone (INVEGA) 24 hr tablet 9 mg  9 mg Oral QHS Tylan Briguglio T, MD      . traZODone (DESYREL) tablet 50 mg  50 mg Oral QHS PRN [name], MD   50 mg at 07/30/19 2110  . ziprasidone (GEODON) injection 20 mg  20 mg Intramuscular Q6H PRN Sharma Covert, MD        Lab Results: No results found for this or any previous visit (from the past 48 hour(s)).  Blood Alcohol level:  Lab Results  Component Value Date   [facility] <10  07/09/2019   ETH <10 [id number]    Metabolic Disorder Labs: Lab Results  Component Value Date   HGBA1C 5.2 05/17/2019   MPG 102.54 05/17/2019   MPG 108.28 02/17/2019  Lab Results  Component Value Date   PROLACTIN 31.9 (H) 02/17/2019   PROLACTIN 29.6 (H) 09/25/2016   Lab Results  Component Value Date   CHOL 144 05/17/2019   TRIG 65 05/17/2019   HDL 35 (L) 05/17/2019   CHOLHDL 4.1 05/17/2019   VLDL 13 05/17/2019   LDLCALC 96 05/17/2019   LDLCALC 76 02/17/2019    Physical Findings: AIMS:  , ,  ,  ,    CIWA:    COWS:     Musculoskeletal: Strength & Muscle Tone: within normal limits Gait & Station: normal Patient leans: N/A  Psychiatric Specialty Exam: Physical Exam  Nursing note and vitals reviewed. Constitutional: She appears well-developed and well-nourished.  HENT:  Head: Normocephalic and atraumatic.  Eyes: Pupils are equal, round, and reactive to light. Conjunctivae are normal.  Cardiovascular: Regular rhythm and normal heart sounds.  Respiratory: Effort normal. No respiratory distress.  GI: Soft.  Musculoskeletal:        General: Normal range of motion.     Cervical back: Normal range of motion.  Neurological: She is alert.  Skin: Skin is warm and dry.  Psychiatric: Her mood appears anxious. She is actively hallucinating.    Review of Systems  Constitutional: Negative.   HENT: Negative.   Eyes: Negative.   Respiratory: Negative.   Cardiovascular: Negative.   Gastrointestinal: Negative.   Musculoskeletal: Negative.   Skin: Negative.   Neurological: Negative.   Psychiatric/Behavioral: Positive for confusion, dysphoric mood and hallucinations. The patient is nervous/anxious.     Blood pressure 113/73, pulse (!) 114, temperature 98.5 F (36.9 C), temperature source Oral, resp. rate 18, height 5\' 4"  (1.626 m), weight 67.2 kg, SpO2 99 %.Body mass index is 25.43 kg/m.  General Appearance: Casual  Eye Contact:  Fair  Speech:  Slow  Volume:  Decreased   Mood:  Depressed and Dysphoric  Affect:  Constricted  Thought Process:  Disorganized  Orientation:  Full (Time, Place, and Person)  Thought Content:  Illogical, Delusions and Hallucinations: Auditory  Suicidal Thoughts:  No  Homicidal Thoughts:  No  Memory:  Immediate;   Fair Recent;   Fair Remote;   Fair  Judgement:  Poor  Insight:  Shallow  Psychomotor Activity:  Decreased  Concentration:  Concentration: Poor  Recall:  Poor  Fund of Knowledge:  Poor  Language:  Fair  Akathisia:  No  Handed:  Right  AIMS (if indicated):     Assets:  Desire for Improvement Physical Health  ADL's:  Impaired  Cognition:  Impaired,  Mild  Sleep:  Number of Hours: 6.75     Treatment Plan Summary: Daily contact with patient to assess and evaluate symptoms and progress in treatment, Medication management and Plan Since she was requesting to stop the Geodon we sat down and reviewed her medication.  He has been on multiple antipsychotic since coming into the hospital.  We had made to get her stable on long-acting Invega around the end of the year but over the weekend there was an episode when she was given Zyprexa for agitation and the Invega was discontinued.  Looking back on that she has gotten both of the Invega long-acting injection so it seems more reasonable to try seeing if that medicine alone might really help this time.  I am going to stop the Geodon at her request and start back on Invega 9 mg at night continuing current medicine.  Alethia Berthold, MD 07/31/2019, 2:54 PM

## 2019-07-31 NOTE — Plan of Care (Signed)
Cooperative with treatment, compliant with medication regime, no changes to report on shift at this time.

## 2019-07-31 NOTE — Tx Team (Signed)
Interdisciplinary Treatment and Diagnostic Plan Update  07/31/2019 Time of Session: 10:35am Julia [name] MRN: [id number]  Principal Diagnosis: Psychosis [facility])  Secondary Diagnoses: Principal Problem:   Psychosis ([place]) Active Problems:   Polysubstance abuse ([place])   Depressed mood   Substance induced mood disorder ([place])   Current Medications:  Current Facility-Administered Medications  Medication Dose Route Frequency Provider Last Rate Last Admin  . acetaminophen (TYLENOL) tablet 650 mg  650 mg Oral Q6H PRN [name], MD   650 mg at 07/29/19 1732  . alum & mag hydroxide-simeth (MAALOX/MYLANTA) 200-200-20 MG/5ML suspension 30 mL  30 mL Oral Q4H PRN [name], MD      . benztropine (COGENTIN) tablet 2 mg  2 mg Oral BID PRN [name], MD   2 mg at 07/25/19 1832   Or  . benztropine mesylate (COGENTIN) injection 2 mg  2 mg Intramuscular BID PRN [name], MD      . diphenhydrAMINE (BENADRYL) capsule 50 mg  50 mg Oral Q6H PRN [name], MD   50 mg at 07/26/19 1152   Or  . diphenhydrAMINE (BENADRYL) injection 50 mg  50 mg Intramuscular Q6H PRN [name], MD   50 mg at 07/25/19 1832  . FLUoxetine (PROZAC) capsule 40 mg  40 mg Oral Daily [name], MD   40 mg at 07/31/19 [id number]  . haloperidol (HALDOL) tablet 10 mg  10 mg Oral Q6H PRN Sharma Covert, MD   10 mg at 07/29/19 1538   Or  . haloperidol lactate (HALDOL) injection 10 mg  10 mg Intramuscular Q6H PRN Sharma Covert, MD   10 mg at 07/23/19 2052  . ibuprofen (ADVIL) tablet 600 mg  600 mg Oral Q6H PRN [name], [name], MD   600 mg at 07/30/19 2110  . magnesium hydroxide (MILK OF MAGNESIA) suspension 30 mL  30 mL Oral Daily PRN [name], MD      . paliperidone (INVEGA) 24 hr tablet 9 mg  9 mg Oral QHS [name], John T, MD      . traZODone (DESYREL) tablet 50 mg  50 mg Oral QHS PRN [name], MD   50 mg at 07/30/19 2110  . ziprasidone (GEODON) injection 20 mg  20 mg  Intramuscular Q6H PRN Sharma Covert, MD       PTA Medications: Medications Prior to Admission  Medication Sig Dispense Refill Last Dose  . etonogestrel (NEXPLANON) 68 MG IMPL implant 1 each (68 mg total) by Subdermal route once for 1 dose. (Birth control method) 1 each 0   . FLUoxetine (PROZAC) 10 MG capsule Take 3 capsules (30 mg total) by mouth daily. 90 capsule 1   . hydrOXYzine (ATARAX/VISTARIL) 25 MG tablet Take 1 tablet (25 mg total) by mouth every 6 (six) hours as needed for anxiety. 60 tablet 1   . QUEtiapine (SEROQUEL) 50 MG tablet Take 1 tablet (50 mg total) by mouth daily. 30 tablet 1   . traZODone (DESYREL) 50 MG tablet Take 50 mg by mouth at bedtime as needed. for sleep       Patient Stressors: Financial difficulties Loss of Mother Marital or family conflict Substance abuse  Patient Strengths: Ability for insight Communication skills  Treatment Modalities: Medication Management, Group therapy, Case management,  1 to 1 session with clinician, Psychoeducation, Recreational therapy.   Physician Treatment Plan for Primary Diagnosis: Psychosis [facility]) Long Term Goal(s): Improvement in symptoms so as ready  for discharge Improvement in symptoms so as ready for discharge   Short Term Goals: Ability to verbalize feelings will improve Ability to disclose and discuss suicidal ideas Ability to demonstrate self-control will improve Ability to identify and develop effective coping behaviors will improve Compliance with prescribed medications will improve  Medication Management: Evaluate patient's response, side effects, and tolerance of medication regimen.  Therapeutic Interventions: 1 to 1 sessions, Unit Group sessions and Medication administration.  Evaluation of Outcomes: Progressing  Physician Treatment Plan for Secondary Diagnosis: Principal Problem:   Psychosis ([place]) Active Problems:   Polysubstance abuse ([place])   Depressed mood   Substance induced mood disorder  ([place])  Long Term Goal(s): Improvement in symptoms so as ready for discharge Improvement in symptoms so as ready for discharge   Short Term Goals: Ability to verbalize feelings will improve Ability to disclose and discuss suicidal ideas Ability to demonstrate self-control will improve Ability to identify and develop effective coping behaviors will improve Compliance with prescribed medications will improve     Medication Management: Evaluate patient's response, side effects, and tolerance of medication regimen.  Therapeutic Interventions: 1 to 1 sessions, Unit Group sessions and Medication administration.  Evaluation of Outcomes: Progressing   RN Treatment Plan for Primary Diagnosis: Psychosis ([place]) Long Term Goal(s): Knowledge of disease and therapeutic regimen to maintain health will improve  Short Term Goals: Ability to participate in decision making will improve, Ability to verbalize feelings will improve, Ability to disclose and discuss suicidal ideas, Ability to identify and develop effective coping behaviors will improve and Compliance with prescribed medications will improve  Medication Management: RN will administer medications as ordered by provider, will assess and evaluate patient's response and provide education to patient for prescribed medication. RN will report any adverse and/or side effects to prescribing provider.  Therapeutic Interventions: 1 on 1 counseling sessions, Psychoeducation, Medication administration, Evaluate responses to treatment, Monitor vital signs and CBGs as ordered, Perform/monitor CIWA, COWS, AIMS and Fall Risk screenings as ordered, Perform wound care treatments as ordered.  Evaluation of Outcomes: Progressing   LCSW Treatment Plan for Primary Diagnosis: Psychosis ([place]) Long Term Goal(s): Safe transition to appropriate next level of care at discharge, Engage patient in therapeutic group addressing interpersonal concerns.  Short Term Goals:  Engage patient in aftercare planning with referrals and resources and Increase skills for wellness and recovery  Therapeutic Interventions: Assess for all discharge needs, 1 to 1 time with Social worker, Explore available resources and support systems, Assess for adequacy in community support network, Educate family and significant other(s) on suicide prevention, Complete Psychosocial Assessment, Interpersonal group therapy.  Evaluation of Outcomes: Progressing   Progress in Treatment: Attending groups: No. Participating in groups: No. Taking medication as prescribed: Yes. Toleration medication: Yes. Family/Significant other contact made: No, will contact:  pt declined spe; with pt Patient understands diagnosis: Yes. Discussing patient identified problems/goals with staff: Yes. Medical problems stabilized or resolved: Yes. Denies suicidal/homicidal ideation: Yes. Issues/concerns per patient self-inventory: No. Other:   New problem(s) identified: No, Describe:  None  New Short Term/Long Term Goal(s): Medication stabilization, elimination of SI thoughts, and development of a comprehensive mental wellness plan.   Patient Goals:    "to have a plan"  Update 07/27/2019: No changes at this time. Update 07/31/2019: No changes at this time   Discharge Plan or Barriers: Patient is currently homeless.  Patient aftercare depends on where patient will be staying. CSW is attempting to identify an available bed at local homeless shelters.  Patient  reports that she can not return to [facility]. Update 07/22/19-Pt displaying psychotic behavior. No other social supports identified. CSW's attempted to contact pt boyfriend and father. Awaiting response from boyfriend and pt father lives in a nursing home.  Discharge plan TBD Update 07/27/2019:  Patient continues to adjust to her medications. She continues to have some delusions in regards of being a part of witness protection and having the police locate  housing for her. Patient is homeless with no supports from home to assist with housing.  Application for [employer] in [place] has been sent.  No word yet on possible housing.  Patient can not stay with father, return to [facility] and calls to her boyfriend have not been returned. Update 07/31/2019: CSW messaged Ana Leamon at [facility] about obtaining medicaid for the patient and helping her through the process of receiving medicaid so that patient can have more opportunities for aftercare and services.   Reason for Continuation of Hospitalization: Delusions  Medication stabilization  Estimated Length of Stay: 3-5 days   Attendees: Patient: 07/31/2019   Physician: Dr. Alethia Berthold, MD  07/31/2019   Nursing: Meredith Mody, RN 07/31/2019   RN Care Manager: 07/31/2019   Social Worker: Ardelle Anton, LCSW  07/31/2019   Recreational Therapist:  07/31/2019   Other:  07/31/2019   Other:  07/31/2019   Other: 07/31/2019      Scribe for Treatment Team: Trecia Rogers, LCSW 07/31/2019 1:20 PM

## 2019-07-31 NOTE — Progress Notes (Addendum)
D: Psychosis  Patient stated slept  Most of am  Up at lunch time  .Stated appetite is good Voice of no . Depression  , hopeless and anxiety . Denies suicidal  homicidal ideations  .  Denies  auditory hallucinations  No pain concerns . Appropriate ADL'S. Interacting with peers and staff.  A: Encourage patient participation with unit programming . Instruction  Given on  Medication , verbalize understanding.Aware of  medication received. Patient aware of [place] education , able to verbalize understanding  with redirection from staff. Emotional and mental  health status  improving . Able to maintain frustration  no anger management  concerns. V oice of no safety concerns . Working on Scientific laboratory technician. Affect cheerful on approach. Voice no concerns around placement issues   R: Voice no other concerns. Staff continue to monitor

## 2019-07-31 NOTE — Plan of Care (Signed)
Cooperative with treatment, patient reports issues with her medications and writer instructed her to follow-up with her doctor. She appears to be in bed resting quietly at this time.

## 2019-08-01 NOTE — BHH Group Notes (Signed)
[facility] Group Notes:  (Nursing/MHT/Case Management/Adjunct)  Date:  08/01/2019  Time:  5:07 PM  Type of Therapy:  Psychoeducational Skills  Participation Level:  Minimal  Participation Quality:  Inattentive  Affect:  Not Congruent  Cognitive:  Confused  Insight:  Limited  Engagement in Group:  Poor  Modes of Intervention:  Activity and Socialization  Summary of Progress/Problems:  Adela Lank [facility] 08/01/2019, 5:07 PM

## 2019-08-01 NOTE — Progress Notes (Signed)
Patient is asleep at the time of med pass. This writer will administer patient's scheduled medication once she wakes up. MD will be notified.

## 2019-08-01 NOTE — BHH Group Notes (Signed)
LCSW Aftercare Discharge Planning Group Note  08/01/2019  Type of Group and Topic: Psychoeducational Group: Discharge Planning  Participation Level: None  Description of Group  Discharge planning group reviews patient's anticipated discharge plans and assists patients to anticipate and address any barriers to wellness/recovery in the community. Suicide prevention education is reviewed with patients in group.  Therapeutic Goals  1. Patients will state their anticipated discharge plan and mental health aftercare  2. Patients will identify potential barriers to wellness in the community setting  3. Patients will engage in problem solving, solution focused discussion of ways to anticipate and address barriers to wellness/recovery  Summary of Patient Progress  Plan for Discharge/Comments: Jacci left as soon as the group topic was introduced and did not return.  Therapeutic Modalities:  [place], [place]  08/01/2019 1:59 PM

## 2019-08-01 NOTE — Progress Notes (Signed)
[facility] MD Progress Note  08/01/2019 1:31 PM Julia [name]  MRN:  [id number] Subjective: Patient seen and chart reviewed.  Follow-up with his 19 year old woman with psychotic disorder.  Patient has no new complaints today.  She denies suicidal thoughts.  Denies feeling as though anyone were out to get her or communicating with her.  Affect continues to look flat and blunted but not necessarily frightened.  Has not been agitated over the last day. Principal Problem: Psychosis ([place]) Diagnosis: Principal Problem:   Psychosis ([place]) Active Problems:   Polysubstance abuse ([place])   Depressed mood   Substance induced mood disorder ([place])  Total Time spent with patient: 30 minutes  Past Psychiatric History: Past history of recurrent hospitalizations and problems related to mental health and substance use  Past Medical History:  Past Medical History:  Diagnosis Date  . Burning with urination 05/03/2015  . Contraceptive management 05/03/2015  . Depression   . Dysmenorrhea 12/30/2013  . Heroin addiction ([place])   . Menorrhagia 12/30/2013  . Menstrual extraction 12/30/2013  . Migraines   . Social anxiety disorder 09/25/2016  . Suicidal ideations   . Vaginal odor 05/03/2015    Past Surgical History:  Procedure Laterality Date  . NO PAST SURGERIES     Family History:  Family History  Problem Relation Age of Onset  . Depression Mother   . Hypertension Father   . Hyperlipidemia Father   . Cancer Paternal Grandmother        breast, uterine  . Cirrhosis Paternal Grandfather        due to alcohol   Family Psychiatric  History: See previous Social History:  Social History   Substance and Sexual Activity  Alcohol Use Yes   Comment: BAC was clear     Social History   Substance and Sexual Activity  Drug Use Yes  . Types: Marijuana, Oxycodone   Comment: reports oxycodone and marijuana    Social History   Socioeconomic History  . Marital status: Single    Spouse name: Not on file  .  Number of children: Not on file  . Years of education: Not on file  . Highest education level: Not on file  Occupational History  . Occupation: Unemployed  Tobacco Use  . Smoking status: Current Every Day Smoker    Packs/day: 0.50    Types: Cigarettes  . Smokeless tobacco: Never Used  . Tobacco comment: Not ready to quit  Substance and Sexual Activity  . Alcohol use: Yes    Comment: BAC was clear  . Drug use: Yes    Types: Marijuana, Oxycodone    Comment: reports oxycodone and marijuana  . Sexual activity: Yes    Birth control/protection: Implant  Other Topics Concern  . Not on file  Social History Narrative   06/23/2019:  Pt stated that she is homeless, that she is a high school graduate, and that she is unemployed and not followed by any outpatient provider.      Lives with Dad. Mom has LGD, lives in [place]. 11th grader. Dog.   Social Determinants of Health   Financial Resource Strain:   . Difficulty of Paying Living Expenses: Not on file  Food Insecurity:   . Worried About Charity fundraiser in the Last Year: Not on file  . Ran Out of Food in the Last Year: Not on file  Transportation Needs:   . Lack of Transportation (Medical): Not on file  . Lack of Transportation (Non-Medical): Not on file  Physical Activity:   . Days of Exercise per Week: Not on file  . Minutes of Exercise per Session: Not on file  Stress:   . Feeling of Stress : Not on file  Social Connections:   . Frequency of Communication with Friends and Family: Not on file  . Frequency of Social Gatherings with Friends and Family: Not on file  . Attends Religious Services: Not on file  . Active Member of Clubs or Organizations: Not on file  . Attends Archivist Meetings: Not on file  . Marital Status: Not on file   Additional Social History:                         Sleep: Fair  Appetite:  Fair  Current Medications: Current Facility-Administered Medications  Medication Dose Route  Frequency Provider Last Rate Last Admin  . acetaminophen (TYLENOL) tablet 650 mg  650 mg Oral Q6H PRN [name], MD   650 mg at 07/29/19 1732  . alum & mag hydroxide-simeth (MAALOX/MYLANTA) 200-200-20 MG/5ML suspension 30 mL  30 mL Oral Q4H PRN [name], MD      . benztropine (COGENTIN) tablet 2 mg  2 mg Oral BID PRN [name], MD   2 mg at 07/25/19 1832   Or  . benztropine mesylate (COGENTIN) injection 2 mg  2 mg Intramuscular BID PRN [name], MD      . diphenhydrAMINE (BENADRYL) capsule 50 mg  50 mg Oral Q6H PRN [name], MD   50 mg at 07/26/19 1152   Or  . diphenhydrAMINE (BENADRYL) injection 50 mg  50 mg Intramuscular Q6H PRN [name], MD   50 mg at 07/25/19 1832  . FLUoxetine (PROZAC) capsule 40 mg  40 mg Oral Daily [name], MD   40 mg at 08/01/19 1120  . haloperidol (HALDOL) tablet 10 mg  10 mg Oral Q6H PRN Sharma Covert, MD   10 mg at 07/29/19 1538   Or  . haloperidol lactate (HALDOL) injection 10 mg  10 mg Intramuscular Q6H PRN Sharma Covert, MD   10 mg at 07/23/19 2052  . ibuprofen (ADVIL) tablet 600 mg  600 mg Oral Q6H PRN Breea Loncar T, MD   600 mg at 08/01/19 1120  . magnesium hydroxide (MILK OF MAGNESIA) suspension 30 mL  30 mL Oral Daily PRN [name], MD      . paliperidone (INVEGA) 24 hr tablet 9 mg  9 mg Oral QHS Oleg Oleson, [name], MD   9 mg at 07/31/19 2122  . traZODone (DESYREL) tablet 50 mg  50 mg Oral QHS PRN [name], MD   50 mg at 07/31/19 2122  . ziprasidone (GEODON) injection 20 mg  20 mg Intramuscular Q6H PRN Sharma Covert, MD        Lab Results: No results found for this or any previous visit (from the past 48 hour(s)).  Blood Alcohol level:  Lab Results  Component Value Date   [facility] <10 07/09/2019   ETH <10 [id number]    Metabolic Disorder Labs: Lab Results  Component Value Date   HGBA1C 5.2 05/17/2019   MPG 102.54 05/17/2019   MPG 108.28 02/17/2019   Lab Results   Component Value Date   PROLACTIN 31.9 (H) 02/17/2019   PROLACTIN 29.6 (H) 09/25/2016   Lab Results  Component Value Date   CHOL 144 05/17/2019   TRIG 65  05/17/2019   HDL 35 (L) 05/17/2019   CHOLHDL 4.1 05/17/2019   VLDL 13 05/17/2019   LDLCALC 96 05/17/2019   LDLCALC 76 02/17/2019    Physical Findings: AIMS:  , ,  ,  ,    CIWA:    COWS:     Musculoskeletal: Strength & Muscle Tone: within normal limits Gait & Station: normal Patient leans: N/A  Psychiatric Specialty Exam: Physical Exam  Nursing note and vitals reviewed. Constitutional: She appears well-developed and well-nourished.  HENT:  Head: Normocephalic and atraumatic.  Eyes: Pupils are equal, round, and reactive to light. Conjunctivae are normal.  Cardiovascular: Regular rhythm and normal heart sounds.  Respiratory: Effort normal.  GI: Soft.  Musculoskeletal:        General: Normal range of motion.     Cervical back: Normal range of motion.  Neurological: She is alert.  Skin: Skin is warm and dry.  Psychiatric: Her affect is blunt. Her speech is delayed. She is slowed. Cognition and memory are impaired. She expresses impulsivity. She expresses no homicidal and no suicidal ideation.    Review of Systems  Constitutional: Negative.   HENT: Negative.   Eyes: Negative.   Respiratory: Negative.   Cardiovascular: Negative.   Gastrointestinal: Negative.   Musculoskeletal: Negative.   Skin: Negative.   Neurological: Negative.   Psychiatric/Behavioral: Negative.     Blood pressure 109/71, pulse 92, temperature 98.4 F (36.9 C), temperature source Oral, resp. rate 18, height 5\' 4"  (1.626 m), weight 67.2 kg, SpO2 99 %.Body mass index is 25.43 kg/m.  General Appearance: Casual  Eye Contact:  Fair  Speech:  Slow  Volume:  Decreased  Mood:  Euthymic  Affect:  Constricted  Thought Process:  Coherent  Orientation:  Full (Time, Place, and Person)  Thought Content:  Rumination  Suicidal Thoughts:  No  Homicidal  Thoughts:  No  Memory:  Immediate;   Fair Recent;   Poor Remote;   Fair  Judgement:  Impaired  Insight:  Shallow  Psychomotor Activity:  Normal  Concentration:  Concentration: Poor  Recall:  [employer] of Knowledge:  Fair  Language:  Fair  Akathisia:  No  Handed:  Right  AIMS (if indicated):     Assets:  Physical Health  ADL's:  Impaired  Cognition:  Impaired,  Mild  Sleep:  Number of Hours: 7.5     Treatment Plan Summary: Daily contact with patient to assess and evaluate symptoms and progress in treatment, Medication management and Plan No change to medicine for today.  Supportive therapy and encouragement.  Encourage patient to keep working on trying to come up with ideas for discharge planning.  Alethia Berthold, MD 08/01/2019, 1:31 PM
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
## 2019-08-01 NOTE — Plan of Care (Signed)
D- Patient alert and oriented. Patient presents in a pleasant mood on assessment stating that she slept ok last night and the only complaint she had was of left foot pain, rating it a "7/10", reporting that she fractured her foot. Patient did request pain medication from this writer. Patient denies SI, HI, AVH, at this time. Patient also denies any signs/symptoms of depression/anxiety, reporting that she is "feeling pretty good". Patient had no stated goals for today.  A- Scheduled medications administered to patient, per MD orders. Support and encouragement provided.  Routine safety checks conducted every 15 minutes.  Patient informed to notify staff with problems or concerns.  R- No adverse drug reactions noted. Patient contracts for safety at this time. Patient compliant with medications and treatment plan. Patient receptive, calm, and cooperative. Patient interacts well with others on the unit.  Patient remains safe at this time.  Problem: Education: Goal: Utilization of techniques to improve thought processes will improve Outcome: Progressing Goal: Knowledge of the prescribed therapeutic regimen will improve Outcome: Progressing   Problem: Activity: Goal: Interest or engagement in leisure activities will improve Outcome: Progressing Goal: Imbalance in normal sleep/wake cycle will improve Outcome: Progressing   Problem: Coping: Goal: Coping ability will improve Outcome: Progressing Goal: Will verbalize feelings Outcome: Progressing   Problem: Health Behavior/Discharge Planning: Goal: Ability to make decisions will improve Outcome: Progressing Goal: Compliance with therapeutic regimen will improve Outcome: Progressing   Problem: Role Relationship: Goal: Will demonstrate positive changes in social behaviors and relationships Outcome: Progressing   Problem: Safety: Goal: Ability to disclose and discuss suicidal ideas will improve Outcome: Progressing Goal: Ability to identify  and utilize support systems that promote safety will improve Outcome: Progressing   Problem: Self-Concept: Goal: Will verbalize positive feelings about self Outcome: Progressing Goal: Level of anxiety will decrease Outcome: Progressing   Problem: Education: Goal: Knowledge of the prescribed therapeutic regimen will improve Outcome: Progressing   Problem: Coping: Goal: Coping ability will improve Outcome: Progressing   Problem: Education: Goal: Knowledge of [facility] Education information/materials will improve Outcome: Progressing Goal: Emotional status will improve Outcome: Progressing
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
## 2019-08-02 NOTE — Plan of Care (Signed)
Patient has remained cooperative and calm, denying hallucination and self harm thoughts. Thought process is organized. Patient is pleasant on approach and able to communicate needs and concerns appropriately. No sign of distress. Had a snack and medications and went to bed. Currently sleeping and safety maintained.

## 2019-08-02 NOTE — Progress Notes (Signed)
Patient has been sleeping and did not wake up for morning routine.

## 2019-08-02 NOTE — Plan of Care (Signed)
D- Patient alert and oriented. Patient presents in a pleasant mood on assessment, however, she seems to always have at least one outburst during the shift. Patient did not endorse any depression/anxiety to this Probation officer, however, she stated to this Probation officer that she feels like she is ready to go home. Patient also denies SI, HI, AVH, and pain at this time. Patient had no stated goals for today.  A- Scheduled medications administered to patient, per MD orders. Support and encouragement provided.  Routine safety checks conducted every 15 minutes.  Patient informed to notify staff with problems or concerns.  R- No adverse drug reactions noted. Patient contracts for safety at this time. Patient compliant with medications and treatment plan. Patient receptive, calm, and cooperative. Patient interacts well with others on the unit.  Patient remains safe at this time.  Problem: Education: Goal: Utilization of techniques to improve thought processes will improve Outcome: Not Progressing Goal: Knowledge of the prescribed therapeutic regimen will improve Outcome: Not Progressing   Problem: Activity: Goal: Interest or engagement in leisure activities will improve Outcome: Not Progressing Goal: Imbalance in normal sleep/wake cycle will improve Outcome: Not Progressing   Problem: Coping: Goal: Coping ability will improve Outcome: Not Progressing Goal: Will verbalize feelings Outcome: Not Progressing   Problem: Health Behavior/Discharge Planning: Goal: Ability to make decisions will improve Outcome: Not Progressing Goal: Compliance with therapeutic regimen will improve Outcome: Not Progressing   Problem: Role Relationship: Goal: Will demonstrate positive changes in social behaviors and relationships Outcome: Not Progressing   Problem: Safety: Goal: Ability to disclose and discuss suicidal ideas will improve Outcome: Not Progressing Goal: Ability to identify and utilize support systems that promote  safety will improve Outcome: Not Progressing   Problem: Self-Concept: Goal: Will verbalize positive feelings about self Outcome: Not Progressing Goal: Level of anxiety will decrease Outcome: Not Progressing   Problem: Education: Goal: Knowledge of the prescribed therapeutic regimen will improve Outcome: Not Progressing   Problem: Coping: Goal: Coping ability will improve Outcome: Not Progressing   Problem: Education: Goal: Knowledge of [facility] Education information/materials will improve Outcome: Not Progressing Goal: Emotional status will improve Outcome: Not Progressing

## 2019-08-02 NOTE — BHH Counselor (Signed)
CSW followed up with Suanne Marker in [employer]. CSW left another voicemail.  [name], MSW, LCSW 08/02/2019 9:42 AM

## 2019-08-02 NOTE — Progress Notes (Signed)
Patient came out of group and came to the nurses station and yelled "I can't stand that bitch", in regards to one of the social workers. Patient stated to another social worker "you and your little friend, all you keep asking me is what's the plan, what's the plan". This Probation officer along with other staff were trying to calm patient down, however, patient was receptive to taking PRN medication. Patient tolerated medication well and remains safe on the unit.

## 2019-08-02 NOTE — Progress Notes (Signed)
Recreation Therapy Notes  Date: 08/02/2019  Time: 9:30 am   Location: Craft room   Behavioral response: N/A   Intervention Topic: Problem Solving  Discussion/Intervention: Patient did not attend group.   Clinical Observations/Feedback:  Patient did not attend group.   Kelise Kuch LRT/CTRS        Neville Walston 08/02/2019 11:09 AM
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
## 2019-08-03 NOTE — Progress Notes (Signed)
Recreation Therapy Notes  Date: 08/03/2019  Time: 9:30 am   Location: Craft room   Behavioral response: N/A   Intervention Topic: Self-care  Discussion/Intervention: Patient did not attend group.   Clinical Observations/Feedback:  Patient did not attend group.   Yarelly Kuba LRT/CTRS        Paddy Neis 08/03/2019 11:10 AM

## 2019-08-03 NOTE — B[facility] Counselor (Signed)
CSW supported patient in calling her boyfriend.  Message was received that call can not be completed as dialed.  [name], MSW, LCSW 08/03/2019 2:47 PM
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
## 2019-08-03 NOTE — B[facility] Counselor (Signed)
CSW followed up with Suanne Marker in [employer].   Suanne Marker reports that she doesn't remember the patient and CSW again provided the name of patient.  After a pause, CSW was asked to have patient call Suanne Marker before 11:30AM.  [name], MSW, LCSW 08/03/2019 10:43 AM

## 2019-08-03 NOTE — Progress Notes (Signed)
Patient was in her room upon arrival to the unit. Patient .Patient denies SI/HI/AVH, anxiety and pain.Patient compliant with medication administration per MD orders. Patient given education. Patient given support and encouragement to be active in her treatment plan. Patient being monitored Q 15 minutes for safety per unit protocol. Patient remains safe on the unit.Patient observed interacting appropriately with staff and peers on the unit.

## 2019-08-03 NOTE — B[facility] Counselor (Signed)
CSW assisted patient in completing initial paperwork for Medicaid.   CSW supported patient in completing assessment with [facility] for ACT referral. [facility] reports that they would be happy to work with the patient if she remained in the [place] or [place] areas.  She pointed out that [facility] does not serve [facility].  [name], MSW, LCSW 08/03/2019 2:49 PM

## 2019-08-03 NOTE — Progress Notes (Signed)
[facility] MD Progress Note  08/03/2019 2:57 PM Julia [name]  MRN:  [id number] Subjective: Follow-up for this young woman with schizophrenia.  Today was another day of fairly adequate behavior.  As far as I know she has not gone often done anything agitated since yesterday afternoon although she certainly did have an episode of breakdown yesterday afternoon.  It is clear that she is still having psychotic thoughts although she tries to minimize it in conversation.  Her affect remains blank.  She is still talking about how she can be discharged to go with "Darnelle Maffucci" but I am pretty sure that social work already ruled that out.  Does not appear to be a akathetic or having any extraparametal symptoms Principal Problem: Psychosis ([place]) Diagnosis: Principal Problem:   Psychosis ([place]) Active Problems:   Polysubstance abuse ([place])   Depressed mood   Substance induced mood disorder ([place])  Total Time spent with patient: 30 minutes  Past Psychiatric History: Patient has a past history of several hospitalizations  Past Medical History:  Past Medical History:  Diagnosis Date  . Burning with urination 05/03/2015  . Contraceptive management 05/03/2015  . Depression   . Dysmenorrhea 12/30/2013  . Heroin addiction ([place])   . Menorrhagia 12/30/2013  . Menstrual extraction 12/30/2013  . Migraines   . Social anxiety disorder 09/25/2016  . Suicidal ideations   . Vaginal odor 05/03/2015    Past Surgical History:  Procedure Laterality Date  . NO PAST SURGERIES     Family History:  Family History  Problem Relation Age of Onset  . Depression Mother   . Hypertension Father   . Hyperlipidemia Father   . Cancer Paternal Grandmother        breast, uterine  . Cirrhosis Paternal Grandfather        due to alcohol   Family Psychiatric  History: See previous Social History:  Social History   Substance and Sexual Activity  Alcohol Use Yes   Comment: BAC was clear     Social History   Substance and Sexual  Activity  Drug Use Yes  . Types: Marijuana, Oxycodone   Comment: reports oxycodone and marijuana    Social History   Socioeconomic History  . Marital status: Single    Spouse name: Not on file  . Number of children: Not on file  . Years of education: Not on file  . Highest education level: Not on file  Occupational History  . Occupation: Unemployed  Tobacco Use  . Smoking status: Current Every Day Smoker    Packs/day: 0.50    Types: Cigarettes  . Smokeless tobacco: Never Used  . Tobacco comment: Not ready to quit  Substance and Sexual Activity  . Alcohol use: Yes    Comment: BAC was clear  . Drug use: Yes    Types: Marijuana, Oxycodone    Comment: reports oxycodone and marijuana  . Sexual activity: Yes    Birth control/protection: Implant  Other Topics Concern  . Not on file  Social History Narrative   06/23/2019:  Pt stated that she is homeless, that she is a high school graduate, and that she is unemployed and not followed by any outpatient provider.      Lives with Dad. Mom has LGD, lives in [place]. 11th grader. Dog.   Social Determinants of Health   Financial Resource Strain:   . Difficulty of Paying Living Expenses: Not on file  Food Insecurity:   . Worried About Charity fundraiser in the  Last Year: Not on file  . Ran Out of Food in the Last Year: Not on file  Transportation Needs:   . Lack of Transportation (Medical): Not on file  . Lack of Transportation (Non-Medical): Not on file  Physical Activity:   . Days of Exercise per Week: Not on file  . Minutes of Exercise per Session: Not on file  Stress:   . Feeling of Stress : Not on file  Social Connections:   . Frequency of Communication with Friends and Family: Not on file  . Frequency of Social Gatherings with Friends and Family: Not on file  . Attends Religious Services: Not on file  . Active Member of Clubs or Organizations: Not on file  . Attends Archivist Meetings: Not on file  . Marital  Status: Not on file   Additional Social History:                         Sleep: Fair  Appetite:  Fair  Current Medications: Current Facility-Administered Medications  Medication Dose Route Frequency Provider Last Rate Last Admin  . acetaminophen (TYLENOL) tablet 650 mg  650 mg Oral Q6H PRN [name], MD   650 mg at 07/29/19 1732  . alum & mag hydroxide-simeth (MAALOX/MYLANTA) 200-200-20 MG/5ML suspension 30 mL  30 mL Oral Q4H PRN [name], MD      . benztropine (COGENTIN) tablet 2 mg  2 mg Oral BID PRN [name], MD   2 mg at 07/25/19 1832   Or  . benztropine mesylate (COGENTIN) injection 2 mg  2 mg Intramuscular BID PRN [name], MD      . diphenhydrAMINE (BENADRYL) capsule 50 mg  50 mg Oral Q6H PRN [name], MD   50 mg at 08/02/19 1340   Or  . diphenhydrAMINE (BENADRYL) injection 50 mg  50 mg Intramuscular Q6H PRN [name], MD   50 mg at 07/25/19 1832  . FLUoxetine (PROZAC) capsule 40 mg  40 mg Oral Daily [name], MD   40 mg at 08/03/19 0819  . haloperidol (HALDOL) tablet 10 mg  10 mg Oral Q6H PRN Sharma Covert, MD   10 mg at 08/02/19 1340   Or  . haloperidol lactate (HALDOL) injection 10 mg  10 mg Intramuscular Q6H PRN Sharma Covert, MD   10 mg at 07/23/19 2052  . ibuprofen (ADVIL) tablet 600 mg  600 mg Oral Q6H PRN Bentlee Benningfield T, MD   600 mg at 08/01/19 1120  . magnesium hydroxide (MILK OF MAGNESIA) suspension 30 mL  30 mL Oral Daily PRN [name], MD      . paliperidone (INVEGA) 24 hr tablet 9 mg  9 mg Oral QHS Kapri Nero, [name], MD   9 mg at 08/02/19 2224  . traZODone (DESYREL) tablet 50 mg  50 mg Oral QHS PRN [name], MD   50 mg at 07/31/19 2122  . ziprasidone (GEODON) injection 20 mg  20 mg Intramuscular Q6H PRN Sharma Covert, MD        Lab Results: No results found for this or any previous visit (from the past 48 hour(s)).  Blood Alcohol level:  Lab Results  Component Value Date    [facility] <10 07/09/2019   ETH <10 [id number]    Metabolic Disorder Labs: Lab Results  Component Value Date   HGBA1C 5.2 05/17/2019   MPG 102.54 05/17/2019  MPG 108.28 02/17/2019   Lab Results  Component Value Date   PROLACTIN 31.9 (H) 02/17/2019   PROLACTIN 29.6 (H) 09/25/2016   Lab Results  Component Value Date   CHOL 144 05/17/2019   TRIG 65 05/17/2019   HDL 35 (L) 05/17/2019   CHOLHDL 4.1 05/17/2019   VLDL 13 05/17/2019   LDLCALC 96 05/17/2019   LDLCALC 76 02/17/2019    Physical Findings: AIMS:  , ,  ,  ,    CIWA:    COWS:     Musculoskeletal: Strength & Muscle Tone: within normal limits Gait & Station: normal Patient leans: N/A  Psychiatric Specialty Exam: Physical Exam  Nursing note and vitals reviewed. Constitutional: She appears well-developed and well-nourished.  HENT:  Head: Normocephalic and atraumatic.  Eyes: Pupils are equal, round, and reactive to light. Conjunctivae are normal.  Cardiovascular: Regular rhythm and normal heart sounds.  Respiratory: Effort normal.  GI: Soft.  Musculoskeletal:        General: Normal range of motion.     Cervical back: Normal range of motion.  Neurological: She is alert.  Skin: Skin is warm and dry.  Psychiatric: Her affect is blunt. Her speech is delayed. She is slowed. Thought content is paranoid. Cognition and memory are impaired. She expresses inappropriate judgment. She expresses no homicidal and no suicidal ideation.    Review of Systems  Constitutional: Negative.   HENT: Negative.   Eyes: Negative.   Respiratory: Negative.   Cardiovascular: Negative.   Gastrointestinal: Negative.   Musculoskeletal: Negative.   Skin: Negative.   Neurological: Negative.   Psychiatric/Behavioral: Positive for confusion. Negative for agitation and behavioral problems. The patient is nervous/anxious.     Blood pressure 122/77, pulse (!) 110, temperature 98.1 F (36.7 C), temperature source Oral, resp. rate 18, height 5\' 4"   (1.626 m), weight 67.2 kg, SpO2 100 %.Body mass index is 25.43 kg/m.  General Appearance: Casual  Eye Contact:  Fair  Speech:  Slow  Volume:  Decreased  Mood:  Dysphoric  Affect:  Flat  Thought Process:  Coherent  Orientation:  Full (Time, Place, and Person)  Thought Content:  Illogical and Rumination  Suicidal Thoughts:  No  Homicidal Thoughts:  No  Memory:  Immediate;   Fair Recent;   Fair Remote;   Fair  Judgement:  Impaired  Insight:  Shallow  Psychomotor Activity:  Decreased  Concentration:  Concentration: Fair  Recall:  [employer] of Knowledge:  Fair  Language:  Fair  Akathisia:  No  Handed:  Right  AIMS (if indicated):     Assets:  Desire for Improvement  ADL's:  Impaired  Cognition:  Impaired,  Mild  Sleep:  Number of Hours: 8     Treatment Plan Summary: Daily contact with patient to assess and evaluate symptoms and progress in treatment, Medication management and Plan Since I do not see any obvious symptoms of antipsychotic side effects I am going to increase the Invega again if possible.  Patient will continue to be encouraged to participate in groups and we will continue to monitor for behavior and thought problems.  Alethia Berthold, MD 08/03/2019, 2:57 PM

## 2019-08-03 NOTE — Plan of Care (Signed)
Patient has had a better day today. She has been present in the milieu interacting well with peers, without any issues. Patient also had an interview with [facility] for possible placement with an ACT team. Patient has not had any outbursts and remains safe on the unit at this time.   Problem: Education: Goal: Utilization of techniques to improve thought processes will improve Outcome: Progressing Goal: Knowledge of the prescribed therapeutic regimen will improve Outcome: Progressing   Problem: Activity: Goal: Interest or engagement in leisure activities will improve Outcome: Progressing Goal: Imbalance in normal sleep/wake cycle will improve Outcome: Progressing   Problem: Coping: Goal: Coping ability will improve Outcome: Progressing Goal: Will verbalize feelings Outcome: Progressing   Problem: Health Behavior/Discharge Planning: Goal: Ability to make decisions will improve Outcome: Progressing Goal: Compliance with therapeutic regimen will improve Outcome: Progressing   Problem: Role Relationship: Goal: Will demonstrate positive changes in social behaviors and relationships Outcome: Progressing   Problem: Safety: Goal: Ability to disclose and discuss suicidal ideas will improve Outcome: Progressing Goal: Ability to identify and utilize support systems that promote safety will improve Outcome: Progressing   Problem: Self-Concept: Goal: Will verbalize positive feelings about self Outcome: Progressing Goal: Level of anxiety will decrease Outcome: Progressing   Problem: Education: Goal: Knowledge of the prescribed therapeutic regimen will improve Outcome: Progressing   Problem: Coping: Goal: Coping ability will improve Outcome: Progressing   Problem: Education: Goal: Knowledge of [facility] Education information/materials will improve Outcome: Progressing Goal: Emotional status will improve Outcome: Progressing

## 2019-08-03 NOTE — [facility] Group Notes (Signed)
  LCSW Group Therapy Note  08/03/2019 12:01 PM   Type of Therapy/Topic:  Group Therapy:  Feelings about Diagnosis  Participation Level:  Minimal   Description of Group:   This group will allow patients to explore their thoughts and feelings about diagnoses they have received. Patients will be guided to explore their level of understanding and acceptance of these diagnoses. Facilitator will encourage patients to process their thoughts and feelings about the reactions of others to their diagnosis and will guide patients in identifying ways to discuss their diagnosis with significant others in their lives. This group will be process-oriented, with patients participating in exploration of their own experiences, giving and receiving support, and processing challenge from other group members.   Therapeutic Goals: 1. Patient will demonstrate understanding of diagnosis as evidenced by identifying two or more symptoms of the disorder 2. Patient will be able to express two feelings regarding the diagnosis 3. Patient will demonstrate their ability to communicate their needs through discussion and/or role play  Summary of Patient Progress: Pt was present in group for the first half and participated in the activity, but did not engage in any discussion.   Therapeutic Modalities:   Cognitive Behavioral Therapy Brief Therapy Feelings Identification    Evalina Field, MSW, LCSW Clinical Social Work 08/03/2019 12:01 PM

## 2019-08-03 NOTE — Progress Notes (Signed)
CSW spoke with pts Care Coordinator, Jolyn Lent and updated her on CSW team contacting financial counseling to start process for pt getting medicaid and filing APS report for pt to get a legal guardian. Chervonne reported that if anyone needed her to send any information over to let her know and she will send it.   Evalina Field, MSW, LCSW Clinical Social Work 08/03/2019 1:53 PM

## 2019-08-03 NOTE — BHH Counselor (Signed)
CSW received a return call from [place] at [facility].  He reports that patient's case was "denied but referred for guardianship evaluation".  He gave the following contact information for the DSS worker to follow up with [name] [phone] and transferred this CSW's call.  CSW spoke with [name] who reports that per the report provided patient "id a good candidate for a guardian". She report that the next steps are for DSS to "compile a guardianship packet, take that packet to the DSS attorney's and then to the county clerk of court.  She reports that the packet includes the medical, mental and background information in an effort to identify who should be the surrogate guardian.  She identified [name] as the contact person, she reports that [name] is not in the office and she is unable to transfer the call so she will email [name] and provide this CSW's contact information and ask for a follow up call today 08/03/2019.    [name], MSW, LCSW 08/03/2019 10:48 AM

## 2019-08-03 NOTE — Progress Notes (Signed)
   08/03/19 1500  Clinical Encounter Type  Visited With Patient  Visit Type Initial  Referral From Nurse  Consult/Referral To Chaplain  Chaplain visited patient in response to a page. Nurse said patient requested a Bible and wants to talk. Chaplain took patient a Bible and a Daily Word Devotional. Patient said she does not want to talk at this time. Chaplain took patient that if she wants to talk that she would come back down. Chaplain offered pastoral presence and empathy.

## 2019-08-03 NOTE — Progress Notes (Signed)
Patient is alert and oriented X 4, denies SI, HI and AVH today. Patient is flat and blunted. Patient denies Anxiety/ Depression during morning assessment.Patient appetite is good although did not want to eat breakfast this morning. Patient continues to have 15 minutes safety checks to continue.

## 2019-08-03 NOTE — Plan of Care (Signed)
Patient still endorsing anxiety  Problem: Self-Concept: Goal: Level of anxiety will decrease Outcome: Not Progressing

## 2019-08-04 NOTE — Progress Notes (Signed)
[facility] MD Progress Note  08/04/2019 3:22 PM Julia [name]  MRN:  [id number] Subjective: Patient seen chart reviewed.  She has settled down quite a bit the last few days.  She was in her room looking out the window today but engaged in appropriate conversation.  Had no new complaints.  Denies having any hallucinations or feelings of persecution today.  She has not been showing the same emotional lability.  She has been able to attend some groups without losing her temper or becoming psychotic.  Does not appear to be akathetic or having any specific side effects from medicine. Principal Problem: Psychosis ([place]) Diagnosis: Principal Problem:   Psychosis ([place]) Active Problems:   Polysubstance abuse ([place])   Depressed mood   Substance induced mood disorder ([place])  Total Time spent with patient: 30 minutes  Past Psychiatric History: Patient has a history of repeated psychotic episodes probably schizophrenia in addition to a history of substance abuse  Past Medical History:  Past Medical History:  Diagnosis Date  . Burning with urination 05/03/2015  . Contraceptive management 05/03/2015  . Depression   . Dysmenorrhea 12/30/2013  . Heroin addiction ([place])   . Menorrhagia 12/30/2013  . Menstrual extraction 12/30/2013  . Migraines   . Social anxiety disorder 09/25/2016  . Suicidal ideations   . Vaginal odor 05/03/2015    Past Surgical History:  Procedure Laterality Date  . NO PAST SURGERIES     Family History:  Family History  Problem Relation Age of Onset  . Depression Mother   . Hypertension Father   . Hyperlipidemia Father   . Cancer Paternal Grandmother        breast, uterine  . Cirrhosis Paternal Grandfather        due to alcohol   Family Psychiatric  History: See previous Social History:  Social History   Substance and Sexual Activity  Alcohol Use Yes   Comment: BAC was clear     Social History   Substance and Sexual Activity  Drug Use Yes  . Types: Marijuana, Oxycodone    Comment: reports oxycodone and marijuana    Social History   Socioeconomic History  . Marital status: Single    Spouse name: Not on file  . Number of children: Not on file  . Years of education: Not on file  . Highest education level: Not on file  Occupational History  . Occupation: Unemployed  Tobacco Use  . Smoking status: Current Every Day Smoker    Packs/day: 0.50    Types: Cigarettes  . Smokeless tobacco: Never Used  . Tobacco comment: Not ready to quit  Substance and Sexual Activity  . Alcohol use: Yes    Comment: BAC was clear  . Drug use: Yes    Types: Marijuana, Oxycodone    Comment: reports oxycodone and marijuana  . Sexual activity: Yes    Birth control/protection: Implant  Other Topics Concern  . Not on file  Social History Narrative   06/23/2019:  Pt stated that she is homeless, that she is a high school graduate, and that she is unemployed and not followed by any outpatient provider.      Lives with Dad. Mom has LGD, lives in [place]. 11th grader. Dog.   Social Determinants of Health   Financial Resource Strain:   . Difficulty of Paying Living Expenses: Not on file  Food Insecurity:   . Worried About Charity fundraiser in the Last Year: Not on file  . Ran Out of  Food in the Last Year: Not on file  Transportation Needs:   . Lack of Transportation (Medical): Not on file  . Lack of Transportation (Non-Medical): Not on file  Physical Activity:   . Days of Exercise per Week: Not on file  . Minutes of Exercise per Session: Not on file  Stress:   . Feeling of Stress : Not on file  Social Connections:   . Frequency of Communication with Friends and Family: Not on file  . Frequency of Social Gatherings with Friends and Family: Not on file  . Attends Religious Services: Not on file  . Active Member of Clubs or Organizations: Not on file  . Attends Archivist Meetings: Not on file  . Marital Status: Not on file   Additional Social History:                          Sleep: Fair  Appetite:  Fair  Current Medications: Current Facility-Administered Medications  Medication Dose Route Frequency Provider Last Rate Last Admin  . acetaminophen (TYLENOL) tablet 650 mg  650 mg Oral Q6H PRN [name], MD   650 mg at 07/29/19 1732  . alum & mag hydroxide-simeth (MAALOX/MYLANTA) 200-200-20 MG/5ML suspension 30 mL  30 mL Oral Q4H PRN [name], MD      . benztropine (COGENTIN) tablet 2 mg  2 mg Oral BID PRN [name], MD   2 mg at 08/03/19 1902   Or  . benztropine mesylate (COGENTIN) injection 2 mg  2 mg Intramuscular BID PRN [name], MD      . diphenhydrAMINE (BENADRYL) capsule 50 mg  50 mg Oral Q6H PRN [name], MD   50 mg at 08/02/19 1340   Or  . diphenhydrAMINE (BENADRYL) injection 50 mg  50 mg Intramuscular Q6H PRN [name], MD   50 mg at 08/03/19 1902  . FLUoxetine (PROZAC) capsule 40 mg  40 mg Oral Daily [name], MD   40 mg at 08/04/19 0849  . haloperidol (HALDOL) tablet 10 mg  10 mg Oral Q6H PRN Sharma Covert, MD   10 mg at 08/02/19 1340   Or  . haloperidol lactate (HALDOL) injection 10 mg  10 mg Intramuscular Q6H PRN Sharma Covert, MD   10 mg at 07/23/19 2052  . ibuprofen (ADVIL) tablet 600 mg  600 mg Oral Q6H PRN Shellene Sweigert T, MD   600 mg at 08/01/19 1120  . magnesium hydroxide (MILK OF MAGNESIA) suspension 30 mL  30 mL Oral Daily PRN [name], MD      . paliperidone (INVEGA) 24 hr tablet 9 mg  9 mg Oral QHS Elmus Mathes, [name], MD   9 mg at 08/03/19 2137  . traZODone (DESYREL) tablet 50 mg  50 mg Oral QHS PRN [name], MD   50 mg at 08/03/19 2137  . ziprasidone (GEODON) injection 20 mg  20 mg Intramuscular Q6H PRN Sharma Covert, MD        Lab Results: No results found for this or any previous visit (from the past 48 hour(s)).  Blood Alcohol level:  Lab Results  Component Value Date   [facility] <10 07/09/2019   ETH <10 [id number]    Metabolic  Disorder Labs: Lab Results  Component Value Date   HGBA1C 5.2 05/17/2019   MPG 102.54 05/17/2019   MPG 108.28 02/17/2019   Lab Results  Component  Value Date   PROLACTIN 31.9 (H) 02/17/2019   PROLACTIN 29.6 (H) 09/25/2016   Lab Results  Component Value Date   CHOL 144 05/17/2019   TRIG 65 05/17/2019   HDL 35 (L) 05/17/2019   CHOLHDL 4.1 05/17/2019   VLDL 13 05/17/2019   LDLCALC 96 05/17/2019   LDLCALC 76 02/17/2019    Physical Findings: AIMS:  , ,  ,  ,    CIWA:    COWS:     Musculoskeletal: Strength & Muscle Tone: within normal limits Gait & Station: normal Patient leans: N/A  Psychiatric Specialty Exam: Physical Exam  Nursing note and vitals reviewed. Constitutional: She appears well-developed and well-nourished.  HENT:  Head: Normocephalic and atraumatic.  Eyes: Pupils are equal, round, and reactive to light. Conjunctivae are normal.  Cardiovascular: Regular rhythm and normal heart sounds.  Respiratory: Effort normal. No respiratory distress.  GI: Soft.  Musculoskeletal:        General: Normal range of motion.     Cervical back: Normal range of motion.  Neurological: She is alert.  Skin: Skin is warm and dry.  Psychiatric: Her affect is blunt. Her speech is delayed. She is slowed. Cognition and memory are impaired. She expresses impulsivity. She expresses no homicidal and no suicidal ideation.    Review of Systems  Constitutional: Negative.   HENT: Negative.   Eyes: Negative.   Respiratory: Negative.   Cardiovascular: Negative.   Gastrointestinal: Negative.   Musculoskeletal: Negative.   Skin: Negative.   Neurological: Negative.   Psychiatric/Behavioral: Negative.     Blood pressure 122/77, pulse (!) 110, temperature 98.1 F (36.7 C), temperature source Oral, resp. rate 18, height 5\' 4"  (1.626 m), weight 67.2 kg, SpO2 100 %.Body mass index is 25.43 kg/m.  General Appearance: Casual  Eye Contact:  Good  Speech:  Clear and Coherent  Volume:   Decreased  Mood:  Euthymic  Affect:  Congruent  Thought Process:  Coherent  Orientation:  Full (Time, Place, and Person)  Thought Content:  Rumination  Suicidal Thoughts:  No  Homicidal Thoughts:  No  Memory:  Immediate;   Fair Recent;   Fair Remote;   Fair  Judgement:  Impaired  Insight:  Shallow  Psychomotor Activity:  Decreased  Concentration:  Concentration: Fair  Recall:  [employer] of Knowledge:  Fair  Language:  Fair  Akathisia:  No  Handed:  Right  AIMS (if indicated):     Assets:  Desire for Improvement  ADL's:  Impaired  Cognition:  Impaired,  Mild  Sleep:  Number of Hours: 8.25     Treatment Plan Summary: Daily contact with patient to assess and evaluate symptoms and progress in treatment, Medication management and Plan I really think she is improved quite a bit and probably if we had a clear-cut place to go especially something like a family home or a group home she would be ready for discharge.  Encouragement to the patient.  Reviewed medicines.  We are going to continue working on outpatient treatment.  Alethia Berthold, MD 08/04/2019, 3:22 PM

## 2019-08-04 NOTE — Progress Notes (Signed)
Recreation Therapy Notes  Date: 08/04/2019  Time: 9:30 am  Location: Craft room  Behavioral response: Appropriate  Intervention Topic: Necessities   Discussion/Intervention:  Group content on today was focused on necessities. The group defined necessities and how they determine their necessities. Individuals expressed how many necessities they have and if it changes from day to day. Patients described the difference between wants and needs. The group explained how they have overspent on wants in the past. Individuals described a reoccurring necessity for them. The intervention "What I need" helped patients differentiate between wants and needs.  Clinical Observations/Feedback:  Patient came to group and was focused on what peers and staff  Had to say about necessities. Individual participated in the group activity.   Julia [name] LRT/CTRS         Enrique Weiss 08/04/2019 11:35 AM

## 2019-08-04 NOTE — Plan of Care (Signed)
Patient stated her anxiety was better this evening compared to last night  Problem: Self-Concept: Goal: Level of anxiety will decrease Outcome: Progressing

## 2019-08-04 NOTE — BHH Group Notes (Signed)
[facility] Group Notes:  (Nursing/MHT/Case Management/Adjunct)  Date:  08/04/2019  Time:  9:20 PM  Type of Therapy:  Group Therapy  Participation Level:  None  Participation Quality:  Walk out of group.  Summary of Progress/Problems:  Julia [name] 08/04/2019, 9:20 PM

## 2019-08-04 NOTE — Plan of Care (Signed)
Pt denies anxiety, depression, SI, HI and AVH. Pt was educated on care plan and verbalizes understanding. Collier Bullock RN Problem: Education: Goal: Utilization of techniques to improve thought processes will improve Outcome: Progressing Goal: Knowledge of the prescribed therapeutic regimen will improve Outcome: Progressing   Problem: Activity: Goal: Interest or engagement in leisure activities will improve Outcome: Progressing Goal: Imbalance in normal sleep/wake cycle will improve Outcome: Progressing   Problem: Coping: Goal: Coping ability will improve Outcome: Progressing Goal: Will verbalize feelings Outcome: Progressing   Problem: Health Behavior/Discharge Planning: Goal: Ability to make decisions will improve Outcome: Progressing Goal: Compliance with therapeutic regimen will improve Outcome: Progressing   Problem: Role Relationship: Goal: Will demonstrate positive changes in social behaviors and relationships Outcome: Progressing   Problem: Safety: Goal: Ability to disclose and discuss suicidal ideas will improve Outcome: Progressing Goal: Ability to identify and utilize support systems that promote safety will improve Outcome: Progressing   Problem: Self-Concept: Goal: Will verbalize positive feelings about self Outcome: Progressing Goal: Level of anxiety will decrease Outcome: Progressing   Problem: Education: Goal: Knowledge of the prescribed therapeutic regimen will improve Outcome: Progressing   Problem: Coping: Goal: Coping ability will improve Outcome: Progressing   Problem: Education: Goal: Knowledge of [facility] Education information/materials will improve Outcome: Progressing Goal: Emotional status will improve Outcome: Progressing

## 2019-08-04 NOTE — BHH Counselor (Signed)
CSW attempted to follow up with [facility].  CSW called [name] back as requested but had to leave a HIPAA compliant voicemail.  At the time of this update no return call has been made.    [name], MSW, LCSW 08/04/2019 3:18 PM

## 2019-08-04 NOTE — Progress Notes (Signed)
Pt stayed in the bed fo while this morning after trays and medication pass. She finally came up and was calm and cooperative. Pt went to all the groups today. I asked her if she learned anything and she said "no". She has been in the dayroom watching tv. She had one episode of bursting out into laughter in the lunch room. Collier Bullock RN

## 2019-08-04 NOTE — Progress Notes (Signed)
Patient was in her room upon arrival to the unit. Patient .Patient denies SI/HI/AVH, anxiety and pain.Patient compliant with medication administration per MD orders. Patient given education. Patient given support and encouragement to be active in her treatment plan. Patient being monitored Q 15 minutes for safety per unit protocol. Patient remains safe on the unit.Patient observed interacting appropriately with staff and peers on the unit.

## 2019-08-04 NOTE — BHH Counselor (Signed)
CSW followed up with the [place],  [name] [phone].  CSW informed that Julia [name] did not reach out as she was expected to on 08/03/2019.  CSW asked for contact information for Julia [name] and was provided the following agency cell number [phone].    [name] called Julia [name] but there was no response.  She asked that CSW follow up in the next 30 minutes if Julia [name] does not respond.  She attempted to see if another social worker would be working on the case but [name] is the only one assigned to it.  [name], MSW, LCSW 08/04/2019 2:09 PM

## 2019-08-04 NOTE — BHH Group Notes (Signed)
Emotional Regulation 08/04/2019 1PM  Type of Therapy/Topic:  Group Therapy:  Emotion Regulation  Participation Level:  None   Description of Group:   The purpose of this group is to assist patients in learning to regulate negative emotions and experience positive emotions. Patients will be guided to discuss ways in which they have been vulnerable to their negative emotions. These vulnerabilities will be juxtaposed with experiences of positive emotions or situations, and patients will be challenged to use positive emotions to combat negative ones. Special emphasis will be placed on coping with negative emotions in conflict situations, and patients will process healthy conflict resolution skills.  Therapeutic Goals: 1. Patient will identify two positive emotions or experiences to reflect on in order to balance out negative emotions 2. Patient will label two or more emotions that they find the most difficult to experience 3. Patient will demonstrate positive conflict resolution skills through discussion and/or role plays  Summary of Patient Progress: No input provided, pt sat quietly and did not interact with group members.    Therapeutic Modalities:   Cognitive Behavioral Therapy Feelings Identification Dialectical Behavioral Therapy   Yvette Rack, LCSW 08/04/2019 1:49 PM

## 2019-08-05 NOTE — Plan of Care (Signed)
Aware of  medication received. Patient aware of [facility] education , able to verbalize understanding  with redirection from staff. Emotional and mental  health status  improving . Able to maintain frustration  no anger management  concerns. V oice of no safety concerns . Working on Scientific laboratory technician.  Problem: Education: Goal: Utilization of techniques to improve thought processes will improve Outcome: Progressing Goal: Knowledge of the prescribed therapeutic regimen will improve Outcome: Progressing   Problem: Activity: Goal: Interest or engagement in leisure activities will improve Outcome: Progressing Goal: Imbalance in normal sleep/wake cycle will improve Outcome: Progressing   Problem: Coping: Goal: Coping ability will improve Outcome: Progressing   Problem: Coping: Goal: Coping ability will improve Outcome: Progressing Goal: Will verbalize feelings Outcome: Progressing   Problem: Health Behavior/Discharge Planning: Goal: Ability to make decisions will improve Outcome: Progressing Goal: Compliance with therapeutic regimen will improve Outcome: Progressing   Problem: Safety: Goal: Ability to disclose and discuss suicidal ideas will improve Outcome: Progressing Goal: Ability to identify and utilize support systems that promote safety will improve Outcome: Progressing   Problem: Self-Concept: Goal: Will verbalize positive feelings about self Outcome: Progressing Goal: Level of anxiety will decrease Outcome: Progressing   Problem: Education: Goal: Knowledge of the prescribed therapeutic regimen will improve Outcome: Progressing   Problem: Coping: Goal: Coping ability will improve Outcome: Progressing   Problem: Education: Goal: Knowledge of [facility] Education information/materials will improve Outcome: Progressing Goal: Emotional status will improve Outcome: Progressing

## 2019-08-05 NOTE — Progress Notes (Signed)
Care of patient taken over at 2300, resting in bed quietly at this time, no issues noted on shift. Patient seems to be in bed resting quietly.

## 2019-08-05 NOTE — Progress Notes (Signed)
[facility] MD Progress Note  08/05/2019 3:40 PM Julia [name]  MRN:  [id number] Subjective: Patient seen chart reviewed.  Patient earlier in the day had seemed pretty stable the way she had been for the last few days.  Keeps to herself much of the time but able to interact more appropriately.  Fairly quiet.  Still a blank look on her face but taking care of her hygiene.  This afternoon for no clear reason the patient became suddenly very agitated.  Started screaming at the top of her lungs.  When staff came to check on her she became aggressive and was making obscene gestures although she did not actually assaulted anyone.  Patient was inconsolable for several minutes but ultimately was able to calm herself down without needing restraint or an injection.  Not sure what sets this off. Principal Problem: Psychosis ([place]) Diagnosis: Principal Problem:   Psychosis ([place]) Active Problems:   Polysubstance abuse ([place])   Depressed mood   Substance induced mood disorder ([place])  Total Time spent with patient: 30 minutes  Past Psychiatric History: Past history of recurrent hospitalizations see previous notes  Past Medical History:  Past Medical History:  Diagnosis Date  . Burning with urination 05/03/2015  . Contraceptive management 05/03/2015  . Depression   . Dysmenorrhea 12/30/2013  . Heroin addiction ([place])   . Menorrhagia 12/30/2013  . Menstrual extraction 12/30/2013  . Migraines   . Social anxiety disorder 09/25/2016  . Suicidal ideations   . Vaginal odor 05/03/2015    Past Surgical History:  Procedure Laterality Date  . NO PAST SURGERIES     Family History:  Family History  Problem Relation Age of Onset  . Depression Mother   . Hypertension Father   . Hyperlipidemia Father   . Cancer Paternal Grandmother        breast, uterine  . Cirrhosis Paternal Grandfather        due to alcohol   Family Psychiatric  History: See previous Social History:  Social History   Substance and Sexual  Activity  Alcohol Use Yes   Comment: BAC was clear     Social History   Substance and Sexual Activity  Drug Use Yes  . Types: Marijuana, Oxycodone   Comment: reports oxycodone and marijuana    Social History   Socioeconomic History  . Marital status: Single    Spouse name: Not on file  . Number of children: Not on file  . Years of education: Not on file  . Highest education level: Not on file  Occupational History  . Occupation: Unemployed  Tobacco Use  . Smoking status: Current Every Day Smoker    Packs/day: 0.50    Types: Cigarettes  . Smokeless tobacco: Never Used  . Tobacco comment: Not ready to quit  Substance and Sexual Activity  . Alcohol use: Yes    Comment: BAC was clear  . Drug use: Yes    Types: Marijuana, Oxycodone    Comment: reports oxycodone and marijuana  . Sexual activity: Yes    Birth control/protection: Implant  Other Topics Concern  . Not on file  Social History Narrative   06/23/2019:  Pt stated that she is homeless, that she is a high school graduate, and that she is unemployed and not followed by any outpatient provider.      Lives with Dad. Mom has LGD, lives in [place]. 11th grader. Dog.   Social Determinants of Health   Financial Resource Strain:   . Difficulty of  Paying Living Expenses: Not on file  Food Insecurity:   . Worried About Charity fundraiser in the Last Year: Not on file  . Ran Out of Food in the Last Year: Not on file  Transportation Needs:   . Lack of Transportation (Medical): Not on file  . Lack of Transportation (Non-Medical): Not on file  Physical Activity:   . Days of Exercise per Week: Not on file  . Minutes of Exercise per Session: Not on file  Stress:   . Feeling of Stress : Not on file  Social Connections:   . Frequency of Communication with Friends and Family: Not on file  . Frequency of Social Gatherings with Friends and Family: Not on file  . Attends Religious Services: Not on file  . Active Member of Clubs or  Organizations: Not on file  . Attends Archivist Meetings: Not on file  . Marital Status: Not on file   Additional Social History:                         Sleep: Fair  Appetite:  Fair  Current Medications: Current Facility-Administered Medications  Medication Dose Route Frequency Provider Last Rate Last Admin  . acetaminophen (TYLENOL) tablet 650 mg  650 mg Oral Q6H PRN [name], MD   650 mg at 07/29/19 1732  . alum & mag hydroxide-simeth (MAALOX/MYLANTA) 200-200-20 MG/5ML suspension 30 mL  30 mL Oral Q4H PRN [name], MD      . benztropine (COGENTIN) tablet 2 mg  2 mg Oral BID PRN [name], MD   2 mg at 08/03/19 1902   Or  . benztropine mesylate (COGENTIN) injection 2 mg  2 mg Intramuscular BID PRN [name], MD      . diphenhydrAMINE (BENADRYL) capsule 50 mg  50 mg Oral Q6H PRN [name], MD   50 mg at 08/02/19 1340   Or  . diphenhydrAMINE (BENADRYL) injection 50 mg  50 mg Intramuscular Q6H PRN [name], MD   50 mg at 08/03/19 1902  . FLUoxetine (PROZAC) capsule 40 mg  40 mg Oral Daily [name], MD   40 mg at 08/05/19 0849  . haloperidol (HALDOL) tablet 10 mg  10 mg Oral Q6H PRN Sharma Covert, MD   10 mg at 08/02/19 1340   Or  . haloperidol lactate (HALDOL) injection 10 mg  10 mg Intramuscular Q6H PRN Sharma Covert, MD   10 mg at 07/23/19 2052  . ibuprofen (ADVIL) tablet 600 mg  600 mg Oral Q6H PRN Katianna Mcclenney T, MD   600 mg at 08/01/19 1120  . magnesium hydroxide (MILK OF MAGNESIA) suspension 30 mL  30 mL Oral Daily PRN [name], MD      . paliperidone (INVEGA) 24 hr tablet 9 mg  9 mg Oral QHS Deshannon Hinchliffe, [name], MD   9 mg at 08/04/19 2124  . traZODone (DESYREL) tablet 50 mg  50 mg Oral QHS PRN [name], MD   50 mg at 08/03/19 2137  . ziprasidone (GEODON) injection 20 mg  20 mg Intramuscular Q6H PRN Sharma Covert, MD   20 mg at 08/05/19 1507    Lab Results: No results found for  this or any previous visit (from the past 48 hour(s)).  Blood Alcohol level:  Lab Results  Component Value Date   ETH <10 07/09/2019   ETH <10 07/08/2019  Metabolic Disorder Labs: Lab Results  Component Value Date   HGBA1C 5.2 05/17/2019   MPG 102.54 05/17/2019   MPG 108.28 02/17/2019   Lab Results  Component Value Date   PROLACTIN 31.9 (H) 02/17/2019   PROLACTIN 29.6 (H) 09/25/2016   Lab Results  Component Value Date   CHOL 144 05/17/2019   TRIG 65 05/17/2019   HDL 35 (L) 05/17/2019   CHOLHDL 4.1 05/17/2019   VLDL 13 05/17/2019   LDLCALC 96 05/17/2019   LDLCALC 76 02/17/2019    Physical Findings: AIMS:  , ,  ,  ,    CIWA:    COWS:     Musculoskeletal: Strength & Muscle Tone: within normal limits Gait & Station: normal Patient leans: N/A  Psychiatric Specialty Exam: Physical Exam  Nursing note and vitals reviewed. Constitutional: She appears well-developed and well-nourished.  HENT:  Head: Normocephalic and atraumatic.  Eyes: Pupils are equal, round, and reactive to light. Conjunctivae are normal.  Cardiovascular: Regular rhythm and normal heart sounds.  Respiratory: Effort normal.  GI: Soft.  Musculoskeletal:        General: Normal range of motion.     Cervical back: Normal range of motion.  Neurological: She is alert.  Skin: Skin is warm and dry.  Psychiatric: Her affect is labile. Her speech is delayed. She is agitated and aggressive. Thought content is delusional. Cognition and memory are impaired. She expresses inappropriate judgment.    Review of Systems  Constitutional: Negative.   HENT: Negative.   Eyes: Negative.   Respiratory: Negative.   Cardiovascular: Negative.   Gastrointestinal: Negative.   Musculoskeletal: Negative.   Skin: Negative.   Neurological: Negative.   Psychiatric/Behavioral: Positive for dysphoric mood and hallucinations.    Blood pressure 113/60, pulse 93, temperature 97.6 F (36.4 C), temperature source Oral, resp.  rate 17, height 5\' 4"  (1.626 m), weight 67.2 kg, SpO2 97 %.Body mass index is 25.43 kg/m.  General Appearance: Casual  Eye Contact:  Minimal  Speech:  Slow  Volume:  Decreased  Mood:  Anxious and Euthymic  Affect:  Inappropriate and Labile  Thought Process:  Disorganized  Orientation:  Negative  Thought Content:  Illogical, Hallucinations: Auditory and Paranoid Ideation  Suicidal Thoughts:  No  Homicidal Thoughts:  No  Memory:  Immediate;   Fair Recent;   Poor Remote;   Poor  Judgement:  Poor  Insight:  Lacking  Psychomotor Activity:  Decreased  Concentration:  Concentration: Poor  Recall:  Poor  Fund of Knowledge:  Poor  Language:  Poor  Akathisia:  No  Handed:  Right  AIMS (if indicated):     Assets:  Desire for Improvement  ADL's:  Impaired  Cognition:  Impaired,  Mild  Sleep:  Number of Hours: 8.15     Treatment Plan Summary: Daily contact with patient to assess and evaluate symptoms and progress in treatment, Medication management and Plan As noted above for the most part she has been doing okay the last few days although the things that she says makes 1 suggest that she is probably still hallucinating.  Still talks a lot about her boyfriend "Darnelle Maffucci" who no one has been able to identify yet.  This afternoon seems to going off explosively.  Behavior suggest still having hallucinations.  Low frustration tolerance also may support the possibility of some chronic intellectual disability.  Patient was able to soothe her self but has been moved onto the back hallway for now.  No acute change to medicine she  already has as needed's available.  Alethia Berthold, MD 08/05/2019, 3:40 PM

## 2019-08-05 NOTE — BHH Group Notes (Signed)
LCSW Group Therapy Note  08/05/2019 1:00 PM  Type of Therapy/Topic:  Group Therapy:  Balance in Life  Participation Level:  Did Not Attend  Description of Group:    This group will address the concept of balance and how it feels and looks when one is unbalanced. Patients will be encouraged to process areas in their lives that are out of balance and identify reasons for remaining unbalanced. Facilitators will guide patients in utilizing problem-solving interventions to address and correct the stressor making their life unbalanced. Understanding and applying boundaries will be explored and addressed for obtaining and maintaining a balanced life. Patients will be encouraged to explore ways to assertively make their unbalanced needs known to significant others in their lives, using other group members and facilitator for support and feedback.  Therapeutic Goals: 1. Patient will identify two or more emotions or situations they have that consume much of in their lives. 2. Patient will identify signs/triggers that life has become out of balance:  3. Patient will identify two ways to set boundaries in order to achieve balance in their lives:  4. Patient will demonstrate ability to communicate their needs through discussion and/or role plays  Summary of Patient Progress: X  Therapeutic Modalities:   Cognitive Behavioral Therapy Solution-Focused Therapy Assertiveness Training  [name] MSW, LCSW 08/05/2019 2:12 PM

## 2019-08-05 NOTE — Progress Notes (Signed)
CSW attempted to contact Virgil Benedict with DSS guardianship services and left a voicemail requesting a call back.   Evalina Field, MSW, LCSW Clinical Social Work 08/05/2019 1:27 PM

## 2019-08-05 NOTE — BHH Counselor (Signed)
CSW attempted to contact [name] [phone] with [place] to discuss guardianship, however, was unable to speak with someone and left a HIPAA compliant voicemail.  [name], MSW, LCSW 08/05/2019 2:26 PM

## 2019-08-05 NOTE — Progress Notes (Signed)
D: Psychosis  A: Periods of yelling aloud this shift ,  Able to say she was agitated . Patient moved to back hall . Continue to voice of someone out to get her .  Voice  Of Someone  Putting  a hit on her  For 5 million dollars .  Limited interaction  With peer and staff.  Continue to voice  People out to get her.   Aware of medication received.Patient aware of Julia [name] education , able to verbalize understandingwith redirection from staff. Emotional and mental health status improving . Unable  to maintain frustration no anger management concerns encourage to continue to work on these issues. V oice of no safety concerns . Working on Radiographer, therapeutic. Encourage patient participation with unit programming . Instruction  Given on  Medication , verbalize understanding.  R: Voice no other concerns. Staff continue to monitor

## 2019-08-05 NOTE — Progress Notes (Signed)
CSW contacted Suanne Marker with financial counseling to follow up on status of pt application. CSW had to leave a voicemail requesting a call back.   Evalina Field, MSW, LCSW Clinical Social Work 08/05/2019 11:30 AM

## 2019-08-05 NOTE — Progress Notes (Signed)
Recreation Therapy Notes   Date: 08/05/2019  Time: 9:30 am   Location: Craft room   Behavioral response: N/A   Intervention Topic: Relaxation   Discussion/Intervention: Patient did not attend group.   Clinical Observations/Feedback:  Patient did not attend group.   Braeleigh Pyper LRT/CTRS        Ijeoma Loor 08/05/2019 11:45 AM

## 2019-08-06 NOTE — BHH Group Notes (Signed)
LCSW Group Therapy Note  08/06/2019 1:57 PM  Type of Therapy and Topic:  Group Therapy:  Feelings around Relapse and Recovery  Participation Level:  Did Not Attend   Description of Group:    Patients in this group will discuss emotions they experience before and after a relapse. They will process how experiencing these feelings, or avoidance of experiencing them, relates to having a relapse. Facilitator will guide patients to explore emotions they have related to recovery. Patients will be encouraged to process which emotions are more powerful. They will be guided to discuss the emotional reaction significant others in their lives may have to their relapse or recovery. Patients will be assisted in exploring ways to respond to the emotions of others without this contributing to a relapse.  Therapeutic Goals: 1. Patient will identify two or more emotions that lead to a relapse for them 2. Patient will identify two emotions that result when they relapse 3. Patient will identify two emotions related to recovery 4. Patient will demonstrate ability to communicate their needs through discussion and/or role plays   Summary of Patient Progress: x    Therapeutic Modalities:   Cognitive Behavioral Therapy Solution-Focused Therapy Assertiveness Training Relapse Prevention Therapy   Evalina Field, MSW, LCSW Clinical Social Work 08/06/2019 1:57 PM

## 2019-08-06 NOTE — Progress Notes (Signed)
Patient was in her room upon arrival to the unit.Patient.Patient denies SI/HI/AVH, anxiety and pain.Patient was asleep during med pass. Patient acted out earlier in the day and was given PRN medications. Patient given education. Patient given support and encouragement to be active in her treatment plan.Patient being monitored Q 15 minutes for safety per unit protocol. Patient remains safe on the unit.Patient observed interacting appropriately with staff and peers on the unit.

## 2019-08-06 NOTE — Plan of Care (Signed)
Patient still presenting the same as she has been   Problem: Self-Concept: Goal: Level of anxiety will decrease Outcome: Not Progressing

## 2019-08-06 NOTE — Progress Notes (Signed)
CSW spoke with Suanne Marker with financial counseling who reported she submitted pts application for medicaid to [facility] on 08/04/2019 and they have 45 days to make a decision regarding pts application.   Evalina Field, MSW, LCSW Clinical Social Work 08/06/2019 9:43 AM'

## 2019-08-06 NOTE — Progress Notes (Signed)
CSW called [facility] and left a voicemail requesting call back regarding guardianship status of pt.  Evalina Field, MSW, LCSW Clinical Social Work 08/06/2019 10:04 AM

## 2019-08-06 NOTE — BHH Counselor (Signed)
CSW called Chervonne with [facility], the pt's care coordinator.   CSW updated on conversation with [name] at [facility].  CSW requested that they send any medical records to [facility] and provided the contact information for [name].  She reports that she will and requested CSW's assistance in completing a consent form from patient.  [name], MSW, LCSW 08/06/2019 2:46 PM
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
## 2019-08-06 NOTE — Progress Notes (Signed)
[facility] MD Progress Note  08/06/2019 4:07 PM Julia [name]  MRN:  [id number] Subjective: Follow-up for this patient with psychosis.  Patient was in her room calm today.  No new complaints.  Continues to endorse occasional hallucinations.  Able to interact in groups sometimes but not consistently.  Taking care of her hygiene adequately.  Unable to engage in any kind of reasonable conversation about outpatient treatment or self-care. Principal Problem: Psychosis ([place]) Diagnosis: Principal Problem:   Psychosis ([place]) Active Problems:   Polysubstance abuse ([place])   Depressed mood   Substance induced mood disorder ([place])  Total Time spent with patient: 30 minutes  Past Psychiatric History: History of several prior admissions with developing psychotic disorder and history of substance abuse  Past Medical History:  Past Medical History:  Diagnosis Date  . Burning with urination 05/03/2015  . Contraceptive management 05/03/2015  . Depression   . Dysmenorrhea 12/30/2013  . Heroin addiction ([place])   . Menorrhagia 12/30/2013  . Menstrual extraction 12/30/2013  . Migraines   . Social anxiety disorder 09/25/2016  . Suicidal ideations   . Vaginal odor 05/03/2015    Past Surgical History:  Procedure Laterality Date  . NO PAST SURGERIES     Family History:  Family History  Problem Relation Age of Onset  . Depression Mother   . Hypertension Father   . Hyperlipidemia Father   . Cancer Paternal Grandmother        breast, uterine  . Cirrhosis Paternal Grandfather        due to alcohol   Family Psychiatric  History: See previous Social History:  Social History   Substance and Sexual Activity  Alcohol Use Yes   Comment: BAC was clear     Social History   Substance and Sexual Activity  Drug Use Yes  . Types: Marijuana, Oxycodone   Comment: reports oxycodone and marijuana    Social History   Socioeconomic History  . Marital status: Single    Spouse name: Not on file  . Number of  children: Not on file  . Years of education: Not on file  . Highest education level: Not on file  Occupational History  . Occupation: Unemployed  Tobacco Use  . Smoking status: Current Every Day Smoker    Packs/day: 0.50    Types: Cigarettes  . Smokeless tobacco: Never Used  . Tobacco comment: Not ready to quit  Substance and Sexual Activity  . Alcohol use: Yes    Comment: BAC was clear  . Drug use: Yes    Types: Marijuana, Oxycodone    Comment: reports oxycodone and marijuana  . Sexual activity: Yes    Birth control/protection: Implant  Other Topics Concern  . Not on file  Social History Narrative   06/23/2019:  Pt stated that she is homeless, that she is a high school graduate, and that she is unemployed and not followed by any outpatient provider.      Lives with Dad. Mom has LGD, lives in [place]. 11th grader. Dog.   Social Determinants of Health   Financial Resource Strain:   . Difficulty of Paying Living Expenses: Not on file  Food Insecurity:   . Worried About Charity fundraiser in the Last Year: Not on file  . Ran Out of Food in the Last Year: Not on file  Transportation Needs:   . Lack of Transportation (Medical): Not on file  . Lack of Transportation (Non-Medical): Not on file  Physical Activity:   .  Days of Exercise per Week: Not on file  . Minutes of Exercise per Session: Not on file  Stress:   . Feeling of Stress : Not on file  Social Connections:   . Frequency of Communication with Friends and Family: Not on file  . Frequency of Social Gatherings with Friends and Family: Not on file  . Attends Religious Services: Not on file  . Active Member of Clubs or Organizations: Not on file  . Attends Archivist Meetings: Not on file  . Marital Status: Not on file   Additional Social History:                         Sleep: Fair  Appetite:  Fair  Current Medications: Current Facility-Administered Medications  Medication Dose Route Frequency  Provider Last Rate Last Admin  . acetaminophen (TYLENOL) tablet 650 mg  650 mg Oral Q6H PRN [name], MD   650 mg at 07/29/19 1732  . alum & mag hydroxide-simeth (MAALOX/MYLANTA) 200-200-20 MG/5ML suspension 30 mL  30 mL Oral Q4H PRN [name], MD      . benztropine (COGENTIN) tablet 2 mg  2 mg Oral BID PRN [name], MD   2 mg at 08/03/19 1902   Or  . benztropine mesylate (COGENTIN) injection 2 mg  2 mg Intramuscular BID PRN [name], MD      . diphenhydrAMINE (BENADRYL) capsule 50 mg  50 mg Oral Q6H PRN [name], MD   50 mg at 08/05/19 1716   Or  . diphenhydrAMINE (BENADRYL) injection 50 mg  50 mg Intramuscular Q6H PRN [name], MD   50 mg at 08/03/19 1902  . FLUoxetine (PROZAC) capsule 40 mg  40 mg Oral Daily [name], MD   40 mg at 08/06/19 0827  . haloperidol (HALDOL) tablet 10 mg  10 mg Oral Q6H PRN Sharma Covert, MD   10 mg at 08/05/19 1851   Or  . haloperidol lactate (HALDOL) injection 10 mg  10 mg Intramuscular Q6H PRN Sharma Covert, MD   10 mg at 07/23/19 2052  . ibuprofen (ADVIL) tablet 600 mg  600 mg Oral Q6H PRN Daishaun Ayre T, MD   600 mg at 08/01/19 1120  . magnesium hydroxide (MILK OF MAGNESIA) suspension 30 mL  30 mL Oral Daily PRN [name], MD      . paliperidone (INVEGA) 24 hr tablet 9 mg  9 mg Oral QHS Juda Toepfer, [name], MD   9 mg at 08/04/19 2124  . traZODone (DESYREL) tablet 50 mg  50 mg Oral QHS PRN [name], MD   50 mg at 08/03/19 2137  . ziprasidone (GEODON) injection 20 mg  20 mg Intramuscular Q6H PRN Sharma Covert, MD   20 mg at 08/05/19 1507    Lab Results: No results found for this or any previous visit (from the past 48 hour(s)).  Blood Alcohol level:  Lab Results  Component Value Date   [facility] <10 07/09/2019   ETH <10 [id number]    Metabolic Disorder Labs: Lab Results  Component Value Date   HGBA1C 5.2 05/17/2019   MPG 102.54 05/17/2019   MPG 108.28 02/17/2019   Lab  Results  Component Value Date   PROLACTIN 31.9 (H) 02/17/2019   PROLACTIN 29.6 (H) 09/25/2016   Lab Results  Component Value Date   CHOL 144 05/17/2019   TRIG 65 05/17/2019  HDL 35 (L) 05/17/2019   CHOLHDL 4.1 05/17/2019   VLDL 13 05/17/2019   LDLCALC 96 05/17/2019   LDLCALC 76 02/17/2019    Physical Findings: AIMS:  , ,  ,  ,    CIWA:    COWS:     Musculoskeletal: Strength & Muscle Tone: within normal limits Gait & Station: normal Patient leans: N/A  Psychiatric Specialty Exam: Physical Exam  Nursing note and vitals reviewed. Constitutional: She appears well-developed and well-nourished.  HENT:  Head: Normocephalic and atraumatic.  Eyes: Pupils are equal, round, and reactive to light. Conjunctivae are normal.  Cardiovascular: Regular rhythm and normal heart sounds.  Respiratory: Effort normal. No respiratory distress.  GI: Soft.  Musculoskeletal:        General: Normal range of motion.     Cervical back: Normal range of motion.  Neurological: She is alert.  Skin: Skin is warm and dry.  Psychiatric: Her affect is blunt. Her speech is tangential. She is agitated. She is not aggressive. Thought content is paranoid and delusional. Cognition and memory are impaired. She expresses impulsivity.    Review of Systems  Constitutional: Negative.   HENT: Negative.   Eyes: Negative.   Respiratory: Negative.   Cardiovascular: Negative.   Gastrointestinal: Negative.   Musculoskeletal: Negative.   Skin: Negative.   Neurological: Negative.   Psychiatric/Behavioral: Positive for dysphoric mood. The patient is nervous/anxious.     Blood pressure 92/70, pulse 93, temperature 98.2 F (36.8 C), temperature source Oral, resp. rate 18, height 5\' 4"  (1.626 m), weight 67.2 kg, SpO2 96 %.Body mass index is 25.43 kg/m.  General Appearance: Casual  Eye Contact:  Fair  Speech:  Slow  Volume:  Decreased  Mood:  Dysphoric  Affect:  Flat  Thought Process:  Disorganized  Orientation:   Full (Time, Place, and Person)  Thought Content:  Illogical and Paranoid Ideation  Suicidal Thoughts:  No  Homicidal Thoughts:  No  Memory:  Immediate;   Fair Recent;   Poor Remote;   Fair  Judgement:  Impaired  Insight:  Shallow  Psychomotor Activity:  Normal  Concentration:  Concentration: Poor  Recall:  [place] of Knowledge:  Poor  Language:  Fair  Akathisia:  No  Handed:  Right  AIMS (if indicated):     Assets:  Desire for Improvement Physical Health  ADL's:  Impaired  Cognition:  Impaired,  Mild  Sleep:  Number of Hours: 8.5     Treatment Plan Summary: Daily contact with patient to assess and evaluate symptoms and progress in treatment, Medication management and Plan Stable today.  No change to medicine.  Working with the treatment team on seeing if we can find a way to get acute guardianship.  Alethia Berthold, MD 08/06/2019, 4:07 PM

## 2019-08-06 NOTE — Progress Notes (Signed)
Recreation Therapy Notes   Date: 08/06/2019  Time: 9:30 am   Location: Craft room   Behavioral response: N/A   Intervention Topic: Stress  Discussion/Intervention: Patient did not attend group.   Clinical Observations/Feedback:  Patient did not attend group.   Julia [name] LRT/CTRS        [name] 08/06/2019 11:16 AM

## 2019-08-06 NOTE — BHH Counselor (Signed)
CSW was able to get ahold of [name] at [facility]  [phone] to discuss where they are in deciding if patient was in need of guardianship.  CSW notes that conversation appeared to be contentious at times as evidence by DSS's tone.  [name] reports that DSS has "60 days to respond".  She reports that "if you all would like for it be sooner than you all can complete an ex-parte, otherwise she will have to be there until those 90 days are up".  [name] reports that the following is needed from the hospital, the patient's medical entire medical record and a letter from the psychiatrist stating why the patient needs a guardian.  CSW obtained that letter should be faxed to [facility] attention at [phone].  [name] had CSW provide background information on patient and why patient is in need of a guardian.  CSW did so.    CSW asked for clarification on when the "60 days" started and was informed "from the day the referral was made to APS".  CSW notes that APS report was made on 07/27/2019.    [name] reports that DSS will have to speak to every member of the pt's family member to see if they would like to be the patient's guardian and do a people search and "track communications".  She reports that [facility] will "do the evaluation but recommend that [facility] takes the case".    She did follow up on placement for patient and CSW reviewed the lack of financial resources for patient and shelters have been at capacity. CSW informed that application for Medicaid has been submitted.  Per [name] "she won't qualify with out a payor source".   [name], MSW, LCSW 08/06/2019 2:43 PM

## 2019-08-07 DIAGNOSIS — F1910 Other psychoactive substance abuse, uncomplicated: Secondary | ICD-10-CM

## 2019-08-07 DIAGNOSIS — F29 Unspecified psychosis not due to a substance or known physiological condition: Secondary | ICD-10-CM

## 2019-08-07 DIAGNOSIS — F1994 Other psychoactive substance use, unspecified with psychoactive substance-induced mood disorder: Secondary | ICD-10-CM

## 2019-08-07 DIAGNOSIS — R4589 Other symptoms and signs involving emotional state: Secondary | ICD-10-CM

## 2019-08-07 MED ORDER — HALOPERIDOL 5 MG PO TABS
5.0000 mg | ORAL_TABLET | Freq: Every day | ORAL | Status: DC
  Administered 2019-08-07: 5 mg via ORAL
  Filled 2019-08-07: qty 1

## 2019-08-07 MED ORDER — PALIPERIDONE ER 3 MG PO TB24
3.0000 mg | ORAL_TABLET | Freq: Every day | ORAL | Status: DC
  Administered 2019-08-07 – 2019-08-18 (×12): 3 mg via ORAL
  Filled 2019-08-07 (×12): qty 1
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
## 2019-08-07 NOTE — Plan of Care (Signed)
  Problem: Activity: Goal: Imbalance in normal sleep/wake cycle will improve Outcome: Progressing   Problem: Safety: Goal: Ability to disclose and discuss suicidal ideas will improve Outcome: Progressing   Problem: Education: Goal: Utilization of techniques to improve thought processes will improve Outcome: Not Progressing Goal: Knowledge of the prescribed therapeutic regimen will improve Outcome: Not Progressing   Problem: Activity: Goal: Interest or engagement in leisure activities will improve Outcome: Not Progressing   Problem: Coping: Goal: Coping ability will improve Outcome: Not Progressing   Problem: Health Behavior/Discharge Planning: Goal: Ability to make decisions will improve Outcome: Not Progressing

## 2019-08-07 NOTE — Plan of Care (Signed)
Patient presents the same as she has the past week with this Probation officer   Problem: Health Behavior/Discharge Planning: Goal: Compliance with therapeutic regimen will improve Outcome: Not Progressing

## 2019-08-07 NOTE — Progress Notes (Signed)
Patient was in her room upon arrival to the unit.Patient.Patient denies SI/HI/AVH, anxiety and pain.Patient compliant with medication administration per MD orders. Patient given education. Patient given support and encouragement to be active in her treatment plan.Patient being monitored Q 15 minutes for safety per unit protocol. Patient remains safe on the unit.Patient observed interacting appropriately with staff and peers on the unit.
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
## 2019-08-07 NOTE — BHH Group Notes (Signed)
[facility] LCSW Group Therapy Note   Date and Time: 08/07/2019 1:00pm  Type of Group and Topic: Psychoeducational Group: Discharge Planning and Individual Wellness  Participation Level: [facility] PARTICIPATION LEVEL: Did Not Attend    Description of Group: Discharge planning group reviews patient's anticipated discharge plans and assists patients to anticipate and address any barriers to wellness/recovery in the community. Suicide prevention education is reviewed with patients in group. Therapeutic Goals 1. Patients will state their anticipated discharge plan and mental health aftercare 2. Patients will identify potential barriers to wellness in the community setting 3. Patients will engage in problem solving, solution focused discussion of ways to anticipate and address barriers to wellness/recovery     Summary of Patient Progress:  X  Plan for Discharge/Comments:    Transportation Means:   Supports:     Therapeutic Modalities: Motivational Interviewing and Psycho-Education  Juanetta Beets, MSW, [place] Social Worker

## 2019-08-07 NOTE — Tx Team (Signed)
Interdisciplinary Treatment and Diagnostic Plan Update  08/07/2019 Time of Session: 8:30am Julia [name] MRN: [id number]  Principal Diagnosis: Psychosis [facility])  Secondary Diagnoses: Principal Problem:   Psychosis ([place]) Active Problems:   Polysubstance abuse ([place])   Depressed mood   Substance induced mood disorder ([place])   Current Medications:  Current Facility-Administered Medications  Medication Dose Route Frequency Provider Last Rate Last Admin  . acetaminophen (TYLENOL) tablet 650 mg  650 mg Oral Q6H PRN [name], MD   650 mg at 07/29/19 1732  . alum & mag hydroxide-simeth (MAALOX/MYLANTA) 200-200-20 MG/5ML suspension 30 mL  30 mL Oral Q4H PRN [name], MD      . benztropine (COGENTIN) tablet 2 mg  2 mg Oral BID PRN [name], MD   2 mg at 08/03/19 1902   Or  . benztropine mesylate (COGENTIN) injection 2 mg  2 mg Intramuscular BID PRN [name], MD      . diphenhydrAMINE (BENADRYL) capsule 50 mg  50 mg Oral Q6H PRN [name], MD   50 mg at 08/06/19 1639   Or  . diphenhydrAMINE (BENADRYL) injection 50 mg  50 mg Intramuscular Q6H PRN [name], MD   50 mg at 08/03/19 1902  . FLUoxetine (PROZAC) capsule 40 mg  40 mg Oral Daily [name], MD   40 mg at 08/07/19 0818  . haloperidol (HALDOL) tablet 10 mg  10 mg Oral Q6H PRN Sharma Covert, MD   10 mg at 08/06/19 1639   Or  . haloperidol lactate (HALDOL) injection 10 mg  10 mg Intramuscular Q6H PRN Sharma Covert, MD   10 mg at 07/23/19 2052  . ibuprofen (ADVIL) tablet 600 mg  600 mg Oral Q6H PRN [name], John T, MD   600 mg at 08/01/19 1120  . magnesium hydroxide (MILK OF MAGNESIA) suspension 30 mL  30 mL Oral Daily PRN [name], MD      . paliperidone (INVEGA) 24 hr tablet 9 mg  9 mg Oral QHS [name], [name], MD   9 mg at 08/06/19 2208  . traZODone (DESYREL) tablet 50 mg  50 mg Oral QHS PRN [name], MD   50 mg at 08/03/19 2137  . ziprasidone (GEODON) injection  20 mg  20 mg Intramuscular Q6H PRN Sharma Covert, MD   20 mg at 08/05/19 1507   PTA Medications: Medications Prior to Admission  Medication Sig Dispense Refill Last Dose  . etonogestrel (NEXPLANON) 68 MG IMPL implant 1 each (68 mg total) by Subdermal route once for 1 dose. (Birth control method) 1 each 0   . FLUoxetine (PROZAC) 10 MG capsule Take 3 capsules (30 mg total) by mouth daily. 90 capsule 1   . hydrOXYzine (ATARAX/VISTARIL) 25 MG tablet Take 1 tablet (25 mg total) by mouth every 6 (six) hours as needed for anxiety. 60 tablet 1   . QUEtiapine (SEROQUEL) 50 MG tablet Take 1 tablet (50 mg total) by mouth daily. 30 tablet 1   . traZODone (DESYREL) 50 MG tablet Take 50 mg by mouth at bedtime as needed. for sleep       Patient Stressors: Financial difficulties Loss of Mother Marital or family conflict Substance abuse  Patient Strengths: Ability for insight Communication skills  Treatment Modalities: Medication Management, Group therapy, Case management,  1 to 1 session with clinician, Psychoeducation, Recreational therapy.   Physician Treatment Plan for Primary Diagnosis: Psychosis [facility]) Long Term Goal(s):  Improvement in symptoms so as ready for discharge Improvement in symptoms so as ready for discharge   Short Term Goals: Ability to verbalize feelings will improve Ability to disclose and discuss suicidal ideas Ability to demonstrate self-control will improve Ability to identify and develop effective coping behaviors will improve Compliance with prescribed medications will improve  Medication Management: Evaluate patient's response, side effects, and tolerance of medication regimen.  Therapeutic Interventions: 1 to 1 sessions, Unit Group sessions and Medication administration.  Evaluation of Outcomes: Not Progressing  Physician Treatment Plan for Secondary Diagnosis: Principal Problem:   Psychosis ([place]) Active Problems:   Polysubstance abuse ([place])   Depressed  mood   Substance induced mood disorder ([place])  Long Term Goal(s): Improvement in symptoms so as ready for discharge Improvement in symptoms so as ready for discharge   Short Term Goals: Ability to verbalize feelings will improve Ability to disclose and discuss suicidal ideas Ability to demonstrate self-control will improve Ability to identify and develop effective coping behaviors will improve Compliance with prescribed medications will improve     Medication Management: Evaluate patient's response, side effects, and tolerance of medication regimen.  Therapeutic Interventions: 1 to 1 sessions, Unit Group sessions and Medication administration.  Evaluation of Outcomes: Not Progressing   RN Treatment Plan for Primary Diagnosis: Psychosis ([place]) Long Term Goal(s): Knowledge of disease and therapeutic regimen to maintain health will improve  Short Term Goals: Ability to participate in decision making will improve, Ability to verbalize feelings will improve, Ability to disclose and discuss suicidal ideas, Ability to identify and develop effective coping behaviors will improve and Compliance with prescribed medications will improve  Medication Management: RN will administer medications as ordered by provider, will assess and evaluate patient's response and provide education to patient for prescribed medication. RN will report any adverse and/or side effects to prescribing provider.  Therapeutic Interventions: 1 on 1 counseling sessions, Psychoeducation, Medication administration, Evaluate responses to treatment, Monitor vital signs and CBGs as ordered, Perform/monitor CIWA, COWS, AIMS and Fall Risk screenings as ordered, Perform wound care treatments as ordered.  Evaluation of Outcomes: Not Progressing   LCSW Treatment Plan for Primary Diagnosis: Psychosis ([place]) Long Term Goal(s): Safe transition to appropriate next level of care at discharge, Engage patient in therapeutic group addressing  interpersonal concerns.  Short Term Goals: Engage patient in aftercare planning with referrals and resources and Increase skills for wellness and recovery  Therapeutic Interventions: Assess for all discharge needs, 1 to 1 time with Social worker, Explore available resources and support systems, Assess for adequacy in community support network, Educate family and significant other(s) on suicide prevention, Complete Psychosocial Assessment, Interpersonal group therapy.  Evaluation of Outcomes: Not Progressing   Progress in Treatment: Attending groups: No. Participating in groups: No. Taking medication as prescribed: Yes. Toleration medication: Yes. Family/Significant other contact made: No, will contact:  pt declined spe; with pt Patient understands diagnosis: Yes. Discussing patient identified problems/goals with staff: Yes. Medical problems stabilized or resolved: Yes. Denies suicidal/homicidal ideation: Yes. Issues/concerns per patient self-inventory: No. Other:   New problem(s) identified: No, Describe:  None  New Short Term/Long Term Goal(s): Medication stabilization, elimination of SI thoughts, and development of a comprehensive mental wellness plan.   Patient Goals:    "to have a plan"  Update 07/27/2019: No changes at this time. Update 07/31/2019: No changes at this time   Discharge Plan or Barriers: Patient is currently homeless.  Patient aftercare depends on where patient will be staying. CSW is attempting to  identify an available bed at local homeless shelters.  Patient reports that she can not return to [facility]. Update 07/22/19-Pt displaying psychotic behavior. No other social supports identified. CSW's attempted to contact pt boyfriend and father. Awaiting response from boyfriend and pt father lives in a nursing home.  Discharge plan TBD Update 07/27/2019:  Patient continues to adjust to her medications. She continues to have some delusions in regards of being a part  of witness protection and having the police locate housing for her. Patient is homeless with no supports from home to assist with housing.  Application for [employer] in [place] has been sent.  No word yet on possible housing.  Patient can not stay with father, return to [facility] and calls to her boyfriend have not been returned. Update 07/31/2019: CSW messaged Ana Leamon at [facility] about obtaining medicaid for the patient and helping her through the process of receiving medicaid so that patient can have more opportunities for aftercare and services.   Reason for Continuation of Hospitalization: Delusions  Medication stabilization  Estimated Length of Stay: 3-5 days   Attendees: Patient:  08/07/2019  Physician: Dr. Mallie Darting,  MD 08/07/2019   Nursing: Alyson Locket, RN 08/07/2019   RN Care Manager: 08/07/2019   Social Worker: Juanetta Beets, [place] 08/07/2019   Recreational Therapist:  08/07/2019   Other: [name], LCSW 08/07/2019   Other:  08/07/2019   Other: 08/07/2019       Scribe for Treatment Team: Charlott Rakes, [place] 08/07/2019 9:44 AM

## 2019-08-07 NOTE — Plan of Care (Signed)
Patient has been in bed. Medications were taken to her in room. Pleasant upon approach.Cooperative during assessment. Denied thoughts of self harm. Denied hallucinations. Received medication and went to the dayroom to get a snack. No major concern. Support and encouragements provided. Safety monitored as expected.

## 2019-08-08 DIAGNOSIS — Z5181 Encounter for therapeutic drug level monitoring: Secondary | ICD-10-CM

## 2019-08-08 MED ORDER — HALOPERIDOL 5 MG PO TABS
10.0000 mg | ORAL_TABLET | Freq: Every day | ORAL | Status: DC
  Administered 2019-08-08 – 2019-08-13 (×6): 10 mg via ORAL
  Filled 2019-08-08 (×6): qty 2

## 2019-08-08 NOTE — Progress Notes (Signed)
Patient's HR is 140. This Probation officer notified MD.

## 2019-08-08 NOTE — Plan of Care (Signed)
D- Patient alert and oriented. Patient presents in a sad/depressed mood on assessment stating that she slept ok last night and had no complaints or concerns to voice to this Probation officer. Patient denies anxiety, however, endorsed depression, stating "this homeless situation" is making her feel this way. Patient also denies SI, HI, AVH, and pain at this time. Patient has no stated goals for today.  A- Scheduled medications administered to patient, per MD orders. Support and encouragement provided.  Routine safety checks conducted every 15 minutes.  Patient informed to notify staff with problems or concerns.  R- No adverse drug reactions noted. Patient contracts for safety at this time. Patient compliant with medications and treatment plan. Patient receptive, calm, and cooperative. Patient interacts well with others on the unit.  Patient remains safe at this time.  Problem: Education: Goal: Utilization of techniques to improve thought processes will improve Outcome: Progressing Goal: Knowledge of the prescribed therapeutic regimen will improve Outcome: Progressing   Problem: Activity: Goal: Interest or engagement in leisure activities will improve Outcome: Progressing Goal: Imbalance in normal sleep/wake cycle will improve Outcome: Progressing   Problem: Coping: Goal: Coping ability will improve Outcome: Progressing Goal: Will verbalize feelings Outcome: Progressing   Problem: Health Behavior/Discharge Planning: Goal: Ability to make decisions will improve Outcome: Progressing Goal: Compliance with therapeutic regimen will improve Outcome: Progressing   Problem: Role Relationship: Goal: Will demonstrate positive changes in social behaviors and relationships Outcome: Progressing   Problem: Safety: Goal: Ability to disclose and discuss suicidal ideas will improve Outcome: Progressing Goal: Ability to identify and utilize support systems that promote safety will improve Outcome:  Progressing   Problem: Self-Concept: Goal: Will verbalize positive feelings about self Outcome: Progressing Goal: Level of anxiety will decrease Outcome: Progressing   Problem: Education: Goal: Knowledge of the prescribed therapeutic regimen will improve Outcome: Progressing   Problem: Coping: Goal: Coping ability will improve Outcome: Progressing   Problem: Education: Goal: Knowledge of [facility] Education information/materials will improve Outcome: Progressing Goal: Emotional status will improve Outcome: Progressing
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
## 2019-08-08 NOTE — Progress Notes (Signed)
Patient just came to the nurses station and stated that she is seeing purple in her vision.

## 2019-08-08 NOTE — [facility] Group Notes (Signed)
LCSW Group Therapy Note  08/08/2019 1:15pm  Type of Therapy and Topic:  Group Therapy:  Cognitive Distortions  Participation Level:  Active   Description of Group:    Patients in this group will be introduced to the topic of cognitive distortions.  Patients will identify and describe cognitive distortions, describe the feelings these distortions create for them.  Patients will identify one or more situations in their personal life where they have cognitively distorted thinking and will verbalize challenging this cognitive distortion through positive thinking skills.  Patients will practice the skill of using positive affirmations to challenge cognitive distortions using affirmation cards.    Therapeutic Goals:  1. Patient will identify two or more cognitive distortions they have used 2. Patient will identify one or more emotions that stem from use of a cognitive distortion 3. Patient will demonstrate use of a positive affirmation to counter a cognitive distortion through discussion and/or role play. 4. Patient will describe one way cognitive distortions can be detrimental to wellness   Summary of Patient Progress: The patient reported that she feels "okay." Patients were introduced to the topic of cognitive distortions. The patient was able to identify and describe cognitive distortions, described the feelings these distortions create for him/her.  The patient shared that she uses "all or nothing thinking" often. Patient identified a situation in her personal life where she has cognitively distorted thinking/ The patient verbalized and challenged this cognitive distortion through positive thinking skills. Patient was able to provide support and validation to other group members.      Therapeutic Modalities:   Cognitive Behavioral Therapy Motivational Interviewing   Mehek Grega  CUEBAS-COLON, LCSW 08/08/2019 1:07 PM

## 2019-08-08 NOTE — Plan of Care (Signed)
Patient denies SI/HI. Patient reports auditory hallucinations of "[employer] putting hits out on me." Patient is frequently at nurses station with complaints. Patient is attention seeking, patient require frequent redirection.    Problem: Education: Goal: Utilization of techniques to improve thought processes will improve Outcome: Not Progressing Goal: Knowledge of the prescribed therapeutic regimen will improve Outcome: Not Progressing   Problem: Activity: Goal: Interest or engagement in leisure activities will improve Outcome: Not Progressing Goal: Imbalance in normal sleep/wake cycle will improve Outcome: Not Progressing

## 2019-08-09 DIAGNOSIS — F29 Unspecified psychosis not due to a substance or known physiological condition: Secondary | ICD-10-CM

## 2019-08-09 NOTE — Progress Notes (Signed)
Patient just had an outburst, banging her brush on the windowsill just smiling at one of the MHTs on the unit. This writer went down to the patient's room and asked her what was going on and she was yelling/screaming that she is not hallucinating nor making things up. Patient also stated that she is tired of being here in this facility. This Probation officer was trying to make patient understand that the social workers are trying to find placement for her. Patient was tearful and stated that she is tired of taking medicine and does not want anything that's going to make her vision blurry and if we were going to give it to her she was going to refuse it. This Probation officer administered patient PRN Benadryl to help calm her down.

## 2019-08-09 NOTE — Progress Notes (Signed)
[facility] MD Progress Note  08/09/2019 1:55 PM Julia [name]  MRN:  [id number]   Subjective: Follow-up for this 19 year old female diagnosed with psychosis.  Patient personally in the hallway and immediately stated "I think the [place] mom is trying to kill me."  Patient reports that she hears a voice that has a [place] accent and they are talking about trying to get her.  When asked why the patient states that it is due to [place] court case.  Patient then states that she knows that his mental health issue and that it is in her head.  And then she states that she knows that they are trying to get her and that she feels that it is real.  Patient waxes and wanes between stating and is real and then stating that it is a mental health issue and that they are hallucinations.  Patient denies any other complaints denies having any suicidal or homicidal ideations.  Patient reports that she has been sleeping okay and eating okay.  Patient does state that she is unsure where she is going to live that when she is discharged from the hospital at this time.  Principal Problem: Psychosis ([place]) Diagnosis: Principal Problem:   Psychosis ([place]) Active Problems:   Polysubstance abuse ([place])   Depressed mood   Substance induced mood disorder ([place])  Total Time spent with patient: 20 minutes  Past Psychiatric History: Per patient  She was ADD- diagnosed 1 month ago Record review reveals patient has had multiple hospitalizations.   Note seem to suggest that she started having substance abuse problems around age 76 or 19 years old after her mother moved out of the house. Earlier hospitalizations mostly focused on her substance abuse problems and behavior problems.  Since turning 17 she has had multiple other hospitalizations.  Substance abuse still a major issue but it seems like psychotic symptoms and mood instability have been more prominent.  She does have a history of suicide attempts.  Has had multiple medications.  Able  to recall the Abilify made her have a dystonic reaction.  Past Medical History:  Past Medical History:  Diagnosis Date  . Burning with urination 05/03/2015  . Contraceptive management 05/03/2015  . Depression   . Dysmenorrhea 12/30/2013  . Heroin addiction ([place])   . Menorrhagia 12/30/2013  . Menstrual extraction 12/30/2013  . Migraines   . Social anxiety disorder 09/25/2016  . Suicidal ideations   . Vaginal odor 05/03/2015    Past Surgical History:  Procedure Laterality Date  . NO PAST SURGERIES     Family History:  Family History  Problem Relation Age of Onset  . Depression Mother   . Hypertension Father   . Hyperlipidemia Father   . Cancer Paternal Grandmother        breast, uterine  . Cirrhosis Paternal Grandfather        due to alcohol   Family Psychiatric  History: Depression in a couple members of the family; aunt with schizophrenia Social History:  Social History   Substance and Sexual Activity  Alcohol Use Yes   Comment: BAC was clear     Social History   Substance and Sexual Activity  Drug Use Yes  . Types: Marijuana, Oxycodone   Comment: reports oxycodone and marijuana    Social History   Socioeconomic History  . Marital status: Single    Spouse name: Not on file  . Number of children: Not on file  . Years of education: Not on file  .  Highest education level: Not on file  Occupational History  . Occupation: Unemployed  Tobacco Use  . Smoking status: Current Every Day Smoker    Packs/day: 0.50    Types: Cigarettes  . Smokeless tobacco: Never Used  . Tobacco comment: Not ready to quit  Substance and Sexual Activity  . Alcohol use: Yes    Comment: BAC was clear  . Drug use: Yes    Types: Marijuana, Oxycodone    Comment: reports oxycodone and marijuana  . Sexual activity: Yes    Birth control/protection: Implant  Other Topics Concern  . Not on file  Social History Narrative   06/23/2019:  Pt stated that she is homeless, that she is a high  school graduate, and that she is unemployed and not followed by any outpatient provider.      Lives with Dad. Mom has LGD, lives in [place]. 11th grader. Dog.   Social Determinants of Health   Financial Resource Strain:   . Difficulty of Paying Living Expenses: Not on file  Food Insecurity:   . Worried About Charity fundraiser in the Last Year: Not on file  . Ran Out of Food in the Last Year: Not on file  Transportation Needs:   . Lack of Transportation (Medical): Not on file  . Lack of Transportation (Non-Medical): Not on file  Physical Activity:   . Days of Exercise per Week: Not on file  . Minutes of Exercise per Session: Not on file  Stress:   . Feeling of Stress : Not on file  Social Connections:   . Frequency of Communication with Friends and Family: Not on file  . Frequency of Social Gatherings with Friends and Family: Not on file  . Attends Religious Services: Not on file  . Active Member of Clubs or Organizations: Not on file  . Attends Archivist Meetings: Not on file  . Marital Status: Not on file   Additional Social History:                         Sleep: Good  Appetite:  Good  Current Medications: Current Facility-Administered Medications  Medication Dose Route Frequency Provider Last Rate Last Admin  . acetaminophen (TYLENOL) tablet 650 mg  650 mg Oral Q6H PRN [name], MD   650 mg at 07/29/19 1732  . alum & mag hydroxide-simeth (MAALOX/MYLANTA) 200-200-20 MG/5ML suspension 30 mL  30 mL Oral Q4H PRN [name], MD      . benztropine (COGENTIN) tablet 2 mg  2 mg Oral BID PRN [name], MD   2 mg at 08/03/19 1902   Or  . benztropine mesylate (COGENTIN) injection 2 mg  2 mg Intramuscular BID PRN [name], MD      . diphenhydrAMINE (BENADRYL) capsule 50 mg  50 mg Oral Q6H PRN [name], MD   50 mg at 08/08/19 2124   Or  . diphenhydrAMINE (BENADRYL) injection 50 mg  50 mg Intramuscular Q6H PRN [name],  MD   50 mg at 08/03/19 1902  . FLUoxetine (PROZAC) capsule 40 mg  40 mg Oral Daily [name], MD   40 mg at 08/09/19 0908  . haloperidol (HALDOL) tablet 10 mg  10 mg Oral Q6H PRN Sharma Covert, MD   10 mg at 08/07/19 1109   Or  . haloperidol lactate (HALDOL) injection 10 mg  10 mg Intramuscular Q6H PRN Myles Lipps  Kellie Simmering, MD   10 mg at 07/23/19 2052  . haloperidol (HALDOL) tablet 10 mg  10 mg Oral Q1400 Sharma Covert, MD   10 mg at 08/08/19 1412  . ibuprofen (ADVIL) tablet 600 mg  600 mg Oral Q6H PRN [name], [name], MD   600 mg at 08/01/19 1120  . magnesium hydroxide (MILK OF MAGNESIA) suspension 30 mL  30 mL Oral Daily PRN [name], MD      . paliperidone (INVEGA) 24 hr tablet 3 mg  3 mg Oral Daily Sharma Covert, MD   3 mg at 08/09/19 0908  . paliperidone (INVEGA) 24 hr tablet 9 mg  9 mg Oral QHS [name], [name], MD   9 mg at 08/08/19 2124  . traZODone (DESYREL) tablet 50 mg  50 mg Oral QHS PRN [name], MD   50 mg at 08/03/19 2137  . ziprasidone (GEODON) injection 20 mg  20 mg Intramuscular Q6H PRN Sharma Covert, MD   20 mg at 08/07/19 1207    Lab Results: No results found for this or any previous visit (from the past 48 hour(s)).  Blood Alcohol level:  Lab Results  Component Value Date   ETH <10 07/09/2019   ETH <10 [id number]    Metabolic Disorder Labs: Lab Results  Component Value Date   HGBA1C 5.2 05/17/2019   MPG 102.54 05/17/2019   MPG 108.28 02/17/2019   Lab Results  Component Value Date   PROLACTIN 31.9 (H) 02/17/2019   PROLACTIN 29.6 (H) 09/25/2016   Lab Results  Component Value Date   CHOL 144 05/17/2019   TRIG 65 05/17/2019   HDL 35 (L) 05/17/2019   CHOLHDL 4.1 05/17/2019   VLDL 13 05/17/2019   LDLCALC 96 05/17/2019   LDLCALC 76 02/17/2019    Physical Findings: AIMS:  , ,  ,  ,    CIWA:    COWS:     Musculoskeletal: Strength & Muscle Tone: within normal limits Gait & Station: normal Patient leans:  N/A  Psychiatric Specialty Exam: Physical Exam  Nursing note and vitals reviewed. Constitutional: She is oriented to person, place, and time. She appears well-developed and well-nourished.  Respiratory: Effort normal.  Musculoskeletal:        General: Normal range of motion.  Neurological: She is alert and oriented to person, place, and time.  Skin: Skin is warm.    Review of Systems  Constitutional: Negative.   HENT: Negative.   Eyes: Negative.   Respiratory: Negative.   Cardiovascular: Negative.   Gastrointestinal: Negative.   Genitourinary: Negative.   Musculoskeletal: Negative.   Skin: Negative.   Neurological: Negative.   Psychiatric/Behavioral: Positive for hallucinations.    Blood pressure 96/63, pulse (!) 101, temperature 98.9 F (37.2 C), temperature source Oral, resp. rate 18, height 5\' 4"  (1.626 m), weight 67.2 kg, SpO2 97 %.Body mass index is 25.43 kg/m.  General Appearance: Casual  Eye Contact:  Good  Speech:  Clear and Coherent and Slow  Volume:  Normal  Mood:  Anxious  Affect:  Constricted  Thought Process:  Disorganized and Descriptions of Associations: Circumstantial  Orientation:  Full (Time, Place, and Person)  Thought Content:  Hallucinations: Auditory, Paranoid Ideation, Rumination and Tangential  Suicidal Thoughts:  No  Homicidal Thoughts:  No  Memory:  Immediate;   Fair Recent;   Fair Remote;   Fair  Judgement:  Fair  Insight:  Fair  Psychomotor Activity:  Decreased  Concentration:  Concentration: Fair  Recall:  Smiley Houseman of Knowledge:  Fair  Language:  Poor  Akathisia:  No  Handed:  Right  AIMS (if indicated):     Assets:  Communication Skills Desire for Improvement Physical Health Social Support  ADL's:  Intact  Cognition:  WNL  Sleep:  Number of Hours: 8.5   Assessment: Patient is pleasant, calm, and cooperative during the evaluation.  Patient can be difficult to follow at times that she is stating that she knows that these are  voices in her head and that no one has been in her room and there is no one standing outside of her window but then is able to turn around and states that she knows that these are real and that she has a reason to be scared.  However when reality based therapy is used the patient eventually looks at me and then states "so these are auditory hallucinations that are in my head."  Patient then starts tapping on her leg and then stops immediately and states that she has no other questions and has no other complaints and gets up to walk out of the office.  Patient has not shown any signs of severe anxiety today thus far with the increased doses of medication that were started yesterday.  Treatment Plan Summary: Daily contact with patient to assess and evaluate symptoms and progress in treatment and Medication management  Continue Prozac 40 mg p.o. daily for depression and mood Continue Haldol 10 mg p.o. daily for psychosis Continue Invega 3 mg p.o. daily and 9 mg p.o. nightly for psychosis Continue trazodone 50 mg p.o. nightly as needed for insomnia Continue agitation protocol of Benadryl, Haldol every 6 hours as needed for agitation Encourage group therapy participation Continue every 15 minute safety checks  Lewis Shock, FNP 08/09/2019, 1:55 PM

## 2019-08-09 NOTE — BHH Group Notes (Signed)

## 2019-08-09 NOTE — Plan of Care (Signed)
D- Patient alert and oriented. Patient presents in a pleasant mood on assessment stating that she slept ok last night and had no complaints to voice to this Probation officer. Patient denies depression and anxiety to this writer, reporting "I feel ok". Patient also denies SI, HI, AVH, and pain at this time. Patient had no stated goals for today.  A- Scheduled medications administered to patient, per MD orders. Support and encouragement provided.  Routine safety checks conducted every 15 minutes.  Patient informed to notify staff with problems or concerns.  R- No adverse drug reactions noted. Patient contracts for safety at this time. Patient compliant with medications and treatment plan. Patient receptive, calm, and cooperative. Patient interacts well with others on the unit.  Patient remains safe at this time.  Problem: Education: Goal: Utilization of techniques to improve thought processes will improve Outcome: Progressing Goal: Knowledge of the prescribed therapeutic regimen will improve Outcome: Progressing   Problem: Activity: Goal: Interest or engagement in leisure activities will improve Outcome: Progressing Goal: Imbalance in normal sleep/wake cycle will improve Outcome: Progressing   Problem: Coping: Goal: Coping ability will improve Outcome: Progressing Goal: Will verbalize feelings Outcome: Progressing   Problem: Health Behavior/Discharge Planning: Goal: Ability to make decisions will improve Outcome: Progressing Goal: Compliance with therapeutic regimen will improve Outcome: Progressing   Problem: Role Relationship: Goal: Will demonstrate positive changes in social behaviors and relationships Outcome: Progressing   Problem: Safety: Goal: Ability to disclose and discuss suicidal ideas will improve Outcome: Progressing Goal: Ability to identify and utilize support systems that promote safety will improve Outcome: Progressing   Problem: Self-Concept: Goal: Will verbalize  positive feelings about self Outcome: Progressing Goal: Level of anxiety will decrease Outcome: Progressing   Problem: Education: Goal: Knowledge of the prescribed therapeutic regimen will improve Outcome: Progressing   Problem: Coping: Goal: Coping ability will improve Outcome: Progressing   Problem: Education: Goal: Knowledge of [place] [facility] Education information/materials will improve Outcome: Progressing Goal: Emotional status will improve Outcome: Progressing

## 2019-08-09 NOTE — Progress Notes (Signed)
Recreation Therapy Notes   Date: 08/09/2019  Time: 9:30 am   Location: Craft room   Behavioral response: N/A   Intervention Topic: Goals   Discussion/Intervention: Patient did not attend group.   Clinical Observations/Feedback:  Patient did not attend group.   Abdulwahab Demelo LRT/CTRS          Joycelin Radloff 08/09/2019 1:23 PM

## 2019-08-09 NOTE — Plan of Care (Signed)
Patient has been in bed resting. Was seen in the milieu around snack time. Patient used the phone had a snack then returned to her room. Cooperative and denying thoughts of self harm. No sign of distress. Encouragements provided. Safety precautions maintained.

## 2019-08-10 MED ORDER — TUBERCULIN PPD 5 UNIT/0.1ML ID SOLN
5.0000 [IU] | Freq: Once | INTRADERMAL | Status: AC
  Administered 2019-08-10: 5 [IU] via INTRADERMAL
  Filled 2019-08-10: qty 0.1

## 2019-08-10 NOTE — [facility] Group Notes (Signed)
LCSW Group Therapy Note  08/10/2019 1:00 PM  Type of Therapy/Topic:  Group Therapy:  Feelings about Diagnosis  Participation Level:  None   Description of Group:   This group will allow patients to explore their thoughts and feelings about diagnoses they have received. Patients will be guided to explore their level of understanding and acceptance of these diagnoses. Facilitator will encourage patients to process their thoughts and feelings about the reactions of others to their diagnosis and will guide patients in identifying ways to discuss their diagnosis with significant others in their lives. This group will be process-oriented, with patients participating in exploration of their own experiences, giving and receiving support, and processing challenge from other group members.   Therapeutic Goals: 1. Patient will demonstrate understanding of diagnosis as evidenced by identifying two or more symptoms of the disorder 2. Patient will be able to express two feelings regarding the diagnosis 3. Patient will demonstrate their ability to communicate their needs through discussion and/or role play  Summary of Patient Progress: Patient was present for group, however did not engage in group discussion.    Therapeutic Modalities:   Cognitive Behavioral Therapy Brief Therapy Feelings Identification   [name], MSW, LCSW 08/10/2019 2:08 PM

## 2019-08-10 NOTE — Progress Notes (Signed)
CSW spoke with [name] from [facility] group [facility] who reported she received the information and wanted to set up a time to talk with pt on the phone to interview her. CSW scheduled phone interview for 1:30 and was present with pt as she had her phone interview. [name] then called CSW afterwards stating she would like to meet pt and if she was able to come see pt on the unit. CSW explained that there are no visitors at the hospital at this time, but a webex could be set up so she could see pt face to face, [name] agreed to webex call to see pt.  Evalina Field, MSW, LCSW Clinical Social Work 08/10/2019 2:16 PM

## 2019-08-10 NOTE — Progress Notes (Signed)
Patient slept throughout the night. Appeared to be comfortable in bed. Currently awake and reporting that she slept well. Denying thoughts of self harm. Denies hallucinations. Patient is cooperative and has no concern. Support and encouragements provided. Safety precautions maintained.

## 2019-08-10 NOTE — Plan of Care (Signed)
Aware of  medication received. Patient aware of [place] education , able to verbalize understanding  with redirection from staff. Emotional and mental  health status  improving . Able to maintain frustration  no anger management  concerns. V oice of no safety concerns . Working on Scientific laboratory technician.    Problem: Education: Goal: Utilization of techniques to improve thought processes will improve Outcome: Progressing Goal: Knowledge of the prescribed therapeutic regimen will improve Outcome: Progressing   Problem: Activity: Goal: Interest or engagement in leisure activities will improve Outcome: Progressing Goal: Imbalance in normal sleep/wake cycle will improve Outcome: Progressing   Problem: Coping: Goal: Coping ability will improve Outcome: Progressing Goal: Will verbalize feelings Outcome: Progressing   Problem: Health Behavior/Discharge Planning: Goal: Ability to make decisions will improve Outcome: Progressing Goal: Compliance with therapeutic regimen will improve Outcome: Progressing   Problem: Role Relationship: Goal: Will demonstrate positive changes in social behaviors and relationships Outcome: Progressing   Problem: Safety: Goal: Ability to disclose and discuss suicidal ideas will improve Outcome: Progressing Goal: Ability to identify and utilize support systems that promote safety will improve Outcome: Progressing   Problem: Self-Concept: Goal: Will verbalize positive feelings about self Outcome: Progressing Goal: Level of anxiety will decrease Outcome: Progressing   Problem: Education: Goal: Knowledge of the prescribed therapeutic regimen will improve Outcome: Progressing   Problem: Coping: Goal: Coping ability will improve Outcome: Progressing   Problem: Education: Goal: Knowledge of [place] [facility] Education information/materials will improve Outcome: Progressing Goal: Emotional status will improve Outcome: Progressing

## 2019-08-10 NOTE — Progress Notes (Signed)
CSW contacted [name] [phone] regarding potential group home placement for pt. Julia [name] reported that she does currently have a bed and is willing to review pt and possibly accept her without income. Julia [name] requested the information be emailed over to [email or web address]. CSW emailed over the [facility] and H&P to Julia [name]. Julia [name] reported after she looks through the information and her QP reviews it she will contact CSW back and let her know if they will accept pt into their group home.    Julia [name], MSW, LCSW Clinical Social Work 08/10/2019 10:55 AM

## 2019-08-10 NOTE — Progress Notes (Signed)
D: Patient stated slept good last night .Stated appetite is good and energy level  Is normal. Stated concentration is good . Stated on Depression scale , hopeless and anxiety .( low 0-10 high) Denies suicidal  homicidal ideations  .  No auditory hallucinations  No pain concerns . Appropriate ADL'S. Interacting with peers and staff.  A: Encourage patient participation with unit programming . Instruction  Given on  Medication , verbalize understanding. Patient  Compliant  With  TB  And medication . Isolative  To room  R: Voice no other concerns. Staff continue to monitor

## 2019-08-10 NOTE — Progress Notes (Signed)
[facility] MD Progress Note  08/10/2019 4:11 PM Julia [name]  MRN:  [id number] Subjective: Follow-up for 19 year old woman with probable schizophrenia.  Patient seen chart reviewed.  Yesterday afternoon she had another episode of a bit of a breakdown and agitation.  Today so far she has been good with her behavior.  She is taking care of her hygiene well and has gone to some groups and interacted well.  She had a telephone interview for a group home that apparently went well today.  On interview the patient denies any current feelings of paranoia or fear.  No physical complaints. Principal Problem: Psychosis ([place]) Diagnosis: Principal Problem:   Psychosis ([place]) Active Problems:   Polysubstance abuse ([place])   Depressed mood   Substance induced mood disorder ([place])  Total Time spent with patient: 30 minutes  Past Psychiatric History: History of recurrent episodes of psychotic symptoms history of substance abuse  Past Medical History:  Past Medical History:  Diagnosis Date  . Burning with urination 05/03/2015  . Contraceptive management 05/03/2015  . Depression   . Dysmenorrhea 12/30/2013  . Heroin addiction ([place])   . Menorrhagia 12/30/2013  . Menstrual extraction 12/30/2013  . Migraines   . Social anxiety disorder 09/25/2016  . Suicidal ideations   . Vaginal odor 05/03/2015    Past Surgical History:  Procedure Laterality Date  . NO PAST SURGERIES     Family History:  Family History  Problem Relation Age of Onset  . Depression Mother   . Hypertension Father   . Hyperlipidemia Father   . Cancer Paternal Grandmother        breast, uterine  . Cirrhosis Paternal Grandfather        due to alcohol   Family Psychiatric  History: See previous Social History:  Social History   Substance and Sexual Activity  Alcohol Use Yes   Comment: BAC was clear     Social History   Substance and Sexual Activity  Drug Use Yes  . Types: Marijuana, Oxycodone   Comment: reports oxycodone and  marijuana    Social History   Socioeconomic History  . Marital status: Single    Spouse name: Not on file  . Number of children: Not on file  . Years of education: Not on file  . Highest education level: Not on file  Occupational History  . Occupation: Unemployed  Tobacco Use  . Smoking status: Current Every Day Smoker    Packs/day: 0.50    Types: Cigarettes  . Smokeless tobacco: Never Used  . Tobacco comment: Not ready to quit  Substance and Sexual Activity  . Alcohol use: Yes    Comment: BAC was clear  . Drug use: Yes    Types: Marijuana, Oxycodone    Comment: reports oxycodone and marijuana  . Sexual activity: Yes    Birth control/protection: Implant  Other Topics Concern  . Not on file  Social History Narrative   06/23/2019:  Pt stated that she is homeless, that she is a high school graduate, and that she is unemployed and not followed by any outpatient provider.      Lives with Dad. Mom has LGD, lives in [place]. 11th grader. Dog.   Social Determinants of Health   Financial Resource Strain:   . Difficulty of Paying Living Expenses: Not on file  Food Insecurity:   . Worried About Charity fundraiser in the Last Year: Not on file  . Ran Out of Food in the Last Year: Not on  file  Transportation Needs:   . Film/video editor (Medical): Not on file  . Lack of Transportation (Non-Medical): Not on file  Physical Activity:   . Days of Exercise per Week: Not on file  . Minutes of Exercise per Session: Not on file  Stress:   . Feeling of Stress : Not on file  Social Connections:   . Frequency of Communication with Friends and Family: Not on file  . Frequency of Social Gatherings with Friends and Family: Not on file  . Attends Religious Services: Not on file  . Active Member of Clubs or Organizations: Not on file  . Attends Archivist Meetings: Not on file  . Marital Status: Not on file   Additional Social History:                         Sleep:  Fair  Appetite:  Poor  Current Medications: Current Facility-Administered Medications  Medication Dose Route Frequency Provider Last Rate Last Admin  . acetaminophen (TYLENOL) tablet 650 mg  650 mg Oral Q6H PRN [name], MD   650 mg at 07/29/19 1732  . alum & mag hydroxide-simeth (MAALOX/MYLANTA) 200-200-20 MG/5ML suspension 30 mL  30 mL Oral Q4H PRN [name], MD      . benztropine (COGENTIN) tablet 2 mg  2 mg Oral BID PRN [name], MD   2 mg at 08/03/19 1902   Or  . benztropine mesylate (COGENTIN) injection 2 mg  2 mg Intramuscular BID PRN [name], MD      . diphenhydrAMINE (BENADRYL) capsule 50 mg  50 mg Oral Q6H PRN [name], MD   50 mg at 08/08/19 2124   Or  . diphenhydrAMINE (BENADRYL) injection 50 mg  50 mg Intramuscular Q6H PRN [name], MD   50 mg at 08/09/19 1515  . FLUoxetine (PROZAC) capsule 40 mg  40 mg Oral Daily [name], MD   40 mg at 08/10/19 [id number]  . haloperidol (HALDOL) tablet 10 mg  10 mg Oral Q6H PRN Sharma Covert, MD   10 mg at 08/07/19 1109   Or  . haloperidol lactate (HALDOL) injection 10 mg  10 mg Intramuscular Q6H PRN Sharma Covert, MD   10 mg at 07/23/19 2052  . haloperidol (HALDOL) tablet 10 mg  10 mg Oral Q1400 Sharma Covert, MD   10 mg at 08/10/19 1300  . ibuprofen (ADVIL) tablet 600 mg  600 mg Oral Q6H PRN Amy Gothard T, MD   600 mg at 08/01/19 1120  . magnesium hydroxide (MILK OF MAGNESIA) suspension 30 mL  30 mL Oral Daily PRN [name], MD      . paliperidone (INVEGA) 24 hr tablet 3 mg  3 mg Oral Daily Sharma Covert, MD   3 mg at 08/10/19 0829  . paliperidone (INVEGA) 24 hr tablet 9 mg  9 mg Oral QHS Taneisha Fuson, [name], MD   9 mg at 08/09/19 2202  . traZODone (DESYREL) tablet 50 mg  50 mg Oral QHS PRN [name], MD   50 mg at 08/03/19 2137  . tuberculin injection 5 Units  5 Units Intradermal Once Sargun Rummell, [name], MD   5 Units at 08/10/19 1258  . ziprasidone (GEODON)  injection 20 mg  20 mg Intramuscular Q6H PRN Sharma Covert, MD   20 mg at 08/07/19 1207    Lab Results: No results  found for this or any previous visit (from the past 48 hour(s)).  Blood Alcohol level:  Lab Results  Component Value Date   ETH <10 07/09/2019   ETH <10 [id number]    Metabolic Disorder Labs: Lab Results  Component Value Date   HGBA1C 5.2 05/17/2019   MPG 102.54 05/17/2019   MPG 108.28 02/17/2019   Lab Results  Component Value Date   PROLACTIN 31.9 (H) 02/17/2019   PROLACTIN 29.6 (H) 09/25/2016   Lab Results  Component Value Date   CHOL 144 05/17/2019   TRIG 65 05/17/2019   HDL 35 (L) 05/17/2019   CHOLHDL 4.1 05/17/2019   VLDL 13 05/17/2019   LDLCALC 96 05/17/2019   LDLCALC 76 02/17/2019    Physical Findings: AIMS:  , ,  ,  ,    CIWA:    COWS:     Musculoskeletal: Strength & Muscle Tone: within normal limits Gait & Station: normal Patient leans: N/A  Psychiatric Specialty Exam: Physical Exam  Nursing note and vitals reviewed. Constitutional: She appears well-developed and well-nourished.  HENT:  Head: Normocephalic and atraumatic.  Eyes: Pupils are equal, round, and reactive to light. Conjunctivae are normal.  Cardiovascular: Regular rhythm and normal heart sounds.  Respiratory: Effort normal.  GI: Soft.  Musculoskeletal:        General: Normal range of motion.     Cervical back: Normal range of motion.  Neurological: She is alert.  Skin: Skin is warm and dry.  Psychiatric: Her affect is blunt. Her speech is delayed. She is slowed. Cognition and memory are impaired. She expresses impulsivity. She expresses no homicidal and no suicidal ideation.    Review of Systems  Constitutional: Negative.   HENT: Negative.   Eyes: Negative.   Respiratory: Negative.   Cardiovascular: Negative.   Gastrointestinal: Negative.   Musculoskeletal: Negative.   Skin: Negative.   Neurological: Negative.   Psychiatric/Behavioral: Negative.     Blood  pressure 96/63, pulse 97, temperature 98.9 F (37.2 C), temperature source Oral, resp. rate 18, height 5\' 4"  (1.626 m), weight 67.2 kg, SpO2 97 %.Body mass index is 25.43 kg/m.  General Appearance: Casual  Eye Contact:  Good  Speech:  Clear and Coherent  Volume:  Normal  Mood:  Euthymic  Affect:  Constricted  Thought Process:  Coherent  Orientation:  Full (Time, Place, and Person)  Thought Content:  Logical  Suicidal Thoughts:  No  Homicidal Thoughts:  No  Memory:  Immediate;   Fair Recent;   Fair Remote;   Fair  Judgement:  Poor  Insight:  Shallow  Psychomotor Activity:  Decreased  Concentration:  Concentration: Fair  Recall:  [employer] of Knowledge:  Fair  Language:  Fair  Akathisia:  No  Handed:  Right  AIMS (if indicated):     Assets:  Desire for Improvement Physical Health  ADL's:  Impaired  Cognition:  Impaired,  Mild  Sleep:  Number of Hours: 9     Treatment Plan Summary: Daily contact with patient to assess and evaluate symptoms and progress in treatment, Medication management and Plan No change to medication.  Supportive counseling.  We have a good lead on a possible admission to a group home.  They are requesting a TB test and a repeat COVID-19 test which will be ordered.  There is a chance we may be able to look towards discharge by the end of the week.  Alethia Berthold, MD 08/10/2019, 4:11 PM

## 2019-08-10 NOTE — Progress Notes (Signed)
Recreation Therapy Notes  Date: 08/10/2019  Time: 9:30 am   Location: Craft room   Behavioral response: N/A   Intervention Topic: Coping Skills   Discussion/Intervention: Patient did not attend group.   Clinical Observations/Feedback:  Patient did not attend group.   Khadeeja Elden LRT/CTRS       Johniece Hornbaker 08/10/2019 12:26 PM

## 2019-08-10 NOTE — NC FL2 (Signed)
Julia [name] LEVEL OF CARE SCREENING TOOL     IDENTIFICATION  Patient Name: Julia [name] Birthdate: 2001-02-29 Sex: female Admission Date (Current Location): 07/10/2019  [place] and [place] Number:  Engineering geologist and Address:  [name], [address] [place], [place] [name] [address]      Provider Number: [id number]  Attending Physician Name and Address:  [name], [name], MD  Relative Name and Phone Number:       Current Level of Care: Hospital Recommended Level of Care: [facility], Other (Comment)(Group home) Prior Approval Number:    Date Approved/Denied:   PASRR Number:    Discharge Plan: Other (Comment)(Group home or [facility])    Current Diagnoses: Patient Active Problem List   Diagnosis Date Noted  . Mood disorder with psychosis ([place]) 05/16/2019  . Cocaine dependence with cocaine-induced psychotic disorder with complication ([place])   . Psychosis ([place]) 05/15/2019  . MDD (major depressive disorder), recurrent, severe, with psychosis ([place]) 02/16/2019  . Bipolar 1 disorder, depressed, severe ([place]) 01/06/2018  . Polysubstance dependence including opioid type drug, continuous use ([place]) 01/06/2018  . Petechiae 10/09/2017  . Social anxiety disorder 09/25/2016  . MDD (major depressive disorder), recurrent episode ([place]) 09/24/2016  . Substance induced mood disorder ([place]) 04/29/2016  . Depressed mood 04/17/2016  . Polysubstance abuse ([place])   . Recurrent UTI (urinary tract infection) postcoital 08/21/2015  . Dysmenorrhea 12/30/2013    Orientation RESPIRATION BLADDER Height & Weight     Self, Place, Time  Normal Continent Weight: 148 lb 2.4 oz (67.2 kg) Height:  5\' 4"  (162.6 cm)  BEHAVIORAL SYMPTOMS/MOOD NEUROLOGICAL BOWEL NUTRITION STATUS  Other (Comment)(verbal outburst at times) (none reported) Continent Diet(normal)  AMBULATORY STATUS COMMUNICATION OF NEEDS Skin   Independent   Normal                       Personal  Care Assistance Level of Assistance  (none reported)           Functional Limitations Info  (none reported)          SPECIAL CARE FACTORS FREQUENCY  (none reported)                    Contractures Contractures Info: Not present    Additional Factors Info  Code Status, Allergies Code Status Info: FULL Allergies Info: Abilify, zoloft           Current Medications (08/10/2019):  This is the current hospital active medication list Current Facility-Administered Medications  Medication Dose Route Frequency Provider Last Rate Last Admin  . acetaminophen (TYLENOL) tablet 650 mg  650 mg Oral Q6H PRN [name], MD   650 mg at 07/29/19 1732  . alum & mag hydroxide-simeth (MAALOX/MYLANTA) 200-200-20 MG/5ML suspension 30 mL  30 mL Oral Q4H PRN [name], MD      . benztropine (COGENTIN) tablet 2 mg  2 mg Oral BID PRN [name], MD   2 mg at 08/03/19 1902   Or  . benztropine mesylate (COGENTIN) injection 2 mg  2 mg Intramuscular BID PRN [name], MD      . diphenhydrAMINE (BENADRYL) capsule 50 mg  50 mg Oral Q6H PRN [name], MD   50 mg at 08/08/19 2124   Or  . diphenhydrAMINE (BENADRYL) injection 50 mg  50 mg Intramuscular Q6H PRN [name], MD   50 mg at 08/09/19 1515  .  FLUoxetine (PROZAC) capsule 40 mg  40 mg Oral Daily [name], MD   40 mg at 08/10/19 [id number]  . haloperidol (HALDOL) tablet 10 mg  10 mg Oral Q6H PRN Sharma Covert, MD   10 mg at 08/07/19 1109   Or  . haloperidol lactate (HALDOL) injection 10 mg  10 mg Intramuscular Q6H PRN Sharma Covert, MD   10 mg at 07/23/19 2052  . haloperidol (HALDOL) tablet 10 mg  10 mg Oral Q1400 Sharma Covert, MD   10 mg at 08/09/19 1448  . ibuprofen (ADVIL) tablet 600 mg  600 mg Oral Q6H PRN [name], John T, MD   600 mg at 08/01/19 1120  . magnesium hydroxide (MILK OF MAGNESIA) suspension 30 mL  30 mL Oral Daily PRN [name], MD      . paliperidone (INVEGA) 24 hr  tablet 3 mg  3 mg Oral Daily Sharma Covert, MD   3 mg at 08/10/19 0829  . paliperidone (INVEGA) 24 hr tablet 9 mg  9 mg Oral QHS [name], [name], MD   9 mg at 08/09/19 2202  . traZODone (DESYREL) tablet 50 mg  50 mg Oral QHS PRN [name], MD   50 mg at 08/03/19 2137  . ziprasidone (GEODON) injection 20 mg  20 mg Intramuscular Q6H PRN Sharma Covert, MD   20 mg at 08/07/19 1207     Discharge Medications: Please see discharge summary for a list of discharge medications.  Relevant Imaging Results:  Relevant Lab Results:   Additional [place], LCSW

## 2019-08-10 NOTE — Plan of Care (Signed)
D- Patient alert and oriented. Patient was in bed asleep on assessment, however, she did wake up to speak to this Probation officer. Patient stated that she had a good day today, "just tired". Patient denies any signs/symptoms of depression/anxiety. Patient also denies SI, HI, AVH, and pain at this time.   A- Scheduled medications administered to patient, per MD orders. Support and encouragement provided.  Routine safety checks conducted every 15 minutes.  Patient informed to notify staff with problems or concerns.  R- No adverse drug reactions noted. Patient contracts for safety at this time. Patient compliant with medications and treatment plan. Patient receptive, calm, and cooperative. Patient interacts well with others on the unit.  Patient remains safe at this time.  Problem: Education: Goal: Utilization of techniques to improve thought processes will improve Outcome: Progressing Goal: Knowledge of the prescribed therapeutic regimen will improve Outcome: Progressing   Problem: Activity: Goal: Interest or engagement in leisure activities will improve Outcome: Progressing Goal: Imbalance in normal sleep/wake cycle will improve Outcome: Progressing   Problem: Coping: Goal: Coping ability will improve Outcome: Progressing Goal: Will verbalize feelings Outcome: Progressing   Problem: Health Behavior/Discharge Planning: Goal: Ability to make decisions will improve Outcome: Progressing Goal: Compliance with therapeutic regimen will improve Outcome: Progressing   Problem: Role Relationship: Goal: Will demonstrate positive changes in social behaviors and relationships Outcome: Progressing   Problem: Safety: Goal: Ability to disclose and discuss suicidal ideas will improve Outcome: Progressing Goal: Ability to identify and utilize support systems that promote safety will improve Outcome: Progressing   Problem: Self-Concept: Goal: Will verbalize positive feelings about self Outcome:  Progressing Goal: Level of anxiety will decrease Outcome: Progressing   Problem: Education: Goal: Knowledge of the prescribed therapeutic regimen will improve Outcome: Progressing   Problem: Coping: Goal: Coping ability will improve Outcome: Progressing   Problem: Education: Goal: Knowledge of  General Education information/materials will improve Outcome: Progressing Goal: Emotional status will improve Outcome: Progressing

## 2019-08-10 NOTE — Progress Notes (Signed)
CSW facilitated [employer] with pt and Group home owner, [name]. [name] asked pt various questions. [name] reported she is willing to give pt a try and pt would need TB test and COVID test before she can come to the home. Macky Lower stated that she will talk with her QP and see if they can get things set up by Friday to have pt come move in.   Evalina Field, MSW, LCSW Clinical Social Work 08/10/2019 2:53 PM

## 2019-08-11 DIAGNOSIS — F29 Unspecified psychosis not due to a substance or known physiological condition: Secondary | ICD-10-CM

## 2019-08-11 NOTE — Plan of Care (Signed)
  Problem: Group Participation Goal: STG - Patient will engage in groups without prompting or encouragement from LRT x3 group sessions within 5 recreation therapy group sessions Description: STG - Patient will engage in groups without prompting or encouragement from LRT x3 group sessions within 5 recreation therapy group sessions Outcome: Not Progressing   

## 2019-08-11 NOTE — Progress Notes (Signed)
[facility] MD Progress Note  08/11/2019 11:29 AM Julia [name]  MRN:  [id number]   Subjective: Follow-up for this 19 year old female diagnosed with psychosis.  Patient reports that she is doing good today.  She states that she slept well last night and she has been eating well.  Patient is a 3 questions about nodding and shaking her head or using simple answer such as yes or no.  Patient does deny having any suicidal or homicidal ideations and denies any hallucinations today patient does not like he reports about a mom or anyone out to get her today.  Principal Problem: Psychosis ([place]) Diagnosis: Principal Problem:   Psychosis ([place]) Active Problems:   Polysubstance abuse ([place])   Depressed mood   Substance induced mood disorder ([place])  Total Time spent with patient: 20 minutes  Past Psychiatric History: Per patient  She was ADD- diagnosed 1 month ago Record review reveals patient has had multiple hospitalizations.   Note seem to suggest that she started having substance abuse problems around age 47 or 19 years old after her mother moved out of the house. Earlier hospitalizations mostly focused on her substance abuse problems and behavior problems.  Since turning 17 she has had multiple other hospitalizations.  Substance abuse still a major issue but it seems like psychotic symptoms and mood instability have been more prominent.  She does have a history of suicide attempts.  Has had multiple medications.  Able to recall the Abilify made her have a dystonic reaction.  Past Medical History:  Past Medical History:  Diagnosis Date  . Burning with urination 05/03/2015  . Contraceptive management 05/03/2015  . Depression   . Dysmenorrhea 12/30/2013  . Heroin addiction ([place])   . Menorrhagia 12/30/2013  . Menstrual extraction 12/30/2013  . Migraines   . Social anxiety disorder 09/25/2016  . Suicidal ideations   . Vaginal odor 05/03/2015    Past Surgical History:  Procedure Laterality Date  . NO PAST  SURGERIES     Family History:  Family History  Problem Relation Age of Onset  . Depression Mother   . Hypertension Father   . Hyperlipidemia Father   . Cancer Paternal Grandmother        breast, uterine  . Cirrhosis Paternal Grandfather        due to alcohol   Family Psychiatric  History: Depression in a couple members of the family; aunt with schizophrenia Social History:  Social History   Substance and Sexual Activity  Alcohol Use Yes   Comment: BAC was clear     Social History   Substance and Sexual Activity  Drug Use Yes  . Types: Marijuana, Oxycodone   Comment: reports oxycodone and marijuana    Social History   Socioeconomic History  . Marital status: Single    Spouse name: Not on file  . Number of children: Not on file  . Years of education: Not on file  . Highest education level: Not on file  Occupational History  . Occupation: Unemployed  Tobacco Use  . Smoking status: Current Every Day Smoker    Packs/day: 0.50    Types: Cigarettes  . Smokeless tobacco: Never Used  . Tobacco comment: Not ready to quit  Substance and Sexual Activity  . Alcohol use: Yes    Comment: BAC was clear  . Drug use: Yes    Types: Marijuana, Oxycodone    Comment: reports oxycodone and marijuana  . Sexual activity: Yes    Birth control/protection: Implant  Other Topics Concern  . Not on file  Social History Narrative   06/23/2019:  Pt stated that she is homeless, that she is a high school graduate, and that she is unemployed and not followed by any outpatient provider.      Lives with Dad. Mom has LGD, lives in [place]. 11th grader. Dog.   Social Determinants of Health   Financial Resource Strain:   . Difficulty of Paying Living Expenses: Not on file  Food Insecurity:   . Worried About Charity fundraiser in the Last Year: Not on file  . Ran Out of Food in the Last Year: Not on file  Transportation Needs:   . Lack of Transportation (Medical): Not on file  . Lack of  Transportation (Non-Medical): Not on file  Physical Activity:   . Days of Exercise per Week: Not on file  . Minutes of Exercise per Session: Not on file  Stress:   . Feeling of Stress : Not on file  Social Connections:   . Frequency of Communication with Friends and Family: Not on file  . Frequency of Social Gatherings with Friends and Family: Not on file  . Attends Religious Services: Not on file  . Active Member of Clubs or Organizations: Not on file  . Attends Archivist Meetings: Not on file  . Marital Status: Not on file   Additional Social History:                         Sleep: Good  Appetite:  Good  Current Medications: Current Facility-Administered Medications  Medication Dose Route Frequency Provider Last Rate Last Admin  . acetaminophen (TYLENOL) tablet 650 mg  650 mg Oral Q6H PRN [name], MD   650 mg at 07/29/19 1732  . alum & mag hydroxide-simeth (MAALOX/MYLANTA) 200-200-20 MG/5ML suspension 30 mL  30 mL Oral Q4H PRN [name], MD      . benztropine (COGENTIN) tablet 2 mg  2 mg Oral BID PRN [name], MD   2 mg at 08/03/19 1902   Or  . benztropine mesylate (COGENTIN) injection 2 mg  2 mg Intramuscular BID PRN [name], MD      . diphenhydrAMINE (BENADRYL) capsule 50 mg  50 mg Oral Q6H PRN [name], MD   50 mg at 08/08/19 2124   Or  . diphenhydrAMINE (BENADRYL) injection 50 mg  50 mg Intramuscular Q6H PRN [name], MD   50 mg at 08/09/19 1515  . FLUoxetine (PROZAC) capsule 40 mg  40 mg Oral Daily [name], MD   40 mg at 08/11/19 [id number]  . haloperidol (HALDOL) tablet 10 mg  10 mg Oral Q6H PRN Sharma Covert, MD   10 mg at 08/07/19 1109   Or  . haloperidol lactate (HALDOL) injection 10 mg  10 mg Intramuscular Q6H PRN Sharma Covert, MD   10 mg at 07/23/19 2052  . haloperidol (HALDOL) tablet 10 mg  10 mg Oral Q1400 Sharma Covert, MD   10 mg at 08/10/19 1300  . ibuprofen (ADVIL) tablet  600 mg  600 mg Oral Q6H PRN [name], John T, MD   600 mg at 08/01/19 1120  . magnesium hydroxide (MILK OF MAGNESIA) suspension 30 mL  30 mL Oral Daily PRN [name], MD      . paliperidone (INVEGA) 24 hr tablet 3 mg  3 mg Oral Daily Myles Lipps  Kellie Simmering, MD   3 mg at 08/11/19 [id number]  . paliperidone (INVEGA) 24 hr tablet 9 mg  9 mg Oral QHS [name], [name], MD   9 mg at 08/10/19 2154  . traZODone (DESYREL) tablet 50 mg  50 mg Oral QHS PRN [name], MD   50 mg at 08/03/19 2137  . tuberculin injection 5 Units  5 Units Intradermal Once [name], [name], MD   5 Units at 08/10/19 1258  . ziprasidone (GEODON) injection 20 mg  20 mg Intramuscular Q6H PRN Sharma Covert, MD   20 mg at 08/07/19 1207    Lab Results: No results found for this or any previous visit (from the past 48 hour(s)).  Blood Alcohol level:  Lab Results  Component Value Date   ETH <10 07/09/2019   ETH <10 [id number]    Metabolic Disorder Labs: Lab Results  Component Value Date   HGBA1C 5.2 05/17/2019   MPG 102.54 05/17/2019   MPG 108.28 02/17/2019   Lab Results  Component Value Date   PROLACTIN 31.9 (H) 02/17/2019   PROLACTIN 29.6 (H) 09/25/2016   Lab Results  Component Value Date   CHOL 144 05/17/2019   TRIG 65 05/17/2019   HDL 35 (L) 05/17/2019   CHOLHDL 4.1 05/17/2019   VLDL 13 05/17/2019   LDLCALC 96 05/17/2019   LDLCALC 76 02/17/2019    Physical Findings: AIMS:  , ,  ,  ,    CIWA:    COWS:     Musculoskeletal: Strength & Muscle Tone: within normal limits Gait & Station: normal Patient leans: N/A  Psychiatric Specialty Exam: Physical Exam  Nursing note and vitals reviewed. Constitutional: She is oriented to person, place, and time. She appears well-developed and well-nourished.  Respiratory: Effort normal.  Musculoskeletal:        General: Normal range of motion.  Neurological: She is alert and oriented to person, place, and time.  Skin: Skin is warm.    Review of Systems   Constitutional: Negative.   HENT: Negative.   Eyes: Negative.   Respiratory: Negative.   Cardiovascular: Negative.   Gastrointestinal: Negative.   Genitourinary: Negative.   Musculoskeletal: Negative.   Skin: Negative.   Neurological: Negative.     Blood pressure 96/63, pulse 97, temperature 98.9 F (37.2 C), temperature source Oral, resp. rate 18, height 5\' 4"  (1.626 m), weight 67.2 kg, SpO2 97 %.Body mass index is 25.43 kg/m.  General Appearance: Casual  Eye Contact:  Good  Speech:  Clear and Coherent and Slow  Volume:  Normal  Mood:  Anxious  Affect:  Constricted  Thought Process:  Disorganized and Descriptions of Associations: Circumstantial  Orientation:  Full (Time, Place, and Person)  Thought Content:  Paranoid Ideation  Suicidal Thoughts:  No  Homicidal Thoughts:  No  Memory:  Immediate;   Fair Recent;   Fair Remote;   Fair  Judgement:  Fair  Insight:  Fair  Psychomotor Activity:  Decreased  Concentration:  Concentration: Fair  Recall:  [employer] of Knowledge:  Fair  Language:  Poor  Akathisia:  No  Handed:  Right  AIMS (if indicated):     Assets:  Communication Skills Desire for Improvement Physical Health Social Support  ADL's:  Intact  Cognition:  WNL  Sleep:  Number of Hours: 8   Assessment: Patient presents in her room lying in the bed but is awake with her head covered up.  Patient reports that she is just covering her head  to keep the light out.  Patient is not very communicative today but answers yes and no questions appropriately.  Patient continues to present with constricted affect.  She has been seen walking around the unit and going to the day room and having meals.  Patient has not had any outbursts in the last 24 hours.  Social work is reported there continue to work on placement for this patient.  Treatment Plan Summary: Daily contact with patient to assess and evaluate symptoms and progress in treatment and Medication management  Continue  Prozac 40 mg p.o. daily for depression and mood Continue Haldol 10 mg p.o. daily for psychosis Continue Invega 3 mg p.o. daily and 9 mg p.o. nightly for psychosis Continue trazodone 50 mg p.o. nightly as needed for insomnia Continue agitation protocol of Benadryl, Haldol every 6 hours as needed for agitation Encourage group therapy participation Continue every 15 minute safety checks  Lewis Shock, FNP 08/11/2019, 11:29 AM

## 2019-08-11 NOTE — Progress Notes (Signed)
Recreation Therapy Notes    Date: 08/11/2019  Time: 9:30 am   Location: Craft room   Behavioral response: N/A   Intervention Topic: Decision Making    Discussion/Intervention: Patient did not attend group.   Clinical Observations/Feedback:  Patient did not attend group.   Eyleen Rawlinson LRT/CTRS        Leith Hedlund 08/11/2019 12:32 PM

## 2019-08-11 NOTE — [facility] Group Notes (Signed)
LCSW Group Therapy Note  08/11/2019 11:31 AM  Type of Therapy/Topic:  Group Therapy:  Emotion Regulation  Participation Level:  Minimal   Description of Group:   The purpose of this group is to assist patients in learning to regulate negative emotions and experience positive emotions. Patients will be guided to discuss ways in which they have been vulnerable to their negative emotions. These vulnerabilities will be juxtaposed with experiences of positive emotions or situations, and patients will be challenged to use positive emotions to combat negative ones. Special emphasis will be placed on coping with negative emotions in conflict situations, and patients will process healthy conflict resolution skills.  Therapeutic Goals: 1. Patient will identify two positive emotions or experiences to reflect on in order to balance out negative emotions 2. Patient will label two or more emotions that they find the most difficult to experience 3. Patient will demonstrate positive conflict resolution skills through discussion and/or role plays  Summary of Patient Progress: Pt was present in group but did not engage much in the group discussion. Pt identified emotion regulation as keeping track of your emotions. Pt also identified the beach as a place that brings her joy and comfort. Pt did not engage much more in the group discussion and had her held down towards the end of group.   Therapeutic Modalities:   Cognitive Behavioral Therapy Feelings Identification Dialectical Behavioral Therapy   Evalina Field, MSW, LCSW Clinical Social Work 08/11/2019 11:31 AM

## 2019-08-11 NOTE — Progress Notes (Signed)
D:Psychosis  A:Patient remained in room most of AM shift. No group participation  Patient laying in bed .  Compliant  With medication  Out this afternoon  In courtyard  Patient  Started yelling  That the voices in her head  Are coming forward . Patient starting to bounce the ball hard , Patient came in  Received medication at request . No ADL's this shift. Aware of possible placement  Tomorrow . Encourage patient participation with unit programming . Instruction  Given on  Medication , verbalize understanding.  R: Voice no other concerns. Staff continue to monitor

## 2019-08-11 NOTE — Plan of Care (Signed)
Aware of  medication received. Patient aware of [place] education , able to verbalize understanding  with redirection from staff. Emotional and mental  health status  improving . Able to maintain frustration  no anger management  concerns. V oice of no safety concerns . Working on Scientific laboratory technician.    Problem: Education: Goal: Utilization of techniques to improve thought processes will improve Outcome: Progressing Goal: Knowledge of the prescribed therapeutic regimen will improve Outcome: Progressing   Problem: Activity: Goal: Interest or engagement in leisure activities will improve Outcome: Progressing Goal: Imbalance in normal sleep/wake cycle will improve Outcome: Progressing   Problem: Coping: Goal: Coping ability will improve Outcome: Progressing Goal: Will verbalize feelings Outcome: Progressing   Problem: Health Behavior/Discharge Planning: Goal: Ability to make decisions will improve Outcome: Progressing Goal: Compliance with therapeutic regimen will improve Outcome: Progressing   Problem: Role Relationship: Goal: Will demonstrate positive changes in social behaviors and relationships Outcome: Progressing   Problem: Safety: Goal: Ability to disclose and discuss suicidal ideas will improve Outcome: Progressing Goal: Ability to identify and utilize support systems that promote safety will improve Outcome: Progressing   Problem: Self-Concept: Goal: Will verbalize positive feelings about self Outcome: Progressing Goal: Level of anxiety will decrease Outcome: Progressing   Problem: Education: Goal: Knowledge of the prescribed therapeutic regimen will improve Outcome: Progressing   Problem: Coping: Goal: Coping ability will improve Outcome: Progressing   Problem: Education: Goal: Knowledge of [facility] Education information/materials will improve Outcome: Progressing Goal: Emotional status will improve Outcome: Progressing

## 2019-08-12 LAB — SARS CORONAVIRUS 2 (TAT 6-24 HRS): SARS Coronavirus 2: NEGATIVE

## 2019-08-12 NOTE — Progress Notes (Signed)
Recreation Therapy Notes   Date: 08/12/2019  Time: 9:30 am  Location: Craft room  Behavioral response: Appropriate  Intervention Topic: Relaxation   Discussion/Intervention:  Group content today was focused on relaxation. The group defined relaxation and identified healthy ways to relax. Individuals expressed how much time they spend relaxing. Patients expressed how much their life would be if they did not make time for themselves to relax. The group stated ways they could improve their relaxation techniques in the future.  Individuals participated in the intervention "Time to Relax" where they had a chance to experience different relaxation techniques.  Clinical Observations/Feedback:  Patient came to group and was focused on what peers and staff had to say about relaxation. Participant left group early due to unknown reasons and did not return. Desarea Ohagan LRT/CTRS         Gradie Butrick 08/12/2019 12:19 PM

## 2019-08-12 NOTE — Tx Team (Signed)
Interdisciplinary Treatment and Diagnostic Plan Update  08/12/2019 Time of Session: 8:30am Julia [name] MRN: [id number]  Principal Diagnosis: Psychosis [facility])  Secondary Diagnoses: Principal Problem:   Psychosis ([place]) Active Problems:   Polysubstance abuse ([place])   Depressed mood   Substance induced mood disorder ([place])   Current Medications:  Current Facility-Administered Medications  Medication Dose Route Frequency Provider Last Rate Last Admin  . acetaminophen (TYLENOL) tablet 650 mg  650 mg Oral Q6H PRN [name], MD   650 mg at 07/29/19 1732  . alum & mag hydroxide-simeth (MAALOX/MYLANTA) 200-200-20 MG/5ML suspension 30 mL  30 mL Oral Q4H PRN [name], MD      . benztropine (COGENTIN) tablet 2 mg  2 mg Oral BID PRN [name], MD   2 mg at 08/03/19 1902   Or  . benztropine mesylate (COGENTIN) injection 2 mg  2 mg Intramuscular BID PRN [name], MD      . diphenhydrAMINE (BENADRYL) capsule 50 mg  50 mg Oral Q6H PRN [name], MD   50 mg at 08/11/19 1557   Or  . diphenhydrAMINE (BENADRYL) injection 50 mg  50 mg Intramuscular Q6H PRN [name], MD   50 mg at 08/09/19 1515  . FLUoxetine (PROZAC) capsule 40 mg  40 mg Oral Daily [name], MD   40 mg at 08/12/19 0802  . haloperidol (HALDOL) tablet 10 mg  10 mg Oral Q6H PRN Sharma Covert, MD   10 mg at 08/07/19 1109   Or  . haloperidol lactate (HALDOL) injection 10 mg  10 mg Intramuscular Q6H PRN Sharma Covert, MD   10 mg at 07/23/19 2052  . haloperidol (HALDOL) tablet 10 mg  10 mg Oral Q1400 Sharma Covert, MD   10 mg at 08/11/19 1420  . ibuprofen (ADVIL) tablet 600 mg  600 mg Oral Q6H PRN [name], John T, MD   600 mg at 08/01/19 1120  . magnesium hydroxide (MILK OF MAGNESIA) suspension 30 mL  30 mL Oral Daily PRN [name], MD      . paliperidone (INVEGA) 24 hr tablet 3 mg  3 mg Oral Daily Sharma Covert, MD   3 mg at 08/12/19 0802  . paliperidone (INVEGA) 24  hr tablet 9 mg  9 mg Oral QHS [name], [name], MD   9 mg at 08/11/19 2147  . traZODone (DESYREL) tablet 50 mg  50 mg Oral QHS PRN [name], MD   50 mg at 08/03/19 2137  . tuberculin injection 5 Units  5 Units Intradermal Once [name], [name], MD   5 Units at 08/10/19 1258  . ziprasidone (GEODON) injection 20 mg  20 mg Intramuscular Q6H PRN Sharma Covert, MD   20 mg at 08/07/19 1207   PTA Medications: Medications Prior to Admission  Medication Sig Dispense Refill Last Dose  . etonogestrel (NEXPLANON) 68 MG IMPL implant 1 each (68 mg total) by Subdermal route once for 1 dose. (Birth control method) 1 each 0   . FLUoxetine (PROZAC) 10 MG capsule Take 3 capsules (30 mg total) by mouth daily. 90 capsule 1   . hydrOXYzine (ATARAX/VISTARIL) 25 MG tablet Take 1 tablet (25 mg total) by mouth every 6 (six) hours as needed for anxiety. 60 tablet 1   . QUEtiapine (SEROQUEL) 50 MG tablet Take 1 tablet (50 mg total) by mouth daily. 30 tablet 1   . traZODone (DESYREL) 50 MG tablet  Take 50 mg by mouth at bedtime as needed. for sleep       Patient Stressors: Financial difficulties Loss of Mother Marital or family conflict Substance abuse  Patient Strengths: Ability for insight Communication skills  Treatment Modalities: Medication Management, Group therapy, Case management,  1 to 1 session with clinician, Psychoeducation, Recreational therapy.   Physician Treatment Plan for Primary Diagnosis: Psychosis ([place]) Long Term Goal(s): Improvement in symptoms so as ready for discharge Improvement in symptoms so as ready for discharge   Short Term Goals: Ability to verbalize feelings will improve Ability to disclose and discuss suicidal ideas Ability to demonstrate self-control will improve Ability to identify and develop effective coping behaviors will improve Compliance with prescribed medications will improve  Medication Management: Evaluate patient's response, side effects, and tolerance  of medication regimen.  Therapeutic Interventions: 1 to 1 sessions, Unit Group sessions and Medication administration.  Evaluation of Outcomes: Progressing  Physician Treatment Plan for Secondary Diagnosis: Principal Problem:   Psychosis ([place]) Active Problems:   Polysubstance abuse ([place])   Depressed mood   Substance induced mood disorder ([place])  Long Term Goal(s): Improvement in symptoms so as ready for discharge Improvement in symptoms so as ready for discharge   Short Term Goals: Ability to verbalize feelings will improve Ability to disclose and discuss suicidal ideas Ability to demonstrate self-control will improve Ability to identify and develop effective coping behaviors will improve Compliance with prescribed medications will improve     Medication Management: Evaluate patient's response, side effects, and tolerance of medication regimen.  Therapeutic Interventions: 1 to 1 sessions, Unit Group sessions and Medication administration.  Evaluation of Outcomes: Progressing   RN Treatment Plan for Primary Diagnosis: Psychosis ([place]) Long Term Goal(s): Knowledge of disease and therapeutic regimen to maintain health will improve  Short Term Goals: Ability to participate in decision making will improve, Ability to verbalize feelings will improve, Ability to disclose and discuss suicidal ideas, Ability to identify and develop effective coping behaviors will improve and Compliance with prescribed medications will improve  Medication Management: RN will administer medications as ordered by provider, will assess and evaluate patient's response and provide education to patient for prescribed medication. RN will report any adverse and/or side effects to prescribing provider.  Therapeutic Interventions: 1 on 1 counseling sessions, Psychoeducation, Medication administration, Evaluate responses to treatment, Monitor vital signs and CBGs as ordered, Perform/monitor CIWA, COWS, AIMS and Fall  Risk screenings as ordered, Perform wound care treatments as ordered.  Evaluation of Outcomes: Progressing   LCSW Treatment Plan for Primary Diagnosis: Psychosis ([place]) Long Term Goal(s): Safe transition to appropriate next level of care at discharge, Engage patient in therapeutic group addressing interpersonal concerns.  Short Term Goals: Engage patient in aftercare planning with referrals and resources and Increase skills for wellness and recovery  Therapeutic Interventions: Assess for all discharge needs, 1 to 1 time with Social worker, Explore available resources and support systems, Assess for adequacy in community support network, Educate family and significant other(s) on suicide prevention, Complete Psychosocial Assessment, Interpersonal group therapy.  Evaluation of Outcomes: Progressing   Progress in Treatment: Attending groups: Yes. Participating in groups: No. Taking medication as prescribed: Yes. Toleration medication: Yes. Family/Significant other contact made: No, will contact:  pt declined spe; with pt Patient understands diagnosis: Yes. Discussing patient identified problems/goals with staff: Yes. Medical problems stabilized or resolved: Yes. Denies suicidal/homicidal ideation: Yes. Issues/concerns per patient self-inventory: No. Other:   New problem(s) identified: No, Describe:  None  New Short Term/Long  Term Goal(s): Medication stabilization, elimination of SI thoughts, and development of a comprehensive mental wellness plan.   Patient Goals:    "to have a plan"  Update 07/27/2019: No changes at this time. Update 07/31/2019: No changes at this time Update 08/12/19: No change at this time.  Discharge Plan or Barriers: Patient is currently homeless.  Patient aftercare depends on where patient will be staying. CSW is attempting to identify an available bed at local homeless shelters.  Patient reports that she can not return to [facility]. Update 07/22/19-Pt  displaying psychotic behavior. No other social supports identified. CSW's attempted to contact pt boyfriend and father. Awaiting response from boyfriend and pt father lives in a nursing home.  Discharge plan TBD Update 07/27/2019:  Patient continues to adjust to her medications. She continues to have some delusions in regards of being a part of witness protection and having the police locate housing for her. Patient is homeless with no supports from home to assist with housing.  Application for [employer] in [name] has been sent.  No word yet on possible housing.  Patient can not stay with father, return to [facility] and calls to her boyfriend have not been returned. Update 07/31/2019: CSW messaged Ana Leamon at [facility] about obtaining medicaid for the patient and helping her through the process of receiving medicaid so that patient can have more opportunities for aftercare and services. Update 08/12/19:Pt is homeless. Pt has been referred to a group home and is agreeable with this decision, awaiting decision. APS report made while pt hospitalized, DSS referred pt for guardianship, awaiting decision. Medicaid application started while pt hospitalized, awaiting decision.  Reason for Continuation of Hospitalization: Delusions  Medication stabilization  Estimated Length of Stay: TBD   Attendees: Patient:  08/07/2019  Physician: Alethia Berthold 08/07/2019   Nursing: Nat Math [place]ell 08/07/2019   RN Care Manager: 08/07/2019   Social Worker: Sanjuana Kava [facility] 08/07/2019   Recreational Therapist:  08/07/2019   Other:  08/07/2019   Other:  08/07/2019   Other: 08/07/2019       Scribe for Treatment Team: Yvette Rack, [place] 08/12/2019 10:10 AM

## 2019-08-12 NOTE — Progress Notes (Signed)
   08/12/19 1040  PPD Results  Does patient have an induration at the injection site? No

## 2019-08-12 NOTE — Plan of Care (Signed)
Patient isolative and sleeping since this writer came on the unit  Problem: Activity: Goal: Imbalance in normal sleep/wake cycle will improve Outcome: Not Progressing

## 2019-08-12 NOTE — Progress Notes (Signed)
Patient was in her room upon arrival to the unit.Patient.Patient denies SI/HI/AVH, anxiety and pain.Patientcompliant with medication administration per MD orders. Patient given education. Patient given support and encouragement to be active in her treatment plan.Patient being monitored Q 15 minutes for safety per unit protocol. Patient remains safe on the unit.Patient observed interacting appropriately with staff and peers on the unit.

## 2019-08-12 NOTE — Plan of Care (Signed)
Patient didn't have any complaints of anxiety this evening  Problem: Self-Concept: Goal: Level of anxiety will decrease Outcome: Progressing

## 2019-08-12 NOTE — Progress Notes (Signed)
Patient was in her room upon arrival to the unit.Patient.Patient denies SI/HI/AVH, anxiety and pain.Patientwas asleep during medication administration and didn't want to get up to take her nigh medication.Patient given education. Patient given support and encouragement to be active in her treatment plan.Patient being monitored Q 15 minutes for safety per unit protocol. Patient remains safe on the unit.Patient observed interacting appropriately with staff and peers on the unit.

## 2019-08-12 NOTE — Progress Notes (Signed)
CSW spoke with [name] with [facility] who reported that they will take pt, but will call CSW back later today with the date that they will be able to pick pt up and have pt room ready. [name] asked for TB test and COVID results. CSW sent TB results and will send COVID results as soon as they are available.   Evalina Field, MSW, LCSW Clinical Social Work 08/12/2019 11:15 AM

## 2019-08-12 NOTE — [facility] Group Notes (Signed)
Balance In Life 08/12/2019 1PM  Type of Therapy/Topic:  Group Therapy:  Balance in Life  Participation Level:  Did Not Attend  Description of Group:   This group will address the concept of balance and how it feels and looks when one is unbalanced. Patients will be encouraged to process areas in their lives that are out of balance and identify reasons for remaining unbalanced. Facilitators will guide patients in utilizing problem-solving interventions to address and correct the stressor making their life unbalanced. Understanding and applying boundaries will be explored and addressed for obtaining and maintaining a balanced life. Patients will be encouraged to explore ways to assertively make their unbalanced needs known to significant others in their lives, using other group members and facilitator for support and feedback.  Therapeutic Goals: 1. Patient will identify two or more emotions or situations they have that consume much of in their lives. 2. Patient will identify signs/triggers that life has become out of balance:  3. Patient will identify two ways to set boundaries in order to achieve balance in their lives:  4. Patient will demonstrate ability to communicate their needs through discussion and/or role plays  Summary of Patient Progress:    Therapeutic Modalities:   Cognitive Behavioral Therapy Solution-Focused Therapy Assertiveness Training  Lazarius Rivkin Lynelle Smoke, LCSW

## 2019-08-12 NOTE — Plan of Care (Signed)
Pt denies depression, anxiety, SI, HI and AVH. Pt was educated on care plan and encouraged to attend groups. Collier Bullock RN Problem: Education: Goal: Utilization of techniques to improve thought processes will improve Outcome: Progressing Goal: Knowledge of the prescribed therapeutic regimen will improve Outcome: Progressing   Problem: Activity: Goal: Interest or engagement in leisure activities will improve Outcome: Progressing Goal: Imbalance in normal sleep/wake cycle will improve Outcome: Progressing   Problem: Coping: Goal: Coping ability will improve Outcome: Progressing Goal: Will verbalize feelings Outcome: Progressing   Problem: Health Behavior/Discharge Planning: Goal: Ability to make decisions will improve Outcome: Progressing Goal: Compliance with therapeutic regimen will improve Outcome: Progressing   Problem: Role Relationship: Goal: Will demonstrate positive changes in social behaviors and relationships Outcome: Progressing   Problem: Safety: Goal: Ability to disclose and discuss suicidal ideas will improve Outcome: Progressing Goal: Ability to identify and utilize support systems that promote safety will improve Outcome: Progressing   Problem: Self-Concept: Goal: Will verbalize positive feelings about self Outcome: Progressing Goal: Level of anxiety will decrease Outcome: Progressing   Problem: Education: Goal: Knowledge of the prescribed therapeutic regimen will improve Outcome: Progressing   Problem: Coping: Goal: Coping ability will improve Outcome: Progressing   Problem: Education: Goal: Knowledge of [facility] Education information/materials will improve Outcome: Progressing Goal: Emotional status will improve Outcome: Progressing

## 2019-08-12 NOTE — Progress Notes (Signed)
[facility] MD Progress Note  08/12/2019 12:29 PM Julia [name]  MRN:  [id number] Subjective: Follow-up for this young woman with evolving psychotic disorder and history of substance abuse.  Patient continues today to talk about her believes that someone is following her.  She is concerned that when she goes to the group home they may not believe her.  Patient's behavior on the ward has mostly been calm.  For the most part stays to herself although she is capable of interacting with others.  Has not acted out badly in the last day.  Patient does understand that we may have a placement arranged for her as early as tomorrow. Principal Problem: Psychosis ([place]) Diagnosis: Principal Problem:   Psychosis ([place]) Active Problems:   Polysubstance abuse ([place])   Depressed mood   Substance induced mood disorder ([place])  Total Time spent with patient: 30 minutes  Past Psychiatric History: Past history of substance abuse problems and behavior problems going back to early adolescence.  Over the last year or so seems to have developed more clear and persistent psychotic symptoms  Past Medical History:  Past Medical History:  Diagnosis Date  . Burning with urination 05/03/2015  . Contraceptive management 05/03/2015  . Depression   . Dysmenorrhea 12/30/2013  . Heroin addiction ([place])   . Menorrhagia 12/30/2013  . Menstrual extraction 12/30/2013  . Migraines   . Social anxiety disorder 09/25/2016  . Suicidal ideations   . Vaginal odor 05/03/2015    Past Surgical History:  Procedure Laterality Date  . NO PAST SURGERIES     Family History:  Family History  Problem Relation Age of Onset  . Depression Mother   . Hypertension Father   . Hyperlipidemia Father   . Cancer Paternal Grandmother        breast, uterine  . Cirrhosis Paternal Grandfather        due to alcohol   Family Psychiatric  History: See previous Social History:  Social History   Substance and Sexual Activity  Alcohol Use Yes   Comment: BAC  was clear     Social History   Substance and Sexual Activity  Drug Use Yes  . Types: Marijuana, Oxycodone   Comment: reports oxycodone and marijuana    Social History   Socioeconomic History  . Marital status: Single    Spouse name: Not on file  . Number of children: Not on file  . Years of education: Not on file  . Highest education level: Not on file  Occupational History  . Occupation: Unemployed  Tobacco Use  . Smoking status: Current Every Day Smoker    Packs/day: 0.50    Types: Cigarettes  . Smokeless tobacco: Never Used  . Tobacco comment: Not ready to quit  Substance and Sexual Activity  . Alcohol use: Yes    Comment: BAC was clear  . Drug use: Yes    Types: Marijuana, Oxycodone    Comment: reports oxycodone and marijuana  . Sexual activity: Yes    Birth control/protection: Implant  Other Topics Concern  . Not on file  Social History Narrative   06/23/2019:  Pt stated that she is homeless, that she is a high school graduate, and that she is unemployed and not followed by any outpatient provider.      Lives with Dad. Mom has LGD, lives in [place]. 11th grader. Dog.   Social Determinants of Health   Financial Resource Strain:   . Difficulty of Paying Living Expenses: Not on file  Food Insecurity:   . Worried About Charity fundraiser in the Last Year: Not on file  . Ran Out of Food in the Last Year: Not on file  Transportation Needs:   . Lack of Transportation (Medical): Not on file  . Lack of Transportation (Non-Medical): Not on file  Physical Activity:   . Days of Exercise per Week: Not on file  . Minutes of Exercise per Session: Not on file  Stress:   . Feeling of Stress : Not on file  Social Connections:   . Frequency of Communication with Friends and Family: Not on file  . Frequency of Social Gatherings with Friends and Family: Not on file  . Attends Religious Services: Not on file  . Active Member of Clubs or Organizations: Not on file  . Attends English as a second language teacher Meetings: Not on file  . Marital Status: Not on file   Additional Social History:                         Sleep: Fair  Appetite:  Fair  Current Medications: Current Facility-Administered Medications  Medication Dose Route Frequency Provider Last Rate Last Admin  . acetaminophen (TYLENOL) tablet 650 mg  650 mg Oral Q6H PRN [name], MD   650 mg at 07/29/19 1732  . alum & mag hydroxide-simeth (MAALOX/MYLANTA) 200-200-20 MG/5ML suspension 30 mL  30 mL Oral Q4H PRN [name], MD      . benztropine (COGENTIN) tablet 2 mg  2 mg Oral BID PRN [name], MD   2 mg at 08/03/19 1902   Or  . benztropine mesylate (COGENTIN) injection 2 mg  2 mg Intramuscular BID PRN [name], MD      . diphenhydrAMINE (BENADRYL) capsule 50 mg  50 mg Oral Q6H PRN [name], MD   50 mg at 08/11/19 1557   Or  . diphenhydrAMINE (BENADRYL) injection 50 mg  50 mg Intramuscular Q6H PRN [name], MD   50 mg at 08/09/19 1515  . FLUoxetine (PROZAC) capsule 40 mg  40 mg Oral Daily [name], MD   40 mg at 08/12/19 0802  . haloperidol (HALDOL) tablet 10 mg  10 mg Oral Q6H PRN Sharma Covert, MD   10 mg at 08/07/19 1109   Or  . haloperidol lactate (HALDOL) injection 10 mg  10 mg Intramuscular Q6H PRN Sharma Covert, MD   10 mg at 07/23/19 2052  . haloperidol (HALDOL) tablet 10 mg  10 mg Oral Q1400 Sharma Covert, MD   10 mg at 08/11/19 1420  . ibuprofen (ADVIL) tablet 600 mg  600 mg Oral Q6H PRN Evette Diclemente T, MD   600 mg at 08/01/19 1120  . magnesium hydroxide (MILK OF MAGNESIA) suspension 30 mL  30 mL Oral Daily PRN [name], MD      . paliperidone (INVEGA) 24 hr tablet 3 mg  3 mg Oral Daily Sharma Covert, MD   3 mg at 08/12/19 0802  . paliperidone (INVEGA) 24 hr tablet 9 mg  9 mg Oral QHS Naveyah Iacovelli, [name], MD   9 mg at 08/11/19 2147  . traZODone (DESYREL) tablet 50 mg  50 mg Oral QHS PRN [name], MD   50 mg at  08/03/19 2137  . tuberculin injection 5 Units  5 Units Intradermal Once Mesiah Manzo, [name], MD   5 Units at 08/10/19 1258  .  ziprasidone (GEODON) injection 20 mg  20 mg Intramuscular Q6H PRN Sharma Covert, MD   20 mg at 08/07/19 1207    Lab Results: No results found for this or any previous visit (from the past 48 hour(s)).  Blood Alcohol level:  Lab Results  Component Value Date   ETH <10 07/09/2019   ETH <10 [id number]    Metabolic Disorder Labs: Lab Results  Component Value Date   HGBA1C 5.2 05/17/2019   MPG 102.54 05/17/2019   MPG 108.28 02/17/2019   Lab Results  Component Value Date   PROLACTIN 31.9 (H) 02/17/2019   PROLACTIN 29.6 (H) 09/25/2016   Lab Results  Component Value Date   CHOL 144 05/17/2019   TRIG 65 05/17/2019   HDL 35 (L) 05/17/2019   CHOLHDL 4.1 05/17/2019   VLDL 13 05/17/2019   LDLCALC 96 05/17/2019   LDLCALC 76 02/17/2019    Physical Findings: AIMS:  , ,  ,  ,    CIWA:    COWS:     Musculoskeletal: Strength & Muscle Tone: within normal limits Gait & Station: normal Patient leans: N/A  Psychiatric Specialty Exam: Physical Exam  Nursing note and vitals reviewed. Constitutional: She appears well-developed and well-nourished.  HENT:  Head: Normocephalic and atraumatic.  Eyes: Pupils are equal, round, and reactive to light. Conjunctivae are normal.  Cardiovascular: Regular rhythm and normal heart sounds.  Respiratory: Effort normal. No respiratory distress.  GI: Soft.  Musculoskeletal:        General: Normal range of motion.     Cervical back: Normal range of motion.  Neurological: She is alert.  Skin: Skin is warm and dry.  Psychiatric: Her affect is blunt. Her speech is delayed. She is slowed. Cognition and memory are impaired. She expresses impulsivity. She expresses no homicidal and no suicidal ideation.    Review of Systems  Constitutional: Negative.   HENT: Negative.   Eyes: Negative.   Respiratory: Negative.    Cardiovascular: Negative.   Gastrointestinal: Negative.   Musculoskeletal: Negative.   Skin: Negative.   Neurological: Negative.   Psychiatric/Behavioral: Positive for confusion and dysphoric mood.    Blood pressure 102/60, pulse (!) 110, temperature 98.5 F (36.9 C), temperature source Oral, resp. rate 18, height 5\' 4"  (1.626 m), weight 67.2 kg, SpO2 98 %.Body mass index is 25.43 kg/m.  General Appearance: Casual  Eye Contact:  Good  Speech:  Clear and Coherent  Volume:  Normal  Mood:  Euthymic  Affect:  Constricted  Thought Process:  Coherent  Orientation:  Full (Time, Place, and Person)  Thought Content:  Paranoid Ideation  Suicidal Thoughts:  No  Homicidal Thoughts:  No  Memory:  Immediate;   Fair Recent;   Fair Remote;   Fair  Judgement:  Fair  Insight:  Fair  Psychomotor Activity:  Decreased  Concentration:  Concentration: Fair  Recall:  [employer] of Knowledge:  Fair  Language:  Fair  Akathisia:  No  Handed:  Right  AIMS (if indicated):     Assets:  Desire for Improvement Physical Health  ADL's:  Intact  Cognition:  Impaired,  Mild  Sleep:  Number of Hours: 8.5     Treatment Plan Summary: Daily contact with patient to assess and evaluate symptoms and progress in treatment, Medication management and Plan No change to medicine.  We are getting things ready for possible discharge.  She had a negative PPD tuberculosis test, the COVID-19 test which was ordered 2 days ago evidently was  overlooked and will be done today.  I tried to introduce her to the idea that even if the people at the group home do not necessarily believe her she still can be in a safe situation.  I am not sure she quite got the idea but I hope ultimately she is able to at least feel safe there as she has seemed to be safe here at the hospital.  Alethia Berthold, MD 08/12/2019, 12:29 PM

## 2019-08-12 NOTE — Progress Notes (Signed)
Pt might be leaving tomorrow to go to group home. She had a covid test done today and waiting on results. She was calm and cooperative today all day. Collier Bullock RN

## 2019-08-13 DIAGNOSIS — F200 Paranoid schizophrenia: Secondary | ICD-10-CM

## 2019-08-13 DIAGNOSIS — F29 Unspecified psychosis not due to a substance or known physiological condition: Secondary | ICD-10-CM

## 2019-08-13 NOTE — Progress Notes (Signed)
Recreation Therapy Notes  Date: 08/13/2019  Time: 9:30 am   Location: Craft room   Behavioral response: N/A   Intervention Topic: Self-esteem  Discussion/Intervention: Patient did not attend group.   Clinical Observations/Feedback:  Patient did not attend group.   Jaculin Rasmus LRT/CTRS         Wynne Jury 08/13/2019 12:42 PM

## 2019-08-13 NOTE — Progress Notes (Signed)
Patient was in her room upon arrival to the unit.Patient.Patient denies SI/HI/AVH, anxiety and pain.Patient compliant with medication administration per MD orders.Patient given education. Patient given support and encouragement to be active in her treatment plan.Patient being monitored Q 15 minutes for safety per unit protocol. Patient remains safe on the unit.Patient observed interacting appropriately with staff and peers on the unit.

## 2019-08-13 NOTE — BHH Counselor (Signed)
CSW attempted to contact [facility] at [place] [facility] [place].  CSW was unable to speak with [name] and left a voicemail.  CSW faxed letter from Dr. Weber Cooks stating reasons why the patient is in need of guardianship.  CSW received confirmation the fax was successful, though CSW was unable to speak with [name] to confirm.  [name], MSW, LCSW 08/13/2019 11:56 AM

## 2019-08-13 NOTE — Progress Notes (Signed)
[facility] MD Progress Note  08/13/2019 3:56 PM Julia [name]  MRN:  [id number]   Subjective: Follow-up for this 19 year old female was diagnosed with psychosis.  Patient continues to make short responses to questions and today does deny having any suicidal or homicidal ideations.  Patient denies having any hallucinations.  Patient has not made any comments about paranoia or fear of anyone coming after her today which does show some improvement.  Patient reports sleeping well and is continuing to eat well.  At this time the patient denies having any other complaints or comments and states that she will come and speak to me if anything else becomes an issue.  Principal Problem: Psychosis ([place]) Diagnosis: Principal Problem:   Psychosis ([place]) Active Problems:   Polysubstance abuse ([place])   Depressed mood   Substance induced mood disorder ([place])  Total Time spent with patient: 20 minutes  Past Psychiatric History: Per patient She was ADD- diagnosed 1 month ago Record review reveals patient has hadmultiple hospitalizations.  Note seem to suggest that she started having substance abuse problems around age 16 or 18years old after her mother moved out of the house. Earlier hospitalizations mostly focused on her substance abuse problems and behavior problems. Since turning 17 she has had multiple other hospitalizations. Substance abuse still a major issue but it seems like psychotic symptoms and mood instability have been more prominent. She does have a history of suicide attempts. Has had multiple medications. Able to recall the Abilify made her have a dystonic reaction.  Past Medical History:  Past Medical History:  Diagnosis Date  . Burning with urination 05/03/2015  . Contraceptive management 05/03/2015  . Depression   . Dysmenorrhea 12/30/2013  . Heroin addiction ([place])   . Menorrhagia 12/30/2013  . Menstrual extraction 12/30/2013  . Migraines   . Social anxiety disorder 09/25/2016  .  Suicidal ideations   . Vaginal odor 05/03/2015    Past Surgical History:  Procedure Laterality Date  . NO PAST SURGERIES     Family History:  Family History  Problem Relation Age of Onset  . Depression Mother   . Hypertension Father   . Hyperlipidemia Father   . Cancer Paternal Grandmother        breast, uterine  . Cirrhosis Paternal Grandfather        due to alcohol   Family Psychiatric  History: Depression in a couple members of the family; aunt with schizophrenia Social History:  Social History   Substance and Sexual Activity  Alcohol Use Yes   Comment: BAC was clear     Social History   Substance and Sexual Activity  Drug Use Yes  . Types: Marijuana, Oxycodone   Comment: reports oxycodone and marijuana    Social History   Socioeconomic History  . Marital status: Single    Spouse name: Not on file  . Number of children: Not on file  . Years of education: Not on file  . Highest education level: Not on file  Occupational History  . Occupation: Unemployed  Tobacco Use  . Smoking status: Current Every Day Smoker    Packs/day: 0.50    Types: Cigarettes  . Smokeless tobacco: Never Used  . Tobacco comment: Not ready to quit  Substance and Sexual Activity  . Alcohol use: Yes    Comment: BAC was clear  . Drug use: Yes    Types: Marijuana, Oxycodone    Comment: reports oxycodone and marijuana  . Sexual activity: Yes    Birth  control/protection: Implant  Other Topics Concern  . Not on file  Social History Narrative   06/23/2019:  Pt stated that she is homeless, that she is a high school graduate, and that she is unemployed and not followed by any outpatient provider.      Lives with Dad. Mom has LGD, lives in [place]. 11th grader. Dog.   Social Determinants of Health   Financial Resource Strain:   . Difficulty of Paying Living Expenses: Not on file  Food Insecurity:   . Worried About Charity fundraiser in the Last Year: Not on file  . Ran Out of Food in the  Last Year: Not on file  Transportation Needs:   . Lack of Transportation (Medical): Not on file  . Lack of Transportation (Non-Medical): Not on file  Physical Activity:   . Days of Exercise per Week: Not on file  . Minutes of Exercise per Session: Not on file  Stress:   . Feeling of Stress : Not on file  Social Connections:   . Frequency of Communication with Friends and Family: Not on file  . Frequency of Social Gatherings with Friends and Family: Not on file  . Attends Religious Services: Not on file  . Active Member of Clubs or Organizations: Not on file  . Attends Archivist Meetings: Not on file  . Marital Status: Not on file   Additional Social History:                         Sleep: Good  Appetite:  Good  Current Medications: Current Facility-Administered Medications  Medication Dose Route Frequency Provider Last Rate Last Admin  . acetaminophen (TYLENOL) tablet 650 mg  650 mg Oral Q6H PRN [name], MD   650 mg at 07/29/19 1732  . alum & mag hydroxide-simeth (MAALOX/MYLANTA) 200-200-20 MG/5ML suspension 30 mL  30 mL Oral Q4H PRN [name], MD      . benztropine (COGENTIN) tablet 2 mg  2 mg Oral BID PRN [name], MD   2 mg at 08/03/19 1902   Or  . benztropine mesylate (COGENTIN) injection 2 mg  2 mg Intramuscular BID PRN [name], MD      . diphenhydrAMINE (BENADRYL) capsule 50 mg  50 mg Oral Q6H PRN [name], MD   50 mg at 08/13/19 1513   Or  . diphenhydrAMINE (BENADRYL) injection 50 mg  50 mg Intramuscular Q6H PRN [name], MD   50 mg at 08/09/19 1515  . FLUoxetine (PROZAC) capsule 40 mg  40 mg Oral Daily [name], MD   40 mg at 08/13/19 0848  . haloperidol (HALDOL) tablet 10 mg  10 mg Oral Q6H PRN Sharma Covert, MD   10 mg at 08/07/19 1109   Or  . haloperidol lactate (HALDOL) injection 10 mg  10 mg Intramuscular Q6H PRN Sharma Covert, MD   10 mg at 07/23/19 2052  . haloperidol (HALDOL)  tablet 10 mg  10 mg Oral Q1400 Sharma Covert, MD   10 mg at 08/13/19 1403  . ibuprofen (ADVIL) tablet 600 mg  600 mg Oral Q6H PRN [name], John T, MD   600 mg at 08/01/19 1120  . magnesium hydroxide (MILK OF MAGNESIA) suspension 30 mL  30 mL Oral Daily PRN [name], MD      . paliperidone (INVEGA) 24 hr tablet 3 mg  3 mg Oral  Daily Sharma Covert, MD   3 mg at 08/13/19 0848  . paliperidone (INVEGA) 24 hr tablet 9 mg  9 mg Oral QHS [name], [name], MD   9 mg at 08/11/19 2147  . traZODone (DESYREL) tablet 50 mg  50 mg Oral QHS PRN [name], MD   50 mg at 08/03/19 2137  . ziprasidone (GEODON) injection 20 mg  20 mg Intramuscular Q6H PRN Sharma Covert, MD   20 mg at 08/07/19 1207    Lab Results:  Results for orders placed or performed during the hospital encounter of 07/10/19 (from the past 48 hour(s))  SARS CORONAVIRUS 2 (TAT 6-24 HRS) Nasopharyngeal Nasopharyngeal Swab     Status: None   Collection Time: 08/12/19 10:49 AM   Specimen: Nasopharyngeal Swab  Result Value Ref Range   SARS Coronavirus 2 NEGATIVE NEGATIVE    Comment: (NOTE) SARS-CoV-2 target nucleic acids are NOT DETECTED. The SARS-CoV-2 RNA is generally detectable in upper and lower respiratory specimens during the acute phase of infection. Negative results do not preclude SARS-CoV-2 infection, do not rule out co-infections with other pathogens, and should not be used as the sole basis for treatment or other patient management decisions. Negative results must be combined with clinical observations, patient history, and epidemiological information. The expected result is Negative. Fact Sheet for Patients: SugarRoll.be Fact Sheet for Healthcare Providers: https://www.woods-mathews.com/ This test is not yet approved or cleared by the [place] FDA and  has been authorized for detection and/or diagnosis of SARS-CoV-2 by FDA under an Emergency Use  Authorization (EUA). This EUA will remain  in effect (meaning this test can be used) for the duration of the COVID-19 declaration under Section 56 4(b)(1) of the Act, 21 U.S.C. section 360bbb-3(b)(1), unless the authorization is terminated or revoked sooner. Performed at [facility], [place] [address], [place], [place] [address]     Blood Alcohol level:  Lab Results  Component Value Date   [facility] <10 07/09/2019   ETH <10 [id number]    Metabolic Disorder Labs: Lab Results  Component Value Date   HGBA1C 5.2 05/17/2019   MPG 102.54 05/17/2019   MPG 108.28 02/17/2019   Lab Results  Component Value Date   PROLACTIN 31.9 (H) 02/17/2019   PROLACTIN 29.6 (H) 09/25/2016   Lab Results  Component Value Date   CHOL 144 05/17/2019   TRIG 65 05/17/2019   HDL 35 (L) 05/17/2019   CHOLHDL 4.1 05/17/2019   VLDL 13 05/17/2019   LDLCALC 96 05/17/2019   LDLCALC 76 02/17/2019    Physical Findings: AIMS:  , ,  ,  ,    CIWA:    COWS:     Musculoskeletal: Strength & Muscle Tone: within normal limits Gait & Station: normal Patient leans: N/A  Psychiatric Specialty Exam: Physical Exam  Nursing note and vitals reviewed. Constitutional: She is oriented to person, place, and time. She appears well-developed and well-nourished.  Cardiovascular: Normal rate.  Respiratory: Effort normal.  Musculoskeletal:        General: Normal range of motion.  Neurological: She is alert and oriented to person, place, and time.  Skin: Skin is warm.    Review of Systems  Constitutional: Negative.   HENT: Negative.   Eyes: Negative.   Respiratory: Negative.   Cardiovascular: Negative.   Gastrointestinal: Negative.   Genitourinary: Negative.   Musculoskeletal: Negative.   Skin: Negative.   Neurological: Negative.   Psychiatric/Behavioral: Negative.     Blood pressure 101/68, pulse  85, temperature 98.7 F (37.1 C), temperature source Oral, resp. rate 18, height 5\' 4"  (1.626 m), weight 67.2 kg,  SpO2 97 %.Body mass index is 25.43 kg/m.  General Appearance: Casual  Eye Contact:  Good  Speech:  Clear and Coherent and Slow  Volume:  Normal  Mood:  Anxious  Affect:  Constricted  Thought Process:  Coherent  Orientation:  Full (Time, Place, and Person)  Thought Content:  Paranoid Ideation  Suicidal Thoughts:  No  Homicidal Thoughts:  No  Memory:  Immediate;   Fair Recent;   Fair Remote;   Fair  Judgement:  Fair  Insight:  Fair  Psychomotor Activity:  Decreased  Concentration:  Concentration: Fair  Recall:  [employer] of Knowledge:  Fair  Language:  Fair  Akathisia:  No  Handed:  Right  AIMS (if indicated):     Assets:  Desire for Improvement Physical Health  ADL's:  Intact  Cognition:  WNL  Sleep:  Number of Hours: 8.5   Assessment: Patient presents in her room again lying in the bed but is awake.  Patient has a blanket over her head reporting that the like coming through the windows is too bright but she is left the curtains open.  Patient is continued to be calm today and has been no outbursts or yelling or anger thus far.  Patient has been compliant with her medications and treatment.  Per social work department the patient does have placement but they are waiting for the bed to be available, but the group home has agreed to accept this patient.  Hopefully the patient will be discharged within the next 1 to 2 days but due to the weekend it may be next week before she is discharged.  Treatment Plan Summary: Daily contact with patient to assess and evaluate symptoms and progress in treatment and Medication management Continue Cogentin p.o. or IM 2 mg twice daily as needed for EPS Continue Prozac 40 mg p.o. daily for depression Continue Haldol 10 mg p.o. or IM every 6 hours as needed for agitation Continue Haldol 10 mg p.o. daily for psychosis Continue Invega 3 mg p.o. daily and 9 mg p.o. nightly for psychosis Continue trazodone 50 mg p.o. nightly as needed for  insomnia Continue every 15 minute safety checks Encourage group therapy participation  Lewis Shock, FNP 08/13/2019, 3:56 PM

## 2019-08-13 NOTE — BHH Counselor (Signed)
CSW called [facility] to follow up with [facility].  CSW was informed that staff will follow up and call back.  [name], MSW, LCSW 08/13/2019 9:42 AM

## 2019-08-13 NOTE — Progress Notes (Signed)
Patient appears pleasant and smiling with staff & peers.Patient stated that she got a place to go.Denies SI,HI and AVH.Compliant with medications.Appetite and energy level good.No aggressive behaviors noted.Support and encouragement given.

## 2019-08-13 NOTE — BHH Counselor (Signed)
CSW spoke with Delores at [facility].  She reports that the patient was not recommended for ACT services following her CCA but was recommended for CST.    Delores reports that [employer], CST Team Lead, will follow up with this CSW later today to discuss scheduling.  [name], MSW, LCSW 08/13/2019 11:50 AM

## 2019-08-13 NOTE — [facility] Group Notes (Signed)
LCSW Group Therapy Note  08/13/2019 1:00 PM  Type of Therapy and Topic:  Group Therapy:  Feelings around Relapse and Recovery  Participation Level:  None   Description of Group:    Patients in this group will discuss emotions they experience before and after a relapse. They will process how experiencing these feelings, or avoidance of experiencing them, relates to having a relapse. Facilitator will guide patients to explore emotions they have related to recovery. Patients will be encouraged to process which emotions are more powerful. They will be guided to discuss the emotional reaction significant others in their lives may have to their relapse or recovery. Patients will be assisted in exploring ways to respond to the emotions of others without this contributing to a relapse.  Therapeutic Goals: 1. Patient will identify two or more emotions that lead to a relapse for them 2. Patient will identify two emotions that result when they relapse 3. Patient will identify two emotions related to recovery 4. Patient will demonstrate ability to communicate their needs through discussion and/or role plays   Summary of Patient Progress: Patient was present at the beginning of group, however, patient did not remain in group.    Therapeutic Modalities:   Cognitive Behavioral Therapy Solution-Focused Therapy Assertiveness Training Relapse Prevention Therapy   [name], MSW, LCSW 08/13/2019 2:35 PM

## 2019-08-13 NOTE — Plan of Care (Signed)
Patient isolative and sleeping most of the time this writer is on the unit  Problem: Activity: Goal: Imbalance in normal sleep/wake cycle will improve Outcome: Not Progressing

## 2019-08-13 NOTE — Progress Notes (Signed)
CSW spoke with [name] with [facility] who reported that she had a visit from the state and was advised by licensing that she could have 4 people in the home, but then construction came to the house and stated that she could only have 3 people. Macky Lower stated that she is currently waiting to hear back from licensing regarding how many people she can have living in the group home before she takes pt in. Macky Lower stated that she is still going to take pt into the home, she just has to wait and hear from licensing. Per Macky Lower, she has reached out to other group home owners that she knows regarding letting pt stay with them until she can get everything sorted out. [name] inquired about pts medicaid and asked for financial counseling phone number regarding status of pts application and if there can be an emergency waiver for pt to expedite her medicaid application. [name] reported she would reach out to financial counseling and keep CSW updated. [name] mentioned maybe allowing pt to come stay with her at her home until she can get everything situated at the group home.   Evalina Field, MSW, LCSW Clinical Social Work 08/13/2019 10:11 AM

## 2019-08-14 DIAGNOSIS — F200 Paranoid schizophrenia: Secondary | ICD-10-CM

## 2019-08-14 MED ORDER — CELECOXIB 200 MG PO CAPS
200.0000 mg | ORAL_CAPSULE | Freq: Two times a day (BID) | ORAL | Status: AC
  Administered 2019-08-14 – 2019-08-17 (×8): 200 mg via ORAL
  Filled 2019-08-14 (×8): qty 1

## 2019-08-14 MED ORDER — GABAPENTIN 300 MG PO CAPS
300.0000 mg | ORAL_CAPSULE | Freq: Three times a day (TID) | ORAL | Status: DC
  Administered 2019-08-14 – 2019-08-23 (×27): 300 mg via ORAL
  Filled 2019-08-14 (×27): qty 1

## 2019-08-14 NOTE — Plan of Care (Signed)
  Problem: Activity: Goal: Interest or engagement in leisure activities will improve Outcome: Progressing   Problem: Coping: Goal: Coping ability will improve Outcome: Progressing Goal: Will verbalize feelings Outcome: Progressing

## 2019-08-14 NOTE — Progress Notes (Signed)
Pleasant and cooperative with care. No complaints voiced. Visible in milieu, interacting appropriately with staff and peers. Denies SI, HI. Endorses AH. Medication compliant.  Encouragement and support offered, safety checks maintained. Pt receptive and remains safe on unit with q 15 min checks.

## 2019-08-14 NOTE — [facility] Group Notes (Signed)
08/14/2019 1:00pm    Type of Therapy and Topic:  Group Therapy:  Change and Accountability  Participation Level:  Did Not Attend  Description of Group In this group, patients discussed power and accountability for change.  The group identified the challenges related to accountability and the difficulty of accepting the outcomes of negative behaviors.  Patients were encouraged to openly discuss a challenge/change they could take responsibility for.  Patients discussed the use of "change talk" and positive thinking as ways to support achievement of personal goals.  The group discussed ways to give support and empowerment to peers.  Therapeutic Goals: 1. Patients will state the relationship between personal power and accountability in the change process 2. Patients will identify the positive and negative consequences of a personal choice they have made 3. Patients will identify one challenge/choice they will take responsibility for making 4. Patients will discuss the role of "change talk" and the impact of positive thinking as it supports successful personal change 5. Patients will verbalize support and affirmation of change efforts in peers  Summary of Patient Progress:  X  Therapeutic Modalities Solution Focused Brief Therapy Motivational Interviewing Cognitive Behavioral Therapy    Julia [name], MSW, [facility] Clinical Social Worker  08/14/2019 10:54 AM

## 2019-08-14 NOTE — [facility] Group Notes (Signed)
[facility] Group Notes:  (Nursing/MHT/Case Management/Adjunct)  Date:  08/14/2019  Time:  9:19 PM  Type of Therapy:  Group Therapy  Participation Level:  Did Not Attend  Julia [name] 08/14/2019, 9:19 PM

## 2019-08-14 NOTE — Progress Notes (Signed)
[facility] MD Progress Note  08/14/2019 9:21 AM Julia [name]  MRN:  [id number] Subjective:    This is the latest of multiple admissions/healthcare system encounters for Julia [name] she is 76, she is intermittently in chronically homeless, she has a history of polysubstance abuse involving alprazolam, methamphetamines, opiates, alcohol and is positive for cannabis and her drug screen on this admission.  She presented with disorganization of thought and as her hospitalization has progressed she has become more organized but also more delusional.  The patient denies current auditory or visual hallucinations she does however report she is "being tortured" and names an individual named Julia [name] who she believes is "torturing her she further approaches the examiner while we are attempting treatment team with other patients to states she is panicking because she is "being tortured" However she remains redirectable she is oriented to person place general situation but not date.  She has no involuntary movement.  She has been on combination antipsychotic therapy, paliperidone and haloperidol without EPS or TD noted on exam  Principal Problem: Psychosis ([place]) Diagnosis: Principal Problem:   Psychosis ([place]) Active Problems:   Polysubstance abuse ([place])   Depressed mood   Substance induced mood disorder ([facility])   Schizophrenia, paranoid ([place])  Total Time spent with patient: 20 minutes  Past Psychiatric History: Extensive, it is possible she had a drug-induced psychosis that propelled her to a paranoid schizophrenic condition  Past Medical History:  Past Medical History:  Diagnosis Date  . Burning with urination 05/03/2015  . Contraceptive management 05/03/2015  . Depression   . Dysmenorrhea 12/30/2013  . Heroin addiction ([place])   . Menorrhagia 12/30/2013  . Menstrual extraction 12/30/2013  . Migraines   . Social anxiety disorder 09/25/2016  . Suicidal ideations   . Vaginal odor 05/03/2015    Past Surgical  History:  Procedure Laterality Date  . NO PAST SURGERIES     Family History:  Family History  Problem Relation Age of Onset  . Depression Mother   . Hypertension Father   . Hyperlipidemia Father   . Cancer Paternal Grandmother        breast, uterine  . Cirrhosis Paternal Grandfather        due to alcohol   Family Psychiatric  History: No data shared Social History:  Social History   Substance and Sexual Activity  Alcohol Use Yes   Comment: BAC was clear     Social History   Substance and Sexual Activity  Drug Use Yes  . Types: Marijuana, Oxycodone   Comment: reports oxycodone and marijuana    Social History   Socioeconomic History  . Marital status: Single    Spouse name: Not on file  . Number of children: Not on file  . Years of education: Not on file  . Highest education level: Not on file  Occupational History  . Occupation: Unemployed  Tobacco Use  . Smoking status: Current Every Day Smoker    Packs/day: 0.50    Types: Cigarettes  . Smokeless tobacco: Never Used  . Tobacco comment: Not ready to quit  Substance and Sexual Activity  . Alcohol use: Yes    Comment: BAC was clear  . Drug use: Yes    Types: Marijuana, Oxycodone    Comment: reports oxycodone and marijuana  . Sexual activity: Yes    Birth control/protection: Implant  Other Topics Concern  . Not on file  Social History Narrative   06/23/2019:  Pt stated that she is homeless, that she  is a high school graduate, and that she is unemployed and not followed by any outpatient provider.      Lives with Dad. Mom has LGD, lives in [place]. 11th grader. Dog.   Social Determinants of Health   Financial Resource Strain:   . Difficulty of Paying Living Expenses: Not on file  Food Insecurity:   . Worried About Charity fundraiser in the Last Year: Not on file  . Ran Out of Food in the Last Year: Not on file  Transportation Needs:   . Lack of Transportation (Medical): Not on file  . Lack of Transportation  (Non-Medical): Not on file  Physical Activity:   . Days of Exercise per Week: Not on file  . Minutes of Exercise per Session: Not on file  Stress:   . Feeling of Stress : Not on file  Social Connections:   . Frequency of Communication with Friends and Family: Not on file  . Frequency of Social Gatherings with Friends and Family: Not on file  . Attends Religious Services: Not on file  . Active Member of Clubs or Organizations: Not on file  . Attends Archivist Meetings: Not on file  . Marital Status: Not on file   Additional Social History:                         Sleep: Good  Appetite:  Good  Current Medications: Current Facility-Administered Medications  Medication Dose Route Frequency Provider Last Rate Last Admin  . acetaminophen (TYLENOL) tablet 650 mg  650 mg Oral Q6H PRN [name], MD   650 mg at 07/29/19 1732  . alum & mag hydroxide-simeth (MAALOX/MYLANTA) 200-200-20 MG/5ML suspension 30 mL  30 mL Oral Q4H PRN [name], MD      . benztropine (COGENTIN) tablet 2 mg  2 mg Oral BID PRN [name], MD   2 mg at 08/03/19 1902   Or  . benztropine mesylate (COGENTIN) injection 2 mg  2 mg Intramuscular BID PRN [name], MD      . diphenhydrAMINE (BENADRYL) capsule 50 mg  50 mg Oral Q6H PRN [name], MD   50 mg at 08/13/19 1513   Or  . diphenhydrAMINE (BENADRYL) injection 50 mg  50 mg Intramuscular Q6H PRN [name], MD   50 mg at 08/09/19 1515  . FLUoxetine (PROZAC) capsule 40 mg  40 mg Oral Daily [name], MD   40 mg at 08/14/19 0816  . haloperidol (HALDOL) tablet 10 mg  10 mg Oral Q6H PRN Sharma Covert, MD   10 mg at 08/07/19 1109   Or  . haloperidol lactate (HALDOL) injection 10 mg  10 mg Intramuscular Q6H PRN Sharma Covert, MD   10 mg at 07/23/19 2052  . ibuprofen (ADVIL) tablet 600 mg  600 mg Oral Q6H PRN [name], John T, MD   600 mg at 08/01/19 1120  . magnesium hydroxide (MILK OF MAGNESIA)  suspension 30 mL  30 mL Oral Daily PRN [name], MD      . paliperidone (INVEGA) 24 hr tablet 3 mg  3 mg Oral Daily Sharma Covert, MD   3 mg at 08/14/19 0816  . paliperidone (INVEGA) 24 hr tablet 9 mg  9 mg Oral QHS [name], John T, MD   9 mg at 08/13/19 2130  . traZODone (DESYREL) tablet 50 mg  50 mg Oral QHS PRN  [name], MD   50 mg at 08/03/19 2137  . ziprasidone (GEODON) injection 20 mg  20 mg Intramuscular Q6H PRN Sharma Covert, MD   20 mg at 08/07/19 1207    Lab Results:  Results for orders placed or performed during the hospital encounter of 07/10/19 (from the past 48 hour(s))  SARS CORONAVIRUS 2 (TAT 6-24 HRS) Nasopharyngeal Nasopharyngeal Swab     Status: None   Collection Time: 08/12/19 10:49 AM   Specimen: Nasopharyngeal Swab  Result Value Ref Range   SARS Coronavirus 2 NEGATIVE NEGATIVE    Comment: (NOTE) SARS-CoV-2 target nucleic acids are NOT DETECTED. The SARS-CoV-2 RNA is generally detectable in upper and lower respiratory specimens during the acute phase of infection. Negative results do not preclude SARS-CoV-2 infection, do not rule out co-infections with other pathogens, and should not be used as the sole basis for treatment or other patient management decisions. Negative results must be combined with clinical observations, patient history, and epidemiological information. The expected result is Negative. Fact Sheet for Patients: SugarRoll.be Fact Sheet for Healthcare Providers: https://www.woods-mathews.com/ This test is not yet approved or cleared by the [place] FDA and  has been authorized for detection and/or diagnosis of SARS-CoV-2 by FDA under an Emergency Use Authorization (EUA). This EUA will remain  in effect (meaning this test can be used) for the duration of the COVID-19 declaration under Section 56 4(b)(1) of the Act, 21 U.S.C. section 360bbb-3(b)(1), unless the authorization is  terminated or revoked sooner. Performed at [facility], [place] [address], [place], [place] [address]     Blood Alcohol level:  Lab Results  Component Value Date   [facility] <10 07/09/2019   ETH <10 [id number]    Metabolic Disorder Labs: Lab Results  Component Value Date   HGBA1C 5.2 05/17/2019   MPG 102.54 05/17/2019   MPG 108.28 02/17/2019   Lab Results  Component Value Date   PROLACTIN 31.9 (H) 02/17/2019   PROLACTIN 29.6 (H) 09/25/2016   Lab Results  Component Value Date   CHOL 144 05/17/2019   TRIG 65 05/17/2019   HDL 35 (L) 05/17/2019   CHOLHDL 4.1 05/17/2019   VLDL 13 05/17/2019   LDLCALC 96 05/17/2019   LDLCALC 76 02/17/2019    Musculoskeletal: Strength & Muscle Tone: within normal limits Gait & Station: normal Patient leans: N/A  Psychiatric Specialty Exam: Physical Exam  Review of Systems  Blood pressure 101/68, pulse 85, temperature 98.7 F (37.1 C), temperature source Oral, resp. rate 18, height 5\' 4"  (1.626 m), weight 67.2 kg, SpO2 97 %.Body mass index is 25.43 kg/m.  General Appearance: Casual  Eye Contact:  Good  Speech:  Clear and Coherent  Volume:  Normal  Mood:  Euthymic  Affect:  Non-Congruent is generally aloof  Thought Process:  Irrelevant and Descriptions of Associations: Loose  Orientation:  Other:  Person place situation not exact date  Thought Content:  Illogical, Delusions and Paranoid Ideation  Suicidal Thoughts:  No  Homicidal Thoughts:  No  Memory:  Immediate;   Fair Recent;   Fair Remote;   Fair  Judgement:  Impaired  Insight:  Shallow  Psychomotor Activity:  Normal  Concentration:  Concentration: Fair and Attention Span: Fair  Recall:  [employer] of Knowledge:  Fair  Language:  Fair  Akathisia:  Negative  Handed:  Right  AIMS (if indicated):     Assets:  Resilience Social Support  ADL's:  Intact  Cognition:  WNL  Sleep:  Number of Hours: 8     Treatment Plan Summary: Daily contact with patient to assess and  evaluate symptoms and progress in treatment and Medication management  For history of substance abuse continue to engage in rehab as well as cognitive and reality-based therapies Continue current precautions For treatment resistant psychosis continue paliperidone, use Celebrex off label as that can sometimes augment antidepressants but again patient is told this is off label we do not expect side effects however Add gabapentin for general anxiety related to her delusional believes  [name], MD 08/14/2019, 9:21 AM

## 2019-08-15 DIAGNOSIS — F200 Paranoid schizophrenia: Secondary | ICD-10-CM

## 2019-08-15 NOTE — [facility] Group Notes (Signed)
08/15/2019 2:45pm   Type of Therapy and Topic:  Group Therapy:  Change and Accountability  Participation Level:  Minimal  Description of Group In this group, patients discussed power and accountability for change.  The group identified the challenges related to accountability and the difficulty of accepting the outcomes of negative behaviors.  Patients were encouraged to openly discuss a challenge/change they could take responsibility for.  Patients discussed the use of "change talk" and positive thinking as ways to support achievement of personal goals.  The group discussed ways to give support and empowerment to peers.  Therapeutic Goals: 1. Patients will state the relationship between personal power and accountability in the change process 2. Patients will identify the positive and negative consequences of a personal choice they have made 3. Patients will identify one challenge/choice they will take responsibility for making 4. Patients will discuss the role of "change talk" and the impact of positive thinking as it supports successful personal change 5. Patients will verbalize support and affirmation of change efforts in peers  Summary of Patient Progress:  Pt came to the group on time.CSW discussed Stages of Change with the group. Pt participated in the ice breaker. Pt identified her feelings as "irritable" because "I am being tortured". Pt left shortly after she shared her feelings.   Therapeutic Modalities Solution Focused Brief Therapy Motivational Interviewing Cognitive Behavioral Therapy    Juanetta Beets, MSW, [facility] Clinical Social Worker  08/15/2019 11:51 AM

## 2019-08-15 NOTE — Plan of Care (Signed)
Patient stayed in bed resting. Medications taken to her in room. Cooperative and denying thoughts of self harm. Denying hallucinations. Reported that she was feeling tired and unable to attend wrap up group. Patient went to the dayroom to eat a snack per encouragements, then returned to bed. Currently sleeping. Safety monitored as recommended.

## 2019-08-15 NOTE — Progress Notes (Signed)
Patient just stated to this writer that "Irving Shows put a hit out on me". This Probation officer asked patient how does she know and patient stated "I keep seeing a number and it's going up and up and up".

## 2019-08-15 NOTE — Progress Notes (Signed)
Patient just came to the nurse's station and stated "they really have a hit out on me, it's for real this time". This Probation officer asked patient who is out to get her and she stated "the streets".

## 2019-08-15 NOTE — Plan of Care (Signed)
D- Patient alert and oriented. Patient presents in a pleasant mood on assessment stating that she slept ok last night and had no complaints to voice to this Probation officer. Patient denies SI, HI, AVH, and pain at this time. Patient also denies signs/symptoms of depression/axniety, stating that she is feeling "pretty good". Patient had no stated goals for today. Patient has been pacing the unit, slapping her thigh on and off throughout the day. Patient also feels that people are out to get her.  A- Scheduled medications administered to patient, per MD orders. Support and encouragement provided.  Routine safety checks conducted every 15 minutes.  Patient informed to notify staff with problems or concerns.  R- No adverse drug reactions noted. Patient contracts for safety at this time. Patient compliant with medications and treatment plan. Patient receptive, calm, and cooperative. Patient interacts well with others on the unit.  Patient remains safe at this time.  Problem: Education: Goal: Utilization of techniques to improve thought processes will improve Outcome: Not Progressing Goal: Knowledge of the prescribed therapeutic regimen will improve Outcome: Not Progressing   Problem: Activity: Goal: Interest or engagement in leisure activities will improve Outcome: Not Progressing Goal: Imbalance in normal sleep/wake cycle will improve Outcome: Not Progressing   Problem: Coping: Goal: Coping ability will improve Outcome: Not Progressing Goal: Will verbalize feelings Outcome: Not Progressing   Problem: Health Behavior/Discharge Planning: Goal: Ability to make decisions will improve Outcome: Not Progressing Goal: Compliance with therapeutic regimen will improve Outcome: Not Progressing   Problem: Role Relationship: Goal: Will demonstrate positive changes in social behaviors and relationships Outcome: Not Progressing   Problem: Safety: Goal: Ability to disclose and discuss suicidal ideas will  improve Outcome: Not Progressing Goal: Ability to identify and utilize support systems that promote safety will improve Outcome: Not Progressing   Problem: Self-Concept: Goal: Will verbalize positive feelings about self Outcome: Not Progressing Goal: Level of anxiety will decrease Outcome: Not Progressing   Problem: Education: Goal: Knowledge of the prescribed therapeutic regimen will improve Outcome: Not Progressing   Problem: Coping: Goal: Coping ability will improve Outcome: Not Progressing   Problem: Education: Goal: Knowledge of [facility] Education information/materials will improve Outcome: Not Progressing Goal: Emotional status will improve Outcome: Not Progressing

## 2019-08-15 NOTE — Progress Notes (Signed)
[facility] MD Progress Note  08/15/2019 9:45 AM Julia [name]  MRN:  [id number] Subjective:   This is the latest of multiple admissions/healthcare system encounters for Julia [name] she is 64, she is intermittently in chronically homeless, she has a history of polysubstance abuse involving alprazolam, methamphetamines, opiates, alcohol and is positive for cannabis and her drug screen on this admission.  She presented with disorganization of thought and as her hospitalization has progressed she has become more organized but also more delusional.  Patient continues to elaborate the delusions she is "being tortured" when asked if she is suffering from any auditory or visual hallucination she states that she is not hearing and seeing things but "that is the proof" so she gives contradictory information, simultaneously denying any hallucinations but at the same time reporting that is the "proof" that she is being tortured.  Again I just think this because of the disorganization of her mind in the ill formed nature of her delusions at any rate we will continue reality based therapy.  We had simplified her regimen to use only 1 antipsychotic at high dose  Principal Problem: Psychosis ([place]) Diagnosis: Principal Problem:   Psychosis ([place]) Active Problems:   Polysubstance abuse ([place])   Depressed mood   Substance induced mood disorder (HCC)   Schizophrenia, paranoid ([place])  Total Time spent with patient: 20 minutes  Past Psychiatric History: As above  Past Medical History:  Past Medical History:  Diagnosis Date  . Burning with urination 05/03/2015  . Contraceptive management 05/03/2015  . Depression   . Dysmenorrhea 12/30/2013  . Heroin addiction ([place])   . Menorrhagia 12/30/2013  . Menstrual extraction 12/30/2013  . Migraines   . Social anxiety disorder 09/25/2016  . Suicidal ideations   . Vaginal odor 05/03/2015    Past Surgical History:  Procedure Laterality Date  . NO PAST SURGERIES     Family  History:  Family History  Problem Relation Age of Onset  . Depression Mother   . Hypertension Father   . Hyperlipidemia Father   . Cancer Paternal Grandmother        breast, uterine  . Cirrhosis Paternal Grandfather        due to alcohol   Family Psychiatric  History: No new data shared Social History:  Social History   Substance and Sexual Activity  Alcohol Use Yes   Comment: BAC was clear     Social History   Substance and Sexual Activity  Drug Use Yes  . Types: Marijuana, Oxycodone   Comment: reports oxycodone and marijuana    Social History   Socioeconomic History  . Marital status: Single    Spouse name: Not on file  . Number of children: Not on file  . Years of education: Not on file  . Highest education level: Not on file  Occupational History  . Occupation: Unemployed  Tobacco Use  . Smoking status: Current Every Day Smoker    Packs/day: 0.50    Types: Cigarettes  . Smokeless tobacco: Never Used  . Tobacco comment: Not ready to quit  Substance and Sexual Activity  . Alcohol use: Yes    Comment: BAC was clear  . Drug use: Yes    Types: Marijuana, Oxycodone    Comment: reports oxycodone and marijuana  . Sexual activity: Yes    Birth control/protection: Implant  Other Topics Concern  . Not on file  Social History Narrative   06/23/2019:  Pt stated that she is homeless, that she is  a high school graduate, and that she is unemployed and not followed by any outpatient provider.      Lives with Dad. Mom has LGD, lives in [place]. 11th grader. Dog.   Social Determinants of Health   Financial Resource Strain:   . Difficulty of Paying Living Expenses: Not on file  Food Insecurity:   . Worried About Charity fundraiser in the Last Year: Not on file  . Ran Out of Food in the Last Year: Not on file  Transportation Needs:   . Lack of Transportation (Medical): Not on file  . Lack of Transportation (Non-Medical): Not on file  Physical Activity:   . Days of  Exercise per Week: Not on file  . Minutes of Exercise per Session: Not on file  Stress:   . Feeling of Stress : Not on file  Social Connections:   . Frequency of Communication with Friends and Family: Not on file  . Frequency of Social Gatherings with Friends and Family: Not on file  . Attends Religious Services: Not on file  . Active Member of Clubs or Organizations: Not on file  . Attends Archivist Meetings: Not on file  . Marital Status: Not on file   Additional Social History:                         Sleep: Fair  Appetite:  Fair  Current Medications: Current Facility-Administered Medications  Medication Dose Route Frequency Provider Last Rate Last Admin  . acetaminophen (TYLENOL) tablet 650 mg  650 mg Oral Q6H PRN [name], MD   650 mg at 07/29/19 1732  . alum & mag hydroxide-simeth (MAALOX/MYLANTA) 200-200-20 MG/5ML suspension 30 mL  30 mL Oral Q4H PRN [name], MD      . benztropine (COGENTIN) tablet 2 mg  2 mg Oral BID PRN [name], MD   2 mg at 08/03/19 1902   Or  . benztropine mesylate (COGENTIN) injection 2 mg  2 mg Intramuscular BID PRN [name], MD      . celecoxib (CELEBREX) capsule 200 mg  200 mg Oral BID [name], MD   200 mg at 08/15/19 0810  . diphenhydrAMINE (BENADRYL) capsule 50 mg  50 mg Oral Q6H PRN [name], MD   50 mg at 08/13/19 1513   Or  . diphenhydrAMINE (BENADRYL) injection 50 mg  50 mg Intramuscular Q6H PRN [name], MD   50 mg at 08/09/19 1515  . FLUoxetine (PROZAC) capsule 40 mg  40 mg Oral Daily [name], MD   40 mg at 08/15/19 0810  . gabapentin (NEURONTIN) capsule 300 mg  300 mg Oral TID [name], MD   300 mg at 08/15/19 0810  . haloperidol (HALDOL) tablet 10 mg  10 mg Oral Q6H PRN Sharma Covert, MD   10 mg at 08/07/19 1109   Or  . haloperidol lactate (HALDOL) injection 10 mg  10 mg Intramuscular Q6H PRN Sharma Covert, MD   10 mg at 07/23/19 2052  .  ibuprofen (ADVIL) tablet 600 mg  600 mg Oral Q6H PRN [name], John T, MD   600 mg at 08/01/19 1120  . magnesium hydroxide (MILK OF MAGNESIA) suspension 30 mL  30 mL Oral Daily PRN [name], MD      . paliperidone (INVEGA) 24 hr tablet 3 mg  3 mg Oral Daily Mallie Darting Cordie Grice, MD  3 mg at 08/15/19 0810  . paliperidone (INVEGA) 24 hr tablet 9 mg  9 mg Oral QHS [name], [name], MD   9 mg at 08/14/19 2158  . traZODone (DESYREL) tablet 50 mg  50 mg Oral QHS PRN [name], MD   50 mg at 08/03/19 2137  . ziprasidone (GEODON) injection 20 mg  20 mg Intramuscular Q6H PRN Sharma Covert, MD   20 mg at 08/07/19 1207    Lab Results: No results found for this or any previous visit (from the past 48 hour(s)).  Blood Alcohol level:  Lab Results  Component Value Date   ETH <10 07/09/2019   ETH <10 [id number]    Metabolic Disorder Labs: Lab Results  Component Value Date   HGBA1C 5.2 05/17/2019   MPG 102.54 05/17/2019   MPG 108.28 02/17/2019   Lab Results  Component Value Date   PROLACTIN 31.9 (H) 02/17/2019   PROLACTIN 29.6 (H) 09/25/2016   Lab Results  Component Value Date   CHOL 144 05/17/2019   TRIG 65 05/17/2019   HDL 35 (L) 05/17/2019   CHOLHDL 4.1 05/17/2019   VLDL 13 05/17/2019   LDLCALC 96 05/17/2019   LDLCALC 76 02/17/2019    Musculoskeletal: Strength & Muscle Tone: within normal limits Gait & Station: normal Patient leans: N/A  Psychiatric Specialty Exam: Physical Exam  Review of Systems  Blood pressure 106/74, pulse 89, temperature 98.8 F (37.1 C), temperature source Oral, resp. rate 18, height 5\' 4"  (1.626 m), weight 67.2 kg, SpO2 98 %.Body mass index is 25.43 kg/m.  General Appearance: Casual  Eye Contact:  Good  Speech:  Clear and Coherent  Volume:  Normal  Mood:  Euthymic  Affect:  Non-Congruent  Thought Process:  Disorganized and Descriptions of Associations: Loose  Orientation:  Full (Time, Place, and Person)  Thought Content:   Illogical, Delusions and Hallucinations: Auditory both endorsing and denying hallucinations so they are probably there  Suicidal Thoughts:  No  Homicidal Thoughts:  No  Memory:  Immediate;   Fair Recent;   Fair Remote;   Fair  Judgement:  Impaired  Insight:  Shallow  Psychomotor Activity:  Normal  Concentration:  Concentration: Fair and Attention Span: Fair  Recall:  Poor  Fund of Knowledge:  Good  Language:  Fair  Akathisia:  Negative  Handed:  Right  AIMS (if indicated):     Assets:  Leisure Time Physical Health Resilience  ADL's:  Intact  Cognition:  WNL  Sleep:  Number of Hours: 8   Treatment Plan Summary: Daily contact with patient to assess and evaluate symptoms and progress in treatment and Medication management  Continue reality based therapy and reassurance there is no evidence she is being "tortured" continue to monitor for safety 15-minute precautions Continue Invega at 12 mg a day    [name], MD 08/15/2019, 9:45 AM

## 2019-08-16 NOTE — Progress Notes (Signed)
D- Patient is observed resting quietly in bed.  Patient appears to be sleep upon assessment and has remained sleep for the duration of this shift.  Respirations even and unlabored, color satisfactory. No complaints.   A- Routine safety checks conducted every 15 minutes.   R- Patient compliant with medications and treatment plan. Patient receptive, calm, and cooperative.  Patient remains safe at this time.

## 2019-08-16 NOTE — Progress Notes (Signed)
[facility] MD Progress Note  08/16/2019 6:47 PM Julia [name]  MRN:  [id number] Subjective: Follow-up for this 19 year old with complex psychotic symptoms.  She came to me this afternoon to tell me "I am having flashbacks".  I asked her to describe them and she said they were of "the devil".  She has not been showing any behavior today that would be expected of a person seeing the devil.  She has been her usual flat and withdrawn.  She takes care of her hygiene adequately.  Goes to some groups.  Eats okay.  Has not had any behavior problems today that I am aware of. Principal Problem: Psychosis ([place]) Diagnosis: Principal Problem:   Psychosis ([place]) Active Problems:   Polysubstance abuse ([name])   Depressed mood   Substance induced mood disorder (HCC)   Schizophrenia, paranoid ([place])  Total Time spent with patient: 30 minutes  Past Psychiatric History: Past history of substance abuse psychotic symptoms and what it is looking like a general settling into being institutionalized  Past Medical History:  Past Medical History:  Diagnosis Date  . Burning with urination 05/03/2015  . Contraceptive management 05/03/2015  . Depression   . Dysmenorrhea 12/30/2013  . Heroin addiction ([place])   . Menorrhagia 12/30/2013  . Menstrual extraction 12/30/2013  . Migraines   . Social anxiety disorder 09/25/2016  . Suicidal ideations   . Vaginal odor 05/03/2015    Past Surgical History:  Procedure Laterality Date  . NO PAST SURGERIES     Family History:  Family History  Problem Relation Age of Onset  . Depression Mother   . Hypertension Father   . Hyperlipidemia Father   . Cancer Paternal Grandmother        breast, uterine  . Cirrhosis Paternal Grandfather        due to alcohol   Family Psychiatric  History: See previous Social History:  Social History   Substance and Sexual Activity  Alcohol Use Yes   Comment: BAC was clear     Social History   Substance and Sexual Activity  Drug Use Yes  .  Types: Marijuana, Oxycodone   Comment: reports oxycodone and marijuana    Social History   Socioeconomic History  . Marital status: Single    Spouse name: Not on file  . Number of children: Not on file  . Years of education: Not on file  . Highest education level: Not on file  Occupational History  . Occupation: Unemployed  Tobacco Use  . Smoking status: Current Every Day Smoker    Packs/day: 0.50    Types: Cigarettes  . Smokeless tobacco: Never Used  . Tobacco comment: Not ready to quit  Substance and Sexual Activity  . Alcohol use: Yes    Comment: BAC was clear  . Drug use: Yes    Types: Marijuana, Oxycodone    Comment: reports oxycodone and marijuana  . Sexual activity: Yes    Birth control/protection: Implant  Other Topics Concern  . Not on file  Social History Narrative   06/23/2019:  Pt stated that she is homeless, that she is a high school graduate, and that she is unemployed and not followed by any outpatient provider.      Lives with Dad. Mom has LGD, lives in [place]. 11th grader. Dog.   Social Determinants of Health   Financial Resource Strain:   . Difficulty of Paying Living Expenses: Not on file  Food Insecurity:   . Worried About Charity fundraiser  in the Last Year: Not on file  . Ran Out of Food in the Last Year: Not on file  Transportation Needs:   . Lack of Transportation (Medical): Not on file  . Lack of Transportation (Non-Medical): Not on file  Physical Activity:   . Days of Exercise per Week: Not on file  . Minutes of Exercise per Session: Not on file  Stress:   . Feeling of Stress : Not on file  Social Connections:   . Frequency of Communication with Friends and Family: Not on file  . Frequency of Social Gatherings with Friends and Family: Not on file  . Attends Religious Services: Not on file  . Active Member of Clubs or Organizations: Not on file  . Attends Archivist Meetings: Not on file  . Marital Status: Not on file    Additional Social History:                         Sleep: Fair  Appetite:  Fair  Current Medications: Current Facility-Administered Medications  Medication Dose Route Frequency Provider Last Rate Last Admin  . acetaminophen (TYLENOL) tablet 650 mg  650 mg Oral Q6H PRN [name], MD   650 mg at 07/29/19 1732  . alum & mag hydroxide-simeth (MAALOX/MYLANTA) 200-200-20 MG/5ML suspension 30 mL  30 mL Oral Q4H PRN [name], MD      . benztropine (COGENTIN) tablet 2 mg  2 mg Oral BID PRN [name], MD   2 mg at 08/03/19 1902   Or  . benztropine mesylate (COGENTIN) injection 2 mg  2 mg Intramuscular BID PRN [name], MD      . celecoxib (CELEBREX) capsule 200 mg  200 mg Oral BID [name], MD   200 mg at 08/16/19 1702  . diphenhydrAMINE (BENADRYL) capsule 50 mg  50 mg Oral Q6H PRN [name], MD   50 mg at 08/16/19 1518   Or  . diphenhydrAMINE (BENADRYL) injection 50 mg  50 mg Intramuscular Q6H PRN [name], MD   50 mg at 08/09/19 1515  . FLUoxetine (PROZAC) capsule 40 mg  40 mg Oral Daily [name], MD   40 mg at 08/16/19 0759  . gabapentin (NEURONTIN) capsule 300 mg  300 mg Oral TID [name], MD   300 mg at 08/16/19 1702  . haloperidol (HALDOL) tablet 10 mg  10 mg Oral Q6H PRN Sharma Covert, MD   10 mg at 08/16/19 1518   Or  . haloperidol lactate (HALDOL) injection 10 mg  10 mg Intramuscular Q6H PRN Sharma Covert, MD   10 mg at 07/23/19 2052  . ibuprofen (ADVIL) tablet 600 mg  600 mg Oral Q6H PRN Aikam Vinje T, MD   600 mg at 08/01/19 1120  . magnesium hydroxide (MILK OF MAGNESIA) suspension 30 mL  30 mL Oral Daily PRN [name], MD      . paliperidone (INVEGA) 24 hr tablet 3 mg  3 mg Oral Daily Sharma Covert, MD   3 mg at 08/16/19 0759  . paliperidone (INVEGA) 24 hr tablet 9 mg  9 mg Oral QHS Eladia Frame, [name], MD   Stopped at 08/16/19 [phone]  . traZODone (DESYREL) tablet 50 mg  50 mg Oral QHS PRN  [name], MD   50 mg at 08/03/19 2137  . ziprasidone (GEODON) injection 20 mg  20 mg Intramuscular Q6H PRN Clary,  Cordie Grice, MD   20 mg at 08/07/19 1207    Lab Results: No results found for this or any previous visit (from the past 86 hour(s)).  Blood Alcohol level:  Lab Results  Component Value Date   ETH <10 07/09/2019   ETH <10 [id number]    Metabolic Disorder Labs: Lab Results  Component Value Date   HGBA1C 5.2 05/17/2019   MPG 102.54 05/17/2019   MPG 108.28 02/17/2019   Lab Results  Component Value Date   PROLACTIN 31.9 (H) 02/17/2019   PROLACTIN 29.6 (H) 09/25/2016   Lab Results  Component Value Date   CHOL 144 05/17/2019   TRIG 65 05/17/2019   HDL 35 (L) 05/17/2019   CHOLHDL 4.1 05/17/2019   VLDL 13 05/17/2019   LDLCALC 96 05/17/2019   LDLCALC 76 02/17/2019    Physical Findings: AIMS:  , ,  ,  ,    CIWA:    COWS:     Musculoskeletal: Strength & Muscle Tone: within normal limits Gait & Station: normal Patient leans: N/A  Psychiatric Specialty Exam: Physical Exam  Nursing note and vitals reviewed. Constitutional: She appears well-developed and well-nourished.  HENT:  Head: Normocephalic and atraumatic.  Eyes: Pupils are equal, round, and reactive to light. Conjunctivae are normal.  Cardiovascular: Regular rhythm and normal heart sounds.  Respiratory: Effort normal. No respiratory distress.  GI: Soft.  Musculoskeletal:        General: Normal range of motion.     Cervical back: Normal range of motion.  Neurological: She is alert.  Skin: Skin is warm and dry.  Psychiatric: Her affect is blunt. Her speech is delayed. She is slowed. Thought content is delusional. Cognition and memory are impaired. She expresses inappropriate judgment. She expresses no homicidal and no suicidal ideation.    Review of Systems  Constitutional: Negative.   HENT: Negative.   Eyes: Negative.   Respiratory: Negative.   Cardiovascular: Negative.    Gastrointestinal: Negative.   Musculoskeletal: Negative.   Skin: Negative.   Neurological: Negative.   Psychiatric/Behavioral: Positive for hallucinations.    Blood pressure 96/68, pulse 94, temperature 98.2 F (36.8 C), temperature source Oral, resp. rate 18, height 5\' 4"  (1.626 m), weight 67.2 kg, SpO2 98 %.Body mass index is 25.43 kg/m.  General Appearance: Casual  Eye Contact:  Fair  Speech:  Slow  Volume:  Decreased  Mood:  Dysphoric  Affect:  Flat  Thought Process:  Coherent  Orientation:  Full (Time, Place, and Person)  Thought Content:  Illogical  Suicidal Thoughts:  No  Homicidal Thoughts:  No  Memory:  Immediate;   Fair Recent;   Fair Remote;   Fair  Judgement:  Impaired  Insight:  Shallow  Psychomotor Activity:  Decreased  Concentration:  Concentration: Poor  Recall:  Poor  Fund of Knowledge:  Fair  Language:  Fair  Akathisia:  No  Handed:  Right  AIMS (if indicated):     Assets:  Desire for Improvement Physical Health  ADL's:  Impaired  Cognition:  Impaired,  Mild  Sleep:  Number of Hours: 8     Treatment Plan Summary: Daily contact with patient to assess and evaluate symptoms and progress in treatment, Medication management and Plan I think that often she says things about symptoms without even knowing what she is saying.  What ever it is she is describing it does not seem like flashbacks and it is hard to believe that she is having any new or distressing symptoms from  her behavior today.  I encouraged her to please put these thoughts aside and try to focus on the here and now in reality.  Her behavior has been better since we have been keeping her on stable antipsychotic medicine.  We are still working on 1 of 2 discharge plans  Alethia Berthold, MD 08/16/2019, 6:47 PM

## 2019-08-16 NOTE — Progress Notes (Signed)
   08/16/19 1400  Clinical Encounter Type  Visited With Patient  Visit Type Follow-up  Referral From Chaplain  Consult/Referral To Chaplain  Chaplain had a short visit with patient, asked how patient was feeling and she said better. Chaplain offered pastoral presence and empathy.

## 2019-08-16 NOTE — Plan of Care (Signed)
Patient had one episode of yelling out at the hallway states "I am tired of being here.I have flash backs."PO PRN medications given.No aggressive behaviors noted.Compliant with medications.Denies SI,HI and AVH.Appetite and energy level good.Support and encouragement given.

## 2019-08-16 NOTE — BHH Counselor (Signed)
CSW spoke with [name] at the group home the patient is possibly going to. [name] had questions on whether the patient could qualify for emergency Medicaid and if the pt's address was [place] to [place] or [facility] if this would speed the process.  CSW asked for clarification on the issue with the number of occupants allowed in the home as stated by the state vs construction.  [name] reports that this is now cleared up. [name] reports that the new issue is Medicaid, she would like the pt to go to a day program M-F 8:30AM to 3:30PM and the program requires the patient to have Medicaid.  CSW reports that she will follow up with Financial Counseling.  CSW spoke with Suanne Marker in Financial Counseling who reports that emergency medicaid is reserved for "the undocumented if they need emergency surgery or something".  She reports that since pt does not have children to qualify for Medicaid she will have to meet criteria for "disabled".  She reports that the initial Medicaid process can take up 90 days.  She reports that she doesn't know how to speed the process up.  CSW attempted to call [name] back to relay the information but got voicemail.  CSW left HIPAA compliant voicemail.  [name], MSW, LCSW 08/16/2019 2:13 PM

## 2019-08-16 NOTE — BHH Group Notes (Signed)
Overcoming Obstacles  08/16/2019 1PM  Type of Therapy and Topic:  Group Therapy:  Overcoming Obstacles  Participation Level:  Minimal    Description of Group:    In this group patients will be encouraged to explore what they see as obstacles to their own wellness and recovery. They will be guided to discuss their thoughts, feelings, and behaviors related to these obstacles. The group will process together ways to cope with barriers, with attention given to specific choices patients can make. Each patient will be challenged to identify changes they are motivated to make in order to overcome their obstacles. This group will be process-oriented, with patients participating in exploration of their own experiences as well as giving and receiving support and challenge from other group members.   Therapeutic Goals: 1. Patient will identify personal and current obstacles as they relate to admission. 2. Patient will identify barriers that currently interfere with their wellness or overcoming obstacles.  3. Patient will identify feelings, thought process and behaviors related to these barriers. 4. Patient will identify two changes they are willing to make to overcome these obstacles:      Summary of Patient Progress Minimal participation and no input during session. However, pt did participate in group activity.   Therapeutic Modalities:   Cognitive Behavioral Therapy Solution Focused Therapy Motivational Interviewing Relapse Prevention Therapy    Sanjuana Kava, MSW, LCSW 08/16/2019 1:54 PM

## 2019-08-16 NOTE — Progress Notes (Signed)
Recreation Therapy Notes   Date: 08/16/2019  Time: 9:30 am   Location: Craft room   Behavioral response: N/A   Intervention Topic: Necessities   Discussion/Intervention: Patient did not attend group.   Clinical Observations/Feedback:  Patient did not attend group.   Ava Tangney LRT/CTRS        Shelbi Vaccaro 08/16/2019 11:47 AM

## 2019-08-16 NOTE — BHH Counselor (Signed)
CSW called [name] [phone].  CSW left HIPAA compliant voivemail.  [name], MSW, LCSW 08/16/2019 1:43 PM

## 2019-08-16 NOTE — Progress Notes (Signed)
Slept 8.5 hours

## 2019-08-16 NOTE — BHH Counselor (Signed)
CSW called [name] [phone] again in an attempt to confirm receipt of letter from Dr. Weber Cooks.  CSW was also attempting to see if records from pt's care coordinator need to be sent to [facility] as well.    CSW was unable to speak with [name] again and left a message requesting a return phone call.   CSW also called [name] [phone] and left a HIPAA compliant voicemail.    Both work for the [employer], MSW, LCSW 08/16/2019 1:46 PM

## 2019-08-17 NOTE — Progress Notes (Signed)
Patient phone interview went reportedly by patient as, "I think good". Shortly after patient interview with group home however, patient proceeded to randomly become severely elevated, agitated, and upset while walking away from nursing station down the hallway. Pt. Engaged by staff to offer support and encouragement and to encourage the patient to utilize coping skills to calm down. Pt. Proceeded to continue to yell and after sometime began to yell at staff. Pt. During this time is loose in her associations and tangential. Pt. Redirected to her room and encouraged again to utilize coping skills. Pt. Able to be redirected to her room thus far and calm down and utilize coping skills in her room while talking to staff. Pt. At and around this time expressing several paranoid statements such as, "we all know that somebody is after me!" and "I cannot be the only one who just heard that!".

## 2019-08-17 NOTE — Plan of Care (Addendum)
D: Pt during assessments is pleasant and cooperative, overall engagement though is minimal. Pt. Denies si/hi/avh, able to verbalize safety on the unit. Of note, the patient is observed while mostly appropriate, she does at times laugh to herself during our conversation inappropriately. Pt. Denies physical pain. Pt. Denies paranoia. Pt. Reports her mood as, "good".   A: Q x 15 minute observation checks in place/maintained for safety. Patient was and is provided with education throughout shift when appropriate.  Patient was and will be given/offered medications per orders. Patient was and is encouraged to attend groups, participate in unit activities and continue with plan of care. Pt. Chart and plans of care reviewed. Pt. Given support and encouragement when appropriate.    R: Patient is complaint with medication and unit procedures thus far. Pt. Observed eating good. Pt. Has been observed appropriate on the unit with staff and peers.  Pt. Educated on group home interview today at 1430 that patient is some-what happy about she verbalizes.     Problem: Education: Goal: Utilization of techniques to improve thought processes will improve Outcome: Progressing Goal: Knowledge of the prescribed therapeutic regimen will improve Outcome: Progressing   Problem: Activity: Goal: Interest or engagement in leisure activities will improve Outcome: Progressing Goal: Imbalance in normal sleep/wake cycle will improve Outcome: Progressing   Problem: Coping: Goal: Coping ability will improve Outcome: Progressing Goal: Will verbalize feelings Outcome: Progressing   Problem: Health Behavior/Discharge Planning: Goal: Ability to make decisions will improve Outcome: Progressing Goal: Compliance with therapeutic regimen will improve Outcome: Progressing   Problem: Role Relationship: Goal: Will demonstrate positive changes in social behaviors and relationships Outcome: Progressing   Problem: Safety: Goal:  Ability to disclose and discuss suicidal ideas will improve Outcome: Progressing Goal: Ability to identify and utilize support systems that promote safety will improve Outcome: Progressing   Problem: Self-Concept: Goal: Will verbalize positive feelings about self Outcome: Progressing Goal: Level of anxiety will decrease Outcome: Progressing   Problem: Education: Goal: Knowledge of the prescribed therapeutic regimen will improve Outcome: Progressing   Problem: Coping: Goal: Coping ability will improve Outcome: Progressing   Problem: Education: Goal: Knowledge of [facility] Education information/materials will improve Outcome: Progressing Goal: Emotional status will improve Outcome: Progressing

## 2019-08-17 NOTE — Progress Notes (Signed)
9 hours of sleep.

## 2019-08-17 NOTE — Tx Team (Signed)
Interdisciplinary Treatment and Diagnostic Plan Update  08/17/2019 Time of Session: 8:30am Julia [name] MRN: [id number]  Principal Diagnosis: Psychosis [facility])  Secondary Diagnoses: Principal Problem:   Psychosis ([place]) Active Problems:   Polysubstance abuse ([place])   Depressed mood   Substance induced mood disorder ([place])   Schizophrenia, paranoid ([place])   Current Medications:  Current Facility-Administered Medications  Medication Dose Route Frequency Provider Last Rate Last Admin  . acetaminophen (TYLENOL) tablet 650 mg  650 mg Oral Q6H PRN [name], MD   650 mg at 08/17/19 0650  . alum & mag hydroxide-simeth (MAALOX/MYLANTA) 200-200-20 MG/5ML suspension 30 mL  30 mL Oral Q4H PRN [name], MD      . benztropine (COGENTIN) tablet 2 mg  2 mg Oral BID PRN [name], MD   2 mg at 08/03/19 1902   Or  . benztropine mesylate (COGENTIN) injection 2 mg  2 mg Intramuscular BID PRN [name], MD      . celecoxib (CELEBREX) capsule 200 mg  200 mg Oral BID [name], MD   200 mg at 08/17/19 0800  . diphenhydrAMINE (BENADRYL) capsule 50 mg  50 mg Oral Q6H PRN [name], MD   50 mg at 08/16/19 1518   Or  . diphenhydrAMINE (BENADRYL) injection 50 mg  50 mg Intramuscular Q6H PRN [name], MD   50 mg at 08/09/19 1515  . FLUoxetine (PROZAC) capsule 40 mg  40 mg Oral Daily [name], MD   40 mg at 08/17/19 0800  . gabapentin (NEURONTIN) capsule 300 mg  300 mg Oral TID [name], MD   300 mg at 08/17/19 1111  . haloperidol (HALDOL) tablet 10 mg  10 mg Oral Q6H PRN Sharma Covert, MD   10 mg at 08/16/19 1518   Or  . haloperidol lactate (HALDOL) injection 10 mg  10 mg Intramuscular Q6H PRN Sharma Covert, MD   10 mg at 07/23/19 2052  . ibuprofen (ADVIL) tablet 600 mg  600 mg Oral Q6H PRN [name], John T, MD   600 mg at 08/01/19 1120  . magnesium hydroxide (MILK OF MAGNESIA) suspension 30 mL  30 mL Oral Daily PRN [name], MD      .  paliperidone (INVEGA) 24 hr tablet 3 mg  3 mg Oral Daily Sharma Covert, MD   3 mg at 08/17/19 0800  . paliperidone (INVEGA) 24 hr tablet 9 mg  9 mg Oral QHS [name], John T, MD   9 mg at 08/16/19 2000  . traZODone (DESYREL) tablet 50 mg  50 mg Oral QHS PRN [name], MD   50 mg at 08/03/19 2137  . ziprasidone (GEODON) injection 20 mg  20 mg Intramuscular Q6H PRN Sharma Covert, MD   20 mg at 08/07/19 1207   PTA Medications: Medications Prior to Admission  Medication Sig Dispense Refill Last Dose  . etonogestrel (NEXPLANON) 68 MG IMPL implant 1 each (68 mg total) by Subdermal route once for 1 dose. (Birth control method) 1 each 0   . FLUoxetine (PROZAC) 10 MG capsule Take 3 capsules (30 mg total) by mouth daily. 90 capsule 1   . hydrOXYzine (ATARAX/VISTARIL) 25 MG tablet Take 1 tablet (25 mg total) by mouth every 6 (six) hours as needed for anxiety. 60 tablet 1   . QUEtiapine (SEROQUEL) 50 MG tablet Take 1 tablet (50 mg total) by mouth daily. 30 tablet 1   . traZODone (  DESYREL) 50 MG tablet Take 50 mg by mouth at bedtime as needed. for sleep       Patient Stressors: Financial difficulties Loss of Mother Marital or family conflict Substance abuse  Patient Strengths: Ability for insight Communication skills  Treatment Modalities: Medication Management, Group therapy, Case management,  1 to 1 session with clinician, Psychoeducation, Recreational therapy.   Physician Treatment Plan for Primary Diagnosis: Psychosis ([place]) Long Term Goal(s): Improvement in symptoms so as ready for discharge Improvement in symptoms so as ready for discharge   Short Term Goals: Ability to verbalize feelings will improve Ability to disclose and discuss suicidal ideas Ability to demonstrate self-control will improve Ability to identify and develop effective coping behaviors will improve Compliance with prescribed medications will improve  Medication Management: Evaluate patient's response,  side effects, and tolerance of medication regimen.  Therapeutic Interventions: 1 to 1 sessions, Unit Group sessions and Medication administration.  Evaluation of Outcomes: Progressing  Physician Treatment Plan for Secondary Diagnosis: Principal Problem:   Psychosis ([place]) Active Problems:   Polysubstance abuse ([place])   Depressed mood   Substance induced mood disorder ([facility])   Schizophrenia, paranoid ([place])  Long Term Goal(s): Improvement in symptoms so as ready for discharge Improvement in symptoms so as ready for discharge   Short Term Goals: Ability to verbalize feelings will improve Ability to disclose and discuss suicidal ideas Ability to demonstrate self-control will improve Ability to identify and develop effective coping behaviors will improve Compliance with prescribed medications will improve     Medication Management: Evaluate patient's response, side effects, and tolerance of medication regimen.  Therapeutic Interventions: 1 to 1 sessions, Unit Group sessions and Medication administration.  Evaluation of Outcomes: Progressing   RN Treatment Plan for Primary Diagnosis: Psychosis ([place]) Long Term Goal(s): Knowledge of disease and therapeutic regimen to maintain health will improve  Short Term Goals: Ability to participate in decision making will improve, Ability to verbalize feelings will improve, Ability to disclose and discuss suicidal ideas, Ability to identify and develop effective coping behaviors will improve and Compliance with prescribed medications will improve  Medication Management: RN will administer medications as ordered by provider, will assess and evaluate patient's response and provide education to patient for prescribed medication. RN will report any adverse and/or side effects to prescribing provider.  Therapeutic Interventions: 1 on 1 counseling sessions, Psychoeducation, Medication administration, Evaluate responses to treatment, Monitor vital signs and  CBGs as ordered, Perform/monitor CIWA, COWS, AIMS and Fall Risk screenings as ordered, Perform wound care treatments as ordered.  Evaluation of Outcomes: Progressing   LCSW Treatment Plan for Primary Diagnosis: Psychosis ([place]) Long Term Goal(s): Safe transition to appropriate next level of care at discharge, Engage patient in therapeutic group addressing interpersonal concerns.  Short Term Goals: Engage patient in aftercare planning with referrals and resources and Increase skills for wellness and recovery  Therapeutic Interventions: Assess for all discharge needs, 1 to 1 time with Social worker, Explore available resources and support systems, Assess for adequacy in community support network, Educate family and significant other(s) on suicide prevention, Complete Psychosocial Assessment, Interpersonal group therapy.  Evaluation of Outcomes: Progressing   Progress in Treatment: Attending groups: Yes. Participating in groups: No. Taking medication as prescribed: Yes. Toleration medication: Yes. Family/Significant other contact made: No, will contact:  pt declined spe; with pt Patient understands diagnosis: Yes. Discussing patient identified problems/goals with staff: Yes. Medical problems stabilized or resolved: Yes. Denies suicidal/homicidal ideation: Yes. Issues/concerns per patient self-inventory: No. Other:   New problem(s)  identified: No, Describe:  None  New Short Term/Long Term Goal(s): Medication stabilization, elimination of SI thoughts, and development of a comprehensive mental wellness plan.   Patient Goals:    "to have a plan"  Update 07/27/2019: No changes at this time. Update 07/31/2019: No changes at this time Update 08/12/19: No change at this time.  Discharge Plan or Barriers: Patient is currently homeless.  Patient aftercare depends on where patient will be staying. CSW is attempting to identify an available bed at local homeless shelters.  Patient reports that she can  not return to [facility]. Update 07/22/19-Pt displaying psychotic behavior. No other social supports identified. CSW's attempted to contact pt boyfriend and father. Awaiting response from boyfriend and pt father lives in a nursing home.  Discharge plan TBD Update 07/27/2019:  Patient continues to adjust to her medications. She continues to have some delusions in regards of being a part of witness protection and having the police locate housing for her. Patient is homeless with no supports from home to assist with housing.  Application for [employer] in [place] has been sent.  No word yet on possible housing.  Patient can not stay with father, return to [facility] and calls to her boyfriend have not been returned. Update 07/31/2019: CSW messaged Ana Leamon at [place] about obtaining medicaid for the patient and helping her through the process of receiving medicaid so that patient can have more opportunities for aftercare and services. Update 08/12/19:Pt is homeless. Pt has been referred to a group home and is agreeable with this decision, awaiting decision. APS report made while pt hospitalized, DSS referred pt for guardianship, awaiting decision. Medicaid application started while pt hospitalized, awaiting decision. Update 08/17/19: Pt has been accepted to a group home, but due to lack of medicaid it is on hold because pt cannot attend the day program without medicaid in place. Medicaid decision should be made by 09/18/19. CSW will reach out to other group homes in the area to see if they will accept pt with a LOG.  Reason for Continuation of Hospitalization: Delusions  Medication stabilization  Estimated Length of Stay: TBD   Attendees: Patient:  08/07/2019  Physician: Alethia Berthold 08/07/2019   Nursing:  08/07/2019   RN Care Manager: 08/07/2019   Social Worker: [facility] 08/07/2019   Recreational Therapist:  08/07/2019   Other:  08/07/2019   Other:   08/07/2019   Other: 08/07/2019       Scribe for Treatment Team: Mariann Laster Mohini Heathcock, LCSWA 08/17/2019 11:40 AM

## 2019-08-17 NOTE — Progress Notes (Signed)
CSW completed [facility] referral information and faxed it and supporting documentation to [facility]. Fax was successful. CSW contacted [facility] to confirm they received it and they reported they did.  Evalina Field, MSW, LCSW Clinical Social Work 08/17/2019 2:28 PM

## 2019-08-17 NOTE — Progress Notes (Signed)
D- Patient is observed resting quietly in bed.  Patient appears to be sleep upon assessment and has remained sleep for the duration of this shift.    A- Routine safety checks conducted every 15 minutes.   R- Patient compliant with medications and treatment plan. Patient receptive, calm, and cooperative.  Patient remains safe at this time.

## 2019-08-17 NOTE — Progress Notes (Signed)
Recreation Therapy Notes   Date: 08/17/2019  Time: 9:30 am   Location: Craft room   Behavioral response: N/A   Intervention Topic: Self-care   Discussion/Intervention: Patient did not attend group.   Clinical Observations/Feedback:  Patient did not attend group.   Shayde Gervacio LRT/CTRS        Lashaya Kienitz 08/17/2019 11:58 AM

## 2019-08-17 NOTE — B[facility] Counselor (Signed)
CSW called Minna Antis, [phone].  CSW left HIPAA compliant voicemail.  [name], MSW, LCSW 08/17/2019 3:30 PM

## 2019-08-17 NOTE — Progress Notes (Signed)
CSW spoke with Virgil Benedict from [place] to inquire about status of pts guardianship application. Barrie Lyme reported "I spoke with Michaela last week and I informed her that it is a 60 day process". Barrie Lyme reported that they transferred the case to [facility] as that is where pt resides. Barrie Lyme did not have a contact person for who is assigned pts case reporting that she spoke with the supervisor there and she is unsure how they are going to handle the case, but she sent all the information she has to [place]. Barrie Lyme stated she did receive the letter written by Dr. Weber Cooks and that was sent over as well. Barrie Lyme asked if CSW could send pts medical records over. CSW informed her that she has to go through medical records to obtain those. Barrie Lyme reported "For future reference it slows up the process when you guys don't send them and we have to wait for medical records". Barrie Lyme reported she requested pts records last week and have not received them. Barrie Lyme stated that she does not like "professional texting" and stated she received a tect from [name] and she asked him to have a SW reach out to her today. Barrie Lyme stated she did not answer due to being in court all morning.   CSW spoke with [name] who stated that she cannot accept pt with the LOG due to that not allowing pt to go to the day program. Macky Lower stated that she spoke with Josepha Pigg who will take pt into her home and gave [name] CSW contact information and that [name] would call CSW to discuss it.  Josepha Pigg called CSW inquiring about pts finances. CSW explained pt has no income and hospital will do LOG to pay room and board at [facility] base rate until pts medicaid is approved. [name] asked if pt has SSI, CSW reported no and [name] stated that pt will need SSI as well and reported if they accept pt, they will need all her medical records so they can start the SSI process. [name] scheduled phone interview  with pt for today 08/17/19 at 2:30.   Evalina Field, MSW, LCSW Clinical Social Work 08/17/2019 1:52 PM

## 2019-08-17 NOTE — BHH Group Notes (Signed)

## 2019-08-17 NOTE — Progress Notes (Signed)
CSW lvm for [place] her that hospital is willing to do a LOG for pt at [facility] base rate and asked for [name] to give CSW a call back.  Evalina Field, MSW, LCSW Clinical Social Work 08/17/2019 11:47 AM

## 2019-08-17 NOTE — Progress Notes (Signed)
CSW completed screening with [facility] for pt. They reported it would be sent over to the nurse and CSW will receive a call back if they have any more questions.   Evalina Field, MSW, LCSW Clinical Social Work 08/17/2019 3:31 PM

## 2019-08-17 NOTE — Progress Notes (Signed)
[facility] MD Progress Note  08/17/2019 4:13 PM Julia [name]  MRN:  [id number] Subjective: Patient seen and case discussed with treatment team. She is clinically stable at this time and is need of placement. Patient will need assistance with wrap around services. Upon chart review this her 6th admission within 6 months. Her admissions are generally extended length of stay due to placement issues. Discussed with patient ACT team services, boarding house, and transitional housing. Patient is at baseline at this time, and will need ACT team services if discharged into the community. Patient is interviewing for a group home as well as seeking additional services from [employer]. She had an appointment for screening at a [facility] on THursday,  but that was the best we could do is there are so many limited resources now due to the [place] outbreak.  So at this point in time she is predominantly here for housing purposes by her own admission he denies current suicidal thoughts plans or intent, has a history of malingering.   She is engaging well with her peers and staff. She denies suicidal ideation and homicidal ideations at this time. She does not appear to be responding to internal stimuli at this, and has not displayed any psychotic symptoms. However she is observed being disruptive on the unit, and screaming loudly. Upon clarification she states she is being tortured and it is being swept under the rug. While talking to the patient she calms her self down and is easily directed, it is unclear if this behavior is factitious. She continues to have periods of delusions that are sporadic and suddenly appear. She is seen moments later walking down the hall talking to staff. She is eating and sleeping well and denies any disturbances at this time.   Principal Problem: Psychosis ([place]) Diagnosis: Principal Problem:   Psychosis ([place]) Active Problems:   Polysubstance abuse ([place])   Depressed mood   Substance induced  mood disorder (HCC)   Schizophrenia, paranoid ([place])  Total Time spent with patient: 1 hour  Past Psychiatric History: Past history of substance abuse psychotic symptoms and what it is looking like a general settling into being institutionalized  Past Medical History:  Past Medical History:  Diagnosis Date  . Burning with urination 05/03/2015  . Contraceptive management 05/03/2015  . Depression   . Dysmenorrhea 12/30/2013  . Heroin addiction ([place])   . Menorrhagia 12/30/2013  . Menstrual extraction 12/30/2013  . Migraines   . Social anxiety disorder 09/25/2016  . Suicidal ideations   . Vaginal odor 05/03/2015    Past Surgical History:  Procedure Laterality Date  . NO PAST SURGERIES     Family History:  Family History  Problem Relation Age of Onset  . Depression Mother   . Hypertension Father   . Hyperlipidemia Father   . Cancer Paternal Grandmother        breast, uterine  . Cirrhosis Paternal Grandfather        due to alcohol   Family Psychiatric  History: See previous Social History:  Social History   Substance and Sexual Activity  Alcohol Use Yes   Comment: BAC was clear     Social History   Substance and Sexual Activity  Drug Use Yes  . Types: Marijuana, Oxycodone   Comment: reports oxycodone and marijuana    Social History   Socioeconomic History  . Marital status: Single    Spouse name: Not on file  . Number of children: Not on file  .  Years of education: Not on file  . Highest education level: Not on file  Occupational History  . Occupation: Unemployed  Tobacco Use  . Smoking status: Current Every Day Smoker    Packs/day: 0.50    Types: Cigarettes  . Smokeless tobacco: Never Used  . Tobacco comment: Not ready to quit  Substance and Sexual Activity  . Alcohol use: Yes    Comment: BAC was clear  . Drug use: Yes    Types: Marijuana, Oxycodone    Comment: reports oxycodone and marijuana  . Sexual activity: Yes    Birth control/protection: Implant   Other Topics Concern  . Not on file  Social History Narrative   06/23/2019:  Pt stated that she is homeless, that she is a high school graduate, and that she is unemployed and not followed by any outpatient provider.      Lives with Dad. Mom has LGD, lives in [place]. 11th grader. Dog.   Social Determinants of Health   Financial Resource Strain:   . Difficulty of Paying Living Expenses: Not on file  Food Insecurity:   . Worried About Charity fundraiser in the Last Year: Not on file  . Ran Out of Food in the Last Year: Not on file  Transportation Needs:   . Lack of Transportation (Medical): Not on file  . Lack of Transportation (Non-Medical): Not on file  Physical Activity:   . Days of Exercise per Week: Not on file  . Minutes of Exercise per Session: Not on file  Stress:   . Feeling of Stress : Not on file  Social Connections:   . Frequency of Communication with Friends and Family: Not on file  . Frequency of Social Gatherings with Friends and Family: Not on file  . Attends Religious Services: Not on file  . Active Member of Clubs or Organizations: Not on file  . Attends Archivist Meetings: Not on file  . Marital Status: Not on file   Additional Social History:                         Sleep: Fair  Appetite:  Fair  Current Medications: Current Facility-Administered Medications  Medication Dose Route Frequency Provider Last Rate Last Admin  . acetaminophen (TYLENOL) tablet 650 mg  650 mg Oral Q6H PRN [name], MD   650 mg at 08/17/19 0650  . alum & mag hydroxide-simeth (MAALOX/MYLANTA) 200-200-20 MG/5ML suspension 30 mL  30 mL Oral Q4H PRN [name], MD      . benztropine (COGENTIN) tablet 2 mg  2 mg Oral BID PRN [name], MD   2 mg at 08/03/19 1902   Or  . benztropine mesylate (COGENTIN) injection 2 mg  2 mg Intramuscular BID PRN [name], MD      . celecoxib (CELEBREX) capsule 200 mg  200 mg Oral BID [name], MD   200  mg at 08/17/19 0800  . diphenhydrAMINE (BENADRYL) capsule 50 mg  50 mg Oral Q6H PRN [name], MD   50 mg at 08/16/19 1518   Or  . diphenhydrAMINE (BENADRYL) injection 50 mg  50 mg Intramuscular Q6H PRN [name], MD   50 mg at 08/09/19 1515  . FLUoxetine (PROZAC) capsule 40 mg  40 mg Oral Daily [name], MD   40 mg at 08/17/19 0800  . gabapentin (NEURONTIN) capsule 300 mg  300 mg Oral TID Jake Samples,  Aaron Edelman, MD   300 mg at 08/17/19 1111  . haloperidol (HALDOL) tablet 10 mg  10 mg Oral Q6H PRN Sharma Covert, MD   10 mg at 08/16/19 1518   Or  . haloperidol lactate (HALDOL) injection 10 mg  10 mg Intramuscular Q6H PRN Sharma Covert, MD   10 mg at 07/23/19 2052  . ibuprofen (ADVIL) tablet 600 mg  600 mg Oral Q6H PRN [name], John T, MD   600 mg at 08/01/19 1120  . magnesium hydroxide (MILK OF MAGNESIA) suspension 30 mL  30 mL Oral Daily PRN [name], MD      . paliperidone (INVEGA) 24 hr tablet 3 mg  3 mg Oral Daily Sharma Covert, MD   3 mg at 08/17/19 0800  . paliperidone (INVEGA) 24 hr tablet 9 mg  9 mg Oral QHS [name], John T, MD   9 mg at 08/16/19 2000  . traZODone (DESYREL) tablet 50 mg  50 mg Oral QHS PRN [name], MD   50 mg at 08/03/19 2137  . ziprasidone (GEODON) injection 20 mg  20 mg Intramuscular Q6H PRN Sharma Covert, MD   20 mg at 08/07/19 1207    Lab Results: No results found for this or any previous visit (from the past 48 hour(s)).  Blood Alcohol level:  Lab Results  Component Value Date   ETH <10 07/09/2019   ETH <10 [id number]    Metabolic Disorder Labs: Lab Results  Component Value Date   HGBA1C 5.2 05/17/2019   MPG 102.54 05/17/2019   MPG 108.28 02/17/2019   Lab Results  Component Value Date   PROLACTIN 31.9 (H) 02/17/2019   PROLACTIN 29.6 (H) 09/25/2016   Lab Results  Component Value Date   CHOL 144 05/17/2019   TRIG 65 05/17/2019   HDL 35 (L) 05/17/2019   CHOLHDL 4.1 05/17/2019   VLDL 13 05/17/2019    LDLCALC 96 05/17/2019   LDLCALC 76 02/17/2019    Physical Findings: AIMS:  , ,  ,  ,    CIWA:    COWS:     Musculoskeletal: Strength & Muscle Tone: within normal limits Gait & Station: normal Patient leans: N/A  Psychiatric Specialty Exam: Physical Exam  Nursing note and vitals reviewed. Constitutional: She appears well-developed and well-nourished.  HENT:  Head: Normocephalic and atraumatic.  Eyes: Pupils are equal, round, and reactive to light. Conjunctivae are normal.  Cardiovascular: Regular rhythm and normal heart sounds.  Respiratory: Effort normal. No respiratory distress.  GI: Soft.  Musculoskeletal:        General: Normal range of motion.     Cervical back: Normal range of motion.  Neurological: She is alert.  Skin: Skin is warm and dry.  Psychiatric: Her affect is blunt. Her speech is delayed. She is slowed. Thought content is delusional. Cognition and memory are impaired. She expresses inappropriate judgment. She expresses no homicidal and no suicidal ideation.    Review of Systems  Constitutional: Negative.   HENT: Negative.   Eyes: Negative.   Respiratory: Negative.   Cardiovascular: Negative.   Gastrointestinal: Negative.   Musculoskeletal: Negative.   Skin: Negative.   Neurological: Negative.   Psychiatric/Behavioral: Positive for hallucinations.    Blood pressure 96/68, pulse 94, temperature 98.2 F (36.8 C), temperature source Oral, resp. rate 18, height 5\' 4"  (1.626 m), weight 67.2 kg, SpO2 98 %.Body mass index is 25.43 kg/m.  General Appearance: Casual  Eye Contact:  Fair  Speech:  Slow  Volume:  Decreased  Mood:  Dysphoric  Affect:  Flat  Thought Process:  Coherent  Orientation:  Full (Time, Place, and Person)  Thought Content:  Logical  Suicidal Thoughts:  Denies  Homicidal Thoughts:  Denies  Memory:  Immediate;   Fair Recent;   Fair Remote;   Fair  Judgement:  Poor  Insight:  Lacking  Psychomotor Activity:  Decreased  Concentration:   Concentration: Poor and Attention Span: Poor  Recall:  Poor  Fund of Knowledge:  Fair  Language:  Fair  Akathisia:  No  Handed:  Right  AIMS (if indicated):     Assets:  Desire for Improvement Physical Health  ADL's:  Impaired  Cognition:  Impaired,  Mild  Sleep:  Number of Hours: 8     Treatment Plan Summary: Daily contact with patient to assess and evaluate symptoms and progress in treatment, Medication management and Plan Continue with discharge planning. Patient appears to be at baseline. Can attempt referral to ACT team for possible assistance in the community.   Suella Broad, FNP 08/17/2019, 4:13 PM

## 2019-08-18 MED ORDER — PALIPERIDONE PALMITATE ER 156 MG/ML IM SUSY
156.0000 mg | PREFILLED_SYRINGE | INTRAMUSCULAR | Status: DC
  Administered 2019-08-18: 156 mg via INTRAMUSCULAR
  Filled 2019-08-18: qty 1

## 2019-08-18 MED ORDER — TRAZODONE HCL 50 MG PO TABS
50.0000 mg | ORAL_TABLET | Freq: Every day | ORAL | Status: DC
  Administered 2019-08-18 – 2019-08-24 (×3): 50 mg via ORAL
  Filled 2019-08-18 (×3): qty 1

## 2019-08-18 MED ORDER — BENZTROPINE MESYLATE 1 MG PO TABS
1.0000 mg | ORAL_TABLET | Freq: Two times a day (BID) | ORAL | Status: DC
  Administered 2019-08-18 – 2019-08-25 (×15): 1 mg via ORAL
  Filled 2019-08-18 (×15): qty 1

## 2019-08-18 MED ORDER — PALIPERIDONE ER 3 MG PO TB24
6.0000 mg | ORAL_TABLET | Freq: Every day | ORAL | Status: DC
  Administered 2019-08-18 – 2019-08-20 (×2): 6 mg via ORAL
  Filled 2019-08-18: qty 2

## 2019-08-18 MED ORDER — HALOPERIDOL DECANOATE 100 MG/ML IM SOLN
50.0000 mg | INTRAMUSCULAR | Status: DC
  Administered 2019-08-18: 50 mg via INTRAMUSCULAR
  Filled 2019-08-18 (×2): qty 0.5

## 2019-08-18 NOTE — Plan of Care (Signed)
  Problem: Safety: Goal: Ability to disclose and discuss suicidal ideas will improve Outcome: Progressing Goal: Ability to identify and utilize support systems that promote safety will improve Outcome: Progressing

## 2019-08-18 NOTE — BHH Counselor (Signed)
CSW returned the phone call from [place], Mickel Baas [phone].  She reports that once the patient has obtained living arrangements give her call because that will determine which ACT team would work with the pt, the [employer] team or [employer].  [name], MSW, LCSW 08/18/2019 2:16 PM

## 2019-08-18 NOTE — NC FL2 (Signed)
[place] LEVEL OF CARE SCREENING TOOL     IDENTIFICATION  Patient Name: Julia [name] Birthdate: Nov 22, 2000 Sex: female Admission Date (Current Location): 07/10/2019  [place] and [place] Number:  Engineering geologist and Address:  [facility], [address] [place], [place] [address]      Provider Number: [id number]  Attending Physician Name and Address:  [name], [name], MD  Relative Name and Phone Number:       Current Level of Care: Hospital Recommended Level of Care: [facility], Other (Comment)(group home) Prior Approval Number:    Date Approved/Denied:   PASRR Number:    Discharge Plan: Other (Comment)(group home or [facility])    Current Diagnoses: Patient Active Problem List   Diagnosis Date Noted  . Schizophrenia, paranoid ([place])   . Mood disorder with psychosis ([place]) 05/16/2019  . Cocaine dependence with cocaine-induced psychotic disorder with complication ([place])   . Psychosis ([place]) 05/15/2019  . MDD (major depressive disorder), recurrent, severe, with psychosis ([place]) 02/16/2019  . Bipolar 1 disorder, depressed, severe ([place]) 01/06/2018  . Polysubstance dependence including opioid type drug, continuous use ([place]) 01/06/2018  . Petechiae 10/09/2017  . Social anxiety disorder 09/25/2016  . MDD (major depressive disorder), recurrent episode ([place]) 09/24/2016  . Substance induced mood disorder ([place]) 04/29/2016  . Depressed mood 04/17/2016  . Polysubstance abuse ([place])   . Recurrent UTI (urinary tract infection) postcoital 08/21/2015  . Dysmenorrhea 12/30/2013    Orientation RESPIRATION BLADDER Height & Weight     Self, Place, Time  Normal Continent Weight: 148 lb 2.4 oz (67.2 kg) Height:  5\' 4"  (162.6 cm)  BEHAVIORAL SYMPTOMS/MOOD NEUROLOGICAL BOWEL NUTRITION STATUS  Other (Comment)(Verbal outburst at times) (none reported) Continent Diet(normal)  AMBULATORY STATUS COMMUNICATION OF NEEDS Skin   Independent Verbally  Normal                       Personal Care Assistance Level of Assistance  (none reported)           Functional Limitations Info  (none reported)          SPECIAL CARE FACTORS FREQUENCY  (none reported)                    Contractures Contractures Info: Not present    Additional Factors Info  Code Status, Allergies Code Status Info: FULL Allergies Info: Abilify, Zoloft           Current Medications (08/18/2019):  This is the current hospital active medication list Current Facility-Administered Medications  Medication Dose Route Frequency Provider Last Rate Last Admin  . benztropine (COGENTIN) tablet 1 mg  1 mg Oral BID [name], John T, MD      . FLUoxetine (PROZAC) capsule 40 mg  40 mg Oral Daily [name], MD   40 mg at 08/18/19 0855  . gabapentin (NEURONTIN) capsule 300 mg  300 mg Oral TID [name], MD   300 mg at 08/18/19 0855  . haloperidol decanoate (HALDOL DECANOATE) 100 MG/ML injection 50 mg  50 mg Intramuscular Q30 days [name], John T, MD      . ibuprofen (ADVIL) tablet 600 mg  600 mg Oral Q6H PRN [name], [name], MD   600 mg at 08/01/19 1120  . paliperidone (INVEGA SUSTENNA) injection 156 mg  156 mg Intramuscular Q30 days [name], John T, MD      . paliperidone (INVEGA) 24 hr tablet 6 mg  6  mg Oral QHS [name], John T, MD      . traZODone (DESYREL) tablet 50 mg  50 mg Oral QHS [name], [name], MD         Discharge Medications: Please see discharge summary for a list of discharge medications.  Relevant Imaging Results:  Relevant Lab Results:   Additional [place], LCSW

## 2019-08-18 NOTE — Plan of Care (Signed)
D- Patient alert and oriented. Patient presents in a pleasant mood on assessment stating that she slept "fair" last night and had no complaints or concerns to voice to this Probation officer. Patient denies depression/anxiety. Patient also denies SI, HI, AVH, and pain at this time. Patient's goal for today is "no goal/discharge".  A- Scheduled medications administered to patient, per MD orders. Support and encouragement provided.  Routine safety checks conducted every 15 minutes.  Patient informed to notify staff with problems or concerns.  R- No adverse drug reactions noted. Patient contracts for safety at this time. Patient compliant with medications and treatment plan. Patient receptive, calm, and cooperative. Patient interacts well with others on the unit.  Patient remains safe at this time.  Problem: Education: Goal: Utilization of techniques to improve thought processes will improve Outcome: Progressing Goal: Knowledge of the prescribed therapeutic regimen will improve Outcome: Progressing   Problem: Activity: Goal: Interest or engagement in leisure activities will improve Outcome: Progressing Goal: Imbalance in normal sleep/wake cycle will improve Outcome: Progressing   Problem: Coping: Goal: Coping ability will improve Outcome: Progressing Goal: Will verbalize feelings Outcome: Progressing   Problem: Health Behavior/Discharge Planning: Goal: Ability to make decisions will improve Outcome: Progressing Goal: Compliance with therapeutic regimen will improve Outcome: Progressing   Problem: Role Relationship: Goal: Will demonstrate positive changes in social behaviors and relationships Outcome: Progressing   Problem: Safety: Goal: Ability to disclose and discuss suicidal ideas will improve Outcome: Progressing Goal: Ability to identify and utilize support systems that promote safety will improve Outcome: Progressing   Problem: Self-Concept: Goal: Will verbalize positive feelings  about self Outcome: Progressing Goal: Level of anxiety will decrease Outcome: Progressing   Problem: Education: Goal: Knowledge of the prescribed therapeutic regimen will improve Outcome: Progressing   Problem: Coping: Goal: Coping ability will improve Outcome: Progressing   Problem: Education: Goal: Knowledge of [place] [facility] Education information/materials will improve Outcome: Progressing Goal: Emotional status will improve Outcome: Progressing

## 2019-08-18 NOTE — Progress Notes (Signed)
Patient extremely drowsy this shift. Did not get up for snacks, writer took patients medications to room. Denies any SI, HI, AVH.  Writer encouraged patient to come and get snack. She refused. Encouragement and support offered. Safety checks maintained. Medications given as prescribed. Pt receptive and remains safe on unit with q 15 min checks.

## 2019-08-18 NOTE — Progress Notes (Signed)
Patient was in her room upon arrival to the unit.Patient denies SI/HI/AVH, anxiety and pain.Patientdidn't get up for medications and refused them when this writer asked about her taking them.Patient given education. Patient given support and encouragement to be active in her treatment plan.Patient being monitored Q 15 minutes for safety per unit protocol. Patient remains safe on the unit.Patient observed interacting appropriately with staff and peers on the unit.

## 2019-08-18 NOTE — Plan of Care (Signed)
Patient isolative and sleeping most of the shift when this writer is here  Problem: Activity: Goal: Imbalance in normal sleep/wake cycle will improve Outcome: Not Progressing

## 2019-08-18 NOTE — BHH Counselor (Addendum)
CSW returned call to Flolencio at [facility].  CSW was unable to speak with Flo and left HIPAA compliant voicemail.  [name], MSW, LCSW 08/18/2019 2:18 PM

## 2019-08-18 NOTE — BHH Counselor (Signed)
  CSW did received a return call from [facility]. He would like to schedule the pt for an intake appoitment on 08/23/19 at 10:30AM.  He will send his email to this CSW so that a WebEx can be set up.  [name], MSW, LCSW 08/18/2019 2:28 PM

## 2019-08-18 NOTE — BHH Group Notes (Signed)
LCSW Group Therapy Note  08/18/2019 12:12 PM  Type of Therapy/Topic:  Group Therapy:  Emotion Regulation  Participation Level:  None   Description of Group:   The purpose of this group is to assist patients in learning to regulate negative emotions and experience positive emotions. Patients will be guided to discuss ways in which they have been vulnerable to their negative emotions. These vulnerabilities will be juxtaposed with experiences of positive emotions or situations, and patients will be challenged to use positive emotions to combat negative ones. Special emphasis will be placed on coping with negative emotions in conflict situations, and patients will process healthy conflict resolution skills.  Therapeutic Goals: 1. Patient will identify two positive emotions or experiences to reflect on in order to balance out negative emotions 2. Patient will label two or more emotions that they find the most difficult to experience 3. Patient will demonstrate positive conflict resolution skills through discussion and/or role plays  Summary of Patient Progress: Pt was present in group, then left, and came back halfway through group. Pt did not participate in the group discussion at all.   Therapeutic Modalities:   Cognitive Behavioral Therapy Feelings Identification Dialectical Behavioral Therapy   Evalina Field, MSW, LCSW Clinical Social Work 08/18/2019 12:12 PM

## 2019-08-18 NOTE — Progress Notes (Signed)
[facility] MD Progress Note  08/18/2019 2:48 PM Julia SPROULL  MRN:  [id number] Subjective: Follow-up for this young woman with psychotic disorder probably I think schizophrenia.  Patient seen chart reviewed.  Patient continues to mostly be flat and withdrawn.  Has not had any outbursts today.  Only occasionally now has spells of shouting and crying and has not been aggressive to anyone.  She appears to be tolerating her medicine okay.  Yesterday she was able to participate in an interview with a owner of a local group home.  Today I spoke with the owner of the group home and reviewed the patient's medications.  Based on what works best for them and what I think probably is really necessary for sero-I made a few adjustments to her medicine.  Today is the 28th day since her last Invega injection so we will go ahead with another 1 of those and also decrease the oral dose of Invega.  Haldol did seem to be helpful for her as well and she is still having psychotic symptoms so we will go ahead and use also a small dose of Haldol decanoate.  Patient's father called and the patient gave me permission to return the call.  Father reports that the patient still calls him with the same delusion she will talk about on the phone here.  I talked about the diagnosis of schizophrenia and what we are trying to do to keep her safe. Principal Problem: Psychosis ([place]) Diagnosis: Principal Problem:   Psychosis ([place]) Active Problems:   Polysubstance abuse ([place])   Depressed mood   Substance induced mood disorder (HCC)   Schizophrenia, paranoid ([place])  Total Time spent with patient: 30 minutes  Past Psychiatric History: Long history of substance abuse and behavior problems going back to early adolescence  Past Medical History:  Past Medical History:  Diagnosis Date  . Burning with urination 05/03/2015  . Contraceptive management 05/03/2015  . Depression   . Dysmenorrhea 12/30/2013  . Heroin addiction ([place])   . Menorrhagia  12/30/2013  . Menstrual extraction 12/30/2013  . Migraines   . Social anxiety disorder 09/25/2016  . Suicidal ideations   . Vaginal odor 05/03/2015    Past Surgical History:  Procedure Laterality Date  . NO PAST SURGERIES     Family History:  Family History  Problem Relation Age of Onset  . Depression Mother   . Hypertension Father   . Hyperlipidemia Father   . Cancer Paternal Grandmother        breast, uterine  . Cirrhosis Paternal Grandfather        due to alcohol   Family Psychiatric  History: It sounds like there is been a significant family history that the patient's mother had mental illness and actually committed suicide and that some other people in the mother's family going back a couple generations have had mental illness.  Tangentially I would say that this at least raises the possibility I think that the patient could have Huntington's and while it is probably not worth doing the testing just for her current mental illness if she develops more neurologic signs I would definitely think about it. Social History:  Social History   Substance and Sexual Activity  Alcohol Use Yes   Comment: BAC was clear     Social History   Substance and Sexual Activity  Drug Use Yes  . Types: Marijuana, Oxycodone   Comment: reports oxycodone and marijuana    Social History   Socioeconomic History  .  Marital status: Single    Spouse name: Not on file  . Number of children: Not on file  . Years of education: Not on file  . Highest education level: Not on file  Occupational History  . Occupation: Unemployed  Tobacco Use  . Smoking status: Current Every Day Smoker    Packs/day: 0.50    Types: Cigarettes  . Smokeless tobacco: Never Used  . Tobacco comment: Not ready to quit  Substance and Sexual Activity  . Alcohol use: Yes    Comment: BAC was clear  . Drug use: Yes    Types: Marijuana, Oxycodone    Comment: reports oxycodone and marijuana  . Sexual activity: Yes    Birth  control/protection: Implant  Other Topics Concern  . Not on file  Social History Narrative   06/23/2019:  Pt stated that she is homeless, that she is a high school graduate, and that she is unemployed and not followed by any outpatient provider.      Lives with Dad. Mom has LGD, lives in [place]. 11th grader. Dog.   Social Determinants of Health   Financial Resource Strain:   . Difficulty of Paying Living Expenses: Not on file  Food Insecurity:   . Worried About Charity fundraiser in the Last Year: Not on file  . Ran Out of Food in the Last Year: Not on file  Transportation Needs:   . Lack of Transportation (Medical): Not on file  . Lack of Transportation (Non-Medical): Not on file  Physical Activity:   . Days of Exercise per Week: Not on file  . Minutes of Exercise per Session: Not on file  Stress:   . Feeling of Stress : Not on file  Social Connections:   . Frequency of Communication with Friends and Family: Not on file  . Frequency of Social Gatherings with Friends and Family: Not on file  . Attends Religious Services: Not on file  . Active Member of Clubs or Organizations: Not on file  . Attends Archivist Meetings: Not on file  . Marital Status: Not on file   Additional Social History:                         Sleep: Fair  Appetite:  Fair  Current Medications: Current Facility-Administered Medications  Medication Dose Route Frequency Provider Last Rate Last Admin  . benztropine (COGENTIN) tablet 1 mg  1 mg Oral BID Lenford Beddow T, MD      . FLUoxetine (PROZAC) capsule 40 mg  40 mg Oral Daily [name], MD   40 mg at 08/18/19 0855  . gabapentin (NEURONTIN) capsule 300 mg  300 mg Oral TID [name], MD   300 mg at 08/18/19 1205  . haloperidol decanoate (HALDOL DECANOATE) 100 MG/ML injection 50 mg  50 mg Intramuscular Q30 days Algie Westry T, MD   50 mg at 08/18/19 1411  . ibuprofen (ADVIL) tablet 600 mg  600 mg Oral Q6H PRN Leeon Makar, [name], MD    600 mg at 08/18/19 1412  . paliperidone (INVEGA SUSTENNA) injection 156 mg  156 mg Intramuscular Q30 days Donabelle Molden, [name], MD   156 mg at 08/18/19 1410  . paliperidone (INVEGA) 24 hr tablet 6 mg  6 mg Oral QHS Armistead Sult T, MD      . traZODone (DESYREL) tablet 50 mg  50 mg Oral QHS Kenlie Seki, [name], MD  Lab Results: No results found for this or any previous visit (from the past 48 hour(s)).  Blood Alcohol level:  Lab Results  Component Value Date   ETH <10 07/09/2019   ETH <10 [id number]    Metabolic Disorder Labs: Lab Results  Component Value Date   HGBA1C 5.2 05/17/2019   MPG 102.54 05/17/2019   MPG 108.28 02/17/2019   Lab Results  Component Value Date   PROLACTIN 31.9 (H) 02/17/2019   PROLACTIN 29.6 (H) 09/25/2016   Lab Results  Component Value Date   CHOL 144 05/17/2019   TRIG 65 05/17/2019   HDL 35 (L) 05/17/2019   CHOLHDL 4.1 05/17/2019   VLDL 13 05/17/2019   LDLCALC 96 05/17/2019   LDLCALC 76 02/17/2019    Physical Findings: AIMS:  , ,  ,  ,    CIWA:    COWS:     Musculoskeletal: Strength & Muscle Tone: within normal limits Gait & Station: normal Patient leans: N/A  Psychiatric Specialty Exam: Physical Exam  Nursing note and vitals reviewed. Constitutional: She appears well-developed and well-nourished.  HENT:  Head: Normocephalic and atraumatic.  Eyes: Pupils are equal, round, and reactive to light. Conjunctivae are normal.  Cardiovascular: Regular rhythm and normal heart sounds.  Respiratory: Effort normal.  GI: Soft.  Musculoskeletal:        General: Normal range of motion.     Cervical back: Normal range of motion.  Neurological: She is alert.  Skin: Skin is warm and dry.  Psychiatric: Her affect is blunt. Her speech is delayed. She is slowed. Thought content is paranoid. Cognition and memory are impaired. She expresses inappropriate judgment.    Review of Systems  Constitutional: Negative.   HENT: Negative.   Eyes: Negative.    Respiratory: Negative.   Cardiovascular: Negative.   Gastrointestinal: Negative.   Musculoskeletal: Negative.   Skin: Negative.   Neurological: Negative.   Psychiatric/Behavioral: The patient is nervous/anxious.     Blood pressure 115/83, pulse 93, temperature 98.6 F (37 C), temperature source Oral, resp. rate 17, height 5\' 4"  (1.626 m), weight 67.2 kg, SpO2 97 %.Body mass index is 25.43 kg/m.  General Appearance: Casual  Eye Contact:  Minimal  Speech:  Slow  Volume:  Decreased  Mood:  Euthymic  Affect:  Blunt  Thought Process:  Disorganized  Orientation:  Full (Time, Place, and Person)  Thought Content:  Illogical, Delusions and Paranoid Ideation  Suicidal Thoughts:  No  Homicidal Thoughts:  No  Memory:  Immediate;   Fair Recent;   Poor Remote;   Fair  Judgement:  Impaired  Insight:  Shallow  Psychomotor Activity:  Decreased  Concentration:  Concentration: Fair  Recall:  [employer] of Knowledge:  Fair  Language:  Fair  Akathisia:  No  Handed:  Right  AIMS (if indicated):     Assets:  Desire for Improvement Physical Health  ADL's:  Impaired  Cognition:  Impaired,  Mild  Sleep:  Number of Hours: 9     Treatment Plan Summary: Daily contact with patient to assess and evaluate symptoms and progress in treatment, Medication management and Plan Medication simplification with focus on antipsychotics and minimizing as needed's.  Patient's behavior is fairly stable although she still clearly has some psychotic symptoms.  We are hoping that we may be able to find an arrangement to get her into a group home before too long.  Meanwhile continue with treatment as usual.  Alethia Berthold, MD 08/18/2019, 2:48 PM

## 2019-08-18 NOTE — Progress Notes (Signed)
CSW received a voicemail from [place] with [facility] who stated they needed more information. CSW contacted [place] and spoke with Pamala Hurry who reported that they need SW notes from the results of pts group home interview she had on  08/10/19. Pamala Hurry also stated "If you all are looking for a group home placement, she does not need a bed here".   Evalina Field, MSW, LCSW Clinical Social Work 08/18/2019 9:26 AM

## 2019-08-18 NOTE — Progress Notes (Signed)
CSW contacted Josepha Pigg [phone] and asked what would be a good time for the doctor to give her a call as yesterday the phone kept ringing. [name] reported that the doctor could call at 11am today. CSW notified Dr Weber Cooks.    Evalina Field, MSW, LCSW Clinical Social Work 08/18/2019 9:27 AM

## 2019-08-18 NOTE — Progress Notes (Signed)
Recreation Therapy Notes  Date: 08/18/2019  Time: 9:30 am  Location: Craft room  Behavioral response: Appropriate  Intervention Topic: Happiness  Discussion/Intervention:  Group content today was focused on Happiness. The group defined happiness and described where happiness comes from. Individuals identified what makes them happy and how they go about making others happy. Patients expressed things that stop them from being happy and ways they can improve their happiness. The group stated reasons why it is important to be happy. The group participated in the intervention "My Happiness", where they had a chance to identify and express things that make them happy. Clinical Observations/Feedback:  Participant came to group and defined happiness as joy.  Individual was social with peers and staff while participating in the intervention.   Julia [name] LRT/CTRS         Julia [name] 08/18/2019 1:17 PM

## 2019-08-19 MED ORDER — MENTHOL 3 MG MT LOZG
1.0000 | LOZENGE | OROMUCOSAL | Status: DC | PRN
  Administered 2019-08-19 (×2): 3 mg via ORAL
  Filled 2019-08-19: qty 9

## 2019-08-19 NOTE — Plan of Care (Signed)
D- Patient alert and oriented. Patient presents in a pleasant mood on assessment stating that she slept ok and had no complaints to voice to this Probation officer. Patient denies anxiety, however, endorses "a little bit: of depression, stating "I don't know how to describe it". Patient also denies SI, HI, AVH, and pain at this time. Patient's goal for today is "discharging".  A- Scheduled medications administered to patient, per MD orders. Support and encouragement provided.  Routine safety checks conducted every 15 minutes.  Patient informed to notify staff with problems or concerns.  R- No adverse drug reactions noted. Patient contracts for safety at this time. Patient compliant with medications and treatment plan. Patient receptive, calm, and cooperative. Patient interacts well with others on the unit.  Patient remains safe at this time.  Problem: Education: Goal: Utilization of techniques to improve thought processes will improve Outcome: Progressing Goal: Knowledge of the prescribed therapeutic regimen will improve Outcome: Progressing   Problem: Activity: Goal: Interest or engagement in leisure activities will improve Outcome: Progressing Goal: Imbalance in normal sleep/wake cycle will improve Outcome: Progressing   Problem: Coping: Goal: Coping ability will improve Outcome: Progressing Goal: Will verbalize feelings Outcome: Progressing   Problem: Health Behavior/Discharge Planning: Goal: Ability to make decisions will improve Outcome: Progressing Goal: Compliance with therapeutic regimen will improve Outcome: Progressing   Problem: Role Relationship: Goal: Will demonstrate positive changes in social behaviors and relationships Outcome: Progressing   Problem: Safety: Goal: Ability to disclose and discuss suicidal ideas will improve Outcome: Progressing Goal: Ability to identify and utilize support systems that promote safety will improve Outcome: Progressing   Problem:  Self-Concept: Goal: Will verbalize positive feelings about self Outcome: Progressing Goal: Level of anxiety will decrease Outcome: Progressing   Problem: Education: Goal: Knowledge of the prescribed therapeutic regimen will improve Outcome: Progressing   Problem: Coping: Goal: Coping ability will improve Outcome: Progressing   Problem: Education: Goal: Knowledge of [place] Education information/materials will improve Outcome: Progressing Goal: Emotional status will improve Outcome: Progressing

## 2019-08-19 NOTE — Progress Notes (Signed)
2/3 1600: Supervisor spoke with Josepha Pigg, [phone], group home owner regarding LOG for Julia [name].  [name] willing to accept [name] and is able to file the disability application herself in order for the LOG to be available. Supervisor then spoke with [facility] Director [name], who said he would be willing to provide LOG that could be renewed under these circumstances.  2/4: 0845: Supervisor spoke with Josepha Pigg and confirmed the above plan.  [name] will accept pt but said she will not be able to receive her until Monday.  Supervisor asked her to consider date of tomorrow, but [name] was skeptical. [name], MSW, LCSW Advanced Care Supervisor 08/19/2019 8:52 AM

## 2019-08-19 NOTE — Progress Notes (Signed)
[facility] MD Progress Note  08/19/2019 3:51 PM Julia [name]  MRN:  [id number] Subjective: Follow-up for this young woman with schizophrenia history of substance abuse chronic poor social functioning major social difficulties.  Patient is mostly behaving herself okay.  For the most part she interacts with others appropriately but at times will still voice odd complaints such as believing that some person is controlling her mind.  She still occasionally will have episodes of shouting but less frequently than previously.  We had been hoping that arrangements were ready for discharge to a group home but learned today that all of that has fallen through and there is now no place for her to go again.  Patient complained today of having a sore throat.  Vitals stable.  Does not appear to be systemically sick. Principal Problem: Psychosis ([place]) Diagnosis: Principal Problem:   Psychosis ([place]) Active Problems:   Polysubstance abuse ([place])   Depressed mood   Substance induced mood disorder (HCC)   Schizophrenia, paranoid ([place])  Total Time spent with patient: 30 minutes  Past Psychiatric History: Substance abuse problems going back to childhood more recently major mood and behavior problems with psychosis  Past Medical History:  Past Medical History:  Diagnosis Date  . Burning with urination 05/03/2015  . Contraceptive management 05/03/2015  . Depression   . Dysmenorrhea 12/30/2013  . Heroin addiction ([place])   . Menorrhagia 12/30/2013  . Menstrual extraction 12/30/2013  . Migraines   . Social anxiety disorder 09/25/2016  . Suicidal ideations   . Vaginal odor 05/03/2015    Past Surgical History:  Procedure Laterality Date  . NO PAST SURGERIES     Family History:  Family History  Problem Relation Age of Onset  . Depression Mother   . Hypertension Father   . Hyperlipidemia Father   . Cancer Paternal Grandmother        breast, uterine  . Cirrhosis Paternal Grandfather        due to alcohol   Family  Psychiatric  History: See previous.  Mother evidently killed her self and several members of that side of the family have had severe mental illness as well Social History:  Social History   Substance and Sexual Activity  Alcohol Use Yes   Comment: BAC was clear     Social History   Substance and Sexual Activity  Drug Use Yes  . Types: Marijuana, Oxycodone   Comment: reports oxycodone and marijuana    Social History   Socioeconomic History  . Marital status: Single    Spouse name: Not on file  . Number of children: Not on file  . Years of education: Not on file  . Highest education level: Not on file  Occupational History  . Occupation: Unemployed  Tobacco Use  . Smoking status: Current Every Day Smoker    Packs/day: 0.50    Types: Cigarettes  . Smokeless tobacco: Never Used  . Tobacco comment: Not ready to quit  Substance and Sexual Activity  . Alcohol use: Yes    Comment: BAC was clear  . Drug use: Yes    Types: Marijuana, Oxycodone    Comment: reports oxycodone and marijuana  . Sexual activity: Yes    Birth control/protection: Implant  Other Topics Concern  . Not on file  Social History Narrative   06/23/2019:  Pt stated that she is homeless, that she is a high school graduate, and that she is unemployed and not followed by any outpatient provider.  Lives with Dad. Mom has LGD, lives in [place]. 11th grader. Dog.   Social Determinants of Health   Financial Resource Strain:   . Difficulty of Paying Living Expenses: Not on file  Food Insecurity:   . Worried About Charity fundraiser in the Last Year: Not on file  . Ran Out of Food in the Last Year: Not on file  Transportation Needs:   . Lack of Transportation (Medical): Not on file  . Lack of Transportation (Non-Medical): Not on file  Physical Activity:   . Days of Exercise per Week: Not on file  . Minutes of Exercise per Session: Not on file  Stress:   . Feeling of Stress : Not on file  Social Connections:    . Frequency of Communication with Friends and Family: Not on file  . Frequency of Social Gatherings with Friends and Family: Not on file  . Attends Religious Services: Not on file  . Active Member of Clubs or Organizations: Not on file  . Attends Archivist Meetings: Not on file  . Marital Status: Not on file   Additional Social History:                         Sleep: Fair  Appetite:  Fair  Current Medications: Current Facility-Administered Medications  Medication Dose Route Frequency Provider Last Rate Last Admin  . benztropine (COGENTIN) tablet 1 mg  1 mg Oral BID Tranice Laduke, [name], MD   1 mg at 08/19/19 0808  . FLUoxetine (PROZAC) capsule 40 mg  40 mg Oral Daily [name], MD   40 mg at 08/19/19 [id number]  . gabapentin (NEURONTIN) capsule 300 mg  300 mg Oral TID [name], MD   300 mg at 08/19/19 1216  . haloperidol decanoate (HALDOL DECANOATE) 100 MG/ML injection 50 mg  50 mg Intramuscular Q30 days Iram Astorino T, MD   50 mg at 08/18/19 1411  . ibuprofen (ADVIL) tablet 600 mg  600 mg Oral Q6H PRN Jaziyah Gradel, [name], MD   600 mg at 08/19/19 1505  . menthol-cetylpyridinium (CEPACOL) lozenge 3 mg  1 lozenge Oral PRN Raechelle Sarti T, MD      . paliperidone (INVEGA SUSTENNA) injection 156 mg  156 mg Intramuscular Q30 days Duke Weisensel, [name], MD   156 mg at 08/18/19 1410  . paliperidone (INVEGA) 24 hr tablet 6 mg  6 mg Oral QHS Joe Gee T, MD   6 mg at 08/18/19 2128  . traZODone (DESYREL) tablet 50 mg  50 mg Oral QHS Karra Pink, [name], MD   50 mg at 08/18/19 2128    Lab Results: No results found for this or any previous visit (from the past 48 hour(s)).  Blood Alcohol level:  Lab Results  Component Value Date   ETH <10 07/09/2019   ETH <10 [id number]    Metabolic Disorder Labs: Lab Results  Component Value Date   HGBA1C 5.2 05/17/2019   MPG 102.54 05/17/2019   MPG 108.28 02/17/2019   Lab Results  Component Value Date   PROLACTIN 31.9 (H) 02/17/2019    PROLACTIN 29.6 (H) 09/25/2016   Lab Results  Component Value Date   CHOL 144 05/17/2019   TRIG 65 05/17/2019   HDL 35 (L) 05/17/2019   CHOLHDL 4.1 05/17/2019   VLDL 13 05/17/2019   LDLCALC 96 05/17/2019   LDLCALC 76 02/17/2019    Physical Findings: AIMS:  , ,  ,  ,  CIWA:    COWS:     Musculoskeletal: Strength & Muscle Tone: within normal limits Gait & Station: normal Patient leans: N/A  Psychiatric Specialty Exam: Physical Exam  Nursing note and vitals reviewed. Constitutional: She appears well-developed and well-nourished.  HENT:  Head: Normocephalic and atraumatic.  Eyes: Pupils are equal, round, and reactive to light. Conjunctivae are normal.  Cardiovascular: Regular rhythm and normal heart sounds.  Respiratory: Effort normal.  GI: Soft.  Musculoskeletal:        General: Normal range of motion.     Cervical back: Normal range of motion.  Neurological: She is alert.  Skin: Skin is warm and dry.  Psychiatric: Her affect is blunt. Her speech is delayed. She is slowed. Thought content is paranoid. Cognition and memory are impaired. She expresses impulsivity.    Review of Systems  Constitutional: Negative.   HENT: Negative.   Eyes: Negative.   Respiratory: Negative.   Cardiovascular: Negative.   Gastrointestinal: Negative.   Musculoskeletal: Negative.   Skin: Negative.   Neurological: Negative.   Psychiatric/Behavioral: Positive for confusion and hallucinations.    Blood pressure 94/65, pulse (!) 109, temperature 98.7 F (37.1 C), temperature source Oral, resp. rate 18, height 5\' 4"  (1.626 m), weight 67.2 kg, SpO2 99 %.Body mass index is 25.43 kg/m.  General Appearance: Casual  Eye Contact:  Fair  Speech:  Slow  Volume:  Decreased  Mood:  Dysphoric  Affect:  Congruent  Thought Process:  Disorganized  Orientation:  Full (Time, Place, and Person)  Thought Content:  Illogical and Rumination  Suicidal Thoughts:  No  Homicidal Thoughts:  No  Memory:   Immediate;   Fair Recent;   Fair Remote;   Fair  Judgement:  Poor  Insight:  Shallow  Psychomotor Activity:  Normal  Concentration:  Concentration: Fair  Recall:  Poor  Fund of Knowledge:  Poor  Language:  Fair  Akathisia:  No  Handed:  Right  AIMS (if indicated):     Assets:  Physical Health  ADL's:  Impaired  Cognition:  Impaired,  Mild  Sleep:  Number of Hours: 8.15     Treatment Plan Summary: Daily contact with patient to assess and evaluate symptoms and progress in treatment, Medication management and Plan No change to psychiatric medicines.  We just adjusted those yesterday.  We are back to trying to figure out some kind of plan for disposition.  Meanwhile I have ordered Cepacol lozenges for her throat for today  Alethia Berthold, MD 08/19/2019, 3:51 PM

## 2019-08-19 NOTE — Progress Notes (Signed)
Patient just came to this writer and stated that "the guy who I hear in my head just told me either I'm going to have you or you'll be dead". This Probation officer asked patient I she feels safe on the unit and she said yes. This Probation officer also asked patient is there a certain time of day that she hears voices and she said no. Patient also stated that she taps her thigh when she hears the voices.

## 2019-08-19 NOTE — Progress Notes (Signed)
Recreation Therapy Notes   Date: 08/19/2019  Time: 9:30 am  Location: Craft room  Behavioral response: Appropriate   Intervention Topic: Teamwork   Discussion/Intervention:  Group content on today was focused on teamwork. The group identified what teamwork is. Individuals described who is a part of their team. Patients expressed why they thought teamwork is important. The group stated reasons why they thought it was easier to work with a Dance movement psychotherapist team. Individuals discussed some positives and negatives of working with a team. Patients gave examples of past experiences they had while working with a team. The group participated in the intervention "Story in a bag", patients were in groups and were able to test their skill in a team setting.  Clinical Observations/Feedback:  Patient came to group and explained that the characterisitcs she looks for in team members is reliability. Individual was social with peers and staff while participating in the group intervention. Participant was able to focus during entire group and was on topic with no outburst.  Jonanthony Nahar LRT/CTRS         Lucrecia Mcphearson 08/19/2019 11:36 AM

## 2019-08-19 NOTE — Progress Notes (Signed)
CSW received a call from [facility] at [facility] who reported that pt was reviewed by the staff there and they decided that pt will not be added to the waitlist as it seems pt needs placement and not hospitalization.   Evalina Field, MSW, LCSW Clinical Social Work 08/19/2019 2:06 PM

## 2019-08-19 NOTE — [facility] Group Notes (Signed)
Balance In Life 08/19/2019 1PM  Type of Therapy/Topic:  Group Therapy:  Balance in Life  Participation Level:  Did Not Attend  Description of Group:   This group will address the concept of balance and how it feels and looks when one is unbalanced. Patients will be encouraged to process areas in their lives that are out of balance and identify reasons for remaining unbalanced. Facilitators will guide patients in utilizing problem-solving interventions to address and correct the stressor making their life unbalanced. Understanding and applying boundaries will be explored and addressed for obtaining and maintaining a balanced life. Patients will be encouraged to explore ways to assertively make their unbalanced needs known to significant others in their lives, using other group members and facilitator for support and feedback.  Therapeutic Goals: 1. Patient will identify two or more emotions or situations they have that consume much of in their lives. 2. Patient will identify signs/triggers that life has become out of balance:  3. Patient will identify two ways to set boundaries in order to achieve balance in their lives:  4. Patient will demonstrate ability to communicate their needs through discussion and/or role plays  Summary of Patient Progress:    Therapeutic Modalities:   Cognitive Behavioral Therapy Solution-Focused Therapy Assertiveness Training  Marchele Decock Lynelle Smoke, LCSW

## 2019-08-20 MED ORDER — ACETAMINOPHEN 325 MG PO TABS
650.0000 mg | ORAL_TABLET | Freq: Four times a day (QID) | ORAL | Status: DC | PRN
  Administered 2019-08-21 – 2019-08-25 (×7): 650 mg via ORAL
  Filled 2019-08-20 (×7): qty 2

## 2019-08-20 MED ORDER — MENTHOL 3 MG MT LOZG
1.0000 | LOZENGE | OROMUCOSAL | Status: DC | PRN
  Administered 2019-08-21 – 2019-08-23 (×6): 3 mg via ORAL
  Filled 2019-08-20: qty 9

## 2019-08-20 NOTE — Progress Notes (Signed)
Patient remains with flat sad affect.. Visible in milieu. Isolative to self. Denies SI, HI AVH. Medication compliant. Eating meals well. In no distress, no complaints. Encouragement and support provided, medication given as prescribed. Pt receptive and remains safe on unit with q 15 min checks.

## 2019-08-20 NOTE — B[facility] Group Notes (Signed)
LCSW Group Therapy Note  08/20/2019 2:10 PM  Type of Therapy/Topic:  Group Therapy:  Emotion Regulation  Participation Level:  Did Not Attend   Description of Group:   The purpose of this group is to assist patients in learning to regulate negative emotions and experience positive emotions. Patients will be guided to discuss ways in which they have been vulnerable to their negative emotions. These vulnerabilities will be juxtaposed with experiences of positive emotions or situations, and patients will be challenged to use positive emotions to combat negative ones. Special emphasis will be placed on coping with negative emotions in conflict situations, and patients will process healthy conflict resolution skills.  Therapeutic Goals: 1. Patient will identify two positive emotions or experiences to reflect on in order to balance out negative emotions 2. Patient will label two or more emotions that they find the most difficult to experience 3. Patient will demonstrate positive conflict resolution skills through discussion and/or role plays  Summary of Patient Progress: x   Therapeutic Modalities:   Cognitive Behavioral Therapy Feelings Identification Dialectical Behavioral Therapy   Evalina Field, MSW, LCSW Clinical Social Work 08/20/2019 2:10 PM

## 2019-08-20 NOTE — Plan of Care (Signed)

## 2019-08-20 NOTE — Progress Notes (Signed)
Patient alert and oriented x 4, affect is flat and sad, she was noted restless no bizarre bizarre on unit, she denies SI/HI/AVH. She was noted interacting appropriately with peers and staff, compliant with medication regimen, emotional support and encouragement offered she was receptive, 15 minutes safety checks maintained will continue to monitor.

## 2019-08-20 NOTE — Progress Notes (Signed)
Supervisor confirmed with [facility] Director that Bath can be prepared and sent to group home today.  Otila Kluver Stutts attempted to both fax and then email letter to Spero Geralds of [facility] group home and eventually Ms. Torain confirmed she had received it.   Supervisor spoke with Ms. Torain several times regarding the terms of the LOG and Ms. Torain is in agreement to move forward and confirmed that plan remains for pt to discharge to her program on Monday, 08/23/19. [name], MSW, LCSW Advanced Care Supervisor 08/20/2019 2:37 PM

## 2019-08-20 NOTE — Progress Notes (Signed)
Patient was heavily sedated she wouldn't turn around to take her medication, no distress noted, respiration even non labored, will continue to monitor.

## 2019-08-20 NOTE — Progress Notes (Signed)
[facility] MD Progress Note  08/20/2019 4:36 PM Julia [name]  MRN:  [id number] Subjective: Patient seen chart reviewed.  19 year old with schizophrenia.  Patient's only complaint today is that she feels like she is getting a cold.  She has a sore throat and was feeling chilled earlier today.  She is not coughing and does not appear septic.  Patient psychiatrically has been behaving well no new outbursts today. Principal Problem: Psychosis ([place]) Diagnosis: Principal Problem:   Psychosis ([place]) Active Problems:   Polysubstance abuse ([place])   Depressed mood   Substance induced mood disorder ([facility])   Schizophrenia, paranoid ([place])  Total Time spent with patient: 30 minutes  Past Psychiatric History: See previous notes.  Multiple hospitalizations with worsening psychosis  Past Medical History:  Past Medical History:  Diagnosis Date  . Burning with urination 05/03/2015  . Contraceptive management 05/03/2015  . Depression   . Dysmenorrhea 12/30/2013  . Heroin addiction ([place])   . Menorrhagia 12/30/2013  . Menstrual extraction 12/30/2013  . Migraines   . Social anxiety disorder 09/25/2016  . Suicidal ideations   . Vaginal odor 05/03/2015    Past Surgical History:  Procedure Laterality Date  . NO PAST SURGERIES     Family History:  Family History  Problem Relation Age of Onset  . Depression Mother   . Hypertension Father   . Hyperlipidemia Father   . Cancer Paternal Grandmother        breast, uterine  . Cirrhosis Paternal Grandfather        due to alcohol   Family Psychiatric  History: See previous Social History:  Social History   Substance and Sexual Activity  Alcohol Use Yes   Comment: BAC was clear     Social History   Substance and Sexual Activity  Drug Use Yes  . Types: Marijuana, Oxycodone   Comment: reports oxycodone and marijuana    Social History   Socioeconomic History  . Marital status: Single    Spouse name: Not on file  . Number of children: Not on file  .  Years of education: Not on file  . Highest education level: Not on file  Occupational History  . Occupation: Unemployed  Tobacco Use  . Smoking status: Current Every Day Smoker    Packs/day: 0.50    Types: Cigarettes  . Smokeless tobacco: Never Used  . Tobacco comment: Not ready to quit  Substance and Sexual Activity  . Alcohol use: Yes    Comment: BAC was clear  . Drug use: Yes    Types: Marijuana, Oxycodone    Comment: reports oxycodone and marijuana  . Sexual activity: Yes    Birth control/protection: Implant  Other Topics Concern  . Not on file  Social History Narrative   06/23/2019:  Pt stated that she is homeless, that she is a high school graduate, and that she is unemployed and not followed by any outpatient provider.      Lives with Dad. Mom has LGD, lives in [place]. 11th grader. Dog.   Social Determinants of Health   Financial Resource Strain:   . Difficulty of Paying Living Expenses: Not on file  Food Insecurity:   . Worried About Charity fundraiser in the Last Year: Not on file  . Ran Out of Food in the Last Year: Not on file  Transportation Needs:   . Lack of Transportation (Medical): Not on file  . Lack of Transportation (Non-Medical): Not on file  Physical Activity:   .  Days of Exercise per Week: Not on file  . Minutes of Exercise per Session: Not on file  Stress:   . Feeling of Stress : Not on file  Social Connections:   . Frequency of Communication with Friends and Family: Not on file  . Frequency of Social Gatherings with Friends and Family: Not on file  . Attends Religious Services: Not on file  . Active Member of Clubs or Organizations: Not on file  . Attends Archivist Meetings: Not on file  . Marital Status: Not on file   Additional Social History:                         Sleep: Fair  Appetite:  Fair  Current Medications: Current Facility-Administered Medications  Medication Dose Route Frequency Provider Last Rate Last  Admin  . acetaminophen (TYLENOL) tablet 650 mg  650 mg Oral Q6H PRN Macyn Remmert T, MD      . benztropine (COGENTIN) tablet 1 mg  1 mg Oral BID Briahnna Harries, [name], MD   1 mg at 08/20/19 0745  . FLUoxetine (PROZAC) capsule 40 mg  40 mg Oral Daily [name], MD   40 mg at 08/20/19 0745  . gabapentin (NEURONTIN) capsule 300 mg  300 mg Oral TID [name], MD   300 mg at 08/20/19 1134  . haloperidol decanoate (HALDOL DECANOATE) 100 MG/ML injection 50 mg  50 mg Intramuscular Q30 days Rin Gorton T, MD   50 mg at 08/18/19 1411  . ibuprofen (ADVIL) tablet 600 mg  600 mg Oral Q6H PRN Hawley Michel, [name], MD   600 mg at 08/20/19 [id number]  . menthol-cetylpyridinium (CEPACOL) lozenge 3 mg  1 lozenge Oral PRN Sabriel Borromeo T, MD      . paliperidone (INVEGA SUSTENNA) injection 156 mg  156 mg Intramuscular Q30 days Jerry Clyne, [name], MD   156 mg at 08/18/19 1410  . paliperidone (INVEGA) 24 hr tablet 6 mg  6 mg Oral QHS Idamay Hosein T, MD   6 mg at 08/18/19 2128  . traZODone (DESYREL) tablet 50 mg  50 mg Oral QHS Jullien Granquist, [name], MD   50 mg at 08/18/19 2128    Lab Results: No results found for this or any previous visit (from the past 48 hour(s)).  Blood Alcohol level:  Lab Results  Component Value Date   ETH <10 07/09/2019   ETH <10 [id number]    Metabolic Disorder Labs: Lab Results  Component Value Date   HGBA1C 5.2 05/17/2019   MPG 102.54 05/17/2019   MPG 108.28 02/17/2019   Lab Results  Component Value Date   PROLACTIN 31.9 (H) 02/17/2019   PROLACTIN 29.6 (H) 09/25/2016   Lab Results  Component Value Date   CHOL 144 05/17/2019   TRIG 65 05/17/2019   HDL 35 (L) 05/17/2019   CHOLHDL 4.1 05/17/2019   VLDL 13 05/17/2019   LDLCALC 96 05/17/2019   LDLCALC 76 02/17/2019    Physical Findings: AIMS:  , ,  ,  ,    CIWA:    COWS:     Musculoskeletal: Strength & Muscle Tone: within normal limits Gait & Station: normal Patient leans: N/A  Psychiatric Specialty Exam: Physical Exam   Nursing note and vitals reviewed. Constitutional: She appears well-developed and well-nourished.  HENT:  Head: Normocephalic and atraumatic.  Eyes: Pupils are equal, round, and reactive to light. Conjunctivae are normal.  Cardiovascular: Regular rhythm and normal heart sounds.  Respiratory: Effort normal.  GI: Soft.  Musculoskeletal:        General: Normal range of motion.     Cervical back: Normal range of motion.  Neurological: She is alert.  Skin: Skin is warm and dry.  Psychiatric: She has a normal mood and affect. Her behavior is normal. Judgment and thought content normal.    Review of Systems  Constitutional: Negative.   HENT: Negative.   Eyes: Negative.   Respiratory: Negative.   Cardiovascular: Negative.   Gastrointestinal: Negative.   Musculoskeletal: Negative.   Skin: Negative.   Neurological: Negative.   Psychiatric/Behavioral: Negative.     Blood pressure 100/71, pulse (!) 124, temperature 98.8 F (37.1 C), temperature source Oral, resp. rate 17, height 5\' 4"  (1.626 m), weight 67.2 kg, SpO2 97 %.Body mass index is 25.43 kg/m.  General Appearance: Casual  Eye Contact:  Good  Speech:  Clear and Coherent  Volume:  Normal  Mood:  Euthymic  Affect:  Congruent  Thought Process:  Goal Directed  Orientation:  Full (Time, Place, and Person)  Thought Content:  Logical  Suicidal Thoughts:  No  Homicidal Thoughts:  No  Memory:  Immediate;   Fair Recent;   Fair Remote;   Fair  Judgement:  Fair  Insight:  Fair  Psychomotor Activity:  Normal  Concentration:  Concentration: Fair  Recall:  [employer] of Knowledge:  Fair  Language:  Fair  Akathisia:  No  Handed:  Right  AIMS (if indicated):     Assets:  Desire for Improvement  ADL's:  Intact  Cognition:  WNL  Sleep:  Number of Hours: 8.25     Treatment Plan Summary: Daily contact with patient to assess and evaluate symptoms and progress in treatment, Medication management and Plan Patient will be put back on  as needed Tylenol and throat lozenges no change to psychiatric medicine.  We are hoping for discharge Monday  Alethia Berthold, MD 08/20/2019, 4:36 PM

## 2019-08-20 NOTE — Progress Notes (Signed)
Recreation Therapy Notes  Date: 08/20/2019  Time: 9:30 am   Location: Craft room   Behavioral response: N/A   Intervention Topic: Leisure  Discussion/Intervention: Patient did not attend group.   Clinical Observations/Feedback:  Patient did not attend group.   Auguste Tebbetts LRT/CTRS         Cypress Fanfan 08/20/2019 12:00 PM

## 2019-08-21 DIAGNOSIS — F200 Paranoid schizophrenia: Secondary | ICD-10-CM

## 2019-08-21 MED ORDER — ACETAMINOPHEN 325 MG PO TABS: 650.0000 mg | ORAL_TABLET | Freq: Four times a day (QID) | ORAL | Status: DC | PRN

## 2019-08-21 NOTE — Tx Team (Signed)
Interdisciplinary Treatment and Diagnostic Plan Update  08/21/2019 Time of Session: 8:30am Julia [name] MRN: [id number]  Principal Diagnosis: Psychosis [facility])  Secondary Diagnoses: Principal Problem:   Psychosis ([place]) Active Problems:   Polysubstance abuse ([place])   Depressed mood   Substance induced mood disorder ([place])   Schizophrenia, paranoid ([place])   Current Medications:  Current Facility-Administered Medications  Medication Dose Route Frequency Provider Last Rate Last Admin  . acetaminophen (TYLENOL) tablet 650 mg  650 mg Oral Q6H PRN [name], [name], MD   650 mg at 08/21/19 0803  . benztropine (COGENTIN) tablet 1 mg  1 mg Oral BID [name], [name], MD   1 mg at 08/21/19 0802  . FLUoxetine (PROZAC) capsule 40 mg  40 mg Oral Daily [name], MD   40 mg at 08/21/19 0802  . gabapentin (NEURONTIN) capsule 300 mg  300 mg Oral TID [name], MD   300 mg at 08/21/19 0801  . haloperidol decanoate (HALDOL DECANOATE) 100 MG/ML injection 50 mg  50 mg Intramuscular Q30 days [name], John T, MD   50 mg at 08/18/19 1411  . ibuprofen (ADVIL) tablet 600 mg  600 mg Oral Q6H PRN [name], [name], MD   600 mg at 08/20/19 [id number]  . menthol-cetylpyridinium (CEPACOL) lozenge 3 mg  1 lozenge Oral PRN [name], John T, MD      . paliperidone (INVEGA SUSTENNA) injection 156 mg  156 mg Intramuscular Q30 days [name], [name], MD   156 mg at 08/18/19 1410  . traZODone (DESYREL) tablet 50 mg  50 mg Oral QHS [name], John T, MD   50 mg at 08/18/19 2128   PTA Medications: Medications Prior to Admission  Medication Sig Dispense Refill Last Dose  . etonogestrel (NEXPLANON) 68 MG IMPL implant 1 each (68 mg total) by Subdermal route once for 1 dose. (Birth control method) 1 each 0   . FLUoxetine (PROZAC) 10 MG capsule Take 3 capsules (30 mg total) by mouth daily. 90 capsule 1   . hydrOXYzine (ATARAX/VISTARIL) 25 MG tablet Take 1 tablet (25 mg total) by mouth every 6 (six) hours as needed for anxiety. 60 tablet  1   . QUEtiapine (SEROQUEL) 50 MG tablet Take 1 tablet (50 mg total) by mouth daily. 30 tablet 1   . traZODone (DESYREL) 50 MG tablet Take 50 mg by mouth at bedtime as needed. for sleep       Patient Stressors: Financial difficulties Loss of Mother Marital or family conflict Substance abuse  Patient Strengths: Ability for insight Communication skills  Treatment Modalities: Medication Management, Group therapy, Case management,  1 to 1 session with clinician, Psychoeducation, Recreational therapy.   Physician Treatment Plan for Primary Diagnosis: Psychosis ([place]) Long Term Goal(s): Improvement in symptoms so as ready for discharge Improvement in symptoms so as ready for discharge   Short Term Goals: Ability to verbalize feelings will improve Ability to disclose and discuss suicidal ideas Ability to demonstrate self-control will improve Ability to identify and develop effective coping behaviors will improve Compliance with prescribed medications will improve  Medication Management: Evaluate patient's response, side effects, and tolerance of medication regimen.  Therapeutic Interventions: 1 to 1 sessions, Unit Group sessions and Medication administration.  Evaluation of Outcomes: Progressing  Physician Treatment Plan for Secondary Diagnosis: Principal Problem:   Psychosis ([place]) Active Problems:   Polysubstance abuse ([place])   Depressed mood   Substance induced mood disorder ([facility])   Schizophrenia, paranoid ([place])  Long Term Goal(s): Improvement in  symptoms so as ready for discharge Improvement in symptoms so as ready for discharge   Short Term Goals: Ability to verbalize feelings will improve Ability to disclose and discuss suicidal ideas Ability to demonstrate self-control will improve Ability to identify and develop effective coping behaviors will improve Compliance with prescribed medications will improve     Medication Management: Evaluate patient's response, side  effects, and tolerance of medication regimen.  Therapeutic Interventions: 1 to 1 sessions, Unit Group sessions and Medication administration.  Evaluation of Outcomes: Progressing   RN Treatment Plan for Primary Diagnosis: Psychosis ([place]) Long Term Goal(s): Knowledge of disease and therapeutic regimen to maintain health will improve  Short Term Goals: Ability to participate in decision making will improve, Ability to verbalize feelings will improve, Ability to disclose and discuss suicidal ideas, Ability to identify and develop effective coping behaviors will improve and Compliance with prescribed medications will improve  Medication Management: RN will administer medications as ordered by provider, will assess and evaluate patient's response and provide education to patient for prescribed medication. RN will report any adverse and/or side effects to prescribing provider.  Therapeutic Interventions: 1 on 1 counseling sessions, Psychoeducation, Medication administration, Evaluate responses to treatment, Monitor vital signs and CBGs as ordered, Perform/monitor CIWA, COWS, AIMS and Fall Risk screenings as ordered, Perform wound care treatments as ordered.  Evaluation of Outcomes: Progressing   LCSW Treatment Plan for Primary Diagnosis: Psychosis ([place]) Long Term Goal(s): Safe transition to appropriate next level of care at discharge, Engage patient in therapeutic group addressing interpersonal concerns.  Short Term Goals: Engage patient in aftercare planning with referrals and resources and Increase skills for wellness and recovery  Therapeutic Interventions: Assess for all discharge needs, 1 to 1 time with Social worker, Explore available resources and support systems, Assess for adequacy in community support network, Educate family and significant other(s) on suicide prevention, Complete Psychosocial Assessment, Interpersonal group therapy.  Evaluation of Outcomes: Progressing   Progress in  Treatment: Attending groups: Yes. Participating in groups: No. Taking medication as prescribed: Yes. Toleration medication: Yes. Family/Significant other contact made: No, will contact:  pt declined spe; with pt Patient understands diagnosis: Yes. Discussing patient identified problems/goals with staff: Yes. Medical problems stabilized or resolved: Yes. Denies suicidal/homicidal ideation: Yes. Issues/concerns per patient self-inventory: No. Other:   New problem(s) identified: No, Describe:  None  New Short Term/Long Term Goal(s): Medication stabilization, elimination of SI thoughts, and development of a comprehensive mental wellness plan.   Patient Goals:    "to have a plan"  Update 07/27/2019: No changes at this time. Update 07/31/2019: No changes at this time Update 08/12/19: No change at this time.  Discharge Plan or Barriers: Patient is currently homeless.  Patient aftercare depends on where patient will be staying. CSW is attempting to identify an available bed at local homeless shelters.  Patient reports that she can not return to [facility]. Update 07/22/19-Pt displaying psychotic behavior. No other social supports identified. CSW's attempted to contact pt boyfriend and father. Awaiting response from boyfriend and pt father lives in a nursing home.  Discharge plan TBD Update 07/27/2019:  Patient continues to adjust to her medications. She continues to have some delusions in regards of being a part of witness protection and having the police locate housing for her. Patient is homeless with no supports from home to assist with housing.  Application for [employer] in [place] has been sent.  No word yet on possible housing.  Patient can not stay with  father, return to [facility] and calls to her boyfriend have not been returned. Update 07/31/2019: CSW messaged Ana Leamon at [facility] about obtaining medicaid for the patient and helping her through the process of receiving medicaid  so that patient can have more opportunities for aftercare and services. Update 08/12/19:Pt is homeless. Pt has been referred to a group home and is agreeable with this decision, awaiting decision. APS report made while pt hospitalized, DSS referred pt for guardianship, awaiting decision. Medicaid application started while pt hospitalized, awaiting decision. Update 08/17/19: Pt has been accepted to a group home, but due to lack of medicaid it is on hold because pt cannot attend the day program without medicaid in place. Medicaid decision should be made by 09/18/19. CSW will reach out to other group homes in the area to see if they will accept pt with a LOG.  Reason for Continuation of Hospitalization: Delusions  Medication stabilization  Estimated Length of Stay: TBD   Attendees: Patient:  08/07/2019  Physician: Dr. Jake Samples 08/07/2019   Nursing: Polly Cobia 08/07/2019   RN Care Manager: 08/07/2019   Social Worker: Juanetta Beets, [place], [place] 08/07/2019   Recreational Therapist:  08/07/2019   Other:  08/07/2019   Other:  08/07/2019   Other: 08/07/2019       Scribe for Treatment Team: Charlott Rakes, [place] 08/21/2019 10:19 AM

## 2019-08-21 NOTE — [facility] Group Notes (Signed)
[place] LCSW Group Therapy Note  Date/Time:  08/21/2019  1:00PM  Type of Therapy and Topic:  Group Therapy:  Healthy and Unhealthy Supports  Participation Level:  Did Not Attend   Description of Group:  Patients in this group were introduced to the idea of adding a variety of healthy supports to address the various needs in their lives.Patients discussed what additional healthy supports could be helpful in their recovery and wellness after discharge in order to prevent future hospitalizations.   An emphasis was placed on using counselor, doctor, therapy groups, 12-step groups, and problem-specific support groups to expand supports.  They also worked as a group on developing a specific plan for several patients to deal with unhealthy supports through [place], psychoeducation with loved ones, and even termination of relationships.   Therapeutic Goals:   1)  discuss importance of adding supports to stay well once out of the hospital  2)  compare healthy versus unhealthy supports and identify some examples of each  3)  generate ideas and descriptions of healthy supports that can be added  4)  offer mutual support about how to address unhealthy supports  5)  encourage active participation in and adherence to discharge plan    Summary of Patient Progress:    Patient did not attend group.   Therapeutic Modalities:   Motivational Interviewing Brief Solution-Focused Therapy    Netta Neat, MSW, LCSW Clinical Social Work

## 2019-08-21 NOTE — Progress Notes (Signed)
Patient was in her room upon arrival to the unit.Patient denies SI/HI/AVH, anxiety and pain.Patient compliant with some of her medications, but did refuse her sleeping medication (see MAR). Patient given education. Patient given support and encouragement to be active in her treatment plan.Patient being monitored Q 15 minutes for safety per unit protocol. Patient remains safe on the unit.Patient observed interacting appropriately with staff and peers on the unit.

## 2019-08-21 NOTE — Plan of Care (Signed)
Patient isolative and minimal with staff. Patient continues to sleep through this writers entire shift.   Problem: Activity: Goal: Imbalance in normal sleep/wake cycle will improve Outcome: Not Progressing

## 2019-08-21 NOTE — Progress Notes (Addendum)
[facility] MD Progress Note  08/21/2019 8:36 AM Julia [name]  MRN:  [id number] Subjective:    Patient is in bed she is alert oriented and cooperative she reports no current auditory or visual hallucinations and does not make any disjointed or unusual or delusional statements so indeed she is improving  Her complaint that was "I have a fever, I think I have a little sore throat"  She has had 4 - COVID test with her encounters in her healthcare system the last 1 being on 1/28.    Principal Problem: Psychosis ([place]) Diagnosis: Principal Problem:   Psychosis ([place]) Active Problems:   Polysubstance abuse ([place])   Depressed mood   Substance induced mood disorder (HCC)   Schizophrenia, paranoid ([place])  Total Time spent with patient: 20 minutes  Past Psychiatric History: Treatment resistant schizophrenia  Past Medical History:  Past Medical History:  Diagnosis Date  . Burning with urination 05/03/2015  . Contraceptive management 05/03/2015  . Depression   . Dysmenorrhea 12/30/2013  . Heroin addiction ([place])   . Menorrhagia 12/30/2013  . Menstrual extraction 12/30/2013  . Migraines   . Social anxiety disorder 09/25/2016  . Suicidal ideations   . Vaginal odor 05/03/2015    Past Surgical History:  Procedure Laterality Date  . NO PAST SURGERIES     Family History:  Family History  Problem Relation Age of Onset  . Depression Mother   . Hypertension Father   . Hyperlipidemia Father   . Cancer Paternal Grandmother        breast, uterine  . Cirrhosis Paternal Grandfather        due to alcohol   Family Psychiatric  History: See eval Social History:  Social History   Substance and Sexual Activity  Alcohol Use Yes   Comment: BAC was clear     Social History   Substance and Sexual Activity  Drug Use Yes  . Types: Marijuana, Oxycodone   Comment: reports oxycodone and marijuana    Social History   Socioeconomic History  . Marital status: Single    Spouse name: Not on file  . Number  of children: Not on file  . Years of education: Not on file  . Highest education level: Not on file  Occupational History  . Occupation: Unemployed  Tobacco Use  . Smoking status: Current Every Day Smoker    Packs/day: 0.50    Types: Cigarettes  . Smokeless tobacco: Never Used  . Tobacco comment: Not ready to quit  Substance and Sexual Activity  . Alcohol use: Yes    Comment: BAC was clear  . Drug use: Yes    Types: Marijuana, Oxycodone    Comment: reports oxycodone and marijuana  . Sexual activity: Yes    Birth control/protection: Implant  Other Topics Concern  . Not on file  Social History Narrative   06/23/2019:  Pt stated that she is homeless, that she is a high school graduate, and that she is unemployed and not followed by any outpatient provider.      Lives with Dad. Mom has LGD, lives in [place]. 11th grader. Dog.   Social Determinants of Health   Financial Resource Strain:   . Difficulty of Paying Living Expenses: Not on file  Food Insecurity:   . Worried About Charity fundraiser in the Last Year: Not on file  . Ran Out of Food in the Last Year: Not on file  Transportation Needs:   . Lack of Transportation (Medical): Not on  file  . Lack of Transportation (Non-Medical): Not on file  Physical Activity:   . Days of Exercise per Week: Not on file  . Minutes of Exercise per Session: Not on file  Stress:   . Feeling of Stress : Not on file  Social Connections:   . Frequency of Communication with Friends and Family: Not on file  . Frequency of Social Gatherings with Friends and Family: Not on file  . Attends Religious Services: Not on file  . Active Member of Clubs or Organizations: Not on file  . Attends Archivist Meetings: Not on file  . Marital Status: Not on file   Additional Social History:                         Sleep: Fair  Appetite:  Fair  Current Medications: Current Facility-Administered Medications  Medication Dose Route  Frequency Provider Last Rate Last Admin  . acetaminophen (TYLENOL) tablet 650 mg  650 mg Oral Q6H PRN [name], [name], MD   650 mg at 08/21/19 0803  . benztropine (COGENTIN) tablet 1 mg  1 mg Oral BID [name], [name], MD   1 mg at 08/21/19 0802  . FLUoxetine (PROZAC) capsule 40 mg  40 mg Oral Daily [name], MD   40 mg at 08/21/19 0802  . gabapentin (NEURONTIN) capsule 300 mg  300 mg Oral TID [name], MD   300 mg at 08/21/19 0801  . haloperidol decanoate (HALDOL DECANOATE) 100 MG/ML injection 50 mg  50 mg Intramuscular Q30 days [name], John T, MD   50 mg at 08/18/19 1411  . ibuprofen (ADVIL) tablet 600 mg  600 mg Oral Q6H PRN [name], [name], MD   600 mg at 08/20/19 [id number]  . menthol-cetylpyridinium (CEPACOL) lozenge 3 mg  1 lozenge Oral PRN [name], John T, MD      . paliperidone (INVEGA SUSTENNA) injection 156 mg  156 mg Intramuscular Q30 days [name], [name], MD   156 mg at 08/18/19 1410  . paliperidone (INVEGA) 24 hr tablet 6 mg  6 mg Oral QHS [name], John T, MD   6 mg at 08/20/19 2200  . traZODone (DESYREL) tablet 50 mg  50 mg Oral QHS [name], [name], MD   50 mg at 08/18/19 2128    Lab Results: No results found for this or any previous visit (from the past 48 hour(s)).  Blood Alcohol level:  Lab Results  Component Value Date   ETH <10 07/09/2019   ETH <10 [id number]    Metabolic Disorder Labs: Lab Results  Component Value Date   HGBA1C 5.2 05/17/2019   MPG 102.54 05/17/2019   MPG 108.28 02/17/2019   Lab Results  Component Value Date   PROLACTIN 31.9 (H) 02/17/2019   PROLACTIN 29.6 (H) 09/25/2016   Lab Results  Component Value Date   CHOL 144 05/17/2019   TRIG 65 05/17/2019   HDL 35 (L) 05/17/2019   CHOLHDL 4.1 05/17/2019   VLDL 13 05/17/2019   LDLCALC 96 05/17/2019   LDLCALC 76 02/17/2019     Musculoskeletal: Strength & Muscle Tone: within normal limits Gait & Station: normal Patient leans: N/A  Psychiatric Specialty Exam: Physical Exam  Review  of Systems  Blood pressure 106/74, pulse (!) 112, temperature 100.1 F (37.8 C), temperature source Oral, resp. rate 17, height 5\' 4"  (1.626 m), weight 67.2 kg, SpO2 96 %.Body mass index is 25.43 kg/m.  General Appearance: Casual  Eye Contact:  Good  Speech:  Clear and Coherent  Volume:  Normal  Mood:  Anxious  Affect:  Appropriate  Thought Process:  Coherent, Goal Directed and Descriptions of Associations: Circumstantial  Orientation:  Full (Time, Place, and Person)  Thought Content:  Illogical and Denies auditory or visual hallucinations  Suicidal Thoughts:  No  Homicidal Thoughts:  No  Memory:  Recent;   Fair Remote;   Fair  Judgement:  Fair  Insight:  Fair  Psychomotor Activity:  Normal  Concentration:  Concentration: Fair and Attention Span: Fair  Recall:  [employer] of Knowledge:  Fair  Language:  Fair  Akathisia:  Negative  Handed:  Right  AIMS (if indicated):     Assets:  Communication Skills Desire for Improvement  ADL's:  Intact  Cognition:  WNL  Sleep:  Number of Hours: 7.5    Treatment Plan Summary: Daily contact with patient to assess and evaluate symptoms and progress in treatment and Medication management Patient schizophrenia is improved with combination antipsychotic therapy, Showing some improvement in reality based therapy Due to low-grade temperature we will discontinue the oral antipsychotic as her low-grade fever may be secondary to antipsychotic therapy.  Treat symptomatically for URI- Thus far there is no evidence of neuroleptic malignant syndrome but will make nurses aware to check for stiffness, confusion, worsening fever so forth we will add Tylenol as well   [name], MD 08/21/2019, 8:36 AM

## 2019-08-21 NOTE — Plan of Care (Signed)
Aware of  medication received. Patient aware of  education , able to verbalize understanding  with redirection from staff. Emotional and mental  health status  improving . Able to maintain frustration  no anger management  concerns. V oice of no safety concerns . Working on Scientific laboratory technician.    Problem: Education: Goal: Utilization of techniques to improve thought processes will improve Outcome: Progressing Goal: Knowledge of the prescribed therapeutic regimen will improve Outcome: Progressing   Problem: Activity: Goal: Interest or engagement in leisure activities will improve Outcome: Progressing Goal: Imbalance in normal sleep/wake cycle will improve Outcome: Progressing   Problem: Coping: Goal: Coping ability will improve Outcome: Progressing Goal: Will verbalize feelings Outcome: Progressing   Problem: Health Behavior/Discharge Planning: Goal: Ability to make decisions will improve Outcome: Progressing Goal: Compliance with therapeutic regimen will improve Outcome: Progressing   Problem: Education: Goal: Knowledge of the prescribed therapeutic regimen will improve Outcome: Progressing   Problem: Education: Goal: Emotional status will improve Outcome: Progressing

## 2019-08-21 NOTE — Progress Notes (Signed)
D:Patient continue to voice of having a sore throat .  Noted to try to follow  Staff and continue voice  Of  Sore throat  Medication given  with  Patient stated later in am she felt better.  Patient remain to isolate her self to her room. Coming out  For meals . No Interaction with her peers .   Patient came out of room noted to make eye  Contact with Probation officer .  Writer observed patient taping her legs . Patient  Later went to dayroom  Yelling at her peers  Hysterical. Patient escorted to room .  Patient was insistent on wanting medication  For the voices or people coming to get her . Informed  Patient there was no medication  And to use her coping skills .  Writer remained outside of patients door . Patient continued to cry stating no one was there to help her .  Writer again  Encourage patient to  Work on her coping skills , Patient stopped crying  Stated she was all right now .Denies suicidal  homicidal ideations  .  No auditory hallucinations  No pain concerns . Appropriate ADL'S. Interacting with peers and staff.  A: Encourage patient participation with unit programming . Instruction  Given on  Medication , verbalize understanding. R: Voice no other concerns. Staff continue to monitor

## 2019-08-22 DIAGNOSIS — F200 Paranoid schizophrenia: Secondary | ICD-10-CM

## 2019-08-22 MED ORDER — PALIPERIDONE ER 3 MG PO TB24
3.0000 mg | ORAL_TABLET | Freq: Every day | ORAL | Status: DC
  Administered 2019-08-22 – 2019-08-25 (×4): 3 mg via ORAL
  Filled 2019-08-22 (×4): qty 1

## 2019-08-22 MED ORDER — FLUOXETINE HCL 20 MG PO CAPS
20.0000 mg | ORAL_CAPSULE | Freq: Every day | ORAL | Status: DC
  Administered 2019-08-23 – 2019-08-25 (×3): 20 mg via ORAL
  Filled 2019-08-22 (×3): qty 1

## 2019-08-22 MED ORDER — CLONAZEPAM 1 MG PO TABS
2.0000 mg | ORAL_TABLET | Freq: Once | ORAL | Status: AC
  Administered 2019-08-22: 2 mg via ORAL
  Filled 2019-08-22: qty 2

## 2019-08-22 NOTE — [facility] Group Notes (Signed)
LCSW Group Therapy Notes   Date and Time: 08/22/2019 1:00pm  Type of Therapy and Topic: Group Therapy: Worry and Anxiety  Participation Level: [facility] PARTICIPATION LEVEL: Did Not Attend  Description of Group: In this group, patients will be encouraged to explore their worry around what could happen vs what will happen. Each patient will be challenged to think of personal worries and how they will work their way through that worry around what will happen and what could happen. This group will be process-oriented, with patients participating in exploration of their own experiences as well as giving and receiving support and challenge from other group members.  Therapeutic Goals: 1. Patient will identify personal worries that cause anxiety. 2. Patient will identify clues to identify their worry. 3. Patient will identify ways to handle their worry. 4. Patient will discuss ways that their worry has deceased or why it has not decreased.   Summary of Patient Progress:  X  Therapeutic Modalities:  Cognitive Behavioral Therapy Solution Focused Therapy Motivational Interviewing    Juanetta Beets, MSW, [place] Social Worker

## 2019-08-22 NOTE — Progress Notes (Signed)
[facility] MD Progress Note  08/22/2019 8:53 AM Julia [name]  MRN:  [id number] Subjective:    Overa is a 19 year old patient with treatment resistant schizophrenia, history of substance abuse, who is on a combination of long-acting injectable paliperidone and haloperidol.  She had a low-grade fever over the weekend it resolved over the past 24 hours -we had discontinued her oral paliperidone fearing that it was the antipsychotics possibly causing temperature alterations which is certainly a known risk factor- However this morning she is acutely paranoid screaming "I almost punched somebody" and screaming "I am being tortured" and nursing staff had to calm her, but again she is intensely paranoid today Prior to this she had seen [name] in the hallway insisting she had "evidence" she was being tortured Principal Problem: Psychosis ([place]) Diagnosis: Principal Problem:   Psychosis ([place]) Active Problems:   Polysubstance abuse ([place])   Depressed mood   Substance induced mood disorder ([place])   Schizophrenia, paranoid ([place])  Total Time spent with patient: 20 minutes  Past Psychiatric History: Extensive  Past Medical History:  Past Medical History:  Diagnosis Date  . Burning with urination 05/03/2015  . Contraceptive management 05/03/2015  . Depression   . Dysmenorrhea 12/30/2013  . Heroin addiction ([place])   . Menorrhagia 12/30/2013  . Menstrual extraction 12/30/2013  . Migraines   . Social anxiety disorder 09/25/2016  . Suicidal ideations   . Vaginal odor 05/03/2015    Past Surgical History:  Procedure Laterality Date  . NO PAST SURGERIES     Family History:  Family History  Problem Relation Age of Onset  . Depression Mother   . Hypertension Father   . Hyperlipidemia Father   . Cancer Paternal Grandmother        breast, uterine  . Cirrhosis Paternal Grandfather        due to alcohol   Family Psychiatric  History: See eval Social History:  Social History   Substance and Sexual Activity   Alcohol Use Yes   Comment: BAC was clear     Social History   Substance and Sexual Activity  Drug Use Yes  . Types: Marijuana, Oxycodone   Comment: reports oxycodone and marijuana    Social History   Socioeconomic History  . Marital status: Single    Spouse name: Not on file  . Number of children: Not on file  . Years of education: Not on file  . Highest education level: Not on file  Occupational History  . Occupation: Unemployed  Tobacco Use  . Smoking status: Current Every Day Smoker    Packs/day: 0.50    Types: Cigarettes  . Smokeless tobacco: Never Used  . Tobacco comment: Not ready to quit  Substance and Sexual Activity  . Alcohol use: Yes    Comment: BAC was clear  . Drug use: Yes    Types: Marijuana, Oxycodone    Comment: reports oxycodone and marijuana  . Sexual activity: Yes    Birth control/protection: Implant  Other Topics Concern  . Not on file  Social History Narrative   06/23/2019:  Pt stated that she is homeless, that she is a high school graduate, and that she is unemployed and not followed by any outpatient provider.      Lives with Dad. Mom has LGD, lives in [place]. 11th grader. Dog.   Social Determinants of Health   Financial Resource Strain:   . Difficulty of Paying Living Expenses: Not on file  Food Insecurity:   . Worried About  Running Out of Food in the Last Year: Not on file  . Ran Out of Food in the Last Year: Not on file  Transportation Needs:   . Lack of Transportation (Medical): Not on file  . Lack of Transportation (Non-Medical): Not on file  Physical Activity:   . Days of Exercise per Week: Not on file  . Minutes of Exercise per Session: Not on file  Stress:   . Feeling of Stress : Not on file  Social Connections:   . Frequency of Communication with Friends and Family: Not on file  . Frequency of Social Gatherings with Friends and Family: Not on file  . Attends Religious Services: Not on file  . Active Member of Clubs or  Organizations: Not on file  . Attends Archivist Meetings: Not on file  . Marital Status: Not on file   Additional Social History:                         Sleep: Fair  Appetite:  Fair  Current Medications: Current Facility-Administered Medications  Medication Dose Route Frequency Provider Last Rate Last Admin  . acetaminophen (TYLENOL) tablet 650 mg  650 mg Oral Q6H PRN [name], [name], MD   650 mg at 08/22/19 0823  . benztropine (COGENTIN) tablet 1 mg  1 mg Oral BID [name], [name], MD   1 mg at 08/22/19 0820  . clonazePAM (KLONOPIN) tablet 2 mg  2 mg Oral Once [name], MD      . Derrill Memo ON 08/23/2019] FLUoxetine (PROZAC) capsule 20 mg  20 mg Oral Daily [name], MD      . gabapentin (NEURONTIN) capsule 300 mg  300 mg Oral TID [name], MD   300 mg at 08/22/19 0820  . haloperidol decanoate (HALDOL DECANOATE) 100 MG/ML injection 50 mg  50 mg Intramuscular Q30 days [name], John T, MD   50 mg at 08/18/19 1411  . ibuprofen (ADVIL) tablet 600 mg  600 mg Oral Q6H PRN [name], [name], MD   600 mg at 08/20/19 [id number]  . menthol-cetylpyridinium (CEPACOL) lozenge 3 mg  1 lozenge Oral PRN [name], [name], MD   3 mg at 08/21/19 1425  . paliperidone (INVEGA SUSTENNA) injection 156 mg  156 mg Intramuscular Q30 days [name], [name], MD   156 mg at 08/18/19 1410  . paliperidone (INVEGA) 24 hr tablet 3 mg  3 mg Oral Daily [name], MD      . traZODone (DESYREL) tablet 50 mg  50 mg Oral QHS [name], [name], MD   50 mg at 08/18/19 2128    Lab Results: No results found for this or any previous visit (from the past 64 hour(s)).  Blood Alcohol level:  Lab Results  Component Value Date   ETH <10 07/09/2019   ETH <10 [id number]    Metabolic Disorder Labs: Lab Results  Component Value Date   HGBA1C 5.2 05/17/2019   MPG 102.54 05/17/2019   MPG 108.28 02/17/2019   Lab Results  Component Value Date   PROLACTIN 31.9 (H) 02/17/2019   PROLACTIN 29.6 (H) 09/25/2016    Lab Results  Component Value Date   CHOL 144 05/17/2019   TRIG 65 05/17/2019   HDL 35 (L) 05/17/2019   CHOLHDL 4.1 05/17/2019   VLDL 13 05/17/2019   LDLCALC 96 05/17/2019   LDLCALC 76 02/17/2019    Musculoskeletal: Strength & Muscle Tone: within normal limits Gait & Station: normal  Patient leans: N/A  Psychiatric Specialty Exam: Physical Exam  Review of Systems  Blood pressure 114/76, pulse (!) 115, temperature 98.5 F (36.9 C), temperature source Oral, resp. rate 18, height 5\' 4"  (1.626 m), weight 67.2 kg, SpO2 97 %.Body mass index is 25.43 kg/m.  General Appearance: Disheveled  Eye Contact:  Good  Speech:  Clear and Coherent  Volume:  Increased  Mood:  Angry, Irritable and Self agitating  Affect:  Congruent and Labile  Thought Process:  Irrelevant and Descriptions of Associations: Circumstantial  Orientation:  Full (Time, Place, and Person)  Thought Content:  Delusions and Paranoid Ideation  Suicidal Thoughts:  No  Homicidal Thoughts:  Yes.  without intent/plan  Memory:  Immediate;   Poor Recent;   Fair Remote;   Fair  Judgement:  Impaired  Insight:  Lacking  Psychomotor Activity:  Normal  Concentration:  Concentration: Fair and Attention Span: Poor  Recall:  [employer] of Knowledge:  Poor  Language:  Fair  Akathisia:  Negative  Handed:  Right  AIMS (if indicated):     Assets:  Leisure Time Physical Health Resilience  ADL's:  Intact  Cognition:  WNL  Sleep:  Number of Hours: 7.5     Treatment Plan Summary: Daily contact with patient to assess and evaluate symptoms and progress in treatment and Medication management  Use lorazepam as needed but in the short run give clonazepam as a one-time dose for calming purposes Reinstitute low-dose paliperidone risk of temperature alterations discussed with the patient but risk-benefit side effects lost on her due to her agitated state, monitor for fever or even hypothermia which can occur with antipsychotics  associated 5-HT2A activity.  No change in precautions  Raysa Bosak, MD 08/22/2019, 8:53 AM

## 2019-08-22 NOTE — Plan of Care (Signed)
Patient stayed in bed sleeping. Did not need Trazodone.

## 2019-08-22 NOTE — Plan of Care (Signed)
D- Patient alert and oriented. Patient presents in an anxious, preoccupied mood on assessment stating that she slept good last night and is still complaining of a sore throat and wanting to be tested for Strept throat. Patient denies depression, but endorsed anxiety stating that "she's scared something is going to happen when I leave. I'm being tortured, he even knows my favorite color from when I was [age]". Patient also denies SI, HI, AVH at this time. Patient had no stated goals for today.  A- Scheduled medications administered to patient, per MD orders. Support and encouragement provided.  Routine safety checks conducted every 15 minutes.  Patient informed to notify staff with problems or concerns.  R- No adverse drug reactions noted. Patient contracts for safety at this time. Patient compliant with medications and treatment plan. Patient receptive, calm, and cooperative. Patient interacts well with others on the unit.  Patient remains safe at this time.  Problem: Education: Goal: Utilization of techniques to improve thought processes will improve Outcome: Not Progressing Goal: Knowledge of the prescribed therapeutic regimen will improve Outcome: Not Progressing   Problem: Activity: Goal: Interest or engagement in leisure activities will improve Outcome: Not Progressing Goal: Imbalance in normal sleep/wake cycle will improve Outcome: Not Progressing   Problem: Coping: Goal: Coping ability will improve Outcome: Not Progressing Goal: Will verbalize feelings Outcome: Not Progressing   Problem: Health Behavior/Discharge Planning: Goal: Ability to make decisions will improve Outcome: Not Progressing Goal: Compliance with therapeutic regimen will improve Outcome: Not Progressing   Problem: Role Relationship: Goal: Will demonstrate positive changes in social behaviors and relationships Outcome: Not Progressing   Problem: Safety: Goal: Ability to disclose and discuss suicidal ideas  will improve Outcome: Not Progressing Goal: Ability to identify and utilize support systems that promote safety will improve Outcome: Not Progressing   Problem: Self-Concept: Goal: Will verbalize positive feelings about self Outcome: Not Progressing Goal: Level of anxiety will decrease Outcome: Not Progressing   Problem: Education: Goal: Knowledge of the prescribed therapeutic regimen will improve Outcome: Not Progressing   Problem: Coping: Goal: Coping ability will improve Outcome: Not Progressing   Problem: Education: Goal: Knowledge of [facility] Education information/materials will improve Outcome: Not Progressing Goal: Emotional status will improve Outcome: Not Progressing

## 2019-08-23 DIAGNOSIS — F29 Unspecified psychosis not due to a substance or known physiological condition: Secondary | ICD-10-CM

## 2019-08-23 DIAGNOSIS — F200 Paranoid schizophrenia: Secondary | ICD-10-CM

## 2019-08-23 MED ORDER — ONDANSETRON 4 MG PO TBDP: 4.0000 mg | ORAL_TABLET | Freq: Four times a day (QID) | ORAL | Status: DC | PRN

## 2019-08-23 MED ORDER — GABAPENTIN 400 MG PO CAPS
400.0000 mg | ORAL_CAPSULE | Freq: Three times a day (TID) | ORAL | Status: DC
  Administered 2019-08-23 – 2019-08-25 (×7): 400 mg via ORAL
  Filled 2019-08-23 (×8): qty 1

## 2019-08-23 NOTE — Progress Notes (Signed)
Assumed care of patient at 1100, patient in bed resting eyes closed in no distress.

## 2019-08-23 NOTE — Plan of Care (Signed)
Patient is yelling and screaming in the hallway states "I am tortured. I need medicine."Redirected patient back to her room and encouraged to use coping skills.Patient calm down after some time.Compliant with medications.Appetite and energy level good.Support and encouragement given.

## 2019-08-23 NOTE — BHH Counselor (Signed)
CSW spoke with Falencio at [facility] and rescheduled the meeting for 08/24/2019 at [place] is for CST with [facility].  Falencio emailed paperwork for the patient to complete.   [name], MSW, LCSW 08/23/2019 11:15 AM

## 2019-08-23 NOTE — BHH Counselor (Signed)
CSW followed up with the potential group home [facility].  CSW spoke with [name].  Per [name] the QP has not reviewed the pts FL2 at this time.  She also reports that she would like for the patient to have [facility] involvement due to concerns with [facility].  CSW will let pt know of preference for [facility] at potential group home.  [name], MSW, LCSW 08/23/2019 11:17 AM

## 2019-08-23 NOTE — BHH Group Notes (Signed)
LCSW Group Therapy Note   08/23/2019 1:00 PM  Type of Therapy and Topic:  Group Therapy:  Overcoming Obstacles   Participation Level:  Did Not Attend   Description of Group:    In this group patients will be encouraged to explore what they see as obstacles to their own wellness and recovery. They will be guided to discuss their thoughts, feelings, and behaviors related to these obstacles. The group will process together ways to cope with barriers, with attention given to specific choices patients can make. Each patient will be challenged to identify changes they are motivated to make in order to overcome their obstacles. This group will be process-oriented, with patients participating in exploration of their own experiences as well as giving and receiving support and challenge from other group members.   Therapeutic Goals: 1. Patient will identify personal and current obstacles as they relate to admission. 2. Patient will identify barriers that currently interfere with their wellness or overcoming obstacles.  3. Patient will identify feelings, thought process and behaviors related to these barriers. 4. Patient will identify two changes they are willing to make to overcome these obstacles:      Summary of Patient Progress X   Therapeutic Modalities:   Cognitive Behavioral Therapy Solution Focused Therapy Motivational Interviewing Relapse Prevention Therapy  [name], MSW, LCSW 08/23/2019 12:47 PM

## 2019-08-23 NOTE — Progress Notes (Signed)
[facility] MD Progress Note  08/23/2019 11:14 AM Julia [name]  MRN:  [id number]   Subjective: Follow-up for this 19 year old female diagnosed with psychosis.  Patient is yelling and screaming and reporting that she is having hallucinations and that someone is controlling her like a human robot.  The patient states that she needs medications.  Patient is informed that she needs to work on her coping skills with her behavior.  Patient then states that she wants to have Klonopin to stop the voices patient is informed that Klonopin is not used for hallucinations and that at the current moment she will not continue the Klonopin.  Patient then becomes agitated and yelling and was cursing at times.  Patient continually demanding to have Klonopin given to her.  Patient was able to answer questions before she came agitated and denied having any suicidal or homicidal ideations.  Principal Problem: Psychosis ([place]) Diagnosis: Principal Problem:   Psychosis ([place]) Active Problems:   Polysubstance abuse ([place])   Depressed mood   Substance induced mood disorder ([facility])   Schizophrenia, paranoid ([place])  Total Time spent with patient: 30 minutes  Past Psychiatric History: See previous notes.  Multiple hospitalizations with worsening psychosis  Past Medical History:  Past Medical History:  Diagnosis Date  . Burning with urination 05/03/2015  . Contraceptive management 05/03/2015  . Depression   . Dysmenorrhea 12/30/2013  . Heroin addiction ([place])   . Menorrhagia 12/30/2013  . Menstrual extraction 12/30/2013  . Migraines   . Social anxiety disorder 09/25/2016  . Suicidal ideations   . Vaginal odor 05/03/2015    Past Surgical History:  Procedure Laterality Date  . NO PAST SURGERIES     Family History:  Family History  Problem Relation Age of Onset  . Depression Mother   . Hypertension Father   . Hyperlipidemia Father   . Cancer Paternal Grandmother        breast, uterine  . Cirrhosis Paternal Grandfather         due to alcohol   Family Psychiatric  History: See previous Social History:  Social History   Substance and Sexual Activity  Alcohol Use Yes   Comment: BAC was clear     Social History   Substance and Sexual Activity  Drug Use Yes  . Types: Marijuana, Oxycodone   Comment: reports oxycodone and marijuana    Social History   Socioeconomic History  . Marital status: Single    Spouse name: Not on file  . Number of children: Not on file  . Years of education: Not on file  . Highest education level: Not on file  Occupational History  . Occupation: Unemployed  Tobacco Use  . Smoking status: Current Every Day Smoker    Packs/day: 0.50    Types: Cigarettes  . Smokeless tobacco: Never Used  . Tobacco comment: Not ready to quit  Substance and Sexual Activity  . Alcohol use: Yes    Comment: BAC was clear  . Drug use: Yes    Types: Marijuana, Oxycodone    Comment: reports oxycodone and marijuana  . Sexual activity: Yes    Birth control/protection: Implant  Other Topics Concern  . Not on file  Social History Narrative   06/23/2019:  Pt stated that she is homeless, that she is a high school graduate, and that she is unemployed and not followed by any outpatient provider.      Lives with Dad. Mom has LGD, lives in [place]. 11th grader. Dog.   Social  Determinants of Health   Financial Resource Strain:   . Difficulty of Paying Living Expenses: Not on file  Food Insecurity:   . Worried About Charity fundraiser in the Last Year: Not on file  . Ran Out of Food in the Last Year: Not on file  Transportation Needs:   . Lack of Transportation (Medical): Not on file  . Lack of Transportation (Non-Medical): Not on file  Physical Activity:   . Days of Exercise per Week: Not on file  . Minutes of Exercise per Session: Not on file  Stress:   . Feeling of Stress : Not on file  Social Connections:   . Frequency of Communication with Friends and Family: Not on file  . Frequency of  Social Gatherings with Friends and Family: Not on file  . Attends Religious Services: Not on file  . Active Member of Clubs or Organizations: Not on file  . Attends Archivist Meetings: Not on file  . Marital Status: Not on file   Additional Social History:                         Sleep: Good  Appetite:  Good  Current Medications: Current Facility-Administered Medications  Medication Dose Route Frequency Provider Last Rate Last Admin  . acetaminophen (TYLENOL) tablet 650 mg  650 mg Oral Q6H PRN [name], [name], MD   650 mg at 08/23/19 0414  . benztropine (COGENTIN) tablet 1 mg  1 mg Oral BID [name], [name], MD   1 mg at 08/23/19 0829  . FLUoxetine (PROZAC) capsule 20 mg  20 mg Oral Daily [name], MD   20 mg at 08/23/19 0829  . gabapentin (NEURONTIN) capsule 400 mg  400 mg Oral TID Maryellen Dowdle, Darnelle Maffucci B, FNP      . haloperidol decanoate (HALDOL DECANOATE) 100 MG/ML injection 50 mg  50 mg Intramuscular Q30 days [name], John T, MD   50 mg at 08/18/19 1411  . ibuprofen (ADVIL) tablet 600 mg  600 mg Oral Q6H PRN [name], [name], MD   600 mg at 08/23/19 0829  . menthol-cetylpyridinium (CEPACOL) lozenge 3 mg  1 lozenge Oral PRN [name], [name], MD   3 mg at 08/22/19 1200  . ondansetron (ZOFRAN-ODT) disintegrating tablet 4 mg  4 mg Oral Q6H PRN Dixon, Rashaun M, NP      . paliperidone (INVEGA SUSTENNA) injection 156 mg  156 mg Intramuscular Q30 days [name], [name], MD   156 mg at 08/18/19 1410  . paliperidone (INVEGA) 24 hr tablet 3 mg  3 mg Oral Daily [name], MD   3 mg at 08/23/19 0829  . traZODone (DESYREL) tablet 50 mg  50 mg Oral QHS [name], [name], MD   50 mg at 08/18/19 2128    Lab Results: No results found for this or any previous visit (from the past 48 hour(s)).  Blood Alcohol level:  Lab Results  Component Value Date   ETH <10 07/09/2019   ETH <10 [id number]    Metabolic Disorder Labs: Lab Results  Component Value Date   HGBA1C 5.2 05/17/2019    MPG 102.54 05/17/2019   MPG 108.28 02/17/2019   Lab Results  Component Value Date   PROLACTIN 31.9 (H) 02/17/2019   PROLACTIN 29.6 (H) 09/25/2016   Lab Results  Component Value Date   CHOL 144 05/17/2019   TRIG 65 05/17/2019   HDL 35 (L) 05/17/2019   CHOLHDL 4.1  05/17/2019   VLDL 13 05/17/2019   LDLCALC 96 05/17/2019   LDLCALC 76 02/17/2019    Physical Findings: AIMS:  , ,  ,  ,    CIWA:    COWS:     Musculoskeletal: Strength & Muscle Tone: within normal limits Gait & Station: normal Patient leans: N/A  Psychiatric Specialty Exam: Physical Exam  Nursing note and vitals reviewed. Constitutional: She is oriented to person, place, and time. She appears well-developed and well-nourished.  Respiratory: Effort normal.  Musculoskeletal:        General: Normal range of motion.  Neurological: She is alert and oriented to person, place, and time.  Skin: Skin is warm.    Review of Systems  Constitutional: Negative.   HENT: Negative.   Eyes: Negative.   Respiratory: Negative.   Cardiovascular: Negative.   Gastrointestinal: Negative.   Genitourinary: Negative.   Musculoskeletal: Negative.   Skin: Negative.   Neurological: Negative.   Psychiatric/Behavioral: Positive for agitation, behavioral problems and hallucinations.    Blood pressure 114/76, pulse (!) 115, temperature 98.5 F (36.9 C), temperature source Oral, resp. rate 18, height 5\' 4"  (1.626 m), weight 67.2 kg, SpO2 97 %.Body mass index is 25.43 kg/m.  General Appearance: Casual  Eye Contact:  Good  Speech:  Clear and Coherent and Normal Rate  Volume:  Increased  Mood:  Angry and Irritable  Affect:  Congruent  Thought Process:  Coherent and Descriptions of Associations: Intact  Orientation:  Full (Time, Place, and Person)  Thought Content:  WDL and reports AH but does not appear to be responding  Suicidal Thoughts:  No  Homicidal Thoughts:  No  Memory:  Immediate;   Good Recent;   Good Remote;   Good   Judgement:  Fair  Insight:  Good  Psychomotor Activity:  Normal  Concentration:  Concentration: Good  Recall:  Good  Fund of Knowledge:  Fair  Language:  Good  Akathisia:  No  Handed:  Right  AIMS (if indicated):     Assets:  Communication Skills Desire for Improvement Physical Health  ADL's:  Intact  Cognition:  WNL  Sleep:  Number of Hours: 5.45   Assessment: Patient presents in room after yelling in the hallway demanding some medication.  Patient is asking for Klonopin and making statements that she was upset and mad and that the voices are telling her things that she is a human robot and that she needs Klonopin to get rid of the voices.  Patient continues to be demanding and yelling and screaming.  Patient was not provided with Klonopin and eventually went to her room and did quite and down and calm down.  It does appear that the patient may be presenting behavior and will attempt to get additional medication specifically controlled substances.  Otherwise patient has not presented with any other behavior or aggression.  Patient continued reporting having auditory hallucinations and feeling like she was controlled like a robot, however patient does not present as though she is responding to any internal or external stimuli and is having logical conversation with all staff about why she needs to have Klonopin and also was able to repeat back the same information that I informed her of to another staff member that she was not going to get it in the possibilities of someone prescribing it to her outside the hospital would be slim.  To assist with her anxiety and her agitation will increase the gabapentin to 400 mg p.o. 3 times daily.  Social worker informed [name] that they are waiting to hear from the group home for housing for this patient and hopefully will hear something back today.  Treatment Plan Summary: Daily contact with patient to assess and evaluate symptoms and progress in treatment and  Medication management  Continue Cogentin 1 mg p.o. twice daily for EPS Continue Prozac 20 mg p.o. daily for depression and anxiety Increase Neurontin 400 mg p.o. 3 times daily for agitation and mood stability Continue Invega Sustenna 156 mg IM every 30 days for psychosis Continue Haldol Decanoate 50 mg IM every 30 days for psychosis Continue Invega 3 mg p.o. daily for psychosis Continue trazodone 50 mg p.o. nightly for insomnia Encourage group therapy participation Continue every 15 minute safety checks  Lowry Ram Ardelia Wrede, FNP 08/23/2019, 11:14 AM

## 2019-08-23 NOTE — Plan of Care (Signed)
  Problem: Safety: Goal: Ability to disclose and discuss suicidal ideas will improve Outcome: Progressing   

## 2019-08-24 NOTE — BHH Counselor (Signed)
CSW made two unsuccessful attempts to contact Julia [name] to follow up about  group home placement. CSW unable to leave confidential vm as the phone rings repeatedly and no vm pick up. CSW will continue to try and reach [name].

## 2019-08-24 NOTE — BHH Counselor (Signed)
CSW spoke with Minna Antis, Team Lead CST with [facility] to cancel intake appointment.  Pt requested to continue with ACT with [facility].  [name], MSW, LCSW 08/24/2019 9:57 AM

## 2019-08-24 NOTE — Progress Notes (Signed)
Patient is asleep right now, so this writer held her afternoon medication, and will administer medications this evening. MD will be notified.

## 2019-08-24 NOTE — Plan of Care (Signed)
D- Patient alert and oriented. Patient presents in a pleasant mood on assessment stating that she slept ok last night. Patient continues to complain of throat pain, rating it an "8/10", in which she did request pain medication from this Probation officer. Patient denies any signs/symptoms of depression/anxiety, reporting that overall she is feeling "good". Patient also denies SI, HI, AVH, at this time. Patient had no stated goals for today.   A- Scheduled medications administered to patient, per MD orders. Support and encouragement provided.  Routine safety checks conducted every 15 minutes.  Patient informed to notify staff with problems or concerns.  R- No adverse drug reactions noted. Patient contracts for safety at this time. Patient compliant with medications and treatment plan. Patient receptive, calm, and cooperative. Patient interacts well with others on the unit.  Patient remains safe at this time.  Problem: Education: Goal: Utilization of techniques to improve thought processes will improve Outcome: Progressing Goal: Knowledge of the prescribed therapeutic regimen will improve Outcome: Progressing   Problem: Activity: Goal: Interest or engagement in leisure activities will improve Outcome: Progressing Goal: Imbalance in normal sleep/wake cycle will improve Outcome: Progressing   Problem: Coping: Goal: Coping ability will improve Outcome: Progressing Goal: Will verbalize feelings Outcome: Progressing   Problem: Health Behavior/Discharge Planning: Goal: Ability to make decisions will improve Outcome: Progressing Goal: Compliance with therapeutic regimen will improve Outcome: Progressing   Problem: Role Relationship: Goal: Will demonstrate positive changes in social behaviors and relationships Outcome: Progressing   Problem: Safety: Goal: Ability to disclose and discuss suicidal ideas will improve Outcome: Progressing Goal: Ability to identify and utilize support systems that promote  safety will improve Outcome: Progressing   Problem: Self-Concept: Goal: Will verbalize positive feelings about self Outcome: Progressing Goal: Level of anxiety will decrease Outcome: Progressing   Problem: Education: Goal: Knowledge of the prescribed therapeutic regimen will improve Outcome: Progressing   Problem: Coping: Goal: Coping ability will improve Outcome: Progressing   Problem: Education: Goal: Knowledge of [place] [facility] Education information/materials will improve Outcome: Progressing Goal: Emotional status will improve Outcome: Progressing

## 2019-08-24 NOTE — Progress Notes (Signed)
[facility] MD Progress Note  08/24/2019 2:07 PM Julia [name]  MRN:  [id number] Subjective: Follow-up for this 19 year old with psychosis most likely schizophrenia and chronic behavioral problems.  The last couple days her behavior has been under pretty good control.  She will still occasionally have spells of shouting often in the afternoon.  Staff mostly are able to redirect her easily and it is becoming more clear that this may be behavioral in nature rather than a specific symptom driven problem.  Patient is taking care of her hygiene adequately.  Not getting aggressive not violent.  We are awaiting the final coming together of the plan for disposition Principal Problem: Psychosis ([place]) Diagnosis: Principal Problem:   Psychosis ([place]) Active Problems:   Polysubstance abuse ([place])   Depressed mood   Substance induced mood disorder ([facility])   Schizophrenia, paranoid ([place])  Total Time spent with patient: 30 minutes  Past Psychiatric History: Past history of longstanding mental health problems going back to childhood  Past Medical History:  Past Medical History:  Diagnosis Date  . Burning with urination 05/03/2015  . Contraceptive management 05/03/2015  . Depression   . Dysmenorrhea 12/30/2013  . Heroin addiction ([place])   . Menorrhagia 12/30/2013  . Menstrual extraction 12/30/2013  . Migraines   . Social anxiety disorder 09/25/2016  . Suicidal ideations   . Vaginal odor 05/03/2015    Past Surgical History:  Procedure Laterality Date  . NO PAST SURGERIES     Family History:  Family History  Problem Relation Age of Onset  . Depression Mother   . Hypertension Father   . Hyperlipidemia Father   . Cancer Paternal Grandmother        breast, uterine  . Cirrhosis Paternal Grandfather        due to alcohol   Family Psychiatric  History: See previous Social History:  Social History   Substance and Sexual Activity  Alcohol Use Yes   Comment: BAC was clear     Social History   Substance  and Sexual Activity  Drug Use Yes  . Types: Marijuana, Oxycodone   Comment: reports oxycodone and marijuana    Social History   Socioeconomic History  . Marital status: Single    Spouse name: Not on file  . Number of children: Not on file  . Years of education: Not on file  . Highest education level: Not on file  Occupational History  . Occupation: Unemployed  Tobacco Use  . Smoking status: Current Every Day Smoker    Packs/day: 0.50    Types: Cigarettes  . Smokeless tobacco: Never Used  . Tobacco comment: Not ready to quit  Substance and Sexual Activity  . Alcohol use: Yes    Comment: BAC was clear  . Drug use: Yes    Types: Marijuana, Oxycodone    Comment: reports oxycodone and marijuana  . Sexual activity: Yes    Birth control/protection: Implant  Other Topics Concern  . Not on file  Social History Narrative   06/23/2019:  Pt stated that she is homeless, that she is a high school graduate, and that she is unemployed and not followed by any outpatient provider.      Lives with Dad. Mom has LGD, lives in [place]. 11th grader. Dog.   Social Determinants of Health   Financial Resource Strain:   . Difficulty of Paying Living Expenses: Not on file  Food Insecurity:   . Worried About Charity fundraiser in the Last Year: Not on  file  . [place] in the Last Year: Not on file  Transportation Needs:   . Lack of Transportation (Medical): Not on file  . Lack of Transportation (Non-Medical): Not on file  Physical Activity:   . Days of Exercise per Week: Not on file  . Minutes of Exercise per Session: Not on file  Stress:   . Feeling of Stress : Not on file  Social Connections:   . Frequency of Communication with Friends and Family: Not on file  . Frequency of Social Gatherings with Friends and Family: Not on file  . Attends Religious Services: Not on file  . Active Member of Clubs or Organizations: Not on file  . Attends Archivist Meetings: Not on file  .  Marital Status: Not on file   Additional Social History:                         Sleep: Fair  Appetite:  Fair  Current Medications: Current Facility-Administered Medications  Medication Dose Route Frequency Provider Last Rate Last Admin  . acetaminophen (TYLENOL) tablet 650 mg  650 mg Oral Q6H PRN Rhyan Radler, [name], MD   650 mg at 08/23/19 1644  . benztropine (COGENTIN) tablet 1 mg  1 mg Oral BID Carman Auxier, [name], MD   1 mg at 08/24/19 [id number]  . FLUoxetine (PROZAC) capsule 20 mg  20 mg Oral Daily [name], MD   20 mg at 08/24/19 [id number]  . gabapentin (NEURONTIN) capsule 400 mg  400 mg Oral TID Money, Darnelle Maffucci B, FNP   400 mg at 08/24/19 [id number]  . haloperidol decanoate (HALDOL DECANOATE) 100 MG/ML injection 50 mg  50 mg Intramuscular Q30 days Amare Bail T, MD   50 mg at 08/18/19 1411  . ibuprofen (ADVIL) tablet 600 mg  600 mg Oral Q6H PRN Mickie Badders, [name], MD   600 mg at 08/24/19 [id number]  . menthol-cetylpyridinium (CEPACOL) lozenge 3 mg  1 lozenge Oral PRN Reilley Valentine, [name], MD   3 mg at 08/23/19 1842  . ondansetron (ZOFRAN-ODT) disintegrating tablet 4 mg  4 mg Oral Q6H PRN Dixon, Rashaun M, NP      . paliperidone (INVEGA SUSTENNA) injection 156 mg  156 mg Intramuscular Q30 days Makalah Asberry, [name], MD   156 mg at 08/18/19 1410  . paliperidone (INVEGA) 24 hr tablet 3 mg  3 mg Oral Daily [name], MD   3 mg at 08/24/19 [id number]  . traZODone (DESYREL) tablet 50 mg  50 mg Oral QHS Ladarrian Asencio, [name], MD   50 mg at 08/23/19 2123    Lab Results: No results found for this or any previous visit (from the past 48 hour(s)).  Blood Alcohol level:  Lab Results  Component Value Date   ETH <10 07/09/2019   ETH <10 [id number]    Metabolic Disorder Labs: Lab Results  Component Value Date   HGBA1C 5.2 05/17/2019   MPG 102.54 05/17/2019   MPG 108.28 02/17/2019   Lab Results  Component Value Date   PROLACTIN 31.9 (H) 02/17/2019   PROLACTIN 29.6 (H) 09/25/2016   Lab Results  Component Value Date    CHOL 144 05/17/2019   TRIG 65 05/17/2019   HDL 35 (L) 05/17/2019   CHOLHDL 4.1 05/17/2019   VLDL 13 05/17/2019   LDLCALC 96 05/17/2019   LDLCALC 76 02/17/2019    Physical Findings: AIMS:  , ,  ,  ,    CIWA:  COWS:     Musculoskeletal: Strength & Muscle Tone: within normal limits Gait & Station: normal Patient leans: N/A  Psychiatric Specialty Exam: Physical Exam  Nursing note and vitals reviewed. Constitutional: She appears well-developed and well-nourished.  HENT:  Head: Normocephalic and atraumatic.  Eyes: Pupils are equal, round, and reactive to light. Conjunctivae are normal.  Cardiovascular: Regular rhythm and normal heart sounds.  Respiratory: Effort normal. No respiratory distress.  GI: Soft.  Musculoskeletal:        General: Normal range of motion.     Cervical back: Normal range of motion.  Neurological: She is alert.  Skin: Skin is warm and dry.  Psychiatric: She has a normal mood and affect. Her behavior is normal. Judgment and thought content normal.    Review of Systems  Constitutional: Negative.   HENT: Negative.   Eyes: Negative.   Respiratory: Negative.   Cardiovascular: Negative.   Gastrointestinal: Negative.   Musculoskeletal: Negative.   Skin: Negative.   Neurological: Negative.   Psychiatric/Behavioral: Negative.     Blood pressure 98/63, pulse (!) 107, temperature 98.9 F (37.2 C), temperature source Oral, resp. rate 18, height 5\' 4"  (1.626 m), weight 67.2 kg, SpO2 98 %.Body mass index is 25.43 kg/m.  General Appearance: Casual  Eye Contact:  Fair  Speech:  Clear and Coherent  Volume:  Normal  Mood:  Euthymic  Affect:  Constricted  Thought Process:  Coherent  Orientation:  Full (Time, Place, and Person)  Thought Content:  Logical  Suicidal Thoughts:  No  Homicidal Thoughts:  No  Memory:  Immediate;   Fair Recent;   Fair Remote;   Fair  Judgement:  Fair  Insight:  Fair  Psychomotor Activity:  Normal  Concentration:   Concentration: Fair  Recall:  [employer] of Knowledge:  Fair  Language:  Fair  Akathisia:  No  Handed:  Right  AIMS (if indicated):     Assets:  Desire for Improvement Physical Health Resilience  ADL's:  Intact  Cognition:  WNL  Sleep:  Number of Hours: 8.25     Treatment Plan Summary: Daily contact with patient to assess and evaluate symptoms and progress in treatment, Medication management and Plan Treatment team yesterday discussed whether adding medicine would be of any benefit but I think at this point she is stable enough that the risk and cost is not worth making a change.  She is settling into the long-acting Invega and is currently just on 3 mg oral.  Does not appear to be nearly as stiff and not having notable akathisia.  Lots of support and encouragement to the patient that we are hoping that we will have a plan come together very soon for discharge.  Alethia Berthold, MD 08/24/2019, 2:07 PM

## 2019-08-24 NOTE — [facility] Counselor (Signed)
CSW attempted to call Josepha Pigg of [facility] group home to follow up on possible placement.  Call was not answered and CSW was unable to leave a voicemail, the line rang without end.   CSW called at 12PM as well to the same results.  [name], MSW, LCSW 08/24/2019 3:22 PM

## 2019-08-24 NOTE — BHH Group Notes (Signed)

## 2019-08-24 NOTE — Progress Notes (Signed)
CSW spoke with [name] from [facility] who reported that she spoke with Spero Geralds late last night who reported that pt was discharging on Wednesday to her group home and [name] was waiting for [facility] to get the paperwork together. CSW explained that CSW team has been waiting for confirmation from [facility] as to whether or not she was going to take pt and when she could pick pt up. According to Dion Saucier reported she was getting pt on Wednesday and reported [name] said she was taking pt into her group home.   Evalina Field, MSW, LCSW Clinical Social Work 08/24/2019 10:34 AM

## 2019-08-25 MED ORDER — FLUOXETINE HCL 20 MG PO CAPS: 20.0000 mg | ORAL_CAPSULE | Freq: Every day | ORAL | 0 refills | Status: DC

## 2019-08-25 MED ORDER — PALIPERIDONE ER 3 MG PO TB24: 3.0000 mg | ORAL_TABLET | Freq: Every day | ORAL | 1 refills | Status: DC

## 2019-08-25 MED ORDER — HALOPERIDOL DECANOATE 100 MG/ML IM SOLN: 50.0000 mg | INTRAMUSCULAR | 1 refills | Status: DC

## 2019-08-25 MED ORDER — FLUOXETINE HCL 20 MG PO CAPS: 20.0000 mg | ORAL_CAPSULE | Freq: Every day | ORAL | 1 refills | Status: DC

## 2019-08-25 MED ORDER — PALIPERIDONE ER 3 MG PO TB24: 3.0000 mg | ORAL_TABLET | Freq: Every day | ORAL | 0 refills | Status: DC

## 2019-08-25 MED ORDER — TRAZODONE HCL 50 MG PO TABS: 50.0000 mg | ORAL_TABLET | Freq: Every day | ORAL | 0 refills | Status: DC

## 2019-08-25 MED ORDER — GABAPENTIN 400 MG PO CAPS: 400.0000 mg | ORAL_CAPSULE | Freq: Three times a day (TID) | ORAL | 1 refills | Status: DC

## 2019-08-25 MED ORDER — GABAPENTIN 400 MG PO CAPS: 400.0000 mg | ORAL_CAPSULE | Freq: Three times a day (TID) | ORAL | 0 refills | Status: DC

## 2019-08-25 MED ORDER — TRAZODONE HCL 50 MG PO TABS: 50.0000 mg | ORAL_TABLET | Freq: Every day | ORAL | 1 refills | Status: DC

## 2019-08-25 MED ORDER — PALIPERIDONE PALMITATE ER 156 MG/ML IM SUSY: 156.0000 mg | PREFILLED_SYRINGE | INTRAMUSCULAR | 1 refills | Status: DC

## 2019-08-25 MED ORDER — BENZTROPINE MESYLATE 1 MG PO TABS: 1.0000 mg | ORAL_TABLET | Freq: Two times a day (BID) | ORAL | 1 refills | Status: DC

## 2019-08-25 MED ORDER — BENZTROPINE MESYLATE 1 MG PO TABS: 1.0000 mg | ORAL_TABLET | Freq: Two times a day (BID) | ORAL | 0 refills | Status: DC

## 2019-08-25 NOTE — [facility] Counselor (Signed)
CSW attempted to call Julia [name] at hte potential group home.  CSW was unable to speak with Julia [name] or leave HIPAA compliant voicemail.  [name], MSW, LCSW 08/25/2019 8:26 AM

## 2019-08-25 NOTE — [facility] Group Notes (Signed)
LCSW Group Therapy Note  08/25/2019 11:51 AM  Type of Therapy/Topic:  Group Therapy:  Emotion Regulation  Participation Level:  Minimal   Description of Group:   The purpose of this group is to assist patients in learning to regulate negative emotions and experience positive emotions. Patients will be guided to discuss ways in which they have been vulnerable to their negative emotions. These vulnerabilities will be juxtaposed with experiences of positive emotions or situations, and patients will be challenged to use positive emotions to combat negative ones. Special emphasis will be placed on coping with negative emotions in conflict situations, and patients will process healthy conflict resolution skills.  Therapeutic Goals: 1. Patient will identify two positive emotions or experiences to reflect on in order to balance out negative emotions 2. Patient will label two or more emotions that they find the most difficult to experience 3. Patient will demonstrate positive conflict resolution skills through discussion and/or role plays  Summary of Patient Progress: Pt was present in group but did not engage much in the group discussion. Pt did report that her goal for today was being discharged and reported she was happy to be scheduled for discharge today.   Therapeutic Modalities:   Cognitive Behavioral Therapy Feelings Identification Dialectical Behavioral Therapy   Evalina Field, MSW, LCSW Clinical Social Work 08/25/2019 11:51 AM

## 2019-08-25 NOTE — Progress Notes (Signed)
Recreation Therapy Notes  INPATIENT RECREATION TR PLAN  Patient Details Name: Julia [name] MRN: [id number] DOB: 04/30/2001 Today's Date: 08/25/2019  Rec Therapy Plan Is patient appropriate for Therapeutic Recreation?: Yes Treatment times per week: at least 3 Estimated Length of Stay: 5-7 days TR Treatment/Interventions: Group participation (Comment)  Discharge Criteria Pt will be discharged from therapy if:: Discharged Treatment plan/goals/alternatives discussed and agreed upon by:: Patient/family  Discharge Summary Short term goals set: Patient will engage in groups without prompting or encouragement from LRT x3 group sessions within 5 recreation therapy group sessions Short term goals met: Adequate for discharge Progress toward goals comments: Groups attended Which groups?: Other (Comment)(Team work, Happiness, Relaxation, Necessities, Problem Solving) Reason goals not met: N/A Therapeutic equipment acquired: N/A Reason patient discharged from therapy: Discharge from hospital Pt/family agrees with progress & goals achieved: Yes Date patient discharged from therapy: 08/25/19   Lillan Mccreadie 08/25/2019, 4:17 PM

## 2019-08-25 NOTE — Discharge Summary (Signed)
Physician Discharge Summary Note  Patient:  Julia [name] is an 19 y.o., female MRN:  [id number] DOB:  2001-05-18 Patient phone:  [phone] (home)  Patient address:   [place] [address],  Total Time spent with patient: 30 minutes  Date of Admission:  07/10/2019 Date of Discharge: August 25, 2019  Reason for Admission: Patient was admitted with worsening psychosis behavior problems suicidal ideation disorganized behavior  Principal Problem: Psychosis [facility]) Discharge Diagnoses: Principal Problem:   Psychosis ([place]) Active Problems:   Polysubstance abuse ([place])   Depressed mood   Substance induced mood disorder ([place])   Schizophrenia, paranoid ([place])   Past Psychiatric History: She has a long history of mental health and behavior problems.  Started to be treated for substance abuse and behavior issues around age 68.  Multiple childhood and adolescent admissions.  In the last year or 2 has developed more persistent psychotic symptoms.  She does have a past history of suicidality and poor self-care.  Past Medical History:  Past Medical History:  Diagnosis Date  . Burning with urination 05/03/2015  . Contraceptive management 05/03/2015  . Depression   . Dysmenorrhea 12/30/2013  . Heroin addiction ([place])   . Menorrhagia 12/30/2013  . Menstrual extraction 12/30/2013  . Migraines   . Social anxiety disorder 09/25/2016  . Suicidal ideations   . Vaginal odor 05/03/2015    Past Surgical History:  Procedure Laterality Date  . NO PAST SURGERIES     Family History:  Family History  Problem Relation Age of Onset  . Depression Mother   . Hypertension Father   . Hyperlipidemia Father   . Cancer Paternal Grandmother        breast, uterine  . Cirrhosis Paternal Grandfather        due to alcohol   Family Psychiatric  History: See previous.  Evidently mother had severe mental illness and killed her self. Social History:  Social History   Substance and Sexual Activity   Alcohol Use Yes   Comment: BAC was clear     Social History   Substance and Sexual Activity  Drug Use Yes  . Types: Marijuana, Oxycodone   Comment: reports oxycodone and marijuana    Social History   Socioeconomic History  . Marital status: Single    Spouse name: Not on file  . Number of children: Not on file  . Years of education: Not on file  . Highest education level: Not on file  Occupational History  . Occupation: Unemployed  Tobacco Use  . Smoking status: Current Every Day Smoker    Packs/day: 0.50    Types: Cigarettes  . Smokeless tobacco: Never Used  . Tobacco comment: Not ready to quit  Substance and Sexual Activity  . Alcohol use: Yes    Comment: BAC was clear  . Drug use: Yes    Types: Marijuana, Oxycodone    Comment: reports oxycodone and marijuana  . Sexual activity: Yes    Birth control/protection: Implant  Other Topics Concern  . Not on file  Social History Narrative   06/23/2019:  Pt stated that she is homeless, that she is a high school graduate, and that she is unemployed and not followed by any outpatient provider.      Lives with Dad. Mom has LGD, lives in [place]. 11th grader. Dog.   Social Determinants of Health   Financial Resource Strain:   . Difficulty of Paying Living Expenses: Not on file  Food Insecurity:   .  Worried About Charity fundraiser in the Last Year: Not on file  . Ran Out of Food in the Last Year: Not on file  Transportation Needs:   . Lack of Transportation (Medical): Not on file  . Lack of Transportation (Non-Medical): Not on file  Physical Activity:   . Days of Exercise per Week: Not on file  . Minutes of Exercise per Session: Not on file  Stress:   . Feeling of Stress : Not on file  Social Connections:   . Frequency of Communication with Friends and Family: Not on file  . Frequency of Social Gatherings with Friends and Family: Not on file  . Attends Religious Services: Not on file  . Active Member of Clubs or  Organizations: Not on file  . Attends Archivist Meetings: Not on file  . Marital Status: Not on file    Hospital Course: Patient had a lengthy hospital stay in part because of intractable symptoms and in part because of difficulty finding disposition.  She had significant lability of her mood and behavior.  She would have spells throughout her hospital stay of denying symptoms and behaving fairly well punctuated by spells of becoming very agitated and hostile.  Treatment team has reviewed this multiple times and believe that at least part of this is behavioral and at times his attention her medicine seeking.  We have tried several medications for what appears to be a paranoid psychotic disorder with hallucinations.  Patient ultimately was agreeable to long-acting injectables.  She is now on both long acting Invega and Haldol decanoate both of which she will be due for her next shot on March 3.  She is also still taking a modest oral dose of Invega.  Still taking Prozac.  In the last several days behavior has generally improved quite a bit.  Disposition has been complicated by lack of funding.  Patient does not receive any kind of disability support or funding at all.  No family willing to take her in.  No shelters willing to take her.  Ultimately social work was able to locate a group home manager willing to take the patient on for now with the promise of disability payment coming up in the future.  Patient is agreeable to the plan.  She will be discharged this afternoon and will have follow-up arranged after she leaves.  Physical Findings: AIMS:  , ,  ,  ,    CIWA:    COWS:     Musculoskeletal: Strength & Muscle Tone: within normal limits Gait & Station: normal Patient leans: N/A  Psychiatric Specialty Exam: Physical Exam  Nursing note and vitals reviewed. Constitutional: She appears well-developed and well-nourished.  HENT:  Head: Normocephalic and atraumatic.  Eyes: Pupils are  equal, round, and reactive to light. Conjunctivae are normal.  Cardiovascular: Regular rhythm and normal heart sounds.  Respiratory: Effort normal.  GI: Soft.  Musculoskeletal:        General: Normal range of motion.     Cervical back: Normal range of motion.  Neurological: She is alert.  Skin: Skin is warm and dry.  Psychiatric: Her affect is blunt. Her speech is delayed. She is slowed. Cognition and memory are impaired. She expresses impulsivity. She expresses no homicidal and no suicidal ideation.    Review of Systems  Constitutional: Negative.   HENT: Negative.   Eyes: Negative.   Respiratory: Negative.   Cardiovascular: Negative.   Gastrointestinal: Negative.   Musculoskeletal: Negative.   Skin:  Negative.   Neurological: Negative.   Psychiatric/Behavioral: Negative.     Blood pressure 98/63, pulse (!) 107, temperature 98.9 F (37.2 C), temperature source Oral, resp. rate 18, height 5\' 4"  (1.626 m), weight 67.2 kg, SpO2 98 %.Body mass index is 25.43 kg/m.  General Appearance: Casual  Eye Contact:  Fair  Speech:  Slow  Volume:  Decreased  Mood:  Euthymic  Affect:  Constricted  Thought Process:  Coherent  Orientation:  Full (Time, Place, and Person)  Thought Content:  Logical  Suicidal Thoughts:  No  Homicidal Thoughts:  No  Memory:  Immediate;   Fair Recent;   Fair Remote;   Fair  Judgement:  Impaired  Insight:  Shallow  Psychomotor Activity:  Normal  Concentration:  Concentration: Fair  Recall:  [place] of Knowledge:  Fair  Language:  Fair  Akathisia:  No  Handed:  Right  AIMS (if indicated):     Assets:  Desire for Improvement Physical Health Resilience  ADL's:  Impaired  Cognition:  Impaired,  Mild  Sleep:  Number of Hours: 7.5     Have you used any form of tobacco in the last 30 days? (Cigarettes, Smokeless Tobacco, Cigars, and/or Pipes): Yes  Has this patient used any form of tobacco in the last 30 days? (Cigarettes, Smokeless Tobacco, Cigars,  and/or Pipes) Yes, Yes, A prescription for an FDA-approved tobacco cessation medication was offered at discharge and the patient refused  Blood Alcohol level:  Lab Results  Component Value Date   [facility] <10 07/09/2019   ETH <10 [id number]    Metabolic Disorder Labs:  Lab Results  Component Value Date   HGBA1C 5.2 05/17/2019   MPG 102.54 05/17/2019   MPG 108.28 02/17/2019   Lab Results  Component Value Date   PROLACTIN 31.9 (H) 02/17/2019   PROLACTIN 29.6 (H) 09/25/2016   Lab Results  Component Value Date   CHOL 144 05/17/2019   TRIG 65 05/17/2019   HDL 35 (L) 05/17/2019   CHOLHDL 4.1 05/17/2019   VLDL 13 05/17/2019   LDLCALC 96 05/17/2019   LDLCALC 76 02/17/2019    See Psychiatric Specialty Exam and Suicide Risk Assessment completed by Attending Physician prior to discharge.  Discharge destination:  Home  Is patient on multiple antipsychotic therapies at discharge:  Yes,   Do you recommend tapering to monotherapy for antipsychotics?  No   Has Patient had three or more failed trials of antipsychotic monotherapy by history:  Yes,   Antipsychotic medications that previously failed include:   1.  Zyprexa., 2.  Risperdal. and 3.  Invega.  Recommended Plan for Multiple Antipsychotic Therapies: Additional reason(s) for multiple antispychotic treatment:  Patient has failed 3 different trials of monotherapy and has persistent disorganized thinking and reports of hallucinations.  Seems to be better controlled with 2 antipsychotics as she is on now and is tolerating them well. NA  Discharge Instructions    Diet - low sodium heart healthy   Complete by: As directed    Increase activity slowly   Complete by: As directed      Allergies as of 08/25/2019      Reactions   Abilify [aripiprazole]    Zoloft [sertraline Hcl]       Medication List    STOP taking these medications   etonogestrel 68 MG Impl implant Commonly known as: Nexplanon   hydrOXYzine 25 MG tablet Commonly  known as: ATARAX/VISTARIL   QUEtiapine 50 MG tablet Commonly known as: SEROQUEL  TAKE these medications     Indication  benztropine 1 MG tablet Commonly known as: COGENTIN Take 1 tablet (1 mg total) by mouth 2 (two) times daily.  Indication: Extrapyramidal Reaction caused by Medications   FLUoxetine 20 MG capsule Commonly known as: PROZAC Take 1 capsule (20 mg total) by mouth daily. Start taking on: August 26, 2019 What changed:   medication strength  how much to take  Indication: Depression   gabapentin 400 MG capsule Commonly known as: NEURONTIN Take 1 capsule (400 mg total) by mouth 3 (three) times daily.  Indication: Social Anxiety Disorder   haloperidol decanoate 100 MG/ML injection Commonly known as: HALDOL DECANOATE Inject 0.5 mLs (50 mg total) into the muscle every 30 (thirty) days. NEXT DOSE DUE AROUND September 15, 2019 Start taking on: September 17, 2019  Indication: Schizophrenia   paliperidone 156 MG/ML Susy injection Commonly known as: INVEGA SUSTENNA Inject 1 mL (156 mg total) into the muscle every 30 (thirty) days. NEXT DOSE DUE AROUND September 15, 2019 Start taking on: September 17, 2019  Indication: Schizophrenia   paliperidone 3 MG 24 hr tablet Commonly known as: INVEGA Take 1 tablet (3 mg total) by mouth daily. Start taking on: August 26, 2019  Indication: Schizophrenia   traZODone 50 MG tablet Commonly known as: DESYREL Take 1 tablet (50 mg total) by mouth at bedtime. What changed:   when to take this  reasons to take this  additional instructions  Indication: [place]. Follow up on 08/30/2019.   Why: You have been accepted for ACT services, they will follow up with you at the group home. You have a psychiatry appointment scheduled for Monday 08/30/19 at 10:30am. Thank You! Contact information: [place] [address] [phone]            Follow-up recommendations:  Activity:  Activity as tolerated Diet:  Regular diet Other:  Follow-up with the [facility] act team  Comments: Prescriptions given at discharge including prescriptions for injectables  Signed: Alethia Berthold, MD 08/25/2019, 3:46 PM

## 2019-08-25 NOTE — Plan of Care (Signed)
  Problem: Education: Goal: Utilization of techniques to improve thought processes will improve Outcome: Adequate for Discharge Goal: Knowledge of the prescribed therapeutic regimen will improve Outcome: Adequate for Discharge   Problem: Activity: Goal: Interest or engagement in leisure activities will improve Outcome: Adequate for Discharge Goal: Imbalance in normal sleep/wake cycle will improve Outcome: Adequate for Discharge   Problem: Coping: Goal: Coping ability will improve Outcome: Adequate for Discharge Goal: Will verbalize feelings Outcome: Adequate for Discharge   Problem: Health Behavior/Discharge Planning: Goal: Ability to make decisions will improve Outcome: Adequate for Discharge Goal: Compliance with therapeutic regimen will improve Outcome: Adequate for Discharge   Problem: Safety: Goal: Ability to disclose and discuss suicidal ideas will improve Outcome: Adequate for Discharge Goal: Ability to identify and utilize support systems that promote safety will improve Outcome: Adequate for Discharge   Problem: Self-Concept: Goal: Will verbalize positive feelings about self Outcome: Adequate for Discharge   Problem: Education: Goal: Knowledge of the prescribed therapeutic regimen will improve Outcome: Adequate for Discharge   Problem: Coping: Goal: Coping ability will improve Outcome: Adequate for Discharge   Problem: Education: Goal: Knowledge of  [facility] Education information/materials will improve Outcome: Adequate for Discharge Goal: Emotional status will improve Outcome: Adequate for Discharge

## 2019-08-25 NOTE — Progress Notes (Signed)
CSW attempted to call Josepha Pigg at the potential group home.  CSW was unable to speak with [name] or leave HIPAA compliant voicemail. CSW team will continue to try to contact [name] with potential discharge date for pt.   Evalina Field, MSW, LCSW Clinical Social Work 08/25/2019 10:04 AM

## 2019-08-25 NOTE — Progress Notes (Signed)
  [facility] Adult Case Management Discharge Plan :  Will you be returning to the same living situation after discharge:  No. Pt going to [facility] group home At discharge, do you have transportation home?: Yes,  Group home will pick pt up Do you have the ability to pay for your medications: Yes,  mental health, medicaid pending  Release of information consent forms completed and in the chart;    Patient to Follow up at: Follow-up [facility]. Follow up on 08/30/2019.   Why: You have been accepted for ACT services, they will follow up with you at the group home. You have a psychiatry appointment scheduled for Monday 08/30/19 at 10:30am. Thank You! Contact information: [place] [address] [phone]           Next level of care provider has access to [place] and Suicide Prevention discussed: Yes,  SPE completed with pt as pt declined collateral contact  Have you used any form of tobacco in the last 30 days? (Cigarettes, Smokeless Tobacco, Cigars, and/or Pipes): Yes  Has patient been referred to the Quitline?: Patient refused referral  Patient has been referred for addiction treatment: N/A  Delfin Edis, LCSW 08/25/2019, 2:00 PM

## 2019-08-25 NOTE — Progress Notes (Signed)
Supervisor received email from [employer] and spoke to her by phone.  [name] had several additional questions about how the LOG payment process worked and Librarian, academic resent her the letter with instructions.  [name] said she was all set and would pick up the patient by 5pm today. [name], MSW, LCSW Advanced Care Supervisor 08/25/2019 2:54 PM

## 2019-08-25 NOTE — Plan of Care (Signed)
  Problem: Group Participation Goal: STG - Patient will engage in groups without prompting or encouragement from LRT x3 group sessions within 5 recreation therapy group sessions Description: STG - Patient will engage in groups without prompting or encouragement from LRT x3 group sessions within 5 recreation therapy group sessions 08/25/2019 1615 by Ernest Haber, LRT Outcome: Adequate for Discharge 08/25/2019 1615 by Ernest Haber, LRT Outcome: Adequate for Discharge

## 2019-08-25 NOTE — Progress Notes (Signed)
Recreation Therapy Notes   Date: 08/25/2019  Time: 9:30 am   Location: Craft room   Behavioral response: N/A   Intervention Topic: Relaxation   Discussion/Intervention: Patient did not attend group.   Clinical Observations/Feedback:  Patient did not attend group.   Catrina Fellenz LRT/CTRS        Halley Kincer 08/25/2019 11:18 AM

## 2019-08-25 NOTE — Progress Notes (Signed)
D: Patient is aware of  Discharge this shift .  A:Patient denies suicidal /homicidal ideations. Patient received all belongings brought in  No Storage medications. Writer reviewed Discharge Summary, Suicide Risk Assessment, and Transitional Record. Patient also received Prescriptions   from  MD. A 7 day supply of medications given to patient  Aware  Of follow up appointment .  R: Patient left unit with no questions  Or concerns  With group home staff., Spero Geralds

## 2019-08-25 NOTE — BHH Suicide Risk Assessment (Signed)
[facility] Discharge Suicide Risk Assessment   Principal Problem: Psychosis [facility]) Discharge Diagnoses: Principal Problem:   Psychosis ([place]) Active Problems:   Polysubstance abuse ([place])   Depressed mood   Substance induced mood disorder ([place])   Schizophrenia, paranoid ([place])   Total Time spent with patient: 30 minutes  Musculoskeletal: Strength & Muscle Tone: within normal limits Gait & Station: normal Patient leans: N/A  Psychiatric Specialty Exam: Review of Systems  Constitutional: Negative.   HENT: Negative.   Eyes: Negative.   Respiratory: Negative.   Cardiovascular: Negative.   Gastrointestinal: Negative.   Musculoskeletal: Negative.   Skin: Negative.   Neurological: Negative.   Psychiatric/Behavioral: Negative.     Blood pressure 98/63, pulse (!) 107, temperature 98.9 F (37.2 C), temperature source Oral, resp. rate 18, height 5\' 4"  (1.626 m), weight 67.2 kg, SpO2 98 %.Body mass index is 25.43 kg/m.  General Appearance: Casual  Eye Contact::  Good  Speech:  Clear and Coherent409  Volume:  Normal  Mood:  Euthymic  Affect:  Constricted  Thought Process:  Coherent  Orientation:  Full (Time, Place, and Person)  Thought Content:  Logical  Suicidal Thoughts:  No  Homicidal Thoughts:  No  Memory:  Immediate;   Fair Recent;   Fair Remote;   Fair  Judgement:  Fair  Insight:  Fair  Psychomotor Activity:  Normal  Concentration:  Fair  Recall:  [employer] [facility] [place]  Language: Fair  Akathisia:  No  Handed:  Right  AIMS (if indicated):     Assets:  Desire for Improvement Housing Physical Health Resilience Social Support  Sleep:  Number of Hours: 7.5  Cognition: Impaired,  Mild  ADL's:  Impaired   Mental Status Per Nursing Assessment::   On Admission:  NA  Demographic Factors:  Adolescent or young adult, Caucasian and Low socioeconomic status  Loss Factors: Decline in physical health and Financial problems/change in socioeconomic status  Historical  Factors: Impulsivity  Risk Reduction Factors:   Religious beliefs about death, Living with another person, especially a relative, Positive social support and Positive therapeutic relationship  Continued Clinical Symptoms:  Alcohol/Substance Abuse/Dependencies Schizophrenia:   Less than 33 years old Paranoid or undifferentiated type  Cognitive Features That Contribute To Risk:  Thought constriction (tunnel vision)    Suicide Risk:  Minimal: No identifiable suicidal ideation.  Patients presenting with no risk factors but with morbid ruminations; may be classified as minimal risk based on the severity of the depressive symptoms  Follow-up Information    Services, Psychotherapeutic Follow up.   Why: accepted for cst Contact information: [address] [place] [address] [place] Follow up.   Contact information: [address] [place] [address] [phone]        [facility] Follow up.   Why: accepted for act Contact information: [place] [address] [phone]        [facility] Follow up on 08/18/2019.   Why: You have an appointment scheduled for 08/18/19 at 9:30AM. Thank You! Contact information: [place] [address] [facility] Follow up.   Specialty: Behavioral Health Why: referral sent 2/2 Contact information: [place]. [place] [phone]          Plan Of Care/Follow-up recommendations:  Activity:  as  tolerated Diet:  regular Other:  follow up with outpaitne tmental health care.  Alethia Berthold, MD 08/25/2019, 1:13 PM

## 2019-08-25 NOTE — Progress Notes (Signed)
   08/25/19 1500  Clinical Encounter Type  Visited With Patient  Visit Type Follow-up;Spiritual support;Social support;Behavioral Health  Referral From Chaplain  Consult/Referral To [place] spoke briefly with patient. Patient is preparing for discharge. Chaplain told patient how happy she is about her leaving. Chaplain also commented on patient's smile. Patient is extremely happy. Chaplain offered pastoral presence and empathy.

## 2019-08-25 NOTE — Progress Notes (Signed)
Patient alert and oriented x 4, affect is flat and sad, she was noted quiet  no bizarre bizarre on unit, she denies SI/HI/AVH. She was noted isolating with peers and staff, compliant with medication regimen, emotional support and encouragement offered she was receptive, 15 minutes safety checks maintained will continue to monitor.

## 2019-10-13 ENCOUNTER — Telehealth (HOSPITAL_COMMUNITY): Payer: Self-pay | Admitting: *Deleted

## 2019-10-13 NOTE — Telephone Encounter (Signed)
Fax received for prior auth of Paliperidone ER. Called [place] tracks spoke with Debbie who gave approval until 10/07/20 Josem Kaufmann [id number].

## 2019-10-15 ENCOUNTER — Other Ambulatory Visit: Payer: Self-pay

## 2019-10-15 ENCOUNTER — Encounter (HOSPITAL_COMMUNITY): Payer: Self-pay | Admitting: Psychiatry

## 2019-10-15 ENCOUNTER — Inpatient Hospital Stay (HOSPITAL_COMMUNITY)
Admission: AD | Admit: 2019-10-15 | Discharge: 2019-11-26 | DRG: 885 | Disposition: A | Discharge status: Home / Self Care | Payer: Medicaid Other | Attending: Psychiatry | Admitting: Psychiatry

## 2019-10-15 DIAGNOSIS — F1521 Other stimulant dependence, in remission: Secondary | ICD-10-CM | POA: Diagnosis present

## 2019-10-15 DIAGNOSIS — E221 Hyperprolactinemia: Secondary | ICD-10-CM | POA: Diagnosis not present

## 2019-10-15 DIAGNOSIS — Z818 Family history of other mental and behavioral disorders: Secondary | ICD-10-CM

## 2019-10-15 DIAGNOSIS — R45851 Suicidal ideations: Secondary | ICD-10-CM | POA: Diagnosis present

## 2019-10-15 DIAGNOSIS — Z975 Presence of (intrauterine) contraceptive device: Secondary | ICD-10-CM

## 2019-10-15 DIAGNOSIS — I959 Hypotension, unspecified: Secondary | ICD-10-CM | POA: Diagnosis not present

## 2019-10-15 DIAGNOSIS — Y92230 Patient room in hospital as the place of occurrence of the external cause: Secondary | ICD-10-CM | POA: Diagnosis not present

## 2019-10-15 DIAGNOSIS — F332 Major depressive disorder, recurrent severe without psychotic features: Secondary | ICD-10-CM

## 2019-10-15 DIAGNOSIS — Z888 Allergy status to other drugs, medicaments and biological substances status: Secondary | ICD-10-CM

## 2019-10-15 DIAGNOSIS — T43505A Adverse effect of unspecified antipsychotics and neuroleptics, initial encounter: Secondary | ICD-10-CM | POA: Diagnosis not present

## 2019-10-15 DIAGNOSIS — F315 Bipolar disorder, current episode depressed, severe, with psychotic features: Secondary | ICD-10-CM | POA: Diagnosis present

## 2019-10-15 DIAGNOSIS — Z79899 Other long term (current) drug therapy: Secondary | ICD-10-CM

## 2019-10-15 DIAGNOSIS — Z560 Unemployment, unspecified: Secondary | ICD-10-CM

## 2019-10-15 DIAGNOSIS — F4010 Social phobia, unspecified: Secondary | ICD-10-CM | POA: Diagnosis present

## 2019-10-15 DIAGNOSIS — F17210 Nicotine dependence, cigarettes, uncomplicated: Secondary | ICD-10-CM | POA: Diagnosis present

## 2019-10-15 DIAGNOSIS — Z915 Personal history of self-harm: Secondary | ICD-10-CM

## 2019-10-15 DIAGNOSIS — R4589 Other symptoms and signs involving emotional state: Secondary | ICD-10-CM | POA: Diagnosis present

## 2019-10-15 DIAGNOSIS — N390 Urinary tract infection, site not specified: Secondary | ICD-10-CM | POA: Diagnosis present

## 2019-10-15 DIAGNOSIS — F329 Major depressive disorder, single episode, unspecified: Secondary | ICD-10-CM | POA: Diagnosis present

## 2019-10-15 DIAGNOSIS — F333 Major depressive disorder, recurrent, severe with psychotic symptoms: Secondary | ICD-10-CM | POA: Diagnosis present

## 2019-10-15 DIAGNOSIS — F29 Unspecified psychosis not due to a substance or known physiological condition: Secondary | ICD-10-CM | POA: Diagnosis present

## 2019-10-15 DIAGNOSIS — Z9119 Patient's noncompliance with other medical treatment and regimen: Secondary | ICD-10-CM

## 2019-10-15 DIAGNOSIS — Z590 Homelessness: Secondary | ICD-10-CM

## 2019-10-15 DIAGNOSIS — Z20822 Contact with and (suspected) exposure to covid-19: Secondary | ICD-10-CM | POA: Diagnosis present

## 2019-10-15 DIAGNOSIS — F200 Paranoid schizophrenia: Principal | ICD-10-CM | POA: Diagnosis present

## 2019-10-15 LAB — RESPIRATORY PANEL BY RT PCR (FLU A&B, COVID)
Influenza A by PCR: NEGATIVE
Influenza B by PCR: NEGATIVE
SARS Coronavirus 2 by RT PCR: NEGATIVE

## 2019-10-15 MED ORDER — HYDROXYZINE HCL 25 MG PO TABS
25.0000 mg | ORAL_TABLET | Freq: Three times a day (TID) | ORAL | Status: DC | PRN
  Filled 2019-10-15: qty 1

## 2019-10-15 MED ORDER — MAGNESIUM HYDROXIDE 400 MG/5ML PO SUSP
30.0000 mL | Freq: Every day | ORAL | Status: DC | PRN
  Filled 2019-10-15: qty 30

## 2019-10-15 MED ORDER — ACETAMINOPHEN 325 MG PO TABS
650.0000 mg | ORAL_TABLET | Freq: Four times a day (QID) | ORAL | Status: DC | PRN
  Filled 2019-10-15 (×2): qty 2

## 2019-10-15 MED ORDER — TRAZODONE HCL 50 MG PO TABS
50.0000 mg | ORAL_TABLET | Freq: Every evening | ORAL | Status: DC | PRN
  Filled 2019-10-15: qty 1

## 2019-10-15 MED ORDER — ALUM & MAG HYDROXIDE-SIMETH 200-200-20 MG/5ML PO SUSP
30.0000 mL | ORAL | Status: DC | PRN
  Administered 2019-11-22 (×2): 30 mL via ORAL
  Filled 2019-10-15: qty 30
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
## 2019-10-15 NOTE — H&P (Signed)
[facility]ation [facility] Provider Admission PAA/H&P  Patient Identification: Julia [name] MRN:  [id number] Date of Evaluation:  10/15/2019 Chief Complaint:  Psych Eval Principal Diagnosis: Depressed mood Diagnosis:  Principal Problem:   Depressed mood Active Problems:   MDD (major depressive disorder), recurrent, severe, with psychosis ([place])  History of Present Illness: Patient presents voluntarily to [facility] behavioral health for walk-in assessment.  Patient reports depressed mood.  Patient endorses suicidal ideations x1 day, denies plan denies intent.  Patient denies homicidal ideations.  Patient denies auditory and visual hallucinations.  Patient reports sleep is "good approximately 10 hours" and no change in appetite. Patient tearful during assessment reports she was asked to leave [facility] group home/substance use treatment facility after arguing with staff members and peers.  Per father, group home facilitator Inez Catalina reports that patient can return.  Betty phone number [phone]. Patient tearful reports "I do not have anywhere to go."  Patient unable to return to father's home as he is "staying with a friend."  Patient denies access to weapons.  Patient denies alcohol and substance use.  Patient reports history of substance use however states "I do not want to talk about it." Patient gives verbal consent to speak with her father, Rush Landmark phone number [phone]. Patient reports she is followed by [facility] act team, patient unable to recall name of act team contact.  Patient reports home medications currently include Prozac, Cogentin, gabapentin and trazodone. Patient's father states "she just needs help finding a good group home."  Patient's father reports he has discouraged friends and family from providing alcohol or substance for patient.  Patient's father reports patient has history of alcohol use disorder. 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 No.  Has the patient been a risk to self in the past 6 months? Yes.    Has the patient been a risk to self within the distant past? Yes.    Is the patient a risk to others? No.  Has the patient been a risk to others in the past 6 months? No.  Has the patient been a risk to others within the distant past? No.   Prior Inpatient Therapy:   Prior Outpatient Therapy:    Alcohol Screening:   Substance Abuse History in the last 12 months:  Yes.   Consequences of Substance Abuse: NA Previous Psychotropic Medications: Yes  Psychological Evaluations: Yes  Past Medical History:  Past Medical History:  Diagnosis Date  . Burning with urination 05/03/2015  . Contraceptive management 05/03/2015  . Depression   . Dysmenorrhea 12/30/2013  . Heroin addiction ([place])   . Menorrhagia 12/30/2013  . Menstrual extraction 12/30/2013  . Migraines   . Social anxiety disorder 09/25/2016  . Suicidal ideations   . Vaginal odor 05/03/2015    Past Surgical History:  Procedure Laterality Date  . NO PAST SURGERIES     Family History:  Family History  Problem Relation Age of Onset  . Depression Mother   . Hypertension Father   . Hyperlipidemia Father   . Cancer Paternal Grandmother        breast, uterine  . Cirrhosis Paternal Grandfather  due to alcohol   Family Psychiatric History: Maternal grandmother completed suicide, maternal great-grandmother expired while admitted to mental institution Tobacco Screening:   Social History:  Social History   Substance  and Sexual Activity  Alcohol Use Yes   Comment: BAC was clear     Social History   Substance and Sexual Activity  Drug Use Yes  . Types: Marijuana, Oxycodone   Comment: reports oxycodone and marijuana    Additional Social History:      Pain Medications: Please see MAR Prescriptions: Please see MAR, prozac and cogentin per pt Over the Counter: Please see MAR History of alcohol / drug use?: Yes Longest period of sobriety (when/how long): Unknown Negative Consequences of Use: Legal Name of Substance 1: alcohol 1 - Frequency: UTA 1 - Duration: UTA- pt denies 1 - Last Use / Amount: UTA                  Allergies:   Allergies  Allergen Reactions  . Abilify [Aripiprazole]   . Zoloft [Sertraline Hcl]    Lab Results: No results found for this or any previous visit (from the past 48 hour(s)).  Blood Alcohol level:  Lab Results  Component Value Date   ETH <10 07/09/2019   ETH <10 [id number]    Metabolic Disorder Labs:  Lab Results  Component Value Date   HGBA1C 5.2 05/17/2019   MPG 102.54 05/17/2019   MPG 108.28 02/17/2019   Lab Results  Component Value Date   PROLACTIN 31.9 (H) 02/17/2019   PROLACTIN 29.6 (H) 09/25/2016   Lab Results  Component Value Date   CHOL 144 05/17/2019   TRIG 65 05/17/2019   HDL 35 (L) 05/17/2019   CHOLHDL 4.1 05/17/2019   VLDL 13 05/17/2019   LDLCALC 96 05/17/2019   LDLCALC 76 02/17/2019    Current Medications: Current Outpatient Medications  Medication Sig Dispense Refill  . benztropine (COGENTIN) 1 MG tablet Take 1 tablet (1 mg total) by mouth 2 (two) times daily. 60 tablet 1  . FLUoxetine (PROZAC) 20 MG capsule Take 1 capsule (20 mg total) by mouth daily. 30 capsule 1  . gabapentin (NEURONTIN) 400 MG capsule Take 1 capsule (400 mg total) by mouth 3 (three) times daily. 90 capsule 1  . haloperidol decanoate (HALDOL DECANOATE) 100 MG/ML injection Inject 0.5 mLs (50 mg total) into the muscle every 30 (thirty) days. NEXT DOSE  DUE AROUND September 15, 2019 1 mL 1  . paliperidone (INVEGA SUSTENNA) 156 MG/ML SUSY injection Inject 1 mL (156 mg total) into the muscle every 30 (thirty) days. NEXT DOSE DUE AROUND September 15, 2019 1.2 mL 1  . paliperidone (INVEGA) 3 MG 24 hr tablet Take 1 tablet (3 mg total) by mouth daily. 30 tablet 1  . traZODone (DESYREL) 50 MG tablet Take 1 tablet (50 mg total) by mouth at bedtime. 30 tablet 1   No current facility-administered medications for this encounter.   PTA Medications: (Not in a hospital admission)   Musculoskeletal: Strength & Muscle Tone: within normal limits Gait & Station: normal Patient leans: N/A  Psychiatric Specialty Exam: Physical Exam  Nursing note and vitals reviewed. Constitutional: She is oriented to person, place, and time. She appears well-developed.  HENT:  Head: Normocephalic.  Cardiovascular: Normal rate.  Respiratory: Effort normal.  Neurological: She is alert and oriented to person, place, and time.  Psychiatric: Her speech is normal and behavior is normal. Judgment normal. Cognition and memory are normal. She exhibits a depressed mood.  She expresses suicidal ideation.    Review of Systems  Constitutional: Negative.   HENT: Negative.   Eyes: Negative.   Respiratory: Negative.   Cardiovascular: Negative.   Gastrointestinal: Negative.   Genitourinary: Negative.   Musculoskeletal: Negative.   Skin: Negative.   Neurological: Negative.   Psychiatric/Behavioral: Positive for suicidal ideas.    There were no vitals taken for this visit.There is no height or weight on file to calculate BMI.  General Appearance: Casual  Eye Contact:  Good  Speech:  Clear and Coherent and Normal Rate  Volume:  Normal  Mood:  Depressed  Affect:  Tearful  Thought Process:  Coherent, Goal Directed and Descriptions of Associations: Intact  Orientation:  Full (Time, Place, and Person)  Thought Content:  Logical  Suicidal Thoughts:  Yes.  without intent/plan  Homicidal  Thoughts:  No  Memory:  Immediate;   Good Recent;   Good Remote;   Good  Judgement:  Fair  Insight:  Fair  Psychomotor Activity:  Normal  Concentration:  Concentration: Good and Attention Span: Good  Recall:  Good  Fund of Knowledge:  Good  Language:  Good  Akathisia:  No  Handed:  Right  AIMS (if indicated):     Assets:  Communication Skills Desire for Improvement Financial Resources/Insurance Housing Intimacy Leisure Time Physical Health Resilience Social Support  ADL's:  Intact  Cognition:  WNL  Sleep:         Treatment Plan Summary: Plan Overnight observation for safety and stability.  Observation Level/Precautions:  15 minute checks Laboratory:  CBC Chemistry Profile Folic Acid GGT HbAIC HCG UDS UA Psychotherapy:   Medications:  See MAR Consultations:  PRN Discharge Concerns:  Homeless? Estimated LOS: Other:      Emmaline Kluver, FNP 4/2/20215:58 PM

## 2019-10-15 NOTE — Plan of Care (Signed)
[facility] Observation Crisis Plan  Reason for Crisis Plan:  Crisis Stabilization   Plan of Care:  Referral for Inpatient Hospitalization  Family Support:      Current Living Environment:  Living Arrangements: Alone  Insurance:   Hospital Account    Name Acct ID Class Status Primary Coverage   Emmett, [place] [id number] [place]        Guarantor Account (for Hospital Account [id number])    Name Relation to Pt Service Area Active? Acct Type   [name] Self [facility] Yes Behavioral Health   Address Phone       [address] [place], [place] [address] [phone](H)          Coverage Information (for Hospital Account [id number])    F/O Payor/Plan Precert #   [employer]ICA[employer]    Subscriber Subscriber #   [place], [place] [id number] K   Address Phone   [address] [place], [place] [address] [phone]      Legal Guardian:     Primary Care Provider:  [facility]  Current Outpatient Providers:  [facility]  Psychiatrist:  Name of Psychiatrist: pt denies, but has [name]  Counselor/Therapist:  Name of Therapist: pt denies  Compliant with Medications:  Yes  Additional Information:   Jesusita Oka 4/2/202110:51 PM

## 2019-10-15 NOTE — Progress Notes (Signed)
Patient ID: [name], female   DOB: May 18, 2001, 19 y.o.   MRN: [id number] Pt A&O x 4, presents with SI, no plan noted, pt very guarded, irritable, stating she is depressed and doesn't feel well. Denies HI or AVH.  Skin search completed, monitoring for safety, no distress noted, cooperative at present.  Comfort measures given, Gatorade offered, pt tolerated well.

## 2019-10-16 ENCOUNTER — Encounter (HOSPITAL_COMMUNITY): Payer: Self-pay | Admitting: Psychiatry

## 2019-10-16 DIAGNOSIS — Z818 Family history of other mental and behavioral disorders: Secondary | ICD-10-CM | POA: Diagnosis not present

## 2019-10-16 DIAGNOSIS — R45851 Suicidal ideations: Secondary | ICD-10-CM | POA: Diagnosis present

## 2019-10-16 DIAGNOSIS — F333 Major depressive disorder, recurrent, severe with psychotic symptoms: Secondary | ICD-10-CM | POA: Diagnosis not present

## 2019-10-16 DIAGNOSIS — N390 Urinary tract infection, site not specified: Secondary | ICD-10-CM | POA: Diagnosis present

## 2019-10-16 DIAGNOSIS — Z79899 Other long term (current) drug therapy: Secondary | ICD-10-CM | POA: Diagnosis not present

## 2019-10-16 DIAGNOSIS — Z560 Unemployment, unspecified: Secondary | ICD-10-CM | POA: Diagnosis not present

## 2019-10-16 DIAGNOSIS — Z590 Homelessness: Secondary | ICD-10-CM | POA: Diagnosis not present

## 2019-10-16 DIAGNOSIS — Z915 Personal history of self-harm: Secondary | ICD-10-CM | POA: Diagnosis not present

## 2019-10-16 DIAGNOSIS — F17210 Nicotine dependence, cigarettes, uncomplicated: Secondary | ICD-10-CM | POA: Diagnosis present

## 2019-10-16 DIAGNOSIS — F329 Major depressive disorder, single episode, unspecified: Secondary | ICD-10-CM | POA: Diagnosis present

## 2019-10-16 DIAGNOSIS — E221 Hyperprolactinemia: Secondary | ICD-10-CM | POA: Diagnosis not present

## 2019-10-16 DIAGNOSIS — F1521 Other stimulant dependence, in remission: Secondary | ICD-10-CM | POA: Diagnosis present

## 2019-10-16 DIAGNOSIS — R4589 Other symptoms and signs involving emotional state: Secondary | ICD-10-CM | POA: Diagnosis not present

## 2019-10-16 DIAGNOSIS — Z20822 Contact with and (suspected) exposure to covid-19: Secondary | ICD-10-CM | POA: Diagnosis present

## 2019-10-16 DIAGNOSIS — I959 Hypotension, unspecified: Secondary | ICD-10-CM | POA: Diagnosis not present

## 2019-10-16 DIAGNOSIS — Y92230 Patient room in hospital as the place of occurrence of the external cause: Secondary | ICD-10-CM | POA: Diagnosis not present

## 2019-10-16 DIAGNOSIS — F315 Bipolar disorder, current episode depressed, severe, with psychotic features: Secondary | ICD-10-CM | POA: Diagnosis present

## 2019-10-16 DIAGNOSIS — F4010 Social phobia, unspecified: Secondary | ICD-10-CM | POA: Diagnosis present

## 2019-10-16 DIAGNOSIS — F200 Paranoid schizophrenia: Secondary | ICD-10-CM | POA: Diagnosis not present

## 2019-10-16 DIAGNOSIS — Z975 Presence of (intrauterine) contraceptive device: Secondary | ICD-10-CM | POA: Diagnosis not present

## 2019-10-16 DIAGNOSIS — Z888 Allergy status to other drugs, medicaments and biological substances status: Secondary | ICD-10-CM | POA: Diagnosis not present

## 2019-10-16 DIAGNOSIS — T43505A Adverse effect of unspecified antipsychotics and neuroleptics, initial encounter: Secondary | ICD-10-CM | POA: Diagnosis not present

## 2019-10-16 DIAGNOSIS — Z9119 Patient's noncompliance with other medical treatment and regimen: Secondary | ICD-10-CM | POA: Diagnosis not present

## 2019-10-16 LAB — CBC
HCT: 42.6 % (ref 36.0–46.0)
Hemoglobin: 13.4 g/dL (ref 12.0–15.0)
MCH: 28.5 pg (ref 26.0–34.0)
MCHC: 31.5 g/dL (ref 30.0–36.0)
MCV: 90.6 fL (ref 80.0–100.0)
Platelets: 230 10*3/uL (ref 150–400)
RBC: 4.7 MIL/uL (ref 3.87–5.11)
RDW: 13.2 % (ref 11.5–15.5)
WBC: 6.5 10*3/uL (ref 4.0–10.5)
nRBC: 0 % (ref 0.0–0.2)

## 2019-10-16 LAB — ETHANOL: Alcohol, Ethyl (B): <10 mg/dL (ref <10)

## 2019-10-16 LAB — URINALYSIS, ROUTINE W REFLEX MICROSCOPIC
Bilirubin Urine: NEGATIVE
Glucose, UA: NEGATIVE mg/dL
Ketones, ur: 5 mg/dL — AB
Nitrite: NEGATIVE
Protein, ur: NEGATIVE mg/dL
Specific Gravity, Urine: 1.01 (ref 1.005–1.030)
WBC, UA: >50 WBC/hpf — ABNORMAL HIGH (ref 0–5)
pH: 6 (ref 5.0–8.0)

## 2019-10-16 LAB — COMPREHENSIVE METABOLIC PANEL
ALT: 16 U/L (ref 0–44)
AST: 23 U/L (ref 15–41)
Albumin: 4.4 g/dL (ref 3.5–5.0)
Alkaline Phosphatase: 67 U/L (ref 38–126)
Anion gap: 9 (ref 5–15)
BUN: 10 mg/dL (ref 6–20)
CO2: 25 mmol/L (ref 22–32)
Calcium: 9.5 mg/dL (ref 8.9–10.3)
Chloride: 106 mmol/L (ref 98–111)
Creatinine, Ser: 0.9 mg/dL (ref 0.44–1.00)
GFR calc Af Amer: >60 mL/min (ref >60)
GFR calc non Af Amer: >60 mL/min (ref >60)
Glucose, Bld: 104 mg/dL — ABNORMAL HIGH (ref 70–99)
Potassium: 4.1 mmol/L (ref 3.5–5.1)
Sodium: 140 mmol/L (ref 135–145)
Total Bilirubin: 0.5 mg/dL (ref 0.3–1.2)
Total Protein: 7.6 g/dL (ref 6.5–8.1)

## 2019-10-16 LAB — LIPID PANEL
Cholesterol: 138 mg/dL (ref 0–200)
HDL: 36 mg/dL — ABNORMAL LOW (ref >40)
LDL Cholesterol: 90 mg/dL (ref 0–99)
Total CHOL/HDL Ratio: 3.8 RATIO
Triglycerides: 60 mg/dL (ref <150)
VLDL: 12 mg/dL (ref 0–40)

## 2019-10-16 LAB — HEMOGLOBIN A1C
Hgb A1c MFr Bld: 5.4 % (ref 4.8–5.6)
Mean Plasma Glucose: 108.28 mg/dL

## 2019-10-16 LAB — HEPATIC FUNCTION PANEL
ALT: 16 U/L (ref 0–44)
AST: 23 U/L (ref 15–41)
Albumin: 4.4 g/dL (ref 3.5–5.0)
Alkaline Phosphatase: 67 U/L (ref 38–126)
Bilirubin, Direct: 0.1 mg/dL (ref 0.0–0.2)
Indirect Bilirubin: 0.4 mg/dL (ref 0.3–0.9)
Total Bilirubin: 0.5 mg/dL (ref 0.3–1.2)
Total Protein: 7.6 g/dL (ref 6.5–8.1)

## 2019-10-16 LAB — MAGNESIUM: Magnesium: 2 mg/dL (ref 1.7–2.4)

## 2019-10-16 LAB — PREGNANCY, URINE: Preg Test, Ur: NEGATIVE

## 2019-10-16 MED ORDER — FLUOXETINE HCL 20 MG PO CAPS
40.0000 mg | ORAL_CAPSULE | Freq: Every day | ORAL | Status: DC
  Administered 2019-10-17 – 2019-11-17 (×32): 40 mg via ORAL
  Filled 2019-10-16 (×36): qty 2

## 2019-10-16 MED ORDER — HALOPERIDOL LACTATE 5 MG/ML IJ SOLN
5.0000 mg | Freq: Four times a day (QID) | INTRAMUSCULAR | Status: DC | PRN
  Filled 2019-10-16: qty 1

## 2019-10-16 MED ORDER — HALOPERIDOL 5 MG PO TABS
5.0000 mg | ORAL_TABLET | Freq: Four times a day (QID) | ORAL | Status: DC | PRN
  Administered 2019-10-25 – 2019-11-21 (×17): 5 mg via ORAL
  Filled 2019-10-16 (×18): qty 1

## 2019-10-16 MED ORDER — BENZTROPINE MESYLATE 1 MG PO TABS
1.0000 mg | ORAL_TABLET | Freq: Two times a day (BID) | ORAL | Status: DC | PRN
  Administered 2019-10-27 – 2019-11-25 (×15): 1 mg via ORAL
  Filled 2019-10-16: qty 28
  Filled 2019-10-16 (×10): qty 1
  Filled 2019-10-16: qty 42
  Filled 2019-10-16 (×6): qty 1

## 2019-10-16 MED ORDER — GABAPENTIN 400 MG PO CAPS
400.0000 mg | ORAL_CAPSULE | Freq: Three times a day (TID) | ORAL | Status: DC
  Administered 2019-10-16: 400 mg via ORAL
  Filled 2019-10-16 (×4): qty 1

## 2019-10-16 MED ORDER — LORAZEPAM 1 MG PO TABS
2.0000 mg | ORAL_TABLET | Freq: Four times a day (QID) | ORAL | Status: DC | PRN
  Administered 2019-10-25 – 2019-11-05 (×7): 2 mg via ORAL
  Filled 2019-10-16 (×9): qty 2

## 2019-10-16 MED ORDER — PALIPERIDONE ER 6 MG PO TB24
6.0000 mg | ORAL_TABLET | Freq: Every day | ORAL | Status: DC
  Administered 2019-10-16 – 2019-10-19 (×4): 6 mg via ORAL
  Filled 2019-10-16 (×6): qty 1

## 2019-10-16 MED ORDER — FLUOXETINE HCL 20 MG PO CAPS
20.0000 mg | ORAL_CAPSULE | Freq: Every day | ORAL | Status: DC
  Administered 2019-10-16: 20 mg via ORAL
  Filled 2019-10-16 (×2): qty 1

## 2019-10-16 MED ORDER — LORAZEPAM 2 MG/ML IJ SOLN
2.0000 mg | Freq: Four times a day (QID) | INTRAMUSCULAR | Status: DC | PRN
  Administered 2019-10-31: 2 mg via INTRAMUSCULAR
  Filled 2019-10-16: qty 1

## 2019-10-16 MED ORDER — GABAPENTIN 100 MG PO CAPS
100.0000 mg | ORAL_CAPSULE | Freq: Three times a day (TID) | ORAL | Status: DC
  Administered 2019-10-16 – 2019-11-05 (×59): 100 mg via ORAL
  Filled 2019-10-16 (×69): qty 1
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
## 2019-10-16 NOTE — Progress Notes (Signed)
Attempted to call [facility] for medication verification for patient but no response X 3.

## 2019-10-16 NOTE — Plan of Care (Deleted)
[facility] Observation Crisis Plan  Reason for Crisis Plan:  Crisis Stabilization   Plan of Care:  Referral for Telepsychiatry/Psychiatric Consult  Family Support:      Current Living Environment:  Living Arrangements: Group Home(states can't return to previous group home)  Insurance:   Hospital Account    Name Acct ID Class Status Primary Coverage   Julia [name], Julia [name] [id number] [place]        Guarantor Account (for Hospital Account [id number])    Name Relation to Pt Service Area Active? Acct Type   Julia [name] Self [facility] Yes Behavioral Health   Address Phone       [address] [place], [place] [address] [phone](H)          Coverage Information (for Hospital Account [id number])    F/O Payor/Plan Precert #   [employer]ICA[employer]    Subscriber Subscriber #   Julia [name], Julia [name] [id number] K   Address Phone   [address] [place],  [address] [phone]      Legal Guardian:     [facility] Care Provider:  [name]  Current Outpatient Providers:  [facility] and Julia [name] [facility]  Psychiatrist:  Name of Psychiatrist: pt denies, but has [name]  Counselor/Therapist:  Name of Therapist: pt denies  Compliant with Medications:  Yes  Additional Information: Pt doesn't believe she can return to previous group home so she is essentially homeless at this time.   Julia [name] 4/3/202112:04 AM

## 2019-10-16 NOTE — Progress Notes (Signed)
Pt states that she believes that she has a UTI. Provider notified for orders to further test urine besides the order for urine pregnancy screen already ordered. Pt states that she has tried but cannot provider a urine sample at this time. Will continue to monitor.

## 2019-10-16 NOTE — Progress Notes (Signed)
Patient ID: Julia [name], female   DOB: June 19, 2001, 19 y.o.   MRN: [id number] Pt moved to 400-1 for overnight observation and psychiatric evaluation in the morning. Pt A & O x 4. Pt denies current suicidal ideation. Pt denies pain. Q15 min checks started for safety. Pt maintained safely on the unit.
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
## 2019-10-16 NOTE — H&P (Signed)
Psychiatric Admission Assessment Adult  Patient Identification: Julia [name] MRN:  [id number] Date of Evaluation:  10/16/2019 Chief Complaint:  MDD (major depressive disorder) [F32.9] Principal Diagnosis: Depressed mood Diagnosis:  Principal Problem:   Depressed mood Active Problems:   MDD (major depressive disorder), recurrent, severe, with psychosis ([place])   MDD (major depressive disorder)  History of Present Illness: 19 year old female, known to [facility] from prior admissions, presented to [facility] voluntarily reporting suicidal ideations, worsening depression over the last month or so, neuro-vegetative symptoms". She also presents with psychotic symptoms as noted below. She had been staying in a [facility] over the last month or so, but was kicked out 2- days ago due to an altercation with other clients and she is now homeless . She reports history of intermittent suicidal ideations , recently worsening . Describes these as passive. She reports she feels she is " being tortured " by a man . States she has never actually met this person- " I don't know who he is but he is a pedophile , he puts thoughts in my head , harasses me sexually"  She states "it is hard to explain , he thinks about those things and about me too at the same time". She states she sometimes hears his voice , saying " you are Daddy's little girl". Associated Signs/Symptoms: Depression Symptoms:  depressed mood, anhedonia, suicidal thoughts without plan, (Hypo) Manic Symptoms: reports irritability Anxiety Symptoms: does not endorse  Psychotic Symptoms:delusions as above  PTSD Symptoms: denies   Total Time spent with patient: 45 minutes  Past Psychiatric History: reports history of several prior psychiatric admissions, initially at age 71. She reports she has been diagnosed with Schizoaffective Disorder in the past . She was most recently admitted at [facility] in December 2020, at which time presented for depression, suicidal  ideations, psychotic symptoms. She was diagnosed with MDD and polysubstance abuse . She was discharged on Prozac, Seroquel.  Reports history of a suicide attempt by overdosing at age 35. Denies history of self cutting or self injurious behaviors  Is the patient at risk to self? Yes.    Has the patient been a risk to self in the past 6 months? Yes.    Has the patient been a risk to self within the distant past? Yes.    Is the patient a risk to others? No.  Has the patient been a risk to others in the past 6 months? No.  Has the patient been a risk to others within the distant past? No.   Prior Inpatient Therapy: Prior Inpatient Therapy: Yes Prior Therapy Dates: 08/2019 Prior Therapy Facilty/Provider(s): [facility] Reason for Treatment: Depression Prior Outpatient Therapy: Prior Outpatient Therapy: ([facility])  Alcohol Screening: Patient refused Alcohol Screening Tool: Yes 1. How often do you have a drink containing alcohol?: Never 2. How many drinks containing alcohol do you have on a typical day when you are drinking?: 1 or 2 3. How often do you have six or more drinks on one occasion?: Never AUDIT-C Score: 0 4. How often during the last year have you found that you were not able to stop drinking once you had started?: Never 5. How often during the last year have you failed to do what was normally expected from you becasue of drinking?: Never 6. How often during the last year have you needed a first drink in the morning to get yourself going after a heavy drinking session?: Never 7. How often during the last year have  you had a feeling of guilt of remorse after drinking?: Never 8. How often during the last year have you been unable to remember what happened the night before because you had been drinking?: Never 9. Have you or someone else been injured as a result of your drinking?: No 10. Has a relative or friend or a doctor or another health worker been concerned about your drinking or suggested  you cut down?: No Alcohol Use Disorder Identification Test Final Score (AUDIT): 0 Alcohol Brief Interventions/Follow-up: AUDIT Score <7 follow-up not indicated Substance Abuse History in the last 12 months: denies alcohol abuse, she denies recent drug abuse .  Consequences of Substance Abuse: Denies  Previous Psychotropic Medications: She reports she that most recently she was on [place]. She has also been on Haldol Decanoate in the past. Currently states that she received both of these medications within a few days of each other a little more than a month ago. She reports she has been on Prozac ( 20 mgrs) for the last 2 months  , Neurontin ( unsure of dose ) for two months , Cogentin ( unsure of dose). Psychological Evaluations: No Past Medical History: denies medical illnesses . Reports allergy to Zoloft ( hallucinations), and to Abilify ( describes possible dystonic reaction ) .  Past Medical History:  Diagnosis Date  . Burning with urination 05/03/2015  . Contraceptive management 05/03/2015  . Depression   . Dysmenorrhea 12/30/2013  . Heroin addiction ([place])   . Menorrhagia 12/30/2013  . Menstrual extraction 12/30/2013  . Migraines   . Social anxiety disorder 09/25/2016  . Suicidal ideations   . Vaginal odor 05/03/2015    Past Surgical History:  Procedure Laterality Date  . NO PAST SURGERIES     Family History: mother died 1.5 years ago, from [place]. Father alive. Has one brother. Family History  Problem Relation Age of Onset  . Depression Mother   . Hypertension Father   . Hyperlipidemia Father   . Cancer Paternal Grandmother        breast, uterine  . Cirrhosis Paternal Grandfather        due to alcohol   Family Psychiatric  History: reports mother had history of bipolar disorder. No suicides in family. Grandfather had history of alcohol use disorder Tobacco Screening:  smokes 1/2 PPD  Social History: single, no children, currently homeless, unemployed, denies legal  issues .  Social History   Substance and Sexual Activity  Alcohol Use Yes   Comment: BAC was clear     Social History   Substance and Sexual Activity  Drug Use Yes  . Types: Marijuana, Oxycodone   Comment: reports oxycodone and marijuana    Additional Social History: Marital status: Single    Pain Medications: Please see MAR Prescriptions: Please see MAR, prozac and cogentin per pt Over the Counter: Please see MAR History of alcohol / drug use?: Yes Longest period of sobriety (when/how long): Unknown Negative Consequences of Use: Legal Name of Substance 1: alcohol 1 - Frequency: UTA 1 - Duration: UTA- pt denies 1 - Last Use / Amount: UTA  Allergies:   Allergies  Allergen Reactions  . Abilify [Aripiprazole]   . Zoloft [Sertraline Hcl]    Lab Results:  Results for orders placed or performed during the hospital encounter of 10/15/19 (from the past 48 hour(s))  Respiratory Panel by RT PCR (Flu A&B, Covid) - Nasopharyngeal Swab     Status: None   Collection Time: 10/15/19  6:12 PM  Specimen: Nasopharyngeal Swab  Result Value Ref Range   SARS Coronavirus 2 by RT PCR NEGATIVE NEGATIVE    Comment: (NOTE) SARS-CoV-2 target nucleic acids are NOT DETECTED. The SARS-CoV-2 RNA is generally detectable in upper respiratoy specimens during the acute phase of infection. The lowest concentration of SARS-CoV-2 viral copies this assay can detect is 131 copies/mL. A negative result does not preclude SARS-Cov-2 infection and should not be used as the sole basis for treatment or other patient management decisions. A negative result may occur with  improper specimen collection/handling, submission of specimen other than nasopharyngeal swab, presence of viral mutation(s) within the areas targeted by this assay, and inadequate number of viral copies (<131 copies/mL). A negative result must be combined with clinical observations, patient history, and epidemiological information.  The expected result is Negative. Fact Sheet for Patients:  PinkCheek.be Fact Sheet for Healthcare Providers:  GravelBags.it This test is not yet ap proved or cleared by the [place] FDA and  has been authorized for detection and/or diagnosis of SARS-CoV-2 by FDA under an Emergency Use Authorization (EUA). This EUA will remain  in effect (meaning this test can be used) for the duration of the COVID-19 declaration under Section 564(b)(1) of the Act, 21 U.S.C. section 360bbb-3(b)(1), unless the authorization is terminated or revoked sooner.    Influenza A by PCR NEGATIVE NEGATIVE   Influenza B by PCR NEGATIVE NEGATIVE    Comment: (NOTE) The Xpert Xpress SARS-CoV-2/FLU/RSV assay is intended as an aid in  the diagnosis of influenza from Nasopharyngeal swab specimens and  should not be used as a sole basis for treatment. Nasal washings and  aspirates are unacceptable for Xpert Xpress SARS-CoV-2/FLU/RSV  testing. Fact Sheet for Patients: PinkCheek.be Fact Sheet for Healthcare Providers: GravelBags.it This test is not yet approved or cleared by the [place] FDA and  has been authorized for detection and/or diagnosis of SARS-CoV-2 by  FDA under an Emergency Use Authorization (EUA). This EUA will remain  in effect (meaning this test can be used) for the duration of the  Covid-19 declaration under Section 564(b)(1) of the Act, 21  U.S.C. section 360bbb-3(b)(1), unless the authorization is  terminated or revoked. Performed at [facility], [place] [address], [place], [place] [address]   CBC     Status: None   Collection Time: 10/16/19  6:31 AM  Result Value Ref Range   WBC 6.5 4.0 - 10.5 K/uL   RBC 4.70 3.87 - 5.11 MIL/uL   Hemoglobin 13.4 12.0 - 15.0 g/dL   HCT 42.6 36.0 - 46.0 %   MCV 90.6 80.0 - 100.0 fL   MCH 28.5 26.0 - 34.0 pg   MCHC 31.5  30.0 - 36.0 g/dL   RDW 13.2 11.5 - 15.5 %   Platelets 230 150 - 400 K/uL   nRBC 0.0 0.0 - 0.2 %    Comment: Performed at [facility], [place] [address], [place], [place] [address]  Comprehensive metabolic panel     Status: Abnormal   Collection Time: 10/16/19  6:31 AM  Result Value Ref Range   Sodium 140 135 - 145 mmol/L   Potassium 4.1 3.5 - 5.1 mmol/L   Chloride 106 98 - 111 mmol/L   CO2 25 22 - 32 mmol/L   Glucose, Bld 104 (H) 70 - 99 mg/dL    Comment: Glucose reference range applies only to samples taken after fasting for at least 8 hours.   BUN 10 6 - 20 mg/dL  Creatinine, Ser 0.90 0.44 - 1.00 mg/dL   Calcium 9.5 8.9 - 10.3 mg/dL   Total Protein 7.6 6.5 - 8.1 g/dL   Albumin 4.4 3.5 - 5.0 g/dL   AST 23 15 - 41 U/L   ALT 16 0 - 44 U/L   Alkaline Phosphatase 67 38 - 126 U/L   Total Bilirubin 0.5 0.3 - 1.2 mg/dL   GFR calc non Af Amer >60 >60 mL/min   GFR calc Af Amer >60 >60 mL/min   Anion gap 9 5 - 15    Comment: Performed at [facility], [place] [address] [place], [place] [address]  Hemoglobin A1c     Status: None   Collection Time: 10/16/19  6:31 AM  Result Value Ref Range   Hgb A1c MFr Bld 5.4 4.8 - 5.6 %    Comment: (NOTE) Pre diabetes:          5.7%-6.4% Diabetes:              >6.4% Glycemic control for   <7.0% adults with diabetes    Mean Plasma Glucose 108.28 mg/dL    Comment: Performed at [place] [address] [place], [place] [address]  Magnesium     Status: None   Collection Time: 10/16/19  6:31 AM  Result Value Ref Range   Magnesium 2.0 1.7 - 2.4 mg/dL    Comment: Performed at [facility], [place] [address], [place], [place] [address]  Ethanol     Status: None   Collection Time: 10/16/19  6:31 AM  Result Value Ref Range   Alcohol, Ethyl (B) <10 <10 mg/dL    Comment: (NOTE) Lowest detectable limit for serum alcohol is 10 mg/dL. For medical purposes only. Performed at [facility], [place] [address], [place], [place] [address]   Lipid panel     Status: Abnormal   Collection Time: 10/16/19  6:31 AM  Result Value Ref Range   Cholesterol 138 0 - 200 mg/dL   Triglycerides 60 <150 mg/dL   HDL 36 (L) >40 mg/dL   Total CHOL/HDL Ratio 3.8 RATIO   VLDL 12 0 - 40 mg/dL   LDL Cholesterol 90 0 - 99 mg/dL    Comment:        Total Cholesterol/HDL:CHD Risk Coronary Heart Disease Risk Table                     Men   Women  1/2 Average Risk   3.4   3.3  Average Risk       5.0   4.4  2 X Average Risk   9.6   7.1  3 X Average Risk  23.4   11.0        Use the calculated Patient Ratio above and the CHD Risk Table to determine the patient's CHD Risk.        ATP III CLASSIFICATION (LDL):  <100     mg/dL   Optimal  100-129  mg/dL   Near or Above                    Optimal  130-159  mg/dL   Borderline  160-189  mg/dL   High  >190     mg/dL   Very High Performed at [place] [address] [place], [place] [address]   Hepatic function panel     Status: None   Collection Time: 10/16/19  6:31 AM  Result Value Ref Range   Total Protein 7.6 6.5 - 8.1 g/dL   Albumin 4.4 3.5 - 5.0 g/dL   AST 23 15 - 41 U/L   ALT 16 0 - 44 U/L   Alkaline Phosphatase 67 38 - 126 U/L   Total Bilirubin 0.5 0.3 - 1.2 mg/dL   Bilirubin, Direct 0.1 0.0 - 0.2 mg/dL   Indirect Bilirubin 0.4 0.3 - 0.9 mg/dL    Comment: Performed at [facility], [place] [address] [place], [place] [address]    Blood Alcohol level:  Lab Results  Component Value Date   [facility] <10 10/16/2019   ETH <10 74/06/8785    Metabolic Disorder Labs:  Lab Results  Component Value Date   HGBA1C 5.4 10/16/2019   MPG 108.28 10/16/2019   MPG 102.54 05/17/2019   Lab Results  Component Value Date   PROLACTIN 31.9 (H) 02/17/2019   PROLACTIN 29.6 (H) 09/25/2016   Lab Results  Component Value Date   CHOL 138 10/16/2019   TRIG 60 10/16/2019   HDL 36 (L) 10/16/2019    CHOLHDL 3.8 10/16/2019   VLDL 12 10/16/2019   LDLCALC 90 10/16/2019   LDLCALC 96 05/17/2019    Current Medications: Current Facility-Administered Medications  Medication Dose Route Frequency Provider Last Rate Last Admin  . acetaminophen (TYLENOL) tablet 650 mg  650 mg Oral Q6H PRN Emmaline Kluver, FNP      . alum & mag hydroxide-simeth (MAALOX/MYLANTA) 200-200-20 MG/5ML suspension 30 mL  30 mL Oral Q4H PRN Emmaline Kluver, FNP      . FLUoxetine (PROZAC) capsule 20 mg  20 mg Oral Daily Rankin, Shuvon B, NP   20 mg at 10/16/19 1001  . gabapentin (NEURONTIN) capsule 400 mg  400 mg Oral TID Rankin, Shuvon B, NP   400 mg at 10/16/19 1136  . hydrOXYzine (ATARAX/VISTARIL) tablet 25 mg  25 mg Oral TID PRN Emmaline Kluver, FNP      . magnesium hydroxide (MILK OF MAGNESIA) suspension 30 mL  30 mL Oral Daily PRN Emmaline Kluver, FNP      . traZODone (DESYREL) tablet 50 mg  50 mg Oral QHS PRN Emmaline Kluver, FNP       PTA Medications: Medications Prior to Admission  Medication Sig Dispense Refill Last Dose  . ibuprofen (ADVIL) 400 MG tablet Take 400 mg by mouth every 6 (six) hours as needed for headache or mild pain.     . benztropine (COGENTIN) 1 MG tablet Take 1 tablet (1 mg total) by mouth 2 (two) times daily. (Patient not taking: Reported on 10/16/2019) 60 tablet 1 Not Taking at Unknown time  . FLUoxetine (PROZAC) 20 MG capsule Take 1 capsule (20 mg total) by mouth daily. (Patient not taking: Reported on 10/16/2019) 30 capsule 1 Not Taking at Unknown time  . gabapentin (NEURONTIN) 400 MG capsule Take 1 capsule (400 mg total) by mouth 3 (three) times daily. (Patient not taking: Reported on 10/16/2019) 90 capsule 1 Not Taking at Unknown time  . haloperidol decanoate (HALDOL DECANOATE) 100 MG/ML injection Inject 0.5 mLs (50 mg total) into the muscle every 30 (thirty) days. NEXT DOSE DUE AROUND September 15, 2019 (Patient not taking: Reported on 10/16/2019) 1 mL 1 Not Taking at Unknown time  . paliperidone (INVEGA SUSTENNA) 156  MG/ML SUSY injection Inject 1 mL (156 mg total) into the muscle every 30 (thirty) days. NEXT DOSE DUE AROUND September 15, 2019 (Patient not taking: Reported  on 10/16/2019) 1.2 mL 1 Not Taking at Unknown time  . paliperidone (INVEGA) 3 MG 24 hr tablet Take 1 tablet (3 mg total) by mouth daily. (Patient not taking: Reported on 10/16/2019) 30 tablet 1 Not Taking at Unknown time  . traZODone (DESYREL) 50 MG tablet Take 1 tablet (50 mg total) by mouth at bedtime. (Patient not taking: Reported on 10/16/2019) 30 tablet 1 Not Taking at Unknown time    Musculoskeletal: Strength & Muscle Tone: within normal limits Gait & Station: normal Patient leans: N/A  Psychiatric Specialty Exam: Physical Exam  Review of Systems  Constitutional: Negative.   HENT: Negative.   Respiratory: Negative.  Negative for cough and shortness of breath.   Cardiovascular: Negative.  Negative for chest pain.  Gastrointestinal: Negative.  Negative for diarrhea, nausea and vomiting.  Endocrine: Negative.   Genitourinary: Positive for dysuria.  Musculoskeletal: Negative.   Skin: Negative.  Negative for rash.  Allergic/Immunologic: Negative.   Neurological: Negative.  Negative for seizures and headaches.  Hematological: Negative.   Psychiatric/Behavioral: Positive for suicidal ideas.  All other systems reviewed and are negative.   Blood pressure 108/77, pulse (!) 135, temperature 98.1 F (36.7 C), temperature source Oral, resp. rate 20, height '5\' 3"'$  (1.6 m), weight 67.8 kg, SpO2 100 %.Body mass index is 26.48 kg/m.  General Appearance: Fairly Groomed  Eye Contact:  Good  Speech:  Slow  Volume:  Decreased  Mood:  Depressed  Affect:  blunted, flat   Thought Process:  Linear and Descriptions of Associations: Circumstantial  Orientation:  Other:  alert, attentive, oriented x 3   Thought Content:  paranoid delusions and hallucinations as above  Suicidal Thoughts:  No currently denies suicidal or self injurious ideations and  contracts for safety on unit, denies homicidal or violent ideations   Homicidal Thoughts:  No  Memory:  3/3 immediate   Judgement:  Fair  Insight:  Fair  Psychomotor Activity:  Decreased  Concentration:  Concentration: Fair and Attention Span: Fair  Recall:  [employer] of Knowledge:  Fair  Language:  Good  Akathisia:  Negative  Handed:  Right  AIMS (if indicated):     Assets:  Desire for Improvement Resilience  ADL's:  Intact  Cognition:  WNL  Sleep:       Treatment Plan Summary: Daily contact with patient to assess and evaluate symptoms and progress in treatment, Medication management, Plan inpatient treatment  and medications as below  Observation Level/Precautions:  15 minute checks  Laboratory:  UA, U Preg, EKG to monitor QTc ( 1/24 EKG QTc 437)   Psychotherapy:  [facility], group therapy   Medications:  Patient reports she received Invega Sustenna and Haldol Decanoate within 2-3 days of each other a little more than a month ago. Denies side effects but states Lorayne Bender has been better for her . She also reports she has been taking Prozac and Neurontin over the last several weeks , without side effects. Increase Prozac to 40 mgrs QDAY  Start PO Invega 6 mgrs QDAY  Start Neurontin 100 mgrs TID Haldol/Ativan PO /IM PRN for psychotic agitation if needed   Consultations:  As needed   Discharge Concerns: homelessness    Estimated LOS: 5 days   Other:     Physician Treatment Plan for Primary Diagnosis: Schizoaffective Disorder  Long Term Goal(s): Improvement in symptoms so as ready for discharge  Short Term Goals: Ability to identify changes in lifestyle to reduce recurrence of condition will improve, Ability to verbalize feelings  will improve, Ability to disclose and discuss suicidal ideas, Ability to demonstrate self-control will improve, Ability to identify and develop effective coping behaviors will improve, Ability to maintain clinical measurements within normal limits will improve  and Compliance with prescribed medications will improve  Physician Treatment Plan for Secondary Diagnosis: Psychosis Long Term Goal(s): Improvement in symptoms so as ready for discharge  Short Term Goals: Ability to identify changes in lifestyle to reduce recurrence of condition will improve, Ability to verbalize feelings will improve, Ability to disclose and discuss suicidal ideas, Ability to demonstrate self-control will improve, Ability to identify and develop effective coping behaviors will improve, Ability to maintain clinical measurements within normal limits will improve and Compliance with prescribed medications will improve  I certify that inpatient services furnished can reasonably be expected to improve the patient's condition.    Jenne Campus, MD 4/3/20213:53 PM

## 2019-10-16 NOTE — Progress Notes (Deleted)
Report given to  [facility] on 500 unit. Patient transported to 500 unit by [employer] without incident.

## 2019-10-17 DIAGNOSIS — R4589 Other symptoms and signs involving emotional state: Secondary | ICD-10-CM | POA: Diagnosis not present

## 2019-10-17 MED ORDER — NICOTINE POLACRILEX 2 MG MT GUM
CHEWING_GUM | OROMUCOSAL | Status: AC
  Filled 2019-10-17: qty 1

## 2019-10-17 MED ORDER — NICOTINE POLACRILEX 2 MG MT GUM
2.0000 mg | CHEWING_GUM | OROMUCOSAL | Status: DC | PRN
  Administered 2019-10-17 – 2019-11-14 (×54): 2 mg via ORAL
  Filled 2019-10-17 (×2): qty 1
  Filled 2019-10-17: qty 9
  Filled 2019-10-17 (×7): qty 1
  Filled 2019-10-17: qty 10
  Filled 2019-10-17 (×2): qty 1
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
## 2019-10-17 NOTE — [facility] Suicide Risk Assessment (Signed)
[place] INPATIENT:  Family/Significant Other Suicide Prevention Education  Suicide Prevention Education:  Education Completed;  father, Lindsy Colan [phone],  (name of family member/significant other) has been identified by the patient as the family member/significant other with whom the patient will be residing, and identified as the person(s) who will aid the patient in the event of a mental health crisis (suicidal ideations/suicide attempt).  With written consent from the patient, the family member/significant other has been provided the following suicide prevention education, prior to the and/or following the discharge of the patient.  Father states that patient was indeed evicted from the place where she was living, [name] [phone]), which was a rehab/group home for her substance abuse problems.  Father would send money to her when he could, but mostly the group home and ACTT Team were working with patient to get her on disability.     Father does not believe she will ever be able to hold a job, states he has been trying to help and she keeps going back to the drugs.  "Every time it gets worse."  Her favorite drugs are alcohol and Xanax, but "she's used some of al of it, I think."  Coming to live with him is not an option, as he does not have his own home but stays with a friend.  Father states he used to have to bring patient's mother here to [place] every 2 years.  There is a history of Bipolar disorder and drug/alcohol problems in mother, maternal grandmother, and maternal great-grandmother.    The suicide prevention education provided includes the following:  Suicide risk factors  Suicide prevention and interventions  National Suicide Hotline telephone number  [facility] assessment telephone number  [facility] Emergency Assistance [place] and/or Residential Mobile Crisis Unit telephone number  Request made of family/significant  other to:  Remove weapons (e.g., guns, rifles, knives), all items previously/currently identified as safety concern.    Remove drugs/medications (over-the-counter, prescriptions, illicit drugs), all items previously/currently identified as a safety concern.  The family member/significant other verbalizes understanding of the suicide prevention education information provided.  The family member/significant other agrees to remove the items of safety concern listed above.  [name] 10/17/2019, 2:40 PM

## 2019-10-17 NOTE — Progress Notes (Signed)
   10/17/19 1014  Psych Admission Type (Psych Patients Only)  Admission Status Voluntary  Psychosocial Assessment  Patient Complaints None  Eye Contact Brief  Facial Expression Flat  Affect Flat  Speech Soft  Interaction Forwards little;Guarded  Motor Activity Other (Comment) (steady gait)  Appearance/Hygiene Disheveled  Behavior Characteristics Cooperative  Mood Depressed  Thought Process  Coherency WDL  Content WDL  Delusions WDL  Perception WDL  Hallucination None reported or observed  Judgment Impaired  Confusion None  Danger to Self  Current suicidal ideation? Denies  Self-Injurious Behavior No self-injurious ideation or behavior indicators observed or expressed   Agreement Not to Harm Self Yes  Danger to Others  Danger to Others None reported or observed
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
## 2019-10-17 NOTE — Progress Notes (Signed)
EKG results placed on the outside of shadow chart  Normal sinus rhythm Normal ECG  QT/QTc-Baz  400/452 ms

## 2019-10-17 NOTE — Progress Notes (Signed)
Psychoeducational Group Note  Date:  10/16/2019 Time:  2021  Group Topic/Focus:  wrap up group  Participation Level: Did Not Attend  Participation Quality:  Not Applicable  Affect:  Not Applicable  Cognitive:  Not Applicable  Insight:  Not Applicable  Engagement in Group: Not Applicable  Additional Comments:  Pt was asleep during group time.   Winfield Rast S 10/17/2019, 12:26 AM

## 2019-10-18 DIAGNOSIS — R4589 Other symptoms and signs involving emotional state: Secondary | ICD-10-CM | POA: Diagnosis not present

## 2019-10-18 LAB — PROLACTIN: Prolactin: 152 ng/mL — ABNORMAL HIGH (ref 4.8–23.3)

## 2019-10-18 NOTE — Progress Notes (Signed)
Adult Psychoeducational Group Note  Date:  10/18/2019 Time:  8:57 PM  Group Topic/Focus:  Wrap-Up Group:   The focus of this group is to help patients review their daily goal of treatment and discuss progress on daily workbooks.  Participation Level:  Minimal  Participation Quality:  Appropriate  Affect:  Flat  Cognitive:  Oriented  Insight: Appropriate  Engagement in Group:  Engaged  Modes of Intervention:  Education and Support  Additional Comments:  Patient attended and participated in group tonight. She reports having a good day. She talked to her dad and her boyfriend today. She also talked with her doctor and went for her meals today.  Salley Scarlet [facility] 10/18/2019, 8:57 PM

## 2019-10-18 NOTE — Progress Notes (Signed)
Recreation Therapy Notes  Patient admitted to unit 4.3.21. Due to admission within last year, no new assessment conducted at this time. Last assessment conducted 11.3.20. Patient reports being homeless as curent stressor.  Patient stated reason for current admission was "feeling suicidal".  Patient still identifies isolation, journal, arguments, music, substance abuse, impulsivity, talk, prayer, avoidance, read and hot bath/shower as coping skills.  Patient also states laying down and being outside are still leisure interests.  Patient stated she has no areas of improvement and identifies strength as being smart.   Patient denies SI, HI, AVH at this time. Patient states she doesn't have a goal for this hospitalization.     [name], LRT/CTRS   [name] A 10/18/2019 12:38 PM
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
## 2019-10-18 NOTE — Tx Team (Signed)
Interdisciplinary Treatment and Diagnostic Plan Update  10/18/2019 Time of Session: 9:00am Julia [name] MRN: [id number]  Principal Diagnosis: Depressed mood  Secondary Diagnoses: Principal Problem:   Depressed mood Active Problems:   MDD (major depressive disorder), recurrent, severe, with psychosis ([place])   MDD (major depressive disorder)   Current Medications:  Current Facility-Administered Medications  Medication Dose Route Frequency Provider Last Rate Last Admin  . acetaminophen (TYLENOL) tablet 650 mg  650 mg Oral Q6H PRN Emmaline Kluver, FNP      . alum & mag hydroxide-simeth (MAALOX/MYLANTA) 200-200-20 MG/5ML suspension 30 mL  30 mL Oral Q4H PRN Emmaline Kluver, FNP      . benztropine (COGENTIN) tablet 1 mg  1 mg Oral BID PRN Cobos, Myer Peer, MD      . FLUoxetine (PROZAC) capsule 40 mg  40 mg Oral Daily Cobos, Myer Peer, MD   40 mg at 10/18/19 0817  . gabapentin (NEURONTIN) capsule 100 mg  100 mg Oral TID Cobos, Myer Peer, MD   100 mg at 10/18/19 0817  . haloperidol (HALDOL) tablet 5 mg  5 mg Oral Q6H PRN Cobos, Myer Peer, MD       Or  . haloperidol lactate (HALDOL) injection 5 mg  5 mg Intramuscular Q6H PRN Cobos, Myer Peer, MD      . LORazepam (ATIVAN) tablet 2 mg  2 mg Oral Q6H PRN Cobos, Myer Peer, MD       Or  . LORazepam (ATIVAN) injection 2 mg  2 mg Intramuscular Q6H PRN Cobos, Fernando A, MD      . magnesium hydroxide (MILK OF MAGNESIA) suspension 30 mL  30 mL Oral Daily PRN Emmaline Kluver, FNP      . nicotine polacrilex (NICORETTE) gum 2 mg  2 mg Oral PRN Cobos, Myer Peer, MD   2 mg at 10/17/19 1902  . paliperidone (INVEGA) 24 hr tablet 6 mg  6 mg Oral Daily Cobos, Myer Peer, MD   6 mg at 10/18/19 5974   PTA Medications: Medications Prior to Admission  Medication Sig Dispense Refill Last Dose  . ibuprofen (ADVIL) 400 MG tablet Take 400 mg by mouth every 6 (six) hours as needed for headache or mild pain.     . benztropine (COGENTIN) 1 MG tablet Take 1 tablet (1 mg  total) by mouth 2 (two) times daily. (Patient not taking: Reported on 10/16/2019) 60 tablet 1 Not Taking at Unknown time  . FLUoxetine (PROZAC) 20 MG capsule Take 1 capsule (20 mg total) by mouth daily. (Patient not taking: Reported on 10/16/2019) 30 capsule 1 Not Taking at Unknown time  . gabapentin (NEURONTIN) 400 MG capsule Take 1 capsule (400 mg total) by mouth 3 (three) times daily. (Patient not taking: Reported on 10/16/2019) 90 capsule 1 Not Taking at Unknown time  . haloperidol decanoate (HALDOL DECANOATE) 100 MG/ML injection Inject 0.5 mLs (50 mg total) into the muscle every 30 (thirty) days. NEXT DOSE DUE AROUND September 15, 2019 (Patient not taking: Reported on 10/16/2019) 1 mL 1 Not Taking at Unknown time  . paliperidone (INVEGA SUSTENNA) 156 MG/ML SUSY injection Inject 1 mL (156 mg total) into the muscle every 30 (thirty) days. NEXT DOSE DUE AROUND September 15, 2019 (Patient not taking: Reported on 10/16/2019) 1.2 mL 1 Not Taking at Unknown time  . paliperidone (INVEGA) 3 MG 24 hr tablet Take 1 tablet (3 mg total) by mouth daily. (Patient not taking: Reported on 10/16/2019) 30 tablet 1 Not Taking at  Unknown time  . traZODone (DESYREL) 50 MG tablet Take 1 tablet (50 mg total) by mouth at bedtime. (Patient not taking: Reported on 10/16/2019) 30 tablet 1 Not Taking at Unknown time    Patient Stressors:    Patient Strengths:    Treatment Modalities: Medication Management, Group therapy, Case management,  1 to 1 session with clinician, Psychoeducation, Recreational therapy.   Physician Treatment Plan for Primary Diagnosis: Depressed mood Long Term Goal(s): Improvement in symptoms so as ready for discharge Improvement in symptoms so as ready for discharge   Short Term Goals: Ability to identify changes in lifestyle to reduce recurrence of condition will improve Ability to verbalize feelings will improve Ability to disclose and discuss suicidal ideas Ability to demonstrate self-control will improve Ability  to identify and develop effective coping behaviors will improve Ability to maintain clinical measurements within normal limits will improve Compliance with prescribed medications will improve Ability to identify changes in lifestyle to reduce recurrence of condition will improve Ability to verbalize feelings will improve Ability to disclose and discuss suicidal ideas Ability to demonstrate self-control will improve Ability to identify and develop effective coping behaviors will improve Ability to maintain clinical measurements within normal limits will improve Compliance with prescribed medications will improve  Medication Management: Evaluate patient's response, side effects, and tolerance of medication regimen.  Therapeutic Interventions: 1 to 1 sessions, Unit Group sessions and Medication administration.  Evaluation of Outcomes: Not Met  Physician Treatment Plan for Secondary Diagnosis: Principal Problem:   Depressed mood Active Problems:   MDD (major depressive disorder), recurrent, severe, with psychosis ([place])   MDD (major depressive disorder)  Long Term Goal(s): Improvement in symptoms so as ready for discharge Improvement in symptoms so as ready for discharge   Short Term Goals: Ability to identify changes in lifestyle to reduce recurrence of condition will improve Ability to verbalize feelings will improve Ability to disclose and discuss suicidal ideas Ability to demonstrate self-control will improve Ability to identify and develop effective coping behaviors will improve Ability to maintain clinical measurements within normal limits will improve Compliance with prescribed medications will improve Ability to identify changes in lifestyle to reduce recurrence of condition will improve Ability to verbalize feelings will improve Ability to disclose and discuss suicidal ideas Ability to demonstrate self-control will improve Ability to identify and develop effective coping  behaviors will improve Ability to maintain clinical measurements within normal limits will improve Compliance with prescribed medications will improve     Medication Management: Evaluate patient's response, side effects, and tolerance of medication regimen.  Therapeutic Interventions: 1 to 1 sessions, Unit Group sessions and Medication administration.  Evaluation of Outcomes: Not Met   RN Treatment Plan for Primary Diagnosis: Depressed mood Long Term Goal(s): Knowledge of disease and therapeutic regimen to maintain health will improve  Short Term Goals: Ability to participate in decision making will improve, Ability to verbalize feelings will improve, Ability to identify and develop effective coping behaviors will improve and Compliance with prescribed medications will improve  Medication Management: RN will administer medications as ordered by provider, will assess and evaluate patient's response and provide education to patient for prescribed medication. RN will report any adverse and/or side effects to prescribing provider.  Therapeutic Interventions: 1 on 1 counseling sessions, Psychoeducation, Medication administration, Evaluate responses to treatment, Monitor vital signs and CBGs as ordered, Perform/monitor CIWA, COWS, AIMS and Fall Risk screenings as ordered, Perform wound care treatments as ordered.  Evaluation of Outcomes: Not Met   LCSW Treatment Plan  for Primary Diagnosis: Depressed mood Long Term Goal(s): Safe transition to appropriate next level of care at discharge, Engage patient in therapeutic group addressing interpersonal concerns.  Short Term Goals: Engage patient in aftercare planning with referrals and resources, Increase social support, Increase emotional regulation, Identify triggers associated with mental health/substance abuse issues and Increase skills for wellness and recovery  Therapeutic Interventions: Assess for all discharge needs, 1 to 1 time with Social  worker, Explore available resources and support systems, Assess for adequacy in community support network, Educate family and significant other(s) on suicide prevention, Complete Psychosocial Assessment, Interpersonal group therapy.  Evaluation of Outcomes: Not Met  Progress in Treatment: Attending groups: No. Participating in groups: No. Taking medication as prescribed: Yes. Toleration medication: Yes. Family/Significant other contact made: Yes, individual(s) contacted:  father, Julia [name]. Patient understands diagnosis: Yes. Discussing patient identified problems/goals with staff: Yes. Medical problems stabilized or resolved: Yes. Denies suicidal/homicidal ideation: No. Issues/concerns per patient self-inventory: Yes.  New problem(s) identified: Yes, Describe:  left her group home recently, unsure if she can return.  New Short Term/Long Term Goal(s): medication management for mood stabilization; elimination of SI thoughts; development of comprehensive mental wellness/sobriety plan.  Patient Goals:    Discharge Plan or Barriers: CSW following to determine if patient can return to her group home. Patient is established with [name] for ACTT services.  Reason for Continuation of Hospitalization: Anxiety Delusions  Depression Hallucinations Medication stabilization  Estimated Length of Stay: 3-5 days  Attendees: Patient: 10/18/2019 10:04 AM  Physician: Dr.Farah 10/18/2019 10:04 AM  Nursing:  10/18/2019 10:04 AM  RN Care Manager: 10/18/2019 10:04 AM  Social Worker: [name], [place] 10/18/2019 10:04 AM  Recreational Therapist:  10/18/2019 10:04 AM  Other:  10/18/2019 10:04 AM  Other:  10/18/2019 10:04 AM  Other: 10/18/2019 10:04 AM    Scribe for Treatment Team: Joellen [place], [place] 10/18/2019 10:04 AM

## 2019-10-18 NOTE — Progress Notes (Signed)
Recreation Therapy Notes  Date: 4.5.21 Time: 1000 Location: [address] Hall Dayroom  Group Topic: Coping Skills  Goal Area(s) Addresses:  Patient will identify positive coping strategies. Patient will identify benefit of using coping strategies post d/c.  Behavioral Response: Minimal  Intervention: Worksheet  Activity: Healthy vs. Unhealthy Coping Strategies.  Patients were to identify a current problem they are currently dealing with.  Patients also had to identify unhealthy coping strategies and consequences of these coping strategies.  Lastly, patients identified healthy coping strategies, expected outcomes and barriers to using these coping strategies.  Education: Radiographer, therapeutic, Dentist.   Education Outcome: Acknowledges understanding/In group clarification offered/Needs additional education.   Clinical Observations/Feedback: Pt identified current problem as homelessness.  Unhealthy coping strategies were identified as procrastination and overreacting.  Pt did not identify consequences of these coping skills or any healthy coping skills to counter the unhealthy coping strategies.    [name], LRT/CTRS         [name] A 10/18/2019 12:02 PM

## 2019-10-18 NOTE — Progress Notes (Signed)
   10/17/19 2030  Psych Admission Type (Psych Patients Only)  Admission Status Voluntary  Psychosocial Assessment  Patient Complaints None  Eye Contact Brief  Facial Expression Flat  Affect Flat  Speech Soft  Interaction Guarded;No initiation  Motor Activity Other (Comment) (steady gait)  Appearance/Hygiene Improved  Behavior Characteristics Appropriate to situation  Mood Pleasant;Depressed  Thought Process  Coherency WDL  Content WDL  Delusions WDL  Perception WDL  Hallucination None reported or observed  Judgment Impaired  Confusion None  Danger to Self  Current suicidal ideation? Denies  Self-Injurious Behavior No self-injurious ideation or behavior indicators observed or expressed   Agreement Not to Harm Self Yes  Danger to Others  Danger to Others None reported or observed

## 2019-10-18 NOTE — Progress Notes (Signed)
   10/17/19 2030  COVID-19 Daily Checkoff  Have you had a fever (temp > 37.80C/100F)  in the past 24 hours?  No  If you have had runny nose, nasal congestion, sneezing in the past 24 hours, has it worsened? No  COVID-19 EXPOSURE  Have you traveled outside the state in the past 14 days? No  Have you been in contact with someone with a confirmed diagnosis of COVID-19 or PUI in the past 14 days without wearing appropriate PPE? No  Have you been living in the same home as a person with confirmed diagnosis of COVID-19 or a PUI (household contact)? No  Have you been diagnosed with COVID-19? No

## 2019-10-18 NOTE — Progress Notes (Signed)
[name] Progress Note  10/18/2019 11:55 AM Julia [name]  MRN:  [id number] Subjective:   From the HPI [age] year old female, known to [name] from prior admissions, presented to [name] voluntarily reporting suicidal ideations, worsening depression over the last month or so, neuro-vegetative symptoms". She also presents with psychotic symptoms as noted below. She had been staying in a [name] over the last month or so, but was kicked out 2- days ago due to an altercation with other clients and she is now homeless . She reports history of intermittent suicidal ideations , recently worsening . Describes these as passive. She reports she feels she is " being tortured " by a man . States she has never actually met this person- " I don't know who he is but he is a pedophile , he puts thoughts in my head , harasses [name] sexually"  She states "it is hard to explain , he thinks about those things and about [name] too at the same time". She states she sometimes hears his voice , saying " you are Daddy's little girl  Further, patient known to [name] due to coverage at [name] on weekends  The patient is in bed she is alert and fully oriented to person place time situation not exact date however she knows the recent holiday.  She is guarded and quiet she reports continued worries about "the man" which implies she is still delusional but she denies current auditory or visual hallucinations she denies wanting to harm herself.  Can contract for safety here only Principal Problem: Depressed mood Diagnosis: Principal Problem:   Depressed mood Active Problems:   MDD (major depressive disorder), recurrent, severe, with psychosis ([name])   MDD (major depressive disorder)  Total Time spent with patient: 20 minutes  Past Psychiatric History: see eval  Past Medical History:  Past Medical History:  Diagnosis Date  . Burning with urination 05/03/2015  . Contraceptive management 05/03/2015  . Depression   . Dysmenorrhea  12/30/2013  . Heroin addiction ([place])   . Menorrhagia 12/30/2013  . Menstrual extraction 12/30/2013  . Migraines   . Social anxiety disorder 09/25/2016  . Suicidal ideations   . Vaginal odor 05/03/2015    Past Surgical History:  Procedure Laterality Date  . NO PAST SURGERIES     Family History:  Family History  Problem Relation Age of Onset  . Depression Mother   . Hypertension Father   . Hyperlipidemia Father   . Cancer Paternal Grandmother        breast, uterine  . Cirrhosis Paternal Grandfather        due to alcohol   Family Psychiatric  History: see eval Social History:  Social History   Substance and Sexual Activity  Alcohol Use Yes   Comment: BAC was clear     Social History   Substance and Sexual Activity  Drug Use Yes  . Types: Marijuana, Oxycodone   Comment: reports oxycodone and marijuana    Social History   Socioeconomic History  . Marital status: Single    Spouse name: Not on file  . Number of children: Not on file  . Years of education: Not on file  . Highest education level: Not on file  Occupational History  . Occupation: Unemployed  Tobacco Use  . Smoking status: Current Every Day Smoker    Packs/day: 0.50    Types: Cigarettes  . Smokeless tobacco: Never Used  . Tobacco comment: Not ready to quit  Substance  and Sexual Activity  . Alcohol use: Yes    Comment: BAC was clear  . Drug use: Yes    Types: Marijuana, Oxycodone    Comment: reports oxycodone and marijuana  . Sexual activity: Yes    Birth control/protection: Implant  Other Topics Concern  . Not on file  Social History Narrative   06/23/2019:  Pt stated that she is homeless, that she is a high school graduate, and that she is unemployed and not followed by any outpatient provider.      Lives with Dad. Mom has LGD, lives in [name]. 11th grader. Dog.   Social Determinants of Health   Financial Resource Strain:   . Difficulty of Paying Living Expenses:   Food Insecurity:   . Worried  About [name] in the Last Year:   . Arboriculturist in the Last Year:   [name] Needs:   . Julia [name] (Medical):   Julia [name] (Non-Medical):   Physical Activity:   . Days of Exercise per Week:   . Minutes of Exercise per Session:   Stress:   . Feeling of Stress :   Social Connections:   . Frequency of Communication with Friends and Family:   . Frequency of Social Gatherings with Friends and Family:   . Attends Religious Services:   . Active Member of Clubs or Organizations:   . Attends Archivist Meetings:   [name] Kitchen Marital Status:    Additional Social History:    Pain Medications: Please see MAR Prescriptions: Please see MAR, prozac and [name] per pt Over the Counter: Please see MAR History of alcohol / drug use?: Yes Longest period of sobriety (when/how long): Unknown Negative Consequences of Use: Legal Name of Substance 1: alcohol 1 - Frequency: UTA 1 - Duration: UTA- pt denies 1 - Last Use / Amount: UTA                  Sleep: Good  Appetite:  Good  Current Medications: Current Facility-Administered Medications  Medication Dose Route Frequency Provider Last Rate Last Admin  . acetaminophen (Julia [name]) tablet 650 mg  650 mg Oral Q6H PRN Julia Kluver, Julia [name]      . alum & mag hydroxide-simeth (Julia [name]) 200-200-20 MG/5ML suspension 30 mL  30 mL Oral Q4H PRN Julia Kluver, Julia [name]      . benztropine (Julia [name]) tablet 1 mg  1 mg Oral BID PRN Julia [name], Julia Peer, Julia [name]      . FLUoxetine (PROZAC) capsule 40 mg  40 mg Oral Daily Julia [name], Julia Peer, Julia [name]   40 mg at 10/18/19 0817  . gabapentin (NEURONTIN) capsule 100 mg  100 mg Oral TID Julia [name], Julia Peer, Julia [name]   100 mg at 10/18/19 0817  . haloperidol (HALDOL) tablet 5 mg  5 mg Oral Q6H PRN Julia [name], Julia Peer, Julia [name]       Or  . haloperidol lactate (HALDOL) injection 5 mg  5 mg Intramuscular Q6H PRN Julia [name], Julia Peer, Julia [name]      . LORazepam (ATIVAN) tablet 2 mg  2 mg Oral Q6H PRN Julia [name],  Julia Peer, Julia [name]       Or  . LORazepam (ATIVAN) injection 2 mg  2 mg Intramuscular Q6H PRN Julia [name], Fernando A, Julia [name]      . magnesium hydroxide (MILK OF MAGNESIA) suspension 30 mL  30 mL Oral Daily PRN Julia Kluver, Julia [name]      . nicotine polacrilex (NICORETTE) gum 2 mg  2 mg Oral PRN Julia [name], Julia Peer, Julia [name]   2 mg at 10/17/19 1902  . paliperidone (INVEGA) 24 hr tablet 6 mg  6 mg Oral Daily Julia [name], Julia Peer, Julia [name]   6 mg at 10/18/19 5697    Lab Results:  Results for orders placed or performed during the hospital encounter of 10/15/19 (from the past 48 hour(s))  Pregnancy, urine     Status: None   Collection Time: 10/16/19  4:37 PM  Result Value Ref Range   Preg Test, Ur NEGATIVE NEGATIVE    Comment:        THE SENSITIVITY OF THIS METHODOLOGY IS >20 mIU/mL. Performed at [name], [place] [address], [place], [place] [address]     Blood Alcohol level:  Lab Results  Component Value Date   [name] <10 10/16/2019   ETH <10 65/53/7482    Metabolic Disorder Labs: Lab Results  Component Value Date   HGBA1C 5.4 10/16/2019   MPG 108.28 10/16/2019   MPG 102.54 05/17/2019   Lab Results  Component Value Date   PROLACTIN 152.0 (H) 10/16/2019   PROLACTIN 31.9 (H) 02/17/2019   Lab Results  Component Value Date   CHOL 138 10/16/2019   TRIG 60 10/16/2019   HDL 36 (L) 10/16/2019   CHOLHDL 3.8 10/16/2019   VLDL 12 10/16/2019   LDLCALC 90 10/16/2019   LDLCALC 96 05/17/2019    Physical Findings: AIMS: Facial and Oral Movements Muscles of Facial Expression: None, normal Lips and Perioral Area: None, normal Jaw: None, normal Tongue: None, normal,Extremity Movements Upper (arms, wrists, hands, fingers): None, normal Lower (legs, knees, ankles, toes): None, normal, Trunk Movements Neck, shoulders, hips: None, normal, Overall Severity Severity of abnormal movements (highest score from questions above): None, normal Incapacitation due to abnormal movements: None, normal Patient's  awareness of abnormal movements (rate only patient's report): No Awareness, Dental Status Current problems with teeth and/or dentures?: No Does patient usually wear dentures?: No  CIWA:  CIWA-Ar Total: 1 COWS:  COWS Total Score: 2  Musculoskeletal: Strength & Muscle Tone: within normal limits Gait & Station: normal Patient leans: N/A  Psychiatric Specialty Exam: Physical Exam  Review of Systems  Blood pressure 100/75, pulse (!) 132, temperature 98.1 F (36.7 C), temperature source Oral, resp. rate 20, height _0  (1.6 m), weight 67.8 kg, SpO2 100 %.Body mass index is 26.48 kg/m.  General Appearance: Casual  Eye Contact:  Minimal  Speech:  Clear and Coherent  Volume:  Decreased  Mood:  Dysphoric  Affect:  Flat  Thought Process:  Goal Directed  Orientation:  Full (Time, Place, and Person)  Thought Content:  Delusions and Hallucinations: Auditory  Suicidal Thoughts:  Yes.  without intent/plan  Homicidal Thoughts:  No  Memory:  Immediate;   Fair Recent;   Fair Remote;   Fair  Judgement:  Impaired  Insight:  Shallow  Psychomotor Activity:  Normal  Concentration:  Concentration: Fair and Attention Span: Fair  Recall:  [employer] of Knowledge:  Fair  Language:  Good  Akathisia:  Negative  Handed:  Right  AIMS (if indicated):     Assets:  Physical Health Resilience Social Support  ADL's:  Intact  Cognition:  WNL  Sleep:        Treatment Plan Summary: Daily contact with patient to assess and evaluate symptoms and progress in treatment and Medication management  Continue Invega and fluoxetine patient may have had long-acting injectable we will check the old chart no change in  precautions continue cognitive and reality-based therapies  [name], [name] 10/18/2019, 11:55 AM

## 2019-10-19 DIAGNOSIS — R4589 Other symptoms and signs involving emotional state: Secondary | ICD-10-CM | POA: Diagnosis not present

## 2019-10-19 MED ORDER — PALIPERIDONE ER 3 MG PO TB24
9.0000 mg | ORAL_TABLET | Freq: Every day | ORAL | Status: DC
  Administered 2019-10-20 – 2019-11-08 (×20): 9 mg via ORAL
  Filled 2019-10-19 (×23): qty 3

## 2019-10-19 MED ORDER — TRAZODONE HCL 50 MG PO TABS
50.0000 mg | ORAL_TABLET | Freq: Every evening | ORAL | Status: DC | PRN
  Administered 2019-10-20 – 2019-10-29 (×8): 50 mg via ORAL
  Filled 2019-10-19 (×13): qty 1

## 2019-10-19 NOTE — Progress Notes (Signed)
Adult Psychoeducational Group Note  Date:  10/19/2019 Time:  8:33 PM  Group Topic/Focus:  Wrap-Up Group:   The focus of this group is to help patients review their daily goal of treatment and discuss progress on daily workbooks.  Participation Level:  Minimal  Participation Quality:  Appropriate  Affect:  Appropriate  Cognitive:  Appropriate  Insight: Appropriate  Engagement in Group:  Limited  Modes of Intervention:  Discussion  Additional Comments:  Pt participated during group at a minimal. Pt states that she feels she is improving . Her appetite and sleep are good. She had no goal for today but we agreed to aim for finding one positive thing about the day as tomorrows goal. Pt does not endorse SI/ HI and has agreed to notify staff if anything was to change.  Veronda Prude 10/19/2019, 8:33 PM

## 2019-10-19 NOTE — BHH Counselor (Signed)
CSW attempted to reach [name] from [facility] to provide treatment updates and discuss potential discharge plans. CSW left a voicemail with callback information.  [name], MSW, LCSW-A Clinical Social Worker [facility]

## 2019-10-19 NOTE — Progress Notes (Signed)
DAR NOTE: Patient presents with calm affect and pleasant  mood.  Denies pain, auditory and visual hallucinations.  Maintained on routine checks. Support and encouragement offered as needed.  Attended group and participated.  Will continue to monitor.

## 2019-10-19 NOTE — Progress Notes (Signed)
Recreation Therapy Notes  Date: 4.6.21 Time: 1000 Location: 500 Hall Dayroom  Group Topic: Wellness  Goal Area(s) Addresses:  Patient will define components of whole wellness. Patient will verbalize benefit of whole wellness.  Behavioral Response: Engaged  Intervention: Music  Activity:  Exercise.  LRT led group in a series of stretches.  Patients then took turns leading group in exercises of their choosing.  Patients were encouraged to take breaks when needed and drink water as needed.      Education: Wellness, Dentist.   Education Outcome: Acknowledges education/In group clarification offered/Needs additional education.   Clinical Observations/Feedback:  Pt was flat but would smile at times.  Pt never came up with exercises to lead the group in but was able to complete exercises presented by peers.  Pt didn't offer much during processing.        [name], LRT/CTRS     Ria Comment, Kaylianna Detert A 10/19/2019 11:12 AM

## 2019-10-19 NOTE — Progress Notes (Signed)
   10/19/19 1000  Psych Admission Type (Psych Patients Only)  Admission Status Voluntary  Psychosocial Assessment  Patient Complaints None  Eye Contact Brief  Facial Expression Flat  Affect Flat  Speech Soft  Interaction Guarded;No initiation  Motor Activity Other (Comment) (steady gait)  Appearance/Hygiene Improved  Behavior Characteristics Appropriate to situation  Mood Anxious  Thought Process  Coherency WDL  Content WDL  Delusions WDL  Perception WDL  Hallucination None reported or observed  Judgment Impaired  Confusion None  Danger to Self  Current suicidal ideation? Denies  Self-Injurious Behavior No self-injurious ideation or behavior indicators observed or expressed   Agreement Not to Harm Self Yes  Description of Agreement verbal  Danger to Others  Danger to Others None reported or observed

## 2019-10-19 NOTE — Progress Notes (Signed)
   10/19/19 2032  Psych Admission Type (Psych Patients Only)  Admission Status Voluntary  Psychosocial Assessment  Patient Complaints None  Eye Contact Brief  Facial Expression Sad  Affect Sad;Flat  Speech Soft  Interaction Guarded;No initiation  Motor Activity Other (Comment) (WNL)  Appearance/Hygiene Unremarkable  Behavior Characteristics Cooperative;Appropriate to situation  Mood Anxious;Sad  Thought Process  Coherency WDL  Content WDL  Delusions WDL  Perception WDL  Hallucination None reported or observed  Judgment Impaired  Confusion None  Danger to Self  Current suicidal ideation? Denies  Danger to Others  Danger to Others None reported or observed

## 2019-10-19 NOTE — Progress Notes (Signed)
[facility] MD Progress Note  10/19/2019 10:35 AM Julia [name]  MRN:  [id number] Subjective:   Patient seen in rounds she is a little guarded at first but she opens up towards the end of the interview acknowledging continued fears of voices "in her head" and some type of thought insertion but she states it is diminished in intensity and frequency but again still guarded as to make it difficult to discern her exact level of symptomatology but she is displaying no dangerous behaviors here she is cooperating without suicidal thoughts or homicidal thoughts. Principal Problem: Depressed mood Diagnosis: Principal Problem:   Depressed mood Active Problems:   MDD (major depressive disorder), recurrent, severe, with psychosis ([place])   MDD (major depressive disorder)  Total Time spent with patient: 20 minutes  Past Psychiatric History: Treatment resistant psychosis  Past Medical History:  Past Medical History:  Diagnosis Date  . Burning with urination 05/03/2015  . Contraceptive management 05/03/2015  . Depression   . Dysmenorrhea 12/30/2013  . Heroin addiction ([place])   . Menorrhagia 12/30/2013  . Menstrual extraction 12/30/2013  . Migraines   . Social anxiety disorder 09/25/2016  . Suicidal ideations   . Vaginal odor 05/03/2015    Past Surgical History:  Procedure Laterality Date  . NO PAST SURGERIES     Family History:  Family History  Problem Relation Age of Onset  . Depression Mother   . Hypertension Father   . Hyperlipidemia Father   . Cancer Paternal Grandmother        breast, uterine  . Cirrhosis Paternal Grandfather        due to alcohol   Family Psychiatric  History: No new data shared Social History:  Social History   Substance and Sexual Activity  Alcohol Use Yes   Comment: BAC was clear     Social History   Substance and Sexual Activity  Drug Use Yes  . Types: Marijuana, Oxycodone   Comment: reports oxycodone and marijuana    Social History   Socioeconomic History   . Marital status: Single    Spouse name: Not on file  . Number of children: Not on file  . Years of education: Not on file  . Highest education level: Not on file  Occupational History  . Occupation: Unemployed  Tobacco Use  . Smoking status: Current Every Day Smoker    Packs/day: 0.50    Types: Cigarettes  . Smokeless tobacco: Never Used  . Tobacco comment: Not ready to quit  Substance and Sexual Activity  . Alcohol use: Yes    Comment: BAC was clear  . Drug use: Yes    Types: Marijuana, Oxycodone    Comment: reports oxycodone and marijuana  . Sexual activity: Yes    Birth control/protection: Implant  Other Topics Concern  . Not on file  Social History Narrative   06/23/2019:  Pt stated that she is homeless, that she is a high school graduate, and that she is unemployed and not followed by any outpatient provider.      Lives with Dad. Mom has LGD, lives in [place]. 11th grader. Dog.   Social Determinants of Health   Financial Resource Strain:   . Difficulty of Paying Living Expenses:   Food Insecurity:   . Worried About Charity fundraiser in the Last Year:   . Arboriculturist in the Last Year:   Transportation Needs:   . Film/video editor (Medical):   [name] Lack of Transportation (Non-Medical):  Physical Activity:   . Days of Exercise per Week:   . Minutes of Exercise per Session:   Stress:   . Feeling of Stress :   Social Connections:   . Frequency of Communication with Friends and Family:   . Frequency of Social Gatherings with Friends and Family:   . Attends Religious Services:   . Active Member of Clubs or Organizations:   . Attends Archivist Meetings:   [name] Marital Status:    Additional Social History:    Pain Medications: Please see MAR Prescriptions: Please see MAR, prozac and cogentin per pt Over the Counter: Please see MAR History of alcohol / drug use?: Yes Longest period of sobriety (when/how long): Unknown Negative Consequences of Use:  Legal Name of Substance 1: alcohol 1 - Frequency: UTA 1 - Duration: UTA- pt denies 1 - Last Use / Amount: UTA                  Sleep: Fair  Appetite:  Fair  Current Medications: Current Facility-Administered Medications  Medication Dose Route Frequency Provider Last Rate Last Admin  . acetaminophen (TYLENOL) tablet 650 mg  650 mg Oral Q6H PRN Emmaline Kluver, FNP      . alum & mag hydroxide-simeth (MAALOX/MYLANTA) 200-200-20 MG/5ML suspension 30 mL  30 mL Oral Q4H PRN Emmaline Kluver, FNP      . benztropine (COGENTIN) tablet 1 mg  1 mg Oral BID PRN Cobos, Myer Peer, MD      . FLUoxetine (PROZAC) capsule 40 mg  40 mg Oral Daily Cobos, Myer Peer, MD   40 mg at 10/19/19 0906  . gabapentin (NEURONTIN) capsule 100 mg  100 mg Oral TID Cobos, Myer Peer, MD   100 mg at 10/19/19 [id number]  . haloperidol (HALDOL) tablet 5 mg  5 mg Oral Q6H PRN Cobos, Myer Peer, MD       Or  . haloperidol lactate (HALDOL) injection 5 mg  5 mg Intramuscular Q6H PRN Cobos, Myer Peer, MD      . LORazepam (ATIVAN) tablet 2 mg  2 mg Oral Q6H PRN Cobos, Myer Peer, MD       Or  . LORazepam (ATIVAN) injection 2 mg  2 mg Intramuscular Q6H PRN Cobos, Fernando A, MD      . magnesium hydroxide (MILK OF MAGNESIA) suspension 30 mL  30 mL Oral Daily PRN Emmaline Kluver, FNP      . nicotine polacrilex (NICORETTE) gum 2 mg  2 mg Oral PRN Cobos, Myer Peer, MD   2 mg at 10/18/19 1950  . [START ON 10/20/2019] paliperidone (INVEGA) 24 hr tablet 9 mg  9 mg Oral Daily [name], MD        Lab Results: No results found for this or any previous visit (from the past 48 hour(s)).  Blood Alcohol level:  Lab Results  Component Value Date   ETH <10 10/16/2019   ETH <10 [id number]    Metabolic Disorder Labs: Lab Results  Component Value Date   HGBA1C 5.4 10/16/2019   MPG 108.28 10/16/2019   MPG 102.54 05/17/2019   Lab Results  Component Value Date   PROLACTIN 152.0 (H) 10/16/2019   PROLACTIN 31.9 (H) 02/17/2019   Lab  Results  Component Value Date   CHOL 138 10/16/2019   TRIG 60 10/16/2019   HDL 36 (L) 10/16/2019   CHOLHDL 3.8 10/16/2019   VLDL 12 10/16/2019   LDLCALC 90 10/16/2019   LDLCALC 96 05/17/2019  Physical Findings: AIMS: Facial and Oral Movements Muscles of Facial Expression: None, normal Lips and Perioral Area: None, normal Jaw: None, normal Tongue: None, normal,Extremity Movements Upper (arms, wrists, hands, fingers): None, normal Lower (legs, knees, ankles, toes): None, normal, Trunk Movements Neck, shoulders, hips: None, normal, Overall Severity Severity of abnormal movements (highest score from questions above): None, normal Incapacitation due to abnormal movements: None, normal Patient's awareness of abnormal movements (rate only patient's report): No Awareness, Dental Status Current problems with teeth and/or dentures?: No Does patient usually wear dentures?: No  CIWA:  CIWA-Ar Total: 1 COWS:  COWS Total Score: 2  Musculoskeletal: Strength & Muscle Tone: within normal limits Gait & Station: normal Patient leans: N/A  Psychiatric Specialty Exam: Physical Exam  Review of Systems  Blood pressure 106/74, pulse 82, temperature 98.1 F (36.7 C), temperature source Oral, resp. rate 16, height 5\' 3"  (1.6 m), weight 67.8 kg, SpO2 100 %.Body mass index is 26.48 kg/m.  General Appearance: Casual  Eye Contact:  Minimal  Speech:  Clear and Coherent and Slow  Volume:  Decreased  Mood:  Dysphoric  Affect:  Congruent  Thought Process:  Goal Directed  Orientation:  Full (Time, Place, and Person)  Thought Content:  Illogical, Delusions and Hallucinations: Auditory  Suicidal Thoughts:  No  Homicidal Thoughts:  No  Memory:  Immediate;   Fair Recent;   Fair Remote;   Fair  Judgement:  Fair  Insight:  Fair  Psychomotor Activity:  Normal  Concentration:  Concentration: Fair and Attention Span: Fair  Recall:  [employer] of Knowledge:  Fair  Language:  Fair  Akathisia:   Negative  Handed:  Right  AIMS (if indicated):     Assets:  Leisure Time Physical Health  ADL's:  Intact  Cognition:  WNL  Sleep:  Number of Hours: 6.75     Treatment Plan Summary: Daily contact with patient to assess and evaluate symptoms and progress in treatment and Medication management  Cognitive therapy reality based therapy we will escalate iloperidone to 9 mg at bedtime no change in precautions continue to engage in groups and individually  Jozsef Wescoat, MD 10/19/2019, 10:35 AM

## 2019-10-19 NOTE — BHH Counselor (Signed)
CSW received case with TOC Supervisor, Lurline Idol.  Per chart review, patient was recommended to be appointed a guardian while inpatient at [facility] earlier this year in February 2021. The initial report and documentation was received by [facility] APS and was supposed to be transferred to [place], but it appears this transfer was not completed.  Per recommendation of TOC Supervisor, CSW completed a new APS report and recommendation for guardianship evaluation with [facility] APS Social Worker, [employer].   [name], MSW, LCSW-A Clinical Social Worker [facility]

## 2019-10-20 DIAGNOSIS — R4589 Other symptoms and signs involving emotional state: Secondary | ICD-10-CM | POA: Diagnosis not present

## 2019-10-20 NOTE — Progress Notes (Signed)
Recreation Therapy Notes  Date: 4.7.21 Time: 1000 Location: 500   Group Topic: Leisure Tax inspector) Addresses:  Patient will identify positive leisure activities.  Patient will identify one positive benefit of participation in leisure activities.   Intervention: Leisure Group Game  Activity: LRT and patients played some rounds of Dominoes.  Each participant was to get seven dominoes.  The first person to pick their dominoes got the first turn.  One each turn, the player was to match up their piece to one of the pieces that was already down.  The person to who got rid of all their pieces first, wins the game.  Education:  Leisure Education, Dentist  Education Outcome: Acknowledges education/In group clarification offered/Needs additional education  Clinical Observations/Feedback: Pt did not attend group session.     [name], LRT/CTRS         [name] A 10/20/2019 11:28 AM

## 2019-10-20 NOTE — Progress Notes (Signed)
Supervisor spoke with [name] at [place] team.  She reports that they have gotten notice that pt will at least receive survivor benefits from social security.  Not sure on mental health benefits yet.  She had not heard anything at all about APS report and was not aware that one had been filed.  Supervisor updated her that we again made report yesterday that pt needs legal guardian.  [name] is going to [employer] about possible placement and see what they would accept for ID.  She does not have any placement options even if the funding issue could be worked out.  [name] will call back after speaking to [employer].  Supervisor told her that it does appear that pt will be ready for discharge potentially by the end of the week. [name], MSW, LCSW Advanced Care Supervisor 10/20/2019 11:43 AM

## 2019-10-20 NOTE — Progress Notes (Signed)
[facility] MD Progress Note  10/20/2019 9:49 AM Julia [name]  MRN:  [id number] Subjective:    Patient reports no current auditory or visual hallucinations still somewhat guarded.  No thoughts of harming self or others as of last eval.  No EPS or TD.  Escalation of Invega discussed Principal Problem: Depressed mood Diagnosis: Principal Problem:   Depressed mood Active Problems:   MDD (major depressive disorder), recurrent, severe, with psychosis ([place])   MDD (major depressive disorder)  Total Time spent with patient: 20 minutes  Past Psychiatric History: see eval  Past Medical History:  Past Medical History:  Diagnosis Date  . Burning with urination 05/03/2015  . Contraceptive management 05/03/2015  . Depression   . Dysmenorrhea 12/30/2013  . Heroin addiction ([place])   . Menorrhagia 12/30/2013  . Menstrual extraction 12/30/2013  . Migraines   . Social anxiety disorder 09/25/2016  . Suicidal ideations   . Vaginal odor 05/03/2015    Past Surgical History:  Procedure Laterality Date  . NO PAST SURGERIES     Family History:  Family History  Problem Relation Age of Onset  . Depression Mother   . Hypertension Father   . Hyperlipidemia Father   . Cancer Paternal Grandmother        breast, uterine  . Cirrhosis Paternal Grandfather        due to alcohol   Family Psychiatric  History: see eval Social History:  Social History   Substance and Sexual Activity  Alcohol Use Yes   Comment: BAC was clear     Social History   Substance and Sexual Activity  Drug Use Yes  . Types: Marijuana, Oxycodone   Comment: reports oxycodone and marijuana    Social History   Socioeconomic History  . Marital status: Single    Spouse name: Not on file  . Number of children: Not on file  . Years of education: Not on file  . Highest education level: Not on file  Occupational History  . Occupation: Unemployed  Tobacco Use  . Smoking status: Current Every Day Smoker    Packs/day: 0.50    Types:  Cigarettes  . Smokeless tobacco: Never Used  . Tobacco comment: Not ready to quit  Substance and Sexual Activity  . Alcohol use: Yes    Comment: BAC was clear  . Drug use: Yes    Types: Marijuana, Oxycodone    Comment: reports oxycodone and marijuana  . Sexual activity: Yes    Birth control/protection: Implant  Other Topics Concern  . Not on file  Social History Narrative   06/23/2019:  Pt stated that she is homeless, that she is a high school graduate, and that she is unemployed and not followed by any outpatient provider.      Lives with Dad. Mom has LGD, lives in [place]. 11th grader. Dog.   Social Determinants of Health   Financial Resource Strain:   . Difficulty of Paying Living Expenses:   Food Insecurity:   . Worried About Charity fundraiser in the Last Year:   . Arboriculturist in the Last Year:   Transportation Needs:   . Film/video editor (Medical):   [name] Lack of Transportation (Non-Medical):   Physical Activity:   . Days of Exercise per Week:   . Minutes of Exercise per Session:   Stress:   . Feeling of Stress :   Social Connections:   . Frequency of Communication with Friends and Family:   . Frequency  of Social Gatherings with Friends and Family:   . Attends Religious Services:   . Active Member of Clubs or Organizations:   . Attends Archivist Meetings:   [name] Marital Status:    Additional Social History:    Pain Medications: Please see MAR Prescriptions: Please see MAR, prozac and cogentin per pt Over the Counter: Please see MAR History of alcohol / drug use?: Yes Longest period of sobriety (when/how long): Unknown Negative Consequences of Use: Legal Name of Substance 1: alcohol 1 - Frequency: UTA 1 - Duration: UTA- pt denies 1 - Last Use / Amount: UTA                  Sleep: Good  Appetite:  Fair  Current Medications: Current Facility-Administered Medications  Medication Dose Route Frequency Provider Last Rate Last Admin  .  acetaminophen (TYLENOL) tablet 650 mg  650 mg Oral Q6H PRN Emmaline Kluver, FNP      . alum & mag hydroxide-simeth (MAALOX/MYLANTA) 200-200-20 MG/5ML suspension 30 mL  30 mL Oral Q4H PRN Emmaline Kluver, FNP      . benztropine (COGENTIN) tablet 1 mg  1 mg Oral BID PRN Cobos, Myer Peer, MD      . FLUoxetine (PROZAC) capsule 40 mg  40 mg Oral Daily Cobos, Myer Peer, MD   40 mg at 10/20/19 0901  . gabapentin (NEURONTIN) capsule 100 mg  100 mg Oral TID Cobos, Myer Peer, MD   100 mg at 10/20/19 0901  . haloperidol (HALDOL) tablet 5 mg  5 mg Oral Q6H PRN Cobos, Myer Peer, MD       Or  . haloperidol lactate (HALDOL) injection 5 mg  5 mg Intramuscular Q6H PRN Cobos, Myer Peer, MD      . LORazepam (ATIVAN) tablet 2 mg  2 mg Oral Q6H PRN Cobos, Myer Peer, MD       Or  . LORazepam (ATIVAN) injection 2 mg  2 mg Intramuscular Q6H PRN Cobos, Fernando A, MD      . magnesium hydroxide (MILK OF MAGNESIA) suspension 30 mL  30 mL Oral Daily PRN Emmaline Kluver, FNP      . nicotine polacrilex (NICORETTE) gum 2 mg  2 mg Oral PRN Cobos, Myer Peer, MD   2 mg at 10/18/19 1950  . paliperidone (INVEGA) 24 hr tablet 9 mg  9 mg Oral Daily [name], MD   9 mg at 10/20/19 0901  . traZODone (DESYREL) tablet 50 mg  50 mg Oral QHS PRN [name], NP        Lab Results: No results found for this or any previous visit (from the past 48 hour(s)).  Blood Alcohol level:  Lab Results  Component Value Date   ETH <10 10/16/2019   ETH <10 123456    Metabolic Disorder Labs: Lab Results  Component Value Date   HGBA1C 5.4 10/16/2019   MPG 108.28 10/16/2019   MPG 102.54 05/17/2019   Lab Results  Component Value Date   PROLACTIN 152.0 (H) 10/16/2019   PROLACTIN 31.9 (H) 02/17/2019   Lab Results  Component Value Date   CHOL 138 10/16/2019   TRIG 60 10/16/2019   HDL 36 (L) 10/16/2019   CHOLHDL 3.8 10/16/2019   VLDL 12 10/16/2019   LDLCALC 90 10/16/2019   LDLCALC 96 05/17/2019    Physical Findings: AIMS:  Facial and Oral Movements Muscles of Facial Expression: None, normal Lips and Perioral Area: None, normal Jaw: None, normal  Tongue: None, normal,Extremity Movements Upper (arms, wrists, hands, fingers): None, normal Lower (legs, knees, ankles, toes): None, normal, Trunk Movements Neck, shoulders, hips: None, normal, Overall Severity Severity of abnormal movements (highest score from questions above): None, normal Incapacitation due to abnormal movements: None, normal Patient's awareness of abnormal movements (rate only patient's report): No Awareness, Dental Status Current problems with teeth and/or dentures?: No Does patient usually wear dentures?: No  CIWA:  CIWA-Ar Total: 1 COWS:  COWS Total Score: 2  Musculoskeletal: Strength & Muscle Tone: within normal limits Gait & Station: normal Patient leans: N/A  Psychiatric Specialty Exam: Physical Exam  Review of Systems  Blood pressure 106/74, pulse 82, temperature 98.1 F (36.7 C), temperature source Oral, resp. rate 16, height 5\' 3"  (1.6 m), weight 67.8 kg, SpO2 100 %.Body mass index is 26.48 kg/m.  General Appearance: Casual and Disheveled  Eye Contact:  Minimal  Speech:  Clear and Coherent and Slow  Volume:  Decreased  Mood:  Dysphoric  Affect:  Appropriate and Blunt  Thought Process:  Goal Directed  Orientation:  Full (Time, Place, and Person)  Thought Content:  denies hallucinations  Suicidal Thoughts:  No  Homicidal Thoughts:  No  Memory:  Immediate;   Fair Recent;   Fair Remote;   Fair  Judgement:  Fair  Insight:  Fair  Psychomotor Activity:  Normal  Concentration:  Concentration: Fair and Attention Span: Fair  Recall:  [employer] of Knowledge:  Fair  Language:  Fair  Akathisia:  Negative  Handed:  Right  AIMS (if indicated):     Assets:  Physical Health Resilience Social Support  ADL's:  Intact  Cognition:  WNL  Sleep:  Number of Hours: 6.25     Treatment Plan Summary: Daily contact with patient to  assess and evaluate symptoms and progress in treatment and Medication management continue paliperidone at higher dose continue to monitor for safety no change in precautions awaiting placement options  Julia Kiene, MD 10/20/2019, 9:49 AM

## 2019-10-20 NOTE — Progress Notes (Signed)
   10/20/19 2000  Psych Admission Type (Psych Patients Only)  Admission Status Voluntary  Psychosocial Assessment  Patient Complaints None  Eye Contact Fair  Facial Expression Sad  Affect Sad;Flat  Speech Soft  Interaction Guarded;Isolative  Motor Activity Other (Comment) (WNL)  Appearance/Hygiene Unremarkable  Behavior Characteristics Anxious;Guarded;Cooperative  Mood Depressed;Sad  Thought Process  Coherency WDL  Content WDL  Delusions None reported or observed  Perception WDL  Hallucination None reported or observed  Judgment Impaired  Confusion None  Danger to Self  Current suicidal ideation? Denies  Danger to Others  Danger to Others None reported or observed

## 2019-10-20 NOTE — Progress Notes (Signed)
PT is alert and oriented to person, place, time and situation. Pt denies suicidal and homicidal ideation, denies hallucinations, denies feelings of depression and anxiety. Pt is calm, cooperative, affect is blunted, pt isolates in her room for most of the morning, later noted to be visible in the hallways, pacing at times, sitting watching tv in the dayroom at other times. Pt is hypoverbal, answers shift assessment questions very briefly. Pt showered, is out for meds and meals. No distress noted or reported. Will continue to monitor pt per Q15 minute face checks and monitor for safety and progress.

## 2019-10-20 NOTE — Progress Notes (Signed)
Adult Psychoeducational Group Note  Date:  10/20/2019 Time:  9:29 PM  Group Topic/Focus:  Wrap-Up Group:   The focus of this group is to help patients review their daily goal of treatment and discuss progress on daily workbooks.  Participation Level:  Minimal  Participation Quality:  Appropriate  Affect:  Appropriate  Cognitive:  Appropriate  Insight: Improving  Engagement in Group:  Engaged  Modes of Intervention:  Education and Support  Additional Comments:  Patient attended and participated in group tonight. She reports having a good day. She eat, slept and watch television. She spoke with her Doctor, spoke with the student nurses, spoke with her boyfriend and her dad. She attended her groups and went for meals.   Salley Scarlet [facility] 10/20/2019, 9:29 PM

## 2019-10-20 NOTE — BHH Group Notes (Signed)

## 2019-10-21 DIAGNOSIS — R4589 Other symptoms and signs involving emotional state: Secondary | ICD-10-CM | POA: Diagnosis not present

## 2019-10-21 NOTE — Progress Notes (Signed)
Supervisor spoke to Renaldo Fiddler, APS supervisor at [employer].  She shows no records of pt in her system and said supervisor would need to speak to Barrie Lyme, who is Magazine features editor.  Supervisor noted that during most recent [facility] hospitalization, APS report was declined by [facility] DSS but we were told they were referring pt for guardianship and documentation was sent to DSS to support this.   Supervisor also spoke with [name] at [facility] who is still attempting to get pt accepted to [facility]. [name], MSW, LCSW Advanced Care Supervisor 10/21/2019 1:03 PM

## 2019-10-21 NOTE — BHH Counselor (Signed)
CSW contacted [name] at [facility] [phone]). Macky Lower is familiar with patient from her last admission at [facility].   [name] currently has an available bed for non-specialized funding, patient has Medicaid but not SSDI/SSI. This would qualify patient for this bed.  Macky Lower states she is willing to accept patient on one condition: patient must be enrolled in a Psychosocial Rehabilitation day program [facility]).  CSW will follow up with patient's ACT Team and [facility] Supervisor to assist in a referral for PSR, or other options for facilitating this placement.  [name] requested the following documents to be emailed ([email or web address]) - New co-signed FL2 - Medication List - Demographic information - H&P  [name], MSW, LCSW-A Clinical Social Worker [facility]

## 2019-10-21 NOTE — Progress Notes (Signed)
   10/21/19 1100  Vital Signs  Temp 97.6 F (36.4 C)  Temp Source Oral  Pulse Rate (!) 149  Pulse Rate Source Dinamap  Resp 16  BP 96/63  BP Location Right Arm  BP Method Automatic  Oxygen Therapy  SpO2 98 %  D: Patient Presents with appropriate mood and affect.  Patient was calm during med pass and took medicine without incident.  Pt was isolative in room. Patient refused morning vital signs. Vitals at 1100 showed a pulse rate of 149 by dinamap and a BP of  96/63. A left radial pulse was 122.  Doctor was notified. A:  Patient took scheduled medicine.  Support and encouragement provided Routine safety checks conducted every 15 minutes. Patient  Informed to notify staff with any concerns.  Safety maintained.

## 2019-10-21 NOTE — Progress Notes (Signed)
   10/21/19 2133  Psych Admission Type (Psych Patients Only)  Admission Status Voluntary  Psychosocial Assessment  Patient Complaints None  Eye Contact Fair  Facial Expression Sad  Affect Sad;Flat  Speech Soft  Interaction Guarded;Isolative  Motor Activity Other (Comment) (WNL)  Appearance/Hygiene Unremarkable  Behavior Characteristics Cooperative;Anxious  Mood Depressed;Anxious  Thought Process  Coherency WDL  Content WDL  Delusions None reported or observed  Perception WDL  Hallucination None reported or observed  Judgment Poor  Confusion None  Danger to Self  Current suicidal ideation? Denies  Danger to Others  Danger to Others None reported or observed   Pt seen in dayroom. Pt takes medication without incident. Isolative. Looking forward to when she can be discharged.

## 2019-10-21 NOTE — BHH Counselor (Signed)
Julia [name], from [place] communicated to Ricardo, Lurline Idol that the ACT Team is working on a shelter placement in [place] for patient. Patient still does not have photo ID, which ACTT is working to address.  [name], MSW, LCSW-A Clinical Social Worker [facility] Adult [facility]

## 2019-10-21 NOTE — Progress Notes (Signed)
Recreation Therapy Notes  Date: 4.8.21 Time: 0955 Location: 500 Hall Dayroom  Group Topic: Self-Esteem  Goal Area(s) Addresses:  Patient will successfully identify positive attributes about themselves.  Patient will successfully identify benefit of improved self-esteem.   Intervention: Magazines, Architect paper, scissors, glue sticks, music  Activity: Collage About Me.  Patients were to use supplies provided to create a collage of things that represent them.  Patients were to show things they like, things they want to accomplish, things that are important to them, etc.  Education:  Self-Esteem, Discharge Planning.   Education Outcome: Acknowledges education/In group clarification offered/Needs additional education  Clinical Observations/Feedback:  Pt did not attend group.     [name], LRT/CTRS         Ria Comment, Antonia Jicha A 10/21/2019 11:02 AM

## 2019-10-21 NOTE — Progress Notes (Signed)
[facility] MD Progress Note  10/21/2019 8:07 AM Julia [name]  MRN:  [id number] Subjective:   This is the latest of multiple psychiatric admissions for this 19 year old patient with treatment resistant schizophrenia, disruptions at her group home setting, and persistent delusional believes on presentation.  She has responded well to the escalation in paliperidone/oral dosing-she is currently in bed and seen on rounds with students  Patient resting well. She is not having any visual or auditory hallucinations. Patient has flat affect.  She has had no thought of harming herself since her admission. No thoughts of harming others. Patient took all her meds as scheduled. No EPS or TD.   Principal Problem: Depressed mood Diagnosis: Principal Problem:   Depressed mood Active Problems:   MDD (major depressive disorder), recurrent, severe, with psychosis ([place])   MDD (major depressive disorder)  Total Time spent with patient: 20 minutes  Past Psychiatric History: see eval  Past Medical History:  Past Medical History:  Diagnosis Date  . Burning with urination 05/03/2015  . Contraceptive management 05/03/2015  . Depression   . Dysmenorrhea 12/30/2013  . Heroin addiction ([place])   . Menorrhagia 12/30/2013  . Menstrual extraction 12/30/2013  . Migraines   . Social anxiety disorder 09/25/2016  . Suicidal ideations   . Vaginal odor 05/03/2015    Past Surgical History:  Procedure Laterality Date  . NO PAST SURGERIES     Family History:  Family History  Problem Relation Age of Onset  . Depression Mother   . Hypertension Father   . Hyperlipidemia Father   . Cancer Paternal Grandmother        breast, uterine  . Cirrhosis Paternal Grandfather        due to alcohol   Family Psychiatric  History: see eval Social History:  Social History   Substance and Sexual Activity  Alcohol Use Yes   Comment: BAC was clear     Social History   Substance and Sexual Activity  Drug Use Yes  . Types:  Marijuana, Oxycodone   Comment: reports oxycodone and marijuana    Social History   Socioeconomic History  . Marital status: Single    Spouse name: Not on file  . Number of children: Not on file  . Years of education: Not on file  . Highest education level: Not on file  Occupational History  . Occupation: Unemployed  Tobacco Use  . Smoking status: Current Every Day Smoker    Packs/day: 0.50    Types: Cigarettes  . Smokeless tobacco: Never Used  . Tobacco comment: Not ready to quit  Substance and Sexual Activity  . Alcohol use: Yes    Comment: BAC was clear  . Drug use: Yes    Types: Marijuana, Oxycodone    Comment: reports oxycodone and marijuana  . Sexual activity: Yes    Birth control/protection: Implant  Other Topics Concern  . Not on file  Social History Narrative   06/23/2019:  Pt stated that she is homeless, that she is a high school graduate, and that she is unemployed and not followed by any outpatient provider.      Lives with Dad. Mom has LGD, lives in [place]. 11th grader. Dog.   Social Determinants of Health   Financial Resource Strain:   . Difficulty of Paying Living Expenses:   Food Insecurity:   . Worried About Charity fundraiser in the Last Year:   . Arboriculturist in the Last Year:   Transportation Needs:   .  Lack of Transportation (Medical):   [name] Lack of Transportation (Non-Medical):   Physical Activity:   . Days of Exercise per Week:   . Minutes of Exercise per Session:   Stress:   . Feeling of Stress :   Social Connections:   . Frequency of Communication with Friends and Family:   . Frequency of Social Gatherings with Friends and Family:   . Attends Religious Services:   . Active Member of Clubs or Organizations:   . Attends Archivist Meetings:   [name] Marital Status:    Additional Social History:    Pain Medications: Please see MAR Prescriptions: Please see MAR, prozac and cogentin per pt Over the Counter: Please see MAR History of  alcohol / drug use?: Yes Longest period of sobriety (when/how long): Unknown Negative Consequences of Use: Legal Name of Substance 1: alcohol 1 - Frequency: UTA 1 - Duration: UTA- pt denies 1 - Last Use / Amount: UTA                  Sleep: Good  Appetite:  Fair  Current Medications: Current Facility-Administered Medications  Medication Dose Route Frequency Provider Last Rate Last Admin  . acetaminophen (TYLENOL) tablet 650 mg  650 mg Oral Q6H PRN Emmaline Kluver, FNP      . alum & mag hydroxide-simeth (MAALOX/MYLANTA) 200-200-20 MG/5ML suspension 30 mL  30 mL Oral Q4H PRN Emmaline Kluver, FNP      . benztropine (COGENTIN) tablet 1 mg  1 mg Oral BID PRN Cobos, Myer Peer, MD      . FLUoxetine (PROZAC) capsule 40 mg  40 mg Oral Daily Cobos, Myer Peer, MD   40 mg at 10/20/19 0901  . gabapentin (NEURONTIN) capsule 100 mg  100 mg Oral TID Cobos, Myer Peer, MD   100 mg at 10/20/19 1654  . haloperidol (HALDOL) tablet 5 mg  5 mg Oral Q6H PRN Cobos, Myer Peer, MD       Or  . haloperidol lactate (HALDOL) injection 5 mg  5 mg Intramuscular Q6H PRN Cobos, Myer Peer, MD      . LORazepam (ATIVAN) tablet 2 mg  2 mg Oral Q6H PRN Cobos, Myer Peer, MD       Or  . LORazepam (ATIVAN) injection 2 mg  2 mg Intramuscular Q6H PRN Cobos, Fernando A, MD      . magnesium hydroxide (MILK OF MAGNESIA) suspension 30 mL  30 mL Oral Daily PRN Emmaline Kluver, FNP      . nicotine polacrilex (NICORETTE) gum 2 mg  2 mg Oral PRN Cobos, Myer Peer, MD   2 mg at 10/20/19 2039  . paliperidone (INVEGA) 24 hr tablet 9 mg  9 mg Oral Daily [name], MD   9 mg at 10/20/19 0901  . traZODone (DESYREL) tablet 50 mg  50 mg Oral QHS PRN [name], NP   50 mg at 10/20/19 2038    Lab Results: No results found for this or any previous visit (from the past 48 hour(s)).  Blood Alcohol level:  Lab Results  Component Value Date   [facility] <10 10/16/2019   ETH <10 123456    Metabolic Disorder Labs: Lab Results   Component Value Date   HGBA1C 5.4 10/16/2019   MPG 108.28 10/16/2019   MPG 102.54 05/17/2019   Lab Results  Component Value Date   PROLACTIN 152.0 (H) 10/16/2019   PROLACTIN 31.9 (H) 02/17/2019   Lab Results  Component Value  Date   CHOL 138 10/16/2019   TRIG 60 10/16/2019   HDL 36 (L) 10/16/2019   CHOLHDL 3.8 10/16/2019   VLDL 12 10/16/2019   LDLCALC 90 10/16/2019   LDLCALC 96 05/17/2019    Physical Findings: AIMS: Facial and Oral Movements Muscles of Facial Expression: None, normal Lips and Perioral Area: None, normal Jaw: None, normal Tongue: None, normal,Extremity Movements Upper (arms, wrists, hands, fingers): None, normal Lower (legs, knees, ankles, toes): None, normal, Trunk Movements Neck, shoulders, hips: None, normal, Overall Severity Severity of abnormal movements (highest score from questions above): None, normal Incapacitation due to abnormal movements: None, normal Patient's awareness of abnormal movements (rate only patient's report): No Awareness, Dental Status Current problems with teeth and/or dentures?: No Does patient usually wear dentures?: No  CIWA:  CIWA-Ar Total: 1 COWS:  COWS Total Score: 2  Musculoskeletal: Strength & Muscle Tone: within normal limits Gait & Station: normal Patient leans: N/A  Psychiatric Specialty Exam: Physical Exam  Review of Systems  Blood pressure 106/74, pulse 82, temperature 98.1 F (36.7 C), temperature source Oral, resp. rate 16, height 5\' 3"  (1.6 m), weight 67.8 kg, SpO2 100 %.Body mass index is 26.48 kg/m.  General Appearance: Casual and Disheveled  Eye Contact:  Minimal  Speech:  Clear and Coherent and Slow  Volume:  Decreased  Mood:  Dysphoric  Affect:  Appropriate and Blunt  Thought Process:  Goal Directed  Orientation:  Full (Time, Place, and Person)  Thought Content:  denies hallucinations  Suicidal Thoughts:  No  Homicidal Thoughts:  No  Memory:  Immediate;   Fair Recent;   Fair Remote;   Fair   Judgement:  Fair  Insight:  Fair  Psychomotor Activity:  Normal  Concentration:  Concentration: Fair and Attention Span: Fair  Recall:  [employer] of Knowledge:  Fair  Language:  Fair  Akathisia:  Negative  Handed:  Right  AIMS (if indicated):     Assets:  Physical Health Resilience Social Support  ADL's:  Intact  Cognition:  WNL  Sleep:  Number of Hours: 8     Treatment Plan Summary: Daily contact with patient to assess and evaluate symptoms and progress in treatment and Medication management continue paliperidone at higher dose continue to monitor for safety. No change in precautions. Plan to move patient to 300 floor while awaiting placement options for discharge.    Casimiro Needle, Medical Student 10/21/2019, 8:07 AM

## 2019-10-22 DIAGNOSIS — R4589 Other symptoms and signs involving emotional state: Secondary | ICD-10-CM | POA: Diagnosis not present

## 2019-10-22 NOTE — Progress Notes (Signed)
[facility] MD Progress Note  10/22/2019 10:25 AM Julia [name]  MRN:  [id number] Subjective:  This is the latest of multiple psychiatric admissions for this 19 year old patient with treatment resistant schizophrenia, disruptions at her group home setting, and persistent delusional believes on presentation.  She has responded well to the escalation in paliperidone/oral dosing-  Patient seen she is alert and oriented to person place situation time and date, she denies current auditory or visual hallucinations, she denies thoughts of harming self or others, she denies any particular side effects with the medication, she has no involuntary movements or stiffness on exam.  Generally contained redirectable and without acute positive symptoms Group home placement pending  Principal Problem: Depressed mood Diagnosis: Principal Problem:   Depressed mood Active Problems:   MDD (major depressive disorder), recurrent, severe, with psychosis ([place])   MDD (major depressive disorder)  Total Time spent with patient: 20 minutes  Past Psychiatric History: Generally treatment resistant and has had periods of impulsivity and volatility/periods of noncompliance/protracted hospital stays/and during her last hospitalization was actually given combination long-acting injectable therapy due to her treatment resistance  Past Medical History:  Past Medical History:  Diagnosis Date  . Burning with urination 05/03/2015  . Contraceptive management 05/03/2015  . Depression   . Dysmenorrhea 12/30/2013  . Heroin addiction ([place])   . Menorrhagia 12/30/2013  . Menstrual extraction 12/30/2013  . Migraines   . Social anxiety disorder 09/25/2016  . Suicidal ideations   . Vaginal odor 05/03/2015    Past Surgical History:  Procedure Laterality Date  . NO PAST SURGERIES     Family History:  Family History  Problem Relation Age of Onset  . Depression Mother   . Hypertension Father   . Hyperlipidemia Father   . Cancer Paternal  Grandmother        breast, uterine  . Cirrhosis Paternal Grandfather        due to alcohol   Family Psychiatric  History: see eval Social History:  Social History   Substance and Sexual Activity  Alcohol Use Yes   Comment: BAC was clear     Social History   Substance and Sexual Activity  Drug Use Yes  . Types: Marijuana, Oxycodone   Comment: reports oxycodone and marijuana    Social History   Socioeconomic History  . Marital status: Single    Spouse name: Not on file  . Number of children: Not on file  . Years of education: Not on file  . Highest education level: Not on file  Occupational History  . Occupation: Unemployed  Tobacco Use  . Smoking status: Current Every Day Smoker    Packs/day: 0.50    Types: Cigarettes  . Smokeless tobacco: Never Used  . Tobacco comment: Not ready to quit  Substance and Sexual Activity  . Alcohol use: Yes    Comment: BAC was clear  . Drug use: Yes    Types: Marijuana, Oxycodone    Comment: reports oxycodone and marijuana  . Sexual activity: Yes    Birth control/protection: Implant  Other Topics Concern  . Not on file  Social History Narrative   06/23/2019:  Pt stated that she is homeless, that she is a high school graduate, and that she is unemployed and not followed by any outpatient provider.      Lives with Dad. Mom has LGD, lives in [place]. 11th grader. Dog.   Social Determinants of Health   Financial Resource Strain:   . Difficulty of Paying Living  Expenses:   Food Insecurity:   . Worried About Charity fundraiser in the Last Year:   . Arboriculturist in the Last Year:   Transportation Needs:   . Film/video editor (Medical):   [name] Lack of Transportation (Non-Medical):   Physical Activity:   . Days of Exercise per Week:   . Minutes of Exercise per Session:   Stress:   . Feeling of Stress :   Social Connections:   . Frequency of Communication with Friends and Family:   . Frequency of Social Gatherings with Friends  and Family:   . Attends Religious Services:   . Active Member of Clubs or Organizations:   . Attends Archivist Meetings:   [name] Marital Status:    Additional Social History:    Pain Medications: Please see MAR Prescriptions: Please see MAR, prozac and cogentin per pt Over the Counter: Please see MAR History of alcohol / drug use?: Yes Longest period of sobriety (when/how long): Unknown Negative Consequences of Use: Legal Name of Substance 1: alcohol 1 - Frequency: UTA 1 - Duration: UTA- pt denies 1 - Last Use / Amount: UTA                  Sleep: Fair  Appetite:  Fair  Current Medications: Current Facility-Administered Medications  Medication Dose Route Frequency Provider Last Rate Last Admin  . acetaminophen (TYLENOL) tablet 650 mg  650 mg Oral Q6H PRN Emmaline Kluver, FNP      . alum & mag hydroxide-simeth (MAALOX/MYLANTA) 200-200-20 MG/5ML suspension 30 mL  30 mL Oral Q4H PRN Emmaline Kluver, FNP      . benztropine (COGENTIN) tablet 1 mg  1 mg Oral BID PRN Cobos, Myer Peer, MD      . FLUoxetine (PROZAC) capsule 40 mg  40 mg Oral Daily Cobos, Myer Peer, MD   40 mg at 10/22/19 0904  . gabapentin (NEURONTIN) capsule 100 mg  100 mg Oral TID Cobos, Myer Peer, MD   100 mg at 10/22/19 0904  . haloperidol (HALDOL) tablet 5 mg  5 mg Oral Q6H PRN Cobos, Myer Peer, MD       Or  . haloperidol lactate (HALDOL) injection 5 mg  5 mg Intramuscular Q6H PRN Cobos, Myer Peer, MD      . LORazepam (ATIVAN) tablet 2 mg  2 mg Oral Q6H PRN Cobos, Myer Peer, MD       Or  . LORazepam (ATIVAN) injection 2 mg  2 mg Intramuscular Q6H PRN Cobos, Fernando A, MD      . magnesium hydroxide (MILK OF MAGNESIA) suspension 30 mL  30 mL Oral Daily PRN Emmaline Kluver, FNP      . nicotine polacrilex (NICORETTE) gum 2 mg  2 mg Oral PRN Cobos, Myer Peer, MD   2 mg at 10/21/19 1945  . paliperidone (INVEGA) 24 hr tablet 9 mg  9 mg Oral Daily [name], MD   9 mg at 10/22/19 0905  . traZODone  (DESYREL) tablet 50 mg  50 mg Oral QHS PRN [name], NP   50 mg at 10/21/19 2034    Lab Results: No results found for this or any previous visit (from the past 48 hour(s)).  Blood Alcohol level:  Lab Results  Component Value Date   [facility] <10 10/16/2019   ETH <10 [id number]    Metabolic Disorder Labs: Lab Results  Component Value Date   HGBA1C 5.4 10/16/2019  MPG 108.28 10/16/2019   MPG 102.54 05/17/2019   Lab Results  Component Value Date   PROLACTIN 152.0 (H) 10/16/2019   PROLACTIN 31.9 (H) 02/17/2019   Lab Results  Component Value Date   CHOL 138 10/16/2019   TRIG 60 10/16/2019   HDL 36 (L) 10/16/2019   CHOLHDL 3.8 10/16/2019   VLDL 12 10/16/2019   LDLCALC 90 10/16/2019   LDLCALC 96 05/17/2019    Physical Findings: AIMS: Facial and Oral Movements Muscles of Facial Expression: None, normal Lips and Perioral Area: None, normal Jaw: None, normal Tongue: None, normal,Extremity Movements Upper (arms, wrists, hands, fingers): None, normal Lower (legs, knees, ankles, toes): None, normal, Trunk Movements Neck, shoulders, hips: None, normal, Overall Severity Severity of abnormal movements (highest score from questions above): None, normal Incapacitation due to abnormal movements: None, normal Patient's awareness of abnormal movements (rate only patient's report): No Awareness, Dental Status Current problems with teeth and/or dentures?: No Does patient usually wear dentures?: No  CIWA:  CIWA-Ar Total: 1 COWS:  COWS Total Score: 2  Musculoskeletal: Strength & Muscle Tone: within normal limits Gait & Station: normal Patient leans: N/A  Psychiatric Specialty Exam: Physical Exam  Review of Systems  Blood pressure 97/65, pulse (!) 105, temperature 98.6 F (37 C), temperature source Oral, resp. rate 18, height 5\' 3"  (1.6 m), weight 67.8 kg, SpO2 98 %.Body mass index is 26.48 kg/m.  General Appearance: Casual  Eye Contact:  Good  Speech:  Clear and Coherent   Volume:  Decreased  Mood:  Euthymic  Affect:  Blunt  Thought Process:  Goal Directed  Orientation:  Full (Time, Place, and Person)  Thought Content:  Some poverty of content with detailed questioning but no current positive symptoms are reported  Suicidal Thoughts:  No  Homicidal Thoughts:  No  Memory:  Immediate;   Fair Recent;   Fair Remote;   Fair  Judgement:  Fair  Insight:  Fair  Psychomotor Activity:  Normal  Concentration:  Concentration: Fair and Attention Span: Fair  Recall:  [employer] of Knowledge:  Fair  Language:  Fair  Akathisia:  Negative  Handed:  Right  AIMS (if indicated):     Assets:  Communication Skills Leisure Time Physical Health Resilience  ADL's:  Intact  Cognition:  WNL  Sleep:  Number of Hours: 8     Treatment Plan Summary: Daily contact with patient to assess and evaluate symptoms and progress in treatment and Medication management  Continue current antipsychotic therapy without change Continue current precautions without change Has probably achieved her baseline we are waiting placement Continue group therapies that involve med and illness education and reality based therapy individually  Naman Spychalski, MD 10/22/2019, 10:25 AM

## 2019-10-22 NOTE — Progress Notes (Signed)
Recreation Therapy Notes  Date: 4.9.21 Time: 1000 Location: 500 Hall Dayroom Group Topic: Communication, Team Building, Problem Solving  Goal Area(s) Addresses:  Patient will effectively work with peer towards shared goal.  Patient will identify skills used to make activity successful.  Patient will identify how skills used during activity can be used to reach post d/c goals.   Intervention: STEM Activity  Activity: Geophysicist/field seismologist. In teams patients were given 12 plastic drinking straws and a length of masking tape. Using the materials provided patients were asked to build a landing pad to catch a golf ball dropped from approximately 6 feet in the air.   Education: Education officer, community, Discharge Planning   Education Outcome: Acknowledges education/In group clarification offered/Needs additional education.   Clinical Observations/Feedback: Pt did not group session.    [name], LRT/CTRS         [name] A 10/22/2019 11:42 AM

## 2019-10-22 NOTE — Progress Notes (Signed)
  Pt says that she feels "pretty good" today. RN oberserved pt spending time in the dayroom and attending group sessions.  Pt using prn nicorette throughout the day.  No immediate concerns were identified at this time.  RN will continue to monitor and provide support as needed.    10/22/19 0915  Psych Admission Type (Psych Patients Only)  Admission Status Voluntary  Psychosocial Assessment  Patient Complaints None  Eye Contact Fair  Facial Expression Sad  Affect Sad;Flat  Speech Soft  Interaction Guarded;Isolative  Motor Activity Other (Comment) (WNL)  Appearance/Hygiene Unremarkable  Behavior Characteristics Cooperative;Anxious  Mood Depressed  Thought Process  Coherency WDL  Content WDL  Delusions None reported or observed  Perception WDL  Hallucination None reported or observed  Judgment Poor  Confusion None  Danger to Self  Current suicidal ideation? Denies  Danger to Others  Danger to Others None reported or observed

## 2019-10-22 NOTE — NC FL2 (Signed)
[place] LEVEL OF CARE SCREENING TOOL     IDENTIFICATION  Patient Name: Julia [name] Birthdate: 2001/02/21 Sex: female Admission Date (Current Location): 10/15/2019  [place] and [place] Number:  Kathleen Argue [id number] [place] and Address:  [facility] [place]. [facility] [place] [address] [place],  [address]      Provider Number: [id number]  Attending Physician Name and Address:  Jenne Campus, MD  Relative Name and Phone Number:  Cato Mulligan Mattingly Neimeyer [phone]    Current Level of Care: Hospital Recommended Level of Care: Other (Comment)(Group Home) Prior Approval Number:    Date Approved/Denied:   PASRR Number:    Discharge Plan: Other (Comment)(Group Home)    Current Diagnoses: Patient Active Problem List   Diagnosis Date Noted  . MDD (major depressive disorder) 10/15/2019  . Schizophrenia, paranoid ([place])   . Mood disorder with psychosis ([place]) 05/16/2019  . Cocaine dependence with cocaine-induced psychotic disorder with complication ([place])   . Psychosis ([place]) 05/15/2019  . MDD (major depressive disorder), recurrent, severe, with psychosis ([place]) 02/16/2019  . Bipolar 1 disorder, depressed, severe ([place]) 01/06/2018  . Polysubstance dependence including opioid type drug, continuous use ([place]) 01/06/2018  . Petechiae 10/09/2017  . Social anxiety disorder 09/25/2016  . MDD (major depressive disorder), recurrent episode ([place]) 09/24/2016  . Substance induced mood disorder ([place]) 04/29/2016  . Depressed mood 04/17/2016  . Polysubstance abuse ([place])   . Recurrent UTI (urinary tract infection) postcoital 08/21/2015  . Dysmenorrhea 12/30/2013    Orientation RESPIRATION BLADDER Height & Weight     Self, Time, Situation, Place  Normal Continent Weight: 67.8 kg Height:  5\' 3"  (160 cm)  BEHAVIORAL SYMPTOMS/MOOD NEUROLOGICAL BOWEL NUTRITION STATUS      Continent Diet(Regular)  AMBULATORY STATUS COMMUNICATION OF NEEDS Skin   Independent Verbally  Normal                       Personal Care Assistance Level of Assistance  Bathing, Feeding, Dressing, Total care Bathing Assistance: Independent Feeding assistance: Independent Dressing Assistance: Independent Total Care Assistance: Independent   Functional Limitations Info             SPECIAL CARE FACTORS FREQUENCY                       Contractures Contractures Info: Not present    Additional Factors Info  Code Status, Allergies Code Status Info: Full Allergies Info: Abilify, Zoloft           Current Medications (10/22/2019):  This is the current hospital active medication list Current Facility-Administered Medications  Medication Dose Route Frequency Provider Last Rate Last Admin  . acetaminophen (TYLENOL) tablet 650 mg  650 mg Oral Q6H PRN Emmaline Kluver, FNP      . alum & mag hydroxide-simeth (MAALOX/MYLANTA) 200-200-20 MG/5ML suspension 30 mL  30 mL Oral Q4H PRN Emmaline Kluver, FNP      . benztropine (COGENTIN) tablet 1 mg  1 mg Oral BID PRN Cobos, Myer Peer, MD      . FLUoxetine (PROZAC) capsule 40 mg  40 mg Oral Daily Cobos, Myer Peer, MD   40 mg at 10/22/19 0904  . gabapentin (NEURONTIN) capsule 100 mg  100 mg Oral TID Cobos, Myer Peer, MD   100 mg at 10/22/19 1239  . haloperidol (HALDOL) tablet 5 mg  5 mg Oral Q6H PRN Cobos, Myer Peer, MD       Or  .  haloperidol lactate (HALDOL) injection 5 mg  5 mg Intramuscular Q6H PRN Cobos, Myer Peer, MD      . LORazepam (ATIVAN) tablet 2 mg  2 mg Oral Q6H PRN Cobos, Myer Peer, MD       Or  . LORazepam (ATIVAN) injection 2 mg  2 mg Intramuscular Q6H PRN Cobos, Fernando A, MD      . magnesium hydroxide (MILK OF MAGNESIA) suspension 30 mL  30 mL Oral Daily PRN Emmaline Kluver, FNP      . nicotine polacrilex (NICORETTE) gum 2 mg  2 mg Oral PRN Cobos, Myer Peer, MD   2 mg at 10/22/19 1239  . paliperidone (INVEGA) 24 hr tablet 9 mg  9 mg Oral Daily [name], MD   9 mg at 10/22/19 0905  . traZODone (DESYREL)  tablet 50 mg  50 mg Oral QHS PRN [name], NP   50 mg at 10/21/19 2034     Discharge Medications: Please see discharge summary for a list of discharge medications.  Relevant Imaging Results:  Relevant Lab Results:   Additional Information SSN: [id number]  Joellen [place], [place]

## 2019-10-22 NOTE — Tx Team (Signed)
Interdisciplinary Treatment and Diagnostic Plan Update  10/22/2019 Time of Session: 8:45am  LECRESHA MANNERING MRN: [id number]  Principal Diagnosis: Depressed mood  Secondary Diagnoses: Principal Problem:   Depressed mood Active Problems:   MDD (major depressive disorder), recurrent, severe, with psychosis ([place])   MDD (major depressive disorder)   Current Medications:  Current Facility-Administered Medications  Medication Dose Route Frequency Provider Last Rate Last Admin  . acetaminophen (TYLENOL) tablet 650 mg  650 mg Oral Q6H PRN Emmaline Kluver, FNP      . alum & mag hydroxide-simeth (MAALOX/MYLANTA) 200-200-20 MG/5ML suspension 30 mL  30 mL Oral Q4H PRN Emmaline Kluver, FNP      . benztropine (COGENTIN) tablet 1 mg  1 mg Oral BID PRN Cobos, Myer Peer, MD      . FLUoxetine (PROZAC) capsule 40 mg  40 mg Oral Daily Cobos, Myer Peer, MD   40 mg at 10/22/19 0904  . gabapentin (NEURONTIN) capsule 100 mg  100 mg Oral TID Cobos, Myer Peer, MD   100 mg at 10/22/19 0904  . haloperidol (HALDOL) tablet 5 mg  5 mg Oral Q6H PRN Cobos, Myer Peer, MD       Or  . haloperidol lactate (HALDOL) injection 5 mg  5 mg Intramuscular Q6H PRN Cobos, Myer Peer, MD      . LORazepam (ATIVAN) tablet 2 mg  2 mg Oral Q6H PRN Cobos, Myer Peer, MD       Or  . LORazepam (ATIVAN) injection 2 mg  2 mg Intramuscular Q6H PRN Cobos, Fernando A, MD      . magnesium hydroxide (MILK OF MAGNESIA) suspension 30 mL  30 mL Oral Daily PRN Emmaline Kluver, FNP      . nicotine polacrilex (NICORETTE) gum 2 mg  2 mg Oral PRN Cobos, Myer Peer, MD   2 mg at 10/21/19 1945  . paliperidone (INVEGA) 24 hr tablet 9 mg  9 mg Oral Daily [name], MD   9 mg at 10/22/19 0905  . traZODone (DESYREL) tablet 50 mg  50 mg Oral QHS PRN [name], NP   50 mg at 10/21/19 2034   PTA Medications: Medications Prior to Admission  Medication Sig Dispense Refill Last Dose  . ibuprofen (ADVIL) 400 MG tablet Take 400 mg by mouth every 6 (six) hours  as needed for headache or mild pain.     . benztropine (COGENTIN) 1 MG tablet Take 1 tablet (1 mg total) by mouth 2 (two) times daily. (Patient not taking: Reported on 10/16/2019) 60 tablet 1 Not Taking at Unknown time  . FLUoxetine (PROZAC) 20 MG capsule Take 1 capsule (20 mg total) by mouth daily. (Patient not taking: Reported on 10/16/2019) 30 capsule 1 Not Taking at Unknown time  . gabapentin (NEURONTIN) 400 MG capsule Take 1 capsule (400 mg total) by mouth 3 (three) times daily. (Patient not taking: Reported on 10/16/2019) 90 capsule 1 Not Taking at Unknown time  . haloperidol decanoate (HALDOL DECANOATE) 100 MG/ML injection Inject 0.5 mLs (50 mg total) into the muscle every 30 (thirty) days. NEXT DOSE DUE AROUND September 15, 2019 (Patient not taking: Reported on 10/16/2019) 1 mL 1 Not Taking at Unknown time  . paliperidone (INVEGA SUSTENNA) 156 MG/ML SUSY injection Inject 1 mL (156 mg total) into the muscle every 30 (thirty) days. NEXT DOSE DUE AROUND September 15, 2019 (Patient not taking: Reported on 10/16/2019) 1.2 mL 1 Not Taking at Unknown time  . paliperidone (INVEGA) 3 MG  24 hr tablet Take 1 tablet (3 mg total) by mouth daily. (Patient not taking: Reported on 10/16/2019) 30 tablet 1 Not Taking at Unknown time  . traZODone (DESYREL) 50 MG tablet Take 1 tablet (50 mg total) by mouth at bedtime. (Patient not taking: Reported on 10/16/2019) 30 tablet 1 Not Taking at Unknown time    Patient Stressors:    Patient Strengths:    Treatment Modalities: Medication Management, Group therapy, Case management,  1 to 1 session with clinician, Psychoeducation, Recreational therapy.   Physician Treatment Plan for Primary Diagnosis: Depressed mood Long Term Goal(s): Improvement in symptoms so as ready for discharge Improvement in symptoms so as ready for discharge   Short Term Goals: Ability to identify changes in lifestyle to reduce recurrence of condition will improve Ability to verbalize feelings will improve Ability  to disclose and discuss suicidal ideas Ability to demonstrate self-control will improve Ability to identify and develop effective coping behaviors will improve Ability to maintain clinical measurements within normal limits will improve Compliance with prescribed medications will improve Ability to identify changes in lifestyle to reduce recurrence of condition will improve Ability to verbalize feelings will improve Ability to disclose and discuss suicidal ideas Ability to demonstrate self-control will improve Ability to identify and develop effective coping behaviors will improve Ability to maintain clinical measurements within normal limits will improve Compliance with prescribed medications will improve  Medication Management: Evaluate patient's response, side effects, and tolerance of medication regimen.  Therapeutic Interventions: 1 to 1 sessions, Unit Group sessions and Medication administration.  Evaluation of Outcomes: Progressing  Physician Treatment Plan for Secondary Diagnosis: Principal Problem:   Depressed mood Active Problems:   MDD (major depressive disorder), recurrent, severe, with psychosis ([place])   MDD (major depressive disorder)  Long Term Goal(s): Improvement in symptoms so as ready for discharge Improvement in symptoms so as ready for discharge   Short Term Goals: Ability to identify changes in lifestyle to reduce recurrence of condition will improve Ability to verbalize feelings will improve Ability to disclose and discuss suicidal ideas Ability to demonstrate self-control will improve Ability to identify and develop effective coping behaviors will improve Ability to maintain clinical measurements within normal limits will improve Compliance with prescribed medications will improve Ability to identify changes in lifestyle to reduce recurrence of condition will improve Ability to verbalize feelings will improve Ability to disclose and discuss suicidal  ideas Ability to demonstrate self-control will improve Ability to identify and develop effective coping behaviors will improve Ability to maintain clinical measurements within normal limits will improve Compliance with prescribed medications will improve     Medication Management: Evaluate patient's response, side effects, and tolerance of medication regimen.  Therapeutic Interventions: 1 to 1 sessions, Unit Group sessions and Medication administration.  Evaluation of Outcomes: Progressing   RN Treatment Plan for Primary Diagnosis: Depressed mood Long Term Goal(s): Knowledge of disease and therapeutic regimen to maintain health will improve  Short Term Goals: Ability to participate in decision making will improve, Ability to verbalize feelings will improve, Ability to disclose and discuss suicidal ideas, Ability to identify and develop effective coping behaviors will improve and Compliance with prescribed medications will improve  Medication Management: RN will administer medications as ordered by provider, will assess and evaluate patient's response and provide education to patient for prescribed medication. RN will report any adverse and/or side effects to prescribing provider.  Therapeutic Interventions: 1 on 1 counseling sessions, Psychoeducation, Medication administration, Evaluate responses to treatment, Monitor vital signs and CBGs  as ordered, Perform/monitor CIWA, COWS, AIMS and Fall Risk screenings as ordered, Perform wound care treatments as ordered.  Evaluation of Outcomes: Progressing   LCSW Treatment Plan for Primary Diagnosis: Depressed mood Long Term Goal(s): Safe transition to appropriate next level of care at discharge, Engage patient in therapeutic group addressing interpersonal concerns.  Short Term Goals: Engage patient in aftercare planning with referrals and resources and Increase skills for wellness and recovery  Therapeutic Interventions: Assess for all discharge  needs, 1 to 1 time with Social worker, Explore available resources and support systems, Assess for adequacy in community support network, Educate family and significant other(s) on suicide prevention, Complete Psychosocial Assessment, Interpersonal group therapy.  Evaluation of Outcomes: Progressing   Progress in Treatment: Attending groups: No. Participating in groups: No. Taking medication as prescribed: Yes. Toleration medication: Yes. Family/Significant other contact made: Yes, individual(s) contacted:  with pt's father  Patient understands diagnosis: Yes. Discussing patient identified problems/goals with staff: Yes. Medical problems stabilized or resolved: Yes. Denies suicidal/homicidal ideation: Yes. Issues/concerns per patient self-inventory: No. Other:   New problem(s) identified: Yes, Describe:  group home placement   New Short Term/Long Term Goal(s): Medication stabilization, elimination of SI thoughts, and development of a comprehensive mental wellness plan.   Patient Goals:    Discharge Plan or Barriers: CSW will continue to follow up for appropriate referrals and possible discharge planning  Reason for Continuation of Hospitalization: Anxiety Delusions  Depression Hallucinations Medication stabilization  Estimated Length of Stay: 3-5 days   Attendees: Patient:  10/22/2019   Physician: Dr. Jake Samples, MD 10/22/2019   Nursing: Gilberto Better., RN 10/22/2019   RN Care Manager: 10/22/2019   Social Worker: Lurline Idol, LCSW  10/22/2019   Recreational Therapist:  10/22/2019   Other: Ovidio Kin, MSW intern  10/22/2019   Other:  10/22/2019   Other: 10/22/2019      Scribe for Treatment Team: Billey Chang, Student-Social Work 10/22/2019 9:45 AM

## 2019-10-22 NOTE — Progress Notes (Signed)
D: Pt denies SI/HI/AV hallucinations. Patient has been in her room most of evening.  A: Pt was offered support and encouragement.  Q 15 minute checks were done for safety.  R: Pt has no complaints.Pt receptive to treatment and safety maintained on unit.

## 2019-10-22 NOTE — BHH Counselor (Addendum)
CSW attempted to reach [name] at [facility] [phone]) three times. The calls were not answered and the voicemail box is full.   CSW spoke with [name] yesterday (04/08) who is considering patient for group home placement, on the condition that patient is enrolled in psychosocial rehabilitation. CSW will assist in pursuing this referral, patient is agreeable.  Patient cannot participate in PSR and ACTT at the same time, patient would need outpatient medication management and therapy, most likely [facility] in [place].  CSW attempted to reach [facility] in [place], [place], which offers PSR [phone]). The call was not answered and CSW did not have an option to leave a voicemail.  CSW emailed referral documentation, as requested, to [place] for review.  There are no updates regarding patient's [facility] guardianship referral and APS report (completed on 04/06).  CSW following for disposition.  [name], MSW, LCSW-A Clinical Social Worker [facility]

## 2019-10-22 NOTE — BHH Group Notes (Signed)
LCSW Wellness Group Note   10/22/2019 11:00am  Type of Group and Topic: Psychoeducational Group:  Wellness  Participation Level:  Active  Description of Group  Wellness group introduces the topic and its focus on developing healthy habits across the spectrum and its relationship to a decrease in hospital admissions.  Six areas of wellness are discussed: physical, social spiritual, intellectual, occupational, and emotional.  Patients are asked to consider their current wellness habits and to identify areas of wellness where they are interested and able to focus on improvements.    Therapeutic Goals 1. Patients will understand components of wellness and how they can positively impact overall health.  2. Patients will identify areas of wellness where they have developed good habits. 3. Patients will identify areas of wellness where they would like to make improvements.    Summary of Patient Progress: Pt was quiet during group but did appear to pay attention, did answer CSW questions.  Pt identified financial and social as wellness areas that she needed to work on and physical and emotional as wellness areas that she felt like she was already doing well on.     Therapeutic Modalities: Cognitive Behavioral Therapy Psychoeducation    Joanne Chars, LCSW

## 2019-10-22 NOTE — Progress Notes (Signed)
Supervisor spoke with Julia [name] from [facility].  She cannot continue ACT services if pt is also attend PSR day program.  Julia [name] did get a final answer of no regarding admission to [facility] in [place].    Supervisor again made efforts to contact [facility] regarding progress on obtaining guardianship.  Supervisor left second message with Julia [name], supervisor and also left message for supervisor Julia [name] and program manager Julia [name].   [name], MSW, LCSW Advanced Care Supervisor 10/22/2019 1:59 PM

## 2019-10-23 DIAGNOSIS — R4589 Other symptoms and signs involving emotional state: Secondary | ICD-10-CM | POA: Diagnosis not present

## 2019-10-23 MED ORDER — SULFAMETHOXAZOLE-TRIMETHOPRIM 800-160 MG PO TABS
1.0000 | ORAL_TABLET | Freq: Two times a day (BID) | ORAL | Status: DC
  Administered 2019-10-23 – 2019-10-30 (×14): 1 via ORAL
  Filled 2019-10-23 (×19): qty 1

## 2019-10-23 NOTE — Progress Notes (Signed)
Patient has been up in he dayroom briefly tonight before requesting her medicine. She reported having had a good day and voiced no complaints. Safety maintained with 15 min checks.   10/23/19 2010  Psych Admission Type (Psych Patients Only)  Admission Status Voluntary  Psychosocial Assessment  Patient Complaints None  Eye Contact Fair  Facial Expression Flat  Affect Flat  Speech Soft  Interaction Guarded  Motor Activity Other (Comment) (WNL)  Appearance/Hygiene Unremarkable  Behavior Characteristics Cooperative;Appropriate to situation  Mood Pleasant  Thought Process  Coherency WDL  Content WDL  Delusions None reported or observed  Perception WDL  Hallucination None reported or observed  Judgment Poor  Confusion None  Danger to Self  Current suicidal ideation? Denies  Self-Injurious Behavior No self-injurious ideation or behavior indicators observed or expressed   Agreement Not to Harm Self Yes  Description of Agreement verbal  Danger to Others  Danger to Others None reported or observed

## 2019-10-23 NOTE — B[facility] Group Notes (Signed)
  BHH/BMU LCSW Group Therapy Note  Date/Time:  10/23/2019 11:15AM-12:00PM  Type of Therapy and Topic:  Group Therapy:  Feelings About Hospitalization  Participation Level:  None   Description of Group This process group involved patients discussing their feelings related to being hospitalized, as well as the benefits they see to being in the hospital.  These feelings and benefits were itemized.  The group then brainstormed specific ways in which they could seek those same benefits when they discharge and return home.  Therapeutic Goals 1. Patient will identify and describe positive and negative feelings related to hospitalization 2. Patient will verbalize benefits of hospitalization to themselves personally 3. Patients will brainstorm together ways they can obtain similar benefits in the outpatient setting, identify barriers to wellness and possible solutions  Summary of Patient Progress:  The patient was not present for introductory check-in discussion. Pt joined group at closing discussions regarding the abilities to obtain benefits received while hospitalized in outpatient settings. Pt did not provide any input throughout discussion.  Therapeutic Modalities Cognitive Behavioral Therapy Motivational Interviewing    Katherina Mires 10/23/2019  12:31 PM

## 2019-10-23 NOTE — Plan of Care (Signed)
Patient visible in the milieu, displaying a stable mood.  Problem: Education: Goal: Emotional status will improve Outcome: Progressing Goal: Mental status will improve Outcome: Progressing

## 2019-10-23 NOTE — Progress Notes (Signed)
Adult Psychoeducational Group Note  Date:  10/23/2019 Time:  9:51 PM  Group Topic/Focus:  Wrap-Up Group:   The focus of this group is to help patients review their daily goal of treatment and discuss progress on daily workbooks.  Participation Level:  Did Not Attend  Participation Quality:  Did Not Attend  Affect:  Did Not Attend  Cognitive:  Did Not Attend  Insight: None  Engagement in Group:  Did Not Attend  Modes of Intervention:  Did Not Attend  Additional Comments:  Pt did not attend evening wrap up group.  Candy Sledge 10/23/2019, 9:51 PM

## 2019-10-23 NOTE — Progress Notes (Signed)
F - Mood stabilization and decrease symptoms of psychosis.  D - The patient remained in bed past breakfast time, accepted medications prior to lunch, and watched television in the milieu.  She was pleasant upon contact with a flat affect.  Julia [name] answered questions from nursing students and denied thoughts of harming herself/other.  She denied symptoms of psychosis and did not appear to be responding to internal stimuli.  The patient did not display any unsafe or bizarre behaviors.  Mood was stable.  Morning vitals were refused.   A - Emotional support and medications provided.  [name] required urging in the morning to join peers in the milieu.  Afterward, she remained awake and visible.  No significant issues to note.  15-minute safety checks maintained.    R - Continue with current treatment plan and monitor for safety.
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
## 2019-10-23 NOTE — Progress Notes (Signed)
   10/23/19 2010  COVID-19 Daily Checkoff  Have you had a fever (temp > 37.80C/100F)  in the past 24 hours?  No  If you have had runny nose, nasal congestion, sneezing in the past 24 hours, has it worsened? No  COVID-19 EXPOSURE  Have you traveled outside the state in the past 14 days? No  Have you been in contact with someone with a confirmed diagnosis of COVID-19 or PUI in the past 14 days without wearing appropriate PPE? No  Have you been living in the same home as a person with confirmed diagnosis of COVID-19 or a PUI (household contact)? No  Have you been diagnosed with COVID-19? No

## 2019-10-24 DIAGNOSIS — R4589 Other symptoms and signs involving emotional state: Secondary | ICD-10-CM | POA: Diagnosis not present
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
## 2019-10-24 NOTE — [facility] Counselor (Signed)
Clinical Social Work Note  Patient attempted to contact [place] [phone]) but there was no answer and the voicemail was full.  Selmer Dominion, LCSW 10/24/2019, 5:00 PM

## 2019-10-24 NOTE — Progress Notes (Signed)
Detective Claiborne Billings with [employer] contacted this facility attempting to confirm location of Julia [name].  She had been reported missing from the [facility] group home/substance use treatment facility.  Natiya provided permission to confirm that she is a patient here and not missing.  Detective Claiborne Billings reported that she will be removing Manal from the [facility].  She advised that Pencie had signed herself out of the group home and still has belongings there.

## 2019-10-24 NOTE — Progress Notes (Signed)
   10/24/19 2000  COVID-19 Daily Checkoff  Have you had a fever (temp > 37.80C/100F)  in the past 24 hours?  No  COVID-19 EXPOSURE  Have you traveled outside the state in the past 14 days? No  Have you been in contact with someone with a confirmed diagnosis of COVID-19 or PUI in the past 14 days without wearing appropriate PPE? No  Have you been living in the same home as a person with confirmed diagnosis of COVID-19 or a PUI (household contact)? No  Have you been diagnosed with COVID-19? No

## 2019-10-24 NOTE — [facility] Group Notes (Signed)
[facility] LCSW Group Therapy Note  Date/Time:  10/24/2019 11:15A-12:00P  Type of Therapy and Topic:  Group Therapy:  Healthy and Unhealthy Supports  Participation Level:  Active   Description of Group:  Patients in this group were introduced to the idea of adding a variety of healthy supports to address the various needs in their lives.Patients discussed what additional healthy supports could be helpful in their recovery and wellness after discharge in order to prevent future hospitalizations.   An emphasis was placed on using counselor, doctor, therapy groups, 12-step groups, and problem-specific support groups to expand supports.  They also worked as a group on developing a specific plan for several patients to deal with unhealthy supports through [place], psychoeducation with loved ones, and even termination of relationships.   Therapeutic Goals:   1)  discuss importance of adding supports to stay well once out of the hospital  2)  compare healthy versus unhealthy supports and identify some examples of each  3)  generate ideas and descriptions of healthy supports that can be added  4)  offer mutual support about how to address unhealthy supports  5)  encourage active participation in and adherence to discharge plan    Summary of Patient Progress:  The patient openly engaged in initial check-in, sharing of feeling "good". Pt proved to continue active engagement in group discussion, sharing pt's understanding of support to be "helping". Pt stated that current healthy supports in her life are her boyfriend and her dad. Pt did not identify any unhealthy supports. Pt proved receptive to adding professional supports to assist in ongoing recovery following discharge. Pt proved receptive to alternate group members input and feedback provided by CSW.   Therapeutic Modalities:   Motivational Interviewing Brief Solution-Focused Therapy  Blane Ohara, LCSWA 10/24/2019  2:04 PM

## 2019-10-24 NOTE — [facility] Group Notes (Signed)
Adult Psychoeducational Group Note  Date:  10/24/2019 Time:  12:11 PM  Group Topic/Focus:   PROGRESSIVE RELAXATION.[name] A group where deep breathing is taught and tensing and relaxation muscle groups is used. Imagery is used with both deep breathing and tensing and relaxing of the muscle groups. Pt;s are asked to imagine 3 pillars that hold them up when they are not able to hold themselves up.  Participation Level:  Minimal  Participation Quality:  Appropriate  Affect:  Flat  Cognitive:  Appropriate  Insight: Lacking  Engagement in Group:  Engaged  Modes of Intervention:  Activity, Socialization and Support  Additional Comments:  Pt attended the group but is slow in respoinding.  Bryson Dames A 10/24/2019, 12:11 PM

## 2019-10-24 NOTE — Progress Notes (Signed)
Pt has been pleasant and has voiced no complaints or concerns. She was seen watching TV in the dayroom this evening. Interaction with other patients is minimal. She did report attending group today on relaxation techniques, but said that she did not take anything away from it. Pt denies SI/HI and auditory/visual hallucinations. Active listening and reassurance provided. Q 15 minute safety checks continue.

## 2019-10-24 NOTE — Progress Notes (Signed)
Adult Psychoeducational Group Note  Date:  10/24/2019 Time:  10:05 PM  Group Topic/Focus:  Wrap-Up Group:   The focus of this group is to help patients review their daily goal of treatment and discuss progress on daily workbooks.  Participation Level:  Minimal  Participation Quality:  Appropriate  Affect:  Flat  Cognitive:  Appropriate  Insight: Appropriate  Engagement in Group:  Limited  Modes of Intervention:  Discussion  Additional Comments: Pt stated her goal for today was to focus on her treatment plan. Pt stated she accomplished her goal today. Pt stated her relationship with her family has improved since she was admitted. Pt stated been able to contact her Aunt and father today improved her day. Pt rated her overall day a 5 out of 10. Pt stated her appetite was pretty good today. Pt stated her sleep last night was pretty good. Pt stated she was in no physical pain today.  Pt deny auditory or visual hallucinations. Pt denies thoughts of harming herself or others. Pt stated she would alert staff if anything changes.   Candy Sledge 10/24/2019, 10:05 PM

## 2019-10-24 NOTE — Plan of Care (Signed)
Progress note  D: pt found in bed; allowed to rest. Upon awakening, pt compliant with medication administration. Pt denies any physical symptoms or complaints. Pt has been viewed in the dayroom watching television and attending group. Pt hasn't interacted with others though. Pt seems to be responding to internal stimuli but is pleasant. Pt denies si/hi/ah/vh and verbally agrees to approach staff if these become apparent or before harming themself/others while at [facility].  A: Pt provided support and encouragement. Pt given medication per protocol and standing orders. Q85m safety checks implemented and continued.  R: Pt safe on the unit. Will continue to monitor.  Pt progressing in the following metrics  Problem: Education: Goal: Knowledge of [facility] Education information/materials will improve Outcome: Progressing Goal: Verbalization of understanding the information provided will improve Outcome: Progressing   Problem: Activity: Goal: Interest or engagement in activities will improve Outcome: Progressing Goal: Sleeping patterns will improve Outcome: Progressing

## 2019-10-25 DIAGNOSIS — R4589 Other symptoms and signs involving emotional state: Secondary | ICD-10-CM | POA: Diagnosis not present

## 2019-10-25 NOTE — Progress Notes (Signed)
Recreation Therapy Notes  Date: 4.12.21 Time: 1000 Location: 500 Hall Dayroom  Group Topic: Coping Skills  Goal Area(s) Addresses:  Patient will identify positive coping skills. Patient will identify benefit of using coping skills post d/c.  Behavioral Response: Engaged  Intervention: Game  Activity: Scientist, clinical (histocompatibility and immunogenetics).  LRT and patients played Jenga as the instructions described, in addition, patients were to identify a positive coping skill.  Patients and LRT could not repeat coping skills that had already been identify.  When the tower fell over, the game would restart and all coping skills were up for grabs.  Education: Radiographer, therapeutic, Dentist.   Education Outcome: Acknowledges understanding/In group clarification offered/Needs additional education.   Clinical Observations/Feedback: Pt was active and smiling at points during group session.  Pt was also shaking when it was her turn.  Pt identified some coping skills as writing a poem, reading a book, writing a play, watch movies and playing hop scotch.  Pt was appropriate and as activity went on.    [name], LRT/CTRS    [name] A 10/25/2019 11:23 AM

## 2019-10-25 NOTE — Progress Notes (Signed)
   10/25/19 2000  Psych Admission Type (Psych Patients Only)  Admission Status Voluntary  Psychosocial Assessment  Patient Complaints None  Eye Contact Fair  Facial Expression Blank;Flat  Affect Appropriate to circumstance  Speech Logical/coherent  Interaction Assertive  Motor Activity Slow  Appearance/Hygiene Unremarkable  Behavior Characteristics Cooperative  Mood Pleasant  Thought Process  Coherency WDL  Content WDL  Delusions None reported or observed  Perception WDL  Hallucination None reported or observed  Judgment WDL  Confusion None  Danger to Self  Current suicidal ideation? Denies  Danger to Others  Danger to Others None reported or observed

## 2019-10-25 NOTE — Progress Notes (Signed)
[facility] MD Progress Note  10/25/2019 7:52 AM Julia [name]  MRN:  [id number] Subjective:  This is the latest of multiple psychiatric admissions for this 19 year old patient with treatment resistant schizophrenia, disruptions at her group home setting, and persistent delusional believes on presentation.  She has responded well to the escalation in paliperidone/oral dosing-  Patient seen and is doing well, resting in bed. She denies any visual or auditory hallucinations. Says she thinks her meds are working well and is in agreement with continuing them. No signs of any medication side effects, specifically noted no involuntary movements. She is not having any suicidal or homicidal ideation.     Principal Problem: Schizophrenia, paranoid ([place]) Diagnosis: Principal Problem:   Schizophrenia, paranoid ([place]) Active Problems:   Depressed mood   Psychosis ([place])   MDD (major depressive disorder)  Total Time spent with patient: 20 minutes  Past Psychiatric History: Generally treatment resistant and has had periods of impulsivity and volatility/periods of noncompliance/protracted hospital stays/and during her last hospitalization was actually given combination long-acting injectable therapy due to her treatment resistance  Past Medical History:  Past Medical History:  Diagnosis Date  . Burning with urination 05/03/2015  . Contraceptive management 05/03/2015  . Depression   . Dysmenorrhea 12/30/2013  . Heroin addiction ([place])   . Menorrhagia 12/30/2013  . Menstrual extraction 12/30/2013  . Migraines   . Social anxiety disorder 09/25/2016  . Suicidal ideations   . Vaginal odor 05/03/2015    Past Surgical History:  Procedure Laterality Date  . NO PAST SURGERIES     Family History:  Family History  Problem Relation Age of Onset  . Depression Mother   . Hypertension Father   . Hyperlipidemia Father   . Cancer Paternal Grandmother        breast, uterine  . Cirrhosis Paternal Grandfather         due to alcohol   Family Psychiatric  History: see eval Social History:  Social History   Substance and Sexual Activity  Alcohol Use Yes   Comment: BAC was clear     Social History   Substance and Sexual Activity  Drug Use Yes  . Types: Marijuana, Oxycodone   Comment: reports oxycodone and marijuana    Social History   Socioeconomic History  . Marital status: Single    Spouse name: Not on file  . Number of children: Not on file  . Years of education: Not on file  . Highest education level: Not on file  Occupational History  . Occupation: Unemployed  Tobacco Use  . Smoking status: Current Every Day Smoker    Packs/day: 0.50    Types: Cigarettes  . Smokeless tobacco: Never Used  . Tobacco comment: Not ready to quit  Substance and Sexual Activity  . Alcohol use: Yes    Comment: BAC was clear  . Drug use: Yes    Types: Marijuana, Oxycodone    Comment: reports oxycodone and marijuana  . Sexual activity: Yes    Birth control/protection: Implant  Other Topics Concern  . Not on file  Social History Narrative   06/23/2019:  Pt stated that she is homeless, that she is a high school graduate, and that she is unemployed and not followed by any outpatient provider.      Lives with Dad. Mom has LGD, lives in [place]. 11th grader. Dog.   Social Determinants of Health   Financial Resource Strain:   . Difficulty of Paying Living Expenses:   Food Insecurity:   .  Worried About Charity fundraiser in the Last Year:   . Arboriculturist in the Last Year:   Transportation Needs:   . Film/video editor (Medical):   [name] Lack of Transportation (Non-Medical):   Physical Activity:   . Days of Exercise per Week:   . Minutes of Exercise per Session:   Stress:   . Feeling of Stress :   Social Connections:   . Frequency of Communication with Friends and Family:   . Frequency of Social Gatherings with Friends and Family:   . Attends Religious Services:   . Active Member of Clubs or  Organizations:   . Attends Archivist Meetings:   [name] Marital Status:    Additional Social History:    Pain Medications: Please see MAR Prescriptions: Please see MAR, prozac and cogentin per pt Over the Counter: Please see MAR History of alcohol / drug use?: Yes Longest period of sobriety (when/how long): Unknown Negative Consequences of Use: Legal Name of Substance 1: alcohol 1 - Frequency: UTA 1 - Duration: UTA- pt denies 1 - Last Use / Amount: UTA                  Sleep: Fair  Appetite:  Fair  Current Medications: Current Facility-Administered Medications  Medication Dose Route Frequency Provider Last Rate Last Admin  . acetaminophen (TYLENOL) tablet 650 mg  650 mg Oral Q6H PRN Emmaline Kluver, FNP      . alum & mag hydroxide-simeth (MAALOX/MYLANTA) 200-200-20 MG/5ML suspension 30 mL  30 mL Oral Q4H PRN Emmaline Kluver, FNP      . benztropine (COGENTIN) tablet 1 mg  1 mg Oral BID PRN Cobos, Myer Peer, MD      . FLUoxetine (PROZAC) capsule 40 mg  40 mg Oral Daily Cobos, Myer Peer, MD   40 mg at 10/24/19 1115  . gabapentin (NEURONTIN) capsule 100 mg  100 mg Oral TID Cobos, Myer Peer, MD   100 mg at 10/24/19 2314  . haloperidol (HALDOL) tablet 5 mg  5 mg Oral Q6H PRN Cobos, Myer Peer, MD       Or  . haloperidol lactate (HALDOL) injection 5 mg  5 mg Intramuscular Q6H PRN Cobos, Myer Peer, MD      . LORazepam (ATIVAN) tablet 2 mg  2 mg Oral Q6H PRN Cobos, Myer Peer, MD       Or  . LORazepam (ATIVAN) injection 2 mg  2 mg Intramuscular Q6H PRN Cobos, Fernando A, MD      . magnesium hydroxide (MILK OF MAGNESIA) suspension 30 mL  30 mL Oral Daily PRN Emmaline Kluver, FNP      . nicotine polacrilex (NICORETTE) gum 2 mg  2 mg Oral PRN Cobos, Myer Peer, MD   2 mg at 10/24/19 1830  . paliperidone (INVEGA) 24 hr tablet 9 mg  9 mg Oral Daily [name], MD   9 mg at 10/24/19 1115  . sulfamethoxazole-trimethoprim (BACTRIM DS) 800-160 MG per tablet 1 tablet  1 tablet Oral Q12H  Sharma Covert, MD   1 tablet at 10/24/19 2312  . traZODone (DESYREL) tablet 50 mg  50 mg Oral QHS PRN [name], NP   50 mg at 10/23/19 2009    Lab Results: No results found for this or any previous visit (from the past 48 hour(s)).  Blood Alcohol level:  Lab Results  Component Value Date   ETH <10 10/16/2019   ETH <10  123456    Metabolic Disorder Labs: Lab Results  Component Value Date   HGBA1C 5.4 10/16/2019   MPG 108.28 10/16/2019   MPG 102.54 05/17/2019   Lab Results  Component Value Date   PROLACTIN 152.0 (H) 10/16/2019   PROLACTIN 31.9 (H) 02/17/2019   Lab Results  Component Value Date   CHOL 138 10/16/2019   TRIG 60 10/16/2019   HDL 36 (L) 10/16/2019   CHOLHDL 3.8 10/16/2019   VLDL 12 10/16/2019   LDLCALC 90 10/16/2019   LDLCALC 96 05/17/2019    Physical Findings: AIMS: Facial and Oral Movements Muscles of Facial Expression: None, normal Lips and Perioral Area: None, normal Jaw: None, normal Tongue: None, normal,Extremity Movements Upper (arms, wrists, hands, fingers): None, normal Lower (legs, knees, ankles, toes): None, normal, Trunk Movements Neck, shoulders, hips: None, normal, Overall Severity Severity of abnormal movements (highest score from questions above): None, normal Incapacitation due to abnormal movements: None, normal Patient's awareness of abnormal movements (rate only patient's report): No Awareness, Dental Status Current problems with teeth and/or dentures?: No Does patient usually wear dentures?: No  CIWA:  CIWA-Ar Total: 1 COWS:  COWS Total Score: 2  Musculoskeletal: Strength & Muscle Tone: within normal limits Gait & Station: normal Patient leans: N/A  Psychiatric Specialty Exam: Physical Exam  Review of Systems  Blood pressure 97/65, pulse (!) 105, temperature 98.6 F (37 C), temperature source Oral, resp. rate 18, height 5\' 3"  (1.6 m), weight 67.8 kg, SpO2 98 %.Body mass index is 26.48 kg/m.  General  Appearance: Casual  Eye Contact:  Good  Speech:  Clear and Coherent  Volume:  Decreased  Mood:  Euthymic  Affect:  Blunt  Thought Process:  Goal Directed  Orientation:  Full (Time, Place, and Person)  Thought Content:  Some poverty of content with detailed questioning but no current positive symptoms are reported  Suicidal Thoughts:  No  Homicidal Thoughts:  No  Memory:  Not assessed   Judgement:  Fair  Insight:  Fair  Psychomotor Activity:  Normal  Concentration:  Not assessed  Recall: Not assessed  Fund of Knowledge:  Not assessed  Language:  Fair  Akathisia:  Negative  Handed:  Right  AIMS (if indicated):     Assets:  Communication Skills Leisure Time Physical Health Resilience  ADL's:  Intact  Cognition:  WNL  Sleep:  Number of Hours: 6     Treatment Plan Summary: Daily contact with patient to assess and evaluate symptoms and progress in treatment and Medication management  Plan to continue her antipsychotic therapy, paliperidone (9mg ) and maintain the same precautions. She seems to be at her baseline, will continue to follow daily and monitor progress. Continue treatment with Bactrim for her UTI. Awaiting a placement for discharge.   Casimiro Needle, Medical Student 10/25/2019, 7:52 AM

## 2019-10-25 NOTE — BHH Counselor (Signed)
CSW attempted to reach [name] at [facility]). The call was not answered and the voicemail box is full.   Weekend CSW team also attempted to contact group home without success. CSW last spoke with Group Home staff on Thursday, 04/08.  TOC Supervisor, Lurline Idol notified.  [name], MSW, LCSW-A Clinical Social Worker [facility]

## 2019-10-25 NOTE — Progress Notes (Signed)
[facility] MD Progress Note  10/25/2019 8:58 AM Julia [name]  MRN:  [id number] Subjective:   Patient in bed generally stable in mood denies current auditory or visual hallucinations seems to have achieved her baseline status with the escalation in Invega dosing.  No change in precautions Principal Problem: Schizophrenia, paranoid ([place]) Diagnosis: Principal Problem:   Schizophrenia, paranoid ([place]) Active Problems:   Depressed mood   Psychosis ([place])   MDD (major depressive disorder)  Total Time spent with patient: 20 minutes  Past Psychiatric History: See eval  Past Medical History:  Past Medical History:  Diagnosis Date  . Burning with urination 05/03/2015  . Contraceptive management 05/03/2015  . Depression   . Dysmenorrhea 12/30/2013  . Heroin addiction ([place])   . Menorrhagia 12/30/2013  . Menstrual extraction 12/30/2013  . Migraines   . Social anxiety disorder 09/25/2016  . Suicidal ideations   . Vaginal odor 05/03/2015    Past Surgical History:  Procedure Laterality Date  . NO PAST SURGERIES     Family History:  Family History  Problem Relation Age of Onset  . Depression Mother   . Hypertension Father   . Hyperlipidemia Father   . Cancer Paternal Grandmother        breast, uterine  . Cirrhosis Paternal Grandfather        due to alcohol   Family Psychiatric  History: See eval Social History:  Social History   Substance and Sexual Activity  Alcohol Use Yes   Comment: BAC was clear     Social History   Substance and Sexual Activity  Drug Use Yes  . Types: Marijuana, Oxycodone   Comment: reports oxycodone and marijuana    Social History   Socioeconomic History  . Marital status: Single    Spouse name: Not on file  . Number of children: Not on file  . Years of education: Not on file  . Highest education level: Not on file  Occupational History  . Occupation: Unemployed  Tobacco Use  . Smoking status: Current Every Day Smoker    Packs/day: 0.50    Types:  Cigarettes  . Smokeless tobacco: Never Used  . Tobacco comment: Not ready to quit  Substance and Sexual Activity  . Alcohol use: Yes    Comment: BAC was clear  . Drug use: Yes    Types: Marijuana, Oxycodone    Comment: reports oxycodone and marijuana  . Sexual activity: Yes    Birth control/protection: Implant  Other Topics Concern  . Not on file  Social History Narrative   06/23/2019:  Pt stated that she is homeless, that she is a high school graduate, and that she is unemployed and not followed by any outpatient provider.      Lives with Dad. Mom has LGD, lives in [place]. 11th grader. Dog.   Social Determinants of Health   Financial Resource Strain:   . Difficulty of Paying Living Expenses:   Food Insecurity:   . Worried About Charity fundraiser in the Last Year:   . Arboriculturist in the Last Year:   Transportation Needs:   . Film/video editor (Medical):   [name] Lack of Transportation (Non-Medical):   Physical Activity:   . Days of Exercise per Week:   . Minutes of Exercise per Session:   Stress:   . Feeling of Stress :   Social Connections:   . Frequency of Communication with Friends and Family:   . Frequency of Social Gatherings with  Friends and Family:   . Attends Religious Services:   . Active Member of Clubs or Organizations:   . Attends Archivist Meetings:   [name] Marital Status:    Additional Social History:    Pain Medications: Please see MAR Prescriptions: Please see MAR, prozac and cogentin per pt Over the Counter: Please see MAR History of alcohol / drug use?: Yes Longest period of sobriety (when/how long): Unknown Negative Consequences of Use: Legal Name of Substance 1: alcohol 1 - Frequency: UTA 1 - Duration: UTA- pt denies 1 - Last Use / Amount: UTA                  Sleep: Good  Appetite:  Fair  Current Medications: Current Facility-Administered Medications  Medication Dose Route Frequency Provider Last Rate Last Admin  .  acetaminophen (TYLENOL) tablet 650 mg  650 mg Oral Q6H PRN Emmaline Kluver, FNP      . alum & mag hydroxide-simeth (MAALOX/MYLANTA) 200-200-20 MG/5ML suspension 30 mL  30 mL Oral Q4H PRN Emmaline Kluver, FNP      . benztropine (COGENTIN) tablet 1 mg  1 mg Oral BID PRN Cobos, Myer Peer, MD      . FLUoxetine (PROZAC) capsule 40 mg  40 mg Oral Daily Cobos, Myer Peer, MD   40 mg at 10/24/19 1115  . gabapentin (NEURONTIN) capsule 100 mg  100 mg Oral TID Cobos, Myer Peer, MD   100 mg at 10/24/19 2314  . haloperidol (HALDOL) tablet 5 mg  5 mg Oral Q6H PRN Cobos, Myer Peer, MD       Or  . haloperidol lactate (HALDOL) injection 5 mg  5 mg Intramuscular Q6H PRN Cobos, Myer Peer, MD      . LORazepam (ATIVAN) tablet 2 mg  2 mg Oral Q6H PRN Cobos, Myer Peer, MD       Or  . LORazepam (ATIVAN) injection 2 mg  2 mg Intramuscular Q6H PRN Cobos, Fernando A, MD      . magnesium hydroxide (MILK OF MAGNESIA) suspension 30 mL  30 mL Oral Daily PRN Emmaline Kluver, FNP      . nicotine polacrilex (NICORETTE) gum 2 mg  2 mg Oral PRN Cobos, Myer Peer, MD   2 mg at 10/24/19 1830  . paliperidone (INVEGA) 24 hr tablet 9 mg  9 mg Oral Daily [name], MD   9 mg at 10/24/19 1115  . sulfamethoxazole-trimethoprim (BACTRIM DS) 800-160 MG per tablet 1 tablet  1 tablet Oral Q12H Sharma Covert, MD   1 tablet at 10/24/19 2312  . traZODone (DESYREL) tablet 50 mg  50 mg Oral QHS PRN [name], NP   50 mg at 10/23/19 2009    Lab Results: No results found for this or any previous visit (from the past 48 hour(s)).  Blood Alcohol level:  Lab Results  Component Value Date   ETH <10 10/16/2019   ETH <10 123456    Metabolic Disorder Labs: Lab Results  Component Value Date   HGBA1C 5.4 10/16/2019   MPG 108.28 10/16/2019   MPG 102.54 05/17/2019   Lab Results  Component Value Date   PROLACTIN 152.0 (H) 10/16/2019   PROLACTIN 31.9 (H) 02/17/2019   Lab Results  Component Value Date   CHOL 138 10/16/2019    TRIG 60 10/16/2019   HDL 36 (L) 10/16/2019   CHOLHDL 3.8 10/16/2019   VLDL 12 10/16/2019   LDLCALC 90 10/16/2019   LDLCALC 96  05/17/2019    Physical Findings: AIMS: Facial and Oral Movements Muscles of Facial Expression: None, normal Lips and Perioral Area: None, normal Jaw: None, normal Tongue: None, normal,Extremity Movements Upper (arms, wrists, hands, fingers): None, normal Lower (legs, knees, ankles, toes): None, normal, Trunk Movements Neck, shoulders, hips: None, normal, Overall Severity Severity of abnormal movements (highest score from questions above): None, normal Incapacitation due to abnormal movements: None, normal Patient's awareness of abnormal movements (rate only patient's report): No Awareness, Dental Status Current problems with teeth and/or dentures?: No Does patient usually wear dentures?: No  CIWA:  CIWA-Ar Total: 1 COWS:  COWS Total Score: 2  Musculoskeletal: Strength & Muscle Tone: within normal limits Gait & Station: normal Patient leans: N/A  Psychiatric Specialty Exam: Physical Exam  Review of Systems  Blood pressure 97/65, pulse (!) 105, temperature 98.6 F (37 C), temperature source Oral, resp. rate 18, height 5\' 3"  (1.6 m), weight 67.8 kg, SpO2 98 %.Body mass index is 26.48 kg/m.  General Appearance: Casual  Eye Contact:  Good  Speech:  Clear and Coherent  Volume:  Normal  Mood:  Euthymic  Affect:  Full Range  Thought Process:  Coherent and Goal Directed  Orientation:  Full (Time, Place, and Person)  Thought Content:  Logical  Suicidal Thoughts:  No  Homicidal Thoughts:  No  Memory:  Immediate;   Fair Recent;   Fair Remote;   Fair  Judgement:  Fair  Insight:  Fair  Psychomotor Activity:  EPS - negative  Concentration:  Concentration: Fair and Attention Span: Fair  Recall:  [employer] of Knowledge:  Fair  Language:  Fair  Akathisia:  Negative  Handed:  Right  AIMS (if indicated):     Assets:  Communication Skills Desire for  Improvement  ADL's:  Intact  Cognition:  WNL  Sleep:  Number of Hours: 6     Treatment Plan Summary: Daily contact with patient to assess and evaluate symptoms and progress in treatment and Medication management no change in precautions continue current meds  Elodie Panameno, MD 10/25/2019, 8:58 AM

## 2019-10-25 NOTE — BHH Counselor (Signed)
Patient's [facility] APS report has not been accepted for additional screening. There are no updates regarding guardianship for this patient.  [facility] has not answered or returned calls regarding accepting patient into their group home.  CSW left a detailed voicemail for Apolonio Schneiders at [facility] in an attempt to facilitate a shelter placement.  CSW also left a detailed voicemail for [employer], Ms.Harding at [employer]. Per report of [facility] Social Work team, patient may not return to [facility] due to behavioral issues.  CSW continuing to follow for a safe disposition.  [name], MSW, LCSW-A Clinical Social Worker [facility]

## 2019-10-25 NOTE — BHH Group Notes (Signed)
LCSW Aftercare Discharge Planning Group Note  10/25/2019   Type of Group and Topic: Psychoeducational Group: Discharge Planning  Participation Level: Did Not Attend  Description of Group  Discharge planning group reviews patient's anticipated discharge plans and assists patients to anticipate and address any barriers to wellness/recovery in the community. Suicide prevention education is reviewed with patients in group.  Therapeutic Goals  1. Patients will state their anticipated discharge plan and mental health aftercare  2. Patients will identify potential barriers to wellness in the community setting  3. Patients will engage in problem solving, solution focused discussion of ways to anticipate and address barriers to wellness/recovery  Summary of Patient Progress  Plan for Discharge/Comments:  Transportation Means:  Supports:  Therapeutic Modalities:  Julia [name], Julia [name]  10/25/2019 2:14 PM

## 2019-10-25 NOTE — Plan of Care (Signed)
Progress note  D: pt found in bed; compliant with medication administration. Pt states they are still sleepy but slept well. Pt denies any physical complaints or pain. Pt is pleasant but minimal. Pt denies si/hi/ah/vh and verbally agrees to approach staff if these become apparent or before harming themself/others while at [place]: Pt provided support and encouragement. Pt given medication per protocol and standing orders. Q41m safety checks implemented and continued.  R: Pt safe on the unit. Will continue to monitor.  Pt progressing in the following metrics  Problem: Coping: Goal: Ability to verbalize frustrations and anger appropriately will improve Outcome: Progressing   Problem: Health Behavior/Discharge Planning: Goal: Identification of resources available to assist in meeting health care needs will improve Outcome: Progressing Goal: Compliance with treatment plan for underlying cause of condition will improve Outcome: Progressing

## 2019-10-26 DIAGNOSIS — R4589 Other symptoms and signs involving emotional state: Secondary | ICD-10-CM | POA: Diagnosis not present

## 2019-10-26 LAB — URINE CULTURE: Culture: >=100000 — AB

## 2019-10-26 NOTE — Progress Notes (Signed)
Psychoeducational Group Note  Date:  10/26/2019 Time:  2040  Group Topic/Focus:  Wellness Toolbox:   The focus of this group is to discuss various aspects of wellness, balancing those aspects and exploring ways to increase the ability to experience wellness.  Patients will create a wellness toolbox for use upon discharge.  Participation Level: Did Not Attend  Participation Quality:  Not Applicable  Affect:  Not Applicable  Cognitive:  Not Applicable  Insight:  Not Applicable  Engagement in Group: Not Applicable  Additional Comments:  The patient did not attend group.   Archie Balboa S 10/26/2019, 8:40 PM

## 2019-10-26 NOTE — Progress Notes (Signed)
[facility] MD Progress Note  10/26/2019 11:24 AM Julia [name]  MRN:  [id number] Subjective:    Patient remains passive, generally sleeping in the days, but denies all positive symptoms today.  Denies thoughts of harming self or others and can contract and understands what it means to contract for safety Principal Problem: Schizophrenia, paranoid ([place]) Diagnosis: Principal Problem:   Schizophrenia, paranoid ([place]) Active Problems:   Depressed mood   Psychosis ([place])   MDD (major depressive disorder)  Total Time spent with patient: 20 minutes  Past Psychiatric History: As above  Past Medical History:  Past Medical History:  Diagnosis Date  . Burning with urination 05/03/2015  . Contraceptive management 05/03/2015  . Depression   . Dysmenorrhea 12/30/2013  . Heroin addiction ([place])   . Menorrhagia 12/30/2013  . Menstrual extraction 12/30/2013  . Migraines   . Social anxiety disorder 09/25/2016  . Suicidal ideations   . Vaginal odor 05/03/2015    Past Surgical History:  Procedure Laterality Date  . NO PAST SURGERIES     Family History:  Family History  Problem Relation Age of Onset  . Depression Mother   . Hypertension Father   . Hyperlipidemia Father   . Cancer Paternal Grandmother        breast, uterine  . Cirrhosis Paternal Grandfather        due to alcohol   Family Psychiatric  History: See eval Social History:  Social History   Substance and Sexual Activity  Alcohol Use Yes   Comment: BAC was clear     Social History   Substance and Sexual Activity  Drug Use Yes  . Types: Marijuana, Oxycodone   Comment: reports oxycodone and marijuana    Social History   Socioeconomic History  . Marital status: Single    Spouse name: Not on file  . Number of children: Not on file  . Years of education: Not on file  . Highest education level: Not on file  Occupational History  . Occupation: Unemployed  Tobacco Use  . Smoking status: Current Every Day Smoker    Packs/day:  0.50    Types: Cigarettes  . Smokeless tobacco: Never Used  . Tobacco comment: Not ready to quit  Substance and Sexual Activity  . Alcohol use: Yes    Comment: BAC was clear  . Drug use: Yes    Types: Marijuana, Oxycodone    Comment: reports oxycodone and marijuana  . Sexual activity: Yes    Birth control/protection: Implant  Other Topics Concern  . Not on file  Social History Narrative   06/23/2019:  Pt stated that she is homeless, that she is a high school graduate, and that she is unemployed and not followed by any outpatient provider.      Lives with Dad. Mom has LGD, lives in [place]. 11th grader. Dog.   Social Determinants of Health   Financial Resource Strain:   . Difficulty of Paying Living Expenses:   Food Insecurity:   . Worried About Charity fundraiser in the Last Year:   . Arboriculturist in the Last Year:   Transportation Needs:   . Film/video editor (Medical):   [name] Lack of Transportation (Non-Medical):   Physical Activity:   . Days of Exercise per Week:   . Minutes of Exercise per Session:   Stress:   . Feeling of Stress :   Social Connections:   . Frequency of Communication with Friends and Family:   . Frequency  of Social Gatherings with Friends and Family:   . Attends Religious Services:   . Active Member of Clubs or Organizations:   . Attends Archivist Meetings:   [name] Marital Status:    Additional Social History:    Pain Medications: Please see MAR Prescriptions: Please see MAR, prozac and cogentin per pt Over the Counter: Please see MAR History of alcohol / drug use?: Yes Longest period of sobriety (when/how long): Unknown Negative Consequences of Use: Legal Name of Substance 1: alcohol 1 - Frequency: UTA 1 - Duration: UTA- pt denies 1 - Last Use / Amount: UTA                  Sleep: Good  Appetite:  Good  Current Medications: Current Facility-Administered Medications  Medication Dose Route Frequency Provider Last Rate  Last Admin  . acetaminophen (TYLENOL) tablet 650 mg  650 mg Oral Q6H PRN Emmaline Kluver, FNP      . alum & mag hydroxide-simeth (MAALOX/MYLANTA) 200-200-20 MG/5ML suspension 30 mL  30 mL Oral Q4H PRN Emmaline Kluver, FNP      . benztropine (COGENTIN) tablet 1 mg  1 mg Oral BID PRN Cobos, Myer Peer, MD      . FLUoxetine (PROZAC) capsule 40 mg  40 mg Oral Daily Cobos, Myer Peer, MD   40 mg at 10/26/19 0849  . gabapentin (NEURONTIN) capsule 100 mg  100 mg Oral TID Cobos, Myer Peer, MD   100 mg at 10/26/19 0850  . haloperidol (HALDOL) tablet 5 mg  5 mg Oral Q6H PRN Cobos, Myer Peer, MD   5 mg at 10/25/19 1432   Or  . haloperidol lactate (HALDOL) injection 5 mg  5 mg Intramuscular Q6H PRN Cobos, Myer Peer, MD      . LORazepam (ATIVAN) tablet 2 mg  2 mg Oral Q6H PRN Cobos, Myer Peer, MD   2 mg at 10/25/19 1432   Or  . LORazepam (ATIVAN) injection 2 mg  2 mg Intramuscular Q6H PRN Cobos, Fernando A, MD      . magnesium hydroxide (MILK OF MAGNESIA) suspension 30 mL  30 mL Oral Daily PRN Emmaline Kluver, FNP      . nicotine polacrilex (NICORETTE) gum 2 mg  2 mg Oral PRN Cobos, Myer Peer, MD   2 mg at 10/25/19 1528  . paliperidone (INVEGA) 24 hr tablet 9 mg  9 mg Oral Daily [name], MD   9 mg at 10/26/19 0849  . sulfamethoxazole-trimethoprim (BACTRIM DS) 800-160 MG per tablet 1 tablet  1 tablet Oral Q12H Sharma Covert, MD   1 tablet at 10/26/19 0850  . traZODone (DESYREL) tablet 50 mg  50 mg Oral QHS PRN [name], NP   50 mg at 10/23/19 2009    Lab Results: No results found for this or any previous visit (from the past 48 hour(s)).  Blood Alcohol level:  Lab Results  Component Value Date   ETH <10 10/16/2019   ETH <10 123456    Metabolic Disorder Labs: Lab Results  Component Value Date   HGBA1C 5.4 10/16/2019   MPG 108.28 10/16/2019   MPG 102.54 05/17/2019   Lab Results  Component Value Date   PROLACTIN 152.0 (H) 10/16/2019   PROLACTIN 31.9 (H) 02/17/2019   Lab  Results  Component Value Date   CHOL 138 10/16/2019   TRIG 60 10/16/2019   HDL 36 (L) 10/16/2019   CHOLHDL 3.8 10/16/2019   VLDL 12  10/16/2019   [place] 90 10/16/2019   LDLCALC 96 05/17/2019    Physical Findings: AIMS: Facial and Oral Movements Muscles of Facial Expression: None, normal Lips and Perioral Area: None, normal Jaw: None, normal Tongue: None, normal,Extremity Movements Upper (arms, wrists, hands, fingers): None, normal Lower (legs, knees, ankles, toes): None, normal, Trunk Movements Neck, shoulders, hips: None, normal, Overall Severity Severity of abnormal movements (highest score from questions above): None, normal Incapacitation due to abnormal movements: None, normal Patient's awareness of abnormal movements (rate only patient's report): No Awareness, Dental Status Current problems with teeth and/or dentures?: No Does patient usually wear dentures?: No  CIWA:  CIWA-Ar Total: 1 COWS:  COWS Total Score: 2  Musculoskeletal: Strength & Muscle Tone: within normal limits Gait & Station: normal Patient leans: N/A  Psychiatric Specialty Exam: Physical Exam  Review of Systems  Blood pressure 97/65, pulse (!) 105, temperature 98.6 F (37 C), temperature source Oral, resp. rate 18, height 5\' 3"  (1.6 m), weight 67.8 kg, SpO2 98 %.Body mass index is 26.48 kg/m.  General Appearance: Casual  Eye Contact:  Poor  Speech:  Slow  Volume:  Decreased  Mood:  Dysphoric  Affect:  Blunt  Thought Process:  Goal Directed  Orientation:  Full (Time, Place, and Person)  Thought Content:  Logical  Suicidal Thoughts:  No  Homicidal Thoughts:  No  Memory:  Immediate;   Poor Recent;   Fair Remote;   Fair  Judgement:  Fair  Insight:  Fair  Psychomotor Activity:  Normal  Concentration:  Concentration: Fair and Attention Span: Good  Recall:  [employer] of Knowledge:  Fair  Language:  Good  Akathisia:  Negative  Handed:  Right  AIMS (if indicated):     Assets:  Physical  Health Resilience Social Support  ADL's:  Intact  Cognition:  WNL  Sleep:  Number of Hours: 10.75     Treatment Plan Summary: Daily contact with patient to assess and evaluate symptoms and progress in treatment and Medication management  Continue current antipsychotic management continue cognitive and reality-based therapies med and illness education through groups and individually and continue to seek placement  Samay Delcarlo, MD 10/26/2019, 11:24 AM

## 2019-10-26 NOTE — Progress Notes (Signed)
1200: Supervisor received email from [employer] of [place] who said that she had spoken to her counterpart in [place], as they are responsible for this pt due to her being [facility] resident.  1600: Supervisor spoke to [employer], [place] supervisor, [place], [place].  Carye said APS report to them has been screened out due to pt being in the hospital and not in need of protection.  She is assigning an Estate agent", [employer], [phone], who will reach out to [facility] CSW, but the purpose of this would be for her to guide [facility] through the process of filing for guardianship and then [facility] would have to complete the process, testify in court, etc.  Supervisor informed her that [facility] has not done this in my experience and that is why we make APS reports to DSS.  Carye also said that ACT team should be doing guardianship work as well.  Carye did confirm that pt current medicaid is out of [facility], which does indicate she is resident there.  10/26/19: Supervisor spoke to [employer] of [employer] ACT.  THey continue to look for shelter options for pt but have not located any as of yesterday.  [name] said they do not file for guardianship either and rely on DSS to do this.  In her experience, the outreach worker has sometimes been helpful with locating other helpful resources and she will reach out to [employer].   [name], MSW, LCSW Advanced Care Supervisor 10/26/2019 8:59 AM

## 2019-10-26 NOTE — BHH Counselor (Signed)
CSW continuing to follow for a safe disposition.  CSW spoke with [place] "outreach worker,", Gilbert Spell [phone]).   Rip Harbour shared with CSW that the best way for patient to be appointed a legal guardian would be to file for an incompetency hearing. Rip Harbour shared that this would need to be completed in the county patient is currently located in, [place].  CSW reviewed with outreach worker that patient is from [facility] where her Medicaid is from and patient also receives ACTT services based out of [place]. Outreach worker confirms that the filing would need to be completed in [facility] and did not anticipate an interruption of other services as a result of the filing.   Outreach worker stated five forms would need to be completed and presented to the [facility], where a same-day emergency hearing could commence. The five forms are as follows.  1.) AOC-SP-200 Petition for Adjudication of Incompetency and Appointment of a Guardian 2.) AOC-SP-208 Guardianship Capacity Questionnaire (must be notarized) 3.) [id number] Certificate of Service (must be notarized) 4.) AOC-SP-550 Special Proceedings 5.) AOC-G-250 Active Member in Service Questionnaire    CSW also attempted to reach [place] at [employer] [facility], CSW left a detailed message.  CSW attempted to reach [name] at [facility]. CSW last spoke with group home on 04/08, at which time the group home was considering patient for admission. CSW has attempted to reach group home numerous times, as well as weekend staff.  [name], MSW, LCSW-A Clinical Social Worker [facility]

## 2019-10-26 NOTE — Progress Notes (Signed)
   10/26/19 1500  Vital Signs  Temp 98.5 F (36.9 C)  Temp Source Oral  Pulse Rate 97  Pulse Rate Source Dinamap  Resp 16  BP 115/78  BP Location Left Arm  BP Method Automatic  D: Patient Presents with depressed mood and affect.  Patient was calm and cooperative during med pass and took medicine without incident.  Patient was isolative in room.  Patient denies suicidal thoughts and self harming thoughts.A:  Patient took scheduled medicine.  Support and encouragement provided Routine safety checks conducted every 15 minutes. Patient  Informed to notify staff with any concerns.  Safety maintained.

## 2019-10-26 NOTE — Progress Notes (Signed)
Recreation Therapy Notes  Date: 4.13.21 Time: 1000 Location: 500 Hall Dayroom  Group Topic: Anxiety  Goal Area(s) Addresses:  Patient will identify triggers to anxiety. Patient will identify physical symptoms to anxiety. Patient will identify coping strategies for anxiety that can be used post d/c.  Intervention: Worksheet, pencils  Activity: Introduction to Anxiety.  Patients were to identify three things that trigger their anxiety, three physical symptoms they experience from anxiety, three thoughts they have from anxiety and coping strategies they use for anxiety.  Education: Communication, Discharge Planning  Education Outcome: Acknowledges understanding/In group clarification offered/Needs additional education.   Clinical Observations/Feedback: Pt did not attend group session.    Julia [name], LRT/CTRS         Julia [name] A 10/26/2019 11:34 AM

## 2019-10-26 NOTE — Progress Notes (Signed)
   10/26/19 2000  Psych Admission Type (Psych Patients Only)  Admission Status Voluntary  Psychosocial Assessment  Patient Complaints Worrying  Eye Contact Fair  Facial Expression Blank;Flat  Affect Appropriate to circumstance  Speech Logical/coherent  Interaction Assertive  Motor Activity Slow  Appearance/Hygiene Unremarkable  Behavior Characteristics Cooperative  Mood Pleasant  Thought Process  Coherency WDL  Content WDL  Delusions None reported or observed  Perception WDL  Hallucination None reported or observed  Judgment WDL  Confusion None  Danger to Self  Current suicidal ideation? Denies  Danger to Others  Danger to Others None reported or observed

## 2019-10-27 DIAGNOSIS — R4589 Other symptoms and signs involving emotional state: Secondary | ICD-10-CM | POA: Diagnosis not present

## 2019-10-27 NOTE — Progress Notes (Signed)
D. Pt presents with a flat affect, depressed mood- calm, cooperative behavior-  currently denies SI/HI and AVH-  A. Labs and vitals monitored. Pt compliant with medications. Pt supported emotionally and encouraged to express concerns and ask questions.   R. Pt remains safe with 15 minute checks. Will continue POC.

## 2019-10-27 NOTE — Progress Notes (Signed)
[facility] MD Progress Note  10/27/2019 8:09 AM [name]  MRN:  [id number] Subjective:   Patient doing well this morning, resting in bed during visit. Said she was sleeping well. She is not having any SI or AVH on her current medication regimen. Patient OK with discharge if can find placement.  Principal Problem: Schizophrenia, paranoid ([place]) Diagnosis: Principal Problem:   Schizophrenia, paranoid ([place]) Active Problems:   Depressed mood   Psychosis ([place])   MDD (major depressive disorder)  Total Time spent with patient: 20 minutes  Past Psychiatric History: As above  Past Medical History:  Past Medical History:  Diagnosis Date  . Burning with urination 05/03/2015  . Contraceptive management 05/03/2015  . Depression   . Dysmenorrhea 12/30/2013  . Heroin addiction ([place])   . Menorrhagia 12/30/2013  . Menstrual extraction 12/30/2013  . Migraines   . Social anxiety disorder 09/25/2016  . Suicidal ideations   . Vaginal odor 05/03/2015    Past Surgical History:  Procedure Laterality Date  . NO PAST SURGERIES     Family History:  Family History  Problem Relation Age of Onset  . Depression Mother   . Hypertension Father   . Hyperlipidemia Father   . Cancer Paternal Grandmother        breast, uterine  . Cirrhosis Paternal Grandfather        due to alcohol   Family Psychiatric  History: See eval Social History:  Social History   Substance and Sexual Activity  Alcohol Use Yes   Comment: BAC was clear     Social History   Substance and Sexual Activity  Drug Use Yes  . Types: Marijuana, Oxycodone   Comment: reports oxycodone and marijuana    Social History   Socioeconomic History  . Marital status: Single    Spouse name: Not on file  . Number of children: Not on file  . Years of education: Not on file  . Highest education level: Not on file  Occupational History  . Occupation: Unemployed  Tobacco Use  . Smoking status: Current Every Day Smoker    Packs/day: 0.50     Types: Cigarettes  . Smokeless tobacco: Never Used  . Tobacco comment: Not ready to quit  Substance and Sexual Activity  . Alcohol use: Yes    Comment: BAC was clear  . Drug use: Yes    Types: Marijuana, Oxycodone    Comment: reports oxycodone and marijuana  . Sexual activity: Yes    Birth control/protection: Implant  Other Topics Concern  . Not on file  Social History Narrative   06/23/2019:  Pt stated that she is homeless, that she is a high school graduate, and that she is unemployed and not followed by any outpatient provider.      Lives with Dad. Mom has LGD, lives in [place]. 11th grader. Dog.   Social Determinants of Health   Financial Resource Strain:   . Difficulty of Paying Living Expenses:   Food Insecurity:   . Worried About Charity fundraiser in the Last Year:   . Arboriculturist in the Last Year:   Transportation Needs:   . Film/video editor (Medical):   [name] Lack of Transportation (Non-Medical):   Physical Activity:   . Days of Exercise per Week:   . Minutes of Exercise per Session:   Stress:   . Feeling of Stress :   Social Connections:   . Frequency of Communication with Friends and Family:   .  Frequency of Social Gatherings with Friends and Family:   . Attends Religious Services:   . Active Member of Clubs or Organizations:   . Attends Archivist Meetings:   [name] Marital Status:    Additional Social History:    Pain Medications: Please see MAR Prescriptions: Please see MAR, prozac and cogentin per pt Over the Counter: Please see MAR History of alcohol / drug use?: Yes Longest period of sobriety (when/how long): Unknown Negative Consequences of Use: Legal Name of Substance 1: alcohol 1 - Frequency: UTA 1 - Duration: UTA- pt denies 1 - Last Use / Amount: UTA                  Sleep: Good  Appetite:  Good  Current Medications: Current Facility-Administered Medications  Medication Dose Route Frequency Provider Last Rate Last  Admin  . acetaminophen (TYLENOL) tablet 650 mg  650 mg Oral Q6H PRN Emmaline Kluver, FNP      . alum & mag hydroxide-simeth (MAALOX/MYLANTA) 200-200-20 MG/5ML suspension 30 mL  30 mL Oral Q4H PRN Emmaline Kluver, FNP      . benztropine (COGENTIN) tablet 1 mg  1 mg Oral BID PRN Cobos, Myer Peer, MD      . FLUoxetine (PROZAC) capsule 40 mg  40 mg Oral Daily Cobos, Myer Peer, MD   40 mg at 10/26/19 0849  . gabapentin (NEURONTIN) capsule 100 mg  100 mg Oral TID Cobos, Myer Peer, MD   100 mg at 10/26/19 1704  . haloperidol (HALDOL) tablet 5 mg  5 mg Oral Q6H PRN Cobos, Myer Peer, MD   5 mg at 10/25/19 1432   Or  . haloperidol lactate (HALDOL) injection 5 mg  5 mg Intramuscular Q6H PRN Cobos, Myer Peer, MD      . LORazepam (ATIVAN) tablet 2 mg  2 mg Oral Q6H PRN Cobos, Myer Peer, MD   2 mg at 10/25/19 1432   Or  . LORazepam (ATIVAN) injection 2 mg  2 mg Intramuscular Q6H PRN Cobos, Fernando A, MD      . magnesium hydroxide (MILK OF MAGNESIA) suspension 30 mL  30 mL Oral Daily PRN Emmaline Kluver, FNP      . nicotine polacrilex (NICORETTE) gum 2 mg  2 mg Oral PRN Cobos, Myer Peer, MD   2 mg at 10/26/19 1441  . paliperidone (INVEGA) 24 hr tablet 9 mg  9 mg Oral Daily [name], MD   9 mg at 10/26/19 0849  . sulfamethoxazole-trimethoprim (BACTRIM DS) 800-160 MG per tablet 1 tablet  1 tablet Oral Q12H Sharma Covert, MD   1 tablet at 10/26/19 2106  . traZODone (DESYREL) tablet 50 mg  50 mg Oral QHS PRN [name], NP   50 mg at 10/26/19 2106    Lab Results: No results found for this or any previous visit (from the past 48 hour(s)).  Blood Alcohol level:  Lab Results  Component Value Date   ETH <10 10/16/2019   ETH <10 123456    Metabolic Disorder Labs: Lab Results  Component Value Date   HGBA1C 5.4 10/16/2019   MPG 108.28 10/16/2019   MPG 102.54 05/17/2019   Lab Results  Component Value Date   PROLACTIN 152.0 (H) 10/16/2019   PROLACTIN 31.9 (H) 02/17/2019   Lab Results   Component Value Date   CHOL 138 10/16/2019   TRIG 60 10/16/2019   HDL 36 (L) 10/16/2019   CHOLHDL 3.8 10/16/2019   VLDL  12 10/16/2019   LDLCALC 90 10/16/2019   LDLCALC 96 05/17/2019    Physical Findings: AIMS: Facial and Oral Movements Muscles of Facial Expression: None, normal Lips and Perioral Area: None, normal Jaw: None, normal Tongue: None, normal,Extremity Movements Upper (arms, wrists, hands, fingers): None, normal Lower (legs, knees, ankles, toes): None, normal, Trunk Movements Neck, shoulders, hips: None, normal, Overall Severity Severity of abnormal movements (highest score from questions above): None, normal Incapacitation due to abnormal movements: None, normal Patient's awareness of abnormal movements (rate only patient's report): No Awareness, Dental Status Current problems with teeth and/or dentures?: No Does patient usually wear dentures?: No  CIWA:  CIWA-Ar Total: 1 COWS:  COWS Total Score: 2  Musculoskeletal: Strength & Muscle Tone: within normal limits Gait & Station: normal Patient leans: N/A  Psychiatric Specialty Exam: Physical Exam  Review of Systems  Blood pressure 115/78, pulse 97, temperature 98.5 F (36.9 C), temperature source Oral, resp. rate 16, height 5\' 3"  (1.6 m), weight 67.8 kg, SpO2 98 %.Body mass index is 26.48 kg/m.  General Appearance: Casual  Eye Contact:  Poor  Speech:  Slow  Volume:  Decreased  Mood:  Dysphoric  Affect:  Blunt  Thought Process:  Goal Directed  Orientation:  Full (Time, Place, and Person)  Thought Content:  Logical  Suicidal Thoughts:  No  Homicidal Thoughts:  No  Memory:  Not assessed  Judgement:  Fair  Insight:  Fair  Psychomotor Activity:  Normal  Concentration:  Not assessed  Recall:  Not assessed  Fund of Knowledge:  Not assessed  Language:  Good  Akathisia:  Negative  Handed:  Right  AIMS (if indicated):     Assets:  Physical Health Resilience Social Support  ADL's:  Intact  Cognition:   WNL  Sleep:  Number of Hours: 8     Treatment Plan Summary: Daily contact with patient to assess and evaluate symptoms and progress in treatment and Medication management  Plan to continue current medication regimen and monitor for safety. Continue therapies and illness education as patient awaits a placement for discharge.   Casimiro Needle, Medical Student 10/27/2019, 8:09 AM

## 2019-10-27 NOTE — Progress Notes (Signed)
   10/27/19 2100  Psych Admission Type (Psych Patients Only)  Admission Status Voluntary  Psychosocial Assessment  Patient Complaints Depression  Eye Contact Fair  Facial Expression Blank;Flat  Affect Appropriate to circumstance  Speech Logical/coherent  Interaction Assertive  Motor Activity Slow  Appearance/Hygiene Unremarkable  Behavior Characteristics Cooperative  Mood Depressed;Pleasant  Thought Process  Coherency WDL  Content WDL  Delusions None reported or observed  Perception WDL  Hallucination None reported or observed  Judgment WDL  Confusion None  Danger to Self  Current suicidal ideation? Denies  Self-Injurious Behavior No self-injurious ideation or behavior indicators observed or expressed   Agreement Not to Harm Self Yes  Description of Agreement verbal  Danger to Others  Danger to Others None reported or observed

## 2019-10-27 NOTE — BHH Counselor (Signed)
CSW and [facility] Supervisor expect to have a WebEx meeting with [facility]T Supervisor tomorrow (04/14) to coordinate discharge planning.  [name], MSW, LCSW-A Clinical Social Worker [facility]

## 2019-10-27 NOTE — Tx Team (Signed)
Interdisciplinary Treatment and Diagnostic Plan Update  10/27/2019 Time of Session: 8:45am  Julia [name] MRN: [id number]  Principal Diagnosis: Schizophrenia, paranoid [facility])  Secondary Diagnoses: Principal Problem:   Schizophrenia, paranoid ([place]) Active Problems:   Depressed mood   Psychosis ([place])   MDD (major depressive disorder)   Current Medications:  Current Facility-Administered Medications  Medication Dose Route Frequency Provider Last Rate Last Admin  . acetaminophen (TYLENOL) tablet 650 mg  650 mg Oral Q6H PRN Emmaline Kluver, FNP      . alum & mag hydroxide-simeth (MAALOX/MYLANTA) 200-200-20 MG/5ML suspension 30 mL  30 mL Oral Q4H PRN Emmaline Kluver, FNP      . benztropine (COGENTIN) tablet 1 mg  1 mg Oral BID PRN Cobos, Myer Peer, MD      . FLUoxetine (PROZAC) capsule 40 mg  40 mg Oral Daily Cobos, Myer Peer, MD   40 mg at 10/27/19 0853  . gabapentin (NEURONTIN) capsule 100 mg  100 mg Oral TID Cobos, Myer Peer, MD   100 mg at 10/27/19 0854  . haloperidol (HALDOL) tablet 5 mg  5 mg Oral Q6H PRN Cobos, Myer Peer, MD   5 mg at 10/25/19 1432   Or  . haloperidol lactate (HALDOL) injection 5 mg  5 mg Intramuscular Q6H PRN Cobos, Myer Peer, MD      . LORazepam (ATIVAN) tablet 2 mg  2 mg Oral Q6H PRN Cobos, Myer Peer, MD   2 mg at 10/25/19 1432   Or  . LORazepam (ATIVAN) injection 2 mg  2 mg Intramuscular Q6H PRN Cobos, Fernando A, MD      . magnesium hydroxide (MILK OF MAGNESIA) suspension 30 mL  30 mL Oral Daily PRN Emmaline Kluver, FNP      . nicotine polacrilex (NICORETTE) gum 2 mg  2 mg Oral PRN Cobos, Myer Peer, MD   2 mg at 10/26/19 1441  . paliperidone (INVEGA) 24 hr tablet 9 mg  9 mg Oral Daily [name], MD   9 mg at 10/27/19 0854  . sulfamethoxazole-trimethoprim (BACTRIM DS) 800-160 MG per tablet 1 tablet  1 tablet Oral Q12H Sharma Covert, MD   1 tablet at 10/27/19 0854  . traZODone (DESYREL) tablet 50 mg  50 mg Oral QHS PRN [name], NP   50 mg at  10/26/19 2106   PTA Medications: Medications Prior to Admission  Medication Sig Dispense Refill Last Dose  . ibuprofen (ADVIL) 400 MG tablet Take 400 mg by mouth every 6 (six) hours as needed for headache or mild pain.     . benztropine (COGENTIN) 1 MG tablet Take 1 tablet (1 mg total) by mouth 2 (two) times daily. (Patient not taking: Reported on 10/16/2019) 60 tablet 1 Not Taking at Unknown time  . FLUoxetine (PROZAC) 20 MG capsule Take 1 capsule (20 mg total) by mouth daily. (Patient not taking: Reported on 10/16/2019) 30 capsule 1 Not Taking at Unknown time  . gabapentin (NEURONTIN) 400 MG capsule Take 1 capsule (400 mg total) by mouth 3 (three) times daily. (Patient not taking: Reported on 10/16/2019) 90 capsule 1 Not Taking at Unknown time  . haloperidol decanoate (HALDOL DECANOATE) 100 MG/ML injection Inject 0.5 mLs (50 mg total) into the muscle every 30 (thirty) days. NEXT DOSE DUE AROUND September 15, 2019 (Patient not taking: Reported on 10/16/2019) 1 mL 1 Not Taking at Unknown time  . paliperidone (INVEGA SUSTENNA) 156 MG/ML SUSY injection Inject 1 mL (156 mg total) into the  muscle every 30 (thirty) days. NEXT DOSE DUE AROUND September 15, 2019 (Patient not taking: Reported on 10/16/2019) 1.2 mL 1 Not Taking at Unknown time  . paliperidone (INVEGA) 3 MG 24 hr tablet Take 1 tablet (3 mg total) by mouth daily. (Patient not taking: Reported on 10/16/2019) 30 tablet 1 Not Taking at Unknown time  . traZODone (DESYREL) 50 MG tablet Take 1 tablet (50 mg total) by mouth at bedtime. (Patient not taking: Reported on 10/16/2019) 30 tablet 1 Not Taking at Unknown time    Patient Stressors:    Patient Strengths:    Treatment Modalities: Medication Management, Group therapy, Case management,  1 to 1 session with clinician, Psychoeducation, Recreational therapy.   Physician Treatment Plan for Primary Diagnosis: Schizophrenia, paranoid ([place]) Long Term Goal(s): Improvement in symptoms so as ready for  discharge Improvement in symptoms so as ready for discharge   Short Term Goals: Ability to identify changes in lifestyle to reduce recurrence of condition will improve Ability to verbalize feelings will improve Ability to disclose and discuss suicidal ideas Ability to demonstrate self-control will improve Ability to identify and develop effective coping behaviors will improve Ability to maintain clinical measurements within normal limits will improve Compliance with prescribed medications will improve Ability to identify changes in lifestyle to reduce recurrence of condition will improve Ability to verbalize feelings will improve Ability to disclose and discuss suicidal ideas Ability to demonstrate self-control will improve Ability to identify and develop effective coping behaviors will improve Ability to maintain clinical measurements within normal limits will improve Compliance with prescribed medications will improve  Medication Management: Evaluate patient's response, side effects, and tolerance of medication regimen.  Therapeutic Interventions: 1 to 1 sessions, Unit Group sessions and Medication administration.  Evaluation of Outcomes: Progressing  Physician Treatment Plan for Secondary Diagnosis: Principal Problem:   Schizophrenia, paranoid ([place]) Active Problems:   Depressed mood   Psychosis ([place])   MDD (major depressive disorder)  Long Term Goal(s): Improvement in symptoms so as ready for discharge Improvement in symptoms so as ready for discharge   Short Term Goals: Ability to identify changes in lifestyle to reduce recurrence of condition will improve Ability to verbalize feelings will improve Ability to disclose and discuss suicidal ideas Ability to demonstrate self-control will improve Ability to identify and develop effective coping behaviors will improve Ability to maintain clinical measurements within normal limits will improve Compliance with prescribed  medications will improve Ability to identify changes in lifestyle to reduce recurrence of condition will improve Ability to verbalize feelings will improve Ability to disclose and discuss suicidal ideas Ability to demonstrate self-control will improve Ability to identify and develop effective coping behaviors will improve Ability to maintain clinical measurements within normal limits will improve Compliance with prescribed medications will improve     Medication Management: Evaluate patient's response, side effects, and tolerance of medication regimen.  Therapeutic Interventions: 1 to 1 sessions, Unit Group sessions and Medication administration.  Evaluation of Outcomes: Progressing   RN Treatment Plan for Primary Diagnosis: Schizophrenia, paranoid ([place]) Long Term Goal(s): Knowledge of disease and therapeutic regimen to maintain health will improve  Short Term Goals: Ability to participate in decision making will improve, Ability to verbalize feelings will improve, Ability to disclose and discuss suicidal ideas, Ability to identify and develop effective coping behaviors will improve and Compliance with prescribed medications will improve  Medication Management: RN will administer medications as ordered by provider, will assess and evaluate patient's response and provide education to patient for prescribed  medication. RN will report any adverse and/or side effects to prescribing provider.  Therapeutic Interventions: 1 on 1 counseling sessions, Psychoeducation, Medication administration, Evaluate responses to treatment, Monitor vital signs and CBGs as ordered, Perform/monitor CIWA, COWS, AIMS and Fall Risk screenings as ordered, Perform wound care treatments as ordered.  Evaluation of Outcomes: Progressing   LCSW Treatment Plan for Primary Diagnosis: Schizophrenia, paranoid ([place]) Long Term Goal(s): Safe transition to appropriate next level of care at discharge, Engage patient in  therapeutic group addressing interpersonal concerns.  Short Term Goals: Engage patient in aftercare planning with referrals and resources and Increase skills for wellness and recovery  Therapeutic Interventions: Assess for all discharge needs, 1 to 1 time with Social worker, Explore available resources and support systems, Assess for adequacy in community support network, Educate family and significant other(s) on suicide prevention, Complete Psychosocial Assessment, Interpersonal group therapy.  Evaluation of Outcomes: Progressing   Progress in Treatment: Attending groups: No. Participating in groups: No. Taking medication as prescribed: Yes. Toleration medication: Yes. Family/Significant other contact made: Yes, individual(s) contacted:  with pt's father  Patient understands diagnosis: Yes. Discussing patient identified problems/goals with staff: Yes. Medical problems stabilized or resolved: Yes. Denies suicidal/homicidal ideation: Yes. Issues/concerns per patient self-inventory: No. Other:   New problem(s) identified: Yes, Describe:  group home placement   New Short Term/Long Term Goal(s): Medication stabilization, elimination of SI thoughts, and development of a comprehensive mental wellness plan.   Patient Goals:    Discharge Plan or Barriers: CSW will continue to follow up for appropriate referrals and possible discharge planning  Reason for Continuation of Hospitalization: Anxiety Delusions  Medication stabilization  Estimated Length of Stay: TBD  Attendees: Patient:  10/22/2019   Physician: Dr. Jake Samples, MD 10/22/2019   Nursing: Gilberto Better., RN 10/22/2019   RN Care Manager: 10/22/2019   Social Worker: Lurline Idol, LCSW  10/22/2019   Recreational Therapist:  10/22/2019   Other: Ovidio Kin, MSW intern  10/22/2019   Other:  10/22/2019   Other: 10/22/2019      Scribe for Treatment Team: Joellen Jersey, [place] 10/27/2019 9:53 AM

## 2019-10-27 NOTE — BHH Group Notes (Signed)
LCSW Group Therapy Notes 10/27/2019 1:40 PM  Type of Therapy and Topic: Group Therapy: Overcoming Obstacles  Participation Level: Did Not Attend  Description of Group:  In this group patients will be encouraged to explore what they see as obstacles to their own wellness and recovery. They will be guided to discuss their thoughts, feelings, and behaviors related to these obstacles. The group will process together ways to cope with barriers, with attention given to specific choices patients can make. Each patient will be challenged to identify changes they are motivated to make in order to overcome their obstacles. This group will be process-oriented, with patients participating in exploration of their own experiences as well as giving and receiving support and challenge from other group members.  Therapeutic Goals: 1. Patient will identify personal and current obstacles as they relate to admission. 2. Patient will identify barriers that currently interfere with their wellness or overcoming obstacles.  3. Patient will identify feelings, thought process and behaviors related to these barriers. 4. Patient will identify two changes they are willing to make to overcome these obstacles:   Summary of Patient Progress Sleeping, did not attend.   Therapeutic Modalities:  Cognitive Behavioral Therapy Solution Focused Therapy Motivational Interviewing Relapse Prevention Therapy  [name], MSW, LCSWA 10/27/2019 1:40 PM

## 2019-10-28 DIAGNOSIS — R4589 Other symptoms and signs involving emotional state: Secondary | ICD-10-CM | POA: Diagnosis not present

## 2019-10-28 NOTE — BHH Counselor (Signed)
CSW, TOC Supervisor Lurline Idol, and [name] from [place] are scheduled to meet via WebEx at 2:30pm to discuss disposition and potential discharge plans.  [name], MSW, LCSW-A Clinical Social Worker [facility]

## 2019-10-28 NOTE — Plan of Care (Signed)
Progress note  D: pt found in bed; compliant with medication administration. Pt denies any physical complaints. Pt continues to be minimal and guarded. Pt is pleasant though. Pt has had withdrawal symptoms from tobacco. Pt is waiting on placement. Pt is pleasant. Pt denies si/hi/ah/vh and verbally agrees to approach staff if these become apparent or before harming themself/others while at [place].  A: Pt provided support and encouragement. Pt given medication per protocol and standing orders. Q81m safety checks implemented and continued.  R: Pt safe on the unit. Will continue to monitor.  Pt progressing in the following metrics  Problem: Physical Regulation: Goal: Ability to maintain clinical measurements within normal limits will improve Outcome: Progressing   Problem: Safety: Goal: Periods of time without injury will increase Outcome: Progressing

## 2019-10-28 NOTE — Progress Notes (Signed)
   10/28/19 2100  Psych Admission Type (Psych Patients Only)  Admission Status Voluntary  Psychosocial Assessment  Patient Complaints None  Eye Contact Fair  Facial Expression Blank;Flat  Affect Appropriate to circumstance  Speech Logical/coherent  Interaction Assertive  Motor Activity Slow  Appearance/Hygiene Unremarkable  Behavior Characteristics Cooperative  Mood Pleasant  Thought Process  Coherency WDL  Content WDL  Delusions None reported or observed  Perception WDL  Hallucination None reported or observed  Judgment WDL  Confusion None  Danger to Self  Current suicidal ideation? Denies  Self-Injurious Behavior No self-injurious ideation or behavior indicators observed or expressed   Agreement Not to Harm Self Yes  Description of Agreement verbal  Danger to Others  Danger to Others None reported or observed

## 2019-10-28 NOTE — Progress Notes (Signed)
[facility] MD Progress Note  10/28/2019 9:58 AM Julia [name]  MRN:  [id number] Subjective:    Julia [name] remains in bed for rounds she is overall cooperative today although passive she denies current auditory or visual hallucinations or paranoid thoughts is awaiting placement no EPS or TD  Principal Problem: Schizophrenia, paranoid ([place]) Diagnosis: Principal Problem:   Schizophrenia, paranoid ([place]) Active Problems:   Depressed mood   Psychosis ([place])   MDD (major depressive disorder)  Total Time spent with patient: 20 minutes  Past Psychiatric History: no new info  Past Medical History:  Past Medical History:  Diagnosis Date  . Burning with urination 05/03/2015  . Contraceptive management 05/03/2015  . Depression   . Dysmenorrhea 12/30/2013  . Heroin addiction ([place])   . Menorrhagia 12/30/2013  . Menstrual extraction 12/30/2013  . Migraines   . Social anxiety disorder 09/25/2016  . Suicidal ideations   . Vaginal odor 05/03/2015    Past Surgical History:  Procedure Laterality Date  . NO PAST SURGERIES     Family History:  Family History  Problem Relation Age of Onset  . Depression Mother   . Hypertension Father   . Hyperlipidemia Father   . Cancer Paternal Grandmother        breast, uterine  . Cirrhosis Paternal Grandfather        due to alcohol   Family Psychiatric  History: no new info Social History:  Social History   Substance and Sexual Activity  Alcohol Use Yes   Comment: BAC was clear     Social History   Substance and Sexual Activity  Drug Use Yes  . Types: Marijuana, Oxycodone   Comment: reports oxycodone and marijuana    Social History   Socioeconomic History  . Marital status: Single    Spouse name: Not on file  . Number of children: Not on file  . Years of education: Not on file  . Highest education level: Not on file  Occupational History  . Occupation: Unemployed  Tobacco Use  . Smoking status: Current Every Day Smoker    Packs/day: 0.50     Types: Cigarettes  . Smokeless tobacco: Never Used  . Tobacco comment: Not ready to quit  Substance and Sexual Activity  . Alcohol use: Yes    Comment: BAC was clear  . Drug use: Yes    Types: Marijuana, Oxycodone    Comment: reports oxycodone and marijuana  . Sexual activity: Yes    Birth control/protection: Implant  Other Topics Concern  . Not on file  Social History Narrative   06/23/2019:  Pt stated that she is homeless, that she is a high school graduate, and that she is unemployed and not followed by any outpatient provider.      Lives with Dad. Mom has LGD, lives in [place]. 11th grader. Dog.   Social Determinants of Health   Financial Resource Strain:   . Difficulty of Paying Living Expenses:   Food Insecurity:   . Worried About Charity fundraiser in the Last Year:   . Arboriculturist in the Last Year:   Transportation Needs:   . Film/video editor (Medical):   [name] Lack of Transportation (Non-Medical):   Physical Activity:   . Days of Exercise per Week:   . Minutes of Exercise per Session:   Stress:   . Feeling of Stress :   Social Connections:   . Frequency of Communication with Friends and Family:   . Frequency of  Social Gatherings with Friends and Family:   . Attends Religious Services:   . Active Member of Clubs or Organizations:   . Attends Archivist Meetings:   [name] Marital Status:    Additional Social History:    Pain Medications: Please see MAR Prescriptions: Please see MAR, prozac and cogentin per pt Over the Counter: Please see MAR History of alcohol / drug use?: Yes Longest period of sobriety (when/how long): Unknown Negative Consequences of Use: Legal Name of Substance 1: alcohol 1 - Frequency: UTA 1 - Duration: UTA- pt denies 1 - Last Use / Amount: UTA                  Sleep: Fair  Appetite:  Fair  Current Medications: Current Facility-Administered Medications  Medication Dose Route Frequency Provider Last Rate Last Admin   . acetaminophen (TYLENOL) tablet 650 mg  650 mg Oral Q6H PRN Emmaline Kluver, FNP      . alum & mag hydroxide-simeth (MAALOX/MYLANTA) 200-200-20 MG/5ML suspension 30 mL  30 mL Oral Q4H PRN Emmaline Kluver, FNP      . benztropine (COGENTIN) tablet 1 mg  1 mg Oral BID PRN Cobos, Myer Peer, MD   1 mg at 10/27/19 1644  . FLUoxetine (PROZAC) capsule 40 mg  40 mg Oral Daily Cobos, Myer Peer, MD   40 mg at 10/28/19 0849  . gabapentin (NEURONTIN) capsule 100 mg  100 mg Oral TID Cobos, Myer Peer, MD   100 mg at 10/28/19 0848  . haloperidol (HALDOL) tablet 5 mg  5 mg Oral Q6H PRN Cobos, Myer Peer, MD   5 mg at 10/25/19 1432   Or  . haloperidol lactate (HALDOL) injection 5 mg  5 mg Intramuscular Q6H PRN Cobos, Myer Peer, MD      . LORazepam (ATIVAN) tablet 2 mg  2 mg Oral Q6H PRN Cobos, Myer Peer, MD   2 mg at 10/25/19 1432   Or  . LORazepam (ATIVAN) injection 2 mg  2 mg Intramuscular Q6H PRN Cobos, Fernando A, MD      . magnesium hydroxide (MILK OF MAGNESIA) suspension 30 mL  30 mL Oral Daily PRN Emmaline Kluver, FNP      . nicotine polacrilex (NICORETTE) gum 2 mg  2 mg Oral PRN Cobos, Myer Peer, MD   2 mg at 10/28/19 0948  . paliperidone (INVEGA) 24 hr tablet 9 mg  9 mg Oral Daily [name], MD   9 mg at 10/28/19 0849  . sulfamethoxazole-trimethoprim (BACTRIM DS) 800-160 MG per tablet 1 tablet  1 tablet Oral Q12H Sharma Covert, MD   1 tablet at 10/28/19 0848  . traZODone (DESYREL) tablet 50 mg  50 mg Oral QHS PRN [name], NP   50 mg at 10/27/19 2115    Lab Results: No results found for this or any previous visit (from the past 48 hour(s)).  Blood Alcohol level:  Lab Results  Component Value Date   ETH <10 10/16/2019   ETH <10 123456    Metabolic Disorder Labs: Lab Results  Component Value Date   HGBA1C 5.4 10/16/2019   MPG 108.28 10/16/2019   MPG 102.54 05/17/2019   Lab Results  Component Value Date   PROLACTIN 152.0 (H) 10/16/2019   PROLACTIN 31.9 (H) 02/17/2019    Lab Results  Component Value Date   CHOL 138 10/16/2019   TRIG 60 10/16/2019   HDL 36 (L) 10/16/2019   CHOLHDL 3.8 10/16/2019  VLDL 12 10/16/2019   LDLCALC 90 10/16/2019   LDLCALC 96 05/17/2019    Physical Findings: AIMS: Facial and Oral Movements Muscles of Facial Expression: None, normal Lips and Perioral Area: None, normal Jaw: None, normal Tongue: None, normal,Extremity Movements Upper (arms, wrists, hands, fingers): None, normal Lower (legs, knees, ankles, toes): None, normal, Trunk Movements Neck, shoulders, hips: None, normal, Overall Severity Severity of abnormal movements (highest score from questions above): None, normal Incapacitation due to abnormal movements: None, normal Patient's awareness of abnormal movements (rate only patient's report): No Awareness, Dental Status Current problems with teeth and/or dentures?: No Does patient usually wear dentures?: No  CIWA:  CIWA-Ar Total: 1 COWS:  COWS Total Score: 2  Musculoskeletal: Strength & Muscle Tone: within normal limits Gait & Station: normal Patient leans: N/A  Psychiatric Specialty Exam: Physical Exam  Review of Systems  Blood pressure 115/78, pulse 97, temperature 98.5 F (36.9 C), temperature source Oral, resp. rate 16, height 5\' 3"  (1.6 m), weight 67.8 kg, SpO2 98 %.Body mass index is 26.48 kg/m.  General Appearance: Casual  Eye Contact:  Minimal  Speech:  Clear and Coherent  Volume:  Decreased  Mood:  Dysphoric  Affect:  Restricted  Thought Process:  Coherent and Goal Directed  Orientation:  Full (Time, Place, and Person)  Thought Content:  Rumination  Suicidal Thoughts:  No  Homicidal Thoughts:  No  Memory:  Immediate;   Fair Recent;   Fair Remote;   Fair  Judgement:  Fair  Insight:  Fair  Psychomotor Activity:  Normal  Concentration:  Concentration: Fair and Attention Span: Fair  Recall:  [employer] of Knowledge:  Fair  Language:  Fair  Akathisia:  Negative  Handed:  Right  AIMS  (if indicated):     Assets:  Leisure Time Physical Health  ADL's:  Intact  Cognition:  WNL  Sleep:  Number of Hours: 6.75   Treatment Plan Summary: Daily contact with patient to assess and evaluate symptoms and progress in treatment and Medication management Continue Invega continue reality based therapy no change in precautions   Julia Manz, MD 10/28/2019, 9:58 AM

## 2019-10-28 NOTE — BHH Counselor (Signed)
CSW, TOC Supervisor Marya Amsler, and [name] from patient's ACT Team completed a conference call to discuss discharge planning. APS has closed their cases and is not recommending guardianship for this patient.  CSW and ACT Team will collaborate to locate a shelter placement for patient, the barrier at this time being the lack of photo ID.  ACTT reports patient has applications for SSI and SSI Survivor Benefits. The Survivor Benefits are a larger amount of money than the SSI, and patient would be eligible for Survivor Benefits until she turns 30 in March 2022.  CSW and patient met and called the [facility] [phone]). Over the course of 45 minutes, it was determined that patient has three separate applications on file and a representative reviewed the applications with patient and CSW.  1.) Patient's application for SSI was denied. A letter was mailed on 04/02 with this decision. 2.) Patient's application for "student benefits" is still processing. 3.) Patient's application for SSI benefits under her father is still pending. This application was received on 03/26. There is not an estimated decision date. Of note, patients mother's information was not included in this application. There is not an application for Survivor's Benefits. Patient was able to provide her deceased mother's name and date of birth to the representative, but not her mother's SSN. The representative stated she would forward correspondence to the local [place] office to follow up with patient.  Patient is aware that she will likely discharge to a shelter and continue services with her ACT Team. Patient is agreeable to plan and hopes to start receiving SSI and enter a new group home at a later date.  [name], MSW, LCSW-A Clinical Social Worker [facility]

## 2019-10-29 DIAGNOSIS — R4589 Other symptoms and signs involving emotional state: Secondary | ICD-10-CM | POA: Diagnosis not present

## 2019-10-29 NOTE — Progress Notes (Signed)
SPIRITUALITY GROUP NOTE  Spirituality group facilitated by Simone Curia, MDiv, [place].  Group Description:  Group focused on topic of hope.  Patients participated in facilitated discussion around topic, connecting with one another around experiences and definitions for hope.  Group members engaged with visual explorer photos, reflecting on what hope looks like for them today.  Group engaged in discussion around how their definitions of hope are present today in hospital.   Modalities: Psycho-social ed, Adlerian, Narrative, [place] Patient Progress:  Minna arrived at group late.  Was able to give one-word answer related to topic.  Stated she finds hope in sunshine.  When facilitator explored this with her she responded "I don't know."   She later stated she likes to walk on trails and picked a picture of a trail in the visual explorer exercise.

## 2019-10-29 NOTE — Progress Notes (Signed)
   10/29/19 2110  Psych Admission Type (Psych Patients Only)  Admission Status Voluntary  Psychosocial Assessment  Patient Complaints None  Eye Contact Fair  Facial Expression Blank;Flat  Affect Appropriate to circumstance  Speech Logical/coherent  Interaction Assertive  Motor Activity Slow  Appearance/Hygiene Unremarkable  Behavior Characteristics Appropriate to situation;Cooperative  Mood Pleasant;Depressed  Thought Process  Coherency WDL  Content WDL  Delusions None reported or observed  Perception WDL  Hallucination None reported or observed  Judgment WDL  Confusion None  Danger to Self  Current suicidal ideation? Denies  Self-Injurious Behavior No self-injurious ideation or behavior indicators observed or expressed   Agreement Not to Harm Self Yes  Description of Agreement verbal  Danger to Others  Danger to Others None reported or observed

## 2019-10-29 NOTE — Progress Notes (Signed)
[facility] MD Progress Note  10/29/2019 7:59 AM Julia [name]  MRN:  [id number] Subjective:   Julia [name] is generally stable in mood although seems to have prominent negative symptoms.  She denies auditory and visual hallucination she has had no disruptive behaviors.  We are awaiting placement.  No EPS or akathisia noted.  No TD.  Principal Problem: Schizophrenia, paranoid ([place]) Diagnosis: Principal Problem:   Schizophrenia, paranoid ([place]) Active Problems:   Depressed mood   Psychosis ([place])   MDD (major depressive disorder)  Total Time spent with patient: 20 minutes  Past Psychiatric History: see eval  Past Medical History:  Past Medical History:  Diagnosis Date  . Burning with urination 05/03/2015  . Contraceptive management 05/03/2015  . Depression   . Dysmenorrhea 12/30/2013  . Heroin addiction ([place])   . Menorrhagia 12/30/2013  . Menstrual extraction 12/30/2013  . Migraines   . Social anxiety disorder 09/25/2016  . Suicidal ideations   . Vaginal odor 05/03/2015    Past Surgical History:  Procedure Laterality Date  . NO PAST SURGERIES     Family History:  Family History  Problem Relation Age of Onset  . Depression Mother   . Hypertension Father   . Hyperlipidemia Father   . Cancer Paternal Grandmother        breast, uterine  . Cirrhosis Paternal Grandfather        due to alcohol   Family Psychiatric  History: see eval Social History:  Social History   Substance and Sexual Activity  Alcohol Use Yes   Comment: BAC was clear     Social History   Substance and Sexual Activity  Drug Use Yes  . Types: Marijuana, Oxycodone   Comment: reports oxycodone and marijuana    Social History   Socioeconomic History  . Marital status: Single    Spouse name: Not on file  . Number of children: Not on file  . Years of education: Not on file  . Highest education level: Not on file  Occupational History  . Occupation: Unemployed  Tobacco Use  . Smoking status: Current Every Day  Smoker    Packs/day: 0.50    Types: Cigarettes  . Smokeless tobacco: Never Used  . Tobacco comment: Not ready to quit  Substance and Sexual Activity  . Alcohol use: Yes    Comment: BAC was clear  . Drug use: Yes    Types: Marijuana, Oxycodone    Comment: reports oxycodone and marijuana  . Sexual activity: Yes    Birth control/protection: Implant  Other Topics Concern  . Not on file  Social History Narrative   06/23/2019:  Pt stated that she is homeless, that she is a high school graduate, and that she is unemployed and not followed by any outpatient provider.      Lives with Dad. Mom has LGD, lives in [place]. 11th grader. Dog.   Social Determinants of Health   Financial Resource Strain:   . Difficulty of Paying Living Expenses:   Food Insecurity:   . Worried About Charity fundraiser in the Last Year:   . Arboriculturist in the Last Year:   Transportation Needs:   . Film/video editor (Medical):   [name] Lack of Transportation (Non-Medical):   Physical Activity:   . Days of Exercise per Week:   . Minutes of Exercise per Session:   Stress:   . Feeling of Stress :   Social Connections:   . Frequency of Communication with Friends  and Family:   . Frequency of Social Gatherings with Friends and Family:   . Attends Religious Services:   . Active Member of Clubs or Organizations:   . Attends Archivist Meetings:   [name] Marital Status:    Additional Social History:    Pain Medications: Please see MAR Prescriptions: Please see MAR, prozac and cogentin per pt Over the Counter: Please see MAR History of alcohol / drug use?: Yes Longest period of sobriety (when/how long): Unknown Negative Consequences of Use: Legal Name of Substance 1: alcohol 1 - Frequency: UTA 1 - Duration: UTA- pt denies 1 - Last Use / Amount: UTA                  Sleep: Fair  Appetite:  Fair  Current Medications: Current Facility-Administered Medications  Medication Dose Route Frequency  Provider Last Rate Last Admin  . acetaminophen (TYLENOL) tablet 650 mg  650 mg Oral Q6H PRN Emmaline Kluver, FNP      . alum & mag hydroxide-simeth (MAALOX/MYLANTA) 200-200-20 MG/5ML suspension 30 mL  30 mL Oral Q4H PRN Emmaline Kluver, FNP      . benztropine (COGENTIN) tablet 1 mg  1 mg Oral BID PRN Cobos, Myer Peer, MD   1 mg at 10/27/19 1644  . FLUoxetine (PROZAC) capsule 40 mg  40 mg Oral Daily Cobos, Myer Peer, MD   40 mg at 10/28/19 0849  . gabapentin (NEURONTIN) capsule 100 mg  100 mg Oral TID Cobos, Myer Peer, MD   100 mg at 10/28/19 1637  . haloperidol (HALDOL) tablet 5 mg  5 mg Oral Q6H PRN Cobos, Myer Peer, MD   5 mg at 10/25/19 1432   Or  . haloperidol lactate (HALDOL) injection 5 mg  5 mg Intramuscular Q6H PRN Cobos, Myer Peer, MD      . LORazepam (ATIVAN) tablet 2 mg  2 mg Oral Q6H PRN Cobos, Myer Peer, MD   2 mg at 10/25/19 1432   Or  . LORazepam (ATIVAN) injection 2 mg  2 mg Intramuscular Q6H PRN Cobos, Fernando A, MD      . magnesium hydroxide (MILK OF MAGNESIA) suspension 30 mL  30 mL Oral Daily PRN Emmaline Kluver, FNP      . nicotine polacrilex (NICORETTE) gum 2 mg  2 mg Oral PRN Cobos, Myer Peer, MD   2 mg at 10/28/19 1645  . paliperidone (INVEGA) 24 hr tablet 9 mg  9 mg Oral Daily [name], MD   9 mg at 10/28/19 0849  . sulfamethoxazole-trimethoprim (BACTRIM DS) 800-160 MG per tablet 1 tablet  1 tablet Oral Q12H Sharma Covert, MD   1 tablet at 10/28/19 2045  . traZODone (DESYREL) tablet 50 mg  50 mg Oral QHS PRN [name], NP   50 mg at 10/28/19 2045    Lab Results: No results found for this or any previous visit (from the past 48 hour(s)).  Blood Alcohol level:  Lab Results  Component Value Date   ETH <10 10/16/2019   ETH <10 123456    Metabolic Disorder Labs: Lab Results  Component Value Date   HGBA1C 5.4 10/16/2019   MPG 108.28 10/16/2019   MPG 102.54 05/17/2019   Lab Results  Component Value Date   PROLACTIN 152.0 (H) 10/16/2019    PROLACTIN 31.9 (H) 02/17/2019   Lab Results  Component Value Date   CHOL 138 10/16/2019   TRIG 60 10/16/2019   HDL 36 (L) 10/16/2019  CHOLHDL 3.8 10/16/2019   VLDL 12 10/16/2019   LDLCALC 90 10/16/2019   LDLCALC 96 05/17/2019    Physical Findings: AIMS: Facial and Oral Movements Muscles of Facial Expression: None, normal Lips and Perioral Area: None, normal Jaw: None, normal Tongue: None, normal,Extremity Movements Upper (arms, wrists, hands, fingers): None, normal Lower (legs, knees, ankles, toes): None, normal, Trunk Movements Neck, shoulders, hips: None, normal, Overall Severity Severity of abnormal movements (highest score from questions above): None, normal Incapacitation due to abnormal movements: None, normal Patient's awareness of abnormal movements (rate only patient's report): No Awareness, Dental Status Current problems with teeth and/or dentures?: No Does patient usually wear dentures?: No  CIWA:  CIWA-Ar Total: 1 COWS:  COWS Total Score: 2  Musculoskeletal: Strength & Muscle Tone: within normal limits Gait & Station: normal Patient leans: N/A  Psychiatric Specialty Exam: Physical Exam  Review of Systems  Blood pressure 115/78, pulse 97, temperature 98.5 F (36.9 C), temperature source Oral, resp. rate 16, height 5\' 3"  (1.6 m), weight 67.8 kg, SpO2 98 %.Body mass index is 26.48 kg/m.  General Appearance: Casual  Eye Contact:  Fair  Speech:  Slow  Volume:  Decreased  Mood:  Euthymic  Affect:  Restricted  Thought Process:  Coherent and Goal Directed  Orientation:  Full (Time, Place, and Person)  Thought Content:  Rumination  Suicidal Thoughts:  No  Homicidal Thoughts:  No  Memory:  Immediate;   Fair Recent;   Fair Remote;   Fair  Judgement:  Fair  Insight:  Fair  Psychomotor Activity:  Normal  Concentration:  Concentration: Fair and Attention Span: Fair  Recall:  [employer] of Knowledge:  Fair  Language:  Fair  Akathisia:  Negative  Handed:   Right  AIMS (if indicated):     Assets:  Leisure Time Physical Health Resilience  ADL's:  Intact  Cognition:  WNL  Sleep:  Number of Hours: 7.75     Treatment Plan Summary: Daily contact with patient to assess and evaluate symptoms and progress in treatment and Medication management  Continue current precautions and current med regimen without change it seems to be effective for all positive symptoms, patient seems to have mainly negative symptoms.  Continue to await placement  Aditri Louischarles, MD 10/29/2019, 7:59 AM

## 2019-10-29 NOTE — Progress Notes (Signed)
   10/29/19 0900  Psych Admission Type (Psych Patients Only)  Admission Status Voluntary  Psychosocial Assessment  Patient Complaints None  Eye Contact Fair  Facial Expression Blank;Flat  Affect Appropriate to circumstance  Speech Logical/coherent  Interaction Assertive  Motor Activity Slow  Appearance/Hygiene Unremarkable  Behavior Characteristics Cooperative  Mood Depressed  Thought Process  Coherency WDL  Content WDL  Delusions None reported or observed  Perception WDL  Hallucination None reported or observed  Judgment WDL  Confusion None  Danger to Self  Current suicidal ideation? Denies  Self-Injurious Behavior No self-injurious ideation or behavior indicators observed or expressed   Agreement Not to Harm Self Yes  Description of Agreement verbal  Danger to Others  Danger to Others None reported or observed

## 2019-10-29 NOTE — Progress Notes (Signed)
   10/29/19 2110  COVID-19 Daily Checkoff  Have you had a fever (temp > 37.80C/100F)  in the past 24 hours?  No  If you have had runny nose, nasal congestion, sneezing in the past 24 hours, has it worsened? No  COVID-19 EXPOSURE  Have you traveled outside the state in the past 14 days? No  Have you been in contact with someone with a confirmed diagnosis of COVID-19 or PUI in the past 14 days without wearing appropriate PPE? No  Have you been living in the same home as a person with confirmed diagnosis of COVID-19 or a PUI (household contact)? No  Have you been diagnosed with COVID-19? No

## 2019-10-29 NOTE — BHH Group Notes (Signed)
Balance In Life 10/29/2019 10:30am  Type of Therapy/Topic:  Group Therapy:  Balance in Life  Participation Level:  Minimal  Description of Group:   This group will address the concept of balance and how it feels and looks when one is unbalanced. Patients will be encouraged to process areas in their lives that are out of balance and identify reasons for remaining unbalanced. Facilitators will guide patients in utilizing problem-solving interventions to address and correct the stressor making their life unbalanced. Understanding and applying boundaries will be explored and addressed for obtaining and maintaining a balanced life. Patients will be encouraged to explore ways to assertively make their unbalanced needs known to significant others in their lives, using other group members and facilitator for support and feedback.  Therapeutic Goals: 1. Patient will identify two or more emotions or situations they have that consume much of in their lives. 2. Patient will identify signs/triggers that life has become out of balance:  3. Patient will identify two ways to set boundaries in order to achieve balance in their lives:  4. Patient will demonstrate ability to communicate their needs through discussion and/or role plays  Summary of Patient Progress: Patient attended group. Minimal participation and interaction with group members. Patient states enjoying rap music and how it helps her find balance in life. Patient respected boundaries and sat quietly.   Therapeutic Modalities:   Cognitive Behavioral Therapy Solution-Focused Therapy Assertiveness Training  Lavone Barrientes Lynelle Smoke, LCSW

## 2019-10-30 DIAGNOSIS — F200 Paranoid schizophrenia: Secondary | ICD-10-CM | POA: Diagnosis not present

## 2019-10-30 NOTE — Progress Notes (Signed)
Psychoeducational Group Note  Date:  10/30/2019 Time: 2030  Group Topic/Focus:  wrap up group  Participation Level: Did Not Attend  Participation Quality:  Not Applicable  Affect:  Not Applicable  Cognitive:  Not Applicable  Insight:  Not Applicable  Engagement in Group: Not Applicable  Additional Comments:  Pt was notified that group was beginning but remained in bed.   Shellia Cleverly 10/30/2019, 8:51 PM
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
## 2019-10-30 NOTE — Progress Notes (Signed)
   10/30/19 2027  Psych Admission Type (Psych Patients Only)  Admission Status Voluntary  Psychosocial Assessment  Patient Complaints None  Eye Contact Fair  Facial Expression Flat  Affect Appropriate to circumstance  Speech Logical/coherent  Interaction Assertive  Motor Activity Slow  Appearance/Hygiene Unremarkable  Behavior Characteristics Appropriate to situation  Mood Pleasant  Thought Process  Content WDL  Delusions None reported or observed  Perception WDL  Hallucination None reported or observed  Judgment WDL  Confusion None  Danger to Self  Current suicidal ideation? Denies  Self-Injurious Behavior No self-injurious ideation or behavior indicators observed or expressed   Agreement Not to Harm Self Yes  Description of Agreement verbal  Danger to Others  Danger to Others None reported or observed
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
## 2019-10-30 NOTE — Progress Notes (Signed)
D. Patient moved to [place] without incident. Pt is calm and cooperative- denies SI/HI and A/VH- visible in the dayroom attending group this am A. Labs and vitals monitored. Pt compliant with medications. Pt supported emotionally and encouraged to express concerns and ask questions.   R. Pt remains safe with 15 minute checks. Will continue POC.

## 2019-10-30 NOTE — BHH Group Notes (Signed)
Adult Psychoeducational Group Note  Date:  10/30/2019 Time:  9:44 PM  Group Topic/Focus:  Identifying Needs:   The focus of this group is to help patients identify their personal needs that have been historically problematic and identify healthy behaviors to address their needs.  Participation Level:  Minimal  Participation Quality:  Inattentive  Affect:  Blunted  Cognitive:  Disorganized  Insight: Lacking  Engagement in Group:  Lacking  Modes of Intervention:  Activity and Support  Additional Comments:  Pt did not really participate fully in the group   Bryson Dames A 10/30/2019, 9:44 PM

## 2019-10-31 DIAGNOSIS — R4589 Other symptoms and signs involving emotional state: Secondary | ICD-10-CM | POA: Diagnosis not present

## 2019-10-31 NOTE — BHH Group Notes (Signed)
Adult Psychoeducational Group Note  Date:  10/31/2019 Time:  11:10 AM  Group Topic/Focus:  .  PROGRESSIVE RELAXATION.[name] A group where deep breathing is taught and tensing and relaxation muscle groups is used. Imagery is used as well. Pt;s are asked to imagine 3 pillars that hold them up when they are not able to hold themselves up.  Participation Level:  Active  Participation Quality:  Appropriate  Affect:  Flat  Cognitive:  Alert  Insight: Lacking  Engagement in Group:  Engaged  Modes of Intervention:  Activity  Additional Comments:  Pt participated  in the group. Answered questions . Did not volunteer anything. Stated her 3 pillars are her boyfriend, her father and her brother. Stated that has learned to control her anger.  Bryson Dames A 10/31/2019, 11:10 AM

## 2019-10-31 NOTE — Progress Notes (Signed)
   10/31/19 2008  COVID-19 Daily Checkoff  Have you had a fever (temp > 37.80C/100F)  in the past 24 hours?  No  If you have had runny nose, nasal congestion, sneezing in the past 24 hours, has it worsened? No  COVID-19 EXPOSURE  Have you traveled outside the state in the past 14 days? No  Have you been in contact with someone with a confirmed diagnosis of COVID-19 or PUI in the past 14 days without wearing appropriate PPE? No  Have you been living in the same home as a person with confirmed diagnosis of COVID-19 or a PUI (household contact)? No  Have you been diagnosed with COVID-19? No

## 2019-10-31 NOTE — BHH Group Notes (Signed)
[place] LCSW Group Therapy Note  Date/Time:  10/31/2019 9:00-10:00 or 10:00-11:00AM  Type of Therapy and Topic:  Group Therapy:  Healthy and Unhealthy Supports  Participation Level:  Did Not Attend   Description of Group:  Patients in this group were introduced to the idea of adding a variety of healthy supports to address the various needs in their lives.Patients discussed what additional healthy supports could be helpful in their recovery and wellness after discharge in order to prevent future hospitalizations.   An emphasis was placed on using counselor, doctor, therapy groups, 12-step groups, and problem-specific support groups to expand supports.  Several songs were played to emphasize points made throughout group.  Therapeutic Goals:   1)  discuss importance of adding supports to stay well once out of the hospital  2)  compare healthy versus unhealthy supports and identify some examples of each  3)  generate ideas and descriptions of healthy supports that can be added  4)  offer mutual support about how to address unhealthy supports  5)  encourage active participation in and adherence to discharge plan    Summary of Patient Progress:  The patient did not attend group   Therapeutic Modalities:   Motivational Interviewing Brief Solution-Focused Therapy  Selmer Dominion, LCSW

## 2019-10-31 NOTE — Progress Notes (Addendum)
D. Pt presents with a flat affect, brightens a little during interactions- has been calm and cooperative on the unit- observed attending group led by RN this am- currently denies SI/HI  A. Labs and vitals monitored. Pt compliant with medications. Pt supported emotionally and encouraged to express concerns and ask questions.   R. Pt remains safe with 15 minute checks. Will continue POC.
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
## 2019-10-31 NOTE — Progress Notes (Signed)
EKG results placed on the outside of shadow chart  Sinus tachycardia  Vent rate 111 BPM  Otherwise normal ECG  QT/QTc-   342/465 ms

## 2019-10-31 NOTE — Progress Notes (Signed)
   10/31/19 2016  Psych Admission Type (Psych Patients Only)  Admission Status Voluntary  Psychosocial Assessment  Patient Complaints None  Eye Contact Fair  Facial Expression Flat  Affect Appropriate to circumstance  Speech Logical/coherent  Interaction Minimal  Motor Activity Slow  Appearance/Hygiene Unremarkable  Behavior Characteristics Guarded;Impulsive  Mood Labile  Thought Process  Coherency WDL  Content WDL  Delusions Paranoid  Perception Hallucinations  Hallucination Auditory  Judgment Impaired  Confusion None  Danger to Self  Current suicidal ideation? Denies  Self-Injurious Behavior No self-injurious ideation or behavior indicators observed or expressed   Agreement Not to Harm Self Yes  Description of Agreement verbal  Danger to Others  Danger to Others None reported or observed

## 2019-10-31 NOTE — Progress Notes (Signed)
[facility] MD Progress Note  10/31/2019 11:59 AM Julia [name]  MRN:  [id number] Subjective:  Patient reports " I am OK now".  Objective : I have reviewed chart notes and have met with patient. 19 year old female , admitted on 10/16/19 for depression, SI, psychotic symptoms. Reported hallucinations and thought insertion delusions , describing a man she had never met who was  harassing her mentally and placing ego-dystonic thoughts into her head.  Earlier today patient had an episode of acute agitation, while in the day room. She started screaming at and lunged at  a female peer who was reportedly laughing with another patient. Julia [name] was initiated and staff intervened. She was given PRN medication for acute agitation.  She describes hearing this person insulting her. Staff reports patient had been calm , quiet prior to this incident but had been observed talking to self and possibly  responding to internal stimuli. Following above incident patient has remained calm and was vaguely anxious but cooperative . She reports she heard the peer referenced above insulting her . She does acknowledge persistent psychotic symptoms/ hallucinations.  She has difficulty describing them but states that a man puts thoughts in her head .  Currently she is tolerating medications well, denies side effects. She denies suicidal ideations.   Principal Problem: Schizophrenia, paranoid ([place]) Diagnosis: Principal Problem:   Schizophrenia, paranoid ([place]) Active Problems:   Depressed mood   Psychosis ([place])   MDD (major depressive disorder)  Total Time spent with patient: 20 minutes  Past Psychiatric History: See admission H&P  Past Medical History:  Past Medical History:  Diagnosis Date  . Burning with urination 05/03/2015  . Contraceptive management 05/03/2015  . Depression   . Dysmenorrhea 12/30/2013  . Heroin addiction ([place])   . Menorrhagia 12/30/2013  . Menstrual extraction 12/30/2013  . Migraines   . Social anxiety  disorder 09/25/2016  . Suicidal ideations   . Vaginal odor 05/03/2015    Past Surgical History:  Procedure Laterality Date  . NO PAST SURGERIES     Family History:  Family History  Problem Relation Age of Onset  . Depression Mother   . Hypertension Father   . Hyperlipidemia Father   . Cancer Paternal Grandmother        breast, uterine  . Cirrhosis Paternal Grandfather        due to alcohol   Family Psychiatric  History: See admission H&P Social History:  Social History   Substance and Sexual Activity  Alcohol Use Yes   Comment: BAC was clear     Social History   Substance and Sexual Activity  Drug Use Yes  . Types: Marijuana, Oxycodone   Comment: reports oxycodone and marijuana    Social History   Socioeconomic History  . Marital status: Single    Spouse name: Not on file  . Number of children: Not on file  . Years of education: Not on file  . Highest education level: Not on file  Occupational History  . Occupation: Unemployed  Tobacco Use  . Smoking status: Current Every Day Smoker    Packs/day: 0.50    Types: Cigarettes  . Smokeless tobacco: Never Used  . Tobacco comment: Not ready to quit  Substance and Sexual Activity  . Alcohol use: Yes    Comment: BAC was clear  . Drug use: Yes    Types: Marijuana, Oxycodone    Comment: reports oxycodone and marijuana  . Sexual activity: Yes    Birth control/protection: Implant  Other Topics Concern  . Not on file  Social History Narrative   06/23/2019:  Pt stated that she is homeless, that she is a high school graduate, and that she is unemployed and not followed by any outpatient provider.      Lives with Dad. Mom has LGD, lives in [place]. 11th grader. Dog.   Social Determinants of Health   Financial Resource Strain:   . Difficulty of Paying Living Expenses:   Food Insecurity:   . Worried About Charity fundraiser in the Last Year:   . Arboriculturist in the Last Year:   Transportation Needs:   . Lexicographer (Medical):   [name] Lack of Transportation (Non-Medical):   Physical Activity:   . Days of Exercise per Week:   . Minutes of Exercise per Session:   Stress:   . Feeling of Stress :   Social Connections:   . Frequency of Communication with Friends and Family:   . Frequency of Social Gatherings with Friends and Family:   . Attends Religious Services:   . Active Member of Clubs or Organizations:   . Attends Archivist Meetings:   [name] Marital Status:    Additional Social History:    Pain Medications: Please see MAR Prescriptions: Please see MAR, prozac and cogentin per pt Over the Counter: Please see MAR History of alcohol / drug use?: Yes Longest period of sobriety (when/how long): Unknown Negative Consequences of Use: Legal Name of Substance 1: alcohol 1 - Frequency: UTA 1 - Duration: UTA- pt denies 1 - Last Use / Amount: UTA  Sleep: Good  Appetite:  Good  Current Medications: Current Facility-Administered Medications  Medication Dose Route Frequency Provider Last Rate Last Admin  . acetaminophen (TYLENOL) tablet 650 mg  650 mg Oral Q6H PRN Emmaline Kluver, FNP      . alum & mag hydroxide-simeth (MAALOX/MYLANTA) 200-200-20 MG/5ML suspension 30 mL  30 mL Oral Q4H PRN Emmaline Kluver, FNP      . benztropine (COGENTIN) tablet 1 mg  1 mg Oral BID PRN Dionisia Pacholski, Myer Peer, MD   1 mg at 10/27/19 1644  . FLUoxetine (PROZAC) capsule 40 mg  40 mg Oral Daily Maryl Blalock, Myer Peer, MD   40 mg at 10/31/19 0847  . gabapentin (NEURONTIN) capsule 100 mg  100 mg Oral TID Cele Mote, Myer Peer, MD   100 mg at 10/31/19 1108  . haloperidol (HALDOL) tablet 5 mg  5 mg Oral Q6H PRN Fransico Sciandra, Myer Peer, MD   5 mg at 10/25/19 1432   Or  . haloperidol lactate (HALDOL) injection 5 mg  5 mg Intramuscular Q6H PRN Emanuel Dowson, Myer Peer, MD      . LORazepam (ATIVAN) tablet 2 mg  2 mg Oral Q6H PRN Audia Amick, Myer Peer, MD   2 mg at 10/25/19 1432   Or  . LORazepam (ATIVAN) injection 2 mg  2 mg Intramuscular Q6H  PRN Letitia Sabala, Myer Peer, MD   2 mg at 10/31/19 1018  . magnesium hydroxide (MILK OF MAGNESIA) suspension 30 mL  30 mL Oral Daily PRN Emmaline Kluver, FNP      . nicotine polacrilex (NICORETTE) gum 2 mg  2 mg Oral PRN Izaias Krupka, Myer Peer, MD   2 mg at 10/31/19 1109  . paliperidone (INVEGA) 24 hr tablet 9 mg  9 mg Oral Daily [name], MD   9 mg at 10/31/19 0847  . traZODone (DESYREL) tablet 50 mg  50 mg  Oral QHS PRN [name], NP   50 mg at 10/29/19 2109    Lab Results: No results found for this or any previous visit (from the past 48 hour(s)).  Blood Alcohol level:  Lab Results  Component Value Date   ETH <10 10/16/2019   ETH <10 56/21/3086    Metabolic Disorder Labs: Lab Results  Component Value Date   HGBA1C 5.4 10/16/2019   MPG 108.28 10/16/2019   MPG 102.54 05/17/2019   Lab Results  Component Value Date   PROLACTIN 152.0 (H) 10/16/2019   PROLACTIN 31.9 (H) 02/17/2019   Lab Results  Component Value Date   CHOL 138 10/16/2019   TRIG 60 10/16/2019   HDL 36 (L) 10/16/2019   CHOLHDL 3.8 10/16/2019   VLDL 12 10/16/2019   LDLCALC 90 10/16/2019   LDLCALC 96 05/17/2019    Physical Findings: AIMS: Facial and Oral Movements Muscles of Facial Expression: None, normal Lips and Perioral Area: None, normal Jaw: None, normal Tongue: None, normal,Extremity Movements Upper (arms, wrists, hands, fingers): None, normal Lower (legs, knees, ankles, toes): None, normal, Trunk Movements Neck, shoulders, hips: None, normal, Overall Severity Severity of abnormal movements (highest score from questions above): None, normal Incapacitation due to abnormal movements: None, normal Patient's awareness of abnormal movements (rate only patient's report): No Awareness, Dental Status Current problems with teeth and/or dentures?: No Does patient usually wear dentures?: No  CIWA:  CIWA-Ar Total: 1 COWS:  COWS Total Score: 2  Musculoskeletal: Strength & Muscle Tone: within normal  limits Gait & Station: normal Patient leans: N/A  Psychiatric Specialty Exam: Physical Exam  Nursing note and vitals reviewed. Constitutional: She is oriented to person, place, and time. She appears well-developed and well-nourished.  Respiratory: Effort normal.  Musculoskeletal:        General: Normal range of motion.  Neurological: She is alert and oriented to person, place, and time.    Review of Systems  Constitutional: Negative.   Respiratory: Negative for cough and shortness of breath.   Psychiatric/Behavioral: Negative for agitation, behavioral problems, confusion, dysphoric mood, hallucinations, self-injury, sleep disturbance and suicidal ideas. The patient is not nervous/anxious and is not hyperactive.   denies chest pain, no shortness of breath at room air, no cough  Blood pressure 96/62, pulse (!) 118, temperature 97.6 F (36.4 C), temperature source Oral, resp. rate 16, height '5\' 3"'$  (1.6 m), weight 67.8 kg, SpO2 98 %.Body mass index is 26.48 kg/m.  General Appearance: Fairly Groomed  Eye Contact:  Good  Speech:  Slow  Volume:  Normal  Mood:  states mood is " OK"  Affect:  vaguely anxious, blunted   Thought Process:  Linear and Descriptions of Associations: Circumstantial  Orientation:  Other:  fully alert and attentive   Thought Content:  (+) hallucinations, delusions, regarding thought insertion ideations  Suicidal Thoughts:  No today denies suicidal or self injurious ideations, denies homicidal or violent ideations  Homicidal Thoughts:  No  Memory:  recent and remote grossly intact   Judgement:  Fair  Insight:  Lacking  Psychomotor Activity:  Normal- episode of acute agitation earlier today, now improved   Concentration:  Concentration: Fair and Attention Span: Fair  Recall:  [employer] of Knowledge:  Fair  Language:  Fair  Akathisia:  No  Handed:  Right  AIMS (if indicated):     Assets:  Agricultural consultant Leisure Time   ADL's:  Intact  Cognition:  WNL  Sleep:  Number of Hours: 6.5  Assessment : 19 year old female , admitted on 10/16/19 for depression, SI, psychotic symptoms. Reported hallucinations and thought insertion delusions , describing a man she had never met who was  harassing her mentally and placing ego-dystonic thoughts into her head.  Patient had an episode of acute agitation today during which she started yelling and lunged at a female peer. This episode was sudden and she had been calm and cooperative prior to event, although staff report she did appear internally preoccupied earlier . She accused patient of insulting her ( which was not witnessed by staff) and it appears she continues to experience psychotic symptoms/paranoia. She has reported persistent thought insertion ideations.  Received Haldol/Ativan IM and when seen later in the morning was significantly calmer.  Denies SI.  * Based on above, as per treatment team decision, patient was transferred to [address] .  Marland KitchenPatient is on a therapeutic dose of antipsychotic- Invega. Will review with staff /Dr. Jake Samples in AM to review possible treatment options, and would consider Clozaril trial if appropriate.  Treatment Plan Summary: Daily contact with patient to assess and evaluate symptoms and progress in treatment and Medication management  Encourage group and milieu participation Continue inpatient hospitalization. Continue Invega 9 mg PO daily for psychosis Continue Cogentin 1 mg PO BID PRN tremors/EPS Continue Prozac 40 mg PO daily for depresison Continue gabapentin 100 mg PO TID for anxiety Continue Haldol/Ativan PO/IM PRN agitation Continue Trazodone 50 mg PO QHS PRN insomnia Recheck EKG to monitor QTc interval ( 4/4 QTc 452)   Jenne Campus, MD 10/31/2019, 11:59 AM   Patient ID: Julia [name], female   DOB: April 23, 2001, 19 y.o.   MRN: [id number]

## 2019-11-01 DIAGNOSIS — R4589 Other symptoms and signs involving emotional state: Secondary | ICD-10-CM | POA: Diagnosis not present

## 2019-11-01 NOTE — Tx Team (Signed)
Interdisciplinary Treatment and Diagnostic Plan Update  11/01/2019 Time of Session: 8:45am  LACRESIA FERNANDO MRN: [id number]  Principal Diagnosis: Schizophrenia, paranoid [facility])  Secondary Diagnoses: Principal Problem:   Schizophrenia, paranoid ([place]) Active Problems:   Depressed mood   Psychosis ([place])   MDD (major depressive disorder)   Current Medications:  Current Facility-Administered Medications  Medication Dose Route Frequency Provider Last Rate Last Admin  . acetaminophen (TYLENOL) tablet 650 mg  650 mg Oral Q6H PRN Emmaline Kluver, FNP      . alum & mag hydroxide-simeth (MAALOX/MYLANTA) 200-200-20 MG/5ML suspension 30 mL  30 mL Oral Q4H PRN Emmaline Kluver, FNP      . benztropine (COGENTIN) tablet 1 mg  1 mg Oral BID PRN Cobos, Myer Peer, MD   1 mg at 10/27/19 1644  . FLUoxetine (PROZAC) capsule 40 mg  40 mg Oral Daily Cobos, Myer Peer, MD   40 mg at 11/01/19 0812  . gabapentin (NEURONTIN) capsule 100 mg  100 mg Oral TID Cobos, Myer Peer, MD   100 mg at 11/01/19 [id number]  . haloperidol (HALDOL) tablet 5 mg  5 mg Oral Q6H PRN Cobos, Myer Peer, MD   5 mg at 10/25/19 1432   Or  . haloperidol lactate (HALDOL) injection 5 mg  5 mg Intramuscular Q6H PRN Cobos, Myer Peer, MD      . LORazepam (ATIVAN) tablet 2 mg  2 mg Oral Q6H PRN Cobos, Myer Peer, MD   2 mg at 11/01/19 [id number]   Or  . LORazepam (ATIVAN) injection 2 mg  2 mg Intramuscular Q6H PRN Cobos, Myer Peer, MD   2 mg at 10/31/19 1018  . magnesium hydroxide (MILK OF MAGNESIA) suspension 30 mL  30 mL Oral Daily PRN Emmaline Kluver, FNP      . nicotine polacrilex (NICORETTE) gum 2 mg  2 mg Oral PRN Cobos, Myer Peer, MD   2 mg at 10/31/19 1627  . paliperidone (INVEGA) 24 hr tablet 9 mg  9 mg Oral Daily [name], MD   9 mg at 11/01/19 [id number]  . traZODone (DESYREL) tablet 50 mg  50 mg Oral QHS PRN [name], NP   50 mg at 10/29/19 2109   PTA Medications: Medications Prior to Admission  Medication Sig Dispense Refill Last Dose  .  ibuprofen (ADVIL) 400 MG tablet Take 400 mg by mouth every 6 (six) hours as needed for headache or mild pain.     . benztropine (COGENTIN) 1 MG tablet Take 1 tablet (1 mg total) by mouth 2 (two) times daily. (Patient not taking: Reported on 10/16/2019) 60 tablet 1 Not Taking at Unknown time  . FLUoxetine (PROZAC) 20 MG capsule Take 1 capsule (20 mg total) by mouth daily. (Patient not taking: Reported on 10/16/2019) 30 capsule 1 Not Taking at Unknown time  . gabapentin (NEURONTIN) 400 MG capsule Take 1 capsule (400 mg total) by mouth 3 (three) times daily. (Patient not taking: Reported on 10/16/2019) 90 capsule 1 Not Taking at Unknown time  . haloperidol decanoate (HALDOL DECANOATE) 100 MG/ML injection Inject 0.5 mLs (50 mg total) into the muscle every 30 (thirty) days. NEXT DOSE DUE AROUND September 15, 2019 (Patient not taking: Reported on 10/16/2019) 1 mL 1 Not Taking at Unknown time  . paliperidone (INVEGA SUSTENNA) 156 MG/ML SUSY injection Inject 1 mL (156 mg total) into the muscle every 30 (thirty) days. NEXT DOSE DUE AROUND September 15, 2019 (Patient not taking: Reported on 10/16/2019) 1.2 mL 1  Not Taking at Unknown time  . paliperidone (INVEGA) 3 MG 24 hr tablet Take 1 tablet (3 mg total) by mouth daily. (Patient not taking: Reported on 10/16/2019) 30 tablet 1 Not Taking at Unknown time  . traZODone (DESYREL) 50 MG tablet Take 1 tablet (50 mg total) by mouth at bedtime. (Patient not taking: Reported on 10/16/2019) 30 tablet 1 Not Taking at Unknown time    Patient Stressors:    Patient Strengths:    Treatment Modalities: Medication Management, Group therapy, Case management,  1 to 1 session with clinician, Psychoeducation, Recreational therapy.   Physician Treatment Plan for Primary Diagnosis: Schizophrenia, paranoid ([place]) Long Term Goal(s): Improvement in symptoms so as ready for discharge Improvement in symptoms so as ready for discharge   Short Term Goals: Ability to identify changes in lifestyle to reduce  recurrence of condition will improve Ability to verbalize feelings will improve Ability to disclose and discuss suicidal ideas Ability to demonstrate self-control will improve Ability to identify and develop effective coping behaviors will improve Ability to maintain clinical measurements within normal limits will improve Compliance with prescribed medications will improve Ability to identify changes in lifestyle to reduce recurrence of condition will improve Ability to verbalize feelings will improve Ability to disclose and discuss suicidal ideas Ability to demonstrate self-control will improve Ability to identify and develop effective coping behaviors will improve Ability to maintain clinical measurements within normal limits will improve Compliance with prescribed medications will improve  Medication Management: Evaluate patient's response, side effects, and tolerance of medication regimen.  Therapeutic Interventions: 1 to 1 sessions, Unit Group sessions and Medication administration.  Evaluation of Outcomes: Progressing  Physician Treatment Plan for Secondary Diagnosis: Principal Problem:   Schizophrenia, paranoid ([place]) Active Problems:   Depressed mood   Psychosis ([place])   MDD (major depressive disorder)  Long Term Goal(s): Improvement in symptoms so as ready for discharge Improvement in symptoms so as ready for discharge   Short Term Goals: Ability to identify changes in lifestyle to reduce recurrence of condition will improve Ability to verbalize feelings will improve Ability to disclose and discuss suicidal ideas Ability to demonstrate self-control will improve Ability to identify and develop effective coping behaviors will improve Ability to maintain clinical measurements within normal limits will improve Compliance with prescribed medications will improve Ability to identify changes in lifestyle to reduce recurrence of condition will improve Ability to verbalize  feelings will improve Ability to disclose and discuss suicidal ideas Ability to demonstrate self-control will improve Ability to identify and develop effective coping behaviors will improve Ability to maintain clinical measurements within normal limits will improve Compliance with prescribed medications will improve     Medication Management: Evaluate patient's response, side effects, and tolerance of medication regimen.  Therapeutic Interventions: 1 to 1 sessions, Unit Group sessions and Medication administration.  Evaluation of Outcomes: Progressing   RN Treatment Plan for Primary Diagnosis: Schizophrenia, paranoid ([place]) Long Term Goal(s): Knowledge of disease and therapeutic regimen to maintain health will improve  Short Term Goals: Ability to participate in decision making will improve, Ability to verbalize feelings will improve, Ability to disclose and discuss suicidal ideas, Ability to identify and develop effective coping behaviors will improve and Compliance with prescribed medications will improve  Medication Management: RN will administer medications as ordered by provider, will assess and evaluate patient's response and provide education to patient for prescribed medication. RN will report any adverse and/or side effects to prescribing provider.  Therapeutic Interventions: 1 on 1 counseling sessions, Psychoeducation,  Medication administration, Evaluate responses to treatment, Monitor vital signs and CBGs as ordered, Perform/monitor CIWA, COWS, AIMS and Fall Risk screenings as ordered, Perform wound care treatments as ordered.  Evaluation of Outcomes: Progressing   LCSW Treatment Plan for Primary Diagnosis: Schizophrenia, paranoid ([place]) Long Term Goal(s): Safe transition to appropriate next level of care at discharge, Engage patient in therapeutic group addressing interpersonal concerns.  Short Term Goals: Engage patient in aftercare planning with referrals and resources and  Increase skills for wellness and recovery  Therapeutic Interventions: Assess for all discharge needs, 1 to 1 time with Social worker, Explore available resources and support systems, Assess for adequacy in community support network, Educate family and significant other(s) on suicide prevention, Complete Psychosocial Assessment, Interpersonal group therapy.  Evaluation of Outcomes: Progressing   Progress in Treatment: Attending groups: No. Participating in groups: No. Taking medication as prescribed: Yes. Toleration medication: Yes. Family/Significant other contact made: Yes, individual(s) contacted:  with pt's father  Patient understands diagnosis: Yes. Discussing patient identified problems/goals with staff: Yes. Medical problems stabilized or resolved: Yes. Denies suicidal/homicidal ideation: Yes. Issues/concerns per patient self-inventory: No. Other:   New problem(s) identified: Yes, Describe:  group home placement   New Short Term/Long Term Goal(s): Medication stabilization, elimination of SI thoughts, and development of a comprehensive mental wellness plan.   Patient Goals:    Discharge Plan or Barriers: CSW will continue to follow up for appropriate referrals and possible discharge planning  Reason for Continuation of Hospitalization: Anxiety Delusions  Medication stabilization  Estimated Length of Stay: TBD  Attendees: Patient:  10/22/2019   Physician: Dr. Jake Samples, MD; Dr. Neita Garnet, MD 10/22/2019   Nursing: Gilberto Better., RN 10/22/2019   RN Care Manager: 10/22/2019   Social Worker: Lurline Idol, LCSW; Radonna Ricker, LCSW 10/22/2019   Recreational Therapist:  10/22/2019   Other: Ovidio Kin, MSW intern  10/22/2019   Other:  10/22/2019   Other: 10/22/2019      Scribe for Treatment Team: Marylee Floras, [place] 11/01/2019 10:58 AM

## 2019-11-01 NOTE — [facility] Group Notes (Signed)
Pt did not attend wrap up group this evening. Pt was in bed sleeping.  

## 2019-11-01 NOTE — [facility] Counselor (Signed)
CSW and TOC Supervisor, Marya Amsler, continuing to follow for a safe disposition.  CSW was able to speak with "Jackelyn Poling" with [facility]. Debbie reports there are no shelter beds available for females at this time, including [place].  [name], with [name] has not been able to secure a bed for patient through [facility] in [place].  [name], MSW, LCSW-A Clinical Social Worker [facility]

## 2019-11-01 NOTE — Progress Notes (Signed)
[facility] MD Progress Note  11/01/2019 7:58 AM Julia [name]  MRN:  [id number] Subjective:   Patient reports an uneventful weekend however states voices "came back" describes them as "the same" but does not elaborate on content she certainly call does not appear to be responding.  No visual hallucinations or suicidal thoughts endorsed Principal Problem: Schizophrenia, paranoid ([place]) Diagnosis: Principal Problem:   Schizophrenia, paranoid ([place]) Active Problems:   Depressed mood   Psychosis ([place])   MDD (major depressive disorder)  Total Time spent with patient: 20 minutes  Past Psychiatric History: Pensive  Past Medical History:  Past Medical History:  Diagnosis Date  . Burning with urination 05/03/2015  . Contraceptive management 05/03/2015  . Depression   . Dysmenorrhea 12/30/2013  . Heroin addiction ([place])   . Menorrhagia 12/30/2013  . Menstrual extraction 12/30/2013  . Migraines   . Social anxiety disorder 09/25/2016  . Suicidal ideations   . Vaginal odor 05/03/2015    Past Surgical History:  Procedure Laterality Date  . NO PAST SURGERIES     Family History:  Family History  Problem Relation Age of Onset  . Depression Mother   . Hypertension Father   . Hyperlipidemia Father   . Cancer Paternal Grandmother        breast, uterine  . Cirrhosis Paternal Grandfather        due to alcohol   Family Psychiatric  History: No new data shared Social History:  Social History   Substance and Sexual Activity  Alcohol Use Yes   Comment: BAC was clear     Social History   Substance and Sexual Activity  Drug Use Yes  . Types: Marijuana, Oxycodone   Comment: reports oxycodone and marijuana    Social History   Socioeconomic History  . Marital status: Single    Spouse name: Not on file  . Number of children: Not on file  . Years of education: Not on file  . Highest education level: Not on file  Occupational History  . Occupation: Unemployed  Tobacco Use  . Smoking status:  Current Every Day Smoker    Packs/day: 0.50    Types: Cigarettes  . Smokeless tobacco: Never Used  . Tobacco comment: Not ready to quit  Substance and Sexual Activity  . Alcohol use: Yes    Comment: BAC was clear  . Drug use: Yes    Types: Marijuana, Oxycodone    Comment: reports oxycodone and marijuana  . Sexual activity: Yes    Birth control/protection: Implant  Other Topics Concern  . Not on file  Social History Narrative   06/23/2019:  Pt stated that she is homeless, that she is a high school graduate, and that she is unemployed and not followed by any outpatient provider.      Lives with Dad. Mom has LGD, lives in [place]. 11th grader. Dog.   Social Determinants of Health   Financial Resource Strain:   . Difficulty of Paying Living Expenses:   Food Insecurity:   . Worried About Charity fundraiser in the Last Year:   . Arboriculturist in the Last Year:   Transportation Needs:   . Film/video editor (Medical):   [name] Lack of Transportation (Non-Medical):   Physical Activity:   . Days of Exercise per Week:   . Minutes of Exercise per Session:   Stress:   . Feeling of Stress :   Social Connections:   . Frequency of Communication with Friends and Family:   .  Frequency of Social Gatherings with Friends and Family:   . Attends Religious Services:   . Active Member of Clubs or Organizations:   . Attends Archivist Meetings:   [name] Marital Status:    Additional Social History:    Pain Medications: Please see MAR Prescriptions: Please see MAR, prozac and cogentin per pt Over the Counter: Please see MAR History of alcohol / drug use?: Yes Longest period of sobriety (when/how long): Unknown Negative Consequences of Use: Legal Name of Substance 1: alcohol 1 - Frequency: UTA 1 - Duration: UTA- pt denies 1 - Last Use / Amount: UTA                  Sleep: Good  Appetite:  Good  Current Medications: Current Facility-Administered Medications  Medication  Dose Route Frequency Provider Last Rate Last Admin  . acetaminophen (TYLENOL) tablet 650 mg  650 mg Oral Q6H PRN Emmaline Kluver, FNP      . alum & mag hydroxide-simeth (MAALOX/MYLANTA) 200-200-20 MG/5ML suspension 30 mL  30 mL Oral Q4H PRN Emmaline Kluver, FNP      . benztropine (COGENTIN) tablet 1 mg  1 mg Oral BID PRN Cobos, Myer Peer, MD   1 mg at 10/27/19 1644  . FLUoxetine (PROZAC) capsule 40 mg  40 mg Oral Daily Cobos, Myer Peer, MD   40 mg at 10/31/19 0847  . gabapentin (NEURONTIN) capsule 100 mg  100 mg Oral TID Cobos, Myer Peer, MD   100 mg at 10/31/19 1627  . haloperidol (HALDOL) tablet 5 mg  5 mg Oral Q6H PRN Cobos, Myer Peer, MD   5 mg at 10/25/19 1432   Or  . haloperidol lactate (HALDOL) injection 5 mg  5 mg Intramuscular Q6H PRN Cobos, Myer Peer, MD      . LORazepam (ATIVAN) tablet 2 mg  2 mg Oral Q6H PRN Cobos, Myer Peer, MD   2 mg at 10/25/19 1432   Or  . LORazepam (ATIVAN) injection 2 mg  2 mg Intramuscular Q6H PRN Cobos, Myer Peer, MD   2 mg at 10/31/19 1018  . magnesium hydroxide (MILK OF MAGNESIA) suspension 30 mL  30 mL Oral Daily PRN Emmaline Kluver, FNP      . nicotine polacrilex (NICORETTE) gum 2 mg  2 mg Oral PRN Cobos, Myer Peer, MD   2 mg at 10/31/19 1627  . paliperidone (INVEGA) 24 hr tablet 9 mg  9 mg Oral Daily [name], MD   9 mg at 10/31/19 0847  . traZODone (DESYREL) tablet 50 mg  50 mg Oral QHS PRN [name], NP   50 mg at 10/29/19 2109    Lab Results: No results found for this or any previous visit (from the past 48 hour(s)).  Blood Alcohol level:  Lab Results  Component Value Date   ETH <10 10/16/2019   ETH <10 123456    Metabolic Disorder Labs: Lab Results  Component Value Date   HGBA1C 5.4 10/16/2019   MPG 108.28 10/16/2019   MPG 102.54 05/17/2019   Lab Results  Component Value Date   PROLACTIN 152.0 (H) 10/16/2019   PROLACTIN 31.9 (H) 02/17/2019   Lab Results  Component Value Date   CHOL 138 10/16/2019   TRIG 60  10/16/2019   HDL 36 (L) 10/16/2019   CHOLHDL 3.8 10/16/2019   VLDL 12 10/16/2019   LDLCALC 90 10/16/2019   LDLCALC 96 05/17/2019    Physical Findings: AIMS: Facial and Oral  Movements Muscles of Facial Expression: None, normal Lips and Perioral Area: None, normal Jaw: None, normal Tongue: None, normal,Extremity Movements Upper (arms, wrists, hands, fingers): None, normal Lower (legs, knees, ankles, toes): None, normal, Trunk Movements Neck, shoulders, hips: None, normal, Overall Severity Severity of abnormal movements (highest score from questions above): None, normal Incapacitation due to abnormal movements: None, normal Patient's awareness of abnormal movements (rate only patient's report): No Awareness, Dental Status Current problems with teeth and/or dentures?: No Does patient usually wear dentures?: No  CIWA:  CIWA-Ar Total: 1 COWS:  COWS Total Score: 2  Musculoskeletal: Strength & Muscle Tone: within normal limits Gait & Station: normal Patient leans: N/A  Psychiatric Specialty Exam: Physical Exam  Review of Systems  Blood pressure 96/62, pulse (!) 118, temperature 97.6 F (36.4 C), temperature source Oral, resp. rate 16, height 5\' 3"  (1.6 m), weight 67.8 kg, SpO2 98 %.Body mass index is 26.48 kg/m.  General Appearance: Casual  Eye Contact:  Poor  Speech:  Slow  Volume:  Decreased  Mood:  Euthymic  Affect:  Flat  Thought Process:  Goal Directed  Orientation:  Full (Time, Place, and Person)  Thought Content:  Hallucinations: Auditory however may simply be endorsing symptoms that may not be present  Suicidal Thoughts:  No  Homicidal Thoughts:  No  Memory:  Recent;   Fair did not repeat 3/3  Judgement:  Fair  Insight:  Fair  Psychomotor Activity:  Normal  Concentration:  Concentration: Fair and Attention Span: Fair  Recall:  [employer] of Knowledge:  Fair  Language:  Fair  Akathisia:  Negative  Handed:  Right  AIMS (if indicated):     Assets:  Leisure  Time Physical Health Resilience  ADL's:  Intact  Cognition:  WNL  Sleep:  Number of Hours: 6.25     Treatment Plan Summary: Daily contact with patient to assess and evaluate symptoms and progress in treatment and Medication management  Continue paliperidone with long-acting injectable continue to monitor for safety no change in precautions probable discharge when housing available  [facility], MD 11/01/2019, 7:58 AM

## 2019-11-01 NOTE — BHH Group Notes (Signed)
LCSW Group Therapy Note 11/01/2019 2:03 PM  Type of Therapy and Topic: Group Therapy: Overcoming Obstacles  Participation Level: Active  Description of Group:  In this group patients will be encouraged to explore what they see as obstacles to their own wellness and recovery. They will be guided to discuss their thoughts, feelings, and behaviors related to these obstacles. The group will process together ways to cope with barriers, with attention given to specific choices patients can make. Each patient will be challenged to identify changes they are motivated to make in order to overcome their obstacles. This group will be process-oriented, with patients participating in exploration of their own experiences as well as giving and receiving support and challenge from other group members.  Therapeutic Goals: 1. Patient will identify personal and current obstacles as they relate to admission. 2. Patient will identify barriers that currently interfere with their wellness or overcoming obstacles.  3. Patient will identify feelings, thought process and behaviors related to these barriers. 4. Patient will identify two changes they are willing to make to overcome these obstacles:   Summary of Patient Progress  Julia [name] was engaged and participated throughout the group session. Julia [name] reports her main obstacle is not having anywhere to live when she is discharged. She reports she is frustrated that she has no where to live. Julia [name] reports she plans to wait at the hospital until she has somewhere to go.     Therapeutic Modalities:  Cognitive Behavioral Therapy Solution Focused Therapy Motivational Interviewing Relapse Prevention Therapy   Theresa Duty Clinical Social Worker

## 2019-11-01 NOTE — Progress Notes (Signed)
Recreation Therapy Notes  Date:  4.19.21 Time: 0930 Location: [address] Group Room  Group Topic: Stress Management  Goal Area(s) Addresses:  Patient will identify positive stress management techniques. Patient will identify benefits of using stress management post d/c.  Intervention:  Stress Management  Activity :  Guided Imagery.  LRT was to read a script that focused on envisioning your peaceful place.  Patients were to listen and follow along as script was read to engage in activity.  Education:  Stress Management, Discharge Planning.   Education Outcome: Acknowledges Education  Clinical Observations/Feedback: Pt did not attend group activity.    [name], LRT/CTRS     [name] A 11/01/2019 11:16 AM

## 2019-11-01 NOTE — Progress Notes (Signed)
DAR NOTE: Patient presents with anxious affect and depressed mood.  Denies suicidal thoughts and visual hallucinations.  Rates depression at 0, hopelessness at 0, and anxiety at 0.  Maintained on routine safety checks.  Medications given as prescribed.  Support and encouragement offered as needed.  Attended group and participated.  States goal for today is "find 10 coping skills for anxiety."  Patient visible in milieu with minimal interaction.  Patient requested and received Ativan 2 mg for complain if anxiety and agitation with good effect.  Patient is safe on and off the unit.

## 2019-11-02 DIAGNOSIS — R4589 Other symptoms and signs involving emotional state: Secondary | ICD-10-CM | POA: Diagnosis not present

## 2019-11-02 LAB — URINALYSIS, COMPLETE (UACMP) WITH MICROSCOPIC
Bacteria, UA: NONE SEEN
Bilirubin Urine: NEGATIVE
Glucose, UA: NEGATIVE mg/dL
Hgb urine dipstick: NEGATIVE
Ketones, ur: NEGATIVE mg/dL
Leukocytes,Ua: NEGATIVE
Nitrite: NEGATIVE
Protein, ur: NEGATIVE mg/dL
Specific Gravity, Urine: 1.015 (ref 1.005–1.030)
pH: 7 (ref 5.0–8.0)

## 2019-11-02 NOTE — Progress Notes (Signed)
[facility] MD Progress Note  11/02/2019 9:39 AM Julia [name]  MRN:  [id number] Subjective: [name] with a past psychiatric history significant for schizophrenia, unspecified depression, methamphetamine use disorder in partial remission who was admitted on 10/16/2019 after she had left a group home, and developed suicidal ideation, auditory hallucinations and paranoia.  Objective: Patient is seen and examined.  [name] familiar to me from previous examinations and admissions.  She is seen in follow-up.  She denied complaints today.  She denied any auditory or visual hallucinations.  She was hesitant about agreeing or not agreeing with any paranoid thinking.  Was able to smile a bit today and able to engage.  Her blood pressure stable, but she was tachycardic this morning with a rate of 138.  She is afebrile.  She did sleep 6.25 hours last night.  Her last EKG from 4/18 showed a sinus tachycardia with essentially normal QTc interval.  Review of her laboratories showed no new results.  Principal Problem: Schizophrenia, paranoid ([place]) Diagnosis: Principal Problem:   Schizophrenia, paranoid ([place]) Active Problems:   Depressed mood   Psychosis ([place])   MDD (major depressive disorder)  Total Time spent with patient: 15 minutes  Past Psychiatric History: See admission H&P  Past Medical History:  Past Medical History:  Diagnosis Date  . Burning with urination 05/03/2015  . Contraceptive management 05/03/2015  . Depression   . Dysmenorrhea 12/30/2013  . Heroin addiction ([place])   . Menorrhagia 12/30/2013  . Menstrual extraction 12/30/2013  . Migraines   . Social anxiety disorder 09/25/2016  . Suicidal ideations   . Vaginal odor 05/03/2015    Past Surgical History:  Procedure Laterality Date  . NO PAST SURGERIES     Family History:  Family History  Problem Relation Age of Onset  . Depression Mother   . Hypertension Father   . Hyperlipidemia Father   .  Cancer Paternal Grandmother        breast, uterine  . Cirrhosis Paternal Grandfather        due to alcohol   Family Psychiatric  History: See admission H&P Social History:  Social History   Substance and Sexual Activity  Alcohol Use Yes   Comment: BAC was clear     Social History   Substance and Sexual Activity  Drug Use Yes  . Types: Marijuana, Oxycodone   Comment: reports oxycodone and marijuana    Social History   Socioeconomic History  . Marital status: Single    Spouse name: Not on file  . Number of children: Not on file  . Years of education: Not on file  . Highest education level: Not on file  Occupational History  . Occupation: Unemployed  Tobacco Use  . Smoking status: Current Every Day Smoker    Packs/day: 0.50    Types: Cigarettes  . Smokeless tobacco: Never Used  . Tobacco comment: Not ready to quit  Substance and Sexual Activity  . Alcohol use: Yes    Comment: BAC was clear  . Drug use: Yes    Types: Marijuana, Oxycodone    Comment: reports oxycodone and marijuana  . Sexual activity: Yes    Birth control/protection: Implant  Other Topics Concern  . Not on file  Social History Narrative   06/23/2019:  Pt stated that she is homeless, that she is a high school graduate, and that she is unemployed and not followed by any outpatient provider.      Lives  with Dad. Mom has LGD, lives in [place]. 11th grader. Dog.   Social Determinants of Health   Financial Resource Strain:   . Difficulty of Paying Living Expenses:   Food Insecurity:   . Worried About Charity fundraiser in the Last Year:   . Arboriculturist in the Last Year:   Transportation Needs:   . Film/video editor (Medical):   [name] Lack of Transportation (Non-Medical):   Physical Activity:   . Days of Exercise per Week:   . Minutes of Exercise per Session:   Stress:   . Feeling of Stress :   Social Connections:   . Frequency of Communication with Friends and Family:   . Frequency of Social  Gatherings with Friends and Family:   . Attends Religious Services:   . Active Member of Clubs or Organizations:   . Attends Archivist Meetings:   [name] Marital Status:    Additional Social History:    Pain Medications: Please see MAR Prescriptions: Please see MAR, prozac and cogentin per pt Over the Counter: Please see MAR History of alcohol / drug use?: Yes Longest period of sobriety (when/how long): Unknown Negative Consequences of Use: Legal Name of Substance 1: alcohol 1 - Frequency: UTA 1 - Duration: UTA- pt denies 1 - Last Use / Amount: UTA                  Sleep: Good  Appetite:  Fair  Current Medications: Current Facility-Administered Medications  Medication Dose Route Frequency Provider Last Rate Last Admin  . acetaminophen (TYLENOL) tablet 650 mg  650 mg Oral Q6H PRN Emmaline Kluver, FNP      . alum & mag hydroxide-simeth (MAALOX/MYLANTA) 200-200-20 MG/5ML suspension 30 mL  30 mL Oral Q4H PRN Emmaline Kluver, FNP      . benztropine (COGENTIN) tablet 1 mg  1 mg Oral BID PRN Cobos, Myer Peer, MD   1 mg at 10/27/19 1644  . FLUoxetine (PROZAC) capsule 40 mg  40 mg Oral Daily Cobos, Myer Peer, MD   40 mg at 11/02/19 0858  . gabapentin (NEURONTIN) capsule 100 mg  100 mg Oral TID Cobos, Myer Peer, MD   100 mg at 11/02/19 0858  . haloperidol (HALDOL) tablet 5 mg  5 mg Oral Q6H PRN Cobos, Myer Peer, MD   5 mg at 10/25/19 1432   Or  . haloperidol lactate (HALDOL) injection 5 mg  5 mg Intramuscular Q6H PRN Cobos, Myer Peer, MD      . LORazepam (ATIVAN) tablet 2 mg  2 mg Oral Q6H PRN Cobos, Myer Peer, MD   2 mg at 11/01/19 [id number]   Or  . LORazepam (ATIVAN) injection 2 mg  2 mg Intramuscular Q6H PRN Cobos, Myer Peer, MD   2 mg at 10/31/19 1018  . magnesium hydroxide (MILK OF MAGNESIA) suspension 30 mL  30 mL Oral Daily PRN Emmaline Kluver, FNP      . nicotine polacrilex (NICORETTE) gum 2 mg  2 mg Oral PRN Cobos, Myer Peer, MD   2 mg at 11/02/19 0859  . paliperidone  (INVEGA) 24 hr tablet 9 mg  9 mg Oral Daily [name], MD   9 mg at 11/02/19 0858  . traZODone (DESYREL) tablet 50 mg  50 mg Oral QHS PRN [name], NP   50 mg at 10/29/19 2109    Lab Results: No results found for this or any previous visit (from the past  48 hour(s)).  Blood Alcohol level:  Lab Results  Component Value Date   ETH <10 10/16/2019   ETH <10 123456    Metabolic Disorder Labs: Lab Results  Component Value Date   HGBA1C 5.4 10/16/2019   MPG 108.28 10/16/2019   MPG 102.54 05/17/2019   Lab Results  Component Value Date   PROLACTIN 152.0 (H) 10/16/2019   PROLACTIN 31.9 (H) 02/17/2019   Lab Results  Component Value Date   CHOL 138 10/16/2019   TRIG 60 10/16/2019   HDL 36 (L) 10/16/2019   CHOLHDL 3.8 10/16/2019   VLDL 12 10/16/2019   LDLCALC 90 10/16/2019   LDLCALC 96 05/17/2019    Physical Findings: AIMS: Facial and Oral Movements Muscles of Facial Expression: None, normal Lips and Perioral Area: None, normal Jaw: None, normal Tongue: None, normal,Extremity Movements Upper (arms, wrists, hands, fingers): None, normal Lower (legs, knees, ankles, toes): None, normal, Trunk Movements Neck, shoulders, hips: None, normal, Overall Severity Severity of abnormal movements (highest score from questions above): None, normal Incapacitation due to abnormal movements: None, normal Patient's awareness of abnormal movements (rate only patient's report): No Awareness, Dental Status Current problems with teeth and/or dentures?: No Does patient usually wear dentures?: No  CIWA:  CIWA-Ar Total: 1 COWS:  COWS Total Score: 2  Musculoskeletal: Strength & Muscle Tone: within normal limits Gait & Station: normal Patient leans: N/A  Psychiatric Specialty Exam: Physical Exam  Nursing note and vitals reviewed. Constitutional: She is oriented to person, place, and time. She appears well-developed and well-nourished.  HENT:  Head: Normocephalic and atraumatic.   Respiratory: Effort normal.  Neurological: She is alert and oriented to person, place, and time.    Review of Systems  Blood pressure 104/61, pulse (!) 138, temperature 97.6 F (36.4 C), temperature source Oral, resp. rate 16, height 5\' 3"  (1.6 m), weight 67.8 kg, SpO2 98 %.Body mass index is 26.48 kg/m.  General Appearance: Casual  Eye Contact:  Fair  Speech:  Normal Rate  Volume:  Decreased  Mood:  Anxious and Dysphoric  Affect:  Congruent  Thought Process:  Goal Directed and Descriptions of Associations: Circumstantial  Orientation:  Full (Time, Place, and Person)  Thought Content:  Delusions and Hallucinations: Auditory  Suicidal Thoughts:  No  Homicidal Thoughts:  No  Memory:  Immediate;   Fair Recent;   Fair Remote;   Fair  Judgement:  Intact  Insight:  Fair  Psychomotor Activity:  Normal  Concentration:  Concentration: Fair and Attention Span: Fair  Recall:  [employer] of Knowledge:  Fair  Language:  Fair  Akathisia:  Negative  Handed:  Right  AIMS (if indicated):     Assets:  Desire for Improvement Resilience  ADL's:  Intact  Cognition:  WNL  Sleep:  Number of Hours: 6.25     Treatment Plan Summary: Daily contact with patient to assess and evaluate symptoms and progress in treatment, Medication management and Plan : Patient is seen and examined.  [name] with the above-stated past psychiatric history who is seen in follow-up.   Diagnosis: #1 psychosis: Schizoaffective disorder versus schizophrenia versus substance-induced psychotic disorder, #2 methamphetamine dependence in partial remission, #3 unspecified depression, #4 urinary tract infection (resolved)  Patient is seen in follow-up.  She continues to be near baseline.  She continues to have mild auditory hallucinations and continued paranoia.  These are mild compared to her previous evaluations.  It is unclear whether or not that urinalysis was ever cleared  after given Bactrim.  I will  repeat her urinalysis today.  She continues on Cogentin, fluoxetine, gabapentin, Invega and trazodone.  No change in her psychiatric medicines today.  Social work continues to work on placement as well as guardianship issues.  She was tachycardic this morning, and her EKG from 4/18 showed a sinus tach with a normal QTc interval.  1.  Continue Cogentin 1 mg p.o. twice daily as needed tremors and EPS. 2.  Continue fluoxetine 40 mg p.o. daily for depression and anxiety. 3.  Continue gabapentin 100 mg p.o. 3 times daily for anxiety and mood stability. 4.  Continue Haldol as needed and lorazepam as needed for agitation. 5.  Continue paliperidone 9 mg p.o. daily for psychosis. 6.  Continue trazodone 50 mg p.o. nightly as needed insomnia. 7.  Repeat urinalysis. 8.  Disposition planning-in progress.  Sharma Covert, MD 11/02/2019, 9:39 AM

## 2019-11-02 NOTE — Progress Notes (Signed)
Recreation Therapy Notes  Animal-Assisted Activity (AAA) Program Checklist/Progress Notes Patient Eligibility Criteria Checklist & Daily Group note for Rec Tx Intervention  Date: 4.20.21 Time: 72 Location: [address]   AAA/T Program Assumption of Risk Form signed by Teacher, music or Parent Legal Guardian  YES   Patient is free of allergies or sever asthma  YES   Patient reports no fear of animals  YES   Patient reports no history of cruelty to animals  YES   Patient understands his/her participation is voluntary  YES  Patient washes hands before animal contact  YES   Patient washes hands after animal contact  YES   Behavioral Response: Engaged  Education: Contractor, Appropriate Animal Interaction   Education Outcome: Acknowledges understanding/In group clarification offered/Needs additional education.   Clinical Observations/Feedback: Pt attended and participated in activity.    [name], LRT/CTRS   [name] A 11/02/2019 3:28 PM

## 2019-11-02 NOTE — Progress Notes (Signed)
Pt has been isolative, stayed in the bed the whole evening. No issues noted this far, will continue to monitor.

## 2019-11-02 NOTE — Progress Notes (Signed)
Psychoeducational Group Note  Date:  11/02/2019 Time:  2042  Group Topic/Focus:  Wrap-Up Group:   The focus of this group is to help patients review their daily goal of treatment and discuss progress on daily workbooks.  Participation Level: Did Not Attend  Participation Quality:  Not Applicable  Affect:  Not Applicable  Cognitive:  Not Applicable  Insight:  Not Applicable  Engagement in Group: Not Applicable  Additional Comments:  The patient did not attend group this evening.   Archie Balboa S 11/02/2019, 8:42 PM

## 2019-11-02 NOTE — BHH Counselor (Signed)
Patient spoke with CSW on the unit to request updates for discharge planning.  Patient is still agreeable to pursuing a group home placement. TOC Supervisor Marya Amsler has staffed case with [facility] Director [employer]. TOC Director is willing to consider a second LOG to facilitate a group home placement while patient's SSI application continues to process.  CSW will pursue a group home placement.  [name], MSW, LCSW-A Clinical Social Worker [facility]

## 2019-11-02 NOTE — Progress Notes (Signed)
   11/02/19 1000  Psych Admission Type (Psych Patients Only)  Admission Status Voluntary  Psychosocial Assessment  Patient Complaints Anxiety  Eye Contact Fair  Facial Expression Flat  Affect Appropriate to circumstance  Speech Logical/coherent  Interaction Minimal  Motor Activity Slow  Appearance/Hygiene Unremarkable  Behavior Characteristics Cooperative  Mood Anxious  Thought Process  Coherency WDL  Content WDL  Delusions Paranoid  Perception Hallucinations  Hallucination Auditory  Judgment Impaired  Confusion None  Danger to Self  Current suicidal ideation? Denies  Self-Injurious Behavior No self-injurious ideation or behavior indicators observed or expressed   Agreement Not to Harm Self Yes  Description of Agreement verbal  Danger to Others  Danger to Others None reported or observed

## 2019-11-02 NOTE — Progress Notes (Signed)
Spiritual care group on grief and loss facilitated by chaplain Jerene Pitch MDiv, BCC  Group Goal:  Support / Education around grief and loss Members engage in facilitated group support and psycho-social education.  Group Description:  Following introductions and group rules, group members engaged in facilitated group dialog and support around topic of loss, with particular support around experiences of loss in their lives. Group Identified types of loss (relationships / self / things) and identified patterns, circumstances, and changes that precipitate losses. Reflected on thoughts / feelings around loss, normalized grief responses, and recognized variety in grief experience.   Group noted Worden's four tasks of grief in discussion.  Group drew on Adlerian / Rogerian, narrative, MI, Patient Progress:  Julia [name] was present throughout group.  Did not engage in group conversation.  Appeared to be attentive to group members speaking throughout group.

## 2019-11-03 DIAGNOSIS — F200 Paranoid schizophrenia: Secondary | ICD-10-CM | POA: Diagnosis not present

## 2019-11-03 NOTE — Plan of Care (Signed)
Nurse discussed anxiety, depression and coping skills with patient.  

## 2019-11-03 NOTE — BHH Group Notes (Signed)
LCSW Group Therapy Note  Type of Therapy/Topic: Group Therapy: Six Dimensions of Wellness  Participation Level: Did Not Attend  Description of Group: This group will address the concept of wellness and the six concepts of wellness: occupational, physical, social, intellectual, spiritual, and emotional. Patients will be encouraged to process areas in their lives that are out of balance and identify reasons for remaining unbalanced. Patients will be encouraged to explore ways to practice healthy habits on a daily basis to attain better physical and mental health outcomes.  Therapeutic Goals: 1. Identify aspects of wellness that they are doing well. 2. Identify aspects of wellness that they would like to improve upon. 3. Identify one action they can take to improve an aspect of wellness in their lives.    Summary of Patient Progress: Invited, did not attend.  Therapeutic Modalities: Cognitive Behavioral Therapy Solution-Focused Therapy Relapse Prevention

## 2019-11-03 NOTE — BHH Counselor (Addendum)
CSW faxed over FL-2 and group home referral packet to patient's ACT Therapist, [name].   CSW left a voicemail for [facility] administrator yesterday to inquire about bed availability.  CSW spoke with Ms.Rucker with [facility] [phone]). Ms.Rucker reports she may have a vacancy late next week for a younger female and informed CSW that she can call on Monday (04/26) to confirm is a bed is available.   CSW spoke with [facility]), they have no bed availability for females.   CSW attempted to reach Lucy Antigua [phone]) owner of [facility] to inquire about bed availability. No option to leave a voicemail.  CSW attempted to reach administration at [facility] in [place] [phone]) to inquire about bed availability. No option to leave a voicemail.   CSW has received no further contact from [place]. Patient is no longer about to return to [facility].   CSW following.  [name], MSW, LCSW-A Clinical Social Worker [facility]

## 2019-11-03 NOTE — Progress Notes (Signed)
Psychoeducational Group Note  Date:  11/03/2019 Time: 2030  Group Topic/Focus:  wrap up group  Participation Level: Did Not Attend  Participation Quality:  Not Applicable  Affect:  Not Applicable  Cognitive:  Not Applicable  Insight:  Not Applicable  Engagement in Group: Not Applicable  Additional Comments:  Pt was in bed during group time.   Shellia Cleverly 11/03/2019, 8:56 PM

## 2019-11-03 NOTE — Progress Notes (Signed)
[facility] MD Progress Note  11/03/2019 9:45 AM Julia [name]  MRN:  [id number]   Subjective: "I am feeling o'kay. I feel about the same as yesterday."  Objective: Julia [name] is a 19 y.o. female who presented voluntarily to [facility] as a walk-in on 10/15/2019 due to suicidal ideations, auditory hallucinations, and paranoia. She has a past history of schizophrenia and methamphetamine use disorder. On evaluation today, the patient is sitting on the side of the bed in her room. She was observed earlier interacting with her peers in the day room. She is alert and oriented x 4, plesanat, and cooperative. Speech is clear and coherent, decreased in volume. She rates depression today as 5 out of 10 and anxiety as 7 out of 10 with 10 being the most severe. She denies auditory and visual hallucinations. Denies paranoia, however, she appears guarded. She does not appear to be responding to internal stimuli. She denies suicidal ideations. She reports that she slept throughout the night. She rates her appetite as "good." Per chart review she slept 6.75 hours. She is able to verbally contract for safety while in the hospital. She denies homicidal ideations.  From previous note: Patient is seen and examined.  [name] familiar to [name] from previous examinations and admissions.  She is seen in follow-up.  She denied complaints today.  She denied any auditory or visual hallucinations.  She was hesitant about agreeing or not agreeing with any paranoid thinking.  Was able to smile a bit today and able to engage.  Her blood pressure stable, but she was tachycardic this morning with a rate of 138.  She is afebrile.  She did sleep 6.25 hours last night.  Her last EKG from 4/18 showed a sinus tachycardia with essentially normal QTc interval.  Review of her laboratories showed no new results.  From admission H&P 10/16/2019: 19 year old female, known to [facility] from prior admissions, presented to [facility] voluntarily reporting  suicidal ideations, worsening depression over the last month or so, neuro-vegetative symptoms". She also presents with psychotic symptoms as noted below. She had been staying in a [facility] over the last month or so, but was kicked out 2- days ago due to an altercation with other clients and she is now homeless. She reports history of intermittent suicidal ideations , recently worsening . Describes these as passive. She reports she feels she is " being tortured " by a man . States she has never actually met this person- " I don't know who he is but he is a pedophile , he puts thoughts in my head , harasses [name] sexually"  She states "it is hard to explain , he thinks about those things and about [name] too at the same time". She states she sometimes hears his voice , saying " you are Daddy's little girl".   Principal Problem: Schizophrenia, paranoid ([place]) Diagnosis: Principal Problem:   Schizophrenia, paranoid ([place]) Active Problems:   Depressed mood   Psychosis ([place])   MDD (major depressive disorder)  Total Time spent with patient: 15 minutes  Past Psychiatric History: See admission H&P  Past Medical History:  Past Medical History:  Diagnosis Date  . Burning with urination 05/03/2015  . Contraceptive management 05/03/2015  . Depression   . Dysmenorrhea 12/30/2013  . Heroin addiction ([place])   . Menorrhagia 12/30/2013  . Menstrual extraction 12/30/2013  . Migraines   . Social anxiety disorder 09/25/2016  . Suicidal ideations   . Vaginal odor 05/03/2015  Past Surgical History:  Procedure Laterality Date  . NO PAST SURGERIES     Family History:  Family History  Problem Relation Age of Onset  . Depression Mother   . Hypertension Father   . Hyperlipidemia Father   . Cancer Paternal Grandmother        breast, uterine  . Cirrhosis Paternal Grandfather        due to alcohol   Family Psychiatric  History: See admission H&P Social History:  Social History   Substance and Sexual Activity   Alcohol Use Yes   Comment: BAC was clear     Social History   Substance and Sexual Activity  Drug Use Yes  . Types: Marijuana, Oxycodone   Comment: reports oxycodone and marijuana    Social History   Socioeconomic History  . Marital status: Single    Spouse name: Not on file  . Number of children: Not on file  . Years of education: Not on file  . Highest education level: Not on file  Occupational History  . Occupation: Unemployed  Tobacco Use  . Smoking status: Current Every Day Smoker    Packs/day: 0.50    Types: Cigarettes  . Smokeless tobacco: Never Used  . Tobacco comment: Not ready to quit  Substance and Sexual Activity  . Alcohol use: Yes    Comment: BAC was clear  . Drug use: Yes    Types: Marijuana, Oxycodone    Comment: reports oxycodone and marijuana  . Sexual activity: Yes    Birth control/protection: Implant  Other Topics Concern  . Not on file  Social History Narrative   06/23/2019:  Pt stated that she is homeless, that she is a high school graduate, and that she is unemployed and not followed by any outpatient provider.      Lives with Dad. Mom has LGD, lives in [place]. 11th grader. Dog.   Social Determinants of Health   Financial Resource Strain:   . Difficulty of Paying Living Expenses:   Food Insecurity:   . Worried About Charity fundraiser in the Last Year:   . Arboriculturist in the Last Year:   Transportation Needs:   . Film/video editor (Medical):   [name] Lack of Transportation (Non-Medical):   Physical Activity:   . Days of Exercise per Week:   . Minutes of Exercise per Session:   Stress:   . Feeling of Stress :   Social Connections:   . Frequency of Communication with Friends and Family:   . Frequency of Social Gatherings with Friends and Family:   . Attends Religious Services:   . Active Member of Clubs or Organizations:   . Attends Archivist Meetings:   [name] Marital Status:    Additional Social History:    Pain  Medications: Please see MAR Prescriptions: Please see MAR, prozac and cogentin per pt Over the Counter: Please see MAR History of alcohol / drug use?: Yes Longest period of sobriety (when/how long): Unknown Negative Consequences of Use: Legal Name of Substance 1: alcohol 1 - Frequency: UTA 1 - Duration: UTA- pt denies 1 - Last Use / Amount: UTA     Sleep: Good  Appetite:  Good  Current Medications: Current Facility-Administered Medications  Medication Dose Route Frequency Provider Last Rate Last Admin  . acetaminophen (TYLENOL) tablet 650 mg  650 mg Oral Q6H PRN Emmaline Kluver, FNP      . alum & mag hydroxide-simeth (MAALOX/MYLANTA) 200-200-20 MG/5ML suspension  30 mL  30 mL Oral Q4H PRN Emmaline Kluver, FNP      . benztropine (COGENTIN) tablet 1 mg  1 mg Oral BID PRN Cobos, Myer Peer, MD   1 mg at 10/27/19 1644  . FLUoxetine (PROZAC) capsule 40 mg  40 mg Oral Daily Cobos, Myer Peer, MD   40 mg at 11/03/19 0826  . gabapentin (NEURONTIN) capsule 100 mg  100 mg Oral TID Cobos, Myer Peer, MD   100 mg at 11/03/19 0825  . haloperidol (HALDOL) tablet 5 mg  5 mg Oral Q6H PRN Cobos, Myer Peer, MD   5 mg at 11/03/19 8938   Or  . haloperidol lactate (HALDOL) injection 5 mg  5 mg Intramuscular Q6H PRN Cobos, Myer Peer, MD      . LORazepam (ATIVAN) tablet 2 mg  2 mg Oral Q6H PRN Cobos, Myer Peer, MD   2 mg at 11/03/19 1017   Or  . LORazepam (ATIVAN) injection 2 mg  2 mg Intramuscular Q6H PRN Cobos, Myer Peer, MD   2 mg at 10/31/19 1018  . magnesium hydroxide (MILK OF MAGNESIA) suspension 30 mL  30 mL Oral Daily PRN Emmaline Kluver, FNP      . nicotine polacrilex (NICORETTE) gum 2 mg  2 mg Oral PRN Cobos, Myer Peer, MD   2 mg at 11/03/19 0827  . paliperidone (INVEGA) 24 hr tablet 9 mg  9 mg Oral Daily [name], MD   9 mg at 11/03/19 0824  . traZODone (DESYREL) tablet 50 mg  50 mg Oral QHS PRN [name], NP   50 mg at 10/29/19 2109    Lab Results:  Results for orders placed or  performed during the hospital encounter of 10/15/19 (from the past 48 hour(s))  Urinalysis, Complete w Microscopic     Status: Abnormal   Collection Time: 11/02/19 11:11 AM  Result Value Ref Range   Color, Urine YELLOW YELLOW   APPearance CLOUDY (A) CLEAR   Specific Gravity, Urine 1.015 1.005 - 1.030   pH 7.0 5.0 - 8.0   Glucose, UA NEGATIVE NEGATIVE mg/dL   Hgb urine dipstick NEGATIVE NEGATIVE   Bilirubin Urine NEGATIVE NEGATIVE   Ketones, ur NEGATIVE NEGATIVE mg/dL   Protein, ur NEGATIVE NEGATIVE mg/dL   Nitrite NEGATIVE NEGATIVE   Leukocytes,Ua NEGATIVE NEGATIVE   RBC / HPF 0-5 0 - 5 RBC/hpf   WBC, UA 0-5 0 - 5 WBC/hpf   Bacteria, UA NONE SEEN NONE SEEN   Squamous Epithelial / LPF 6-10 0 - 5   Mucus PRESENT    Amorphous Crystal PRESENT     Comment: Performed at [facility], [place] [address] [place], [place] [address]    Blood Alcohol level:  Lab Results  Component Value Date   [facility] <10 10/16/2019   ETH <10 85/27/7824    Metabolic Disorder Labs: Lab Results  Component Value Date   HGBA1C 5.4 10/16/2019   MPG 108.28 10/16/2019   MPG 102.54 05/17/2019   Lab Results  Component Value Date   PROLACTIN 152.0 (H) 10/16/2019   PROLACTIN 31.9 (H) 02/17/2019   Lab Results  Component Value Date   CHOL 138 10/16/2019   TRIG 60 10/16/2019   HDL 36 (L) 10/16/2019   CHOLHDL 3.8 10/16/2019   VLDL 12 10/16/2019   LDLCALC 90 10/16/2019   LDLCALC 96 05/17/2019    Physical Findings: AIMS: Facial and Oral Movements Muscles of Facial Expression: None, normal Lips and Perioral Area:  None, normal Jaw: None, normal Tongue: None, normal,Extremity Movements Upper (arms, wrists, hands, fingers): None, normal Lower (legs, knees, ankles, toes): None, normal, Trunk Movements Neck, shoulders, hips: None, normal, Overall Severity Severity of abnormal movements (highest score from questions above): None, normal Incapacitation due to abnormal movements: None,  normal Patient's awareness of abnormal movements (rate only patient's report): No Awareness, Dental Status Current problems with teeth and/or dentures?: No Does patient usually wear dentures?: No  CIWA:  CIWA-Ar Total: 1 COWS:  COWS Total Score: 2  Musculoskeletal: Strength & Muscle Tone: within normal limits Gait & Station: normal Patient leans: N/A  Psychiatric Specialty Exam: Physical Exam  Nursing note and vitals reviewed. Constitutional: She is oriented to person, place, and time. She appears well-developed and well-nourished.  HENT:  Head: Normocephalic and atraumatic.  Respiratory: Effort normal.  Musculoskeletal:        General: Normal range of motion.  Neurological: She is alert and oriented to person, place, and time.    Review of Systems  HENT: Negative for congestion and sore throat.   Respiratory: Negative for cough and shortness of breath.   Cardiovascular: Negative for chest pain.  Gastrointestinal: Negative for constipation, diarrhea, nausea and vomiting.  Neurological: Negative for dizziness and headaches.  All other systems reviewed and are negative.   Blood pressure 112/77, pulse (!) 126, temperature (!) 97.4 F (36.3 C), temperature source Oral, resp. rate 18, height _0  (1.6 m), weight 67.8 kg, SpO2 98 %.Body mass index is 26.48 kg/m.  General Appearance: Casual  Eye Contact:  Fair  Speech:  Normal Rate  Volume:  Decreased  Mood:  Anxious and Dysphoric  Affect:  Congruent  Thought Process:  Goal Directed and Descriptions of Associations: Circumstantial  Orientation:  Full (Time, Place, and Person)  Thought Content:  Delusions and Hallucinations: Auditory Denies  Suicidal Thoughts:  No  Homicidal Thoughts:  No  Memory:  Immediate;   Fair Recent;   Fair Remote;   Fair  Judgement:  Intact  Insight:  Fair  Psychomotor Activity:  Normal  Concentration:  Concentration: Fair and Attention Span: Fair  Recall:  [employer] of Knowledge:  Fair   Language:  Fair  Akathisia:  Negative  Handed:  Right  AIMS (if indicated):     Assets:  Desire for Improvement Resilience  ADL's:  Intact  Cognition:  WNL  Sleep:  Number of Hours: 6.75     Treatment Plan Summary: Daily contact with patient to assess and evaluate symptoms and progress in treatment, Medication management and Plan : Patient is seen and examined.  [name] with the above-stated past psychiatric history who is seen in follow-up.   Diagnosis: #1 psychosis: Schizoaffective disorder versus schizophrenia versus substance-induced psychotic disorder,  #2 methamphetamine dependence in partial remission,  #3 unspecified depression,  #4 urinary tract infection (resolved)   1.  Continue Cogentin 1 mg p.o. twice daily as needed tremors and EPS. 2.  Continue fluoxetine 40 mg p.o. daily for depression and anxiety. 3.  Continue gabapentin 100 mg p.o. 3 times daily for anxiety and mood stability. 4.  Continue Haldol as needed and lorazepam as needed for agitation. 5.  Continue paliperidone 9 mg p.o. daily for psychosis. 6.  Continue trazodone 50 mg p.o. nightly as needed insomnia. 8.  Disposition planning-in progress.  Rozetta Nunnery, NP 11/03/2019, 9:45 AM

## 2019-11-03 NOTE — Progress Notes (Addendum)
D:  Patient's self inventory sheet, patient sleeps good, no sleep medication.  Good appetite, normal energy level, good concentration.  Denied depression, hopeless and anxiety.  Denied withdrawals.  Denied SI.  Denied physical problems.  Denied physical pain.  Goal is ten coping skills for depression.  Plans to find 10 coping skills.  No discharge plans. A:  Medications administered per MD orders.  Emotional support and encouragement given patient. R:  Denied SI and HI, contracts for safety.  Denied visual hallucinations. Stated she does hear random voices.  Safety maintained with 15 minute checks.

## 2019-11-03 NOTE — Progress Notes (Signed)
Pt has been sleep the entire shift. Pt safely maintained on the unit.

## 2019-11-03 NOTE — Progress Notes (Signed)
Recreation Therapy Notes  Date:  4.21.21 Time: 0930 Location: [address] Group Room  Group Topic: Stress Management  Goal Area(s) Addresses:  Patient will identify positive stress management techniques. Patient will identify benefits of using stress management post d/c.  Behavioral Response: Engaged  Intervention: Stress Management  Activity :  Guided Imagery.  LRT read a script that took patients on a walk along the beach.  Patients were to listen and follow along as script was read to engage in activity.  Education:  Stress Management, Discharge Planning.   Education Outcome: Acknowledges Education  Clinical Observations/Feedback: Pt attended and participated in activity.    [name], LRT/CTRS    Ria Comment, Stefan Markarian A 11/03/2019 11:08 AM

## 2019-11-04 DIAGNOSIS — R4589 Other symptoms and signs involving emotional state: Secondary | ICD-10-CM | POA: Diagnosis not present

## 2019-11-04 NOTE — Progress Notes (Signed)
Pt in bed the entire shift. Awake for a few minutes throughout the night. Pt safety maintained on the unit.

## 2019-11-04 NOTE — Progress Notes (Signed)
   11/04/19 1400  Psych Admission Type (Psych Patients Only)  Admission Status Voluntary  Psychosocial Assessment  Patient Complaints Anxiety  Eye Contact Fair  Facial Expression Flat  Affect Appropriate to circumstance  Speech Logical/coherent  Interaction Minimal  Motor Activity Slow  Appearance/Hygiene Unremarkable  Behavior Characteristics Anxious  Mood Anxious  Thought Process  Coherency WDL  Content WDL  Delusions Paranoid  Perception Hallucinations  Hallucination Auditory  Judgment Impaired  Confusion None  Danger to Self  Current suicidal ideation? Denies  Self-Injurious Behavior No self-injurious ideation or behavior indicators observed or expressed   Agreement Not to Harm Self Yes  Description of Agreement verbal  Danger to Others  Danger to Others None reported or observed

## 2019-11-04 NOTE — Progress Notes (Signed)
[facility] MD Progress Note  11/04/2019 10:25 AM Julia [name]  MRN:  [id number] Subjective:  [name] with a past psychiatric history significant for schizophrenia, unspecified depression, methamphetamine use disorder in partial remission who was admitted on 10/16/2019 after she had left a group home, and developed suicidal ideation, auditory hallucinations and paranoia.  Objective: Patient is seen and examined.  [name] with the above-stated past psychiatric history who is seen in follow-up.  She stated today she had some mild auditory hallucinations yesterday, but denies any today.  She stated she did not recall what they said to her.  She denied any suicidal ideation.  She still remains somewhat guarded and I would think that it is paranoia, but really is at or near her baseline.  We discussed the current complications with the social issues, and I discussed with social work yesterday afternoon about her housing issue as well as her guardianship issues.  Her vital signs are stable, she is afebrile.  She slept 6.25 hours last night.  Principal Problem: Schizophrenia, paranoid ([place]) Diagnosis: Principal Problem:   Schizophrenia, paranoid ([place]) Active Problems:   Depressed mood   Psychosis ([place])   MDD (major depressive disorder)  Total Time spent with patient: 15 minutes  Past Psychiatric History: See admission H&P  Past Medical History:  Past Medical History:  Diagnosis Date  . Burning with urination 05/03/2015  . Contraceptive management 05/03/2015  . Depression   . Dysmenorrhea 12/30/2013  . Heroin addiction ([place])   . Menorrhagia 12/30/2013  . Menstrual extraction 12/30/2013  . Migraines   . Social anxiety disorder 09/25/2016  . Suicidal ideations   . Vaginal odor 05/03/2015    Past Surgical History:  Procedure Laterality Date  . NO PAST SURGERIES     Family History:  Family History  Problem Relation Age of Onset  . Depression Mother   .  Hypertension Father   . Hyperlipidemia Father   . Cancer Paternal Grandmother        breast, uterine  . Cirrhosis Paternal Grandfather        due to alcohol   Family Psychiatric  History: See admission H&P Social History:  Social History   Substance and Sexual Activity  Alcohol Use Yes   Comment: BAC was clear     Social History   Substance and Sexual Activity  Drug Use Yes  . Types: Marijuana, Oxycodone   Comment: reports oxycodone and marijuana    Social History   Socioeconomic History  . Marital status: Single    Spouse name: Not on file  . Number of children: Not on file  . Years of education: Not on file  . Highest education level: Not on file  Occupational History  . Occupation: Unemployed  Tobacco Use  . Smoking status: Current Every Day Smoker    Packs/day: 0.50    Types: Cigarettes  . Smokeless tobacco: Never Used  . Tobacco comment: Not ready to quit  Substance and Sexual Activity  . Alcohol use: Yes    Comment: BAC was clear  . Drug use: Yes    Types: Marijuana, Oxycodone    Comment: reports oxycodone and marijuana  . Sexual activity: Yes    Birth control/protection: Implant  Other Topics Concern  . Not on file  Social History Narrative   06/23/2019:  Pt stated that she is homeless, that she is a high school graduate, and that she is unemployed and not followed by any outpatient  provider.      Lives with Dad. Mom has LGD, lives in [place]. 11th grader. Dog.   Social Determinants of Health   Financial Resource Strain:   . Difficulty of Paying Living Expenses:   Food Insecurity:   . Worried About Charity fundraiser in the Last Year:   . Arboriculturist in the Last Year:   Transportation Needs:   . Film/video editor (Medical):   [name] Lack of Transportation (Non-Medical):   Physical Activity:   . Days of Exercise per Week:   . Minutes of Exercise per Session:   Stress:   . Feeling of Stress :   Social Connections:   . Frequency of Communication  with Friends and Family:   . Frequency of Social Gatherings with Friends and Family:   . Attends Religious Services:   . Active Member of Clubs or Organizations:   . Attends Archivist Meetings:   [name] Marital Status:    Additional Social History:    Pain Medications: Please see MAR Prescriptions: Please see MAR, prozac and cogentin per pt Over the Counter: Please see MAR History of alcohol / drug use?: Yes Longest period of sobriety (when/how long): Unknown Negative Consequences of Use: Legal Name of Substance 1: alcohol 1 - Frequency: UTA 1 - Duration: UTA- pt denies 1 - Last Use / Amount: UTA                  Sleep: Good  Appetite:  Fair  Current Medications: Current Facility-Administered Medications  Medication Dose Route Frequency Provider Last Rate Last Admin  . acetaminophen (TYLENOL) tablet 650 mg  650 mg Oral Q6H PRN Emmaline Kluver, FNP      . alum & mag hydroxide-simeth (MAALOX/MYLANTA) 200-200-20 MG/5ML suspension 30 mL  30 mL Oral Q4H PRN Emmaline Kluver, FNP      . benztropine (COGENTIN) tablet 1 mg  1 mg Oral BID PRN Cobos, Myer Peer, MD   1 mg at 10/27/19 1644  . FLUoxetine (PROZAC) capsule 40 mg  40 mg Oral Daily Cobos, Myer Peer, MD   40 mg at 11/04/19 0903  . gabapentin (NEURONTIN) capsule 100 mg  100 mg Oral TID Cobos, Myer Peer, MD   100 mg at 11/04/19 0903  . haloperidol (HALDOL) tablet 5 mg  5 mg Oral Q6H PRN Cobos, Myer Peer, MD   5 mg at 11/04/19 0940   Or  . haloperidol lactate (HALDOL) injection 5 mg  5 mg Intramuscular Q6H PRN Cobos, Myer Peer, MD      . LORazepam (ATIVAN) tablet 2 mg  2 mg Oral Q6H PRN Cobos, Myer Peer, MD   2 mg at 11/03/19 1622   Or  . LORazepam (ATIVAN) injection 2 mg  2 mg Intramuscular Q6H PRN Cobos, Myer Peer, MD   2 mg at 10/31/19 1018  . magnesium hydroxide (MILK OF MAGNESIA) suspension 30 mL  30 mL Oral Daily PRN Emmaline Kluver, FNP      . nicotine polacrilex (NICORETTE) gum 2 mg  2 mg Oral PRN Cobos, Myer Peer, MD   2 mg at 11/04/19 0904  . paliperidone (INVEGA) 24 hr tablet 9 mg  9 mg Oral Daily [name], MD   9 mg at 11/04/19 0903  . traZODone (DESYREL) tablet 50 mg  50 mg Oral QHS PRN [name], NP   50 mg at 10/29/19 2109    Lab Results:  Results for orders placed  or performed during the hospital encounter of 10/15/19 (from the past 48 hour(s))  Urinalysis, Complete w Microscopic     Status: Abnormal   Collection Time: 11/02/19 11:11 AM  Result Value Ref Range   Color, Urine YELLOW YELLOW   APPearance CLOUDY (A) CLEAR   Specific Gravity, Urine 1.015 1.005 - 1.030   pH 7.0 5.0 - 8.0   Glucose, UA NEGATIVE NEGATIVE mg/dL   Hgb urine dipstick NEGATIVE NEGATIVE   Bilirubin Urine NEGATIVE NEGATIVE   Ketones, ur NEGATIVE NEGATIVE mg/dL   Protein, ur NEGATIVE NEGATIVE mg/dL   Nitrite NEGATIVE NEGATIVE   Leukocytes,Ua NEGATIVE NEGATIVE   RBC / HPF 0-5 0 - 5 RBC/hpf   WBC, UA 0-5 0 - 5 WBC/hpf   Bacteria, UA NONE SEEN NONE SEEN   Squamous Epithelial / LPF 6-10 0 - 5   Mucus PRESENT    Amorphous Crystal PRESENT     Comment: Performed at [facility], [place] [address], [place], [place] [address]    Blood Alcohol level:  Lab Results  Component Value Date   [facility] <10 10/16/2019   ETH <10 [id number]    Metabolic Disorder Labs: Lab Results  Component Value Date   HGBA1C 5.4 10/16/2019   MPG 108.28 10/16/2019   MPG 102.54 05/17/2019   Lab Results  Component Value Date   PROLACTIN 152.0 (H) 10/16/2019   PROLACTIN 31.9 (H) 02/17/2019   Lab Results  Component Value Date   CHOL 138 10/16/2019   TRIG 60 10/16/2019   HDL 36 (L) 10/16/2019   CHOLHDL 3.8 10/16/2019   VLDL 12 10/16/2019   LDLCALC 90 10/16/2019   LDLCALC 96 05/17/2019    Physical Findings: AIMS: Facial and Oral Movements Muscles of Facial Expression: None, normal Lips and Perioral Area: None, normal Jaw: None, normal Tongue: None, normal,Extremity Movements Upper (arms, wrists, hands,  fingers): None, normal Lower (legs, knees, ankles, toes): None, normal, Trunk Movements Neck, shoulders, hips: None, normal, Overall Severity Severity of abnormal movements (highest score from questions above): None, normal Incapacitation due to abnormal movements: None, normal Patient's awareness of abnormal movements (rate only patient's report): No Awareness, Dental Status Current problems with teeth and/or dentures?: No Does patient usually wear dentures?: No  CIWA:  CIWA-Ar Total: 1 COWS:  COWS Total Score: 2  Musculoskeletal: Strength & Muscle Tone: within normal limits Gait & Station: normal Patient leans: N/A  Psychiatric Specialty Exam: Physical Exam  Nursing note and vitals reviewed. Constitutional: She is oriented to person, place, and time. She appears well-developed and well-nourished.  HENT:  Head: Normocephalic and atraumatic.  Respiratory: Effort normal.  Neurological: She is alert and oriented to person, place, and time.    Review of Systems  Blood pressure 112/77, pulse (!) 126, temperature (!) 97.4 F (36.3 C), temperature source Oral, resp. rate 18, height 5\' 3"  (1.6 m), weight 67.8 kg, SpO2 98 %.Body mass index is 26.48 kg/m.  General Appearance: Casual  Eye Contact:  Fair  Speech:  Normal Rate  Volume:  Decreased  Mood:  Anxious  Affect:  Flat  Thought Process:  Coherent and Descriptions of Associations: Circumstantial  Orientation:  Full (Time, Place, and Person)  Thought Content:  Hallucinations: Auditory and Paranoid Ideation  Suicidal Thoughts:  No  Homicidal Thoughts:  No  Memory:  Immediate;   Fair Recent;   Fair Remote;   Fair  Judgement:  Intact  Insight:  Fair  Psychomotor Activity:  Decreased  Concentration:  Concentration: Fair and Attention  Span: Fair  Recall:  [employer] of Knowledge:  Fair  Language:  Good  Akathisia:  Negative  Handed:  Right  AIMS (if indicated):     Assets:  Desire for Improvement Resilience  ADL's:  Intact   Cognition:  WNL  Sleep:  Number of Hours: 6.25     Treatment Plan Summary: Daily contact with patient to assess and evaluate symptoms and progress in treatment, Medication management and Plan : Patient is seen and examined.  [name] with the above-stated past psychiatric history who is seen in follow-up.   Diagnosis: #1 psychosis: Schizoaffective disorder versus schizophrenia versus substance-induced psychotic disorder, #2 methamphetamine dependence in partial remission, #3 unspecified depression, #4 urinary tract infection (resolved)  Patient is seen in follow-up.  She is basically at her baseline.  She has some mild auditory hallucinations and some continued guardedness as well as mild paranoia.  No suicidal or homicidal ideation.  She denied any side effects to her current medications.  Repeat urinalysis showed essentially normal urine with no evidence of infection.  Social issues continue with placement and guardianship.  No change in her medications today.  1.  Continue Cogentin 1 mg p.o. twice daily as needed tremors and EPS. 2.  Continue fluoxetine 40 mg p.o. daily for depression and anxiety. 3.  Continue gabapentin 100 mg p.o. 3 times daily for anxiety and mood stability. 4.  Continue Haldol as needed and lorazepam as needed for agitation. 5.  Continue paliperidone 9 mg p.o. daily for psychosis. 6.  Continue trazodone 50 mg p.o. nightly as needed insomnia. 7.  Disposition planning-in progress.   Sharma Covert, MD 11/04/2019, 10:25 AM

## 2019-11-05 DIAGNOSIS — F200 Paranoid schizophrenia: Secondary | ICD-10-CM | POA: Diagnosis not present

## 2019-11-05 MED ORDER — LORAZEPAM 1 MG PO TABS
1.0000 mg | ORAL_TABLET | Freq: Four times a day (QID) | ORAL | Status: DC | PRN
  Administered 2019-11-06 – 2019-11-25 (×30): 1 mg via ORAL
  Filled 2019-11-05 (×30): qty 1

## 2019-11-05 MED ORDER — PROPRANOLOL HCL 10 MG PO TABS
10.0000 mg | ORAL_TABLET | Freq: Three times a day (TID) | ORAL | Status: DC
  Administered 2019-11-05 – 2019-11-14 (×26): 10 mg via ORAL
  Filled 2019-11-05 (×34): qty 1

## 2019-11-05 MED ORDER — LORAZEPAM 2 MG/ML IJ SOLN: 1.0000 mg | Freq: Four times a day (QID) | INTRAMUSCULAR | Status: DC | PRN

## 2019-11-05 MED ORDER — GABAPENTIN 300 MG PO CAPS
300.0000 mg | ORAL_CAPSULE | Freq: Three times a day (TID) | ORAL | Status: DC
  Administered 2019-11-05 – 2019-11-17 (×36): 300 mg via ORAL
  Filled 2019-11-05 (×44): qty 1

## 2019-11-05 NOTE — Progress Notes (Signed)
Supervisor spoke with [name], group home [facility] [address]  [place], [place] [address]   [phone]  We discussed the LOG payment arrangement.  [name] said she would [phone] per month and this was confirmed as acceptable by [name], [facility] director.  Ms. Clydene Laming has the referral but will not be able to discuss accepting the pt to her program until Monday, 4/26.   [name], MSW, LCSW Advanced Care Supervisor 11/05/2019 2:42 PM

## 2019-11-05 NOTE — BHH Counselor (Signed)
CSW followed up with the referral to [place] with Lucy Antigua. Ms.Woods has not reviewed FL-2 and referral information, she stated she would likely review this referral over the weekend with her Q.P. and call on Monday with a decision.  Patient was declined by this group home earlier, while at [facility], but hopefully she will reconsider.   [name], MSW, LCSW-A Clinical Social Worker [facility]

## 2019-11-05 NOTE — Tx Team (Signed)
Interdisciplinary Treatment and Diagnostic Plan Update  11/05/2019 Time of Session: 8:45am  Julia [name] MRN: [id number]  Principal Diagnosis: Schizophrenia, paranoid [facility])  Secondary Diagnoses: Principal Problem:   Schizophrenia, paranoid ([place]) Active Problems:   Depressed mood   Psychosis ([place])   MDD (major depressive disorder)   Current Medications:  Current Facility-Administered Medications  Medication Dose Route Frequency Provider Last Rate Last Admin  . acetaminophen (TYLENOL) tablet 650 mg  650 mg Oral Q6H PRN Emmaline Kluver, FNP      . alum & mag hydroxide-simeth (MAALOX/MYLANTA) 200-200-20 MG/5ML suspension 30 mL  30 mL Oral Q4H PRN Emmaline Kluver, FNP      . benztropine (COGENTIN) tablet 1 mg  1 mg Oral BID PRN Cobos, Myer Peer, MD   1 mg at 10/27/19 1644  . FLUoxetine (PROZAC) capsule 40 mg  40 mg Oral Daily Cobos, Myer Peer, MD   40 mg at 11/05/19 0900  . gabapentin (NEURONTIN) capsule 100 mg  100 mg Oral TID Cobos, Fernando A, MD   100 mg at 11/05/19 0900  . haloperidol (HALDOL) tablet 5 mg  5 mg Oral Q6H PRN Cobos, Myer Peer, MD   5 mg at 11/04/19 0940   Or  . haloperidol lactate (HALDOL) injection 5 mg  5 mg Intramuscular Q6H PRN Cobos, Myer Peer, MD      . LORazepam (ATIVAN) tablet 2 mg  2 mg Oral Q6H PRN Cobos, Myer Peer, MD   2 mg at 11/04/19 1147   Or  . LORazepam (ATIVAN) injection 2 mg  2 mg Intramuscular Q6H PRN Cobos, Myer Peer, MD   2 mg at 10/31/19 1018  . magnesium hydroxide (MILK OF MAGNESIA) suspension 30 mL  30 mL Oral Daily PRN Emmaline Kluver, FNP      . nicotine polacrilex (NICORETTE) gum 2 mg  2 mg Oral PRN Cobos, Myer Peer, MD   2 mg at 11/04/19 1147  . paliperidone (INVEGA) 24 hr tablet 9 mg  9 mg Oral Daily [name], MD   9 mg at 11/05/19 0900  . traZODone (DESYREL) tablet 50 mg  50 mg Oral QHS PRN [name], NP   50 mg at 10/29/19 2109   PTA Medications: Medications Prior to Admission  Medication Sig Dispense Refill Last Dose  .  ibuprofen (ADVIL) 400 MG tablet Take 400 mg by mouth every 6 (six) hours as needed for headache or mild pain.     . benztropine (COGENTIN) 1 MG tablet Take 1 tablet (1 mg total) by mouth 2 (two) times daily. (Patient not taking: Reported on 10/16/2019) 60 tablet 1 Not Taking at Unknown time  . FLUoxetine (PROZAC) 20 MG capsule Take 1 capsule (20 mg total) by mouth daily. (Patient not taking: Reported on 10/16/2019) 30 capsule 1 Not Taking at Unknown time  . gabapentin (NEURONTIN) 400 MG capsule Take 1 capsule (400 mg total) by mouth 3 (three) times daily. (Patient not taking: Reported on 10/16/2019) 90 capsule 1 Not Taking at Unknown time  . haloperidol decanoate (HALDOL DECANOATE) 100 MG/ML injection Inject 0.5 mLs (50 mg total) into the muscle every 30 (thirty) days. NEXT DOSE DUE AROUND September 15, 2019 (Patient not taking: Reported on 10/16/2019) 1 mL 1 Not Taking at Unknown time  . paliperidone (INVEGA SUSTENNA) 156 MG/ML SUSY injection Inject 1 mL (156 mg total) into the muscle every 30 (thirty) days. NEXT DOSE DUE AROUND September 15, 2019 (Patient not taking: Reported on 10/16/2019) 1.2 mL 1  Not Taking at Unknown time  . paliperidone (INVEGA) 3 MG 24 hr tablet Take 1 tablet (3 mg total) by mouth daily. (Patient not taking: Reported on 10/16/2019) 30 tablet 1 Not Taking at Unknown time  . traZODone (DESYREL) 50 MG tablet Take 1 tablet (50 mg total) by mouth at bedtime. (Patient not taking: Reported on 10/16/2019) 30 tablet 1 Not Taking at Unknown time    Patient Stressors:    Patient Strengths:    Treatment Modalities: Medication Management, Group therapy, Case management,  1 to 1 session with clinician, Psychoeducation, Recreational therapy.   Physician Treatment Plan for Primary Diagnosis: Schizophrenia, paranoid ([place]) Long Term Goal(s): Improvement in symptoms so as ready for discharge Improvement in symptoms so as ready for discharge   Short Term Goals: Ability to identify changes in lifestyle to reduce  recurrence of condition will improve Ability to verbalize feelings will improve Ability to disclose and discuss suicidal ideas Ability to demonstrate self-control will improve Ability to identify and develop effective coping behaviors will improve Ability to maintain clinical measurements within normal limits will improve Compliance with prescribed medications will improve Ability to identify changes in lifestyle to reduce recurrence of condition will improve Ability to verbalize feelings will improve Ability to disclose and discuss suicidal ideas Ability to demonstrate self-control will improve Ability to identify and develop effective coping behaviors will improve Ability to maintain clinical measurements within normal limits will improve Compliance with prescribed medications will improve  Medication Management: Evaluate patient's response, side effects, and tolerance of medication regimen.  Therapeutic Interventions: 1 to 1 sessions, Unit Group sessions and Medication administration.  Evaluation of Outcomes: Progressing  Physician Treatment Plan for Secondary Diagnosis: Principal Problem:   Schizophrenia, paranoid ([place]) Active Problems:   Depressed mood   Psychosis ([place])   MDD (major depressive disorder)  Long Term Goal(s): Improvement in symptoms so as ready for discharge Improvement in symptoms so as ready for discharge   Short Term Goals: Ability to identify changes in lifestyle to reduce recurrence of condition will improve Ability to verbalize feelings will improve Ability to disclose and discuss suicidal ideas Ability to demonstrate self-control will improve Ability to identify and develop effective coping behaviors will improve Ability to maintain clinical measurements within normal limits will improve Compliance with prescribed medications will improve Ability to identify changes in lifestyle to reduce recurrence of condition will improve Ability to verbalize  feelings will improve Ability to disclose and discuss suicidal ideas Ability to demonstrate self-control will improve Ability to identify and develop effective coping behaviors will improve Ability to maintain clinical measurements within normal limits will improve Compliance with prescribed medications will improve     Medication Management: Evaluate patient's response, side effects, and tolerance of medication regimen.  Therapeutic Interventions: 1 to 1 sessions, Unit Group sessions and Medication administration.  Evaluation of Outcomes: Progressing   RN Treatment Plan for Primary Diagnosis: Schizophrenia, paranoid ([place]) Long Term Goal(s): Knowledge of disease and therapeutic regimen to maintain health will improve  Short Term Goals: Ability to participate in decision making will improve, Ability to verbalize feelings will improve, Ability to disclose and discuss suicidal ideas, Ability to identify and develop effective coping behaviors will improve and Compliance with prescribed medications will improve  Medication Management: RN will administer medications as ordered by provider, will assess and evaluate patient's response and provide education to patient for prescribed medication. RN will report any adverse and/or side effects to prescribing provider.  Therapeutic Interventions: 1 on 1 counseling sessions, Psychoeducation,  Medication administration, Evaluate responses to treatment, Monitor vital signs and CBGs as ordered, Perform/monitor CIWA, COWS, AIMS and Fall Risk screenings as ordered, Perform wound care treatments as ordered.  Evaluation of Outcomes: Progressing   LCSW Treatment Plan for Primary Diagnosis: Schizophrenia, paranoid ([place]) Long Term Goal(s): Safe transition to appropriate next level of care at discharge, Engage patient in therapeutic group addressing interpersonal concerns.  Short Term Goals: Engage patient in aftercare planning with referrals and resources and  Increase skills for wellness and recovery  Therapeutic Interventions: Assess for all discharge needs, 1 to 1 time with Social worker, Explore available resources and support systems, Assess for adequacy in community support network, Educate family and significant other(s) on suicide prevention, Complete Psychosocial Assessment, Interpersonal group therapy.  Evaluation of Outcomes: Progressing   Progress in Treatment: Attending groups: Yes. Participating in groups: Yes. Taking medication as prescribed: Yes. Toleration medication: Yes. Family/Significant other contact made: Yes, individual(s) contacted:  with pt's father  Patient understands diagnosis: Yes. Discussing patient identified problems/goals with staff: Yes. Medical problems stabilized or resolved: Yes. Denies suicidal/homicidal ideation: Yes. Issues/concerns per patient self-inventory: No. Other:   New problem(s) identified: Yes, Describe:  group home placement   New Short Term/Long Term Goal(s): Medication stabilization, elimination of SI thoughts, and development of a comprehensive mental wellness plan.   Patient Goals:    Discharge Plan or Barriers: CSW will continue to follow up for appropriate referrals and possible discharge planning  Reason for Continuation of Hospitalization: Anxiety Delusions  Medication stabilization  Estimated Length of Stay: TBD  Attendees: Patient:  10/22/2019   Physician: Dr. Jake Samples, MD; Dr. Neita Garnet, MD; Dr. Myles Lipps, MD 10/22/2019   Nursing: Gilberto Better., RN 10/22/2019   RN Care Manager: 10/22/2019   Social Worker: Lurline Idol, LCSW; Radonna Ricker, LCSW 10/22/2019   Recreational Therapist:  10/22/2019   Other: Ovidio Kin, MSW intern  10/22/2019   Other:  10/22/2019   Other: 10/22/2019      Scribe for Treatment Team: Marylee Floras, [place] 11/05/2019 11:21 AM

## 2019-11-05 NOTE — Progress Notes (Signed)
Recreation Therapy Notes  Date:  4.23.21 Time: 0930 Location: [address] Group Room  Group Topic: Stress Management  Goal Area(s) Addresses:  Patient will identify positive stress management techniques. Patient will identify benefits of using stress management post d/c.  Behavioral Response:  Engaged  Intervention: Stress Management  Activity: Meditation.  LRT played a meditation that focused on taking on the characteristics of a mountain.  Patients were to listen and follow along as meditation played to engage in activity.  Education:  Stress Management, Discharge Planning.   Education Outcome: Acknowledges Education  Clinical Observations/Feedback:  Pt attended and participated in activity.     [name], LRT/CTRS    [name] A 11/05/2019 11:17 AM

## 2019-11-05 NOTE — Progress Notes (Signed)
Pt stayed in bed the entire shift. Pt safely maintained on the unit.

## 2019-11-05 NOTE — Progress Notes (Signed)
   11/05/19 1000  Psych Admission Type (Psych Patients Only)  Admission Status Voluntary  Psychosocial Assessment  Patient Complaints Anxiety  Eye Contact Fair  Facial Expression Flat  Affect Appropriate to circumstance  Speech Logical/coherent  Interaction Minimal  Motor Activity Slow  Appearance/Hygiene Unremarkable  Behavior Characteristics Anxious  Mood Anxious  Thought Process  Coherency WDL  Content WDL  Delusions Paranoid  Perception Hallucinations  Hallucination Auditory  Judgment Impaired  Confusion None  Danger to Self  Current suicidal ideation? Denies  Self-Injurious Behavior No self-injurious ideation or behavior indicators observed or expressed   Agreement Not to Harm Self Yes  Description of Agreement verbal  Danger to Others  Danger to Others None reported or observed

## 2019-11-05 NOTE — [facility] Group Notes (Signed)
11/05/2019 8:45am Type of Group and Topic: Psychoeducational Group: Discharge Planning  Participation Level: Did Not Attend  Description of Group Discharge planning group reviews patient's anticipated discharge plans and assists patients to anticipate and address any barriers to wellness/recovery in the community. Suicide prevention education is reviewed with patients in group. Therapeutic Goals 1. Patients will state their anticipated discharge plan and mental health aftercare 2. Patients will identify potential barriers to wellness in the community setting 3. Patients will engage in problem solving, solution focused discussion of ways to anticipate and address barriers to wellness/recovery   Summary of Patient Progress Plan for Discharge/Comments:  Invited, chose not to attend.     Radonna Ricker, MSW, [place] Worker [facility]  Phone: [phone] 11/05/2019 1:39 PM

## 2019-11-05 NOTE — Progress Notes (Signed)
[facility] MD Progress Note  11/05/2019 12:26 PM Julia [name]  MRN:  [id number]  Subjective: Julia [name] reports, "My mood is good. I'm not depressed today. I just have some anxiety going on. My anxiety is at #5 today".  Objective: [name] with a past psychiatric history significant for schizophrenia, unspecified depression, methamphetamine use disorder in partial remission who was admitted on 10/16/2019 after she had left a group home, and developed suicidal ideation, auditory hallucinations and paranoia. Julia [name] is seen, chart reviewed.  The chart findings discussed with the treatment team. She presents alert, oriented & aware of situation. She is visible on the unit, attending group sessions. She says she is doing well today. Denies any symptoms of depression. Reports having some anxiety going on, rates anxiety #5 in the scale of (1-10). Ten being the worst anxiety & one being the least anxiety. She denies any suicidal ideations.  She still remains somewhat guarded with a flat/restricted affect. She is making fairly good eye contact. Julia [name] at this time is at or near her baseline.  She & the attending psychiatrist/SW had discussed the current complications with the social issues, about her housing as well as her guardianship issues.  Her vital signs remain  stable, she is afebrile.  She is sleeping well & her appetite remains good. Julia [name] at this time does not appear to be responding to any internal stimuli. She is in agreement to continue her current plan of care as already in progress. Denies any side effects.  Principal Problem: Schizophrenia, paranoid ([place])  Diagnosis: Principal Problem:   Schizophrenia, paranoid ([place]) Active Problems:   Depressed mood   Psychosis ([place])   MDD (major depressive disorder)  Total Time spent with patient: 15 minutes  Past Psychiatric History: See admission H&P  Past Medical History:  Past Medical History:  Diagnosis Date  . Burning with urination  05/03/2015  . Contraceptive management 05/03/2015  . Depression   . Dysmenorrhea 12/30/2013  . Heroin addiction ([place])   . Menorrhagia 12/30/2013  . Menstrual extraction 12/30/2013  . Migraines   . Social anxiety disorder 09/25/2016  . Suicidal ideations   . Vaginal odor 05/03/2015    Past Surgical History:  Procedure Laterality Date  . NO PAST SURGERIES     Family History:  Family History  Problem Relation Age of Onset  . Depression Mother   . Hypertension Father   . Hyperlipidemia Father   . Cancer Paternal Grandmother        breast, uterine  . Cirrhosis Paternal Grandfather        due to alcohol   Family Psychiatric  History: See admission H&P  Social History:  Social History   Substance and Sexual Activity  Alcohol Use Yes   Comment: BAC was clear     Social History   Substance and Sexual Activity  Drug Use Yes  . Types: Marijuana, Oxycodone   Comment: reports oxycodone and marijuana    Social History   Socioeconomic History  . Marital status: Single    Spouse name: Not on file  . Number of children: Not on file  . Years of education: Not on file  . Highest education level: Not on file  Occupational History  . Occupation: Unemployed  Tobacco Use  . Smoking status: Current Every Day Smoker    Packs/day: 0.50    Types: Cigarettes  . Smokeless tobacco: Never Used  . Tobacco comment: Not ready to quit  Substance and Sexual Activity  .  Alcohol use: Yes    Comment: BAC was clear  . Drug use: Yes    Types: Marijuana, Oxycodone    Comment: reports oxycodone and marijuana  . Sexual activity: Yes    Birth control/protection: Implant  Other Topics Concern  . Not on file  Social History Narrative   06/23/2019:  Pt stated that she is homeless, that she is a high school graduate, and that she is unemployed and not followed by any outpatient provider.      Lives with Dad. Mom has LGD, lives in [place]. 11th grader. Dog.   Social Determinants of Health   Financial  Resource Strain:   . Difficulty of Paying Living Expenses:   Food Insecurity:   . Worried About Charity fundraiser in the Last Year:   . Arboriculturist in the Last Year:   Transportation Needs:   . Film/video editor (Medical):   [name] Lack of Transportation (Non-Medical):   Physical Activity:   . Days of Exercise per Week:   . Minutes of Exercise per Session:   Stress:   . Feeling of Stress :   Social Connections:   . Frequency of Communication with Friends and Family:   . Frequency of Social Gatherings with Friends and Family:   . Attends Religious Services:   . Active Member of Clubs or Organizations:   . Attends Archivist Meetings:   [name] Marital Status:    Additional Social History:  Pain Medications: Please see MAR Prescriptions: Please see MAR, prozac and cogentin per pt Over the Counter: Please see MAR History of alcohol / drug use?: Yes Longest period of sobriety (when/how long): Unknown Negative Consequences of Use: Legal Name of Substance 1: alcohol 1 - Frequency: UTA 1 - Duration: UTA- pt denies 1 - Last Use / Amount: UTA  Sleep: Good  Appetite:  Good  Current Medications: Current Facility-Administered Medications  Medication Dose Route Frequency Provider Last Rate Last Admin  . acetaminophen (TYLENOL) tablet 650 mg  650 mg Oral Q6H PRN Emmaline Kluver, FNP      . alum & mag hydroxide-simeth (MAALOX/MYLANTA) 200-200-20 MG/5ML suspension 30 mL  30 mL Oral Q4H PRN Emmaline Kluver, FNP      . benztropine (COGENTIN) tablet 1 mg  1 mg Oral BID PRN Cobos, Myer Peer, MD   1 mg at 10/27/19 1644  . FLUoxetine (PROZAC) capsule 40 mg  40 mg Oral Daily Cobos, Myer Peer, MD   40 mg at 11/05/19 0900  . gabapentin (NEURONTIN) capsule 100 mg  100 mg Oral TID Cobos, Fernando A, MD   100 mg at 11/05/19 1137  . haloperidol (HALDOL) tablet 5 mg  5 mg Oral Q6H PRN Cobos, Myer Peer, MD   5 mg at 11/05/19 1136   Or  . haloperidol lactate (HALDOL) injection 5 mg  5 mg  Intramuscular Q6H PRN Cobos, Myer Peer, MD      . LORazepam (ATIVAN) tablet 2 mg  2 mg Oral Q6H PRN Cobos, Myer Peer, MD   2 mg at 11/05/19 1136   Or  . LORazepam (ATIVAN) injection 2 mg  2 mg Intramuscular Q6H PRN Cobos, Myer Peer, MD   2 mg at 10/31/19 1018  . magnesium hydroxide (MILK OF MAGNESIA) suspension 30 mL  30 mL Oral Daily PRN Emmaline Kluver, FNP      . nicotine polacrilex (NICORETTE) gum 2 mg  2 mg Oral PRN Cobos, Myer Peer, MD  2 mg at 11/05/19 1142  . paliperidone (INVEGA) 24 hr tablet 9 mg  9 mg Oral Daily [name], MD   9 mg at 11/05/19 0900  . traZODone (DESYREL) tablet 50 mg  50 mg Oral QHS PRN [name], NP   50 mg at 10/29/19 2109   Lab Results:  No results found for this or any previous visit (from the past 48 hour(s)).  Blood Alcohol level:  Lab Results  Component Value Date   ETH <10 10/16/2019   ETH <10 123456   Metabolic Disorder Labs: Lab Results  Component Value Date   HGBA1C 5.4 10/16/2019   MPG 108.28 10/16/2019   MPG 102.54 05/17/2019   Lab Results  Component Value Date   PROLACTIN 152.0 (H) 10/16/2019   PROLACTIN 31.9 (H) 02/17/2019   Lab Results  Component Value Date   CHOL 138 10/16/2019   TRIG 60 10/16/2019   HDL 36 (L) 10/16/2019   CHOLHDL 3.8 10/16/2019   VLDL 12 10/16/2019   LDLCALC 90 10/16/2019   LDLCALC 96 05/17/2019   Physical Findings: AIMS: Facial and Oral Movements Muscles of Facial Expression: None, normal Lips and Perioral Area: None, normal Jaw: None, normal Tongue: None, normal,Extremity Movements Upper (arms, wrists, hands, fingers): None, normal Lower (legs, knees, ankles, toes): None, normal, Trunk Movements Neck, shoulders, hips: None, normal, Overall Severity Severity of abnormal movements (highest score from questions above): None, normal Incapacitation due to abnormal movements: None, normal Patient's awareness of abnormal movements (rate only patient's report): No Awareness, Dental  Status Current problems with teeth and/or dentures?: No Does patient usually wear dentures?: No  CIWA:  CIWA-Ar Total: 1 COWS:  COWS Total Score: 2  Musculoskeletal: Strength & Muscle Tone: within normal limits Gait & Station: normal Patient leans: N/A  Psychiatric Specialty Exam: Physical Exam  Nursing note and vitals reviewed. Constitutional: She is oriented to person, place, and time. She appears well-developed and well-nourished.  HENT:  Head: Normocephalic and atraumatic.  Cardiovascular:  Elevated pulse rate: 133   Respiratory: Effort normal.  Musculoskeletal:        General: Normal range of motion.     Cervical back: Normal range of motion.  Neurological: She is alert and oriented to person, place, and time.  Skin: Skin is warm.    Review of Systems  Constitutional: Negative for chills, diaphoresis and fever.  HENT: Negative for congestion, rhinorrhea, sneezing and sore throat.   Eyes: Negative for discharge.  Respiratory: Negative for cough, chest tightness, shortness of breath and wheezing.   Cardiovascular: Negative for chest pain and palpitations.  Gastrointestinal: Negative for diarrhea, nausea and vomiting.  Genitourinary: Negative for difficulty urinating.  Musculoskeletal: Negative.   Allergic/Immunologic: Negative for environmental allergies and food allergies.       Allergies: Abilify, Sertraline (Zoloft)  Neurological: Negative for dizziness, tremors, seizures, syncope, light-headedness and headaches.  Psychiatric/Behavioral: Positive for dysphoric mood ("Improving"). Negative for agitation, behavioral problems, confusion, decreased concentration, hallucinations, self-injury, sleep disturbance and suicidal ideas. The patient is nervous/anxious. The patient is not hyperactive.     Blood pressure 98/62, pulse (!) 133, temperature 97.9 F (36.6 C), temperature source Oral, resp. rate 16, height 5\' 3"  (1.6 m), weight 67.8 kg, SpO2 98 %.Body mass index is 26.48  kg/m.  General Appearance: Casual  Eye Contact:  Fair  Speech:  Normal Rate  Volume:  Decreased  Mood:  Anxious, but says mood is improving"  Affect:  Flat  Thought Process:  Coherent and Descriptions of  Associations: Circumstantial  Orientation:  Full (Time, Place, and Person)  Thought Content:  Hallucinations: Auditory and Paranoid Ideation  Suicidal Thoughts:  No  Homicidal Thoughts:  No  Memory:  Immediate;   Fair Recent;   Fair Remote;   Fair  Judgement:  Intact  Insight:  Fair  Psychomotor Activity:  Decreased  Concentration:  Concentration: Fair and Attention Span: Fair  Recall:  [employer] of Knowledge:  Fair  Language:  Good  Akathisia:  Negative  Handed:  Right  AIMS (if indicated):     Assets:  Desire for Improvement Resilience  ADL's:  Intact  Cognition:  WNL  Sleep:  Number of Hours: 6.25   Treatment Plan Summary: Daily contact with patient to assess and evaluate symptoms and progress in treatment, Medication management and Plan : Patient is seen and examined.  [name] with the above-stated past psychiatric history who is seen in follow-up.   Diagnosis:  #1 psychosis: Schizoaffective disorder versus schizophrenia versus substance-induced psychotic disorder,  #2 methamphetamine dependence in partial remission,  #3 unspecified depression,  #4 urinary tract infection (resolved)  Patient is seen for follow-up care assessment.  She is basically at her baseline.  She denies any auditory hallucinations, delusions/paranoia. Denies suicidal or homicidal ideations.  She denies any side effects to her current medications.  Repeated urinalysis showed essentially normal urine with no evidence of infection.  Social issues continue with placement and guardianship.  No change in her medications today.  1.  Continue Cogentin 1 mg p.o. twice daily as needed tremors and EPS. 2.  Continue fluoxetine 40 mg p.o. daily for depression and anxiety. 3.   Continue gabapentin 100 mg p.o. 3 times daily for anxiety and mood stability. 4.  Continue Haldol as needed and lorazepam as needed for agitation. 5.  Continue paliperidone 9 mg p.o. daily for psychosis. 6.  Continue trazodone 50 mg p.o. nightly as needed insomnia. 7.  Disposition planning-in progress. 8. Patient continues to attend group sessions.  Lindell Spar, NP, PMHNP, FNP-BC 11/05/2019, 12:26 PMPatient ID: [name], female   DOB: [birth year], 19 y.o.   MRN: [id number]

## 2019-11-06 DIAGNOSIS — F200 Paranoid schizophrenia: Secondary | ICD-10-CM | POA: Diagnosis not present

## 2019-11-06 NOTE — Progress Notes (Signed)
Psychoeducational Group Note  Date:  11/06/2019 Time: 2030  Group Topic/Focus:  wrap up group  Participation Level: Did Not Attend  Participation Quality:  Not Applicable  Affect:  Not Applicable  Cognitive:  Not Applicable  Insight:  Not Applicable  Engagement in Group: Not Applicable  Additional Comments:  Pt was in bed during group time.   Shellia Cleverly 11/06/2019, 9:17 PM

## 2019-11-06 NOTE — Progress Notes (Signed)
Patient ID: Julia [name], female   DOB: 05-Jun-2001, 19 y.o.   MRN: [id number]   D: Patient has been in room in bed sleeping since coming on shift tonight. Checked her a couple of times. No needs requested. No active SI tonight. Did not need any sleep medications. Did not attend group. A: Staff will continue to monitor on q 15 minute checks during the night R: Staff will assess better when awake and out of bed.

## 2019-11-06 NOTE — [facility] Group Notes (Signed)
Adult Psychoeducational Group Note  Date:  11/06/2019 Time:  12:45 PM  Group Topic/Focus:  Goals Group:   The focus of this group is to help patients establish daily goals to achieve during treatment and discuss how the patient can incorporate goal setting into their daily lives to aide in recovery.  Participation Level:  Minimal  Participation Quality:  Attentive  Affect:  Flat  Cognitive:  Disorganized  Insight: Lacking  Engagement in Group:  Limited  Modes of Intervention:  Activity, Education and Socialization  Additional Comments:  Pt was not able to come up with any goals at all.  Paulino Rily 11/06/2019, 12:45 PM

## 2019-11-06 NOTE — Progress Notes (Signed)
Pt denies SI/HI. Pt denies AVH and does not appear to be responding to internal stimuli. Pt denies depression or anxiety today. Pt reports fair sleep last night. No changes in the pt's behavior noted this morning. Pt compliant with taking medications and no side effects noted.   V/s reviewed. Orders reviewed. Verbal support provided. 15 minute checks performed for safety.  Pt compliant with tx plan.

## 2019-11-06 NOTE — Progress Notes (Signed)
  Pt expressed having paranoia thoughts about the police and requested medication for paranoia. The pt stated "it's my mental disorder that causes the paranoia." The pt was given Haldol as scheduled prn for the paranoia.

## 2019-11-06 NOTE — Progress Notes (Signed)
[facility] MD Progress Note  11/06/2019 11:34 AM Julia [name]  MRN:  [id number]  Subjective: Fancy reports, "I'm doing well today. I have no depression, just a little anxious during group sessions this morning".  Objective: [name] with a past psychiatric history significant for schizophrenia, unspecified depression, methamphetamine use disorder in partial remission who was admitted on 10/16/2019 after she had left a group home, and developed suicidal ideation, auditory hallucinations and paranoia. Julia [name] is seen, chart reviewed.  The chart findings discussed with the treatment team. She presents alert, oriented & aware of situation. She is visible on the unit, attending group sessions. She says she is doing well today. Denies any symptoms of depression. Reports having mild anxiety issues during group sessions this mrning, rates anxiety #2 in the scale of (1-10). Ten being the worst anxiety & one being the least anxiety. She denies any suicidal ideations.  She still remains somewhat guarded with a flat/restricted affect. She is making fairly good eye contact. Julia [name] at this time is at or near her baseline.  She & the attending psychiatrist/SW had discussed the current complications with the social issues, about her housing as well as her guardianship issues.  Her vital signs remain  stable, she is afebrile.  She is sleeping well & her appetite remains good. Julia [name] at this time does not appear to be responding to any internal stimuli. She is in agreement to continue her current plan of care as already in progress. Denies any side effects. Her treatment regimen were adjusted yesterday to meet her needs. See Tx plan.  Principal Problem: Schizophrenia, paranoid ([place])  Diagnosis: Principal Problem:   Schizophrenia, paranoid ([place]) Active Problems:   Depressed mood   Psychosis ([place])   MDD (major depressive disorder)  Total Time spent with patient: 15 minutes  Past Psychiatric History: See  admission H&P  Past Medical History:  Past Medical History:  Diagnosis Date  . Burning with urination 05/03/2015  . Contraceptive management 05/03/2015  . Depression   . Dysmenorrhea 12/30/2013  . Heroin addiction ([place])   . Menorrhagia 12/30/2013  . Menstrual extraction 12/30/2013  . Migraines   . Social anxiety disorder 09/25/2016  . Suicidal ideations   . Vaginal odor 05/03/2015    Past Surgical History:  Procedure Laterality Date  . NO PAST SURGERIES     Family History:  Family History  Problem Relation Age of Onset  . Depression Mother   . Hypertension Father   . Hyperlipidemia Father   . Cancer Paternal Grandmother        breast, uterine  . Cirrhosis Paternal Grandfather        due to alcohol   Family Psychiatric  History: See admission H&P  Social History:  Social History   Substance and Sexual Activity  Alcohol Use Yes   Comment: BAC was clear     Social History   Substance and Sexual Activity  Drug Use Yes  . Types: Marijuana, Oxycodone   Comment: reports oxycodone and marijuana    Social History   Socioeconomic History  . Marital status: Single    Spouse name: Not on file  . Number of children: Not on file  . Years of education: Not on file  . Highest education level: Not on file  Occupational History  . Occupation: Unemployed  Tobacco Use  . Smoking status: Current Every Day Smoker    Packs/day: 0.50    Types: Cigarettes  . Smokeless tobacco: Never Used  .  Tobacco comment: Not ready to quit  Substance and Sexual Activity  . Alcohol use: Yes    Comment: BAC was clear  . Drug use: Yes    Types: Marijuana, Oxycodone    Comment: reports oxycodone and marijuana  . Sexual activity: Yes    Birth control/protection: Implant  Other Topics Concern  . Not on file  Social History Narrative   06/23/2019:  Pt stated that she is homeless, that she is a high school graduate, and that she is unemployed and not followed by any outpatient provider.       Lives with Dad. Mom has LGD, lives in [place]. 11th grader. Dog.   Social Determinants of Health   Financial Resource Strain:   . Difficulty of Paying Living Expenses:   Food Insecurity:   . Worried About Charity fundraiser in the Last Year:   . Arboriculturist in the Last Year:   Transportation Needs:   . Film/video editor (Medical):   [name] Lack of Transportation (Non-Medical):   Physical Activity:   . Days of Exercise per Week:   . Minutes of Exercise per Session:   Stress:   . Feeling of Stress :   Social Connections:   . Frequency of Communication with Friends and Family:   . Frequency of Social Gatherings with Friends and Family:   . Attends Religious Services:   . Active Member of Clubs or Organizations:   . Attends Archivist Meetings:   [name] Marital Status:    Additional Social History:  Pain Medications: Please see MAR Prescriptions: Please see MAR, prozac and cogentin per pt Over the Counter: Please see MAR History of alcohol / drug use?: Yes Longest period of sobriety (when/how long): Unknown Negative Consequences of Use: Legal Name of Substance 1: alcohol 1 - Frequency: UTA 1 - Duration: UTA- pt denies 1 - Last Use / Amount: UTA  Sleep: Good  Appetite:  Good  Current Medications: Current Facility-Administered Medications  Medication Dose Route Frequency Provider Last Rate Last Admin  . acetaminophen (TYLENOL) tablet 650 mg  650 mg Oral Q6H PRN Emmaline Kluver, FNP      . alum & mag hydroxide-simeth (MAALOX/MYLANTA) 200-200-20 MG/5ML suspension 30 mL  30 mL Oral Q4H PRN Emmaline Kluver, FNP      . benztropine (COGENTIN) tablet 1 mg  1 mg Oral BID PRN Cobos, Myer Peer, MD   1 mg at 10/27/19 1644  . FLUoxetine (PROZAC) capsule 40 mg  40 mg Oral Daily Cobos, Myer Peer, MD   40 mg at 11/06/19 0847  . gabapentin (NEURONTIN) capsule 300 mg  300 mg Oral TID Lindell Spar I, NP   300 mg at 11/06/19 0847  . haloperidol (HALDOL) tablet 5 mg  5 mg Oral Q6H PRN Cobos,  Myer Peer, MD   5 mg at 11/05/19 1136   Or  . haloperidol lactate (HALDOL) injection 5 mg  5 mg Intramuscular Q6H PRN Cobos, Myer Peer, MD      . LORazepam (ATIVAN) tablet 1 mg  1 mg Oral Q6H PRN Lindell Spar I, NP   1 mg at 11/06/19 1011   Or  . LORazepam (ATIVAN) injection 1 mg  1 mg Intramuscular Q6H PRN Joann Kulpa I, NP      . magnesium hydroxide (MILK OF MAGNESIA) suspension 30 mL  30 mL Oral Daily PRN Emmaline Kluver, FNP      . nicotine polacrilex (NICORETTE) gum 2 mg  2 mg Oral PRN Cobos, Myer Peer, MD   2 mg at 11/05/19 1142  . paliperidone (INVEGA) 24 hr tablet 9 mg  9 mg Oral Daily [name], MD   9 mg at 11/06/19 0847  . propranolol (INDERAL) tablet 10 mg  10 mg Oral TID Lindell Spar I, NP   10 mg at 11/06/19 0847  . traZODone (DESYREL) tablet 50 mg  50 mg Oral QHS PRN [name], NP   50 mg at 10/29/19 2109   Lab Results:  No results found for this or any previous visit (from the past 48 hour(s)).  Blood Alcohol level:  Lab Results  Component Value Date   ETH <10 10/16/2019   ETH <10 123456   Metabolic Disorder Labs: Lab Results  Component Value Date   HGBA1C 5.4 10/16/2019   MPG 108.28 10/16/2019   MPG 102.54 05/17/2019   Lab Results  Component Value Date   PROLACTIN 152.0 (H) 10/16/2019   PROLACTIN 31.9 (H) 02/17/2019   Lab Results  Component Value Date   CHOL 138 10/16/2019   TRIG 60 10/16/2019   HDL 36 (L) 10/16/2019   CHOLHDL 3.8 10/16/2019   VLDL 12 10/16/2019   LDLCALC 90 10/16/2019   LDLCALC 96 05/17/2019   Physical Findings: AIMS: Facial and Oral Movements Muscles of Facial Expression: None, normal Lips and Perioral Area: None, normal Jaw: None, normal Tongue: None, normal,Extremity Movements Upper (arms, wrists, hands, fingers): None, normal Lower (legs, knees, ankles, toes): None, normal, Trunk Movements Neck, shoulders, hips: None, normal, Overall Severity Severity of abnormal movements (highest score from questions above):  None, normal Incapacitation due to abnormal movements: None, normal Patient's awareness of abnormal movements (rate only patient's report): No Awareness, Dental Status Current problems with teeth and/or dentures?: No Does patient usually wear dentures?: No  CIWA:  CIWA-Ar Total: 1 COWS:  COWS Total Score: 2  Musculoskeletal: Strength & Muscle Tone: within normal limits Gait & Station: normal Patient leans: N/A  Psychiatric Specialty Exam: Physical Exam  Nursing note and vitals reviewed. Constitutional: She is oriented to person, place, and time. She appears well-developed and well-nourished.  HENT:  Head: Normocephalic and atraumatic.  Cardiovascular:  Elevated pulse rate: 133   Respiratory: Effort normal.  Musculoskeletal:        General: Normal range of motion.     Cervical back: Normal range of motion.  Neurological: She is alert and oriented to person, place, and time.  Skin: Skin is warm.    Review of Systems  Constitutional: Negative for chills, diaphoresis and fever.  HENT: Negative for congestion, rhinorrhea, sneezing and sore throat.   Eyes: Negative for discharge.  Respiratory: Negative for cough, chest tightness, shortness of breath and wheezing.   Cardiovascular: Negative for chest pain and palpitations.  Gastrointestinal: Negative for diarrhea, nausea and vomiting.  Genitourinary: Negative for difficulty urinating.  Musculoskeletal: Negative.   Allergic/Immunologic: Negative for environmental allergies and food allergies.       Allergies: Abilify, Sertraline (Zoloft)  Neurological: Negative for dizziness, tremors, seizures, syncope, light-headedness and headaches.  Psychiatric/Behavioral: Positive for dysphoric mood ("Improving"). Negative for agitation, behavioral problems, confusion, decreased concentration, hallucinations, self-injury, sleep disturbance and suicidal ideas. The patient is nervous/anxious. The patient is not hyperactive.     Blood pressure  109/70, pulse (!) 111, temperature 98 F (36.7 C), temperature source Oral, resp. rate 16, height 5\' 3"  (1.6 m), weight 67.8 kg, SpO2 98 %.Body mass index is 26.48 kg/m.  General Appearance: Casual  Eye  Contact:  Fair  Speech:  Normal Rate  Volume:  Decreased  Mood:  Anxious, but says mood is improving, rates anxiety at #2  Affect:  Flat  Thought Process:  Coherent and Descriptions of Associations: Circumstantial  Orientation:  Full (Time, Place, and Person)  Thought Content:  Hallucinations: Auditory and Paranoid Ideation  Suicidal Thoughts:  No  Homicidal Thoughts:  No  Memory:  Immediate;   Fair Recent;   Fair Remote;   Fair  Judgement:  Intact  Insight:  Fair  Psychomotor Activity:  Decreased  Concentration:  Concentration: Fair and Attention Span: Fair  Recall:  [employer] of Knowledge:  Fair  Language:  Good  Akathisia:  Negative  Handed:  Right  AIMS (if indicated):     Assets:  Desire for Improvement Resilience  ADL's:  Intact  Cognition:  WNL  Sleep:  Number of Hours: 6.25   Treatment Plan Summary: Daily contact with patient to assess and evaluate symptoms and progress in treatment, Medication management and Plan : Patient is seen and examined.  [name] with the above-stated past psychiatric history who is seen in follow-up.   - Continue inpatient hospitalization. - Will continue today 11/06/2019 plan as below except where it is noted.  Diagnosis:  #1 psychosis: Schizoaffective disorder versus schizophrenia versus substance-induced psychotic disorder,  #2 methamphetamine dependence in partial remission,  #3 unspecified depression,  #4 urinary tract infection (resolved)  Patient is seen for follow-up care assessment.  She is basically at her baseline.  She denies any auditory hallucinations, delusions/paranoia. Denies suicidal or homicidal ideations.  She denies any side effects to her current medications.  Repeated urinalysis showed  essentially normal urine with no evidence of infection.  Social issues continue with placement and guardianship.  No change in her medications today.  1.  Continue Cogentin 1 mg p.o. twice daily as needed tremors and EPS. 2.  Continue fluoxetine 40 mg p.o. daily for depression and anxiety. 3.  Continue gabapentin 300 mg p.o. 3 times daily for anxiety and mood stability. 4.  Continue Haldol as needed and lorazepam as needed for agitation. 5.  Continue paliperidone 9 mg p.o. daily for psychosis. 6.  Continue trazodone 50 mg p.o. nightly as needed insomnia. 7.  Disposition planning-in progress. 8. Patient continues to attend group sessions. 11. Continue Propranolol 10 mg po tid for anxiety.  Lindell Spar, NP, PMHNP, FNP-BC 11/06/2019, 11:34 AMPatient ID: Julia [name], female   DOB: [birth year], 19 y.o.   MRN: [id number] Patient ID: Julia [name], female   DOB: [birth year], 19 y.o.   MRN: [id number]

## 2019-11-06 NOTE — B[facility] Group Notes (Signed)
LCSW Group Therapy Note  11/06/2019   10:00-11:00am   Type of Therapy and Topic:  Group Therapy: Anger Cues and Responses  Participation Level:  Active   Description of Group:   In this group, patients learned how to recognize the physical, cognitive, emotional, and behavioral responses they have to anger-provoking situations.  They identified a recent time they became angry and how they reacted.  They analyzed how their reaction was possibly beneficial and how it was possibly unhelpful.  The group discussed a variety of healthier coping skills that could help with such a situation in the future.  Focus was placed on how helpful it is to recognize the underlying emotions to our anger, because working on those can lead to a more permanent solution as well as our ability to focus on the important rather than the urgent.  Therapeutic Goals: 1. Patients will remember their last incident of anger and how they felt emotionally and physically, what their thoughts were at the time, and how they behaved. 2. Patients will identify how their behavior at that time worked for them, as well as how it worked against them. 3. Patients will explore possible new behaviors to use in future anger situations. 4. Patients will learn that anger itself is normal and cannot be eliminated, and that healthier reactions can assist with resolving conflict rather than worsening situations.  Summary of Patient Progress:  The patient shared that her most recent time of anger was 1 week ago and said her reaction was to yell at the person she was angry at.  As an explanation of what she was in fact angry at, she said "I hear voices."  Later in group she asked how she can get rid of her PTSD.  She was engaged and attentive, even when she appeared to be confused at times, was tracking the entire group discussion with her eyes.   Therapeutic Modalities:   Cognitive Behavioral Therapy  Maretta Los

## 2019-11-07 DIAGNOSIS — F200 Paranoid schizophrenia: Secondary | ICD-10-CM | POA: Diagnosis not present

## 2019-11-07 NOTE — Progress Notes (Signed)
Psychoeducational Group Note  Date:  11/07/2019 Time:  2030  Group Topic/Focus:  wrap up group  Participation Level: Did Not Attend  Participation Quality:  Not Applicable  Affect:  Not Applicable  Cognitive:  Not Applicable  Insight:  Not Applicable  Engagement in Group: Not Applicable  Additional Comments: Pt was notified that group was beginning but remained in bed.  Shellia Cleverly 11/07/2019, 10:13 PM

## 2019-11-07 NOTE — Progress Notes (Signed)
Pt has been isolative to her room, did not attend the wrap up group. Pt room reported to be smelling emesis, when asked d is she was sick or something, pt denied. Pt remains in bed, denied SI/HI, safety maintained with Q 15 min obs, will continue to monitor.

## 2019-11-07 NOTE — [facility] Group Notes (Signed)
[place] LCSW Group Therapy Note  11/07/2019    Type of Therapy and Topic:  Group Therapy:  Adding Supports Including Yourself  Participation Level:  Minimal   Description of Group:   Patients in this group were introduced to the concept that additional supports including self-support are an essential part of recovery.  Patients listed what supports they believe they need to add to their lives to achieve their goals at discharge, and they listed such things as therapist, family, doctor, support groups, 12-step groups and service animals.   A song entitled "My Own Hero" was played and a group discussion ensued in which patients stated they could relate to the song and it inspired them to realize they have be willing to help themselves in order to succeed, because other people cannot achieve sobriety or stability for them.  "Fight For It" was played, then "I Am Enough" to encourage patients.  They discussed the impact on them and how they must remain convinced that their lives are worth the effort it takes to become sober and/or stable.  Therapeutic Goals: 1)  demonstrate the importance of being a key part of one's own support system 2)  discuss various available supports 3)  encourage patient to use music as part of their self-support and focus on goals 4)  elicit ideas from patients about supports that need to be added   Summary of Patient Progress:  The patient expressed that her healthy supports are her boyfriend, father, and brother.  She said she did not have any unhealthy supports.  She was not an active participant, was in and out of the room several times.   Therapeutic Modalities:   Motivational Interviewing Activity  [name]  8:41 AM

## 2019-11-07 NOTE — Plan of Care (Signed)
Nurse discussed anxiety, depression and coping skills with patient.  

## 2019-11-07 NOTE — Progress Notes (Signed)
[facility] MD Progress Note  11/07/2019 1:03 PM Julia [name]  MRN:  [id number]  Subjective: Julia [name] reports, "I'm Kind of hearing voices today, unable to make out what the voices are saying. My mood is not the best today as a result. I slept well last night. The voices are making [name] anxious".  Objective: [name] with a past psychiatric history significant for schizophrenia, unspecified depression, methamphetamine use disorder in partial remission who was admitted on 10/16/2019 after she had left a group home, and developed suicidal ideation, auditory hallucinations and paranoia. Julia [name] is seen, chart reviewed. The chart findings discussed with the treatment team. She presents alert, oriented & aware of situation. She is visible on the unit, attending group sessions. She says she is not doing too well today because she is hearing some voices. Is unable to make out what the voices are saying. Says her mood is not good as a result. Reports having some anxiety issues as well, rates anxiety #9 in the scale of (1-10). Ten being the worst anxiety & one being the least anxiety. She denies any suicidal ideations. She still remains somewhat guarded with a flat/restricted affect. She is making fairly good eye contact. Julia [name] at this time remains at or near her probable baseline.  She & the attending psychiatrist/SW had discussed the current complications with the social issues, about her housing as well as her guardianship issues.  Her vital signs remain stable, she is afebrile.  She is sleeping well & her appetite remains good. Julia [name] at this time does not appear to be responding to any internal stimuli. She is in agreement to continue her current plan of care as already in progress. Denies any side effects. Her treatment regimen were adjusted to meet her needs. She has been medicated for the anxiety problems. Resting well. See Tx plan.  Principal Problem: Schizophrenia, paranoid ([place])  Diagnosis:  Principal Problem:   Schizophrenia, paranoid ([place]) Active Problems:   Depressed mood   Psychosis ([place])   MDD (major depressive disorder)  Total Time spent with patient: 15 minutes  Past Psychiatric History: See admission H&P  Past Medical History:  Past Medical History:  Diagnosis Date  . Burning with urination 05/03/2015  . Contraceptive management 05/03/2015  . Depression   . Dysmenorrhea 12/30/2013  . Heroin addiction ([place])   . Menorrhagia 12/30/2013  . Menstrual extraction 12/30/2013  . Migraines   . Social anxiety disorder 09/25/2016  . Suicidal ideations   . Vaginal odor 05/03/2015    Past Surgical History:  Procedure Laterality Date  . NO PAST SURGERIES     Family History:  Family History  Problem Relation Age of Onset  . Depression Mother   . Hypertension Father   . Hyperlipidemia Father   . Cancer Paternal Grandmother        breast, uterine  . Cirrhosis Paternal Grandfather        due to alcohol   Family Psychiatric  History: See admission H&P  Social History:  Social History   Substance and Sexual Activity  Alcohol Use Yes   Comment: BAC was clear     Social History   Substance and Sexual Activity  Drug Use Yes  . Types: Marijuana, Oxycodone   Comment: reports oxycodone and marijuana    Social History   Socioeconomic History  . Marital status: Single    Spouse name: Not on file  . Number of children: Not on file  . Years of education: Not on  file  . Highest education level: Not on file  Occupational History  . Occupation: Unemployed  Tobacco Use  . Smoking status: Current Every Day Smoker    Packs/day: 0.50    Types: Cigarettes  . Smokeless tobacco: Never Used  . Tobacco comment: Not ready to quit  Substance and Sexual Activity  . Alcohol use: Yes    Comment: BAC was clear  . Drug use: Yes    Types: Marijuana, Oxycodone    Comment: reports oxycodone and marijuana  . Sexual activity: Yes    Birth control/protection: Implant  Other  Topics Concern  . Not on file  Social History Narrative   06/23/2019:  Pt stated that she is homeless, that she is a high school graduate, and that she is unemployed and not followed by any outpatient provider.      Lives with Dad. Mom has LGD, lives in [place]. 11th grader. Dog.   Social Determinants of Health   Financial Resource Strain:   . Difficulty of Paying Living Expenses:   Food Insecurity:   . Worried About Charity fundraiser in the Last Year:   . Arboriculturist in the Last Year:   Transportation Needs:   . Film/video editor (Medical):   [name] Lack of Transportation (Non-Medical):   Physical Activity:   . Days of Exercise per Week:   . Minutes of Exercise per Session:   Stress:   . Feeling of Stress :   Social Connections:   . Frequency of Communication with Friends and Family:   . Frequency of Social Gatherings with Friends and Family:   . Attends Religious Services:   . Active Member of Clubs or Organizations:   . Attends Archivist Meetings:   [name] Marital Status:    Additional Social History:  Pain Medications: Please see MAR Prescriptions: Please see MAR, prozac and cogentin per pt Over the Counter: Please see MAR History of alcohol / drug use?: Yes Longest period of sobriety (when/how long): Unknown Negative Consequences of Use: Legal Name of Substance 1: alcohol 1 - Frequency: UTA 1 - Duration: UTA- pt denies 1 - Last Use / Amount: UTA  Sleep: Good  Appetite:  Good  Current Medications: Current Facility-Administered Medications  Medication Dose Route Frequency Provider Last Rate Last Admin  . acetaminophen (TYLENOL) tablet 650 mg  650 mg Oral Q6H PRN Emmaline Kluver, FNP      . alum & mag hydroxide-simeth (MAALOX/MYLANTA) 200-200-20 MG/5ML suspension 30 mL  30 mL Oral Q4H PRN Emmaline Kluver, FNP      . benztropine (COGENTIN) tablet 1 mg  1 mg Oral BID PRN Cobos, Myer Peer, MD   1 mg at 10/27/19 1644  . FLUoxetine (PROZAC) capsule 40 mg  40 mg Oral  Daily Cobos, Myer Peer, MD   40 mg at 11/07/19 0748  . gabapentin (NEURONTIN) capsule 300 mg  300 mg Oral TID Lindell Spar I, NP   300 mg at 11/07/19 1141  . haloperidol (HALDOL) tablet 5 mg  5 mg Oral Q6H PRN Cobos, Myer Peer, MD   5 mg at 11/07/19 [id number]   Or  . haloperidol lactate (HALDOL) injection 5 mg  5 mg Intramuscular Q6H PRN Cobos, Myer Peer, MD      . LORazepam (ATIVAN) tablet 1 mg  1 mg Oral Q6H PRN Lindell Spar I, NP   1 mg at 11/07/19 [id number]   Or  . LORazepam (ATIVAN) injection 1 mg  1  mg Intramuscular Q6H PRN Lindell Spar I, NP      . magnesium hydroxide (MILK OF MAGNESIA) suspension 30 mL  30 mL Oral Daily PRN Emmaline Kluver, FNP      . nicotine polacrilex (NICORETTE) gum 2 mg  2 mg Oral PRN Cobos, Myer Peer, MD   2 mg at 11/07/19 1022  . paliperidone (INVEGA) 24 hr tablet 9 mg  9 mg Oral Daily [name], MD   9 mg at 11/07/19 0749  . propranolol (INDERAL) tablet 10 mg  10 mg Oral TID Lindell Spar I, NP   10 mg at 11/07/19 1141  . traZODone (DESYREL) tablet 50 mg  50 mg Oral QHS PRN [name], NP   50 mg at 10/29/19 2109   Lab Results:  No results found for this or any previous visit (from the past 48 hour(s)).  Blood Alcohol level:  Lab Results  Component Value Date   ETH <10 10/16/2019   ETH <10 123456   Metabolic Disorder Labs: Lab Results  Component Value Date   HGBA1C 5.4 10/16/2019   MPG 108.28 10/16/2019   MPG 102.54 05/17/2019   Lab Results  Component Value Date   PROLACTIN 152.0 (H) 10/16/2019   PROLACTIN 31.9 (H) 02/17/2019   Lab Results  Component Value Date   CHOL 138 10/16/2019   TRIG 60 10/16/2019   HDL 36 (L) 10/16/2019   CHOLHDL 3.8 10/16/2019   VLDL 12 10/16/2019   LDLCALC 90 10/16/2019   LDLCALC 96 05/17/2019   Physical Findings: AIMS: Facial and Oral Movements Muscles of Facial Expression: None, normal Lips and Perioral Area: None, normal Jaw: None, normal Tongue: None, normal,Extremity Movements Upper (arms, wrists,  hands, fingers): None, normal Lower (legs, knees, ankles, toes): None, normal, Trunk Movements Neck, shoulders, hips: None, normal, Overall Severity Severity of abnormal movements (highest score from questions above): None, normal Incapacitation due to abnormal movements: None, normal Patient's awareness of abnormal movements (rate only patient's report): No Awareness, Dental Status Current problems with teeth and/or dentures?: No Does patient usually wear dentures?: No  CIWA:  CIWA-Ar Total: 1 COWS:  COWS Total Score: 2  Musculoskeletal: Strength & Muscle Tone: within normal limits Gait & Station: normal Patient leans: N/A  Psychiatric Specialty Exam: Physical Exam  Nursing note and vitals reviewed. Constitutional: She is oriented to person, place, and time. She appears well-developed and well-nourished.  HENT:  Head: Normocephalic and atraumatic.  Cardiovascular:  Elevated pulse rate: 133   Respiratory: Effort normal.  Musculoskeletal:        General: Normal range of motion.     Cervical back: Normal range of motion.  Neurological: She is alert and oriented to person, place, and time.  Skin: Skin is warm.    Review of Systems  Constitutional: Negative for chills, diaphoresis and fever.  HENT: Negative for congestion, rhinorrhea, sneezing and sore throat.   Eyes: Negative for discharge.  Respiratory: Negative for cough, chest tightness, shortness of breath and wheezing.   Cardiovascular: Negative for chest pain and palpitations.  Gastrointestinal: Negative for diarrhea, nausea and vomiting.  Genitourinary: Negative for difficulty urinating.  Musculoskeletal: Negative.   Allergic/Immunologic: Negative for environmental allergies and food allergies.       Allergies: Abilify, Sertraline (Zoloft)  Neurological: Negative for dizziness, tremors, seizures, syncope, light-headedness and headaches.  Psychiatric/Behavioral: Positive for dysphoric mood ("Improving"). Negative for  agitation, behavioral problems, confusion, decreased concentration, hallucinations, self-injury, sleep disturbance and suicidal ideas. The patient is nervous/anxious. The patient is  not hyperactive.     Blood pressure 99/71, pulse 87, temperature 97.7 F (36.5 C), temperature source Oral, resp. rate 16, height 5\' 3"  (1.6 m), weight 67.8 kg, SpO2 98 %.Body mass index is 26.48 kg/m.  General Appearance: Casual  Eye Contact:  Fair  Speech:  Normal Rate  Volume:  Decreased  Mood:  Anxious, but says mood is improving, rates anxiety at #2  Affect:  Flat  Thought Process:  Coherent and Descriptions of Associations: Circumstantial  Orientation:  Full (Time, Place, and Person)  Thought Content:  Hallucinations: Auditory and Paranoid Ideation  Suicidal Thoughts:  No  Homicidal Thoughts:  No  Memory:  Immediate;   Fair Recent;   Fair Remote;   Fair  Judgement:  Intact  Insight:  Fair  Psychomotor Activity:  Decreased  Concentration:  Concentration: Fair and Attention Span: Fair  Recall:  [employer] of Knowledge:  Fair  Language:  Good  Akathisia:  Negative  Handed:  Right  AIMS (if indicated):     Assets:  Desire for Improvement Resilience  ADL's:  Intact  Cognition:  WNL  Sleep:  Number of Hours: 6.25   Treatment Plan Summary: Daily contact with patient to assess and evaluate symptoms and progress in treatment, Medication management and Plan : Patient is seen and examined.  [name] with the above-stated past psychiatric history who is seen in follow-up.   - Continue inpatient hospitalization. - Will continue today 11/07/2019 plan as below except where it is noted.  Diagnosis:  #1 psychosis: Schizoaffective disorder versus schizophrenia versus substance-induced psychotic disorder,  #2 methamphetamine dependence in partial remission,  #3 unspecified depression,  #4 urinary tract infection (resolved)  Patient is seen for follow-up care assessment.  She is  basically at her baseline.  She denies any auditory hallucinations, delusions/paranoia. Denies suicidal or homicidal ideations.  She denies any side effects to her current medications.  Repeated urinalysis showed essentially normal urine with no evidence of infection.  Social issues continue with placement and guardianship.  No change in her medications today.  1.  Continue Cogentin 1 mg p.o. twice daily as needed tremors and EPS. 2.  Continue fluoxetine 40 mg p.o. daily for depression and anxiety. 3.  Continue gabapentin 300 mg p.o. 3 times daily for anxiety and mood stability. 4.  Continue Haldol as needed and lorazepam as needed for agitation. 5.  Continue paliperidone 9 mg p.o. daily for psychosis. 6.  Continue trazodone 50 mg p.o. nightly as needed insomnia. 7.  Disposition planning-in progress. 8.  Patient continues to attend group sessions. 11. Continue Propranolol 10 mg po tid for anxiety.  Lindell Spar, NP, PMHNP, FNP-BC 11/07/2019, 1:03 PMPatient ID: Julia [name], female   DOB: 07-02-2001, 19 y.o.   MRN: [id number] Patient ID: Julia [name], female   DOB: August 17, 2000, 19 y.o.   MRN: [id number] Patient ID: Julia [name], female   DOB: 2000/09/08, 19 y.o.   MRN: [id number]

## 2019-11-07 NOTE — Progress Notes (Signed)
D:  Patient's self inventory sheet, patient sleeps good, no sleep medicine.  Good appetite, normal energy level, good concentration.  Denied depression and hopeless and anxiety.  Denied withdrawals.  Denied SI.  Denied physical problems.  Denied physical pain.  No particular goal today.  No discharge plans. A:  Medications administered per MD orders.  Emotional support and encouragement given patient. R:  Denied SI and HI, contracts for safety.  Denied visual hallucinations.  Does hear voices, mumblings today, cannot distinguish what voices are saying.

## 2019-11-08 DIAGNOSIS — F200 Paranoid schizophrenia: Secondary | ICD-10-CM | POA: Diagnosis not present

## 2019-11-08 MED ORDER — PALIPERIDONE ER 6 MG PO TB24
12.0000 mg | ORAL_TABLET | Freq: Every day | ORAL | Status: DC
  Administered 2019-11-09 – 2019-11-13 (×4): 12 mg via ORAL
  Filled 2019-11-08 (×7): qty 2

## 2019-11-08 MED ORDER — PALIPERIDONE ER 6 MG PO TB24
9.0000 mg | ORAL_TABLET | Freq: Once | ORAL | Status: AC
  Administered 2019-11-08: 21:00:00 9 mg via ORAL
  Filled 2019-11-08 (×2): qty 1

## 2019-11-08 MED ORDER — PALIPERIDONE ER 6 MG PO TB24
12.0000 mg | ORAL_TABLET | Freq: Every day | ORAL | Status: DC
  Filled 2019-11-08 (×2): qty 2

## 2019-11-08 NOTE — Progress Notes (Signed)
   11/08/19 0725  Psych Admission Type (Psych Patients Only)  Admission Status Voluntary  Psychosocial Assessment  Patient Complaints None  Eye Contact Fair  Facial Expression Flat  Affect Appropriate to circumstance  Speech Logical/coherent  Interaction Cautious;Forwards little;Guarded;Minimal  Motor Activity Slow  Appearance/Hygiene Unremarkable  Behavior Characteristics Cooperative;Appropriate to situation  Mood Pleasant  Thought Process  Coherency WDL  Content WDL  Delusions Paranoid  Perception Hallucinations  Hallucination None reported or observed  Judgment WDL  Confusion None  Danger to Self  Current suicidal ideation? Denies  Self-Injurious Behavior No self-injurious ideation or behavior indicators observed or expressed

## 2019-11-08 NOTE — Progress Notes (Signed)
[facility] MD Progress Note  11/08/2019 10:41 AM Julia [name]  MRN:  [id number] Subjective:  "I'm fine."  Julia [name] found sitting in the group room. She continues to present with flat affect, guarded with minimal speech. She reports anxiety has decreased since yesterday, and mood is stable. She denies SI. She denies AVH initially but then admits to continuing auditory hallucinations saying vulgar things. She states AH are bothersome but denies CAH. She also admits to ongoing paranoia that she is being watched/monitored. She denies paranoia toward any particular individuals on the unit. She is frustrated with still being in the hospital related to placement issues. Denies other concerns. She has shown no agitated or disruptive behaviors on the unit.  From admission H&P: [name] with a past psychiatric history significant for schizophrenia, unspecified depression, methamphetamine use disorder in partial remission who was admitted on 10/16/2019 after she had left a group home, and developed suicidal ideation, auditory hallucinations and paranoia.  Principal Problem: Schizophrenia, paranoid ([place]) Diagnosis: Principal Problem:   Schizophrenia, paranoid ([place]) Active Problems:   Depressed mood   Psychosis ([place])   MDD (major depressive disorder)  Total Time spent with patient: 15 minutes  Past Psychiatric History: See admission H&P  Past Medical History:  Past Medical History:  Diagnosis Date  . Burning with urination 05/03/2015  . Contraceptive management 05/03/2015  . Depression   . Dysmenorrhea 12/30/2013  . Heroin addiction ([place])   . Menorrhagia 12/30/2013  . Menstrual extraction 12/30/2013  . Migraines   . Social anxiety disorder 09/25/2016  . Suicidal ideations   . Vaginal odor 05/03/2015    Past Surgical History:  Procedure Laterality Date  . NO PAST SURGERIES     Family History:  Family History  Problem Relation Age of Onset  . Depression Mother   .  Hypertension Father   . Hyperlipidemia Father   . Cancer Paternal Grandmother        breast, uterine  . Cirrhosis Paternal Grandfather        due to alcohol   Family Psychiatric  History: See admission H&P Social History:  Social History   Substance and Sexual Activity  Alcohol Use Yes   Comment: BAC was clear     Social History   Substance and Sexual Activity  Drug Use Yes  . Types: Marijuana, Oxycodone   Comment: reports oxycodone and marijuana    Social History   Socioeconomic History  . Marital status: Single    Spouse name: Not on file  . Number of children: Not on file  . Years of education: Not on file  . Highest education level: Not on file  Occupational History  . Occupation: Unemployed  Tobacco Use  . Smoking status: Current Every Day Smoker    Packs/day: 0.50    Types: Cigarettes  . Smokeless tobacco: Never Used  . Tobacco comment: Not ready to quit  Substance and Sexual Activity  . Alcohol use: Yes    Comment: BAC was clear  . Drug use: Yes    Types: Marijuana, Oxycodone    Comment: reports oxycodone and marijuana  . Sexual activity: Yes    Birth control/protection: Implant  Other Topics Concern  . Not on file  Social History Narrative   06/23/2019:  Pt stated that she is homeless, that she is a high school graduate, and that she is unemployed and not followed by any outpatient provider.      Lives with Dad. Mom has LGD, lives  in [place]. 11th grader. Dog.   Social Determinants of Health   Financial Resource Strain:   . Difficulty of Paying Living Expenses:   Food Insecurity:   . Worried About Charity fundraiser in the Last Year:   . Arboriculturist in the Last Year:   Transportation Needs:   . Film/video editor (Medical):   [name] Lack of Transportation (Non-Medical):   Physical Activity:   . Days of Exercise per Week:   . Minutes of Exercise per Session:   Stress:   . Feeling of Stress :   Social Connections:   . Frequency of Communication  with Friends and Family:   . Frequency of Social Gatherings with Friends and Family:   . Attends Religious Services:   . Active Member of Clubs or Organizations:   . Attends Archivist Meetings:   [name] Marital Status:    Additional Social History:    Pain Medications: Please see MAR Prescriptions: Please see MAR, prozac and cogentin per pt Over the Counter: Please see MAR History of alcohol / drug use?: Yes Longest period of sobriety (when/how long): Unknown Negative Consequences of Use: Legal Name of Substance 1: alcohol 1 - Frequency: UTA 1 - Duration: UTA- pt denies 1 - Last Use / Amount: UTA                  Sleep: Good  Appetite:  Good  Current Medications: Current Facility-Administered Medications  Medication Dose Route Frequency Provider Last Rate Last Admin  . acetaminophen (TYLENOL) tablet 650 mg  650 mg Oral Q6H PRN Emmaline Kluver, FNP      . alum & mag hydroxide-simeth (MAALOX/MYLANTA) 200-200-20 MG/5ML suspension 30 mL  30 mL Oral Q4H PRN Emmaline Kluver, FNP      . benztropine (COGENTIN) tablet 1 mg  1 mg Oral BID PRN Cobos, Myer Peer, MD   1 mg at 10/27/19 1644  . FLUoxetine (PROZAC) capsule 40 mg  40 mg Oral Daily Cobos, Myer Peer, MD   40 mg at 11/08/19 0724  . gabapentin (NEURONTIN) capsule 300 mg  300 mg Oral TID Lindell Spar I, NP   300 mg at 11/08/19 0724  . haloperidol (HALDOL) tablet 5 mg  5 mg Oral Q6H PRN Cobos, Myer Peer, MD   5 mg at 11/07/19 1504   Or  . haloperidol lactate (HALDOL) injection 5 mg  5 mg Intramuscular Q6H PRN Cobos, Myer Peer, MD      . LORazepam (ATIVAN) tablet 1 mg  1 mg Oral Q6H PRN Lindell Spar I, NP   1 mg at 11/07/19 1505   Or  . LORazepam (ATIVAN) injection 1 mg  1 mg Intramuscular Q6H PRN Nwoko, Agnes I, NP      . magnesium hydroxide (MILK OF MAGNESIA) suspension 30 mL  30 mL Oral Daily PRN Emmaline Kluver, FNP      . nicotine polacrilex (NICORETTE) gum 2 mg  2 mg Oral PRN Cobos, Myer Peer, MD   2 mg at 11/08/19  0826  . paliperidone (INVEGA) 24 hr tablet 9 mg  9 mg Oral Daily [name], MD   9 mg at 11/08/19 0724  . propranolol (INDERAL) tablet 10 mg  10 mg Oral TID Lindell Spar I, NP   10 mg at 11/08/19 0732  . traZODone (DESYREL) tablet 50 mg  50 mg Oral QHS PRN [name], NP   50 mg at 10/29/19 2109  Lab Results: No results found for this or any previous visit (from the past 48 hour(s)).  Blood Alcohol level:  Lab Results  Component Value Date   ETH <10 10/16/2019   ETH <10 123456    Metabolic Disorder Labs: Lab Results  Component Value Date   HGBA1C 5.4 10/16/2019   MPG 108.28 10/16/2019   MPG 102.54 05/17/2019   Lab Results  Component Value Date   PROLACTIN 152.0 (H) 10/16/2019   PROLACTIN 31.9 (H) 02/17/2019   Lab Results  Component Value Date   CHOL 138 10/16/2019   TRIG 60 10/16/2019   HDL 36 (L) 10/16/2019   CHOLHDL 3.8 10/16/2019   VLDL 12 10/16/2019   LDLCALC 90 10/16/2019   LDLCALC 96 05/17/2019    Physical Findings: AIMS: Facial and Oral Movements Muscles of Facial Expression: None, normal Lips and Perioral Area: None, normal Jaw: None, normal Tongue: None, normal,Extremity Movements Upper (arms, wrists, hands, fingers): None, normal Lower (legs, knees, ankles, toes): None, normal, Trunk Movements Neck, shoulders, hips: None, normal, Overall Severity Severity of abnormal movements (highest score from questions above): None, normal Incapacitation due to abnormal movements: None, normal Patient's awareness of abnormal movements (rate only patient's report): No Awareness, Dental Status Current problems with teeth and/or dentures?: No Does patient usually wear dentures?: No  CIWA:  CIWA-Ar Total: 1 COWS:  COWS Total Score: 2  Musculoskeletal: Strength & Muscle Tone: within normal limits Gait & Station: normal Patient leans: N/A  Psychiatric Specialty Exam: Physical Exam  Nursing note and vitals reviewed. Constitutional: She is oriented  to person, place, and time. She appears well-developed and well-nourished.  Cardiovascular: Normal rate.  Respiratory: Effort normal.  Neurological: She is alert and oriented to person, place, and time.    Review of Systems  Constitutional: Negative.   Respiratory: Negative for cough and shortness of breath.   Psychiatric/Behavioral: Positive for hallucinations. Negative for agitation, behavioral problems, confusion, dysphoric mood, self-injury, sleep disturbance and suicidal ideas. The patient is not nervous/anxious and is not hyperactive.     Blood pressure (!) 119/101, pulse (!) 120, temperature (!) 97.5 F (36.4 C), temperature source Oral, resp. rate 18, height 5\' 3"  (1.6 m), weight 67.8 kg, SpO2 98 %.Body mass index is 26.48 kg/m.  General Appearance: Casual  Eye Contact:  Good  Speech:  Slow  Volume:  Decreased  Mood:  Euthymic  Affect:  Flat  Thought Process:  Coherent  Orientation:  Full (Time, Place, and Person)  Thought Content:  Hallucinations: Auditory and Paranoid Ideation  Suicidal Thoughts:  No  Homicidal Thoughts:  No  Memory:  Immediate;   Fair Recent;   Fair  Judgement:  Fair  Insight:  Fair  Psychomotor Activity:  Normal  Concentration:  Concentration: Fair and Attention Span: Fair  Recall:  [employer] of Knowledge:  Fair  Language:  Fair  Akathisia:  No  Handed:  Right  AIMS (if indicated):     Assets:  Communication Skills Leisure Time Physical Health  ADL's:  Intact  Cognition:  WNL  Sleep:  Number of Hours: 6.75     Treatment Plan Summary: Daily contact with patient to assess and evaluate symptoms and progress in treatment and Medication management  Continue inpatient hospitalization.  Increase Invega to 12 mg PO daily for psychosis Continue Cogentin 1 mg PO BID PRN tremors/EPS Continue Prozac 40 mg PO daily for depression/anxiety Continue gabapentin 300 mg PO TID for anxiety Continue propranolol 10 mg PO TID for anxiety Continue  trazodone 50 mg PO QHS PRN insomnia Continue Haldol/Ativan PO/IM PRN agitation  Patient will participate in the therapeutic group milieu.  Discharge disposition in progress.   Connye Burkitt, NP 11/08/2019, 10:42 AM

## 2019-11-08 NOTE — Progress Notes (Signed)
Recreation Therapy Notes  Date:  4.26.21 Time: 0930 Location: 300 Hall Dayroom  Group Topic: Stress Management  Goal Area(s) Addresses:  Patient will identify positive stress management techniques. Patient will identify benefits of using stress management post d/c.  Behavioral Response: Engaged  Intervention: Stress Management  Activity: Meditation.  LRT played a meditation that focused on making the most of your day the possibilities it has to offer.  Patients were to listen and follow along as meditation to engage in activity.  Education:  Stress Management, Discharge Planning.   Education Outcome: Acknowledges Education  Clinical Observations/Feedback:  Pt attended and participated in activity.    [name], LRT/CTRS     [name] A 11/08/2019 11:37 AM

## 2019-11-08 NOTE — Progress Notes (Signed)
Progress note  D: pt found in bed compliant with medication administration. Pt continues to be flat/blank in affect. Pt is minimal with assessment and guarded. Pt has complaints of hearing voices later in the day and becoming agitated. PRN medication provided which decreased the voices. Pt denies si/hi/vh and verbally agrees to approach staff if these become apparent or before harming themself/others while at [facility].  A: Pt provided support and encouragement. Pt given medication per protocol and standing orders. Q37m safety checks implemented and continued.  R: Pt safe on the unit. Will continue to monitor.

## 2019-11-08 NOTE — Progress Notes (Signed)
Pt has been resting in bed with even and unlabored respirations. Pt has been very isolative, not getting up out bed. Denied SI/HI and contracted for safety, will continue to monitor.

## 2019-11-08 NOTE — Progress Notes (Signed)
ADULT GRIEF GROUP NOTE:  Spiritual care group on grief and loss facilitated by chaplain Jerene Pitch MDiv, BCC  Group Goal:  Support / Education around grief and loss Members engage in facilitated group support and psycho-social education.  Group Description:  Following introductions and group rules, group members engaged in facilitated group dialog and support around topic of loss, with particular support around experiences of loss in their lives. Group Identified types of loss (relationships / self / things) and identified patterns, circumstances, and changes that precipitate losses. Reflected on thoughts / feelings around loss, normalized grief responses, and recognized variety in grief experience.   Group noted Worden's four tasks of grief in discussion.  Group drew on Adlerian / Rogerian, narrative, MI, Patient Progress:  Julia [name] was in group briefly.  Left group room and did not return.

## 2019-11-08 NOTE — BHH Counselor (Addendum)
CSW attempted to reach [facility] owner, Lucy Antigua [phone]). Ms.Woods was expected to make a decision regarding group home acceptance over the weekend. CSW left a HIPAA compliant voicemail with call back information.  A second call was not answered. CSW reached out to patient's ACT Team to request additional follow up.  [name], MSW, LCSW-A Clinical Social Worker [facility] Adult Unit

## 2019-11-08 NOTE — Progress Notes (Signed)
   11/08/19 2110  Psych Admission Type (Psych Patients Only)  Admission Status Voluntary  Psychosocial Assessment  Patient Complaints Other (Comment) (tired)  Eye Contact Fair  Facial Expression Flat  Affect Flat  Speech Logical/coherent  Interaction Cautious;Forwards little;Minimal  Motor Activity Slow  Appearance/Hygiene Unremarkable  Behavior Characteristics Cooperative;Appropriate to situation  Mood Depressed  Aggressive Behavior  Effect No apparent injury  Thought Process  Coherency WDL  Content WDL  Delusions None reported or observed  Perception WDL  Hallucination None reported or observed  Judgment WDL  Confusion None  Danger to Self  Current suicidal ideation? Denies  Danger to Others  Danger to Others None reported or observed

## 2019-11-09 DIAGNOSIS — R4589 Other symptoms and signs involving emotional state: Secondary | ICD-10-CM | POA: Diagnosis not present

## 2019-11-09 NOTE — Progress Notes (Signed)
   11/09/19 2200  Psych Admission Type (Psych Patients Only)  Admission Status Voluntary  Psychosocial Assessment  Patient Complaints Other (Comment) (tired)  Eye Contact Fair  Facial Expression Flat  Affect Flat  Speech Logical/coherent  Interaction Cautious;Forwards little;Minimal  Motor Activity Slow  Appearance/Hygiene Unremarkable  Behavior Characteristics Cooperative;Anxious  Mood Anxious;Pleasant  Thought Process  Coherency WDL  Content WDL  Delusions None reported or observed  Perception WDL  Hallucination None reported or observed  Judgment Poor  Confusion None  Danger to Self  Current suicidal ideation? Denies  Danger to Others  Danger to Others None reported or observed   Pt seen at med window. States she is tired. Pt reports AH earlier in the day but not at time of assessment. Pt encouraged about her phone interview with the group home tomorrow.

## 2019-11-09 NOTE — BHH Counselor (Signed)
CSW attempted to reach [facility] owner, Lucy Antigua [phone]). CSW called twice yesterday and left a voicemail. Today the call went straight to voicemail. Ms.Woods was expected to make an admissions decisions over the weekend. [facility] and patient's ACT Team were updated.   [name], MSW, LCSW-A Clinical Social Worker [facility] Adult Unit

## 2019-11-09 NOTE — Progress Notes (Signed)
Psychoeducational Group Note  Date:  11/09/2019 Time:  2035  Group Topic/Focus:  Wrap-Up Group:   The focus of this group is to help patients review their daily goal of treatment and discuss progress on daily workbooks.  Participation Level: Did Not Attend  Participation Quality:  Not Applicable  Affect:  Not Applicable  Cognitive:  Not Applicable  Insight:  Not Applicable  Engagement in Group: Not Applicable  Additional Comments:  The patient did not attend group this evening.   Archie Balboa S 11/09/2019, 8:35 PM

## 2019-11-09 NOTE — BHH Counselor (Signed)
TOC Supervisor Marya Amsler spoke with [employer] owner, Prien. Stanton Kidney is agreeable to conducting a phone interview with patient tomorrow at [facility] will facilitate the screening.  [name], MSW, LCSW-A Clinical Social Worker [facility] Adult Unit

## 2019-11-09 NOTE — Progress Notes (Signed)
Recreation Therapy Notes   Date: 11/09/19 Time: 2:30-3:15 Location: 300 hall dayroom   AAA/T Program Assumption of Risk Form signed by Patient/ or Parent Legal Guardian Yes  Patient is free of allergies or sever asthma Yes  Patient reports no fear of animals Yes  Patient reports no history of cruelty to animals Yes  Patient understands his/her participation is voluntary Yes  Patient washes hands before animal contact Yes  Patient washes hands after animal contact Yes  Behavioral Response: appropriate    Education: Hand Washing, Appropriate Animal Interaction   Education Outcome: Acknowledges education.   Clinical Observations/Feedback: Patient attended session and interacted appropriately with therapy dog and peers. Patient asked appropriate questions about therapy dog and his training. Patient shared stories about their pets at home with group.   Sharonne Ricketts LRT/CTRS        Tomi Likens 11/09/2019 3:17 PM

## 2019-11-09 NOTE — Progress Notes (Addendum)
D:  Patient presented with a flat and depressed affect. Patient took all of her medicine. Patient reported feeling anxious and "agitated" before lunch. Patient was given 5 mg of Haldol. Patient requested Ativan. A:  Patient took scheduled medicine.  Support and encouragement provided Routine safety checks conducted every 15 minutes. Patient  Informed to notify staff with any concerns.  Safety maintained. R:  No adverse drug reactions noted.  Patient contracts for safety.  Patient compliant with medication and treatment plan. Patient Safety maintained.  Late note:  After lunch patient reported that she felt like she was observing herself. Patient stated that she felt "weird"

## 2019-11-09 NOTE — Progress Notes (Addendum)
[facility] MD Progress Note  11/09/2019 10:14 AM RODERICA CATHELL  MRN:  [id number] Subjective: Patient reports feeling "the same" she does endorse ongoing psychotic symptoms which she has difficulty describing, but states that she feels that there is a man whom she has not actually met who thinks about her causing her to have thoughts that are not hers.  She does state "I still feel it but I know that is not real".  She denies suicidal ideation and contracts for safety on unit although states "I want to be stuck like this forever".  Currently is future oriented and is aware that the current plan is to go to a group home, which he is in agreement with.  She is tolerating medications well but states that she feels Lorayne Bender is not completely addressing her symptoms.  Objective: Have discussed case with treatment team and I met with patient 19 year old female, presented on 4/3 for depression and psychotic symptoms which she described as being mentally tortured by a person who was transmitting thoughts to her.  She also describes some auditory hallucinations.  She has been diagnosed with schizophrenia.  Today patient presents alert, attentive, calm, polite on approach.  Her affect remains somewhat blunted/flat but denies feeling depressed and states that her mood is "okay".  As above, she continues to endorse thoughts of thought insertion, describing a man she has never met transmitting thoughts to her.  Has some difficulty describing the above but presents with improved reality testing and states "I know it is not real".  She is currently tolerating Invega well but states it is only partially addressing her symptoms. The current plan is for patient to go to a group home on discharge.  She is in agreement with this.  As per chart notes CSW/treatment team has been in contact with [facility] group home. As per staff patient has been mostly in room, limited interaction with peers, cooperative on approach.  Principal  Problem: Schizophrenia, paranoid ([place]) Diagnosis: Principal Problem:   Schizophrenia, paranoid ([place]) Active Problems:   Depressed mood   Psychosis ([place])   MDD (major depressive disorder)  Total Time spent with patient: 20 minutes  Past Psychiatric History: See admission H&P  Past Medical History:  Past Medical History:  Diagnosis Date  . Burning with urination 05/03/2015  . Contraceptive management 05/03/2015  . Depression   . Dysmenorrhea 12/30/2013  . Heroin addiction ([place])   . Menorrhagia 12/30/2013  . Menstrual extraction 12/30/2013  . Migraines   . Social anxiety disorder 09/25/2016  . Suicidal ideations   . Vaginal odor 05/03/2015    Past Surgical History:  Procedure Laterality Date  . NO PAST SURGERIES     Family History:  Family History  Problem Relation Age of Onset  . Depression Mother   . Hypertension Father   . Hyperlipidemia Father   . Cancer Paternal Grandmother        breast, uterine  . Cirrhosis Paternal Grandfather        due to alcohol   Family Psychiatric  History: See admission H&P Social History:  Social History   Substance and Sexual Activity  Alcohol Use Yes   Comment: BAC was clear     Social History   Substance and Sexual Activity  Drug Use Yes  . Types: Marijuana, Oxycodone   Comment: reports oxycodone and marijuana    Social History   Socioeconomic History  . Marital status: Single    Spouse name: Not on file  . Number  of children: Not on file  . Years of education: Not on file  . Highest education level: Not on file  Occupational History  . Occupation: Unemployed  Tobacco Use  . Smoking status: Current Every Day Smoker    Packs/day: 0.50    Types: Cigarettes  . Smokeless tobacco: Never Used  . Tobacco comment: Not ready to quit  Substance and Sexual Activity  . Alcohol use: Yes    Comment: BAC was clear  . Drug use: Yes    Types: Marijuana, Oxycodone    Comment: reports oxycodone and marijuana  . Sexual activity:  Yes    Birth control/protection: Implant  Other Topics Concern  . Not on file  Social History Narrative   06/23/2019:  Pt stated that she is homeless, that she is a high school graduate, and that she is unemployed and not followed by any outpatient provider.      Lives with Dad. Mom has LGD, lives in [place]. 11th grader. Dog.   Social Determinants of Health   Financial Resource Strain:   . Difficulty of Paying Living Expenses:   Food Insecurity:   . Worried About Charity fundraiser in the Last Year:   . Arboriculturist in the Last Year:   Transportation Needs:   . Film/video editor (Medical):   [name] Lack of Transportation (Non-Medical):   Physical Activity:   . Days of Exercise per Week:   . Minutes of Exercise per Session:   Stress:   . Feeling of Stress :   Social Connections:   . Frequency of Communication with Friends and Family:   . Frequency of Social Gatherings with Friends and Family:   . Attends Religious Services:   . Active Member of Clubs or Organizations:   . Attends Archivist Meetings:   [name] Marital Status:    Additional Social History:    Pain Medications: Please see MAR Prescriptions: Please see MAR, prozac and cogentin per pt Over the Counter: Please see MAR History of alcohol / drug use?: Yes Longest period of sobriety (when/how long): Unknown Negative Consequences of Use: Legal Name of Substance 1: alcohol 1 - Frequency: UTA 1 - Duration: UTA- pt denies 1 - Last Use / Amount: UTA  Sleep: Good  Appetite:  Good  Current Medications: Current Facility-Administered Medications  Medication Dose Route Frequency Provider Last Rate Last Admin  . acetaminophen (TYLENOL) tablet 650 mg  650 mg Oral Q6H PRN Emmaline Kluver, FNP      . alum & mag hydroxide-simeth (MAALOX/MYLANTA) 200-200-20 MG/5ML suspension 30 mL  30 mL Oral Q4H PRN Emmaline Kluver, FNP      . benztropine (COGENTIN) tablet 1 mg  1 mg Oral BID PRN Marte Celani, Myer Peer, MD   1 mg at 10/27/19  1644  . FLUoxetine (PROZAC) capsule 40 mg  40 mg Oral Daily Millette Halberstam, Myer Peer, MD   40 mg at 11/09/19 0752  . gabapentin (NEURONTIN) capsule 300 mg  300 mg Oral TID Lindell Spar I, NP   300 mg at 11/09/19 3710  . haloperidol (HALDOL) tablet 5 mg  5 mg Oral Q6H PRN Lennard Capek, Myer Peer, MD   5 mg at 11/08/19 1434   Or  . haloperidol lactate (HALDOL) injection 5 mg  5 mg Intramuscular Q6H PRN Jandiel Magallanes, Myer Peer, MD      . LORazepam (ATIVAN) tablet 1 mg  1 mg Oral Q6H PRN Lindell Spar I, NP   1 mg at  11/08/19 1349   Or  . LORazepam (ATIVAN) injection 1 mg  1 mg Intramuscular Q6H PRN Nwoko, Agnes I, NP      . magnesium hydroxide (MILK OF MAGNESIA) suspension 30 mL  30 mL Oral Daily PRN Emmaline Kluver, FNP      . nicotine polacrilex (NICORETTE) gum 2 mg  2 mg Oral PRN Maurya Nethery, Myer Peer, MD   2 mg at 11/08/19 0826  . paliperidone (INVEGA) 24 hr tablet 12 mg  12 mg Oral QHS Connye Burkitt, NP      . propranolol (INDERAL) tablet 10 mg  10 mg Oral TID Lindell Spar I, NP   10 mg at 11/09/19 0752  . traZODone (DESYREL) tablet 50 mg  50 mg Oral QHS PRN [name], NP   50 mg at 10/29/19 2109    Lab Results: No results found for this or any previous visit (from the past 48 hour(s)).  Blood Alcohol level:  Lab Results  Component Value Date   ETH <10 10/16/2019   ETH <10 21/30/8657    Metabolic Disorder Labs: Lab Results  Component Value Date   HGBA1C 5.4 10/16/2019   MPG 108.28 10/16/2019   MPG 102.54 05/17/2019   Lab Results  Component Value Date   PROLACTIN 152.0 (H) 10/16/2019   PROLACTIN 31.9 (H) 02/17/2019   Lab Results  Component Value Date   CHOL 138 10/16/2019   TRIG 60 10/16/2019   HDL 36 (L) 10/16/2019   CHOLHDL 3.8 10/16/2019   VLDL 12 10/16/2019   LDLCALC 90 10/16/2019   LDLCALC 96 05/17/2019    Physical Findings: AIMS: Facial and Oral Movements Muscles of Facial Expression: None, normal Lips and Perioral Area: None, normal Jaw: None, normal Tongue: None,  normal,Extremity Movements Upper (arms, wrists, hands, fingers): None, normal Lower (legs, knees, ankles, toes): None, normal, Trunk Movements Neck, shoulders, hips: None, normal, Overall Severity Severity of abnormal movements (highest score from questions above): None, normal Incapacitation due to abnormal movements: None, normal Patient's awareness of abnormal movements (rate only patient's report): No Awareness, Dental Status Current problems with teeth and/or dentures?: No Does patient usually wear dentures?: No  CIWA:  CIWA-Ar Total: 1 COWS:  COWS Total Score: 2  Musculoskeletal: Strength & Muscle Tone: within normal limits Gait & Station: normal Patient leans: N/A  Psychiatric Specialty Exam: Physical Exam  Nursing note and vitals reviewed. Constitutional: She is oriented to person, place, and time. She appears well-developed and well-nourished.  Cardiovascular: Normal rate.  Respiratory: Effort normal.  Neurological: She is alert and oriented to person, place, and time.    Review of Systems  Constitutional: Negative.   Respiratory: Negative for cough and shortness of breath.   Psychiatric/Behavioral: Positive for hallucinations. Negative for agitation, behavioral problems, confusion, dysphoric mood, self-injury, sleep disturbance and suicidal ideas. The patient is not nervous/anxious and is not hyperactive.   Denies headache, no visual disturbances, no chest pain, no shortness of breath, no nausea, no vomiting  Blood pressure 101/75, pulse (!) 127, temperature (!) 97.5 F (36.4 C), temperature source Oral, resp. rate 18, height '5\' 3"'$  (1.6 m), weight 67.8 kg, SpO2 98 %.Body mass index is 26.48 kg/m.  General Appearance: Casual  Eye Contact:  Good  Speech:  Slow  Volume:  Normal  Mood:  Denies feeling depressed and reports her mood as within normal  Affect:  Presents with a blunted affect, does smile briefly at times  Thought process:  Linear and Descriptions of  Associations: Intact-her  thought process presents linear/slow  Orientation:  Full (Time, Place, and Person)  Thought Content:  Continues to express delusional ideations although reality testing appears to be improving.  Currently does not endorse hallucinations and does not appear internally preoccupied at this time  Suicidal Thoughts:  No-contracts for safety on unit   Homicidal Thoughts:  No  Memory:  Recent and remote grossly intact  Judgement:  Fair  Insight:  Fair  Psychomotor Activity:  Decreased  Concentration:  Concentration: Fair and Attention Span: Fair  Recall:  [employer] of Knowledge:  Fair  Language:  Fair  Akathisia:  No  Handed:  Right  AIMS (if indicated):     Assets:  Communication Skills Leisure Time Physical Health  ADL's:  Intact  Cognition:  WNL  Sleep:  Number of Hours: 6.75   Assessment:  19 year old female, presented on 4/3 for depression and psychotic symptoms which she described as being mentally tortured by a person who was transmitting thoughts to her.  She also describes some auditory hallucinations.  She has been diagnosed with schizophrenia.  Today patient presents alert, attentive, polite on approach without psychomotor agitation.  She continues to describe psychotic symptoms (related to thought insertion/thoughts being transmitted by a man she has never actually met).  She does report "I know it is not real" and her overall reality testing appears improved.  She also presenting with negative symptoms such as blunted affect and limited socialization.  Tolerating Invega well thus far, which has been titrated up to 12 mg daily. Treatment Plan Summary: Daily contact with patient to assess and evaluate symptoms and progress in treatment and Medication management Treatment plan reviewed as below today 4/27 Encourage group and milieu participation Treatment team working on disposition planning options-as noted current plan is for patient to go to group  home. Continue Invega  12 mg PO daily for psychosis Continue Cogentin 1 mg PO BID PRN tremors/EPS Continue Prozac 40 mg PO daily for depression/anxiety Continue gabapentin 300 mg PO TID for anxiety Continue propranolol 10 mg PO TID for anxiety Continue trazodone 50 mg PO QHS PRN insomnia Continue Haldol/Ativan PO/IM PRN agitation Will recheck EKG to monitor QTC  Jenne Campus, MD 11/09/2019, 10:14 AM   Patient ID: [name], female   DOB: 2001-02-04, 19 y.o.   MRN: [id number]

## 2019-11-10 DIAGNOSIS — F200 Paranoid schizophrenia: Secondary | ICD-10-CM | POA: Diagnosis not present

## 2019-11-10 NOTE — Progress Notes (Signed)
[facility] MD Progress Note  11/10/2019 11:04 AM Julia [name]  MRN:  [id number] Subjective:  "I'm fine."  Ms. Huffstutler seen sitting in her room. She continues to present with flat affect, poverty of content. She reports good mood and good sleep overnight. She denies any SI/HI/AVH. She does not appear to be responding to internal stimuli. She has a phone interview with a group home this morning and states she feels optimistic about going to this group home. No delusional thought content expressed. She denies previously expressed concerns regarding thought insertion at this time. She remains at her baseline with disposition pending interview with group home. No agitated or disruptive behaviors on the unit.   From admission H&P: [name] with a past psychiatric history significant for schizophrenia, unspecified depression, methamphetamine use disorder in partial remission who was admitted on 10/16/2019 after she had left a group home, and developed suicidal ideation, auditory hallucinations and paranoia.  Principal Problem: Schizophrenia, paranoid ([place]) Diagnosis: Principal Problem:   Schizophrenia, paranoid ([place]) Active Problems:   Depressed mood   Psychosis ([place])   MDD (major depressive disorder)  Total Time spent with patient: 15 minutes  Past Psychiatric History: See admission H&P  Past Medical History:  Past Medical History:  Diagnosis Date  . Burning with urination 05/03/2015  . Contraceptive management 05/03/2015  . Depression   . Dysmenorrhea 12/30/2013  . Heroin addiction ([place])   . Menorrhagia 12/30/2013  . Menstrual extraction 12/30/2013  . Migraines   . Social anxiety disorder 09/25/2016  . Suicidal ideations   . Vaginal odor 05/03/2015    Past Surgical History:  Procedure Laterality Date  . NO PAST SURGERIES     Family History:  Family History  Problem Relation Age of Onset  . Depression Mother   . Hypertension Father   . Hyperlipidemia Father   . Cancer  Paternal Grandmother        breast, uterine  . Cirrhosis Paternal Grandfather        due to alcohol   Family Psychiatric  History: See admission H&P Social History:  Social History   Substance and Sexual Activity  Alcohol Use Yes   Comment: BAC was clear     Social History   Substance and Sexual Activity  Drug Use Yes  . Types: Marijuana, Oxycodone   Comment: reports oxycodone and marijuana    Social History   Socioeconomic History  . Marital status: Single    Spouse name: Not on file  . Number of children: Not on file  . Years of education: Not on file  . Highest education level: Not on file  Occupational History  . Occupation: Unemployed  Tobacco Use  . Smoking status: Current Every Day Smoker    Packs/day: 0.50    Types: Cigarettes  . Smokeless tobacco: Never Used  . Tobacco comment: Not ready to quit  Substance and Sexual Activity  . Alcohol use: Yes    Comment: BAC was clear  . Drug use: Yes    Types: Marijuana, Oxycodone    Comment: reports oxycodone and marijuana  . Sexual activity: Yes    Birth control/protection: Implant  Other Topics Concern  . Not on file  Social History Narrative   06/23/2019:  Pt stated that she is homeless, that she is a high school graduate, and that she is unemployed and not followed by any outpatient provider.      Lives with Dad. Mom has LGD, lives in [place]. 11th grader. Dog.  Social Determinants of Health   Financial Resource Strain:   . Difficulty of Paying Living Expenses:   Food Insecurity:   . Worried About Charity fundraiser in the Last Year:   . Arboriculturist in the Last Year:   Transportation Needs:   . Film/video editor (Medical):   [name] Lack of Transportation (Non-Medical):   Physical Activity:   . Days of Exercise per Week:   . Minutes of Exercise per Session:   Stress:   . Feeling of Stress :   Social Connections:   . Frequency of Communication with Friends and Family:   . Frequency of Social  Gatherings with Friends and Family:   . Attends Religious Services:   . Active Member of Clubs or Organizations:   . Attends Archivist Meetings:   [name] Marital Status:    Additional Social History:    Pain Medications: Please see MAR Prescriptions: Please see MAR, prozac and cogentin per pt Over the Counter: Please see MAR History of alcohol / drug use?: Yes Longest period of sobriety (when/how long): Unknown Negative Consequences of Use: Legal Name of Substance 1: alcohol 1 - Frequency: UTA 1 - Duration: UTA- pt denies 1 - Last Use / Amount: UTA                  Sleep: Good  Appetite:  Good  Current Medications: Current Facility-Administered Medications  Medication Dose Route Frequency Provider Last Rate Last Admin  . acetaminophen (TYLENOL) tablet 650 mg  650 mg Oral Q6H PRN Emmaline Kluver, FNP      . alum & mag hydroxide-simeth (MAALOX/MYLANTA) 200-200-20 MG/5ML suspension 30 mL  30 mL Oral Q4H PRN Emmaline Kluver, FNP      . benztropine (COGENTIN) tablet 1 mg  1 mg Oral BID PRN Cobos, Myer Peer, MD   1 mg at 10/27/19 1644  . FLUoxetine (PROZAC) capsule 40 mg  40 mg Oral Daily Cobos, Myer Peer, MD   40 mg at 11/10/19 0752  . gabapentin (NEURONTIN) capsule 300 mg  300 mg Oral TID Lindell Spar I, NP   300 mg at 11/10/19 0752  . haloperidol (HALDOL) tablet 5 mg  5 mg Oral Q6H PRN Cobos, Myer Peer, MD   5 mg at 11/09/19 1311   Or  . haloperidol lactate (HALDOL) injection 5 mg  5 mg Intramuscular Q6H PRN Cobos, Myer Peer, MD      . LORazepam (ATIVAN) tablet 1 mg  1 mg Oral Q6H PRN Lindell Spar I, NP   1 mg at 11/09/19 1507   Or  . LORazepam (ATIVAN) injection 1 mg  1 mg Intramuscular Q6H PRN Nwoko, Agnes I, NP      . magnesium hydroxide (MILK OF MAGNESIA) suspension 30 mL  30 mL Oral Daily PRN Emmaline Kluver, FNP      . nicotine polacrilex (NICORETTE) gum 2 mg  2 mg Oral PRN Cobos, Myer Peer, MD   2 mg at 11/10/19 1020  . paliperidone (INVEGA) 24 hr tablet 12 mg  12  mg Oral QHS Connye Burkitt, NP   12 mg at 11/09/19 2141  . propranolol (INDERAL) tablet 10 mg  10 mg Oral TID Lindell Spar I, NP   10 mg at 11/10/19 0752  . traZODone (DESYREL) tablet 50 mg  50 mg Oral QHS PRN [name], NP   50 mg at 10/29/19 2109    Lab Results: No results found for  this or any previous visit (from the past 48 hour(s)).  Blood Alcohol level:  Lab Results  Component Value Date   ETH <10 10/16/2019   ETH <10 123456    Metabolic Disorder Labs: Lab Results  Component Value Date   HGBA1C 5.4 10/16/2019   MPG 108.28 10/16/2019   MPG 102.54 05/17/2019   Lab Results  Component Value Date   PROLACTIN 152.0 (H) 10/16/2019   PROLACTIN 31.9 (H) 02/17/2019   Lab Results  Component Value Date   CHOL 138 10/16/2019   TRIG 60 10/16/2019   HDL 36 (L) 10/16/2019   CHOLHDL 3.8 10/16/2019   VLDL 12 10/16/2019   LDLCALC 90 10/16/2019   LDLCALC 96 05/17/2019    Physical Findings: AIMS: Facial and Oral Movements Muscles of Facial Expression: None, normal Lips and Perioral Area: None, normal Jaw: None, normal Tongue: None, normal,Extremity Movements Upper (arms, wrists, hands, fingers): None, normal Lower (legs, knees, ankles, toes): None, normal, Trunk Movements Neck, shoulders, hips: None, normal, Overall Severity Severity of abnormal movements (highest score from questions above): None, normal Incapacitation due to abnormal movements: None, normal Patient's awareness of abnormal movements (rate only patient's report): No Awareness, Dental Status Current problems with teeth and/or dentures?: No Does patient usually wear dentures?: No  CIWA:  CIWA-Ar Total: 1 COWS:  COWS Total Score: 2  Musculoskeletal: Strength & Muscle Tone: within normal limits Gait & Station: normal Patient leans: N/A  Psychiatric Specialty Exam: Physical Exam  Nursing note and vitals reviewed. Constitutional: She is oriented to person, place, and time. She appears  well-developed and well-nourished.  Respiratory: Effort normal.  Musculoskeletal:        General: Normal range of motion.  Neurological: She is alert and oriented to person, place, and time.    Review of Systems  Constitutional: Negative.   Respiratory: Negative for cough and shortness of breath.   Psychiatric/Behavioral: Negative for agitation, behavioral problems, confusion, dysphoric mood, hallucinations, self-injury, sleep disturbance and suicidal ideas. The patient is not nervous/anxious and is not hyperactive.     Blood pressure 108/68, pulse (!) 133, temperature (!) 97.5 F (36.4 C), temperature source Oral, resp. rate 18, height 5\' 3"  (1.6 m), weight 67.8 kg, SpO2 98 %.Body mass index is 26.48 kg/m.  General Appearance: Casual  Eye Contact:  Good  Speech:  Slow  Volume:  Normal  Mood:  Euthymic  Affect:  Flat  Thought Process:  Coherent  Orientation:  Full (Time, Place, and Person)  Thought Content:  Logical  Suicidal Thoughts:  No  Homicidal Thoughts:  No  Memory:  Immediate;   Fair Recent;   Fair  Judgement:  Fair  Insight:  Lacking  Psychomotor Activity:  Normal  Concentration:  Concentration: Fair and Attention Span: Fair  Recall:  [employer] of Knowledge:  Fair  Language:  Fair  Akathisia:  No  Handed:  Right  AIMS (if indicated):     Assets:  Communication Skills Desire for Improvement Leisure Time Physical Health  ADL's:  Intact  Cognition:  WNL  Sleep:  Number of Hours: 6.75     Treatment Plan Summary: Daily contact with patient to assess and evaluate symptoms and progress in treatment and Medication management   Continue inpatient hospitalization.  Continue Invega 12 mg PO QHS for psychosis Continue Cogentin 1 mg PO BID PRN tremors/EPS Continue Prozac 40 mg PO daily for depression/anxiety Continue gabapentin 300 mg PO TID for anxiety Continue propranolol 10 mg PO TID for anxiety Continue trazodone  50 mg PO QHS PRN insomnia Continue  Haldol/Ativan PO/IM PRN agitation  Patient will participate in the therapeutic group milieu.  Discharge disposition in progress.   Connye Burkitt, NP 11/10/2019, 11:04 AM

## 2019-11-10 NOTE — Tx Team (Cosign Needed)
Interdisciplinary Treatment and Diagnostic Plan Update  11/10/2019 Time of Session: 8:45am  Julia [name] MRN: [id number]  Principal Diagnosis: Schizophrenia, paranoid [facility])  Secondary Diagnoses: Principal Problem:   Schizophrenia, paranoid ([place]) Active Problems:   Depressed mood   Psychosis ([place])   MDD (major depressive disorder)   Current Medications:  Current Facility-Administered Medications  Medication Dose Route Frequency Provider Last Rate Last Admin  . acetaminophen (TYLENOL) tablet 650 mg  650 mg Oral Q6H PRN Emmaline Kluver, FNP      . alum & mag hydroxide-simeth (MAALOX/MYLANTA) 200-200-20 MG/5ML suspension 30 mL  30 mL Oral Q4H PRN Emmaline Kluver, FNP      . benztropine (COGENTIN) tablet 1 mg  1 mg Oral BID PRN Cobos, Myer Peer, MD   1 mg at 10/27/19 1644  . FLUoxetine (PROZAC) capsule 40 mg  40 mg Oral Daily Cobos, Myer Peer, MD   40 mg at 11/10/19 0752  . gabapentin (NEURONTIN) capsule 300 mg  300 mg Oral TID Lindell Spar I, NP   300 mg at 11/10/19 0752  . haloperidol (HALDOL) tablet 5 mg  5 mg Oral Q6H PRN Cobos, Myer Peer, MD   5 mg at 11/09/19 1311   Or  . haloperidol lactate (HALDOL) injection 5 mg  5 mg Intramuscular Q6H PRN Cobos, Myer Peer, MD      . LORazepam (ATIVAN) tablet 1 mg  1 mg Oral Q6H PRN Lindell Spar I, NP   1 mg at 11/09/19 1507   Or  . LORazepam (ATIVAN) injection 1 mg  1 mg Intramuscular Q6H PRN Nwoko, Agnes I, NP      . magnesium hydroxide (MILK OF MAGNESIA) suspension 30 mL  30 mL Oral Daily PRN Emmaline Kluver, FNP      . nicotine polacrilex (NICORETTE) gum 2 mg  2 mg Oral PRN Cobos, Myer Peer, MD   2 mg at 11/09/19 1031  . paliperidone (INVEGA) 24 hr tablet 12 mg  12 mg Oral QHS Connye Burkitt, NP   12 mg at 11/09/19 2141  . propranolol (INDERAL) tablet 10 mg  10 mg Oral TID Lindell Spar I, NP   10 mg at 11/10/19 0752  . traZODone (DESYREL) tablet 50 mg  50 mg Oral QHS PRN [name], NP   50 mg at 10/29/19 2109   PTA  Medications: Medications Prior to Admission  Medication Sig Dispense Refill Last Dose  . ibuprofen (ADVIL) 400 MG tablet Take 400 mg by mouth every 6 (six) hours as needed for headache or mild pain.     . benztropine (COGENTIN) 1 MG tablet Take 1 tablet (1 mg total) by mouth 2 (two) times daily. (Patient not taking: Reported on 10/16/2019) 60 tablet 1 Not Taking at Unknown time  . FLUoxetine (PROZAC) 20 MG capsule Take 1 capsule (20 mg total) by mouth daily. (Patient not taking: Reported on 10/16/2019) 30 capsule 1 Not Taking at Unknown time  . gabapentin (NEURONTIN) 400 MG capsule Take 1 capsule (400 mg total) by mouth 3 (three) times daily. (Patient not taking: Reported on 10/16/2019) 90 capsule 1 Not Taking at Unknown time  . haloperidol decanoate (HALDOL DECANOATE) 100 MG/ML injection Inject 0.5 mLs (50 mg total) into the muscle every 30 (thirty) days. NEXT DOSE DUE AROUND September 15, 2019 (Patient not taking: Reported on 10/16/2019) 1 mL 1 Not Taking at Unknown time  . paliperidone (INVEGA SUSTENNA) 156 MG/ML SUSY injection Inject 1 mL (156 mg total) into the  muscle every 30 (thirty) days. NEXT DOSE DUE AROUND September 15, 2019 (Patient not taking: Reported on 10/16/2019) 1.2 mL 1 Not Taking at Unknown time  . paliperidone (INVEGA) 3 MG 24 hr tablet Take 1 tablet (3 mg total) by mouth daily. (Patient not taking: Reported on 10/16/2019) 30 tablet 1 Not Taking at Unknown time  . traZODone (DESYREL) 50 MG tablet Take 1 tablet (50 mg total) by mouth at bedtime. (Patient not taking: Reported on 10/16/2019) 30 tablet 1 Not Taking at Unknown time    Patient Stressors:    Patient Strengths:    Treatment Modalities: Medication Management, Group therapy, Case management,  1 to 1 session with clinician, Psychoeducation, Recreational therapy.   Physician Treatment Plan for Primary Diagnosis: Schizophrenia, paranoid ([place]) Long Term Goal(s): Improvement in symptoms so as ready for discharge Improvement in symptoms so as  ready for discharge   Short Term Goals: Ability to identify changes in lifestyle to reduce recurrence of condition will improve Ability to verbalize feelings will improve Ability to disclose and discuss suicidal ideas Ability to demonstrate self-control will improve Ability to identify and develop effective coping behaviors will improve Ability to maintain clinical measurements within normal limits will improve Compliance with prescribed medications will improve Ability to identify changes in lifestyle to reduce recurrence of condition will improve Ability to verbalize feelings will improve Ability to disclose and discuss suicidal ideas Ability to demonstrate self-control will improve Ability to identify and develop effective coping behaviors will improve Ability to maintain clinical measurements within normal limits will improve Compliance with prescribed medications will improve  Medication Management: Evaluate patient's response, side effects, and tolerance of medication regimen.  Therapeutic Interventions: 1 to 1 sessions, Unit Group sessions and Medication administration.  Evaluation of Outcomes: Progressing  Physician Treatment Plan for Secondary Diagnosis: Principal Problem:   Schizophrenia, paranoid ([place]) Active Problems:   Depressed mood   Psychosis ([place])   MDD (major depressive disorder)  Long Term Goal(s): Improvement in symptoms so as ready for discharge Improvement in symptoms so as ready for discharge   Short Term Goals: Ability to identify changes in lifestyle to reduce recurrence of condition will improve Ability to verbalize feelings will improve Ability to disclose and discuss suicidal ideas Ability to demonstrate self-control will improve Ability to identify and develop effective coping behaviors will improve Ability to maintain clinical measurements within normal limits will improve Compliance with prescribed medications will improve Ability to identify  changes in lifestyle to reduce recurrence of condition will improve Ability to verbalize feelings will improve Ability to disclose and discuss suicidal ideas Ability to demonstrate self-control will improve Ability to identify and develop effective coping behaviors will improve Ability to maintain clinical measurements within normal limits will improve Compliance with prescribed medications will improve     Medication Management: Evaluate patient's response, side effects, and tolerance of medication regimen.  Therapeutic Interventions: 1 to 1 sessions, Unit Group sessions and Medication administration.  Evaluation of Outcomes: Progressing   RN Treatment Plan for Primary Diagnosis: Schizophrenia, paranoid ([place]) Long Term Goal(s): Knowledge of disease and therapeutic regimen to maintain health will improve  Short Term Goals: Ability to participate in decision making will improve, Ability to verbalize feelings will improve, Ability to disclose and discuss suicidal ideas, Ability to identify and develop effective coping behaviors will improve and Compliance with prescribed medications will improve  Medication Management: RN will administer medications as ordered by provider, will assess and evaluate patient's response and provide education to patient for prescribed  medication. RN will report any adverse and/or side effects to prescribing provider.  Therapeutic Interventions: 1 on 1 counseling sessions, Psychoeducation, Medication administration, Evaluate responses to treatment, Monitor vital signs and CBGs as ordered, Perform/monitor CIWA, COWS, AIMS and Fall Risk screenings as ordered, Perform wound care treatments as ordered.  Evaluation of Outcomes: Progressing   LCSW Treatment Plan for Primary Diagnosis: Schizophrenia, paranoid ([place]) Long Term Goal(s): Safe transition to appropriate next level of care at discharge, Engage patient in therapeutic group addressing interpersonal  concerns.  Short Term Goals: Engage patient in aftercare planning with referrals and resources and Increase skills for wellness and recovery  Therapeutic Interventions: Assess for all discharge needs, 1 to 1 time with Social worker, Explore available resources and support systems, Assess for adequacy in community support network, Educate family and significant other(s) on suicide prevention, Complete Psychosocial Assessment, Interpersonal group therapy.  Evaluation of Outcomes: Progressing   Progress in Treatment: Attending groups: Yes. Participating in groups: Yes. Taking medication as prescribed: Yes. Toleration medication: Yes. Family/Significant other contact made: Yes, individual(s) contacted:  with pt's father  Patient understands diagnosis: Yes. Discussing patient identified problems/goals with staff: Yes. Medical problems stabilized or resolved: Yes. Denies suicidal/homicidal ideation: Yes. Issues/concerns per patient self-inventory: No. Other:   New problem(s) identified: Yes, Describe:  group home placement   New Short Term/Long Term Goal(s): Medication stabilization, elimination of SI thoughts, and development of a comprehensive mental wellness plan.   Patient Goals:    Discharge Plan or Barriers: Patient is being reviewed for possible group home placement. CSW will continue to follow up for appropriate referrals and possible discharge planning  Reason for Continuation of Hospitalization: Anxiety Delusions  Medication stabilization  Estimated Length of Stay: TBD  Attendees: Patient:  10/22/2019   Physician: Dr. Jake Samples, MD; Dr. Neita Garnet, MD; Dr. Myles Lipps, MD 10/22/2019   Nursing: Gilberto Better., RN 10/22/2019   RN Care Manager: 10/22/2019   Social Worker: Lurline Idol, LCSW; Radonna Ricker, LCSW 10/22/2019   Recreational Therapist:  10/22/2019   Other: Ovidio Kin, MSW intern  10/22/2019   Other:  10/22/2019   Other: 10/22/2019      Scribe for Treatment Team: Marylee Floras, [place] 11/10/2019 9:57 AM

## 2019-11-10 NOTE — Progress Notes (Signed)
D:  Patient denied SI and HI, contracts for safety.  Denied A/V hallucinations.  Denied pain. A:  Medications administered per MD orders.  Emotional support and encouragement given patient. R:  Safety maintained with 15 minute checks. Patient took shower in tub room this morning.  Stated she is feeling better this morning.

## 2019-11-10 NOTE — Progress Notes (Signed)
Recreation Therapy Notes  Date:  4.28.21 Time: 0930 Location: [address] Dayroom  Group Topic: Stress Management  Goal Area(s) Addresses:  Patient will identify positive stress management techniques. Patient will identify benefits of using stress management post d/c.  Intervention:  Stress Management  Activity:  Guided Imagery.  LRT read a script that guided patients to envision their peaceful place and all the things that make it what it is.  Patients were to listen and follow along as script was read to fully engage in activity.   Education:  Stress Management, Discharge Planning.   Education Outcome: Acknowledges Education  Clinical Observations/Feedback: Pt did not attend activity.     [name], LRT/CTRS         [name] A 11/10/2019 11:16 AM
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
## 2019-11-10 NOTE — Plan of Care (Signed)
Nurse discussed anxiety, depression and coping skills with patient.  

## 2019-11-10 NOTE — BHH Counselor (Signed)
CSW facilitated group home interview with patient and Ms.[name] Hammers (phone: [phone]), director of [employer] in [place].   If accepted, Shanda would have her own room in a group home with three other residents in their 20's and 30's.   Ms.Woods was faxed another copy of patient's FL-2 (fax: [phone]). Ms.Woods reports that she will complete a final review of patient with her QP and will make an admissions decision on Friday, 04/30. TOC Supervisor, Marya Amsler and patient's ACT Team were updated.  TOC Director, [name] has agreed to an LOG if placement can be found for patient.  [name], MSW, LCSW-A Clinical Social Worker [facility]

## 2019-11-10 NOTE — BHH Group Notes (Addendum)
LCSW Group Therapy Note 11/10/2019 11:37 AM  Type of Therapy/Topic: Group Therapy: Emotion Regulation  Participation Level: Active   Description of Group:  The purpose of this group is to assist patients in learning to regulate negative emotions and experience positive emotions. Patients will be guided to discuss ways in which they have been vulnerable to their negative emotions. These vulnerabilities will be juxtaposed with experiences of positive emotions or situations, and patients will be challenged to use positive emotions to combat negative ones. Special emphasis will be placed on coping with negative emotions in conflict situations, and patients will process healthy conflict resolution skills.   Therapeutic Goals: 1. Patient will identify two positive emotions or experiences to reflect on in order to balance out negative emotions 2. Patient will label two or more emotions that they find the most difficult to experience 3. Patient will demonstrate positive conflict resolution skills through discussion and/or role plays  Summary of Patient Progress: Julia [name] was engaged and participated throughout the group session. Julia [name] reports she struggles regulating her emotions frequently. She reports that anger is the main emotions she struggles with regulating. Julia [name] states she become angry when she feels others are judging her. Julia [name] shared that since being in the hospital she has noticed that she has improved with regulating her anger with the help of medications.   CSW provided a worksheet packet that provides comprehensive psycho-educational skills that challenges patients to confront negative thoughts and emotions by providing discussion points and self reflective questions.CSW reviewed and processed the workbook with the group and allotted opportunities for patients to express their answers and thoughts after completing the packet.     Therapeutic Modalities:  Cognitive Behavioral  Therapy Feelings Identification Dialectical Behavioral Therapy   Theresa Duty Clinical Social Worker

## 2019-11-11 LAB — RESPIRATORY PANEL BY RT PCR (FLU A&B, COVID)
Influenza A by PCR: NEGATIVE
Influenza B by PCR: NEGATIVE
SARS Coronavirus 2 by RT PCR: NEGATIVE

## 2019-11-11 NOTE — Progress Notes (Signed)
Pt sleep this shift. Did not come out of her room for medications or groups. Pt safely maintained on the unit.

## 2019-11-11 NOTE — Progress Notes (Signed)
Pt did not attend the evening wrap up group. Pt remained in bed during group time.

## 2019-11-11 NOTE — Progress Notes (Signed)
Pt has been isolative in her room most of the evening. Pt present with depressed mood , not interactive and minimizes. Pt evening meds given as ordrerd and handled well. Covid- 19 swab completed and sent to the lab as ordered, will continue to monitor.

## 2019-11-11 NOTE — Progress Notes (Signed)
   11/11/19 1120  Vital Signs  Temp 98.1 F (36.7 C)  Temp Source Oral  Pulse Rate 88  BP 95/64  BP Location Right Arm  BP Method Automatic  Patient Position (if appropriate) Sitting  D: Pt. Presented with a flat affect. Patient reported anxiety. Haldol 5 mg and Ativan  1 mg p.o. given. Patient isolated in room.  A:  Patient took scheduled medicine.  Support and encouragement provided Routine safety checks conducted every 15 minutes. Patient  Informed to notify staff with any concerns.  Safety maintained. R:  No adverse drug reactions noted.  Patient contracts for safety.  Patient compliant with medication and treatment plan. Patient cooperative and calm. Patient interacts well with others on the unit.  Safety maintained.

## 2019-11-11 NOTE — Progress Notes (Signed)
[facility] MD Progress Note  11/11/2019 11:07 AM [name]  MRN:  [id number] Subjective: Patient reports she is feeling okay.  Currently denies medication side effects, although states she has felt some stiffness.  Of note, today states she has not any thoughts about a man putting thoughts in her head.  Denies suicidal ideations.  Reports she had an interview with group home yesterday.  She feels it went well.  Objective: I have met with patient and reviewed case with treatment team. 19 year old female, presented on 4/3 for depression and psychotic symptoms which she described as being mentally tortured by a person who was transmitting thoughts to her.  She also describes some auditory hallucinations.  She has been diagnosed with schizophrenia.  Patient presents alert, attentive, calm, without psychomotor agitation or restlessness. Visible in dayroom, less isolative, limited interaction with peers. As above, reported feeling "stiff" earlier.  No rigidity is noted.  With female PA student present I examined patient and noted no cogwheeling or stiffness.  Her gait is steady.  Vitals are stable and her temperature is 98.1 She continues to present with a rather blunted/flat affect but denies feeling depressed and describes her mood as "okay".  She also presents future oriented and states that she would like to go to the group home she interviewed at yesterday. Of note, today reports she has not had thoughts of thought insertion which have been persistent and distressing for patient.  She does not appear internally preoccupied. She is currently on Invega-noted to have elevated prolactin on 4/3.  Patient denies galactorrhea or discomfort.  Does endorse a menorrhea but attributes to contraceptive medication.  Principal Problem: Schizophrenia, paranoid ([place]) Diagnosis: Principal Problem:   Schizophrenia, paranoid ([place]) Active Problems:   Depressed mood   Psychosis ([place])   MDD (major depressive  disorder)  Total Time spent with patient: 15 minutes  Past Psychiatric History: See admission H&P  Past Medical History:  Past Medical History:  Diagnosis Date  . Burning with urination 05/03/2015  . Contraceptive management 05/03/2015  . Depression   . Dysmenorrhea 12/30/2013  . Heroin addiction ([place])   . Menorrhagia 12/30/2013  . Menstrual extraction 12/30/2013  . Migraines   . Social anxiety disorder 09/25/2016  . Suicidal ideations   . Vaginal odor 05/03/2015    Past Surgical History:  Procedure Laterality Date  . NO PAST SURGERIES     Family History:  Family History  Problem Relation Age of Onset  . Depression Mother   . Hypertension Father   . Hyperlipidemia Father   . Cancer Paternal Grandmother        breast, uterine  . Cirrhosis Paternal Grandfather        due to alcohol   Family Psychiatric  History: See admission H&P Social History:  Social History   Substance and Sexual Activity  Alcohol Use Yes   Comment: BAC was clear     Social History   Substance and Sexual Activity  Drug Use Yes  . Types: Marijuana, Oxycodone   Comment: reports oxycodone and marijuana    Social History   Socioeconomic History  . Marital status: Single    Spouse name: Not on file  . Number of children: Not on file  . Years of education: Not on file  . Highest education level: Not on file  Occupational History  . Occupation: Unemployed  Tobacco Use  . Smoking status: Current Every Day Smoker    Packs/day: 0.50    Types: Cigarettes  .  Smokeless tobacco: Never Used  . Tobacco comment: Not ready to quit  Substance and Sexual Activity  . Alcohol use: Yes    Comment: BAC was clear  . Drug use: Yes    Types: Marijuana, Oxycodone    Comment: reports oxycodone and marijuana  . Sexual activity: Yes    Birth control/protection: Implant  Other Topics Concern  . Not on file  Social History Narrative   06/23/2019:  Pt stated that she is homeless, that she is a high school  graduate, and that she is unemployed and not followed by any outpatient provider.      Lives with Dad. Mom has LGD, lives in [place]. 11th grader. Dog.   Social Determinants of Health   Financial Resource Strain:   . Difficulty of Paying Living Expenses:   Food Insecurity:   . Worried About Charity fundraiser in the Last Year:   . Arboriculturist in the Last Year:   Transportation Needs:   . Film/video editor (Medical):   [name] Lack of Transportation (Non-Medical):   Physical Activity:   . Days of Exercise per Week:   . Minutes of Exercise per Session:   Stress:   . Feeling of Stress :   Social Connections:   . Frequency of Communication with Friends and Family:   . Frequency of Social Gatherings with Friends and Family:   . Attends Religious Services:   . Active Member of Clubs or Organizations:   . Attends Archivist Meetings:   [name] Marital Status:    Additional Social History:    Pain Medications: Please see MAR Prescriptions: Please see MAR, prozac and cogentin per pt Over the Counter: Please see MAR History of alcohol / drug use?: Yes Longest period of sobriety (when/how long): Unknown Negative Consequences of Use: Legal Name of Substance 1: alcohol 1 - Frequency: UTA 1 - Duration: UTA- pt denies 1 - Last Use / Amount: UTA   Sleep: Good  Appetite:  Good  Current Medications: Current Facility-Administered Medications  Medication Dose Route Frequency Provider Last Rate Last Admin  . acetaminophen (TYLENOL) tablet 650 mg  650 mg Oral Q6H PRN Emmaline Kluver, FNP      . alum & mag hydroxide-simeth (MAALOX/MYLANTA) 200-200-20 MG/5ML suspension 30 mL  30 mL Oral Q4H PRN Emmaline Kluver, FNP      . benztropine (COGENTIN) tablet 1 mg  1 mg Oral BID PRN Ilisa Hayworth, Myer Peer, MD   1 mg at 10/27/19 1644  . FLUoxetine (PROZAC) capsule 40 mg  40 mg Oral Daily Mindee Robledo, Myer Peer, MD   40 mg at 11/11/19 0805  . gabapentin (NEURONTIN) capsule 300 mg  300 mg Oral TID Lindell Spar I,  NP   300 mg at 11/11/19 0804  . haloperidol (HALDOL) tablet 5 mg  5 mg Oral Q6H PRN Sharea Guinther, Myer Peer, MD   5 mg at 11/11/19 8657   Or  . haloperidol lactate (HALDOL) injection 5 mg  5 mg Intramuscular Q6H PRN Camillia Marcy, Myer Peer, MD      . LORazepam (ATIVAN) tablet 1 mg  1 mg Oral Q6H PRN Lindell Spar I, NP   1 mg at 11/11/19 8469   Or  . LORazepam (ATIVAN) injection 1 mg  1 mg Intramuscular Q6H PRN Nwoko, Agnes I, NP      . magnesium hydroxide (MILK OF MAGNESIA) suspension 30 mL  30 mL Oral Daily PRN Emmaline Kluver, FNP      .  nicotine polacrilex (NICORETTE) gum 2 mg  2 mg Oral PRN Peirce Deveney, Myer Peer, MD   2 mg at 11/11/19 1023  . paliperidone (INVEGA) 24 hr tablet 12 mg  12 mg Oral QHS Connye Burkitt, NP   12 mg at 11/09/19 2141  . propranolol (INDERAL) tablet 10 mg  10 mg Oral TID Lindell Spar I, NP   10 mg at 11/11/19 0805  . traZODone (DESYREL) tablet 50 mg  50 mg Oral QHS PRN [name], NP   50 mg at 10/29/19 2109    Lab Results: No results found for this or any previous visit (from the past 48 hour(s)).  Blood Alcohol level:  Lab Results  Component Value Date   ETH <10 10/16/2019   ETH <10 40/04/2724    Metabolic Disorder Labs: Lab Results  Component Value Date   HGBA1C 5.4 10/16/2019   MPG 108.28 10/16/2019   MPG 102.54 05/17/2019   Lab Results  Component Value Date   PROLACTIN 152.0 (H) 10/16/2019   PROLACTIN 31.9 (H) 02/17/2019   Lab Results  Component Value Date   CHOL 138 10/16/2019   TRIG 60 10/16/2019   HDL 36 (L) 10/16/2019   CHOLHDL 3.8 10/16/2019   VLDL 12 10/16/2019   LDLCALC 90 10/16/2019   LDLCALC 96 05/17/2019    Physical Findings: AIMS: Facial and Oral Movements Muscles of Facial Expression: None, normal Lips and Perioral Area: None, normal Jaw: None, normal Tongue: None, normal,Extremity Movements Upper (arms, wrists, hands, fingers): None, normal Lower (legs, knees, ankles, toes): None, normal, Trunk Movements Neck, shoulders, hips:  None, normal, Overall Severity Severity of abnormal movements (highest score from questions above): None, normal Incapacitation due to abnormal movements: None, normal Patient's awareness of abnormal movements (rate only patient's report): No Awareness, Dental Status Current problems with teeth and/or dentures?: No Does patient usually wear dentures?: No  CIWA:  CIWA-Ar Total: 1 COWS:  COWS Total Score: 2  Musculoskeletal: Strength & Muscle Tone: within normal limits Gait & Station: normal Patient leans: N/A  Psychiatric Specialty Exam: Physical Exam  Nursing note and vitals reviewed. Constitutional: She is oriented to person, place, and time. She appears well-developed and well-nourished.  Respiratory: Effort normal.  Musculoskeletal:        General: Normal range of motion.  Neurological: She is alert and oriented to person, place, and time.    Review of Systems  Constitutional: Negative.   Respiratory: Negative for cough and shortness of breath.   Psychiatric/Behavioral: Negative for agitation, behavioral problems, confusion, dysphoric mood, hallucinations, self-injury, sleep disturbance and suicidal ideas. The patient is not nervous/anxious and is not hyperactive.   No chest pain, no shortness of breath, no vomiting, no rash  Blood pressure 113/72, pulse 87, temperature (!) 97.5 F (36.4 C), temperature source Oral, resp. rate 18, height '5\' 3"'$  (1.6 m), weight 67.8 kg, SpO2 98 %.Body mass index is 26.48 kg/m.  General Appearance: Casual  Eye Contact:  Good  Speech:  Slow  Volume:  Normal  Mood:  Reports mood as "okay" and denies depression at this time  Affect:  Remains blunted in affect  Thought Process:  Linear and Descriptions of Associations: Circumstantial  Orientation:  Other:  Fully alert and attentive  Thought Content:  Denies hallucinations and today denies delusional ideations of thought insertion-see prior notes  Suicidal Thoughts:  No she denies any suicidal  ideations and contracts for safety on unit  Homicidal Thoughts:  No  Memory:  Recent and remote grossly  intact  Judgement:  Fair  Insight:  Fair  Psychomotor Activity:  Normalmore visible on unit, less isolative  Concentration:  Concentration: Fair and Attention Span: Fair  Recall:  Good  Fund of Knowledge:  Good  Language:  Good  Akathisia:  No  Handed:  Right  AIMS (if indicated):     Assets:  Communication Skills Desire for Improvement Leisure Time Physical Health  ADL's:  Intact  Cognition:  WNL  Sleep:  Number of Hours: 6.75   Assessment:  19 year old female, presented on 4/3 for depression and psychotic symptoms which she described as being mentally tortured by a person who was transmitting thoughts to her.  She also describes some auditory hallucinations.  She has been diagnosed with schizophrenia.  Today patient presents alert, attentive, reports she is feeling better and today denies psychotic/delusional ideations no thought insertion which has been chronic and subjectively distressing.  Denies depression but blunted affect persists, felt to be related to negative symptomatology . She is tolerating medications well, does describe feeling "stiff" earlier today but no rigidity/EPS or cogwheeling noted.  Also noted elevated prolactin on 4/3 , most likely related to chronic antipsychotic management.  She denies galactorrhea or discomfort, does endorse amenorrhea.  Current plan is for group home placement.  She had an interview with group home staff yesterday.  CSW awaiting for response from group home   Treatment Plan Summary: Daily contact with patient to assess and evaluate symptoms and progress in treatment and Medication management  Treatment plan reviewed as below today 4/29 Encourage group and milieu participation Treatment team working on disposition planning as above Continue Invega 12 mg PO QHS for psychosis Continue Cogentin 1 mg PO BID PRN tremors/EPS Continue  Prozac 40 mg PO daily for depression/anxiety Continue gabapentin 300 mg PO TID for anxiety Continue propranolol 10 mg PO TID for anxiety Continue trazodone 50 mg PO QHS PRN insomnia Continue Haldol/Ativan PO/IM PRN agitation Recheck prolactin serum level    Jenne Campus, MD 11/11/2019, 11:07 AM   Patient ID: [name], female   DOB: [birth year], 19 y.o.   MRN: [id number]

## 2019-11-12 LAB — PROLACTIN: Prolactin: 113 ng/mL — ABNORMAL HIGH (ref 4.8–23.3)

## 2019-11-12 NOTE — Progress Notes (Signed)
[facility] MD Progress Note  11/12/2019 2:51 PM Julia [name]  MRN:  [id number] Subjective:  "I'm ok."  Julia [name] seen sitting in her room. She continues to present with flat affect, minimal speech and reports mood is "fine." Denies depressed mood. She has been visible in the dayroom at times. She denies AVH and shows no signs of responding to internal stimuli. No delusional thought content expressed. She specifically denies concerns about previously reported thought insertion. She denies SI/HI. She interviewed with a group home on Wednesday and still awaiting a decision from the group home. She states desire to discharge to this group home. No agitated or disruptive behaviors on the unit. Serum prolactin level 113.0, decreased from 152.0 on 10/16/19. Patient denies galactorrhea.   From admission H&P:[name] with a past psychiatric history significant for schizophrenia, unspecified depression, methamphetamine use disorder in partial remission who was admitted on 10/16/2019 after she had left a group home, and developed suicidal ideation, auditory hallucinations and paranoia.  Principal Problem: Schizophrenia, paranoid ([place]) Diagnosis: Principal Problem:   Schizophrenia, paranoid ([place]) Active Problems:   Depressed mood   Psychosis ([place])   MDD (major depressive disorder)  Total Time spent with patient: 15 minutes  Past Psychiatric History: See admission H&P  Past Medical History:  Past Medical History:  Diagnosis Date  . Burning with urination 05/03/2015  . Contraceptive management 05/03/2015  . Depression   . Dysmenorrhea 12/30/2013  . Heroin addiction ([place])   . Menorrhagia 12/30/2013  . Menstrual extraction 12/30/2013  . Migraines   . Social anxiety disorder 09/25/2016  . Suicidal ideations   . Vaginal odor 05/03/2015    Past Surgical History:  Procedure Laterality Date  . NO PAST SURGERIES     Family History:  Family History  Problem Relation Age of Onset  .  Depression Mother   . Hypertension Father   . Hyperlipidemia Father   . Cancer Paternal Grandmother        breast, uterine  . Cirrhosis Paternal Grandfather        due to alcohol   Family Psychiatric  History: See admission H&P Social History:  Social History   Substance and Sexual Activity  Alcohol Use Yes   Comment: BAC was clear     Social History   Substance and Sexual Activity  Drug Use Yes  . Types: Marijuana, Oxycodone   Comment: reports oxycodone and marijuana    Social History   Socioeconomic History  . Marital status: Single    Spouse name: Not on file  . Number of children: Not on file  . Years of education: Not on file  . Highest education level: Not on file  Occupational History  . Occupation: Unemployed  Tobacco Use  . Smoking status: Current Every Day Smoker    Packs/day: 0.50    Types: Cigarettes  . Smokeless tobacco: Never Used  . Tobacco comment: Not ready to quit  Substance and Sexual Activity  . Alcohol use: Yes    Comment: BAC was clear  . Drug use: Yes    Types: Marijuana, Oxycodone    Comment: reports oxycodone and marijuana  . Sexual activity: Yes    Birth control/protection: Implant  Other Topics Concern  . Not on file  Social History Narrative   06/23/2019:  Pt stated that she is homeless, that she is a high school graduate, and that she is unemployed and not followed by any outpatient provider.      Lives with  Dad. Mom has LGD, lives in [place]. 11th grader. Dog.   Social Determinants of Health   Financial Resource Strain:   . Difficulty of Paying Living Expenses:   Food Insecurity:   . Worried About Charity fundraiser in the Last Year:   . Arboriculturist in the Last Year:   Transportation Needs:   . Film/video editor (Medical):   [name] Lack of Transportation (Non-Medical):   Physical Activity:   . Days of Exercise per Week:   . Minutes of Exercise per Session:   Stress:   . Feeling of Stress :   Social Connections:   .  Frequency of Communication with Friends and Family:   . Frequency of Social Gatherings with Friends and Family:   . Attends Religious Services:   . Active Member of Clubs or Organizations:   . Attends Archivist Meetings:   [name] Marital Status:    Additional Social History:    Pain Medications: Please see MAR Prescriptions: Please see MAR, prozac and cogentin per pt Over the Counter: Please see MAR History of alcohol / drug use?: Yes Longest period of sobriety (when/how long): Unknown Negative Consequences of Use: Legal Name of Substance 1: alcohol 1 - Frequency: UTA 1 - Duration: UTA- pt denies 1 - Last Use / Amount: UTA                  Sleep: Good  Appetite:  Good  Current Medications: Current Facility-Administered Medications  Medication Dose Route Frequency Provider Last Rate Last Admin  . acetaminophen (TYLENOL) tablet 650 mg  650 mg Oral Q6H PRN Emmaline Kluver, FNP      . alum & mag hydroxide-simeth (MAALOX/MYLANTA) 200-200-20 MG/5ML suspension 30 mL  30 mL Oral Q4H PRN Emmaline Kluver, FNP      . benztropine (COGENTIN) tablet 1 mg  1 mg Oral BID PRN Cobos, Myer Peer, MD   1 mg at 10/27/19 1644  . FLUoxetine (PROZAC) capsule 40 mg  40 mg Oral Daily Cobos, Myer Peer, MD   40 mg at 11/12/19 [id number]  . gabapentin (NEURONTIN) capsule 300 mg  300 mg Oral TID Lindell Spar I, NP   300 mg at 11/12/19 1259  . haloperidol (HALDOL) tablet 5 mg  5 mg Oral Q6H PRN Cobos, Myer Peer, MD   5 mg at 11/11/19 [id number]   Or  . haloperidol lactate (HALDOL) injection 5 mg  5 mg Intramuscular Q6H PRN Cobos, Myer Peer, MD      . LORazepam (ATIVAN) tablet 1 mg  1 mg Oral Q6H PRN Lindell Spar I, NP   1 mg at 11/12/19 1427   Or  . LORazepam (ATIVAN) injection 1 mg  1 mg Intramuscular Q6H PRN Nwoko, Agnes I, NP      . magnesium hydroxide (MILK OF MAGNESIA) suspension 30 mL  30 mL Oral Daily PRN Emmaline Kluver, FNP      . nicotine polacrilex (NICORETTE) gum 2 mg  2 mg Oral PRN Cobos, Myer Peer, MD   2 mg at 11/12/19 1000  . paliperidone (INVEGA) 24 hr tablet 12 mg  12 mg Oral QHS Connye Burkitt, NP   12 mg at 11/11/19 2127  . propranolol (INDERAL) tablet 10 mg  10 mg Oral TID Lindell Spar I, NP   10 mg at 11/12/19 1300  . traZODone (DESYREL) tablet 50 mg  50 mg Oral QHS PRN Dixon, Ernst Bowler, NP   50 mg  at 10/29/19 2109    Lab Results:  Results for orders placed or performed during the hospital encounter of 10/15/19 (from the past 48 hour(s))  Prolactin     Status: Abnormal   Collection Time: 11/11/19  6:22 PM  Result Value Ref Range   Prolactin 113.0 (H) 4.8 - 23.3 ng/mL    Comment: (NOTE) Performed At: [facility] [place], [place] [id number] Rush Farmer MD [id number]   Respiratory Panel by RT PCR (Flu A&B, Covid) - Nasopharyngeal Swab     Status: None   Collection Time: 11/11/19  9:36 PM   Specimen: Nasopharyngeal Swab  Result Value Ref Range   SARS Coronavirus 2 by RT PCR NEGATIVE NEGATIVE    Comment: (NOTE) SARS-CoV-2 target nucleic acids are NOT DETECTED. The SARS-CoV-2 RNA is generally detectable in upper respiratoy specimens during the acute phase of infection. The lowest concentration of SARS-CoV-2 viral copies this assay can detect is 131 copies/mL. A negative result does not preclude SARS-Cov-2 infection and should not be used as the sole basis for treatment or other patient management decisions. A negative result may occur with  improper specimen collection/handling, submission of specimen other than nasopharyngeal swab, presence of viral mutation(s) within the areas targeted by this assay, and inadequate number of viral copies (<131 copies/mL). A negative result must be combined with clinical observations, patient history, and epidemiological information. The expected result is Negative. Fact Sheet for Patients:  PinkCheek.be Fact Sheet for Healthcare Providers:   GravelBags.it This test is not yet ap proved or cleared by the [place] FDA and  has been authorized for detection and/or diagnosis of SARS-CoV-2 by FDA under an Emergency Use Authorization (EUA). This EUA will remain  in effect (meaning this test can be used) for the duration of the COVID-19 declaration under Section 564(b)(1) of the Act, 21 U.S.C. section 360bbb-3(b)(1), unless the authorization is terminated or revoked sooner.    Influenza A by PCR NEGATIVE NEGATIVE   Influenza B by PCR NEGATIVE NEGATIVE    Comment: (NOTE) The Xpert Xpress SARS-CoV-2/FLU/RSV assay is intended as an aid in  the diagnosis of influenza from Nasopharyngeal swab specimens and  should not be used as a sole basis for treatment. Nasal washings and  aspirates are unacceptable for Xpert Xpress SARS-CoV-2/FLU/RSV  testing. Fact Sheet for Patients: PinkCheek.be Fact Sheet for Healthcare Providers: GravelBags.it This test is not yet approved or cleared by the [place] FDA and  has been authorized for detection and/or diagnosis of SARS-CoV-2 by  FDA under an Emergency Use Authorization (EUA). This EUA will remain  in effect (meaning this test can be used) for the duration of the  Covid-19 declaration under Section 564(b)(1) of the Act, 21  U.S.C. section 360bbb-3(b)(1), unless the authorization is  terminated or revoked. Performed at [facility], [place] [address], [place], [place] [address]     Blood Alcohol level:  Lab Results  Component Value Date   [facility] <10 10/16/2019   ETH <10 [id number]    Metabolic Disorder Labs: Lab Results  Component Value Date   HGBA1C 5.4 10/16/2019   MPG 108.28 10/16/2019   MPG 102.54 05/17/2019   Lab Results  Component Value Date   PROLACTIN 113.0 (H) 11/11/2019   PROLACTIN 152.0 (H) 10/16/2019   Lab Results  Component Value Date   CHOL 138  10/16/2019   TRIG 60 10/16/2019   HDL 36 (L) 10/16/2019   CHOLHDL 3.8 10/16/2019   VLDL 12 10/16/2019  [place] 90 10/16/2019   LDLCALC 96 05/17/2019    Physical Findings: AIMS: Facial and Oral Movements Muscles of Facial Expression: None, normal Lips and Perioral Area: None, normal Jaw: None, normal Tongue: None, normal,Extremity Movements Upper (arms, wrists, hands, fingers): None, normal Lower (legs, knees, ankles, toes): None, normal, Trunk Movements Neck, shoulders, hips: None, normal, Overall Severity Severity of abnormal movements (highest score from questions above): None, normal Incapacitation due to abnormal movements: None, normal Patient's awareness of abnormal movements (rate only patient's report): No Awareness, Dental Status Current problems with teeth and/or dentures?: No Does patient usually wear dentures?: No  CIWA:  CIWA-Ar Total: 1 COWS:  COWS Total Score: 2  Musculoskeletal: Strength & Muscle Tone: within normal limits Gait & Station: normal Patient leans: N/A  Psychiatric Specialty Exam: Physical Exam  Nursing note and vitals reviewed. Constitutional: She is oriented to person, place, and time. She appears well-developed and well-nourished.  Cardiovascular: Normal rate.  Respiratory: Effort normal.  Neurological: She is alert and oriented to person, place, and time.    Review of Systems  Constitutional: Negative.   Respiratory: Negative for cough and shortness of breath.   Psychiatric/Behavioral: Negative for agitation, behavioral problems, confusion, dysphoric mood, hallucinations, self-injury, sleep disturbance and suicidal ideas. The patient is not nervous/anxious and is not hyperactive.     Blood pressure 114/80, pulse 100, temperature 98.1 F (36.7 C), temperature source Oral, resp. rate 18, height 5\' 3"  (1.6 m), weight 67.8 kg, SpO2 98 %.Body mass index is 26.48 kg/m.  General Appearance: Casual  Eye Contact:  Good  Speech:  Slow  Volume:   Normal  Mood:  Euthymic  Affect:  Non-Congruent and Flat  Thought Process:  Coherent  Orientation:  Full (Time, Place, and Person)  Thought Content:  Logical  Suicidal Thoughts:  No  Homicidal Thoughts:  No  Memory:  Immediate;   Fair Recent;   Fair  Judgement:  Fair  Insight:  Lacking  Psychomotor Activity:  Normal  Concentration:  Concentration: Fair and Attention Span: Fair  Recall:  [employer] of Knowledge:  Fair  Language:  Good  Akathisia:  No  Handed:  Right  AIMS (if indicated):     Assets:  Communication Skills Desire for Improvement Leisure Time Resilience  ADL's:  Intact  Cognition:  WNL  Sleep:  Number of Hours: 6.75     Treatment Plan Summary: Daily contact with patient to assess and evaluate symptoms and progress in treatment and Medication management   Continue inpatient hospitalization.  Continue Invega 12 mg PO QHS for psychosis Continue Cogentin 1 mg PO BID PRN tremors/EPS Continue Prozac 40 mg PO daily for depression/anxiety Continue gabapentin 300 mg PO TID for anxiety Continue propranolol 10 mg PO TID for anxiety Continue trazodone 50 mg PO QHS PRN insomnia Continue Haldol/Ativan PO/IM PRN agitation  Patient will participate in the therapeutic group milieu.  Discharge disposition in progress.  Connye Burkitt, NP 11/12/2019, 2:51 PM

## 2019-11-12 NOTE — Progress Notes (Signed)
Supervisor spoke with Julia [name] from the group home.  She is willing to accept pt if the financials can be worked out.  Supervisor to submit LOG paperwork. [name], MSW, LCSW [facility] Supervisor 11/12/2019 3:13 PM

## 2019-11-12 NOTE — Progress Notes (Signed)
   11/12/19 1034  Psych Admission Type (Psych Patients Only)  Admission Status Voluntary  Psychosocial Assessment  Patient Complaints Anxiety (Visible in dayroom, interacting well with others, smiling)  Eye Contact Fair  Facial Expression Flat  Affect Flat  Speech Logical/coherent  Interaction Cautious;Forwards little;Minimal;Manipulative (With staff)  Motor Activity Slow  Appearance/Hygiene Unremarkable  Behavior Characteristics Cooperative  Mood Depressed  Thought Process  Coherency Blocking (Slow to respond, with blank stares. )  Content WDL  Delusions None reported or observed  Perception WDL  Hallucination Auditory ("The voices are loud in my head".)  Judgment Poor  Confusion None  Danger to Self  Current suicidal ideation? Denies  Self-Injurious Behavior No self-injurious ideation or behavior indicators observed or expressed   Agreement Not to Harm Self Yes  Description of Agreement verbal  Danger to Others  Danger to Others None reported or observed

## 2019-11-12 NOTE — [facility] Group Notes (Addendum)
11/12/2019 8:45am Type of Group and Topic: Psychoeducational Group: Discharge Planning  Participation Level: Minimal  Description of Group Discharge planning group reviews patient's anticipated discharge plans and assists patients to anticipate and address any barriers to wellness/recovery in the community. Suicide prevention education is reviewed with patients in group. Therapeutic Goals 1. Patients will state their anticipated discharge plan and mental health aftercare 2. Patients will identify potential barriers to wellness in the community setting 3. Patients will engage in problem solving, solution focused discussion of ways to anticipate and address barriers to wellness/recovery   Summary of Patient Progress Plan for Discharge/Comments: Julia [name] reports she is planning to discharge to a new group home placement. She states she is waiting for the social worker to give her an update regarding a bed placement. Julia [name] did not have any further questions or concerns at this time.     Transportation Means: CSW will assist in providing transportation services at discharge.    Supports: Patient's father and aunt   Therapeutic Modalities: Motivational Interviewing     Radonna Ricker, MSW, [place] Worker [facility]  Phone: [phone] 11/12/2019 3:04 PM

## 2019-11-12 NOTE — Progress Notes (Signed)
Recreation Therapy Notes  Date:  4.30.21 Time: 0930 Location: [address] Group Room  Group Topic: Stress Management  Goal Area(s) Addresses:  Patient will identify positive stress management techniques. Patient will identify benefits of using stress management post d/c.  Behavioral Response: Engaged  Intervention: Stress Management  Activity:  Meditation.  LRT played a meditation that focused on being resilient in the face of adversity.  Patients were to listen and follow along as meditation played to engage in activity.  Education:  Stress Management, Discharge Planning.   Education Outcome: Acknowledges Education  Clinical Observations/Feedback: Pt attended and participated in activity.    [name], LRT/CTRS   [name] A 11/12/2019 11:45 AM

## 2019-11-13 NOTE — [facility] Group Notes (Signed)
LCSW Group Therapy Note  11/13/2019   10:00-11:00am   Type of Therapy and Topic:  Group Therapy: Anger Cues and Responses  Participation Level:  Minimal   Description of Group:   In this group, patients learned how to recognize the physical, cognitive, emotional, and behavioral responses they have to anger-provoking situations.  They identified a recent time they became angry and how they reacted.  They analyzed how their reaction was possibly beneficial and how it was possibly unhelpful.  The group discussed a variety of healthier coping skills that could help with such a situation in the future.  Focus was placed on how helpful it is to recognize the underlying emotions to our anger, because working on those can lead to a more permanent solution as well as our ability to focus on the important rather than the urgent.  Therapeutic Goals: 1. Patients will remember their last incident of anger and how they felt emotionally and physically, what their thoughts were at the time, and how they behaved. 2. Patients will identify how their behavior at that time worked for them, as well as how it worked against them. 3. Patients will explore possible new behaviors to use in future anger situations. 4. Patients will learn that anger itself is normal and cannot be eliminated, and that healthier reactions can assist with resolving conflict rather than worsening situations.  Summary of Patient Progress:  The patient shared that her most recent time of anger was due to her frustration with voices she hears. Patient appeared to experiencing internal stimuli during group and left before group was complete.  Therapeutic Modalities:   Cognitive Behavioral Therapy  Rolanda Jay

## 2019-11-13 NOTE — Progress Notes (Signed)
[facility] MD Progress Note  11/13/2019 2:40 PM SHALIA BARTKO  MRN:  [id number] Subjective:  Patient reports she is " doing OK".  Denies suicidal or self injurious ideations. She states she is feeling anxious. Denies medication side effects. Objective : I have reviewed chart notes and have met with 19 year old female, presented on 4/3 for depression and psychotic symptoms which she described as being mentally tortured by a person who was transmitting thoughts to her.She also describes some auditory hallucinations. She has been diagnosed with schizophrenia.  Patient presents alert, attentive, calm/no psychomotor agitation or restlessness at this time. Reports she has not been experiencing above described delusions or thought insertion ideations today. She does endorse intermittent auditory hallucinations, although improving . Currently does not present internally preoccupied and no delusions are spontaneously expressed . She does report she is feeling more anxious today, but is unsure why. Behavior on unit in good control, no disruptive or agitated behaviors. She has been visible in day room, going to some groups. Currently denies medication side effects.   Principal Problem: Schizophrenia, paranoid ([place]) Diagnosis: Principal Problem:   Schizophrenia, paranoid ([place]) Active Problems:   Depressed mood   Psychosis ([place])   MDD (major depressive disorder)  Total Time spent with patient: 15 minutes  Past Psychiatric History: See admission H&P  Past Medical History:  Past Medical History:  Diagnosis Date  . Burning with urination 05/03/2015  . Contraceptive management 05/03/2015  . Depression   . Dysmenorrhea 12/30/2013  . Heroin addiction ([place])   . Menorrhagia 12/30/2013  . Menstrual extraction 12/30/2013  . Migraines   . Social anxiety disorder 09/25/2016  . Suicidal ideations   . Vaginal odor 05/03/2015    Past Surgical History:  Procedure Laterality Date  . NO PAST SURGERIES     Family  History:  Family History  Problem Relation Age of Onset  . Depression Mother   . Hypertension Father   . Hyperlipidemia Father   . Cancer Paternal Grandmother        breast, uterine  . Cirrhosis Paternal Grandfather        due to alcohol   Family Psychiatric  History: See admission H&P Social History:  Social History   Substance and Sexual Activity  Alcohol Use Yes   Comment: BAC was clear     Social History   Substance and Sexual Activity  Drug Use Yes  . Types: Marijuana, Oxycodone   Comment: reports oxycodone and marijuana    Social History   Socioeconomic History  . Marital status: Single    Spouse name: Not on file  . Number of children: Not on file  . Years of education: Not on file  . Highest education level: Not on file  Occupational History  . Occupation: Unemployed  Tobacco Use  . Smoking status: Current Every Day Smoker    Packs/day: 0.50    Types: Cigarettes  . Smokeless tobacco: Never Used  . Tobacco comment: Not ready to quit  Substance and Sexual Activity  . Alcohol use: Yes    Comment: BAC was clear  . Drug use: Yes    Types: Marijuana, Oxycodone    Comment: reports oxycodone and marijuana  . Sexual activity: Yes    Birth control/protection: Implant  Other Topics Concern  . Not on file  Social History Narrative   06/23/2019:  Pt stated that she is homeless, that she is a high school graduate, and that she is unemployed and not followed by any outpatient provider.  Lives with Dad. Mom has LGD, lives in [place]. 11th grader. Dog.   Social Determinants of Health   Financial Resource Strain:   . Difficulty of Paying Living Expenses:   Food Insecurity:   . Worried About Charity fundraiser in the Last Year:   . Arboriculturist in the Last Year:   Transportation Needs:   . Film/video editor (Medical):   [name] Lack of Transportation (Non-Medical):   Physical Activity:   . Days of Exercise per Week:   . Minutes of Exercise per Session:    Stress:   . Feeling of Stress :   Social Connections:   . Frequency of Communication with Friends and Family:   . Frequency of Social Gatherings with Friends and Family:   . Attends Religious Services:   . Active Member of Clubs or Organizations:   . Attends Archivist Meetings:   [name] Marital Status:    Additional Social History:    Pain Medications: Please see MAR Prescriptions: Please see MAR, prozac and cogentin per pt Over the Counter: Please see MAR History of alcohol / drug use?: Yes Longest period of sobriety (when/how long): Unknown Negative Consequences of Use: Legal Name of Substance 1: alcohol 1 - Frequency: UTA 1 - Duration: UTA- pt denies 1 - Last Use / Amount: UTA  Sleep: Good  Appetite:  Good  Current Medications: Current Facility-Administered Medications  Medication Dose Route Frequency Provider Last Rate Last Admin  . acetaminophen (TYLENOL) tablet 650 mg  650 mg Oral Q6H PRN Emmaline Kluver, FNP      . alum & mag hydroxide-simeth (MAALOX/MYLANTA) 200-200-20 MG/5ML suspension 30 mL  30 mL Oral Q4H PRN Emmaline Kluver, FNP      . benztropine (COGENTIN) tablet 1 mg  1 mg Oral BID PRN Laverta Harnisch, Myer Peer, MD   1 mg at 10/27/19 1644  . FLUoxetine (PROZAC) capsule 40 mg  40 mg Oral Daily Kacee Koren, Myer Peer, MD   40 mg at 11/13/19 0814  . gabapentin (NEURONTIN) capsule 300 mg  300 mg Oral TID Lindell Spar I, NP   300 mg at 11/13/19 1136  . haloperidol (HALDOL) tablet 5 mg  5 mg Oral Q6H PRN Jasdeep Kepner, Myer Peer, MD   5 mg at 11/11/19 6213   Or  . haloperidol lactate (HALDOL) injection 5 mg  5 mg Intramuscular Q6H PRN Tillman Kazmierski, Myer Peer, MD      . LORazepam (ATIVAN) tablet 1 mg  1 mg Oral Q6H PRN Lindell Spar I, NP   1 mg at 11/13/19 1112   Or  . LORazepam (ATIVAN) injection 1 mg  1 mg Intramuscular Q6H PRN Nwoko, Agnes I, NP      . magnesium hydroxide (MILK OF MAGNESIA) suspension 30 mL  30 mL Oral Daily PRN Emmaline Kluver, FNP      . nicotine polacrilex (NICORETTE)  gum 2 mg  2 mg Oral PRN Abdo Denault, Myer Peer, MD   2 mg at 11/13/19 1252  . paliperidone (INVEGA) 24 hr tablet 12 mg  12 mg Oral QHS Connye Burkitt, NP   12 mg at 11/12/19 2105  . propranolol (INDERAL) tablet 10 mg  10 mg Oral TID Lindell Spar I, NP   10 mg at 11/13/19 1136  . traZODone (DESYREL) tablet 50 mg  50 mg Oral QHS PRN [name], NP   50 mg at 10/29/19 2109    Lab Results:  Results for orders placed or  performed during the hospital encounter of 10/15/19 (from the past 48 hour(s))  Prolactin     Status: Abnormal   Collection Time: 11/11/19  6:22 PM  Result Value Ref Range   Prolactin 113.0 (H) 4.8 - 23.3 ng/mL    Comment: (NOTE) Performed At: [facility] [place], [place] [id number] Rush Farmer MD [id number]   Respiratory Panel by RT PCR (Flu A&B, Covid) - Nasopharyngeal Swab     Status: None   Collection Time: 11/11/19  9:36 PM   Specimen: Nasopharyngeal Swab  Result Value Ref Range   SARS Coronavirus 2 by RT PCR NEGATIVE NEGATIVE    Comment: (NOTE) SARS-CoV-2 target nucleic acids are NOT DETECTED. The SARS-CoV-2 RNA is generally detectable in upper respiratoy specimens during the acute phase of infection. The lowest concentration of SARS-CoV-2 viral copies this assay can detect is 131 copies/mL. A negative result does not preclude SARS-Cov-2 infection and should not be used as the sole basis for treatment or other patient management decisions. A negative result may occur with  improper specimen collection/handling, submission of specimen other than nasopharyngeal swab, presence of viral mutation(s) within the areas targeted by this assay, and inadequate number of viral copies (<131 copies/mL). A negative result must be combined with clinical observations, patient history, and epidemiological information. The expected result is Negative. Fact Sheet for Patients:  PinkCheek.be Fact Sheet for Healthcare  Providers:  GravelBags.it This test is not yet ap proved or cleared by the [place] FDA and  has been authorized for detection and/or diagnosis of SARS-CoV-2 by FDA under an Emergency Use Authorization (EUA). This EUA will remain  in effect (meaning this test can be used) for the duration of the COVID-19 declaration under Section 564(b)(1) of the Act, 21 U.S.C. section 360bbb-3(b)(1), unless the authorization is terminated or revoked sooner.    Influenza A by PCR NEGATIVE NEGATIVE   Influenza B by PCR NEGATIVE NEGATIVE    Comment: (NOTE) The Xpert Xpress SARS-CoV-2/FLU/RSV assay is intended as an aid in  the diagnosis of influenza from Nasopharyngeal swab specimens and  should not be used as a sole basis for treatment. Nasal washings and  aspirates are unacceptable for Xpert Xpress SARS-CoV-2/FLU/RSV  testing. Fact Sheet for Patients: PinkCheek.be Fact Sheet for Healthcare Providers: GravelBags.it This test is not yet approved or cleared by the [place] FDA and  has been authorized for detection and/or diagnosis of SARS-CoV-2 by  FDA under an Emergency Use Authorization (EUA). This EUA will remain  in effect (meaning this test can be used) for the duration of the  Covid-19 declaration under Section 564(b)(1) of the Act, 21  U.S.C. section 360bbb-3(b)(1), unless the authorization is  terminated or revoked. Performed at [facility], [place] [address], [place], [place] [address]     Blood Alcohol level:  Lab Results  Component Value Date   [facility] <10 10/16/2019   ETH <10 81/82/9937    Metabolic Disorder Labs: Lab Results  Component Value Date   HGBA1C 5.4 10/16/2019   MPG 108.28 10/16/2019   MPG 102.54 05/17/2019   Lab Results  Component Value Date   PROLACTIN 113.0 (H) 11/11/2019   PROLACTIN 152.0 (H) 10/16/2019   Lab Results  Component Value Date   CHOL  138 10/16/2019   TRIG 60 10/16/2019   HDL 36 (L) 10/16/2019   CHOLHDL 3.8 10/16/2019   VLDL 12 10/16/2019   LDLCALC 90 10/16/2019   LDLCALC 96 05/17/2019    Physical Findings:  AIMS: Facial and Oral Movements Muscles of Facial Expression: None, normal Lips and Perioral Area: None, normal Jaw: None, normal Tongue: None, normal,Extremity Movements Upper (arms, wrists, hands, fingers): None, normal Lower (legs, knees, ankles, toes): None, normal, Trunk Movements Neck, shoulders, hips: None, normal, Overall Severity Severity of abnormal movements (highest score from questions above): None, normal Incapacitation due to abnormal movements: None, normal Patient's awareness of abnormal movements (rate only patient's report): No Awareness, Dental Status Current problems with teeth and/or dentures?: No Does patient usually wear dentures?: No  CIWA:  CIWA-Ar Total: 1 COWS:  COWS Total Score: 2  Musculoskeletal: Strength & Muscle Tone: within normal limits Gait & Station: normal Patient leans: N/A  Psychiatric Specialty Exam: Physical Exam  Nursing note and vitals reviewed. Constitutional: She is oriented to person, place, and time. She appears well-developed and well-nourished.  Cardiovascular: Normal rate.  Respiratory: Effort normal.  Neurological: She is alert and oriented to person, place, and time.    Review of Systems  Constitutional: Negative.   Respiratory: Negative for cough and shortness of breath.   Psychiatric/Behavioral: Negative for agitation, behavioral problems, confusion, dysphoric mood, hallucinations, self-injury, sleep disturbance and suicidal ideas. The patient is not nervous/anxious and is not hyperactive.   denies chest pain, no shortness of breath, no vomiting   Blood pressure 112/75, pulse 77, temperature 98.3 F (36.8 C), temperature source Oral, resp. rate 18, height '5\' 3"'$  (1.6 m), weight 67.8 kg, SpO2 99 %.Body mass index is 26.48 kg/m.  General  Appearance: Casual  Eye Contact:  Good  Speech:  Slow  Volume:  Normal  Mood:  reports mood is " all right", and currently denies feeling depressed   Affect:  remains flat, blunted, although today did smile briefly /appropriately during session  Thought Process:  Linear and Descriptions of Associations: Circumstantial  Orientation:  Other:  fully alert and attentive  Thought Content:  does not appear internally preoccupied at present . No current hallucinations .  Appears less focused on delusional ideations.  Suicidal Thoughts:  No denies suicidal or self injurious ideations. Denies homicidal or violent ideations.   Homicidal Thoughts:  No  Memory:  recent and remote grossly intact   Judgement:  Other:  fair/ improving   Insight:  fair/ improving   Psychomotor Activity:  Normal- visible in day room  Concentration:  Concentration: improving  and Attention Span: improving   Recall:  recent and remote grossly intact   Fund of Knowledge:  Good  Language:  Good  Akathisia:  No  Handed:  Right  AIMS (if indicated):     Assets:  Communication Skills Desire for Improvement Leisure Time Resilience  ADL's:  Intact  Cognition:  WNL  Sleep:  Number of Hours: 6.75   Assessment :  19 year old female, presented on 4/3 for depression and psychotic symptoms which she described as being mentally tortured by a person who was transmitting thoughts to her.She also describes some auditory hallucinations. She has been diagnosed with schizophrenia.  Patient presents with gradual/partial improvement. She appears less focused on delusional ideations and today does not report thought insertion ideations. She does endorse intermittent auditory hallucinations but does not appear internally preoccupied . She has continued to present with a blunted /flat affect but today noticed to smile briefly during session. Tolerating Invega well , does not currently endorse side effects. Prolactin remains elevated but  has decreased compared to prior . Denies galactorrhea.    Treatment Plan Summary: Daily contact with patient to assess and  evaluate symptoms and progress in treatment and Medication management  Treatment plan reviewed as below today 5/1  Encourage group and milieu participation Continue Invega 12 mg PO QHS for psychosis Continue Cogentin 1 mg PO BID PRN tremors/EPS Continue Prozac 40 mg PO daily for depression/anxiety Continue gabapentin 300 mg PO TID for anxiety Continue propranolol 10 mg PO TID for anxiety Continue Trazodone 50 mg PO QHS PRN insomnia Continue Haldol/Ativan PO/IM PRN agitation Treatment team working on disposition planning options- has been referred to [facility], had interview earlier this week and staff awaiting review and information as to whether she is going to be admitted there.   Jenne Campus, MD 11/13/2019, 2:40 PM   Patient ID: [name], female   DOB: Jul 29, 2000, 19 y.o.   MRN: [id number]

## 2019-11-13 NOTE — Progress Notes (Signed)
Pt presents with a flat affect and a depressive mood. She denies SI/HI. She denies feeling depressed or anxious today but appears withdrawn and isolative to her room. She denies AVH. Pt reports fair sleep at bedtime. She is compliant with taking medications and denies having any side effects. No side effects observed.   Orders reviewed. V/s assessed. Verbal support provided. Pt encouraged to attend groups. 15 minute checks performed for safety.  No concerns verbalized by the pt.

## 2019-11-13 NOTE — Progress Notes (Signed)
Psychoeducational Group Note  Date:  11/13/2019 Time:  2030  Group Topic/Focus:  wrap up group  Participation Level: Did Not Attend  Participation Quality:  Not Applicable  Affect:  Not Applicable  Cognitive:  Not Applicable  Insight:  Not Applicable  Engagement in Group: Not Applicable  Additional Comments:  Pt notified that group was beginning but remained in bed.   Shellia Cleverly 11/13/2019, 8:55 PM

## 2019-11-13 NOTE — Progress Notes (Signed)
Pt isolates to room, but stated she was ok anxious about upcoming D/C    11/13/19 0000  Psych Admission Type (Psych Patients Only)  Admission Status Voluntary  Psychosocial Assessment  Patient Complaints Anxiety  Eye Contact Fair  Facial Expression Flat  Affect Flat  Speech Logical/coherent  Interaction Cautious;Forwards little;Minimal;Manipulative (With staff)  Motor Activity Slow  Appearance/Hygiene Unremarkable  Behavior Characteristics Cooperative  Mood Depressed  Thought Process  Coherency Blocking (Slow to respond, with blank stares. )  Content WDL  Delusions None reported or observed  Perception WDL  Hallucination Auditory ("The voices are loud in my head".)  Judgment Poor  Confusion None  Danger to Self  Current suicidal ideation? Denies  Self-Injurious Behavior No self-injurious ideation or behavior indicators observed or expressed   Agreement Not to Harm Self Yes  Description of Agreement verbal  Danger to Others  Danger to Others None reported or observed

## 2019-11-13 NOTE — Progress Notes (Signed)
   11/13/19 2149  Psych Admission Type (Psych Patients Only)  Admission Status Voluntary  Psychosocial Assessment  Patient Complaints None  Eye Contact Brief  Facial Expression Flat  Affect Appropriate to circumstance  Speech Logical/coherent  Interaction Minimal  Motor Activity Slow  Appearance/Hygiene Unremarkable  Behavior Characteristics Appropriate to situation  Mood Depressed  Thought Process  Coherency WDL  Content WDL  Delusions None reported or observed  Perception WDL  Hallucination None reported or observed  Judgment Poor  Confusion None  Danger to Self  Current suicidal ideation? Denies  Self-Injurious Behavior No self-injurious ideation or behavior indicators observed or expressed   Agreement Not to Harm Self Yes  Description of Agreement verbal  Danger to Others  Danger to Others None reported or observed

## 2019-11-13 NOTE — Progress Notes (Signed)
   11/13/19 2144  COVID-19 Daily Checkoff  Have you had a fever (temp > 37.80C/100F)  in the past 24 hours?  No  If you have had runny nose, nasal congestion, sneezing in the past 24 hours, has it worsened? No  COVID-19 EXPOSURE  Have you traveled outside the state in the past 14 days? No  Have you been in contact with someone with a confirmed diagnosis of COVID-19 or PUI in the past 14 days without wearing appropriate PPE? No  Have you been living in the same home as a person with confirmed diagnosis of COVID-19 or a PUI (household contact)? No  Have you been diagnosed with COVID-19? No

## 2019-11-14 MED ORDER — TRAZODONE HCL 50 MG PO TABS
25.0000 mg | ORAL_TABLET | Freq: Every evening | ORAL | Status: DC | PRN
  Administered 2019-11-22 – 2019-11-25 (×4): 25 mg via ORAL
  Filled 2019-11-14 (×2): qty 1
  Filled 2019-11-14: qty 7
  Filled 2019-11-14 (×2): qty 1
  Filled 2019-11-14: qty 11

## 2019-11-14 MED ORDER — PALIPERIDONE ER 6 MG PO TB24
9.0000 mg | ORAL_TABLET | Freq: Every day | ORAL | Status: DC
  Administered 2019-11-14 – 2019-11-17 (×4): 9 mg via ORAL
  Filled 2019-11-14 (×5): qty 1

## 2019-11-14 MED ORDER — NICOTINE 7 MG/24HR TD PT24
7.0000 mg | MEDICATED_PATCH | Freq: Every day | TRANSDERMAL | Status: DC
  Administered 2019-11-14 – 2019-11-23 (×10): 7 mg via TRANSDERMAL
  Filled 2019-11-14 (×14): qty 1

## 2019-11-14 NOTE — Progress Notes (Signed)
Around 1550 the pt was heard yelling and cursing at the [facility] on the [place] because she wanted a snack. She then began crying, stating "I hate living like this. I hate hearing voices and seeing things." The pt was administered Ativan for agiation. On follow up, the pt was no longer agitated and apologized for yelling. Pt safety maintained.

## 2019-11-14 NOTE — Progress Notes (Signed)
   11/14/19 2050  Psych Admission Type (Psych Patients Only)  Admission Status Voluntary  Psychosocial Assessment  Patient Complaints Anxiety  Eye Contact Brief  Facial Expression Flat  Affect Appropriate to circumstance  Speech Logical/coherent  Interaction Minimal  Motor Activity Other (Comment) (WDL)  Appearance/Hygiene Unremarkable  Behavior Characteristics Appropriate to situation  Mood Depressed  Thought Process  Coherency WDL  Content WDL  Delusions Paranoid  Perception WDL  Hallucination Auditory  Judgment Poor  Confusion None  Danger to Self  Current suicidal ideation? Denies  Self-Injurious Behavior No self-injurious ideation or behavior indicators observed or expressed   Agreement Not to Harm Self Yes  Description of Agreement verbal  Danger to Others  Danger to Others None reported or observed

## 2019-11-14 NOTE — Progress Notes (Signed)
   11/14/19 2046  COVID-19 Daily Checkoff  Have you had a fever (temp > 37.80C/100F)  in the past 24 hours?  No  If you have had runny nose, nasal congestion, sneezing in the past 24 hours, has it worsened? No  COVID-19 EXPOSURE  Have you traveled outside the state in the past 14 days? No  Have you been in contact with someone with a confirmed diagnosis of COVID-19 or PUI in the past 14 days without wearing appropriate PPE? No  Have you been living in the same home as a person with confirmed diagnosis of COVID-19 or a PUI (household contact)? No  Have you been diagnosed with COVID-19? No

## 2019-11-14 NOTE — [facility] Group Notes (Signed)
[place] LCSW Group Therapy Note  Date/Time:  11/14/2019 9:00-10:00 or 10:00-11:00AM  Type of Therapy and Topic:  Group Therapy:  Healthy and Unhealthy Supports  Participation Level:  Minimal   Description of Group:  Patients in this group were introduced to the idea of adding a variety of healthy supports to address the various needs in their lives.Patients discussed what additional healthy supports could be helpful in their recovery and wellness after discharge in order to prevent future hospitalizations.   An emphasis was placed on using counselor, doctor, therapy groups, 12-step groups, and problem-specific support groups to expand supports.  They also worked as a group on developing a specific plan for several patients to deal with unhealthy supports through [place], psychoeducation with loved ones, and even termination of relationships.   Therapeutic Goals:   1)  discuss importance of adding supports to stay well once out of the hospital  2)  compare healthy versus unhealthy supports and identify some examples of each  3)  generate ideas and descriptions of healthy supports that can be added  4)  offer mutual support about how to address unhealthy supports  5)  encourage active participation in and adherence to discharge plan    Summary of Patient Progress:  The patient stated that current healthy supports in her life are her father, boyfriend, and her aunts while  Not identifying anycurrent unhealthy supports.  The patient expressed a willingness to add friends as support(s) to help in her recovery journey.   Therapeutic Modalities:   Motivational Interviewing Brief Solution-Focused Therapy  Rolanda Jay

## 2019-11-14 NOTE — Progress Notes (Addendum)
Pt presents with a flat affect and a depressed mood. She reports decreased depression and ongoing anxiety that is relieved by taking ativan. She denies SI/HI. She endorses intermittent AH. She denies VH. She is paranoid and suspicious of others. She reported feeling paranoid this morning and requested medicine. She was administered Haldol at her request. She appears withdrawn and is isolative to her room. She will attend some groups with encouragement from staff. She reports fair sleep last night.   Orders reviewed. V/S assessed. Verbal support provided. Pt encouraged to attend groups. 15 minute checks performed for safety.  Pt compliant with taking medications and denies medication side effects.

## 2019-11-14 NOTE — Progress Notes (Signed)
Psychoeducational Group Note  Date:  11/14/2019 Time: 2030  Group Topic/Focus:  wrap up group  Participation Level: Did Not Attend  Participation Quality:  Not Applicable  Affect:  Not Applicable  Cognitive:  Not Applicable  Insight:  Not Applicable  Engagement in Group: Not Applicable  Additional Comments:  Pt was notified that group was beginning but remained in bed.   Shellia Cleverly 11/14/2019, 9:23 PM
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
## 2019-11-15 NOTE — [facility] Group Notes (Signed)
LCSW Group Therapy Note 11/15/2019 11:52 AM  Type of Therapy and Topic: Group Therapy: Overcoming Obstacles  Participation Level: Minimal  Description of Group:  In this group patients will be encouraged to explore what they see as obstacles to their own wellness and recovery. They will be guided to discuss their thoughts, feelings, and behaviors related to these obstacles. The group will process together ways to cope with barriers, with attention given to specific choices patients can make. Each patient will be challenged to identify changes they are motivated to make in order to overcome their obstacles. This group will be process-oriented, with patients participating in exploration of their own experiences as well as giving and receiving support and challenge from other group members.  Therapeutic Goals: 1. Patient will identify personal and current obstacles as they relate to admission. 2. Patient will identify barriers that currently interfere with their wellness or overcoming obstacles.  3. Patient will identify feelings, thought process and behaviors related to these barriers. 4. Patient will identify two changes they are willing to make to overcome these obstacles:   Summary of Patient Progress  Mitsuko was engaged throughout the group session, however she did not contribute to the group's discussion. Dashai reports her only obstacle is her court date on 11/29/19. Althia reports she does not believe she has to go to jail, however she is concerned about paying a "fine".     Therapeutic Modalities:  Cognitive Behavioral Therapy Solution Focused Therapy Motivational Interviewing Relapse Prevention Therapy   Theresa Duty Clinical Social Worker

## 2019-11-15 NOTE — Tx Team (Cosign Needed)
Interdisciplinary Treatment and Diagnostic Plan Update  11/15/2019 Time of Session: 8:45am  Julia [name] MRN: [id number]  Principal Diagnosis: Schizophrenia, paranoid [facility])  Secondary Diagnoses: Principal Problem:   Schizophrenia, paranoid ([place]) Active Problems:   Depressed mood   Psychosis ([place])   MDD (major depressive disorder)   Current Medications:  Current Facility-Administered Medications  Medication Dose Route Frequency Provider Last Rate Last Admin  . acetaminophen (TYLENOL) tablet 650 mg  650 mg Oral Q6H PRN Emmaline Kluver, FNP      . alum & mag hydroxide-simeth (MAALOX/MYLANTA) 200-200-20 MG/5ML suspension 30 mL  30 mL Oral Q4H PRN Emmaline Kluver, FNP      . benztropine (COGENTIN) tablet 1 mg  1 mg Oral BID PRN Cobos, Myer Peer, MD   1 mg at 10/27/19 1644  . FLUoxetine (PROZAC) capsule 40 mg  40 mg Oral Daily Cobos, Myer Peer, MD   40 mg at 11/15/19 0817  . gabapentin (NEURONTIN) capsule 300 mg  300 mg Oral TID Lindell Spar I, NP   300 mg at 11/15/19 0817  . haloperidol (HALDOL) tablet 5 mg  5 mg Oral Q6H PRN Cobos, Myer Peer, MD   5 mg at 11/14/19 1002   Or  . haloperidol lactate (HALDOL) injection 5 mg  5 mg Intramuscular Q6H PRN Cobos, Myer Peer, MD      . LORazepam (ATIVAN) tablet 1 mg  1 mg Oral Q6H PRN Lindell Spar I, NP   1 mg at 11/14/19 2212   Or  . LORazepam (ATIVAN) injection 1 mg  1 mg Intramuscular Q6H PRN Nwoko, Agnes I, NP      . magnesium hydroxide (MILK OF MAGNESIA) suspension 30 mL  30 mL Oral Daily PRN Emmaline Kluver, FNP      . nicotine (NICODERM CQ - dosed in mg/24 hr) patch 7 mg  7 mg Transdermal Daily Cobos, Myer Peer, MD   7 mg at 11/15/19 0818  . paliperidone (INVEGA) 24 hr tablet 9 mg  9 mg Oral QHS Cobos, Myer Peer, MD   9 mg at 11/14/19 2212  . traZODone (DESYREL) tablet 25 mg  25 mg Oral QHS PRN Cobos, Myer Peer, MD       PTA Medications: Medications Prior to Admission  Medication Sig Dispense Refill Last Dose  . ibuprofen (ADVIL) 400 MG  tablet Take 400 mg by mouth every 6 (six) hours as needed for headache or mild pain.     . benztropine (COGENTIN) 1 MG tablet Take 1 tablet (1 mg total) by mouth 2 (two) times daily. (Patient not taking: Reported on 10/16/2019) 60 tablet 1 Not Taking at Unknown time  . FLUoxetine (PROZAC) 20 MG capsule Take 1 capsule (20 mg total) by mouth daily. (Patient not taking: Reported on 10/16/2019) 30 capsule 1 Not Taking at Unknown time  . gabapentin (NEURONTIN) 400 MG capsule Take 1 capsule (400 mg total) by mouth 3 (three) times daily. (Patient not taking: Reported on 10/16/2019) 90 capsule 1 Not Taking at Unknown time  . haloperidol decanoate (HALDOL DECANOATE) 100 MG/ML injection Inject 0.5 mLs (50 mg total) into the muscle every 30 (thirty) days. NEXT DOSE DUE AROUND September 15, 2019 (Patient not taking: Reported on 10/16/2019) 1 mL 1 Not Taking at Unknown time  . paliperidone (INVEGA SUSTENNA) 156 MG/ML SUSY injection Inject 1 mL (156 mg total) into the muscle every 30 (thirty) days. NEXT DOSE DUE AROUND September 15, 2019 (Patient not taking: Reported on 10/16/2019) 1.2 mL 1  Not Taking at Unknown time  . paliperidone (INVEGA) 3 MG 24 hr tablet Take 1 tablet (3 mg total) by mouth daily. (Patient not taking: Reported on 10/16/2019) 30 tablet 1 Not Taking at Unknown time  . traZODone (DESYREL) 50 MG tablet Take 1 tablet (50 mg total) by mouth at bedtime. (Patient not taking: Reported on 10/16/2019) 30 tablet 1 Not Taking at Unknown time    Patient Stressors:    Patient Strengths:    Treatment Modalities: Medication Management, Group therapy, Case management,  1 to 1 session with clinician, Psychoeducation, Recreational therapy.   Physician Treatment Plan for Primary Diagnosis: Schizophrenia, paranoid ([place]) Long Term Goal(s): Improvement in symptoms so as ready for discharge Improvement in symptoms so as ready for discharge   Short Term Goals: Ability to identify changes in lifestyle to reduce recurrence of condition  will improve Ability to verbalize feelings will improve Ability to disclose and discuss suicidal ideas Ability to demonstrate self-control will improve Ability to identify and develop effective coping behaviors will improve Ability to maintain clinical measurements within normal limits will improve Compliance with prescribed medications will improve Ability to identify changes in lifestyle to reduce recurrence of condition will improve Ability to verbalize feelings will improve Ability to disclose and discuss suicidal ideas Ability to demonstrate self-control will improve Ability to identify and develop effective coping behaviors will improve Ability to maintain clinical measurements within normal limits will improve Compliance with prescribed medications will improve  Medication Management: Evaluate patient's response, side effects, and tolerance of medication regimen.  Therapeutic Interventions: 1 to 1 sessions, Unit Group sessions and Medication administration.  Evaluation of Outcomes: Adequate for Discharge  Physician Treatment Plan for Secondary Diagnosis: Principal Problem:   Schizophrenia, paranoid ([place]) Active Problems:   Depressed mood   Psychosis ([place])   MDD (major depressive disorder)  Long Term Goal(s): Improvement in symptoms so as ready for discharge Improvement in symptoms so as ready for discharge   Short Term Goals: Ability to identify changes in lifestyle to reduce recurrence of condition will improve Ability to verbalize feelings will improve Ability to disclose and discuss suicidal ideas Ability to demonstrate self-control will improve Ability to identify and develop effective coping behaviors will improve Ability to maintain clinical measurements within normal limits will improve Compliance with prescribed medications will improve Ability to identify changes in lifestyle to reduce recurrence of condition will improve Ability to verbalize feelings will  improve Ability to disclose and discuss suicidal ideas Ability to demonstrate self-control will improve Ability to identify and develop effective coping behaviors will improve Ability to maintain clinical measurements within normal limits will improve Compliance with prescribed medications will improve     Medication Management: Evaluate patient's response, side effects, and tolerance of medication regimen.  Therapeutic Interventions: 1 to 1 sessions, Unit Group sessions and Medication administration.  Evaluation of Outcomes: Adequate for Discharge   RN Treatment Plan for Primary Diagnosis: Schizophrenia, paranoid ([place]) Long Term Goal(s): Knowledge of disease and therapeutic regimen to maintain health will improve  Short Term Goals: Ability to participate in decision making will improve, Ability to verbalize feelings will improve, Ability to disclose and discuss suicidal ideas, Ability to identify and develop effective coping behaviors will improve and Compliance with prescribed medications will improve  Medication Management: RN will administer medications as ordered by provider, will assess and evaluate patient's response and provide education to patient for prescribed medication. RN will report any adverse and/or side effects to prescribing provider.  Therapeutic Interventions: 1 on  1 counseling sessions, Psychoeducation, Medication administration, Evaluate responses to treatment, Monitor vital signs and CBGs as ordered, Perform/monitor CIWA, COWS, AIMS and Fall Risk screenings as ordered, Perform wound care treatments as ordered.  Evaluation of Outcomes: Adequate for Discharge   LCSW Treatment Plan for Primary Diagnosis: Schizophrenia, paranoid ([place]) Long Term Goal(s): Safe transition to appropriate next level of care at discharge, Engage patient in therapeutic group addressing interpersonal concerns.  Short Term Goals: Engage patient in aftercare planning with referrals and  resources and Increase skills for wellness and recovery  Therapeutic Interventions: Assess for all discharge needs, 1 to 1 time with Social worker, Explore available resources and support systems, Assess for adequacy in community support network, Educate family and significant other(s) on suicide prevention, Complete Psychosocial Assessment, Interpersonal group therapy.  Evaluation of Outcomes: Progressing   Progress in Treatment: Attending groups: Yes. Participating in groups: Yes. Taking medication as prescribed: Yes. Toleration medication: Yes. Family/Significant other contact made: Yes, individual(s) contacted:  with pt's father  Patient understands diagnosis: Yes. Discussing patient identified problems/goals with staff: Yes. Medical problems stabilized or resolved: Yes. Denies suicidal/homicidal ideation: Yes. Issues/concerns per patient self-inventory: No. Other:   New problem(s) identified: Yes, Describe:  group home placement   New Short Term/Long Term Goal(s): Medication stabilization, elimination of SI thoughts, and development of a comprehensive mental wellness plan.   Patient Goals:    Discharge Plan or Barriers: Patient is being reviewed for possible group home placement. CSW will continue to follow up for appropriate referrals and possible discharge planning  Reason for Continuation of Hospitalization: Anxiety Delusions  Medication stabilization  Estimated Length of Stay: TBD  Attendees: Patient:  10/22/2019   Physician: Dr. Jake Samples, MD; Dr. Neita Garnet, MD; Dr. Myles Lipps, MD 10/22/2019   Nursing: Gilberto Better., RN 10/22/2019   RN Care Manager: 10/22/2019   Social Worker: Lurline Idol, LCSW; Radonna Ricker, LCSW 10/22/2019   Recreational Therapist:  10/22/2019   Other: Ovidio Kin, MSW intern  10/22/2019   Other:  10/22/2019   Other: 10/22/2019      Scribe for Treatment Team: Marylee Floras, [place] 11/15/2019 9:46 AM

## 2019-11-15 NOTE — Progress Notes (Signed)
   11/15/19 1118  Vital Signs  Temp (!) 97.5 F (36.4 C)  Temp Source Oral  Pulse Rate (!) 115  BP 113/75  BP Location Right Arm  BP Method Automatic  Patient Position (if appropriate) Standing  D:  Patient presents with a flat affect. Patient isolated in her room.  Patient reported anxiety 9/10. 1 mg of ativan given PO. A: Patient took scheduled medicine.  Support and encouragement provided Routine safety checks conducted every 15 minutes. Patient  Informed to notify staff with any concerns.  Safety maintained. R:  No adverse drug reactions noted.  Patient contracts for safety.  Patient compliant with medication and treatment plan. Patient cooperative and calm.  Safety maintained.

## 2019-11-15 NOTE — Progress Notes (Signed)
Recreation Therapy Notes  Date:  5.3.21 Time: 0930 Location: [address] Dayroom  Group Topic: Stress Management  Goal Area(s) Addresses:  Patient will identify positive stress management techniques. Patient will identify benefits of using stress management post d/c.  Intervention: Stress Management  Activity: Guided Imagery.  LRT read a script that took patients on a journey to the beach to hear the peaceful waves.  Patients were to listen and follow along as script was read to engage in activity.    Education:  Stress Management, Discharge Planning.   Education Outcome: Acknowledges Education  Clinical Observations/Feedback: Pt did not attend activity.    [name], LRT/CTRS         [name] A 11/15/2019 12:10 PM

## 2019-11-15 NOTE — Progress Notes (Signed)
[facility] MD Progress Note  11/15/2019 10:18 AM Julia [name]  MRN:  [id number] Subjective:  "I'm fine."  Julia [name] found lying in bed. She remains at baseline, with flat affect and reporting stable mood. Medication adjustments were made yesterday due to hypotension. VSS this morning, with blood pressure 108/66. She had an episode of agitation yesterday related to wanting a snack but improved with PRN medications. She reports stable mood this morning. She denies SI/HI/AVH. She denies delusions of thought insertion. She shows no signs of responding to internal stimuli. She has been accepted to a group home pending financing.   From admission H&P:[name] with a past psychiatric history significant for schizophrenia, unspecified depression, methamphetamine use disorder in partial remission who was admitted on 10/16/2019 after she had left a group home, and developed suicidal ideation, auditory hallucinations and paranoia.  Principal Problem: Schizophrenia, paranoid ([place]) Diagnosis: Principal Problem:   Schizophrenia, paranoid ([place]) Active Problems:   Depressed mood   Psychosis ([place])   MDD (major depressive disorder)  Total Time spent with patient: 15 minutes  Past Psychiatric History: See admission H&P  Past Medical History:  Past Medical History:  Diagnosis Date  . Burning with urination 05/03/2015  . Contraceptive management 05/03/2015  . Depression   . Dysmenorrhea 12/30/2013  . Heroin addiction ([place])   . Menorrhagia 12/30/2013  . Menstrual extraction 12/30/2013  . Migraines   . Social anxiety disorder 09/25/2016  . Suicidal ideations   . Vaginal odor 05/03/2015    Past Surgical History:  Procedure Laterality Date  . NO PAST SURGERIES     Family History:  Family History  Problem Relation Age of Onset  . Depression Mother   . Hypertension Father   . Hyperlipidemia Father   . Cancer Paternal Grandmother        breast, uterine  . Cirrhosis Paternal Grandfather         due to alcohol   Family Psychiatric  History: See admission H&P Social History:  Social History   Substance and Sexual Activity  Alcohol Use Yes   Comment: BAC was clear     Social History   Substance and Sexual Activity  Drug Use Yes  . Types: Marijuana, Oxycodone   Comment: reports oxycodone and marijuana    Social History   Socioeconomic History  . Marital status: Single    Spouse name: Not on file  . Number of children: Not on file  . Years of education: Not on file  . Highest education level: Not on file  Occupational History  . Occupation: Unemployed  Tobacco Use  . Smoking status: Current Every Day Smoker    Packs/day: 0.50    Types: Cigarettes  . Smokeless tobacco: Never Used  . Tobacco comment: Not ready to quit  Substance and Sexual Activity  . Alcohol use: Yes    Comment: BAC was clear  . Drug use: Yes    Types: Marijuana, Oxycodone    Comment: reports oxycodone and marijuana  . Sexual activity: Yes    Birth control/protection: Implant  Other Topics Concern  . Not on file  Social History Narrative   06/23/2019:  Pt stated that she is homeless, that she is a high school graduate, and that she is unemployed and not followed by any outpatient provider.      Lives with Dad. Mom has LGD, lives in [place]. 11th grader. Dog.   Social Determinants of Health   Financial Resource Strain:   . Difficulty  of Paying Living Expenses:   Food Insecurity:   . Worried About Charity fundraiser in the Last Year:   . Arboriculturist in the Last Year:   Transportation Needs:   . Film/video editor (Medical):   [name] Lack of Transportation (Non-Medical):   Physical Activity:   . Days of Exercise per Week:   . Minutes of Exercise per Session:   Stress:   . Feeling of Stress :   Social Connections:   . Frequency of Communication with Friends and Family:   . Frequency of Social Gatherings with Friends and Family:   . Attends Religious Services:   . Active Member  of Clubs or Organizations:   . Attends Archivist Meetings:   [name] Marital Status:    Additional Social History:    Pain Medications: Please see MAR Prescriptions: Please see MAR, prozac and cogentin per pt Over the Counter: Please see MAR History of alcohol / drug use?: Yes Longest period of sobriety (when/how long): Unknown Negative Consequences of Use: Legal Name of Substance 1: alcohol 1 - Frequency: UTA 1 - Duration: UTA- pt denies 1 - Last Use / Amount: UTA                  Sleep: Good  Appetite:  Good  Current Medications: Current Facility-Administered Medications  Medication Dose Route Frequency Provider Last Rate Last Admin  . acetaminophen (TYLENOL) tablet 650 mg  650 mg Oral Q6H PRN Emmaline Kluver, FNP      . alum & mag hydroxide-simeth (MAALOX/MYLANTA) 200-200-20 MG/5ML suspension 30 mL  30 mL Oral Q4H PRN Emmaline Kluver, FNP      . benztropine (COGENTIN) tablet 1 mg  1 mg Oral BID PRN Cobos, Myer Peer, MD   1 mg at 10/27/19 1644  . FLUoxetine (PROZAC) capsule 40 mg  40 mg Oral Daily Cobos, Myer Peer, MD   40 mg at 11/15/19 0817  . gabapentin (NEURONTIN) capsule 300 mg  300 mg Oral TID Lindell Spar I, NP   300 mg at 11/15/19 0817  . haloperidol (HALDOL) tablet 5 mg  5 mg Oral Q6H PRN Cobos, Myer Peer, MD   5 mg at 11/14/19 1002   Or  . haloperidol lactate (HALDOL) injection 5 mg  5 mg Intramuscular Q6H PRN Cobos, Myer Peer, MD      . LORazepam (ATIVAN) tablet 1 mg  1 mg Oral Q6H PRN Lindell Spar I, NP   1 mg at 11/14/19 2212   Or  . LORazepam (ATIVAN) injection 1 mg  1 mg Intramuscular Q6H PRN Nwoko, Agnes I, NP      . magnesium hydroxide (MILK OF MAGNESIA) suspension 30 mL  30 mL Oral Daily PRN Emmaline Kluver, FNP      . nicotine (NICODERM CQ - dosed in mg/24 hr) patch 7 mg  7 mg Transdermal Daily Cobos, Myer Peer, MD   7 mg at 11/15/19 0818  . paliperidone (INVEGA) 24 hr tablet 9 mg  9 mg Oral QHS Cobos, Myer Peer, MD   9 mg at 11/14/19 2212  .  traZODone (DESYREL) tablet 25 mg  25 mg Oral QHS PRN Cobos, Myer Peer, MD        Lab Results: No results found for this or any previous visit (from the past 48 hour(s)).  Blood Alcohol level:  Lab Results  Component Value Date   ETH <10 10/16/2019   ETH <10 07/09/2019  Metabolic Disorder Labs: Lab Results  Component Value Date   HGBA1C 5.4 10/16/2019   MPG 108.28 10/16/2019   MPG 102.54 05/17/2019   Lab Results  Component Value Date   PROLACTIN 113.0 (H) 11/11/2019   PROLACTIN 152.0 (H) 10/16/2019   Lab Results  Component Value Date   CHOL 138 10/16/2019   TRIG 60 10/16/2019   HDL 36 (L) 10/16/2019   CHOLHDL 3.8 10/16/2019   VLDL 12 10/16/2019   LDLCALC 90 10/16/2019   LDLCALC 96 05/17/2019    Physical Findings: AIMS: Facial and Oral Movements Muscles of Facial Expression: None, normal Lips and Perioral Area: None, normal Jaw: None, normal Tongue: None, normal,Extremity Movements Upper (arms, wrists, hands, fingers): None, normal Lower (legs, knees, ankles, toes): None, normal, Trunk Movements Neck, shoulders, hips: None, normal, Overall Severity Severity of abnormal movements (highest score from questions above): None, normal Incapacitation due to abnormal movements: None, normal Patient's awareness of abnormal movements (rate only patient's report): No Awareness, Dental Status Current problems with teeth and/or dentures?: No Does patient usually wear dentures?: No  CIWA:  CIWA-Ar Total: 1 COWS:  COWS Total Score: 2  Musculoskeletal: Strength & Muscle Tone: within normal limits Gait & Station: normal Patient leans: N/A  Psychiatric Specialty Exam: Physical Exam  Nursing note and vitals reviewed. Constitutional: She is oriented to person, place, and time. She appears well-developed and well-nourished.  Cardiovascular: Normal rate.  Respiratory: Effort normal.  Neurological: She is alert and oriented to person, place, and time.    Review of Systems   Constitutional: Negative.   Respiratory: Negative for cough and shortness of breath.   Psychiatric/Behavioral: Negative for agitation, behavioral problems, confusion, dysphoric mood, hallucinations, self-injury, sleep disturbance and suicidal ideas. The patient is not nervous/anxious and is not hyperactive.     Blood pressure 108/66, pulse 86, temperature 98.3 F (36.8 C), temperature source Oral, resp. rate 18, height 5\' 3"  (1.6 m), weight 67.8 kg, SpO2 98 %.Body mass index is 26.48 kg/m.  General Appearance: Fairly Groomed  Eye Contact:  Fair  Speech:  Slow  Volume:  Decreased  Mood:  Euthymic  Affect:  Flat  Thought Process:  Coherent  Orientation:  Full (Time, Place, and Person)  Thought Content:  Logical  Suicidal Thoughts:  No  Homicidal Thoughts:  No  Memory:  Immediate;   Fair Recent;   Fair  Judgement:  Intact  Insight:  Fair  Psychomotor Activity:  Normal  Concentration:  Concentration: Fair and Attention Span: Fair  Recall:  [employer] of Knowledge:  Fair  Language:  Good  Akathisia:  No  Handed:  Right  AIMS (if indicated):     Assets:  Desire for Improvement Leisure Time Physical Health  ADL's:  Intact  Cognition:  WNL  Sleep:  Number of Hours: 5.5     Treatment Plan Summary: Daily contact with patient to assess and evaluate symptoms and progress in treatment and Medication management   Continue inpatient hospitalization.  Continue Invega 9 mg PO QHS for psychosis Continue Prozac 40 mg PO daily for mood Continue Neurontin 300 mg PO TID for anxiety Continue trazodone 25 mg PO QHS PRN insomnia Continue Haldol/Ativan PO/IM PRN agitation Continue Cogentin 1 mg PO BID PRN tremors/EPS  Patient will participate in the therapeutic group milieu.  Discharge disposition in progress.   Connye Burkitt, NP 11/15/2019, 10:18 AM

## 2019-11-15 NOTE — Progress Notes (Signed)
   11/15/19 2000  Psych Admission Type (Psych Patients Only)  Admission Status Voluntary  Psychosocial Assessment  Patient Complaints Anxiety  Eye Contact Brief  Facial Expression Flat  Affect Appropriate to circumstance  Speech Logical/coherent  Interaction Minimal  Motor Activity Other (Comment) (WDL)  Appearance/Hygiene Unremarkable  Behavior Characteristics Cooperative  Mood Depressed  Thought Process  Coherency WDL  Content WDL  Delusions Paranoid  Perception WDL  Hallucination Auditory  Judgment Poor  Confusion None  Danger to Self  Current suicidal ideation? Denies  Self-Injurious Behavior No self-injurious ideation or behavior indicators observed or expressed   Agreement Not to Harm Self Yes  Description of Agreement verbal  Danger to Others  Danger to Others None reported or observed

## 2019-11-16 NOTE — Progress Notes (Signed)
   11/16/19 2200  Psych Admission Type (Psych Patients Only)  Admission Status Voluntary  Psychosocial Assessment  Patient Complaints Anxiety  Eye Contact Brief  Facial Expression Flat  Affect Appropriate to circumstance  Speech Logical/coherent  Interaction Minimal  Motor Activity Other (Comment) (WDL)  Appearance/Hygiene Unremarkable  Behavior Characteristics Cooperative  Mood Anxious  Thought Process  Coherency WDL  Content WDL  Delusions Paranoid  Perception WDL  Hallucination Auditory  Judgment Poor  Confusion None  Danger to Self  Current suicidal ideation? Denies  Self-Injurious Behavior No self-injurious ideation or behavior indicators observed or expressed   Agreement Not to Harm Self Yes  Description of Agreement verbal  Danger to Others  Danger to Others None reported or observed

## 2019-11-16 NOTE — Progress Notes (Signed)
D: Patient presents with a flat, depressed affect. Patient  Rated anxiety 5/10 and denied depression.  Patient was isolative in her room, but did participate in pet therapy in the afternoon. In the afternoon patient complained of anxiety 9/10 and complained of "stiffness" Patient stated that the doctor told her stiffness was a side-effect to her medication. Patient had slight hand tremors. Patient was given 1 mg of ativan for the anxiety and 1 mg of cogentin for EPS symptoms.  A:  Patient took scheduled medicine.  Support and encouragement provided Routine safety checks conducted every 15 minutes. Patient  Informed to notify staff with any concerns.   R:  Patient reported stiffness as a drug reaction.  Patient contracts for safety.  Patient compliant with medication and treatment plan. Patient cooperative and calm.   Safety maintained.

## 2019-11-16 NOTE — BHH Counselor (Signed)
CSW attempted to reach [employer] supervisor Earlean Polka [phone]) to inquire about group home availability. CSW left a detailed HIPAA compliant voicemail requesting a callback.  [name], MSW, LCSW-A Clinical Social Worker [facility]

## 2019-11-16 NOTE — Progress Notes (Signed)
Supervisor spoke to Julia [name] who is willing to accept pt to her group home pending a satisfactory payment arrangement.  Supervisor filled out the LOG request form and faxed to Cameron, who will have the LOG letter completed and sent to Ms. Wood. [name], MSW, LCSW [facility] Supervisor 11/16/2019 9:09 AM

## 2019-11-16 NOTE — Progress Notes (Signed)
[facility] MD Progress Note  11/16/2019 11:25 AM [name]  MRN:  [id number] Subjective:  "I'm fine."  Ms. Munzer found lying in bed. She remains at baseline. She continues to present with flat affect and reports stable mood. She does report intermittent AH of voices. She denies CAH and states the AH are not denigrating. She denies SI/HI. No delusional thought content expressed. She specifically denies delusions of thought insertion. She shows no signs of responding to internal stimuli. No agitated or disruptive behaviors on the unit. Placement is pending group home financing.  From admission H&P:[name] with a past psychiatric history significant for schizophrenia, unspecified depression, methamphetamine use disorder in partial remission who was admitted on 10/16/2019 after she had left a group home, and developed suicidal ideation, auditory hallucinations and paranoia  Principal Problem: Schizophrenia, paranoid ([place]) Diagnosis: Principal Problem:   Schizophrenia, paranoid ([place]) Active Problems:   Depressed mood   Psychosis ([place])   MDD (major depressive disorder)  Total Time spent with patient: 15 minutes  Past Psychiatric History: See admission H&P  Past Medical History:  Past Medical History:  Diagnosis Date  . Burning with urination 05/03/2015  . Contraceptive management 05/03/2015  . Depression   . Dysmenorrhea 12/30/2013  . Heroin addiction ([place])   . Menorrhagia 12/30/2013  . Menstrual extraction 12/30/2013  . Migraines   . Social anxiety disorder 09/25/2016  . Suicidal ideations   . Vaginal odor 05/03/2015    Past Surgical History:  Procedure Laterality Date  . NO PAST SURGERIES     Family History:  Family History  Problem Relation Age of Onset  . Depression Mother   . Hypertension Father   . Hyperlipidemia Father   . Cancer Paternal Grandmother        breast, uterine  . Cirrhosis Paternal Grandfather        due to alcohol   Family Psychiatric   History: See admission H&P Social History:  Social History   Substance and Sexual Activity  Alcohol Use Yes   Comment: BAC was clear     Social History   Substance and Sexual Activity  Drug Use Yes  . Types: Marijuana, Oxycodone   Comment: reports oxycodone and marijuana    Social History   Socioeconomic History  . Marital status: Single    Spouse name: Not on file  . Number of children: Not on file  . Years of education: Not on file  . Highest education level: Not on file  Occupational History  . Occupation: Unemployed  Tobacco Use  . Smoking status: Current Every Day Smoker    Packs/day: 0.50    Types: Cigarettes  . Smokeless tobacco: Never Used  . Tobacco comment: Not ready to quit  Substance and Sexual Activity  . Alcohol use: Yes    Comment: BAC was clear  . Drug use: Yes    Types: Marijuana, Oxycodone    Comment: reports oxycodone and marijuana  . Sexual activity: Yes    Birth control/protection: Implant  Other Topics Concern  . Not on file  Social History Narrative   06/23/2019:  Pt stated that she is homeless, that she is a high school graduate, and that she is unemployed and not followed by any outpatient provider.      Lives with Dad. Mom has LGD, lives in [place]. 11th grader. Dog.   Social Determinants of Health   Financial Resource Strain:   . Difficulty of Paying Living Expenses:   Food Insecurity:   .  Worried About Charity fundraiser in the Last Year:   . Arboriculturist in the Last Year:   Transportation Needs:   . Film/video editor (Medical):   [name] Lack of Transportation (Non-Medical):   Physical Activity:   . Days of Exercise per Week:   . Minutes of Exercise per Session:   Stress:   . Feeling of Stress :   Social Connections:   . Frequency of Communication with Friends and Family:   . Frequency of Social Gatherings with Friends and Family:   . Attends Religious Services:   . Active Member of Clubs or Organizations:   . Attends Theatre manager Meetings:   [name] Marital Status:    Additional Social History:    Pain Medications: Please see MAR Prescriptions: Please see MAR, prozac and cogentin per pt Over the Counter: Please see MAR History of alcohol / drug use?: Yes Longest period of sobriety (when/how long): Unknown Negative Consequences of Use: Legal Name of Substance 1: alcohol 1 - Frequency: UTA 1 - Duration: UTA- pt denies 1 - Last Use / Amount: UTA                  Sleep: Good  Appetite:  Good  Current Medications: Current Facility-Administered Medications  Medication Dose Route Frequency Provider Last Rate Last Admin  . acetaminophen (TYLENOL) tablet 650 mg  650 mg Oral Q6H PRN Emmaline Kluver, FNP      . alum & mag hydroxide-simeth (MAALOX/MYLANTA) 200-200-20 MG/5ML suspension 30 mL  30 mL Oral Q4H PRN Emmaline Kluver, FNP      . benztropine (COGENTIN) tablet 1 mg  1 mg Oral BID PRN Cobos, Myer Peer, MD   1 mg at 10/27/19 1644  . FLUoxetine (PROZAC) capsule 40 mg  40 mg Oral Daily Cobos, Myer Peer, MD   40 mg at 11/16/19 0815  . gabapentin (NEURONTIN) capsule 300 mg  300 mg Oral TID Lindell Spar I, NP   300 mg at 11/16/19 0815  . haloperidol (HALDOL) tablet 5 mg  5 mg Oral Q6H PRN Cobos, Myer Peer, MD   5 mg at 11/14/19 1002   Or  . haloperidol lactate (HALDOL) injection 5 mg  5 mg Intramuscular Q6H PRN Cobos, Myer Peer, MD      . LORazepam (ATIVAN) tablet 1 mg  1 mg Oral Q6H PRN Lindell Spar I, NP   1 mg at 11/15/19 1346   Or  . LORazepam (ATIVAN) injection 1 mg  1 mg Intramuscular Q6H PRN Nwoko, Agnes I, NP      . magnesium hydroxide (MILK OF MAGNESIA) suspension 30 mL  30 mL Oral Daily PRN Emmaline Kluver, FNP      . nicotine (NICODERM CQ - dosed in mg/24 hr) patch 7 mg  7 mg Transdermal Daily Cobos, Myer Peer, MD   7 mg at 11/16/19 0905  . paliperidone (INVEGA) 24 hr tablet 9 mg  9 mg Oral QHS Cobos, Myer Peer, MD   9 mg at 11/15/19 2150  . traZODone (DESYREL) tablet 25 mg  25 mg Oral QHS PRN  Cobos, Myer Peer, MD        Lab Results: No results found for this or any previous visit (from the past 48 hour(s)).  Blood Alcohol level:  Lab Results  Component Value Date   [facility] <10 10/16/2019   ETH <10 [id number]    Metabolic Disorder Labs: Lab Results  Component Value Date  HGBA1C 5.4 10/16/2019   MPG 108.28 10/16/2019   MPG 102.54 05/17/2019   Lab Results  Component Value Date   PROLACTIN 113.0 (H) 11/11/2019   PROLACTIN 152.0 (H) 10/16/2019   Lab Results  Component Value Date   CHOL 138 10/16/2019   TRIG 60 10/16/2019   HDL 36 (L) 10/16/2019   CHOLHDL 3.8 10/16/2019   VLDL 12 10/16/2019   LDLCALC 90 10/16/2019   LDLCALC 96 05/17/2019    Physical Findings: AIMS: Facial and Oral Movements Muscles of Facial Expression: None, normal Lips and Perioral Area: None, normal Jaw: None, normal Tongue: None, normal,Extremity Movements Upper (arms, wrists, hands, fingers): None, normal Lower (legs, knees, ankles, toes): None, normal, Trunk Movements Neck, shoulders, hips: None, normal, Overall Severity Severity of abnormal movements (highest score from questions above): None, normal Incapacitation due to abnormal movements: None, normal Patient's awareness of abnormal movements (rate only patient's report): No Awareness, Dental Status Current problems with teeth and/or dentures?: No Does patient usually wear dentures?: No  CIWA:  CIWA-Ar Total: 1 COWS:  COWS Total Score: 2  Musculoskeletal: Strength & Muscle Tone: within normal limits Gait & Station: normal Patient leans: N/A  Psychiatric Specialty Exam: Physical Exam  Nursing note and vitals reviewed. Constitutional: She is oriented to person, place, and time. She appears well-developed and well-nourished.  Respiratory: Effort normal.  Musculoskeletal:        General: Normal range of motion.  Neurological: She is alert and oriented to person, place, and time.    Review of Systems  Constitutional:  Negative.   Respiratory: Negative for cough and shortness of breath.   Psychiatric/Behavioral: Positive for hallucinations. Negative for agitation, behavioral problems, confusion, dysphoric mood, self-injury, sleep disturbance and suicidal ideas. The patient is not nervous/anxious and is not hyperactive.     Blood pressure 113/75, pulse (!) 115, temperature (!) 97.5 F (36.4 C), temperature source Oral, resp. rate 18, height 5\' 3"  (1.6 m), weight 67.8 kg, SpO2 98 %.Body mass index is 26.48 kg/m.  General Appearance: Disheveled  Eye Contact:  Good  Speech:  Slow  Volume:  Normal  Mood:  Euthymic  Affect:  Flat  Thought Process:  Coherent  Orientation:  Full (Time, Place, and Person)  Thought Content:  Logical  Suicidal Thoughts:  No  Homicidal Thoughts:  No  Memory:  Immediate;   Fair Recent;   Fair  Judgement:  Intact  Insight:  Fair  Psychomotor Activity:  Decreased  Concentration:  Concentration: Fair and Attention Span: Fair  Recall:  [employer] of Knowledge:  Fair  Language:  Good  Akathisia:  No  Handed:  Right  AIMS (if indicated):     Assets:  Leisure Time Physical Health Resilience  ADL's:  Intact  Cognition:  WNL  Sleep:  Number of Hours: 5.5     Treatment Plan Summary: Daily contact with patient to assess and evaluate symptoms and progress in treatment and Medication management   Continue inpatient hospitalization.  Continue Invega 9 mg PO QHS for psychosis Continue Prozac 40 mg PO daily for mood Continue Neurontin 300 mg PO TID for anxiety Continue trazodone 25 mg PO QHS PRN insomnia Continue Haldol/Ativan PO/IM PRN agitation Continue Cogentin 1 mg PO BID PRN tremors/EPS  Patient will participate in the therapeutic group milieu.  Discharge disposition in progress.   Connye Burkitt, NP 11/16/2019, 11:25 AM

## 2019-11-16 NOTE — Progress Notes (Signed)
Recreation Therapy Notes  Animal-Assisted Activity (AAA) Program Checklist/Progress Notes Patient Eligibility Criteria Checklist & Daily Group note for Rec Tx Intervention  Date: 5.4.21 Time: 1430 Location: [facility]   AAA/T Program Assumption of Risk Form signed by Teacher, music or Parent Legal Guardian  YES  Patient is free of allergies or sever asthma  YES   Patient reports no fear of animals  YES   Patient reports no history of cruelty to animals  YES   Patient understands his/her participation is voluntary YES   Patient washes hands before animal contact  YES  Patient washes hands after animal contact  YES   Behavioral Response: Engaged  Education: Contractor, Appropriate Animal Interaction   Education Outcome: Acknowledges understanding/In group clarification offered/Needs additional education.   Clinical Observations/Feedback:  Pt attended and participated in group activity.     [name], LRT/CTRS   [name] A 11/16/2019 3:02 PM

## 2019-11-16 NOTE — Progress Notes (Signed)
Supervisor spoke to [employer].  She has not checked her fax to see if she received the LOG and will do so today.  [facility] Kidney has a second resident who is there under a similar, non-traditional payment plan and that one is not going so well.  She cannot take pt with just a promise to pay and would need things set up prior to pt moving in.  She can provide a W-9. [name], MSW, LCSW Advanced Care Supervisor 11/16/2019 9:07 AM

## 2019-11-16 NOTE — Progress Notes (Signed)
Psychoeducational Group Note  Date:  11/16/2019 Time:  2107  Group Topic/Focus:  Wrap-Up Group:   The focus of this group is to help patients review their daily goal of treatment and discuss progress on daily workbooks.  Participation Level: Did Not Attend  Participation Quality:  Not Applicable  Affect:  Not Applicable  Cognitive:  Not Applicable  Insight:  Not Applicable  Engagement in Group: Not Applicable  Additional Comments:  The patient did not attend group this evening.   [place], [place] 11/16/2019, 9:07 PM

## 2019-11-17 MED ORDER — DIPHENHYDRAMINE HCL 25 MG PO CAPS
25.0000 mg | ORAL_CAPSULE | Freq: Once | ORAL | Status: AC | PRN
  Administered 2019-11-20: 09:00:00 25 mg via ORAL
  Filled 2019-11-17: qty 1

## 2019-11-17 MED ORDER — FLUOXETINE HCL 20 MG PO CAPS
60.0000 mg | ORAL_CAPSULE | Freq: Every day | ORAL | Status: DC
  Administered 2019-11-18 – 2019-11-26 (×9): 60 mg via ORAL
  Filled 2019-11-17 (×3): qty 3
  Filled 2019-11-17: qty 42
  Filled 2019-11-17 (×3): qty 3
  Filled 2019-11-17: qty 63
  Filled 2019-11-17 (×3): qty 3
  Filled 2019-11-17: qty 63
  Filled 2019-11-17: qty 3

## 2019-11-17 MED ORDER — GABAPENTIN 400 MG PO CAPS
400.0000 mg | ORAL_CAPSULE | Freq: Three times a day (TID) | ORAL | Status: DC
  Administered 2019-11-17 – 2019-11-26 (×27): 400 mg via ORAL
  Filled 2019-11-17 (×3): qty 1
  Filled 2019-11-17: qty 63
  Filled 2019-11-17 (×2): qty 1
  Filled 2019-11-17 (×2): qty 63
  Filled 2019-11-17: qty 1
  Filled 2019-11-17: qty 42
  Filled 2019-11-17: qty 63
  Filled 2019-11-17 (×3): qty 1
  Filled 2019-11-17: qty 63
  Filled 2019-11-17 (×7): qty 1
  Filled 2019-11-17: qty 63
  Filled 2019-11-17: qty 42
  Filled 2019-11-17 (×14): qty 1
  Filled 2019-11-17: qty 42
  Filled 2019-11-17: qty 1

## 2019-11-17 MED ORDER — DIPHENHYDRAMINE HCL 50 MG/ML IJ SOLN: 25.0000 mg | Freq: Once | INTRAMUSCULAR | Status: AC | PRN

## 2019-11-17 NOTE — Progress Notes (Signed)
Pt presents with anxiety.  Pt denies SI/HI.  RN administered pt's medications and provided encouragement.  RN observed pt interacting with out pt's on the unit. No immediate concerns are identified at this time.  RN will continue to monitor and provide assistance as needed.

## 2019-11-17 NOTE — BHH Group Notes (Signed)
11/17/2019 8:45am  Type of Group and Topic: Psychoeducational Group: Discharge Planning  Participation Level: Active  Description of Group Discharge planning group reviews patient's anticipated discharge plans and assists patients to anticipate and address any barriers to wellness/recovery in the community. Suicide prevention education is reviewed with patients in group. Therapeutic Goals 1. Patients will state their anticipated discharge plan and mental health aftercare 2. Patients will identify potential barriers to wellness in the community setting 3. Patients will engage in problem solving, solution focused discussion of ways to anticipate and address barriers to wellness/recovery   Summary of Patient Progress Plan for Discharge/Comments: Julia [name] was engaged throughout the group session. Julia [name] reports her main concerns regarding discharge is not having anywhere to live, if she is not accepted to a group home facility. Julia [name] states that she is ready to discharge and that she would like to see her boyfriend "for a few days". Julia [name] reports she wants to discharge from the hospital "for a few days" and then come back until her group home bed is ready. CSW explained to Julia [name] that her plans would not be an appropriate discharge from the hospital, in addition to her not being able to "just come back" after being discharged. Julia [name] expressed understanding, however she was adamant about CSW sharing this information with the attending psychiatrist.   Transportation Means: To be determined; CSW will assist with transporting the patient to her appropriate disposition, once discharge has been determined.    Supports: Patient reports her boyfriend and her father are her current supports.    Therapeutic Modalities: Motivational Interviewing     Julia [name], MSW, [place] Worker [facility]  Phone: [phone] 11/17/2019 2:12 PM

## 2019-11-17 NOTE — Progress Notes (Signed)
[facility] MD Progress Note  11/17/2019 10:58 AM Julia [name]  MRN:  [id number]  Subjective: Julia [name] reports, "I'm doing well. The only complain I have today is, I have stiffness to my whole body. But, I have taken some Cogentin for it already".  Objective: [name] with a past psychiatric history significant for schizophrenia, unspecified depression, methamphetamine use disorder in partial remission who was admitted on 10/16/2019 after she had left a group home, and developed suicidal ideation, auditory hallucinations and paranoia. Julia [name] is seen, chart reviewed. The chart findings discussed with the treatment team. She presents alert, oriented & aware of situation. She is visible on the unit, attending group sessions. She says she is doing well today, except that her whole body feel stiff. However, she says she has taken some Cogentin for it already.  Reports having some mild anxiety, rates anxiety #2 in the scale of (1-10). Ten being the worst anxiety & one being the least anxiety. She denies any suicidal ideations. She still remains somewhat guarded with a flat/restricted affect. She is making fairly good eye contact. Julia [name] at this time remains at or near her probable baseline.  She & the attending psychiatrist/SW had previously discussed the current complications with the social issues, about her housing as well as her guardianship issues.  Her vital signs remain stable, she is afebrile.  She is sleeping well & her appetite remains good. Julia [name] at this time does not appear to be responding to any internal stimuli. She is in agreement to continue her current plan of care as already in progress. Denies any side effects. Resting well. See Tx plan (waiting for group home placement at this time).  Principal Problem: Schizophrenia, paranoid ([place])  Diagnosis: Principal Problem:   Schizophrenia, paranoid ([place]) Active Problems:   Depressed mood   Psychosis ([place])   MDD (major depressive  disorder)  Total Time spent with patient: 15 minutes  Past Psychiatric History: See admission H&P  Past Medical History:  Past Medical History:  Diagnosis Date  . Burning with urination 05/03/2015  . Contraceptive management 05/03/2015  . Depression   . Dysmenorrhea 12/30/2013  . Heroin addiction ([place])   . Menorrhagia 12/30/2013  . Menstrual extraction 12/30/2013  . Migraines   . Social anxiety disorder 09/25/2016  . Suicidal ideations   . Vaginal odor 05/03/2015    Past Surgical History:  Procedure Laterality Date  . NO PAST SURGERIES     Family History:  Family History  Problem Relation Age of Onset  . Depression Mother   . Hypertension Father   . Hyperlipidemia Father   . Cancer Paternal Grandmother        breast, uterine  . Cirrhosis Paternal Grandfather        due to alcohol   Family Psychiatric  History: See admission H&P  Social History:  Social History   Substance and Sexual Activity  Alcohol Use Yes   Comment: BAC was clear     Social History   Substance and Sexual Activity  Drug Use Yes  . Types: Marijuana, Oxycodone   Comment: reports oxycodone and marijuana    Social History   Socioeconomic History  . Marital status: Single    Spouse name: Not on file  . Number of children: Not on file  . Years of education: Not on file  . Highest education level: Not on file  Occupational History  . Occupation: Unemployed  Tobacco Use  . Smoking status: Current Every Day Smoker  Packs/day: 0.50    Types: Cigarettes  . Smokeless tobacco: Never Used  . Tobacco comment: Not ready to quit  Substance and Sexual Activity  . Alcohol use: Yes    Comment: BAC was clear  . Drug use: Yes    Types: Marijuana, Oxycodone    Comment: reports oxycodone and marijuana  . Sexual activity: Yes    Birth control/protection: Implant  Other Topics Concern  . Not on file  Social History Narrative   06/23/2019:  Pt stated that she is homeless, that she is a high school  graduate, and that she is unemployed and not followed by any outpatient provider.      Lives with Dad. Mom has LGD, lives in [place]. 11th grader. Dog.   Social Determinants of Health   Financial Resource Strain:   . Difficulty of Paying Living Expenses:   Food Insecurity:   . Worried About Charity fundraiser in the Last Year:   . Arboriculturist in the Last Year:   Transportation Needs:   . Film/video editor (Medical):   [name] Lack of Transportation (Non-Medical):   Physical Activity:   . Days of Exercise per Week:   . Minutes of Exercise per Session:   Stress:   . Feeling of Stress :   Social Connections:   . Frequency of Communication with Friends and Family:   . Frequency of Social Gatherings with Friends and Family:   . Attends Religious Services:   . Active Member of Clubs or Organizations:   . Attends Archivist Meetings:   [name] Marital Status:    Additional Social History:  Pain Medications: Please see MAR Prescriptions: Please see MAR, prozac and cogentin per pt Over the Counter: Please see MAR History of alcohol / drug use?: Yes Longest period of sobriety (when/how long): Unknown Negative Consequences of Use: Legal Name of Substance 1: alcohol 1 - Frequency: UTA 1 - Duration: UTA- pt denies 1 - Last Use / Amount: UTA  Sleep: Good  Appetite:  Good  Current Medications: Current Facility-Administered Medications  Medication Dose Route Frequency Provider Last Rate Last Admin  . acetaminophen (TYLENOL) tablet 650 mg  650 mg Oral Q6H PRN Emmaline Kluver, FNP      . alum & mag hydroxide-simeth (MAALOX/MYLANTA) 200-200-20 MG/5ML suspension 30 mL  30 mL Oral Q4H PRN Emmaline Kluver, FNP      . benztropine (COGENTIN) tablet 1 mg  1 mg Oral BID PRN Cobos, Myer Peer, MD   1 mg at 11/17/19 0748  . FLUoxetine (PROZAC) capsule 40 mg  40 mg Oral Daily Cobos, Myer Peer, MD   40 mg at 11/17/19 0746  . gabapentin (NEURONTIN) capsule 300 mg  300 mg Oral TID Lindell Spar I, NP    300 mg at 11/17/19 0746  . haloperidol (HALDOL) tablet 5 mg  5 mg Oral Q6H PRN Cobos, Myer Peer, MD   5 mg at 11/14/19 1002   Or  . haloperidol lactate (HALDOL) injection 5 mg  5 mg Intramuscular Q6H PRN Cobos, Myer Peer, MD      . LORazepam (ATIVAN) tablet 1 mg  1 mg Oral Q6H PRN Lindell Spar I, NP   1 mg at 11/16/19 1503   Or  . LORazepam (ATIVAN) injection 1 mg  1 mg Intramuscular Q6H PRN Rada Zegers I, NP      . magnesium hydroxide (MILK OF MAGNESIA) suspension 30 mL  30 mL Oral Daily PRN [name]  L, FNP      . nicotine (NICODERM CQ - dosed in mg/24 hr) patch 7 mg  7 mg Transdermal Daily Cobos, Myer Peer, MD   7 mg at 11/17/19 0751  . paliperidone (INVEGA) 24 hr tablet 9 mg  9 mg Oral QHS Cobos, Myer Peer, MD   9 mg at 11/16/19 2208  . traZODone (DESYREL) tablet 25 mg  25 mg Oral QHS PRN Cobos, Myer Peer, MD       Lab Results:  No results found for this or any previous visit (from the past 48 hour(s)).  Blood Alcohol level:  Lab Results  Component Value Date   ETH <10 10/16/2019   ETH <10 123456   Metabolic Disorder Labs: Lab Results  Component Value Date   HGBA1C 5.4 10/16/2019   MPG 108.28 10/16/2019   MPG 102.54 05/17/2019   Lab Results  Component Value Date   PROLACTIN 113.0 (H) 11/11/2019   PROLACTIN 152.0 (H) 10/16/2019   Lab Results  Component Value Date   CHOL 138 10/16/2019   TRIG 60 10/16/2019   HDL 36 (L) 10/16/2019   CHOLHDL 3.8 10/16/2019   VLDL 12 10/16/2019   LDLCALC 90 10/16/2019   LDLCALC 96 05/17/2019   Physical Findings: AIMS: Facial and Oral Movements Muscles of Facial Expression: None, normal Lips and Perioral Area: None, normal Jaw: None, normal Tongue: None, normal,Extremity Movements Upper (arms, wrists, hands, fingers): None, normal Lower (legs, knees, ankles, toes): None, normal, Trunk Movements Neck, shoulders, hips: None, normal, Overall Severity Severity of abnormal movements (highest score from questions above): None,  normal Incapacitation due to abnormal movements: None, normal Patient's awareness of abnormal movements (rate only patient's report): No Awareness, Dental Status Current problems with teeth and/or dentures?: No Does patient usually wear dentures?: No  CIWA:  CIWA-Ar Total: 1 COWS:  COWS Total Score: 2  Musculoskeletal: Strength & Muscle Tone: within normal limits Gait & Station: normal Patient leans: N/A  Psychiatric Specialty Exam: Physical Exam  Nursing note and vitals reviewed. Constitutional: She is oriented to person, place, and time. She appears well-developed and well-nourished.  HENT:  Head: Normocephalic and atraumatic.  Cardiovascular:  Elevated pulse rate: 124  Respiratory: Effort normal.  Genitourinary:    Genitourinary Comments: Deferred   Musculoskeletal:        General: Normal range of motion.     Cervical back: Normal range of motion.  Neurological: She is alert and oriented to person, place, and time.  Skin: Skin is warm.    Review of Systems  Constitutional: Negative for chills, diaphoresis and fever.  HENT: Negative for congestion, rhinorrhea, sneezing and sore throat.   Eyes: Negative for discharge.  Respiratory: Negative for cough, chest tightness, shortness of breath and wheezing.   Cardiovascular: Negative for chest pain and palpitations.  Gastrointestinal: Negative for diarrhea, nausea and vomiting.  Genitourinary: Negative for difficulty urinating.  Musculoskeletal: Negative.   Allergic/Immunologic: Negative for environmental allergies and food allergies.       Allergies: Abilify, Sertraline (Zoloft)  Neurological: Negative for dizziness, tremors, seizures, syncope, light-headedness and headaches.  Psychiatric/Behavioral: Positive for dysphoric mood ("Improving"). Negative for agitation, behavioral problems, confusion, decreased concentration, hallucinations, self-injury, sleep disturbance and suicidal ideas. The patient is nervous/anxious. The  patient is not hyperactive.     Blood pressure (!) 89/48, pulse (!) 124, temperature 98.6 F (37 C), temperature source Oral, resp. rate 18, height 5\' 3"  (1.6 m), weight 67.8 kg, SpO2 98 %.Body mass index is 26.48 kg/m.  General  Appearance: Casual  Eye Contact:  Fair  Speech:  Normal Rate  Volume:  Decreased  Mood:  Anxious, but says mood is improving, rates anxiety at #2  Affect:  Flat  Thought Process:  Coherent and Descriptions of Associations: Circumstantial  Orientation:  Full (Time, Place, and Person)  Thought Content:  Hallucinations: Auditory and Paranoid Ideation  Suicidal Thoughts:  No  Homicidal Thoughts:  No  Memory:  Immediate;   Fair Recent;   Fair Remote;   Fair  Judgement:  Intact  Insight:  Fair  Psychomotor Activity:  Decreased  Concentration:  Concentration: Fair and Attention Span: Fair  Recall:  [employer] of Knowledge:  Fair  Language:  Good  Akathisia:  Negative  Handed:  Right  AIMS (if indicated):     Assets:  Desire for Improvement Resilience  ADL's:  Intact  Cognition:  WNL  Sleep:  Number of Hours: 6.75   Treatment Plan Summary: Daily contact with patient to assess and evaluate symptoms and progress in treatment, Medication management and Plan : Patient is seen and examined.  [name] with the above-stated past psychiatric history who is seen in follow-up.   - Continue inpatient hospitalization. - Will continue today 11/17/2019 plan as below except where it is noted.  Diagnosis:  #1 psychosis: Schizoaffective disorder versus schizophrenia versus substance-induced psychotic disorder,  #2 methamphetamine dependence in partial remission,  #3 unspecified depression,  #4 urinary tract infection (resolved)  Patient is seen for follow-up care assessment.  She is basically at her baseline.  She denies any auditory hallucinations, delusions/paranoia. Denies suicidal or homicidal ideations.  She denies any side effects to her  current medications.  Repeated urinalysis showed essentially normal urine with no evidence of infection.  Social issues continue with placement and guardianship.  No change in her medications today.  1.  Continue Cogentin 1 mg p.o. twice daily as needed tremors and EPS. 2.  Continue fluoxetine 40 mg p.o. daily for depression and anxiety. 3.  Continue gabapentin 300 mg p.o. 3 times daily for anxiety and mood stability. 4.  Continue Haldol as needed & lorazepam as needed for agitation. 5.  Continue paliperidone 9 mg p.o. daily for psychosis. 6.  Continue trazodone 50 mg p.o. nightly as needed insomnia. 7.  Disposition planning-in progress. 8.  Patient continues to attend group sessions.  Lindell Spar, NP, PMHNP, FNP-BC  11/17/2019, 10:58 AMPatient ID: Julia [name], female   DOB: Feb 01, 2001, 19 y.o.MRN: [id number] Patient ID: Julia [name], female   DOB: October 23, 2000, 19 y.o.   MRN: [id number]

## 2019-11-17 NOTE — Progress Notes (Signed)
Pt stated she was feeling better. Pt visible on the unit this evening.    11/17/19 2000  Psych Admission Type (Psych Patients Only)  Admission Status Voluntary  Psychosocial Assessment  Patient Complaints Anxiety  Eye Contact Brief  Facial Expression Flat  Affect Appropriate to circumstance  Speech Logical/coherent  Interaction Minimal  Motor Activity Other (Comment) (WDL)  Appearance/Hygiene Unremarkable  Behavior Characteristics Cooperative  Mood Anxious  Thought Process  Coherency WDL  Content WDL  Delusions Paranoid  Perception WDL  Hallucination Auditory  Judgment Poor  Confusion None  Danger to Self  Current suicidal ideation? Denies  Self-Injurious Behavior No self-injurious ideation or behavior indicators observed or expressed   Agreement Not to Harm Self Yes  Description of Agreement verbal  Danger to Others  Danger to Others None reported or observed

## 2019-11-17 NOTE — Progress Notes (Signed)
Psychoeducational Group Note  Date:  11/17/2019 Time:  2030  Group Topic/Focus:  wrap up group  Participation Level: Did Not Attend  Participation Quality:  Not Applicable  Affect:  Not Applicable  Cognitive:  Not Applicable  Insight:  Not Applicable  Engagement in Group: Not Applicable  Additional Comments:  Pt did join [place] for the beginning of group but left while the first person was sharing and didn't return.   Julia [name] S 11/17/2019, 9:02 PM

## 2019-11-17 NOTE — Progress Notes (Signed)
Recreation Therapy Notes  Date:  5.5.21 Time: 0930 Location: 300 Hall Group Room  Group Topic: Stress Management  Goal Area(s) Addresses:  Patient will identify positive stress management techniques. Patient will identify benefits of using stress management post d/c.  Intervention: Stress Management  Activity :  Progressive Muscle Relaxation.  LRT read a script that guided patients in tensing and relaxing each muscle group individually.  Patients were to listen and follow along as script was read to engage in activity.  Education:  Stress Management, Discharge Planning.   Education Outcome: Acknowledges Education  Clinical Observations/Feedback: Pt did not attend activity.    [name], LRT/CTRS    [name] A 11/17/2019 11:57 AM

## 2019-11-17 NOTE — BHH Counselor (Signed)
CSW attempted to reach [employer] supervisor Earlean Polka [phone]) to inquire about group home availability. CSW left a detailed HIPAA compliant voicemail requesting a callback.  CSW attempted to reach this group home yesterday (05/04) and left a message.  [name], MSW, LCSW-A Clinical Social Worker [facility]

## 2019-11-17 NOTE — BHH Counselor (Signed)
Patient approached CSW for updates regarding placement, she has expressed anxiety regarding remaining inpatient for a longer period of time. Patient inquired if she would be able to leave temporarily to smoke a cigarette and see her boyfriend, CSW explained that this would not be possible. Patient voiced understanding.  [name], MSW, LCSW-A Clinical Social Worker [facility]

## 2019-11-17 NOTE — Progress Notes (Signed)
Pt became angry and agitated, began yelling and screaming demanding discharge pacing the hallways stating she wants a cigarette and doesn't want nicotine replacement anymore. Some verbal excalation techniques used by staff helped calm pt down some, and pt was also offered and was given PRN medication for agitation, see MAR. Will continue to monitor pt per Q15 minute face checks and monitor for safety and progress.

## 2019-11-18 MED ORDER — PALIPERIDONE ER 6 MG PO TB24
12.0000 mg | ORAL_TABLET | Freq: Every day | ORAL | Status: DC
  Administered 2019-11-18 – 2019-11-25 (×8): 12 mg via ORAL
  Filled 2019-11-18: qty 42
  Filled 2019-11-18 (×3): qty 2
  Filled 2019-11-18: qty 42
  Filled 2019-11-18: qty 28
  Filled 2019-11-18 (×6): qty 2

## 2019-11-18 MED ORDER — PALIPERIDONE ER 3 MG PO TB24
3.0000 mg | ORAL_TABLET | Freq: Once | ORAL | Status: AC
  Administered 2019-11-18: 09:00:00 3 mg via ORAL
  Filled 2019-11-18 (×2): qty 1

## 2019-11-18 NOTE — Progress Notes (Signed)
0930: supervisor spoke with Lucy [place], [facility], and she was more willing to consider the LOG, agreed to have her QP contact supervisor for W9. 1300: supervisor spoke to Tillman Abide at [facility], [phone], and she agreed to forward W9, which she did. 1430: Supervisor spoke to [employer] in accounts payable at [employer].  [phone].  He explained that if we can send [place] today, he can get the account set up before hand and turn around a check within a weeks time once the pt is place. Supervisor forwarded [place] directly to [place]. [name], MSW, LCSW Advanced Care Supervisor 11/18/2019 1:11 PM

## 2019-11-18 NOTE — Progress Notes (Signed)
Pt reported that she felt anxious this morning.  Pt responded well to prn antianxiety medication.  Pt did not have any emotional outbursts so far this shift.  RN observed pt interacting with peers in the dayroom.  RN administered medications as prescribed and provided reassurance.  No major concerns identified at this time.

## 2019-11-18 NOTE — BHH Counselor (Signed)
Julia [name] has expressed anxiety regarding the length of her admission, she has asked several times if she is able to leave and "come back" to [facility] in order to smoke a cigarette and see her boyfriend. Patient is aware that this is not an option.   Patient asked if she could discharge soon and go to a shelter, she states she can call her boyfriend who lives in [place] and get a ride.  Patient is also curious about updates regarding her application for SSI and SSI Survivor's Benefits. CSW and patient will meet tomorrow (05/07) and call the SS Administration again for a status update.  [name], MSW, LCSW-A Clinical Social Worker [facility]

## 2019-11-18 NOTE — Progress Notes (Addendum)
0900: Supervisor spoke to [employer] who today reports she is partnering with a new individual in running her group home and he is "unwilling to use a W9."  Supervisor explained the progress from yesterday and that she could get paid in less than one week, but [facility] Kidney said her partner is unwilling and she won't be able to take the patient if it involves a W9.  After trying to discuss with her, supervisor thanked her and hung up. 0915: texts to [name], [facility] that Ms [name] Hammers will not take pt. 0915: attempts to recontact [facility] about potential bed there. [name], MSW, LCSW Advanced Care Supervisor 11/18/2019 1:14 PM   1300: Supervisor spoke to [place], Librarian, academic at [employer].  They would be willing to give Lavinia another shot in entering their shelter but the director of DRM would require an interview to make sure she is stable.  The earliest they could do this would be next Monday. 1430: Supervisor spoke to Joseph at [employer] and informed her of this development.  She asked if the interview could be webex so that we would no if she is accepted prior to discharge. [name], MSW, LCSW Advanced Care Supervisor 11/18/2019 2:39 PM

## 2019-11-18 NOTE — Progress Notes (Signed)
   11/18/19 2000  Psych Admission Type (Psych Patients Only)  Admission Status Involuntary  Psychosocial Assessment  Patient Complaints Anxiety  Eye Contact Brief  Facial Expression Flat  Affect Appropriate to circumstance  Speech Logical/coherent  Interaction Minimal  Motor Activity Other (Comment) (WDL)  Appearance/Hygiene Unremarkable  Behavior Characteristics Cooperative  Mood Anxious  Thought Process  Coherency WDL  Content WDL  Delusions Paranoid  Perception WDL  Hallucination None reported or observed  Judgment Poor  Confusion None  Danger to Self  Current suicidal ideation? Denies  Self-Injurious Behavior No self-injurious ideation or behavior indicators observed or expressed   Agreement Not to Harm Self Yes  Description of Agreement verbal  Danger to Others  Danger to Others None reported or observed

## 2019-11-18 NOTE — Progress Notes (Signed)
[facility] MD Progress Note  11/18/2019 10:27 AM [name]  MRN:  [id number]  Subjective: Amayrany reports, "I feel agitated today because of the my paranoia. I keep thinking that people are able to read my mind. I think that everyone knows what I'm thinking in my mind. That gets me irritated".  Objective: [name] with a past psychiatric history significant for schizophrenia, unspecified depression, methamphetamine use disorder in partial remission who was admitted on 10/16/2019 after she had left a group home, and developed suicidal ideation, auditory hallucinations and paranoia. Adalee is seen, chart reviewed. The chart findings discussed with the treatment team. She presents alert, oriented & aware of situation. She is visible on the unit, attending group sessions. She says she is feeling agitated today because she keeps thinking that people are able to read her mind. She says she thinks people are able to know what she is thinking about in her mind. She blamed it on her paranoia. Ranna is instructed, reminded & encouraged to understand that no one is capable reading other people's mind.  Reports having some anxiety as well, rates anxiety #2 in the scale of (1-10). Ten being the worst anxiety & one being the least anxiety. She denies any suicidal ideations. She still remains somewhat guarded with a flat/restricted affect. She is making fairly good eye contact. Sander at this time remains at or near her probable baseline.  She & the attending psychiatrist/SW had previously discussed the current complications with the social issues, about her housing as well as her guardianship issues.  Her vital signs remain stable, she is afebrile.  She is sleeping well & her appetite remains good. Adrianna at this time does not appear to be responding to any internal stimuli. And yet, Corolyn has the tendency to have some outburst from time to time. Her medications has been adjusted to meet her need. She is in  agreement to continue her current plan of care as already in progress. Denies any side effects. Resting well. See Tx plan (waiting for group home placement at this time).  Principal Problem: Schizophrenia, paranoid ([place])  Diagnosis: Principal Problem:   Schizophrenia, paranoid ([place]) Active Problems:   Depressed mood   Psychosis ([place])   MDD (major depressive disorder)  Total Time spent with patient: 15 minutes  Past Psychiatric History: See admission H&P  Past Medical History:  Past Medical History:  Diagnosis Date  . Burning with urination 05/03/2015  . Contraceptive management 05/03/2015  . Depression   . Dysmenorrhea 12/30/2013  . Heroin addiction ([place])   . Menorrhagia 12/30/2013  . Menstrual extraction 12/30/2013  . Migraines   . Social anxiety disorder 09/25/2016  . Suicidal ideations   . Vaginal odor 05/03/2015    Past Surgical History:  Procedure Laterality Date  . NO PAST SURGERIES     Family History:  Family History  Problem Relation Age of Onset  . Depression Mother   . Hypertension Father   . Hyperlipidemia Father   . Cancer Paternal Grandmother        breast, uterine  . Cirrhosis Paternal Grandfather        due to alcohol   Family Psychiatric  History: See admission H&P  Social History:  Social History   Substance and Sexual Activity  Alcohol Use Yes   Comment: BAC was clear     Social History   Substance and Sexual Activity  Drug Use Yes  . Types: Marijuana, Oxycodone   Comment: reports oxycodone  and marijuana    Social History   Socioeconomic History  . Marital status: Single    Spouse name: Not on file  . Number of children: Not on file  . Years of education: Not on file  . Highest education level: Not on file  Occupational History  . Occupation: Unemployed  Tobacco Use  . Smoking status: Current Every Day Smoker    Packs/day: 0.50    Types: Cigarettes  . Smokeless tobacco: Never Used  . Tobacco comment: Not ready to quit   Substance and Sexual Activity  . Alcohol use: Yes    Comment: BAC was clear  . Drug use: Yes    Types: Marijuana, Oxycodone    Comment: reports oxycodone and marijuana  . Sexual activity: Yes    Birth control/protection: Implant  Other Topics Concern  . Not on file  Social History Narrative   06/23/2019:  Pt stated that she is homeless, that she is a high school graduate, and that she is unemployed and not followed by any outpatient provider.      Lives with Dad. Mom has LGD, lives in [place]. 11th grader. Dog.   Social Determinants of Health   Financial Resource Strain:   . Difficulty of Paying Living Expenses:   Food Insecurity:   . Worried About Charity fundraiser in the Last Year:   . Arboriculturist in the Last Year:   Transportation Needs:   . Film/video editor (Medical):   [name] Lack of Transportation (Non-Medical):   Physical Activity:   . Days of Exercise per Week:   . Minutes of Exercise per Session:   Stress:   . Feeling of Stress :   Social Connections:   . Frequency of Communication with Friends and Family:   . Frequency of Social Gatherings with Friends and Family:   . Attends Religious Services:   . Active Member of Clubs or Organizations:   . Attends Archivist Meetings:   [name] Marital Status:    Additional Social History:  Pain Medications: Please see MAR Prescriptions: Please see MAR, prozac and cogentin per pt Over the Counter: Please see MAR History of alcohol / drug use?: Yes Longest period of sobriety (when/how long): Unknown Negative Consequences of Use: Legal Name of Substance 1: alcohol 1 - Frequency: UTA 1 - Duration: UTA- pt denies 1 - Last Use / Amount: UTA  Sleep: Good  Appetite:  Good  Current Medications: Current Facility-Administered Medications  Medication Dose Route Frequency Provider Last Rate Last Admin  . acetaminophen (TYLENOL) tablet 650 mg  650 mg Oral Q6H PRN Emmaline Kluver, FNP      . alum & mag hydroxide-simeth  (MAALOX/MYLANTA) 200-200-20 MG/5ML suspension 30 mL  30 mL Oral Q4H PRN Emmaline Kluver, FNP      . benztropine (COGENTIN) tablet 1 mg  1 mg Oral BID PRN Cobos, Myer Peer, MD   1 mg at 11/18/19 0748  . diphenhydrAMINE (BENADRYL) capsule 25 mg  25 mg Oral Once PRN Sharma Covert, MD       Or  . diphenhydrAMINE (BENADRYL) injection 25 mg  25 mg Intramuscular Once PRN Sharma Covert, MD      . FLUoxetine (PROZAC) capsule 60 mg  60 mg Oral Daily Sharma Covert, MD   60 mg at 11/18/19 0748  . gabapentin (NEURONTIN) capsule 400 mg  400 mg Oral TID Sharma Covert, MD   400 mg at 11/18/19 0748  .  haloperidol (HALDOL) tablet 5 mg  5 mg Oral Q6H PRN Cobos, Myer Peer, MD   5 mg at 11/17/19 1558   Or  . haloperidol lactate (HALDOL) injection 5 mg  5 mg Intramuscular Q6H PRN Cobos, Myer Peer, MD      . LORazepam (ATIVAN) tablet 1 mg  1 mg Oral Q6H PRN Lindell Spar I, NP   1 mg at 11/18/19 0916   Or  . LORazepam (ATIVAN) injection 1 mg  1 mg Intramuscular Q6H PRN Elridge Stemm I, NP      . magnesium hydroxide (MILK OF MAGNESIA) suspension 30 mL  30 mL Oral Daily PRN Emmaline Kluver, FNP      . nicotine (NICODERM CQ - dosed in mg/24 hr) patch 7 mg  7 mg Transdermal Daily Cobos, Myer Peer, MD   7 mg at 11/18/19 0752  . paliperidone (INVEGA) 24 hr tablet 12 mg  12 mg Oral QHS Money, Lowry Ram, FNP      . traZODone (DESYREL) tablet 25 mg  25 mg Oral QHS PRN Cobos, Myer Peer, MD       Lab Results:  No results found for this or any previous visit (from the past 48 hour(s)).  Blood Alcohol level:  Lab Results  Component Value Date   ETH <10 10/16/2019   ETH <10 123456   Metabolic Disorder Labs: Lab Results  Component Value Date   HGBA1C 5.4 10/16/2019   MPG 108.28 10/16/2019   MPG 102.54 05/17/2019   Lab Results  Component Value Date   PROLACTIN 113.0 (H) 11/11/2019   PROLACTIN 152.0 (H) 10/16/2019   Lab Results  Component Value Date   CHOL 138 10/16/2019   TRIG 60 10/16/2019    HDL 36 (L) 10/16/2019   CHOLHDL 3.8 10/16/2019   VLDL 12 10/16/2019   LDLCALC 90 10/16/2019   LDLCALC 96 05/17/2019   Physical Findings: AIMS: Facial and Oral Movements Muscles of Facial Expression: None, normal Lips and Perioral Area: None, normal Jaw: None, normal Tongue: None, normal,Extremity Movements Upper (arms, wrists, hands, fingers): None, normal Lower (legs, knees, ankles, toes): None, normal, Trunk Movements Neck, shoulders, hips: None, normal, Overall Severity Severity of abnormal movements (highest score from questions above): None, normal Incapacitation due to abnormal movements: None, normal Patient's awareness of abnormal movements (rate only patient's report): No Awareness, Dental Status Current problems with teeth and/or dentures?: No Does patient usually wear dentures?: No  CIWA:  CIWA-Ar Total: 1 COWS:  COWS Total Score: 2  Musculoskeletal: Strength & Muscle Tone: within normal limits Gait & Station: normal Patient leans: N/A  Psychiatric Specialty Exam: Physical Exam  Nursing note and vitals reviewed. Constitutional: She is oriented to person, place, and time. She appears well-developed and well-nourished.  HENT:  Head: Normocephalic and atraumatic.  Cardiovascular:  Elevated pulse rate: 104  Respiratory: Effort normal.  Genitourinary:    Genitourinary Comments: Deferred   Musculoskeletal:        General: Normal range of motion.     Cervical back: Normal range of motion.  Neurological: She is alert and oriented to person, place, and time.  Skin: Skin is warm.    Review of Systems  Constitutional: Negative for chills, diaphoresis and fever.  HENT: Negative for congestion, rhinorrhea, sneezing and sore throat.   Eyes: Negative for discharge.  Respiratory: Negative for cough, chest tightness, shortness of breath and wheezing.   Cardiovascular: Negative for chest pain and palpitations.  Gastrointestinal: Negative for diarrhea, nausea and vomiting.  Genitourinary: Negative for difficulty urinating.  Musculoskeletal: Negative.   Allergic/Immunologic: Negative for environmental allergies and food allergies.       Allergies: Abilify, Sertraline (Zoloft)  Neurological: Negative for dizziness, tremors, seizures, syncope, light-headedness and headaches.  Psychiatric/Behavioral: Positive for dysphoric mood ("Improving"). Negative for agitation, behavioral problems, confusion, decreased concentration, hallucinations, self-injury, sleep disturbance and suicidal ideas. The patient is nervous/anxious. The patient is not hyperactive.     Blood pressure 100/72, pulse (!) 104, temperature 98.2 F (36.8 C), temperature source Oral, resp. rate 16, height 5\' 3"  (1.6 m), weight 67.8 kg, SpO2 100 %.Body mass index is 26.48 kg/m.  General Appearance: Casual  Eye Contact:  Fair  Speech:  Normal Rate  Volume:  Decreased  Mood:  Anxious, but says mood is improving, rates anxiety at #2  Affect:  Flat  Thought Process:  Coherent and Descriptions of Associations: Circumstantial  Orientation:  Full (Time, Place, and Person)  Thought Content:  Hallucinations: Auditory and Paranoid Ideation  Suicidal Thoughts:  No  Homicidal Thoughts:  No  Memory:  Immediate;   Fair Recent;   Fair Remote;   Fair  Judgement:  Intact  Insight:  Fair  Psychomotor Activity:  Decreased  Concentration:  Concentration: Fair and Attention Span: Fair  Recall:  [employer] of Knowledge:  Fair  Language:  Good  Akathisia:  Negative  Handed:  Right  AIMS (if indicated):     Assets:  Desire for Improvement Resilience  ADL's:  Intact  Cognition:  WNL  Sleep:  Number of Hours: 5   Treatment Plan Summary: Daily contact with patient to assess and evaluate symptoms and progress in treatment, Medication management and Plan : Patient is seen and examined.  [name] with the above-stated past psychiatric history who is seen in follow-up.   - Continue inpatient  hospitalization. - Will continue today 11/18/2019 plan as below except where it is noted.  Diagnosis:  #1 psychosis: Schizoaffective disorder versus schizophrenia versus substance-induced psychotic disorder,  #2 methamphetamine dependence in partial remission,  #3 unspecified depression,  #4 urinary tract infection (resolved)  Patient is seen for follow-up care assessment.  She is basically at her baseline.  She denies any auditory hallucinations, delusions/paranoia. Denies suicidal or homicidal ideations.  She denies any side effects to her current medications.  Repeated urinalysis showed essentially normal urine with no evidence of infection.  Social issues continue with placement and guardianship.  No change in her medications today.  1.  Continue Cogentin 1 mg p.o. twice daily as needed tremors and EPS. 2.  Continue fluoxetine 60 mg p.o. daily for depression and anxiety. 3.  Continue gabapentin 400 mg p.o. 3 times daily for anxiety and mood stability. 4.  Continue Haldol as needed & lorazepam as needed for agitation. 5.  Increased paliperidone to12 mg p.o. daily for psychosis/mood control. 6.  Decreased trazodone to 25 mg p.o. nightly as needed insomnia. 7.  Disposition planning-in progress. 8.  Patient continues to attend group sessions.  Lindell Spar, NP, PMHNP, FNP-BC  11/18/2019, 10:27 AMPatient ID: [name], female   DOB: 10-06-2000, 19 y.o.MRN: [id number] Patient ID: SASHAE SPAGNOLO, female   DOB: Sep 17, 2000, 19 y.o.   MRN: [id number] Patient ID: LURIE CETRONE, female   DOB: 07-15-2001, 19 y.o.   MRN: [id number]

## 2019-11-19 NOTE — Progress Notes (Signed)
   11/19/19 2200  Psych Admission Type (Psych Patients Only)  Admission Status Involuntary  Psychosocial Assessment  Patient Complaints Anxiety  Eye Contact Brief  Facial Expression Flat  Affect Appropriate to circumstance  Speech Logical/coherent  Interaction Minimal  Motor Activity Other (Comment) (WDL)  Appearance/Hygiene Unremarkable  Behavior Characteristics Cooperative  Mood Anxious  Thought Process  Coherency WDL  Content WDL  Delusions Paranoid  Perception WDL  Hallucination None reported or observed  Judgment Poor  Confusion None  Danger to Self  Current suicidal ideation? Denies  Self-Injurious Behavior No self-injurious ideation or behavior indicators observed or expressed   Agreement Not to Harm Self Yes  Description of Agreement verbally contracts for safety  Danger to Others  Danger to Others None reported or observed

## 2019-11-19 NOTE — Progress Notes (Signed)
Recreation Therapy Notes  Date: 5.7.21 Time: 0930 Location: [address] Group Room  Group Topic: Stress Management  Goal Area(s) Addresses:  Patient will identify positive stress management techniques. Patient will identify benefits of using stress management post d/c.  Intervention: Stress Management  Activity: Meditation.  LRT played a meditation that focused on making choices.  Patients were to listen and follow along as meditation played to engage in activity.  Education:  Stress Management, Discharge Planning.   Education Outcome: Acknowledges Education  Clinical Observations/Feedback::  Pt did not attend group activity.    [name], LRT/CTRS         [name] A 11/19/2019 11:29 AM

## 2019-11-19 NOTE — Progress Notes (Signed)
[facility] MD Progress Note  11/19/2019 12:13 PM ALYXIS LANIUS  MRN:  [id number]  Subjective: Sinai reports, "It is going well. I slept well last night".  Objective: [name] with a past psychiatric history significant for schizophrenia, unspecified depression, methamphetamine use disorder in partial remission who was admitted on 10/16/2019 after she had left a group home, and developed suicidal ideation, auditory hallucinations and paranoia. Jemiya is seen, chart reviewed. The chart findings discussed with the treatment team. She presents alert, oriented & aware of situation. She is visible on the unit, attending group sessions. She says she is doing well. Denies any complaints or new issues today. Denies having any anxiety symptoms, rates anxiety/depression #0, in the scale of (1-10). Ten being the worst anxiety & one being the least anxiety/depression. She denies any suicidal ideations. She still remains somewhat guarded with an improved affect. She is making good eye contact.  Frimy at this time remains at or near her probable baseline. She & the attending psychiatrist/SW had previously discussed the current complications with the social issues, about her housing as well as her guardianship issues.  Her vital signs remain stable, she is afebrile.  She is sleeping well & her appetite remains good. Kailly at this time does not appear to be responding to any internal stimuli. And yet, Mayzee has the tendency to have some outburst from time to time. Her medications has been adjusted to meet her need. She is in agreement to continue her current plan of care as already in progress. Denies any side effects. Resting well. See Tx plan (waiting for group home placement at this time).  Principal Problem: Schizophrenia, paranoid ([place])  Diagnosis: Principal Problem:   Schizophrenia, paranoid ([place]) Active Problems:   Depressed mood   Psychosis ([place])   MDD (major depressive disorder)  Total Time  spent with patient: 15 minutes  Past Psychiatric History: See admission H&P  Past Medical History:  Past Medical History:  Diagnosis Date  . Burning with urination 05/03/2015  . Contraceptive management 05/03/2015  . Depression   . Dysmenorrhea 12/30/2013  . Heroin addiction ([place])   . Menorrhagia 12/30/2013  . Menstrual extraction 12/30/2013  . Migraines   . Social anxiety disorder 09/25/2016  . Suicidal ideations   . Vaginal odor 05/03/2015    Past Surgical History:  Procedure Laterality Date  . NO PAST SURGERIES     Family History:  Family History  Problem Relation Age of Onset  . Depression Mother   . Hypertension Father   . Hyperlipidemia Father   . Cancer Paternal Grandmother        breast, uterine  . Cirrhosis Paternal Grandfather        due to alcohol   Family Psychiatric  History: See admission H&P  Social History:  Social History   Substance and Sexual Activity  Alcohol Use Yes   Comment: BAC was clear     Social History   Substance and Sexual Activity  Drug Use Yes  . Types: Marijuana, Oxycodone   Comment: reports oxycodone and marijuana    Social History   Socioeconomic History  . Marital status: Single    Spouse name: Not on file  . Number of children: Not on file  . Years of education: Not on file  . Highest education level: Not on file  Occupational History  . Occupation: Unemployed  Tobacco Use  . Smoking status: Current Every Day Smoker    Packs/day: 0.50    Types:  Cigarettes  . Smokeless tobacco: Never Used  . Tobacco comment: Not ready to quit  Substance and Sexual Activity  . Alcohol use: Yes    Comment: BAC was clear  . Drug use: Yes    Types: Marijuana, Oxycodone    Comment: reports oxycodone and marijuana  . Sexual activity: Yes    Birth control/protection: Implant  Other Topics Concern  . Not on file  Social History Narrative   06/23/2019:  Pt stated that she is homeless, that she is a high school graduate, and that she is  unemployed and not followed by any outpatient provider.      Lives with Dad. Mom has LGD, lives in [place]. 11th grader. Dog.   Social Determinants of Health   Financial Resource Strain:   . Difficulty of Paying Living Expenses:   Food Insecurity:   . Worried About Charity fundraiser in the Last Year:   . Arboriculturist in the Last Year:   Transportation Needs:   . Film/video editor (Medical):   [name] Lack of Transportation (Non-Medical):   Physical Activity:   . Days of Exercise per Week:   . Minutes of Exercise per Session:   Stress:   . Feeling of Stress :   Social Connections:   . Frequency of Communication with Friends and Family:   . Frequency of Social Gatherings with Friends and Family:   . Attends Religious Services:   . Active Member of Clubs or Organizations:   . Attends Archivist Meetings:   [name] Marital Status:    Additional Social History:  Pain Medications: Please see MAR Prescriptions: Please see MAR, prozac and cogentin per pt Over the Counter: Please see MAR History of alcohol / drug use?: Yes Longest period of sobriety (when/how long): Unknown Negative Consequences of Use: Legal Name of Substance 1: alcohol 1 - Frequency: UTA 1 - Duration: UTA- pt denies 1 - Last Use / Amount: UTA  Sleep: Good  Appetite:  Good  Current Medications: Current Facility-Administered Medications  Medication Dose Route Frequency Provider Last Rate Last Admin  . acetaminophen (TYLENOL) tablet 650 mg  650 mg Oral Q6H PRN Emmaline Kluver, FNP      . alum & mag hydroxide-simeth (MAALOX/MYLANTA) 200-200-20 MG/5ML suspension 30 mL  30 mL Oral Q4H PRN Emmaline Kluver, FNP      . benztropine (COGENTIN) tablet 1 mg  1 mg Oral BID PRN Cobos, Myer Peer, MD   1 mg at 11/19/19 1019  . diphenhydrAMINE (BENADRYL) capsule 25 mg  25 mg Oral Once PRN Sharma Covert, MD       Or  . diphenhydrAMINE (BENADRYL) injection 25 mg  25 mg Intramuscular Once PRN Sharma Covert, MD      .  FLUoxetine (PROZAC) capsule 60 mg  60 mg Oral Daily Sharma Covert, MD   60 mg at 11/19/19 0801  . gabapentin (NEURONTIN) capsule 400 mg  400 mg Oral TID Sharma Covert, MD   400 mg at 11/19/19 0801  . haloperidol (HALDOL) tablet 5 mg  5 mg Oral Q6H PRN Cobos, Myer Peer, MD   5 mg at 11/17/19 1558   Or  . haloperidol lactate (HALDOL) injection 5 mg  5 mg Intramuscular Q6H PRN Cobos, Myer Peer, MD      . LORazepam (ATIVAN) tablet 1 mg  1 mg Oral Q6H PRN Lindell Spar I, NP   1 mg at 11/19/19 [id number]   Or  .  LORazepam (ATIVAN) injection 1 mg  1 mg Intramuscular Q6H PRN Jalei Shibley I, NP      . magnesium hydroxide (MILK OF MAGNESIA) suspension 30 mL  30 mL Oral Daily PRN Emmaline Kluver, FNP      . nicotine (NICODERM CQ - dosed in mg/24 hr) patch 7 mg  7 mg Transdermal Daily Cobos, Myer Peer, MD   7 mg at 11/19/19 0804  . paliperidone (INVEGA) 24 hr tablet 12 mg  12 mg Oral QHS Money, Travis B, FNP   12 mg at 11/18/19 2200  . traZODone (DESYREL) tablet 25 mg  25 mg Oral QHS PRN Cobos, Myer Peer, MD       Lab Results:  No results found for this or any previous visit (from the past 48 hour(s)).  Blood Alcohol level:  Lab Results  Component Value Date   ETH <10 10/16/2019   ETH <10 123456   Metabolic Disorder Labs: Lab Results  Component Value Date   HGBA1C 5.4 10/16/2019   MPG 108.28 10/16/2019   MPG 102.54 05/17/2019   Lab Results  Component Value Date   PROLACTIN 113.0 (H) 11/11/2019   PROLACTIN 152.0 (H) 10/16/2019   Lab Results  Component Value Date   CHOL 138 10/16/2019   TRIG 60 10/16/2019   HDL 36 (L) 10/16/2019   CHOLHDL 3.8 10/16/2019   VLDL 12 10/16/2019   LDLCALC 90 10/16/2019   LDLCALC 96 05/17/2019   Physical Findings: AIMS: Facial and Oral Movements Muscles of Facial Expression: None, normal Lips and Perioral Area: None, normal Jaw: None, normal Tongue: None, normal,Extremity Movements Upper (arms, wrists, hands, fingers): None, normal Lower  (legs, knees, ankles, toes): None, normal, Trunk Movements Neck, shoulders, hips: None, normal, Overall Severity Severity of abnormal movements (highest score from questions above): None, normal Incapacitation due to abnormal movements: None, normal Patient's awareness of abnormal movements (rate only patient's report): No Awareness, Dental Status Current problems with teeth and/or dentures?: No Does patient usually wear dentures?: No  CIWA:  CIWA-Ar Total: 1 COWS:  COWS Total Score: 2  Musculoskeletal: Strength & Muscle Tone: within normal limits Gait & Station: normal Patient leans: N/A  Psychiatric Specialty Exam: Physical Exam  Nursing note and vitals reviewed. Constitutional: She is oriented to person, place, and time. She appears well-developed and well-nourished.  HENT:  Head: Normocephalic and atraumatic.  Cardiovascular:  Elevated pulse rate: 104  Respiratory: Effort normal.  Genitourinary:    Genitourinary Comments: Deferred   Musculoskeletal:        General: Normal range of motion.     Cervical back: Normal range of motion.  Neurological: She is alert and oriented to person, place, and time.  Skin: Skin is warm.    Review of Systems  Constitutional: Negative for chills, diaphoresis and fever.  HENT: Negative for congestion, rhinorrhea, sneezing and sore throat.   Eyes: Negative for discharge.  Respiratory: Negative for cough, chest tightness, shortness of breath and wheezing.   Cardiovascular: Negative for chest pain and palpitations.  Gastrointestinal: Negative for diarrhea, nausea and vomiting.  Genitourinary: Negative for difficulty urinating.  Musculoskeletal: Negative.   Allergic/Immunologic: Negative for environmental allergies and food allergies.       Allergies: Abilify, Sertraline (Zoloft)  Neurological: Negative for dizziness, tremors, seizures, syncope, light-headedness and headaches.  Psychiatric/Behavioral: Positive for dysphoric mood ("Improving").  Negative for agitation, behavioral problems, confusion, decreased concentration, hallucinations, self-injury, sleep disturbance and suicidal ideas. The patient is nervous/anxious. The patient is not hyperactive.  Blood pressure 100/62, pulse (!) 149, temperature (!) 97.5 F (36.4 C), temperature source Oral, resp. rate 18, height 5\' 3"  (1.6 m), weight 67.8 kg, SpO2 100 %.Body mass index is 26.48 kg/m.  General Appearance: Casual  Eye Contact:  Fair  Speech:  Normal Rate  Volume:  Decreased  Mood:  Euthymic,   Affect:  Appropriate and Congruent  Thought Process:  Coherent and Descriptions of Associations: Intact  Orientation:  Full (Time, Place, and Person)  Thought Content:  Hallucinations: Auditory and Paranoid Ideation  Suicidal Thoughts:  No  Homicidal Thoughts:  No  Memory:  Immediate;   Fair Recent;   Fair Remote;   Fair  Judgement:  Intact  Insight:  Fair  Psychomotor Activity:  Decreased  Concentration:  Concentration: Fair and Attention Span: Fair  Recall:  [employer] of Knowledge:  Fair  Language:  Good  Akathisia:  Negative  Handed:  Right  AIMS (if indicated):     Assets:  Desire for Improvement Resilience  ADL's:  Intact  Cognition:  WNL  Sleep:  Number of Hours: 5.5   Treatment Plan Summary: Daily contact with patient to assess and evaluate symptoms and progress in treatment, Medication management and Plan : Patient is seen and examined.  [name] with the above-stated past psychiatric history who is seen in follow-up.   - Continue inpatient hospitalization. - Will continue today 11/19/2019 plan as below except where it is noted.  Diagnosis:  #1 psychosis: Schizoaffective disorder versus schizophrenia versus substance-induced psychotic disorder,  #2 methamphetamine dependence in partial remission,  #3 unspecified depression,  #4 urinary tract infection (resolved)  Patient is seen for follow-up care assessment.  She is basically at  her baseline.  She denies any auditory hallucinations, delusions/paranoia. Denies suicidal or homicidal ideations.  She denies any side effects to her current medications.  Repeated urinalysis showed essentially normal urine with no evidence of infection.  Social issues continue with placement and guardianship.  No change in her medications today.  1.  Continue Cogentin 1 mg p.o. twice daily as needed tremors and EPS. 2.  Continue fluoxetine 60 mg p.o. daily for depression and anxiety. 3.  Continue gabapentin 400 mg p.o. 3 times daily for anxiety and mood stability. 4.  Continue Haldol as needed & lorazepam as needed for agitation. 5.  Continue paliperidone to12 mg p.o. daily for psychosis/mood control. 6.  Continue trazodone to 25 mg p.o. nightly as needed insomnia. 7.  Disposition planning-in progress. 8.  Patient continues to attend group sessions.  Lindell Spar, NP, PMHNP, FNP-BC  11/19/2019, 12:13 PMPatient ID: [name], female   DOB: 2001/06/21, 19 y.o.MRN: [id number] Patient ID: LAYANN COSTABILE, female   DOB: 08/09/2000, 19 y.o.   MRN: [id number] Patient ID: ZNIYAH HINCE, female   DOB: 2000/12/13, 19 y.o.   MRN: [id number] Patient ID: MARCELLINE CORTES, female   DOB: 02-24-2001, 19 y.o.   MRN: [id number]

## 2019-11-19 NOTE — BHH Counselor (Signed)
Patient expresses interest in discharging today. She reports that she can temporarily stay with her boyfriend, Marcie Bal [phone]) and granted permission for CSW Team to contact him to confirm. Patient has not established permanent housing arrangements and CSW shared this may hinder discharge.  CSW and patient will meet later this afternoon to contact the [place] for updates regarding patient's SSI application.   [name], MSW, LCSW-A Clinical Social Worker [facility]

## 2019-11-19 NOTE — BHH Counselor (Signed)
CSW and patient met in the [place] office to contact the [place] for updates regarding patient's SSI and SSI Survivor's Benefit claims. CSW and patient last called the SSA on 04/15, the following is part of a note from that call:  ACTT reports patient has applications for SSI and SSI Survivor Benefits. The Survivor Benefits are a larger amount of money than the SSI, and patient would be eligible for Survivor Benefits until she turns 57 in March 2022.  CSW and patient met and called the [place] [phone]). Over the course of 45 minutes, it was determined that patient has three separate applications on file and a representative reviewed the applications with patient and CSW.  1.) Patient's application for SSI was denied. A letter was mailed on 04/02 with this decision. 2.) Patient's application for "student benefits" is still processing. 3.) Patient's application for SSI benefits under her father is still pending. This application was received on 03/26. There is not an estimated decision date. Of note, patients mother's information was not included in this application. There is not an application for Survivor's Benefits. Patient was able to provide her deceased mother's name and date of birth to the representative, but not her mother's SSN. The representative stated she would forward correspondence to the local [place] office to follow up with patient.  During today's call, patient updated her mailing address from her prior group home ([facility]) to the office for [employer]. The representative noted that there are 2 claims, same as in April 2021. Patient's claim for SSI was denied. Patient's second claim for survivor's benefits is still pending a "medical review."   Patient and CSW explained the purpose of the call, the representative "started a disability claim" with patient via phone. Representative will email documents to CSW  for assistance.   Patient has been a phone appointment with the [place] for 05/19 at 9:30am. Patient may still complete this interview if she is discharged before then.  [name], MSW, LCSW-A Clinical Social Worker [facility]

## 2019-11-19 NOTE — Progress Notes (Signed)
Pt denied SI/HI/AVH.  Pt has been calm so far this shift.  RN administered medications to pt per MD orders and assessed for needs and/or concerns.  Pt is ambulatory on the unit, but has not been attending group meetings today. NO concerns noted at this time.  RN will continue to monitor and provide support as needed.

## 2019-11-19 NOTE — BHH Group Notes (Signed)
LCSW Group Therapy Note 11/19/2019 1:36 PM  Type of Therapy/Topic: Group Therapy: Emotion Regulation  Participation Level: Did Not Attend   Description of Group:  The purpose of this group is to assist patients in learning to regulate negative emotions and experience positive emotions. Patients will be guided to discuss ways in which they have been vulnerable to their negative emotions. These vulnerabilities will be juxtaposed with experiences of positive emotions or situations, and patients will be challenged to use positive emotions to combat negative ones. Special emphasis will be placed on coping with negative emotions in conflict situations, and patients will process healthy conflict resolution skills.  Therapeutic Goals: 1. Patient will identify two positive emotions or experiences to reflect on in order to balance out negative emotions 2. Patient will label two or more emotions that they find the most difficult to experience 3. Patient will demonstrate positive conflict resolution skills through discussion and/or role plays  Summary of Patient Progress:   Invited, chose not to attend.     Therapeutic Modalities:  Cognitive Behavioral Therapy Feelings Identification Dialectical Behavioral Therapy   Theresa Duty Clinical Social Worker

## 2019-11-19 NOTE — Tx Team (Signed)
Interdisciplinary Treatment and Diagnostic Plan Update  11/19/2019 Time of Session: 8:45am  Julia [name] MRN: [id number]  Principal Diagnosis: Schizophrenia, paranoid [facility])  Secondary Diagnoses: Principal Problem:   Schizophrenia, paranoid ([place]) Active Problems:   Depressed mood   Psychosis ([place])   MDD (major depressive disorder)   Current Medications:  Current Facility-Administered Medications  Medication Dose Route Frequency Provider Last Rate Last Admin  . acetaminophen (TYLENOL) tablet 650 mg  650 mg Oral Q6H PRN Emmaline Kluver, FNP      . alum & mag hydroxide-simeth (MAALOX/MYLANTA) 200-200-20 MG/5ML suspension 30 mL  30 mL Oral Q4H PRN Emmaline Kluver, FNP      . benztropine (COGENTIN) tablet 1 mg  1 mg Oral BID PRN Cobos, Myer Peer, MD   1 mg at 11/19/19 1019  . diphenhydrAMINE (BENADRYL) capsule 25 mg  25 mg Oral Once PRN Sharma Covert, MD       Or  . diphenhydrAMINE (BENADRYL) injection 25 mg  25 mg Intramuscular Once PRN Sharma Covert, MD      . FLUoxetine (PROZAC) capsule 60 mg  60 mg Oral Daily Sharma Covert, MD   60 mg at 11/19/19 0801  . gabapentin (NEURONTIN) capsule 400 mg  400 mg Oral TID Sharma Covert, MD   400 mg at 11/19/19 0801  . haloperidol (HALDOL) tablet 5 mg  5 mg Oral Q6H PRN Cobos, Myer Peer, MD   5 mg at 11/17/19 1558   Or  . haloperidol lactate (HALDOL) injection 5 mg  5 mg Intramuscular Q6H PRN Cobos, Myer Peer, MD      . LORazepam (ATIVAN) tablet 1 mg  1 mg Oral Q6H PRN Lindell Spar I, NP   1 mg at 11/19/19 [id number]   Or  . LORazepam (ATIVAN) injection 1 mg  1 mg Intramuscular Q6H PRN Nwoko, Agnes I, NP      . magnesium hydroxide (MILK OF MAGNESIA) suspension 30 mL  30 mL Oral Daily PRN Emmaline Kluver, FNP      . nicotine (NICODERM CQ - dosed in mg/24 hr) patch 7 mg  7 mg Transdermal Daily Cobos, Myer Peer, MD   7 mg at 11/19/19 0804  . paliperidone (INVEGA) 24 hr tablet 12 mg  12 mg Oral QHS Money, Travis B, FNP   12 mg at 11/18/19 2200   . traZODone (DESYREL) tablet 25 mg  25 mg Oral QHS PRN Cobos, Myer Peer, MD       PTA Medications: Medications Prior to Admission  Medication Sig Dispense Refill Last Dose  . ibuprofen (ADVIL) 400 MG tablet Take 400 mg by mouth every 6 (six) hours as needed for headache or mild pain.     . benztropine (COGENTIN) 1 MG tablet Take 1 tablet (1 mg total) by mouth 2 (two) times daily. (Patient not taking: Reported on 10/16/2019) 60 tablet 1 Not Taking at Unknown time  . FLUoxetine (PROZAC) 20 MG capsule Take 1 capsule (20 mg total) by mouth daily. (Patient not taking: Reported on 10/16/2019) 30 capsule 1 Not Taking at Unknown time  . gabapentin (NEURONTIN) 400 MG capsule Take 1 capsule (400 mg total) by mouth 3 (three) times daily. (Patient not taking: Reported on 10/16/2019) 90 capsule 1 Not Taking at Unknown time  . haloperidol decanoate (HALDOL DECANOATE) 100 MG/ML injection Inject 0.5 mLs (50 mg total) into the muscle every 30 (thirty) days. NEXT DOSE DUE AROUND September 15, 2019 (Patient not taking: Reported on 10/16/2019) 1  mL 1 Not Taking at Unknown time  . paliperidone (INVEGA SUSTENNA) 156 MG/ML SUSY injection Inject 1 mL (156 mg total) into the muscle every 30 (thirty) days. NEXT DOSE DUE AROUND September 15, 2019 (Patient not taking: Reported on 10/16/2019) 1.2 mL 1 Not Taking at Unknown time  . paliperidone (INVEGA) 3 MG 24 hr tablet Take 1 tablet (3 mg total) by mouth daily. (Patient not taking: Reported on 10/16/2019) 30 tablet 1 Not Taking at Unknown time  . traZODone (DESYREL) 50 MG tablet Take 1 tablet (50 mg total) by mouth at bedtime. (Patient not taking: Reported on 10/16/2019) 30 tablet 1 Not Taking at Unknown time    Patient Stressors:    Patient Strengths:    Treatment Modalities: Medication Management, Group therapy, Case management,  1 to 1 session with clinician, Psychoeducation, Recreational therapy.   Physician Treatment Plan for Primary Diagnosis: Schizophrenia, paranoid ([place]) Long Term  Goal(s): Improvement in symptoms so as ready for discharge Improvement in symptoms so as ready for discharge   Short Term Goals: Ability to identify changes in lifestyle to reduce recurrence of condition will improve Ability to verbalize feelings will improve Ability to disclose and discuss suicidal ideas Ability to demonstrate self-control will improve Ability to identify and develop effective coping behaviors will improve Ability to maintain clinical measurements within normal limits will improve Compliance with prescribed medications will improve Ability to identify changes in lifestyle to reduce recurrence of condition will improve Ability to verbalize feelings will improve Ability to disclose and discuss suicidal ideas Ability to demonstrate self-control will improve Ability to identify and develop effective coping behaviors will improve Ability to maintain clinical measurements within normal limits will improve Compliance with prescribed medications will improve  Medication Management: Evaluate patient's response, side effects, and tolerance of medication regimen.  Therapeutic Interventions: 1 to 1 sessions, Unit Group sessions and Medication administration.  Evaluation of Outcomes: Adequate for Discharge  Physician Treatment Plan for Secondary Diagnosis: Principal Problem:   Schizophrenia, paranoid ([place]) Active Problems:   Depressed mood   Psychosis ([place])   MDD (major depressive disorder)  Long Term Goal(s): Improvement in symptoms so as ready for discharge Improvement in symptoms so as ready for discharge   Short Term Goals: Ability to identify changes in lifestyle to reduce recurrence of condition will improve Ability to verbalize feelings will improve Ability to disclose and discuss suicidal ideas Ability to demonstrate self-control will improve Ability to identify and develop effective coping behaviors will improve Ability to maintain clinical measurements within  normal limits will improve Compliance with prescribed medications will improve Ability to identify changes in lifestyle to reduce recurrence of condition will improve Ability to verbalize feelings will improve Ability to disclose and discuss suicidal ideas Ability to demonstrate self-control will improve Ability to identify and develop effective coping behaviors will improve Ability to maintain clinical measurements within normal limits will improve Compliance with prescribed medications will improve     Medication Management: Evaluate patient's response, side effects, and tolerance of medication regimen.  Therapeutic Interventions: 1 to 1 sessions, Unit Group sessions and Medication administration.  Evaluation of Outcomes: Adequate for Discharge   RN Treatment Plan for Primary Diagnosis: Schizophrenia, paranoid ([place]) Long Term Goal(s): Knowledge of disease and therapeutic regimen to maintain health will improve  Short Term Goals: Ability to participate in decision making will improve, Ability to verbalize feelings will improve, Ability to disclose and discuss suicidal ideas, Ability to identify and develop effective coping behaviors will improve and Compliance with  prescribed medications will improve  Medication Management: RN will administer medications as ordered by provider, will assess and evaluate patient's response and provide education to patient for prescribed medication. RN will report any adverse and/or side effects to prescribing provider.  Therapeutic Interventions: 1 on 1 counseling sessions, Psychoeducation, Medication administration, Evaluate responses to treatment, Monitor vital signs and CBGs as ordered, Perform/monitor CIWA, COWS, AIMS and Fall Risk screenings as ordered, Perform wound care treatments as ordered.  Evaluation of Outcomes: Adequate for Discharge   LCSW Treatment Plan for Primary Diagnosis: Schizophrenia, paranoid ([place]) Long Term Goal(s): Safe transition  to appropriate next level of care at discharge, Engage patient in therapeutic group addressing interpersonal concerns.  Short Term Goals: Engage patient in aftercare planning with referrals and resources and Increase skills for wellness and recovery  Therapeutic Interventions: Assess for all discharge needs, 1 to 1 time with Social worker, Explore available resources and support systems, Assess for adequacy in community support network, Educate family and significant other(s) on suicide prevention, Complete Psychosocial Assessment, Interpersonal group therapy.  Evaluation of Outcomes: Progressing   Progress in Treatment: Attending groups: Yes. Participating in groups: Yes. Taking medication as prescribed: Yes. Toleration medication: Yes. Family/Significant other contact made: Yes, individual(s) contacted:  with pt's father  Patient understands diagnosis: Yes. Discussing patient identified problems/goals with staff: Yes. Medical problems stabilized or resolved: Yes. Denies suicidal/homicidal ideation: Yes. Issues/concerns per patient self-inventory: No. Other:   New problem(s) identified: Yes, Describe:  group home placement   New Short Term/Long Term Goal(s): Medication stabilization, elimination of SI thoughts, and development of a comprehensive mental wellness plan.   Patient Goals:    Discharge Plan or Barriers: Patient is being reviewed for possible group home placement. Patient was eventually declined by group home due to financing.  Patient has an interview with [employer] for possible housing services on Monday,11/22/19.   CSW will continue to follow up for appropriate referrals and possible discharge planning  Reason for Continuation of Hospitalization: Anxiety Delusions  Medication stabilization  Estimated Length of Stay: TBD  Attendees: Patient:  10/22/2019   Physician: Dr. Jake Samples, MD; Dr. Neita Garnet, MD; Dr. Myles Lipps, MD 10/22/2019   Nursing: Gilberto Better., RN  10/22/2019   RN Care Manager: 10/22/2019   Social Worker: Lurline Idol, LCSW; Radonna Ricker, LCSW 10/22/2019   Recreational Therapist:  10/22/2019   Other: Ovidio Kin, MSW intern  10/22/2019   Other:  10/22/2019   Other: 10/22/2019      Scribe for Treatment Team: Marylee Floras, [place] 11/19/2019 10:36 AM

## 2019-11-20 MED ORDER — ONDANSETRON 4 MG PO TBDP
4.0000 mg | ORAL_TABLET | Freq: Three times a day (TID) | ORAL | Status: DC | PRN
  Administered 2019-11-21: 4 mg via ORAL
  Filled 2019-11-20: qty 1

## 2019-11-20 NOTE — Progress Notes (Signed)
Pt c/o of feeling agitated this morning because she's tired of being here in the hospital. She requested medication for agitation and was administered haldol and benadryl. She reports decreased intermittent AVH, seeing random things and hearing random voices. She denies SI/HI.     11/20/19 0900  Psych Admission Type (Psych Patients Only)  Admission Status Involuntary  Psychosocial Assessment  Patient Complaints Anxiety;Agitation  Eye Contact Brief  Facial Expression Flat  Affect Appropriate to circumstance  Speech Logical/coherent  Interaction Minimal  Motor Activity Slow  Appearance/Hygiene Unremarkable  Behavior Characteristics Agitated  Mood Anxious  Aggressive Behavior  Effect No apparent injury  Thought Process  Coherency Concrete thinking  Content WDL  Delusions Paranoid  Perception WDL  Hallucination Auditory;Visual  Judgment Limited  Confusion None  Danger to Self  Current suicidal ideation? Denies  Self-Injurious Behavior No self-injurious ideation or behavior indicators observed or expressed   Danger to Others  Danger to Others None reported or observed

## 2019-11-20 NOTE — Progress Notes (Signed)
Patient did not attend wrap up group. Pt was notified that group was beginning and she returned to her room.

## 2019-11-20 NOTE — [facility] Group Notes (Signed)
Adult Psychoeducational Group Note  Date:  11/20/2019 Time:  12:43 PM  Group Topic/Focus:  Goals Group:   The focus of this group is to help patients establish daily goals to achieve during treatment and discuss how the patient can incorporate goal setting into their daily lives to aide in recovery.  Participation Level:  Did Not Attend   Julia [name] 11/20/2019, 12:43 PM

## 2019-11-20 NOTE — Progress Notes (Signed)
Adult Psychoeducational Group Note  Date:  11/20/2019 Time:  10:17 AM  Group Topic/Focus:  Goals Group:   The focus of this group is to help patients establish daily goals to achieve during treatment and discuss how the patient can incorporate goal setting into their daily lives to aide in recovery.  Participation Level:  Active  Participation Quality:  Attentive  Affect:  Anxious  Cognitive:  Alert  Insight: Appropriate  Engagement in Group:  Engaged  Modes of Intervention:  Discussion  Additional Comments:  Pt attended group and participated in discussion.  Although pt has been a bit agitated, she was able to hold it together in morning group.  Hassaan Crite R Rachella Basden 11/20/2019, 10:17 AM

## 2019-11-20 NOTE — Progress Notes (Signed)
[facility] MD Progress Note  11/20/2019 11:53 AM Julia [name]  MRN:  [id number]  Subjective: Julia [name] reports, "I'm kind of agitated & slightly hearing voices. I have been here for a month & yet I can't smoke. The nicotine patch does not really work. I'm try to journal right now to help [name] deal with these feelings".".  Objective: [name] with a past psychiatric history significant for schizophrenia, unspecified depression, methamphetamine use disorder in partial remission who was admitted on 10/16/2019 after she had left a group home, and developed suicidal ideation, auditory hallucinations and paranoia. Julia [name] is seen, chart reviewed. The chart findings discussed with the treatment team. She presents alert, oriented & aware of situation. She is visible on the unit, attending group sessions. She says she is kind of feeling agitated & slightly hearing voices. She also complained that she has been in this hospital for 1 month & yet, is not allowed to smoke cigarettes. She rates her anxiety/depression #5, in the scale of (1-10). Ten being the worst anxiety/depression & one being the least anxiety/depression. She denies any suicidal ideations. She still remains somewhat guarded with a flat affect. She is making good eye contact.  Julia [name] at this time continues to maintain her probable baseline. She & the attending psychiatrist/SW had previously discussed the current complications with the social issues, about her housing as well as her guardianship issues.  Her vital signs remain stable, she is afebrile.  She is sleeping well & her appetite remains good. Julia [name] at this time does not appear to be responding to any internal stimuli. And yet, Julia [name] has the tendency to have some outburst from time to time. Her medications has been adjusted to meet her need. She is in agreement to continue her current plan of care as already in progress. Denies any side effects. Resting well. See Tx plan (waiting for group  home placement at this time).  Principal Problem: Schizophrenia, paranoid ([place])  Diagnosis: Principal Problem:   Schizophrenia, paranoid ([place]) Active Problems:   Depressed mood   Psychosis ([place])   MDD (major depressive disorder)  Total Time spent with patient: 15 minutes  Past Psychiatric History: See admission H&P  Past Medical History:  Past Medical History:  Diagnosis Date  . Burning with urination 05/03/2015  . Contraceptive management 05/03/2015  . Depression   . Dysmenorrhea 12/30/2013  . Heroin addiction ([place])   . Menorrhagia 12/30/2013  . Menstrual extraction 12/30/2013  . Migraines   . Social anxiety disorder 09/25/2016  . Suicidal ideations   . Vaginal odor 05/03/2015    Past Surgical History:  Procedure Laterality Date  . NO PAST SURGERIES     Family History:  Family History  Problem Relation Age of Onset  . Depression Mother   . Hypertension Father   . Hyperlipidemia Father   . Cancer Paternal Grandmother        breast, uterine  . Cirrhosis Paternal Grandfather        due to alcohol   Family Psychiatric  History: See admission H&P  Social History:  Social History   Substance and Sexual Activity  Alcohol Use Yes   Comment: BAC was clear     Social History   Substance and Sexual Activity  Drug Use Yes  . Types: Marijuana, Oxycodone   Comment: reports oxycodone and marijuana    Social History   Socioeconomic History  . Marital status: Single    Spouse name: Not on file  . Number of  children: Not on file  . Years of education: Not on file  . Highest education level: Not on file  Occupational History  . Occupation: Unemployed  Tobacco Use  . Smoking status: Current Every Day Smoker    Packs/day: 0.50    Types: Cigarettes  . Smokeless tobacco: Never Used  . Tobacco comment: Not ready to quit  Substance and Sexual Activity  . Alcohol use: Yes    Comment: BAC was clear  . Drug use: Yes    Types: Marijuana, Oxycodone    Comment: reports  oxycodone and marijuana  . Sexual activity: Yes    Birth control/protection: Implant  Other Topics Concern  . Not on file  Social History Narrative   06/23/2019:  Pt stated that she is homeless, that she is a high school graduate, and that she is unemployed and not followed by any outpatient provider.      Lives with Dad. Mom has LGD, lives in [place]. 11th grader. Dog.   Social Determinants of Health   Financial Resource Strain:   . Difficulty of Paying Living Expenses:   Food Insecurity:   . Worried About Charity fundraiser in the Last Year:   . Arboriculturist in the Last Year:   Transportation Needs:   . Film/video editor (Medical):   [name] Lack of Transportation (Non-Medical):   Physical Activity:   . Days of Exercise per Week:   . Minutes of Exercise per Session:   Stress:   . Feeling of Stress :   Social Connections:   . Frequency of Communication with Friends and Family:   . Frequency of Social Gatherings with Friends and Family:   . Attends Religious Services:   . Active Member of Clubs or Organizations:   . Attends Archivist Meetings:   [name] Marital Status:    Additional Social History:  Pain Medications: Please see MAR Prescriptions: Please see MAR, prozac and cogentin per pt Over the Counter: Please see MAR History of alcohol / drug use?: Yes Longest period of sobriety (when/how long): Unknown Negative Consequences of Use: Legal Name of Substance 1: alcohol 1 - Frequency: UTA 1 - Duration: UTA- pt denies 1 - Last Use / Amount: UTA  Sleep: Good  Appetite:  Good  Current Medications: Current Facility-Administered Medications  Medication Dose Route Frequency Provider Last Rate Last Admin  . acetaminophen (TYLENOL) tablet 650 mg  650 mg Oral Q6H PRN Emmaline Kluver, FNP      . alum & mag hydroxide-simeth (MAALOX/MYLANTA) 200-200-20 MG/5ML suspension 30 mL  30 mL Oral Q4H PRN Emmaline Kluver, FNP      . benztropine (COGENTIN) tablet 1 mg  1 mg Oral BID PRN  Cobos, Myer Peer, MD   1 mg at 11/19/19 2127  . FLUoxetine (PROZAC) capsule 60 mg  60 mg Oral Daily Sharma Covert, MD   60 mg at 11/20/19 0805  . gabapentin (NEURONTIN) capsule 400 mg  400 mg Oral TID Sharma Covert, MD   400 mg at 11/20/19 0805  . haloperidol (HALDOL) tablet 5 mg  5 mg Oral Q6H PRN Cobos, Myer Peer, MD   5 mg at 11/20/19 0901   Or  . haloperidol lactate (HALDOL) injection 5 mg  5 mg Intramuscular Q6H PRN Cobos, Myer Peer, MD      . LORazepam (ATIVAN) tablet 1 mg  1 mg Oral Q6H PRN Lindell Spar I, NP   1 mg at 11/19/19 1825  Or  . LORazepam (ATIVAN) injection 1 mg  1 mg Intramuscular Q6H PRN Carrolyn Hilmes I, NP      . magnesium hydroxide (MILK OF MAGNESIA) suspension 30 mL  30 mL Oral Daily PRN Emmaline Kluver, FNP      . nicotine (NICODERM CQ - dosed in mg/24 hr) patch 7 mg  7 mg Transdermal Daily Cobos, Myer Peer, MD   7 mg at 11/20/19 0805  . paliperidone (INVEGA) 24 hr tablet 12 mg  12 mg Oral QHS Money, Lowry Ram, FNP   12 mg at 11/19/19 2127  . traZODone (DESYREL) tablet 25 mg  25 mg Oral QHS PRN Cobos, Myer Peer, MD       Lab Results:  No results found for this or any previous visit (from the past 48 hour(s)).  Blood Alcohol level:  Lab Results  Component Value Date   ETH <10 10/16/2019   ETH <10 123456   Metabolic Disorder Labs: Lab Results  Component Value Date   HGBA1C 5.4 10/16/2019   MPG 108.28 10/16/2019   MPG 102.54 05/17/2019   Lab Results  Component Value Date   PROLACTIN 113.0 (H) 11/11/2019   PROLACTIN 152.0 (H) 10/16/2019   Lab Results  Component Value Date   CHOL 138 10/16/2019   TRIG 60 10/16/2019   HDL 36 (L) 10/16/2019   CHOLHDL 3.8 10/16/2019   VLDL 12 10/16/2019   LDLCALC 90 10/16/2019   LDLCALC 96 05/17/2019   Physical Findings: AIMS: Facial and Oral Movements Muscles of Facial Expression: None, normal Lips and Perioral Area: None, normal Jaw: None, normal Tongue: None, normal,Extremity Movements Upper (arms,  wrists, hands, fingers): None, normal Lower (legs, knees, ankles, toes): None, normal, Trunk Movements Neck, shoulders, hips: None, normal, Overall Severity Severity of abnormal movements (highest score from questions above): None, normal Incapacitation due to abnormal movements: None, normal Patient's awareness of abnormal movements (rate only patient's report): No Awareness, Dental Status Current problems with teeth and/or dentures?: No Does patient usually wear dentures?: No  CIWA:  CIWA-Ar Total: 1 COWS:  COWS Total Score: 2  Musculoskeletal: Strength & Muscle Tone: within normal limits Gait & Station: normal Patient leans: N/A  Psychiatric Specialty Exam: Physical Exam  Nursing note and vitals reviewed. Constitutional: She is oriented to person, place, and time. She appears well-developed and well-nourished.  HENT:  Head: Normocephalic and atraumatic.  Cardiovascular:  Elevated pulse rate: 104  Respiratory: Effort normal.  Genitourinary:    Genitourinary Comments: Deferred   Musculoskeletal:        General: Normal range of motion.     Cervical back: Normal range of motion.  Neurological: She is alert and oriented to person, place, and time.  Skin: Skin is warm.    Review of Systems  Constitutional: Negative for chills, diaphoresis and fever.  HENT: Negative for congestion, rhinorrhea, sneezing and sore throat.   Eyes: Negative for discharge.  Respiratory: Negative for cough, chest tightness, shortness of breath and wheezing.   Cardiovascular: Negative for chest pain and palpitations.  Gastrointestinal: Negative for diarrhea, nausea and vomiting.  Genitourinary: Negative for difficulty urinating.  Musculoskeletal: Negative.   Allergic/Immunologic: Negative for environmental allergies and food allergies.       Allergies: Abilify, Sertraline (Zoloft)  Neurological: Negative for dizziness, tremors, seizures, syncope, light-headedness and headaches.   Psychiatric/Behavioral: Positive for dysphoric mood ("Improving"). Negative for agitation, behavioral problems, confusion, decreased concentration, hallucinations, self-injury, sleep disturbance and suicidal ideas. The patient is nervous/anxious. The patient is not hyperactive.  Blood pressure 100/62, pulse (!) 118, temperature (!) 97.5 F (36.4 C), temperature source Oral, resp. rate 18, height 5\' 3"  (1.6 m), weight 67.8 kg, SpO2 100 %.Body mass index is 26.48 kg/m.  General Appearance: Casual  Eye Contact:  Fair  Speech:  Normal Rate  Volume:  Decreased  Mood:  Anxious and Irritable,   Affect:  Flat  Thought Process:  Coherent and Descriptions of Associations: Intact  Orientation:  Full (Time, Place, and Person)  Thought Content:  Hallucinations: Auditory and Paranoid Ideation  Suicidal Thoughts:  No  Homicidal Thoughts:  No  Memory:  Immediate;   Fair Recent;   Fair Remote;   Fair  Judgement:  Intact  Insight:  Fair  Psychomotor Activity:  Decreased  Concentration:  Concentration: Fair and Attention Span: Fair  Recall:  [employer] of Knowledge:  Fair  Language:  Good  Akathisia:  Negative  Handed:  Right  AIMS (if indicated):     Assets:  Desire for Improvement Resilience  ADL's:  Intact  Cognition:  WNL  Sleep:  Number of Hours: 6.75   Treatment Plan Summary: Daily contact with patient to assess and evaluate symptoms and progress in treatment, Medication management and Plan : Patient is seen and examined.  [name] with the above-stated past psychiatric history who is seen in follow-up.   - Continue inpatient hospitalization. - Will continue today 11/20/2019 plan as below except where it is noted.  Diagnosis:  #1 psychosis: Schizoaffective disorder versus schizophrenia versus substance-induced psychotic disorder,  #2 methamphetamine dependence in partial remission,  #3 unspecified depression,  #4 urinary tract infection (resolved)  Patient  is seen for follow-up care assessment.  She is basically at her baseline.  She denies any auditory hallucinations, delusions/paranoia. Denies suicidal or homicidal ideations.  She denies any side effects to her current medications.  Repeated urinalysis showed essentially normal urine with no evidence of infection.  Social issues continue with placement and guardianship.  No change in her medications today.  1.  Continue Cogentin 1 mg p.o. twice daily as needed tremors and EPS. 2.  Continue fluoxetine 60 mg p.o. daily for depression and anxiety. 3.  Continue gabapentin 400 mg p.o. 3 times daily for anxiety and mood stability. 4.  Continue Haldol as needed & lorazepam as needed for agitation. 5.  Continue paliperidone to12 mg p.o. daily for psychosis/mood control. 6.  Continue trazodone to 25 mg p.o. nightly as needed insomnia. 7.  Disposition planning-in progress. 8.  Patient continues to attend group sessions.  Lindell Spar, NP, PMHNP, FNP-BC  11/20/2019, 11:53 AMPatient ID: Julia [name], female   DOB: [birth year], 19 y.o.MRN: [id number] Patient ID: Julia [name], female   DOB: 08-24-2000, 19 y.o.   MRN: [id number] Patient ID: Julia [name], female   DOB: [birth year], 19 y.o.   MRN: [id number] Patient ID: Julia [name], female   DOB: 12-04-2000, 19 y.o.   MRN: [id number] Patient ID: Julia [name], female   DOB: [birth year], 19 y.o.   MRN: [id number]

## 2019-11-20 NOTE — Progress Notes (Signed)
Pt vomited this afternoon in the dayroom. She reported that she choked on a goldfish cracker. Zofran offered, pt declined. Pt does not appear to be in respiratory distress.

## 2019-11-20 NOTE — Progress Notes (Signed)
Pt is requesting to have Invega increased at bedtime.

## 2019-11-20 NOTE — [facility] Group Notes (Signed)
LCSW Group Therapy Note  11/20/2019   10:00-11:00am   Type of Therapy and Topic:  Group Therapy: Anger Cues and Responses  Participation Level:  Minimal   Description of Group:   In this group, patients learned how to recognize the physical, cognitive, emotional, and behavioral responses they have to anger-provoking situations.  They identified a recent time they became angry and how they reacted.  They analyzed how their reaction was possibly beneficial and how it was possibly unhelpful.  The group discussed a variety of healthier coping skills that could help with such a situation in the future.  Focus was placed on how helpful it is to recognize the underlying emotions to our anger, because working on those can lead to a more permanent solution as well as our ability to focus on the important rather than the urgent.  Therapeutic Goals: 1. Patients will remember their last incident of anger and how they felt emotionally and physically, what their thoughts were at the time, and how they behaved. 2. Patients will identify how their behavior at that time worked for them, as well as how it worked against them. 3. Patients will explore possible new behaviors to use in future anger situations. 4. Patients will learn that anger itself is normal and cannot be eliminated, and that healthier reactions can assist with resolving conflict rather than worsening situations.  Summary of Patient Progress:  The patient shared that feeling frustrated that she has been in hospital so long. She appeared to being responding to internal stimuli and ended up leaving group early.   Therapeutic Modalities:   Cognitive Behavioral Therapy  Rolanda Jay

## 2019-11-20 NOTE — Progress Notes (Signed)
   11/20/19 2238  COVID-19 Daily Checkoff  Have you had a fever (temp > 37.80C/100F)  in the past 24 hours?  No  COVID-19 EXPOSURE  Have you traveled outside the state in the past 14 days? No  Have you been in contact with someone with a confirmed diagnosis of COVID-19 or PUI in the past 14 days without wearing appropriate PPE? No  Have you been living in the same home as a person with confirmed diagnosis of COVID-19 or a PUI (household contact)? No  Have you been diagnosed with COVID-19? No

## 2019-11-21 NOTE — Progress Notes (Signed)
   11/21/19 2044  COVID-19 Daily Checkoff  Have you had a fever (temp > 37.80C/100F)  in the past 24 hours?  No  If you have had runny nose, nasal congestion, sneezing in the past 24 hours, has it worsened? No  COVID-19 EXPOSURE  Have you traveled outside the state in the past 14 days? No  Have you been in contact with someone with a confirmed diagnosis of COVID-19 or PUI in the past 14 days without wearing appropriate PPE? No  Have you been living in the same home as a person with confirmed diagnosis of COVID-19 or a PUI (household contact)? No  Have you been diagnosed with COVID-19? No

## 2019-11-21 NOTE — Progress Notes (Signed)
At the time of this encounter, pt was sleeping in her bed. No respiratory distress was noted. Pt didn't express any concerns, she said that "I'm tired." Pt expressed the need to rest with her lights turned off. She didn't report feeling agitated and no signs were noted.   Medications were administered as ordered by MD. Q 15 minute safety checks continue.     11/20/19 2238  Psych Admission Type (Psych Patients Only)  Admission Status Involuntary  Psychosocial Assessment  Patient Complaints Other (Comment) (pt was sleeping and when awakened said "I'm tired")  Eye Contact Brief  Facial Expression Flat  Affect Appropriate to circumstance;Flat  Speech Logical/coherent  Interaction Minimal  Motor Activity Other (Comment) (WNL)  Appearance/Hygiene Unremarkable  Behavior Characteristics Appropriate to situation;Cooperative  Mood Sad  Thought Process  Coherency WDL  Content WDL  Delusions Paranoid  Perception WDL  Hallucination None reported or observed  Judgment Limited  Confusion None  Danger to Self  Current suicidal ideation? Denies  Danger to Others  Danger to Others None reported or observed

## 2019-11-21 NOTE — [facility] Group Notes (Signed)
[place] LCSW Group Therapy Note  Date/Time:  11/21/2019 9:00-10:00 or 10:00-11:00AM  Type of Therapy and Topic:  Group Therapy:  Healthy and Unhealthy Supports  Participation Level:  Minimal   Description of Group:  Patients in this group were introduced to the idea of adding a variety of healthy supports to address the various needs in their lives.Patients discussed what additional healthy supports could be helpful in their recovery and wellness after discharge in order to prevent future hospitalizations.   An emphasis was placed on using counselor, doctor, therapy groups, 12-step groups, and problem-specific support groups to expand supports.  They also worked as a group on developing a specific plan for several patients to deal with unhealthy supports through [place], psychoeducation with loved ones, and even termination of relationships.   Therapeutic Goals:   1)  discuss importance of adding supports to stay well once out of the hospital  2)  compare healthy versus unhealthy supports and identify some examples of each  3)  generate ideas and descriptions of healthy supports that can be added  4)  offer mutual support about how to address unhealthy supports  5)  encourage active participation in and adherence to discharge plan    Summary of Patient Progress:  The patient stated that current healthy supports in her life are family and her boyfriend while not identifying any unhealthy supports.  The patient then got up and left the day room.    Therapeutic Modalities:   Motivational Interviewing Brief Solution-Focused Therapy  Rolanda Jay

## 2019-11-21 NOTE — Progress Notes (Signed)
[facility] MD Progress Note  11/21/2019 10:58 AM Julia [name]  MRN:  [id number]  Subjective: Julia [name] reports, "I'm doing okay. It is going well today".  Objective: [name] with a past psychiatric history significant for schizophrenia, unspecified depression, methamphetamine use disorder in partial remission who was admitted on 10/16/2019 after she had left a group home, and developed suicidal ideation, auditory hallucinations and paranoia. Julia [name] is seen, chart reviewed. The chart findings discussed with the treatment team. She presents alert, oriented & aware of situation. She is visible on the unit, attending group sessions. She says she is doing okay this morning. Denies any new issues or concerns. She complained yesterdat that she has been in this hospital for 1 month & yet, is not allowed to smoke cigarettes. Today, she says the nicotine patch ot the nicotine gum do not help the cigarette cravings. She rates her anxiety/depression #3, in the scale of (1-10). Ten being the worst anxiety/depression & one being the least anxiety/depression. She denies any suicidal ideations. She still remains somewhat guarded with a flat affect. She is making good eye contact. Julia [name] at this time continues to maintain her probable baseline. She & the attending psychiatrist/SW had previously discussed the current complications with the social issues, about her housing as well as her guardianship issues.  Her vital signs remain stable, she is afebrile.  She is sleeping well & her appetite remains good. Julia [name] at this time does not appear to be responding to any internal stimuli. And yet, Julia [name] has the tendency to have some outburst from time to time. Her medications has been adjusted to meet her need. She is in agreement to continue her current plan of care is already in progress. Denies any side effects. Resting well. See Tx plan (waiting for group home placement at this time).  Principal Problem: Schizophrenia,  paranoid ([place])  Diagnosis: Principal Problem:   Schizophrenia, paranoid ([place]) Active Problems:   Depressed mood   Psychosis ([place])   MDD (major depressive disorder)  Total Time spent with patient: 15 minutes  Past Psychiatric History: See admission H&P  Past Medical History:  Past Medical History:  Diagnosis Date  . Burning with urination 05/03/2015  . Contraceptive management 05/03/2015  . Depression   . Dysmenorrhea 12/30/2013  . Heroin addiction ([place])   . Menorrhagia 12/30/2013  . Menstrual extraction 12/30/2013  . Migraines   . Social anxiety disorder 09/25/2016  . Suicidal ideations   . Vaginal odor 05/03/2015    Past Surgical History:  Procedure Laterality Date  . NO PAST SURGERIES     Family History:  Family History  Problem Relation Age of Onset  . Depression Mother   . Hypertension Father   . Hyperlipidemia Father   . Cancer Paternal Grandmother        breast, uterine  . Cirrhosis Paternal Grandfather        due to alcohol   Family Psychiatric  History: See admission H&P  Social History:  Social History   Substance and Sexual Activity  Alcohol Use Yes   Comment: BAC was clear     Social History   Substance and Sexual Activity  Drug Use Yes  . Types: Marijuana, Oxycodone   Comment: reports oxycodone and marijuana    Social History   Socioeconomic History  . Marital status: Single    Spouse name: Not on file  . Number of children: Not on file  . Years of education: Not on file  . Highest  education level: Not on file  Occupational History  . Occupation: Unemployed  Tobacco Use  . Smoking status: Current Every Day Smoker    Packs/day: 0.50    Types: Cigarettes  . Smokeless tobacco: Never Used  . Tobacco comment: Not ready to quit  Substance and Sexual Activity  . Alcohol use: Yes    Comment: BAC was clear  . Drug use: Yes    Types: Marijuana, Oxycodone    Comment: reports oxycodone and marijuana  . Sexual activity: Yes    Birth  control/protection: Implant  Other Topics Concern  . Not on file  Social History Narrative   06/23/2019:  Pt stated that she is homeless, that she is a high school graduate, and that she is unemployed and not followed by any outpatient provider.      Lives with Dad. Mom has LGD, lives in [place]. 11th grader. Dog.   Social Determinants of Health   Financial Resource Strain:   . Difficulty of Paying Living Expenses:   Food Insecurity:   . Worried About Charity fundraiser in the Last Year:   . Arboriculturist in the Last Year:   Transportation Needs:   . Film/video editor (Medical):   [name] Lack of Transportation (Non-Medical):   Physical Activity:   . Days of Exercise per Week:   . Minutes of Exercise per Session:   Stress:   . Feeling of Stress :   Social Connections:   . Frequency of Communication with Friends and Family:   . Frequency of Social Gatherings with Friends and Family:   . Attends Religious Services:   . Active Member of Clubs or Organizations:   . Attends Archivist Meetings:   [name] Marital Status:    Additional Social History:  Pain Medications: Please see MAR Prescriptions: Please see MAR, prozac and cogentin per pt Over the Counter: Please see MAR History of alcohol / drug use?: Yes Longest period of sobriety (when/how long): Unknown Negative Consequences of Use: Legal Name of Substance 1: alcohol 1 - Frequency: UTA 1 - Duration: UTA- pt denies 1 - Last Use / Amount: UTA  Sleep: Good  Appetite:  Good  Current Medications: Current Facility-Administered Medications  Medication Dose Route Frequency Provider Last Rate Last Admin  . acetaminophen (TYLENOL) tablet 650 mg  650 mg Oral Q6H PRN Emmaline Kluver, FNP      . alum & mag hydroxide-simeth (MAALOX/MYLANTA) 200-200-20 MG/5ML suspension 30 mL  30 mL Oral Q4H PRN Emmaline Kluver, FNP      . benztropine (COGENTIN) tablet 1 mg  1 mg Oral BID PRN Cobos, Myer Peer, MD   1 mg at 11/20/19 1209  . FLUoxetine  (PROZAC) capsule 60 mg  60 mg Oral Daily Sharma Covert, MD   60 mg at 11/21/19 0744  . gabapentin (NEURONTIN) capsule 400 mg  400 mg Oral TID Sharma Covert, MD   400 mg at 11/21/19 0744  . haloperidol (HALDOL) tablet 5 mg  5 mg Oral Q6H PRN Cobos, Myer Peer, MD   5 mg at 11/20/19 0901   Or  . haloperidol lactate (HALDOL) injection 5 mg  5 mg Intramuscular Q6H PRN Cobos, Myer Peer, MD      . LORazepam (ATIVAN) tablet 1 mg  1 mg Oral Q6H PRN Lindell Spar I, NP   1 mg at 11/21/19 1022   Or  . LORazepam (ATIVAN) injection 1 mg  1 mg Intramuscular Q6H PRN  Lindell Spar I, NP      . magnesium hydroxide (MILK OF MAGNESIA) suspension 30 mL  30 mL Oral Daily PRN Emmaline Kluver, FNP      . nicotine (NICODERM CQ - dosed in mg/24 hr) patch 7 mg  7 mg Transdermal Daily Cobos, Myer Peer, MD   7 mg at 11/21/19 0704  . ondansetron (ZOFRAN-ODT) disintegrating tablet 4 mg  4 mg Oral Q8H PRN Aryianna Earwood I, NP      . paliperidone (INVEGA) 24 hr tablet 12 mg  12 mg Oral QHS Money, Lowry Ram, FNP   12 mg at 11/20/19 2238  . traZODone (DESYREL) tablet 25 mg  25 mg Oral QHS PRN Cobos, Myer Peer, MD       Lab Results:  No results found for this or any previous visit (from the past 48 hour(s)).  Blood Alcohol level:  Lab Results  Component Value Date   ETH <10 10/16/2019   ETH <10 123456   Metabolic Disorder Labs: Lab Results  Component Value Date   HGBA1C 5.4 10/16/2019   MPG 108.28 10/16/2019   MPG 102.54 05/17/2019   Lab Results  Component Value Date   PROLACTIN 113.0 (H) 11/11/2019   PROLACTIN 152.0 (H) 10/16/2019   Lab Results  Component Value Date   CHOL 138 10/16/2019   TRIG 60 10/16/2019   HDL 36 (L) 10/16/2019   CHOLHDL 3.8 10/16/2019   VLDL 12 10/16/2019   LDLCALC 90 10/16/2019   LDLCALC 96 05/17/2019   Physical Findings: AIMS: Facial and Oral Movements Muscles of Facial Expression: None, normal Lips and Perioral Area: None, normal Jaw: None, normal Tongue: None,  normal,Extremity Movements Upper (arms, wrists, hands, fingers): None, normal Lower (legs, knees, ankles, toes): None, normal, Trunk Movements Neck, shoulders, hips: None, normal, Overall Severity Severity of abnormal movements (highest score from questions above): None, normal Incapacitation due to abnormal movements: None, normal Patient's awareness of abnormal movements (rate only patient's report): No Awareness, Dental Status Current problems with teeth and/or dentures?: No Does patient usually wear dentures?: No  CIWA:  CIWA-Ar Total: 1 COWS:  COWS Total Score: 2  Musculoskeletal: Strength & Muscle Tone: within normal limits Gait & Station: normal Patient leans: N/A  Psychiatric Specialty Exam: Physical Exam  Nursing note and vitals reviewed. Constitutional: She is oriented to person, place, and time. She appears well-developed and well-nourished.  HENT:  Head: Normocephalic and atraumatic.  Cardiovascular:  Elevated pulse rate: 104  Respiratory: Effort normal.  Genitourinary:    Genitourinary Comments: Deferred   Musculoskeletal:        General: Normal range of motion.     Cervical back: Normal range of motion.  Neurological: She is alert and oriented to person, place, and time.  Skin: Skin is warm.    Review of Systems  Constitutional: Negative for chills, diaphoresis and fever.  HENT: Negative for congestion, rhinorrhea, sneezing and sore throat.   Eyes: Negative for discharge.  Respiratory: Negative for cough, chest tightness, shortness of breath and wheezing.   Cardiovascular: Negative for chest pain and palpitations.  Gastrointestinal: Negative for diarrhea, nausea and vomiting.  Genitourinary: Negative for difficulty urinating.  Musculoskeletal: Negative.   Allergic/Immunologic: Negative for environmental allergies and food allergies.       Allergies: Abilify, Sertraline (Zoloft)  Neurological: Negative for dizziness, tremors, seizures, syncope,  light-headedness and headaches.  Psychiatric/Behavioral: Positive for dysphoric mood ("Improving"). Negative for agitation, behavioral problems, confusion, decreased concentration, hallucinations, self-injury, sleep disturbance and suicidal ideas. The  patient is nervous/anxious. The patient is not hyperactive.     Blood pressure (!) 92/54, pulse (!) 121, temperature 97.6 F (36.4 C), temperature source Oral, resp. rate 18, height 5\' 3"  (1.6 m), weight 67.8 kg, SpO2 100 %.Body mass index is 26.48 kg/m.  General Appearance: Casual  Eye Contact:  Fair  Speech:  Normal Rate  Volume:  Decreased  Mood:  Anxious and Irritable,   Affect:  Flat  Thought Process:  Coherent and Descriptions of Associations: Intact  Orientation:  Full (Time, Place, and Person)  Thought Content:  Hallucinations: Auditory and Paranoid Ideation  Suicidal Thoughts:  No  Homicidal Thoughts:  No  Memory:  Immediate;   Fair Recent;   Fair Remote;   Fair  Judgement:  Intact  Insight:  Fair  Psychomotor Activity:  Decreased  Concentration:  Concentration: Fair and Attention Span: Fair  Recall:  [employer] of Knowledge:  Fair  Language:  Good  Akathisia:  Negative  Handed:  Right  AIMS (if indicated):     Assets:  Desire for Improvement Resilience  ADL's:  Intact  Cognition:  WNL  Sleep:  Number of Hours: 6   Treatment Plan Summary: Daily contact with patient to assess and evaluate symptoms and progress in treatment, Medication management and Plan : Patient is seen and examined.  [name] with the above-stated past psychiatric history who is seen in follow-up.   - Continue inpatient hospitalization. - Will continue today 11/21/2019 plan as below except where it is noted.  Diagnosis:  #1 psychosis: Schizoaffective disorder versus schizophrenia versus substance-induced psychotic disorder,  #2 methamphetamine dependence in partial remission,  #3 unspecified depression,  #4 urinary tract  infection (resolved)  Patient is seen for follow-up care assessment.  She is basically at her baseline.  She denies any auditory hallucinations, delusions/paranoia. Denies suicidal or homicidal ideations.  She denies any side effects to her current medications.  Repeated urinalysis showed essentially normal urine with no evidence of infection.  Social issues continue with placement and guardianship.  No change in her medications today.  1.  Continue Cogentin 1 mg p.o. twice daily as needed tremors and EPS. 2.  Continue fluoxetine 60 mg p.o. daily for depression and anxiety. 3.  Continue gabapentin 400 mg p.o. 3 times daily for anxiety and mood stability. 4.  Continue Haldol as needed & lorazepam as needed for agitation. 5.  Continue paliperidone to12 mg p.o. daily for psychosis/mood control. 6.  Continue trazodone to 25 mg p.o. nightly as needed insomnia. 7.  Disposition planning-in progress. 8.  Patient continues to attend group sessions.  Lindell Spar, NP, PMHNP, FNP-BC 11/21/2019, 10:58 AMPatient ID: Julia [name], female   DOB: [birth year], 19 y.o.MRN: [id number] Patient ID: Julia [name], female   DOB: January 26, 2001, 19 y.o.   MRN: [id number] Patient ID: Julia [name], female   DOB: [birth year], 19 y.o.   MRN: [id number] Patient ID: Julia [name], female   DOB: February 13, 2001, 19 y.o.   MRN: [id number] Patient ID: Julia [name], female   DOB: 02-Jul-2001, 19 y.o.   MRN: [id number] Patient ID: Julia [name], female   DOB: 09-10-2000, 19 y.o.   MRN: [id number]

## 2019-11-21 NOTE — Progress Notes (Signed)
   11/21/19 0800  Psych Admission Type (Psych Patients Only)  Admission Status Involuntary  Psychosocial Assessment  Patient Complaints None  Eye Contact Brief  Facial Expression Flat  Affect Appropriate to circumstance;Flat  Speech Logical/coherent  Interaction Minimal  Motor Activity Slow  Appearance/Hygiene Unremarkable  Behavior Characteristics Appropriate to situation  Mood Anxious  Aggressive Behavior  Effect No apparent injury  Thought Process  Coherency Concrete thinking  Content WDL  Delusions Paranoid  Perception Hallucinations  Hallucination Auditory;Visual  Judgment Limited  Confusion None  Danger to Self  Current suicidal ideation? Denies  Self-Injurious Behavior No self-injurious ideation or behavior indicators observed or expressed   Danger to Others  Danger to Others None reported or observed

## 2019-11-21 NOTE — Progress Notes (Signed)
Pt vomited this evening after taking Ativan. Zofran administered to the pt for nausea. Will continue to monitor pt.

## 2019-11-21 NOTE — Progress Notes (Signed)
   11/21/19 2047  Psych Admission Type (Psych Patients Only)  Admission Status Involuntary  Psychosocial Assessment  Patient Complaints Anxiety  Eye Contact Brief  Facial Expression Flat  Affect Appropriate to circumstance  Speech Logical/coherent  Interaction Minimal  Motor Activity Slow  Appearance/Hygiene Unremarkable  Behavior Characteristics Appropriate to situation  Mood Anxious  Thought Process  Coherency Concrete thinking  Content WDL  Delusions Paranoid  Perception Hallucinations  Hallucination Auditory;Visual  Judgment Limited  Confusion None  Danger to Self  Current suicidal ideation? Denies  Self-Injurious Behavior No self-injurious ideation or behavior indicators observed or expressed   Agreement Not to Harm Self Yes  Description of Agreement verbally contracts for safety  Danger to Others  Danger to Others None reported or observed

## 2019-11-21 NOTE — Progress Notes (Signed)
Pt did not attend group. 

## 2019-11-22 NOTE — Progress Notes (Signed)
Recreation Therapy Notes  Date: 5.10.21 Time: 0930 Location: [address] Dayroom  Group Topic: Stress Management  Goal Area(s) Addresses:  Patient will identify positive stress management techniques. Patient will identify benefits of using stress management post d/c.  Behavioral Response: Engaged  Intervention: Stress Management  Activity:  Meditation.  LRT played a meditation that focused on letting go of the past and focusing on the future.  Patients were to listen and follow along as meditation played to full engage in activity.    Education:  Stress Management, Discharge Planning.   Education Outcome: Acknowledges Education  Clinical Observations/Feedback:  Pt attended and participated in activity.     [name], LRT/CTRS    [name] A 11/22/2019 10:58 AM

## 2019-11-22 NOTE — Progress Notes (Signed)
Spiritual care group on grief and loss facilitated by chaplain Jerene Pitch MDiv, BCC  Group Goal:  Support / Education around grief and loss Members engage in facilitated group support and psycho-social education.  Group Description:  Following introductions and group rules, group members engaged in facilitated group dialog and support around topic of loss, with particular support around experiences of loss in their lives. Group Identified types of loss (relationships / self / things) and identified patterns, circumstances, and changes that precipitate losses. Reflected on thoughts / feelings around loss, normalized grief responses, and recognized variety in grief experience.   Group noted Worden's four tasks of grief in discussion.  Group drew on Adlerian / Rogerian, narrative, MI, Patient Progress:  Julia [name] was present throughout group.  Oriented to topic, she volunteered that her mother had died and that yesterday was complicated for her due to being mother's day.  She related that she had grieved the death of her mother, but certain days still have impact.  Members of group provided listening presence and support while [name] related her experience yesterday.

## 2019-11-22 NOTE — Progress Notes (Signed)
Patient signed 72 hour request for discharge on 11/22/2019 at 3:55 p.m.  Patient wants to discharge tomorrow after she was told there is a house where she can stay.

## 2019-11-22 NOTE — BHH Counselor (Signed)
Patient is aware that her group home placement fell through due to financial reasons (Group Home did not feel comfortable accepting an LOG). No other group homes have offered placement or further screenings for patient.  CSW discussed [employer] with patient. Patient reports she is agreeable but has some hesitancies as she was at [facility] in the past. Patient also inquired about follow up and medication management. [place] also serves [facility] and could transfer services for patient.  Patient inquired about [facility] in [place]. Patient called and left a message with call back information and her code. CSW called and left a similar message. Patient's ACT Team does not serve [facility], so they could not follow patient if a bed became available at [facility].  [name], MSW, LCSW-A Clinical Social Worker [facility]

## 2019-11-22 NOTE — Progress Notes (Signed)
   11/22/19 2100  Psych Admission Type (Psych Patients Only)  Admission Status Voluntary  Psychosocial Assessment  Patient Complaints Anxiety  Eye Contact Brief  Facial Expression Flat  Affect Appropriate to circumstance  Speech Logical/coherent  Interaction Minimal  Motor Activity Slow  Appearance/Hygiene Unremarkable  Behavior Characteristics Anxious  Mood Depressed  Thought Process  Coherency Concrete thinking  Content WDL  Delusions Paranoid  Perception Hallucinations  Hallucination Auditory;Visual  Judgment Limited  Confusion None  Danger to Self  Current suicidal ideation? Denies  Self-Injurious Behavior No self-injurious ideation or behavior indicators observed or expressed   Agreement Not to Harm Self Yes  Description of Agreement verbally contracts for safety  Danger to Others  Danger to Others None reported or observed

## 2019-11-22 NOTE — Progress Notes (Signed)
D:  Patient's self inventory sheet, patient sleeps good, no sleep medication.  Good appetite, normal energy level, good concentration.  Denied depression, hopeless and anxiety.  Denied withdrawals.  Denied SI.  Denied physical problems.  Denied physical pain.  Patient does have discharge plans. A:  Medications administered per MD orders.  Emotional support and encouragement given patient. R:  Denied SI and HI, contracts for safety.  Denied A/V hallucinations.  Safety maintained with 15 minute checks.

## 2019-11-22 NOTE — Progress Notes (Signed)
[facility] MD Progress Note  11/22/2019 8:58 AM Julia [name]  MRN:  [id number]  Subjective: patient reports improvement compared to admission. Reports , regarding thought insertion delusions /fixations she had presented with initially " I no longer feel that, it's not happening ". She does report some residual auditory and occasional visual hallucinations, states she hears voices that are sometimes critical, sometimes distant. Denies command hallucinations. Denies medication side effects. Denies suicidal or self injurious ideations .  Objective: Case reviewed with treatment team and patient seen. 19 year old female, presented on 4/3 for depression and psychotic symptoms which she described as being mentally tortured by a person who was transmitting thoughts to her.She also describes some auditory hallucinations. She has been diagnosed with schizophrenia.  At this time patient presents alert, attentive, polite/vaguely anxious on approach. She reports, as above, that thought insertion thoughts/delusions have subsided/resolved but endorses some lingering, although improved, hallucinations. Currently not internally preoccupied . No delusions currently expressed .  Affect remains blunted, but to a lesser degree than on admission. Also reports she has been talking to other patients in day room / not isolating . Denies SI and presents future oriented, focused mostly on disposition planning  Currently denies medication side effects- at this time denies dizziness or lightheadedness  CSW staff report that group home did not accept patient and they are now looking into other disposition options .   Principal Problem: Schizophrenia, paranoid ([place])  Diagnosis: Principal Problem:   Schizophrenia, paranoid ([place]) Active Problems:   Depressed mood   Psychosis ([place])   MDD (major depressive disorder)  Total Time spent with patient: 15 minutes  Past Psychiatric History: See admission H&P  Past Medical  History:  Past Medical History:  Diagnosis Date  . Burning with urination 05/03/2015  . Contraceptive management 05/03/2015  . Depression   . Dysmenorrhea 12/30/2013  . Heroin addiction ([place])   . Menorrhagia 12/30/2013  . Menstrual extraction 12/30/2013  . Migraines   . Social anxiety disorder 09/25/2016  . Suicidal ideations   . Vaginal odor 05/03/2015    Past Surgical History:  Procedure Laterality Date  . NO PAST SURGERIES     Family History:  Family History  Problem Relation Age of Onset  . Depression Mother   . Hypertension Father   . Hyperlipidemia Father   . Cancer Paternal Grandmother        breast, uterine  . Cirrhosis Paternal Grandfather        due to alcohol   Family Psychiatric  History: See admission H&P  Social History:  Social History   Substance and Sexual Activity  Alcohol Use Yes   Comment: BAC was clear     Social History   Substance and Sexual Activity  Drug Use Yes  . Types: Marijuana, Oxycodone   Comment: reports oxycodone and marijuana    Social History   Socioeconomic History  . Marital status: Single    Spouse name: Not on file  . Number of children: Not on file  . Years of education: Not on file  . Highest education level: Not on file  Occupational History  . Occupation: Unemployed  Tobacco Use  . Smoking status: Current Every Day Smoker    Packs/day: 0.50    Types: Cigarettes  . Smokeless tobacco: Never Used  . Tobacco comment: Not ready to quit  Substance and Sexual Activity  . Alcohol use: Yes    Comment: BAC was clear  . Drug use: Yes    Types: Marijuana, Oxycodone  Comment: reports oxycodone and marijuana  . Sexual activity: Yes    Birth control/protection: Implant  Other Topics Concern  . Not on file  Social History Narrative   06/23/2019:  Pt stated that she is homeless, that she is a high school graduate, and that she is unemployed and not followed by any outpatient provider.      Lives with Dad. Mom has LGD,  lives in [place]. 11th grader. Dog.   Social Determinants of Health   Financial Resource Strain:   . Difficulty of Paying Living Expenses:   Food Insecurity:   . Worried About Charity fundraiser in the Last Year:   . Arboriculturist in the Last Year:   Transportation Needs:   . Film/video editor (Medical):   [name] Lack of Transportation (Non-Medical):   Physical Activity:   . Days of Exercise per Week:   . Minutes of Exercise per Session:   Stress:   . Feeling of Stress :   Social Connections:   . Frequency of Communication with Friends and Family:   . Frequency of Social Gatherings with Friends and Family:   . Attends Religious Services:   . Active Member of Clubs or Organizations:   . Attends Archivist Meetings:   [name] Marital Status:    Additional Social History:  Pain Medications: Please see MAR Prescriptions: Please see MAR, prozac and cogentin per pt Over the Counter: Please see MAR History of alcohol / drug use?: Yes Longest period of sobriety (when/how long): Unknown Negative Consequences of Use: Legal Name of Substance 1: alcohol 1 - Frequency: UTA 1 - Duration: UTA- pt denies 1 - Last Use / Amount: UTA  Sleep: Good  Appetite:  Good  Current Medications: Current Facility-Administered Medications  Medication Dose Route Frequency Provider Last Rate Last Admin  . acetaminophen (TYLENOL) tablet 650 mg  650 mg Oral Q6H PRN Emmaline Kluver, FNP      . alum & mag hydroxide-simeth (MAALOX/MYLANTA) 200-200-20 MG/5ML suspension 30 mL  30 mL Oral Q4H PRN Emmaline Kluver, FNP      . benztropine (COGENTIN) tablet 1 mg  1 mg Oral BID PRN Sanaiyah Kirchhoff, Myer Peer, MD   1 mg at 11/21/19 1146  . FLUoxetine (PROZAC) capsule 60 mg  60 mg Oral Daily Sharma Covert, MD   60 mg at 11/22/19 0849  . gabapentin (NEURONTIN) capsule 400 mg  400 mg Oral TID Sharma Covert, MD   400 mg at 11/22/19 0850  . haloperidol (HALDOL) tablet 5 mg  5 mg Oral Q6H PRN Maguire Sime, Myer Peer, MD   5 mg at  11/21/19 2158   Or  . haloperidol lactate (HALDOL) injection 5 mg  5 mg Intramuscular Q6H PRN Letica Giaimo, Myer Peer, MD      . LORazepam (ATIVAN) tablet 1 mg  1 mg Oral Q6H PRN Lindell Spar I, NP   1 mg at 11/21/19 1848   Or  . LORazepam (ATIVAN) injection 1 mg  1 mg Intramuscular Q6H PRN Nwoko, Agnes I, NP      . magnesium hydroxide (MILK OF MAGNESIA) suspension 30 mL  30 mL Oral Daily PRN Emmaline Kluver, FNP      . nicotine (NICODERM CQ - dosed in mg/24 hr) patch 7 mg  7 mg Transdermal Daily Parul Porcelli, Myer Peer, MD   7 mg at 11/22/19 0849  . ondansetron (ZOFRAN-ODT) disintegrating tablet 4 mg  4 mg Oral Q8H PRN Encarnacion Slates,  NP   4 mg at 11/21/19 1858  . paliperidone (INVEGA) 24 hr tablet 12 mg  12 mg Oral QHS Money, Lowry Ram, FNP   12 mg at 11/21/19 2158  . traZODone (DESYREL) tablet 25 mg  25 mg Oral QHS PRN Takyra Cantrall, Myer Peer, MD       Lab Results:  No results found for this or any previous visit (from the past 48 hour(s)).  Blood Alcohol level:  Lab Results  Component Value Date   ETH <10 10/16/2019   ETH <10 123456   Metabolic Disorder Labs: Lab Results  Component Value Date   HGBA1C 5.4 10/16/2019   MPG 108.28 10/16/2019   MPG 102.54 05/17/2019   Lab Results  Component Value Date   PROLACTIN 113.0 (H) 11/11/2019   PROLACTIN 152.0 (H) 10/16/2019   Lab Results  Component Value Date   CHOL 138 10/16/2019   TRIG 60 10/16/2019   HDL 36 (L) 10/16/2019   CHOLHDL 3.8 10/16/2019   VLDL 12 10/16/2019   LDLCALC 90 10/16/2019   LDLCALC 96 05/17/2019   Physical Findings: AIMS: Facial and Oral Movements Muscles of Facial Expression: None, normal Lips and Perioral Area: None, normal Jaw: None, normal Tongue: None, normal,Extremity Movements Upper (arms, wrists, hands, fingers): None, normal Lower (legs, knees, ankles, toes): None, normal, Trunk Movements Neck, shoulders, hips: None, normal, Overall Severity Severity of abnormal movements (highest score from questions above):  None, normal Incapacitation due to abnormal movements: None, normal Patient's awareness of abnormal movements (rate only patient's report): No Awareness, Dental Status Current problems with teeth and/or dentures?: No Does patient usually wear dentures?: No  CIWA:  CIWA-Ar Total: 1 COWS:  COWS Total Score: 2  Musculoskeletal: Strength & Muscle Tone: within normal limits Gait & Station: normal Patient leans: N/A  Psychiatric Specialty Exam: Physical Exam  Nursing note and vitals reviewed. Constitutional: She is oriented to person, place, and time. She appears well-developed and well-nourished.  HENT:  Head: Normocephalic and atraumatic.  Cardiovascular:  Elevated pulse rate: 104  Respiratory: Effort normal.  Genitourinary:    Genitourinary Comments: Deferred   Musculoskeletal:        General: Normal range of motion.     Cervical back: Normal range of motion.  Neurological: She is alert and oriented to person, place, and time.  Skin: Skin is warm.    Review of Systems  Constitutional: Negative for chills, diaphoresis and fever.  HENT: Negative for congestion, rhinorrhea, sneezing and sore throat.   Eyes: Negative for discharge.  Respiratory: Negative for cough, chest tightness, shortness of breath and wheezing.   Cardiovascular: Negative for chest pain and palpitations.  Gastrointestinal: Negative for diarrhea, nausea and vomiting.  Genitourinary: Negative for difficulty urinating.  Musculoskeletal: Negative.   Allergic/Immunologic: Negative for environmental allergies and food allergies.       Allergies: Abilify, Sertraline (Zoloft)  Neurological: Negative for dizziness, tremors, seizures, syncope, light-headedness and headaches.  Psychiatric/Behavioral: Positive for dysphoric mood ("Improving"). Negative for agitation, behavioral problems, confusion, decreased concentration, hallucinations, self-injury, sleep disturbance and suicidal ideas. The patient is nervous/anxious. The  patient is not hyperactive.   Currently denies dizziness or lightheadedness, no chest pain, no shortness of breath, no vomiting   Blood pressure (!) 92/54, pulse (!) 121, temperature 97.6 F (36.4 C), temperature source Oral, resp. rate 18, height 5\' 3"  (1.6 m), weight 67.8 kg, SpO2 100 %.Body mass index is 26.48 kg/m.  General Appearance: Casual  Eye Contact:  Good  Speech:  Normal Rate  Volume:  Normal  Mood:  reports mood is "OK",   Affect:  less blulnted, but remains restricted   Thought Process:  Linear and Descriptions of Associations: Circumstantial  Orientation:  Other:  fully alert and attentive  Thought Content:  improved thought insertion delusional ideations, reports some ongoing hallucinations, does not appear internally preoccupied at this time   Suicidal Thoughts:  No denies SI or self injurious ideations  Homicidal Thoughts:  No  Memory:  recent and remote grossly intact   Judgement:  Other:  improving   Insight:  Fair/ improving   Psychomotor Activity:  improving   Concentration:  Concentration: Good and Attention Span: Good  Recall:  Good  Fund of Knowledge:  Good  Language:  Good  Akathisia:  Negative  Handed:  Right  AIMS (if indicated):     Assets:  Desire for Improvement Resilience  ADL's:  Intact  Cognition:  WNL  Sleep:  Number of Hours: 6.75    Assessment-  19 year old female, presented on 4/3 for depression and psychotic symptoms which she described as being mentally tortured by a person who was transmitting thoughts to her.She also describes some auditory hallucinations. She has been diagnosed with schizophrenia.  Patient presents with partial improvement compared to admission. Thought insertion delusions have been subsided, decreased episodes of agitation or restlessness, improved socialization. She does report residual hallucinations - at this time does not appear internally preoccupied . She is tolerating medications well . Denies SI and presents  future oriented at present. Team focus is currently on disposition planning and report from [facility] staff is that patient was not accepted to [place] setting . Other options are now being assessed.  Treatment Plan Summary: Daily contact with patient to assess and evaluate symptoms and progress in treatment, Medication management and Plan : Patient is seen and examined.  [name] with the above-stated past psychiatric history who is seen in follow-up.  Treatment Plan reviewed as below today 5/10 Encourage group participation Treatment team working on disposition planning - see above   1.  Continue Cogentin 1 mg p.o. twice daily as needed tremors and EPS. 2.  Continue fluoxetine 60 mg p.o. daily for depression and anxiety. 3.  Continue gabapentin 400 mg p.o. 3 times daily for anxiety and mood stability. 4.  Continue Haldol as needed & lorazepam as needed for agitation. 5.  Continue paliperidone 12 mg p.o. daily for psychosis/mood control. 6.  Continue trazodone  25 mg p.o. nightly as needed insomnia.   Jenne Campus, MD 11/22/2019, 8:58 AM   Patient ID: Julia [name], female   DOB: 03-13-2001, 19 y.o.MRN: [id number]

## 2019-11-22 NOTE — BHH Counselor (Signed)
CSW attempted to reach women's shelter supervisor, Edwina Barth [phone]) to facilitate a phone screening for admissions consideration.   CSW left a detailed voicemail requesting a callback.   [name], MSW, LCSW-A Clinical Social Worker [facility] Adult Unit

## 2019-11-22 NOTE — BHH Counselor (Signed)
Patient requested a 72 hour voluntary discharge notice.   Patient reports she plans to rent a room from another patient in [place], patient endorses that she does not have income.  Psychiatry aware. Patient signed a 72 hours notice at 3:55pm on 05/10.  [name], MSW, LCSW-A Clinical Social Worker

## 2019-11-23 NOTE — Progress Notes (Signed)
   11/23/19 2200  Psych Admission Type (Psych Patients Only)  Admission Status Voluntary  Psychosocial Assessment  Patient Complaints Anxiety  Eye Contact Brief  Facial Expression Flat  Affect Appropriate to circumstance  Speech Logical/coherent  Interaction Minimal  Motor Activity Slow  Appearance/Hygiene Unremarkable  Behavior Characteristics Cooperative  Mood Anxious  Thought Process  Coherency Concrete thinking  Content WDL  Delusions Paranoid  Perception Hallucinations  Hallucination Auditory;Visual  Judgment Limited  Confusion None  Danger to Self  Current suicidal ideation? Denies  Self-Injurious Behavior No self-injurious ideation or behavior indicators observed or expressed   Agreement Not to Harm Self Yes  Description of Agreement verbally contracts for safety  Danger to Others  Danger to Others None reported or observed

## 2019-11-23 NOTE — Progress Notes (Signed)
   11/23/19 0900  Psych Admission Type (Psych Patients Only)  Admission Status Voluntary  Psychosocial Assessment  Patient Complaints Anxiety  Eye Contact Brief  Facial Expression Flat  Affect Appropriate to circumstance  Speech Logical/coherent  Interaction Minimal  Motor Activity Slow  Appearance/Hygiene Unremarkable  Behavior Characteristics Cooperative  Mood Anxious  Thought Process  Coherency Concrete thinking  Content WDL  Delusions Paranoid  Perception Hallucinations  Hallucination Auditory;Visual  Judgment Limited  Confusion None  Danger to Self  Current suicidal ideation? Denies  Self-Injurious Behavior No self-injurious ideation or behavior indicators observed or expressed   Agreement Not to Harm Self Yes  Description of Agreement verbally contracts for safety  Danger to Others  Danger to Others None reported or observed

## 2019-11-23 NOTE — BHH Counselor (Addendum)
CSW spoke with [name], ACT Team therapist with [facility]. [name] was made aware of patient's proposed discharge plans and that patient is expected to discharge tomorrow. [name] will continue to follow patient at discharge to establish continuation of care and to ensure that patient is able to continue receiving medication management.  CSW transferred call to patient on unit.  CSW received a callback from ACTT Therapist. Patient's ACT Team requested for patient's prescriptions to be faxed to [facility] [phone]: [phone]). ACTT requested prescriptions be faxed over today, so the team can bring patient her medications tomorrow after discharge. ACTT will meet with the patient in the community tomorrow.   [name], MSW, LCSW-A Clinical Social Worker [facility] Adult Unit

## 2019-11-23 NOTE — Progress Notes (Signed)
Supervisor spoke to [place] at [facility].  The director of their program is currently in the hospital.  They also had a death of a resident last week, which is why she has not called back.  Elfrieda would need to speak with the director, Rollene Fare is not sure if director would do this over webex.  Director may be released tomorrow and Rollene Fare has been texting with her.  She will contact supervisor as soon as she knows when a meeting can be arranged.  Supervisor spoke to [employer] at [facility], gave the above update, and that pt may be discharged in the next day or two to a housing situation that the pt is working on here in [place].  Supervisor asked if they could possibly continue to serve her in [place] since her living situation is not very stable and [name] will consider this depending on the address of where she is going. [name], MSW, LCSW Advanced Care Supervisor 11/23/2019 1:47 PM

## 2019-11-23 NOTE — Progress Notes (Signed)
[facility] MD Progress Note  11/23/2019 2:50 PM KIONA BLUME  MRN:  [id number]  Subjective: she reports " I have been here for a long time", " I want to discharge".   Objective: Case reviewed with treatment team and patient seen. 19 year old female, presented on 4/3 for depression and psychotic symptoms which she described as being mentally tortured by a person who was transmitting thoughts to her.She also describes some auditory hallucinations. She has been diagnosed with schizophrenia.  I met with patient along with CSW.  Today we focused on discharge planning . Patient reported that a female peer/patient had offered for her to go live with him following his/her  discharge and she inquired about coordinating her discharge with his. Along with CSW/RN staff I reviewed concerns about this request and patient was able to verbalize understanding regarding possible risks and concerns of going to live with someone she does not know.  She does remain focused on discharge: as reviewed with staff, had been referred to a [place] but this option did not materialize . CSW staff  considering [employer] or female shelter locally.  With regards to psychotic symptoms, patient states she is doing better. She reports auditory hallucinations are not resolved but " a lot better. It was 24/7 when I first got here, now I just hear random things sometimes ". She denies command hallucinations. Currently does not appear internally preoccupied. She reports that thought insertion concerns are " gone" and she is no longer concerned about that. She is tolerating medication well , denies side effects, denies sedation. No akathisia or restlessness noted .   Principal Problem: Schizophrenia, paranoid ([place])  Diagnosis: Principal Problem:   Schizophrenia, paranoid ([place]) Active Problems:   Depressed mood   Psychosis ([place])   MDD (major depressive disorder)  Total Time spent with patient: 15 minutes  Past Psychiatric  History: See admission H&P  Past Medical History:  Past Medical History:  Diagnosis Date  . Burning with urination 05/03/2015  . Contraceptive management 05/03/2015  . Depression   . Dysmenorrhea 12/30/2013  . Heroin addiction ([place])   . Menorrhagia 12/30/2013  . Menstrual extraction 12/30/2013  . Migraines   . Social anxiety disorder 09/25/2016  . Suicidal ideations   . Vaginal odor 05/03/2015    Past Surgical History:  Procedure Laterality Date  . NO PAST SURGERIES     Family History:  Family History  Problem Relation Age of Onset  . Depression Mother   . Hypertension Father   . Hyperlipidemia Father   . Cancer Paternal Grandmother        breast, uterine  . Cirrhosis Paternal Grandfather        due to alcohol   Family Psychiatric  History: See admission H&P  Social History:  Social History   Substance and Sexual Activity  Alcohol Use Yes   Comment: BAC was clear     Social History   Substance and Sexual Activity  Drug Use Yes  . Types: Marijuana, Oxycodone   Comment: reports oxycodone and marijuana    Social History   Socioeconomic History  . Marital status: Single    Spouse name: Not on file  . Number of children: Not on file  . Years of education: Not on file  . Highest education level: Not on file  Occupational History  . Occupation: Unemployed  Tobacco Use  . Smoking status: Current Every Day Smoker    Packs/day: 0.50    Types: Cigarettes  . Smokeless  tobacco: Never Used  . Tobacco comment: Not ready to quit  Substance and Sexual Activity  . Alcohol use: Yes    Comment: BAC was clear  . Drug use: Yes    Types: Marijuana, Oxycodone    Comment: reports oxycodone and marijuana  . Sexual activity: Yes    Birth control/protection: Implant  Other Topics Concern  . Not on file  Social History Narrative   06/23/2019:  Pt stated that she is homeless, that she is a high school graduate, and that she is unemployed and not followed by any outpatient  provider.      Lives with Dad. Mom has LGD, lives in [place]. 11th grader. Dog.   Social Determinants of Health   Financial Resource Strain:   . Difficulty of Paying Living Expenses:   Food Insecurity:   . Worried About Charity fundraiser in the Last Year:   . Arboriculturist in the Last Year:   Transportation Needs:   . Film/video editor (Medical):   [name] Lack of Transportation (Non-Medical):   Physical Activity:   . Days of Exercise per Week:   . Minutes of Exercise per Session:   Stress:   . Feeling of Stress :   Social Connections:   . Frequency of Communication with Friends and Family:   . Frequency of Social Gatherings with Friends and Family:   . Attends Religious Services:   . Active Member of Clubs or Organizations:   . Attends Archivist Meetings:   [name] Marital Status:    Additional Social History:  Pain Medications: Please see MAR Prescriptions: Please see MAR, prozac and cogentin per pt Over the Counter: Please see MAR History of alcohol / drug use?: Yes Longest period of sobriety (when/how long): Unknown Negative Consequences of Use: Legal Name of Substance 1: alcohol 1 - Frequency: UTA 1 - Duration: UTA- pt denies 1 - Last Use / Amount: UTA  Sleep: Good  Appetite:  Good  Current Medications: Current Facility-Administered Medications  Medication Dose Route Frequency Provider Last Rate Last Admin  . acetaminophen (TYLENOL) tablet 650 mg  650 mg Oral Q6H PRN Emmaline Kluver, FNP      . alum & mag hydroxide-simeth (MAALOX/MYLANTA) 200-200-20 MG/5ML suspension 30 mL  30 mL Oral Q4H PRN Emmaline Kluver, FNP   30 mL at 11/22/19 2126  . benztropine (COGENTIN) tablet 1 mg  1 mg Oral BID PRN Kelena Garrow, Myer Peer, MD   1 mg at 11/22/19 2118  . FLUoxetine (PROZAC) capsule 60 mg  60 mg Oral Daily Sharma Covert, MD   60 mg at 11/23/19 0815  . gabapentin (NEURONTIN) capsule 400 mg  400 mg Oral TID Sharma Covert, MD   400 mg at 11/23/19 1218  . haloperidol  (HALDOL) tablet 5 mg  5 mg Oral Q6H PRN Jasmine Mcbeth, Myer Peer, MD   5 mg at 11/21/19 2158   Or  . haloperidol lactate (HALDOL) injection 5 mg  5 mg Intramuscular Q6H PRN Jakhari Space, Myer Peer, MD      . LORazepam (ATIVAN) tablet 1 mg  1 mg Oral Q6H PRN Lindell Spar I, NP   1 mg at 11/22/19 1819   Or  . LORazepam (ATIVAN) injection 1 mg  1 mg Intramuscular Q6H PRN Nwoko, Agnes I, NP      . magnesium hydroxide (MILK OF MAGNESIA) suspension 30 mL  30 mL Oral Daily PRN Emmaline Kluver, FNP      .  nicotine (NICODERM CQ - dosed in mg/24 hr) patch 7 mg  7 mg Transdermal Daily Marque Bango, Myer Peer, MD   7 mg at 11/23/19 0815  . ondansetron (ZOFRAN-ODT) disintegrating tablet 4 mg  4 mg Oral Q8H PRN Lindell Spar I, NP   4 mg at 11/21/19 1858  . paliperidone (INVEGA) 24 hr tablet 12 mg  12 mg Oral QHS Money, Lowry Ram, FNP   12 mg at 11/22/19 2118  . traZODone (DESYREL) tablet 25 mg  25 mg Oral QHS PRN Starlin Steib, Myer Peer, MD   25 mg at 11/22/19 2120   Lab Results:  No results found for this or any previous visit (from the past 48 hour(s)).  Blood Alcohol level:  Lab Results  Component Value Date   ETH <10 10/16/2019   ETH <10 37/62/8315   Metabolic Disorder Labs: Lab Results  Component Value Date   HGBA1C 5.4 10/16/2019   MPG 108.28 10/16/2019   MPG 102.54 05/17/2019   Lab Results  Component Value Date   PROLACTIN 113.0 (H) 11/11/2019   PROLACTIN 152.0 (H) 10/16/2019   Lab Results  Component Value Date   CHOL 138 10/16/2019   TRIG 60 10/16/2019   HDL 36 (L) 10/16/2019   CHOLHDL 3.8 10/16/2019   VLDL 12 10/16/2019   LDLCALC 90 10/16/2019   LDLCALC 96 05/17/2019   Physical Findings: AIMS: Facial and Oral Movements Muscles of Facial Expression: None, normal Lips and Perioral Area: None, normal Jaw: None, normal Tongue: None, normal,Extremity Movements Upper (arms, wrists, hands, fingers): None, normal Lower (legs, knees, ankles, toes): None, normal, Trunk Movements Neck, shoulders, hips: None,  normal, Overall Severity Severity of abnormal movements (highest score from questions above): None, normal Incapacitation due to abnormal movements: None, normal Patient's awareness of abnormal movements (rate only patient's report): No Awareness, Dental Status Current problems with teeth and/or dentures?: No Does patient usually wear dentures?: No  CIWA:  CIWA-Ar Total: 1 COWS:  COWS Total Score: 2  Musculoskeletal: Strength & Muscle Tone: within normal limits Gait & Station: normal Patient leans: N/A  Psychiatric Specialty Exam: Physical Exam  Nursing note and vitals reviewed. Constitutional: She is oriented to person, place, and time. She appears well-developed and well-nourished.  HENT:  Head: Normocephalic and atraumatic.  Cardiovascular:  Elevated pulse rate: 104  Respiratory: Effort normal.  Genitourinary:    Genitourinary Comments: Deferred   Musculoskeletal:        General: Normal range of motion.     Cervical back: Normal range of motion.  Neurological: She is alert and oriented to person, place, and time.  Skin: Skin is warm.    Review of Systems  Constitutional: Negative for chills, diaphoresis and fever.  HENT: Negative for congestion, rhinorrhea, sneezing and sore throat.   Eyes: Negative for discharge.  Respiratory: Negative for cough, chest tightness, shortness of breath and wheezing.   Cardiovascular: Negative for chest pain and palpitations.  Gastrointestinal: Negative for diarrhea, nausea and vomiting.  Genitourinary: Negative for difficulty urinating.  Musculoskeletal: Negative.   Allergic/Immunologic: Negative for environmental allergies and food allergies.       Allergies: Abilify, Sertraline (Zoloft)  Neurological: Negative for dizziness, tremors, seizures, syncope, light-headedness and headaches.  Psychiatric/Behavioral: Positive for dysphoric mood ("Improving"). Negative for agitation, behavioral problems, confusion, decreased concentration,  hallucinations, self-injury, sleep disturbance and suicidal ideas. The patient is nervous/anxious. The patient is not hyperactive.   no headache, no chest pain, no cough, no vomiting today  Blood pressure (!) 92/54, pulse (!) 121,  temperature 97.6 F (36.4 C), temperature source Oral, resp. rate 18, height '5\' 3"'$  (1.6 m), weight 67.8 kg, SpO2 100 %.Body mass index is 26.48 kg/m.  General Appearance: Casual  Eye Contact:  Good  Speech:  Normal Rate  Volume:  Normal  Mood:  denies feeling depressed,   Affect:  gradually becoming less blunted   Thought Process:  Linear and Descriptions of Associations: Circumstantial  Orientation:  Other:  fully alert and attentive  Thought Content:  improving psychotic symptoms, reports halluincations much improved but not completely resolved, currently not internally preoccupied   Suicidal Thoughts:  No denies SI or self injurious ideations  Homicidal Thoughts:  No  Memory:  recent and remote grossly intact   Judgement:  Other:  improving   Insight:  Fair/ improving   Psychomotor Activity:  improving   Concentration:  Concentration: Good and Attention Span: Good  Recall:  Good  Fund of Knowledge:  Good  Language:  Good  Akathisia:  Negative  Handed:  Right  AIMS (if indicated):     Assets:  Desire for Improvement Resilience  ADL's:  Intact  Cognition:  WNL  Sleep:  Number of Hours: 6.75    Assessment-  19 year old female, presented on 4/3 for depression and psychotic symptoms which she described as being mentally tortured by a person who was transmitting thoughts to her.She also describes some auditory hallucinations. She has been diagnosed with schizophrenia.  Patient is presenting with gradual but noticeable improvement since admission. Describes much improved although not completely resolved hallucinations and no longer appears concerned or distressed about thought insertion ideations. She reports mood as improved, denies SI and is focused on  discharge planning . CSW Aldean Ast working on disposition options. Denies medication side effects  Treatment Plan Summary: Daily contact with patient to assess and evaluate symptoms and progress in treatment, Medication management and Plan : Patient is seen and examined.  [name] with the above-stated past psychiatric history who is seen in follow-up.  Treatment Plan reviewed as below today 5/11 Encourage group participation Treatment team working on disposition planning - see above   1.  Continue Cogentin 1 mg p.o. twice daily as needed tremors and EPS. 2.  Continue fluoxetine 60 mg p.o. daily for depression and anxiety. 3.  Continue gabapentin 400 mg p.o. 3 times daily for anxiety and mood stability. 4.  Continue Haldol as needed & lorazepam as needed for agitation. 5.  Continue paliperidone 12 mg p.o. daily for psychosis/mood control. 6.  Continue Trazodone  25 mg p.o. nightly as needed insomnia.   Jenne Campus, MD 11/23/2019, 2:50 PM   Patient ID: [name], female   DOB: May 23, 2001, 19 y.o.MRN: [id number]

## 2019-11-23 NOTE — Progress Notes (Signed)
Recreation Therapy Notes  Animal-Assisted Activity (AAA) Program Checklist/Progress Notes Patient Eligibility Criteria Checklist & Daily Group note for Rec Tx Intervention  Date: 5.11.21 Time: 46 Location: [address]   AAA/T Program Assumption of Risk Form signed by Teacher, music or Parent Legal Guardian  YES   Patient is free of allergies or sever asthma  YES  Patient reports no fear of animals  YES  Patient reports no history of cruelty to animals  YES   Patient understands his/her participation is voluntary  YES   Patient washes hands before animal contact YES   Patient washes hands after animal contact YES  Behavioral Response: Engaged  Education: Contractor, Appropriate Animal Interaction   Education Outcome: Acknowledges understanding/In group clarification offered/Needs additional education.   Clinical Observations/Feedback: Pt attended and participated in group activity.    [name], LRT/CTRS    [name] A 11/23/2019 3:27 PM

## 2019-11-24 MED ORDER — NICOTINE POLACRILEX 2 MG MT GUM
2.0000 mg | CHEWING_GUM | OROMUCOSAL | Status: DC | PRN
  Administered 2019-11-24 – 2019-11-26 (×5): 2 mg via ORAL
  Filled 2019-11-24 (×4): qty 1

## 2019-11-24 NOTE — Tx Team (Cosign Needed)
Interdisciplinary Treatment and Diagnostic Plan Update  11/24/2019 Time of Session: 8:45am  Julia [name] MRN: [id number]  Principal Diagnosis: Schizophrenia, paranoid [facility]  Secondary Diagnoses: Principal Problem:   Schizophrenia, paranoid ([place]) Active Problems:   Depressed mood   Psychosis ([place])   MDD (major depressive disorder)   Current Medications:  Current Facility-Administered Medications  Medication Dose Route Frequency Provider Last Rate Last Admin  . acetaminophen (TYLENOL) tablet 650 mg  650 mg Oral Q6H PRN Emmaline Kluver, FNP      . alum & mag hydroxide-simeth (MAALOX/MYLANTA) 200-200-20 MG/5ML suspension 30 mL  30 mL Oral Q4H PRN Emmaline Kluver, FNP   30 mL at 11/22/19 2126  . benztropine (COGENTIN) tablet 1 mg  1 mg Oral BID PRN Cobos, Myer Peer, MD   1 mg at 11/23/19 2347  . FLUoxetine (PROZAC) capsule 60 mg  60 mg Oral Daily Sharma Covert, MD   60 mg at 11/24/19 0837  . gabapentin (NEURONTIN) capsule 400 mg  400 mg Oral TID Sharma Covert, MD   400 mg at 11/24/19 1142  . haloperidol (HALDOL) tablet 5 mg  5 mg Oral Q6H PRN Cobos, Myer Peer, MD   5 mg at 11/21/19 2158   Or  . haloperidol lactate (HALDOL) injection 5 mg  5 mg Intramuscular Q6H PRN Cobos, Myer Peer, MD      . LORazepam (ATIVAN) tablet 1 mg  1 mg Oral Q6H PRN Lindell Spar I, NP   1 mg at 11/22/19 1819   Or  . LORazepam (ATIVAN) injection 1 mg  1 mg Intramuscular Q6H PRN Nwoko, Agnes I, NP      . magnesium hydroxide (MILK OF MAGNESIA) suspension 30 mL  30 mL Oral Daily PRN Emmaline Kluver, FNP      . nicotine (NICODERM CQ - dosed in mg/24 hr) patch 7 mg  7 mg Transdermal Daily Cobos, Myer Peer, MD   7 mg at 11/24/19 [id number]  . ondansetron (ZOFRAN-ODT) disintegrating tablet 4 mg  4 mg Oral Q8H PRN Lindell Spar I, NP   4 mg at 11/21/19 1858  . paliperidone (INVEGA) 24 hr tablet 12 mg  12 mg Oral QHS Money, Lowry Ram, FNP   12 mg at 11/23/19 2230  . traZODone (DESYREL) tablet 25 mg  25 mg Oral QHS PRN Cobos,  Myer Peer, MD   25 mg at 11/23/19 2230   PTA Medications: Medications Prior to Admission  Medication Sig Dispense Refill Last Dose  . ibuprofen (ADVIL) 400 MG tablet Take 400 mg by mouth every 6 (six) hours as needed for headache or mild pain.     . benztropine (COGENTIN) 1 MG tablet Take 1 tablet (1 mg total) by mouth 2 (two) times daily. (Patient not taking: Reported on 10/16/2019) 60 tablet 1 Not Taking at Unknown time  . FLUoxetine (PROZAC) 20 MG capsule Take 1 capsule (20 mg total) by mouth daily. (Patient not taking: Reported on 10/16/2019) 30 capsule 1 Not Taking at Unknown time  . gabapentin (NEURONTIN) 400 MG capsule Take 1 capsule (400 mg total) by mouth 3 (three) times daily. (Patient not taking: Reported on 10/16/2019) 90 capsule 1 Not Taking at Unknown time  . haloperidol decanoate (HALDOL DECANOATE) 100 MG/ML injection Inject 0.5 mLs (50 mg total) into the muscle every 30 (thirty) days. NEXT DOSE DUE AROUND September 15, 2019 (Patient not taking: Reported on 10/16/2019) 1 mL 1 Not Taking at Unknown time  . paliperidone (INVEGA SUSTENNA) 156 MG/ML  SUSY injection Inject 1 mL (156 mg total) into the muscle every 30 (thirty) days. NEXT DOSE DUE AROUND September 15, 2019 (Patient not taking: Reported on 10/16/2019) 1.2 mL 1 Not Taking at Unknown time  . paliperidone (INVEGA) 3 MG 24 hr tablet Take 1 tablet (3 mg total) by mouth daily. (Patient not taking: Reported on 10/16/2019) 30 tablet 1 Not Taking at Unknown time  . traZODone (DESYREL) 50 MG tablet Take 1 tablet (50 mg total) by mouth at bedtime. (Patient not taking: Reported on 10/16/2019) 30 tablet 1 Not Taking at Unknown time    Patient Stressors:    Patient Strengths:    Treatment Modalities: Medication Management, Group therapy, Case management,  1 to 1 session with clinician, Psychoeducation, Recreational therapy.   Physician Treatment Plan for Primary Diagnosis: Schizophrenia, paranoid ([place]) Long Term Goal(s): Improvement in symptoms so as ready  for discharge Improvement in symptoms so as ready for discharge   Short Term Goals: Ability to identify changes in lifestyle to reduce recurrence of condition will improve Ability to verbalize feelings will improve Ability to disclose and discuss suicidal ideas Ability to demonstrate self-control will improve Ability to identify and develop effective coping behaviors will improve Ability to maintain clinical measurements within normal limits will improve Compliance with prescribed medications will improve Ability to identify changes in lifestyle to reduce recurrence of condition will improve Ability to verbalize feelings will improve Ability to disclose and discuss suicidal ideas Ability to demonstrate self-control will improve Ability to identify and develop effective coping behaviors will improve Ability to maintain clinical measurements within normal limits will improve Compliance with prescribed medications will improve  Medication Management: Evaluate patient's response, side effects, and tolerance of medication regimen.  Therapeutic Interventions: 1 to 1 sessions, Unit Group sessions and Medication administration.  Evaluation of Outcomes: Adequate for Discharge  Physician Treatment Plan for Secondary Diagnosis: Principal Problem:   Schizophrenia, paranoid ([place]) Active Problems:   Depressed mood   Psychosis ([place])   MDD (major depressive disorder)  Long Term Goal(s): Improvement in symptoms so as ready for discharge Improvement in symptoms so as ready for discharge   Short Term Goals: Ability to identify changes in lifestyle to reduce recurrence of condition will improve Ability to verbalize feelings will improve Ability to disclose and discuss suicidal ideas Ability to demonstrate self-control will improve Ability to identify and develop effective coping behaviors will improve Ability to maintain clinical measurements within normal limits will improve Compliance with  prescribed medications will improve Ability to identify changes in lifestyle to reduce recurrence of condition will improve Ability to verbalize feelings will improve Ability to disclose and discuss suicidal ideas Ability to demonstrate self-control will improve Ability to identify and develop effective coping behaviors will improve Ability to maintain clinical measurements within normal limits will improve Compliance with prescribed medications will improve     Medication Management: Evaluate patient's response, side effects, and tolerance of medication regimen.  Therapeutic Interventions: 1 to 1 sessions, Unit Group sessions and Medication administration.  Evaluation of Outcomes: Adequate for Discharge   RN Treatment Plan for Primary Diagnosis: Schizophrenia, paranoid ([place]) Long Term Goal(s): Knowledge of disease and therapeutic regimen to maintain health will improve  Short Term Goals: Ability to participate in decision making will improve, Ability to verbalize feelings will improve, Ability to disclose and discuss suicidal ideas, Ability to identify and develop effective coping behaviors will improve and Compliance with prescribed medications will improve  Medication Management: RN will administer medications as ordered by  provider, will assess and evaluate patient's response and provide education to patient for prescribed medication. RN will report any adverse and/or side effects to prescribing provider.  Therapeutic Interventions: 1 on 1 counseling sessions, Psychoeducation, Medication administration, Evaluate responses to treatment, Monitor vital signs and CBGs as ordered, Perform/monitor CIWA, COWS, AIMS and Fall Risk screenings as ordered, Perform wound care treatments as ordered.  Evaluation of Outcomes: Adequate for Discharge   LCSW Treatment Plan for Primary Diagnosis: Schizophrenia, paranoid ([place]) Long Term Goal(s): Safe transition to appropriate next level of care at  discharge, Engage patient in therapeutic group addressing interpersonal concerns.  Short Term Goals: Engage patient in aftercare planning with referrals and resources and Increase skills for wellness and recovery  Therapeutic Interventions: Assess for all discharge needs, 1 to 1 time with Social worker, Explore available resources and support systems, Assess for adequacy in community support network, Educate family and significant other(s) on suicide prevention, Complete Psychosocial Assessment, Interpersonal group therapy.  Evaluation of Outcomes: Adequate for Discharge   Progress in Treatment: Attending groups: Yes. Participating in groups: Yes. Taking medication as prescribed: Yes. Toleration medication: Yes. Family/Significant other contact made: Yes, individual(s) contacted:  with pt's father  Patient understands diagnosis: Yes. Discussing patient identified problems/goals with staff: Yes. Medical problems stabilized or resolved: Yes. Denies suicidal/homicidal ideation: Yes. Issues/concerns per patient self-inventory: No. Other:   New problem(s) identified: Yes, Describe:  group home placement   New Short Term/Long Term Goal(s): Medication stabilization, elimination of SI thoughts, and development of a comprehensive mental wellness plan.   Patient Goals:    Discharge Plan or Barriers: Patient is potentially discharging to [facility] due to her previous group home referral being declined. Patient will continue to be followed by her ACTT services through [facility]. CSW will continue to follow up for appropriate referrals and possible discharge planning  Reason for Continuation of Hospitalization: Anxiety Delusions  Medication stabilization  Estimated Length of Stay: TBD  Attendees: Patient:  10/22/2019   Physician: Dr. Jake Samples, MD; Dr. Neita Garnet, MD; Dr. Myles Lipps, MD 10/22/2019   Nursing: Gilberto Better., RN 10/22/2019   RN Care Manager: 10/22/2019   Social Worker: Lurline Idol, LCSW; Radonna Ricker, LCSW 10/22/2019   Recreational Therapist:  10/22/2019   Other: Ovidio Kin, MSW intern  10/22/2019   Other:  10/22/2019   Other: 10/22/2019      Scribe for Treatment Team: Marylee Floras, [place] 11/24/2019 1:41 PM

## 2019-11-24 NOTE — [facility] Counselor (Signed)
CSW attempted to [name] [phone]), ACTT therapist. CSW left a detailed voicemail informing ACT Team that patient's discharge was postponed today.  [name], MSW, LCSW-A Clinical Social Worker [facility]

## 2019-11-24 NOTE — Progress Notes (Signed)
Recreation Therapy Notes  Date:  5.12.21 Time: 0930 Location: 300 Hall Group Room  Group Topic: Stress Management  Goal Area(s) Addresses:  Patient will identify positive stress management techniques. Patient will identify benefits of using stress management post d/c.  Behavioral Response: Engaged  Intervention: Stress Management  Activity: Guided Imagery.  LRT read a script that took patients on a journey to their peaceful place.  Patients were to listen and follow along as script was read to engage in activity.  Education:  Stress Management, Discharge Planning.   Education Outcome: Acknowledges Education  Clinical Observations/Feedback: Pt attended and participated in activity.    [name], LRT/CTRS    [name] A 11/24/2019 10:56 AM

## 2019-11-24 NOTE — BHH Counselor (Signed)
CSW attempted to speak with Edwina Barth, Director of [employer] shelter at [facility]. There was no answer, CSW left a HIPAA compliant voicemail to determine bed availability and whether they would accept the patient for services.   CSW will continue to follow   Radonna Ricker, MSW, [place] Worker [facility]  Phone: [phone]

## 2019-11-24 NOTE — Progress Notes (Signed)
[facility] MD Progress Note  11/24/2019 11:27 AM Julia [name]  MRN:  [id number]  Subjective: Patient states she is feeling better.  Currently she remains focused on discharge planning.  Denies medication side effects.  Denies suicidal ideations.  Objective: Case reviewed with treatment team and patient seen. 19 year old female, presented on 4/3 for depression and psychotic symptoms which she described as being mentally tortured by a person who was transmitting thoughts to her.She also describes some auditory hallucinations. She has been diagnosed with schizophrenia.  Today patient presents alert, attentive, calm and polite on approach. Affect remains vaguely blunted but does smile briefly at times.  Denies feeling depressed at this time. Denies suicidal ideations She describes improved but not resolved auditory hallucinations, states she hears "weird things" but denies any command hallucinations and at this time does not appear internally preoccupied.  Thought insertion delusions/preoccupations have largely resolved and she has been noted to be able to interact with peers in dayroom without significant stress or discomfort. Treatment team and patient's focus has been on discharge planning.  Patient reports she has no family or friends she can go live with.  She had been referred to a group home but this did not materialize.  At this time staff is working on [facility] as an option.  Patient states she has been there before, does report that she was asked to leave but states "I never did anything wrong".  She had also reported a plan of moving in with a female that she met on unit and who has already discharged.  We have reviewed potential concerns regarding this, and patient has verbalized understanding. Denies medication side effects.  Principal Problem: Schizophrenia, paranoid ([place])  Diagnosis: Principal Problem:   Schizophrenia, paranoid ([place]) Active Problems:   Depressed mood  Psychosis ([place])   MDD (major depressive disorder)  Total Time spent with patient: 15 minutes  Past Psychiatric History: See admission H&P  Past Medical History:  Past Medical History:  Diagnosis Date  . Burning with urination 05/03/2015  . Contraceptive management 05/03/2015  . Depression   . Dysmenorrhea 12/30/2013  . Heroin addiction ([place])   . Menorrhagia 12/30/2013  . Menstrual extraction 12/30/2013  . Migraines   . Social anxiety disorder 09/25/2016  . Suicidal ideations   . Vaginal odor 05/03/2015    Past Surgical History:  Procedure Laterality Date  . NO PAST SURGERIES     Family History:  Family History  Problem Relation Age of Onset  . Depression Mother   . Hypertension Father   . Hyperlipidemia Father   . Cancer Paternal Grandmother        breast, uterine  . Cirrhosis Paternal Grandfather        due to alcohol   Family Psychiatric  History: See admission H&P  Social History:  Social History   Substance and Sexual Activity  Alcohol Use Yes   Comment: BAC was clear     Social History   Substance and Sexual Activity  Drug Use Yes  . Types: Marijuana, Oxycodone   Comment: reports oxycodone and marijuana    Social History   Socioeconomic History  . Marital status: Single    Spouse name: Not on file  . Number of children: Not on file  . Years of education: Not on file  . Highest education level: Not on file  Occupational History  . Occupation: Unemployed  Tobacco Use  . Smoking status: Current Every Day Smoker    Packs/day: 0.50  Types: Cigarettes  . Smokeless tobacco: Never Used  . Tobacco comment: Not ready to quit  Substance and Sexual Activity  . Alcohol use: Yes    Comment: BAC was clear  . Drug use: Yes    Types: Marijuana, Oxycodone    Comment: reports oxycodone and marijuana  . Sexual activity: Yes    Birth control/protection: Implant  Other Topics Concern  . Not on file  Social History Narrative   06/23/2019:  Pt stated that she  is homeless, that she is a high school graduate, and that she is unemployed and not followed by any outpatient provider.      Lives with Dad. Mom has LGD, lives in [place]. 11th grader. Dog.   Social Determinants of Health   Financial Resource Strain:   . Difficulty of Paying Living Expenses:   Food Insecurity:   . Worried About Charity fundraiser in the Last Year:   . Arboriculturist in the Last Year:   Transportation Needs:   . Film/video editor (Medical):   [name] Lack of Transportation (Non-Medical):   Physical Activity:   . Days of Exercise per Week:   . Minutes of Exercise per Session:   Stress:   . Feeling of Stress :   Social Connections:   . Frequency of Communication with Friends and Family:   . Frequency of Social Gatherings with Friends and Family:   . Attends Religious Services:   . Active Member of Clubs or Organizations:   . Attends Archivist Meetings:   [name] Marital Status:    Additional Social History:  Pain Medications: Please see MAR Prescriptions: Please see MAR, prozac and cogentin per pt Over the Counter: Please see MAR History of alcohol / drug use?: Yes Longest period of sobriety (when/how long): Unknown Negative Consequences of Use: Legal Name of Substance 1: alcohol 1 - Frequency: UTA 1 - Duration: UTA- pt denies 1 - Last Use / Amount: UTA  Sleep: Good  Appetite:  Good  Current Medications: Current Facility-Administered Medications  Medication Dose Route Frequency Provider Last Rate Last Admin  . acetaminophen (TYLENOL) tablet 650 mg  650 mg Oral Q6H PRN Emmaline Kluver, FNP      . alum & mag hydroxide-simeth (MAALOX/MYLANTA) 200-200-20 MG/5ML suspension 30 mL  30 mL Oral Q4H PRN Emmaline Kluver, FNP   30 mL at 11/22/19 2126  . benztropine (COGENTIN) tablet 1 mg  1 mg Oral BID PRN Clista Rainford, Myer Peer, MD   1 mg at 11/23/19 2347  . FLUoxetine (PROZAC) capsule 60 mg  60 mg Oral Daily Sharma Covert, MD   60 mg at 11/24/19 0837  . gabapentin  (NEURONTIN) capsule 400 mg  400 mg Oral TID Sharma Covert, MD   400 mg at 11/24/19 0837  . haloperidol (HALDOL) tablet 5 mg  5 mg Oral Q6H PRN Hope Brandenburger, Myer Peer, MD   5 mg at 11/21/19 2158   Or  . haloperidol lactate (HALDOL) injection 5 mg  5 mg Intramuscular Q6H PRN Mizuki Hoel, Myer Peer, MD      . LORazepam (ATIVAN) tablet 1 mg  1 mg Oral Q6H PRN Lindell Spar I, NP   1 mg at 11/22/19 1819   Or  . LORazepam (ATIVAN) injection 1 mg  1 mg Intramuscular Q6H PRN Nwoko, Agnes I, NP      . magnesium hydroxide (MILK OF MAGNESIA) suspension 30 mL  30 mL Oral Daily PRN Emmaline Kluver, FNP      .  nicotine (NICODERM CQ - dosed in mg/24 hr) patch 7 mg  7 mg Transdermal Daily Filmore Molyneux, Myer Peer, MD   7 mg at 11/24/19 0838  . ondansetron (ZOFRAN-ODT) disintegrating tablet 4 mg  4 mg Oral Q8H PRN Lindell Spar I, NP   4 mg at 11/21/19 1858  . paliperidone (INVEGA) 24 hr tablet 12 mg  12 mg Oral QHS Money, Lowry Ram, FNP   12 mg at 11/23/19 2230  . traZODone (DESYREL) tablet 25 mg  25 mg Oral QHS PRN Upton Russey, Myer Peer, MD   25 mg at 11/23/19 2230   Lab Results:  No results found for this or any previous visit (from the past 48 hour(s)).  Blood Alcohol level:  Lab Results  Component Value Date   ETH <10 10/16/2019   ETH <10 16/04/9603   Metabolic Disorder Labs: Lab Results  Component Value Date   HGBA1C 5.4 10/16/2019   MPG 108.28 10/16/2019   MPG 102.54 05/17/2019   Lab Results  Component Value Date   PROLACTIN 113.0 (H) 11/11/2019   PROLACTIN 152.0 (H) 10/16/2019   Lab Results  Component Value Date   CHOL 138 10/16/2019   TRIG 60 10/16/2019   HDL 36 (L) 10/16/2019   CHOLHDL 3.8 10/16/2019   VLDL 12 10/16/2019   LDLCALC 90 10/16/2019   LDLCALC 96 05/17/2019   Physical Findings: AIMS: Facial and Oral Movements Muscles of Facial Expression: None, normal Lips and Perioral Area: None, normal Jaw: None, normal Tongue: None, normal,Extremity Movements Upper (arms, wrists, hands, fingers):  None, normal Lower (legs, knees, ankles, toes): None, normal, Trunk Movements Neck, shoulders, hips: None, normal, Overall Severity Severity of abnormal movements (highest score from questions above): None, normal Incapacitation due to abnormal movements: None, normal Patient's awareness of abnormal movements (rate only patient's report): No Awareness, Dental Status Current problems with teeth and/or dentures?: No Does patient usually wear dentures?: No  CIWA:  CIWA-Ar Total: 1 COWS:  COWS Total Score: 2  Musculoskeletal: Strength & Muscle Tone: within normal limits Gait & Station: normal Patient leans: N/A  Psychiatric Specialty Exam: Physical Exam  Nursing note and vitals reviewed. Constitutional: She is oriented to person, place, and time. She appears well-developed and well-nourished.  HENT:  Head: Normocephalic and atraumatic.  Cardiovascular:  Elevated pulse rate: 104  Respiratory: Effort normal.  Genitourinary:    Genitourinary Comments: Deferred   Musculoskeletal:        General: Normal range of motion.     Cervical back: Normal range of motion.  Neurological: She is alert and oriented to person, place, and time.  Skin: Skin is warm.    Review of Systems  Constitutional: Negative for chills, diaphoresis and fever.  HENT: Negative for congestion, rhinorrhea, sneezing and sore throat.   Eyes: Negative for discharge.  Respiratory: Negative for cough, chest tightness, shortness of breath and wheezing.   Cardiovascular: Negative for chest pain and palpitations.  Gastrointestinal: Negative for diarrhea, nausea and vomiting.  Genitourinary: Negative for difficulty urinating.  Musculoskeletal: Negative.   Allergic/Immunologic: Negative for environmental allergies and food allergies.       Allergies: Abilify, Sertraline (Zoloft)  Neurological: Negative for dizziness, tremors, seizures, syncope, light-headedness and headaches.  Psychiatric/Behavioral: Positive for dysphoric  mood ("Improving"). Negative for agitation, behavioral problems, confusion, decreased concentration, hallucinations, self-injury, sleep disturbance and suicidal ideas. The patient is nervous/anxious. The patient is not hyperactive.   no headache, no chest pain, no cough, no vomiting today  Blood pressure (!) 92/54, pulse (!) 121,  temperature 97.6 F (36.4 C), temperature source Oral, resp. rate 18, height _0  (1.6 m), weight 67.8 kg, SpO2 100 %.Body mass index is 26.48 kg/m.  General Appearance: Improving grooming/collateral  Eye Contact:  Good  Speech:  Normal Rate  Volume:  Normal  Mood:  States mood is "okay" denies feeling depression,   Affect:  Remains blunted but to a lesser degree and does smile briefly during session  Thought Process:  Linear and Descriptions of Associations: Intact  Orientation:  Other:  fully alert and attentive  Thought Content:  Describes lingering auditory hallucinations, denies command hallucinations, no delusions are currently expressed, does not appear internally preoccupied  Suicidal Thoughts:  No denies SI or self injurious ideations  Homicidal Thoughts:  No  Memory:  recent and remote grossly intact   Judgement:  Other:  improving   Insight:  Fair/ improving   Psychomotor Activity:  improving   Concentration:  Concentration: Good and Attention Span: Good  Recall:  Good  Fund of Knowledge:  Good  Language:  Good  Akathisia:  Negative  Handed:  Right  AIMS (if indicated):     Assets:  Desire for Improvement Resilience  ADL's:  Intact  Cognition:  WNL  Sleep:  Number of Hours: 6.75    Assessment-  19 year old female, presented on 4/3 for depression and psychotic symptoms which she described as being mentally tortured by a person who was transmitting thoughts to her.She also describes some auditory hallucinations. She has been diagnosed with schizophrenia.  There has been a gradual but noticeable improvement compared to admission.  Some  lingering symptoms are noted such as a tendency towards blunted affect (although improved) and describes lingering hallucinations although currently does not appear internally preoccupied.  She is denying SI and presents future oriented, focusing on discharge.  Treatment team is working on providing best possible discharge plan and currently looking at [facility] as a possibility.  Treatment Plan Summary: Daily contact with patient to assess and evaluate symptoms and progress in treatment, Medication management and Plan : Patient is seen and examined.  [name] with the above-stated past psychiatric history who is seen in follow-up.  Treatment Plan reviewed as below today 5/12 Encourage group participation Treatment team working on disposition planning - see above   1.  Continue Cogentin 1 mg p.o. twice daily as needed tremors and EPS. 2.  Continue fluoxetine 60 mg p.o. daily for depression and anxiety. 3.  Continue gabapentin 400 mg p.o. 3 times daily for anxiety and mood stability. 4.  Continue Haldol as needed & lorazepam as needed for agitation. 5.  Continue paliperidone 12 mg p.o. daily for psychosis/mood control. 6.  Continue Trazodone  25 mg p.o. nightly as needed insomnia.   Jenne Campus, MD 11/24/2019, 11:27 AM   Patient ID: Julia [name], female   DOB: 2001-03-22, 19 y.o.MRN: [id number]

## 2019-11-24 NOTE — BHH Counselor (Signed)
Patient has an interview scheduled for tomorrow morning at 9:00am with English as a second language teacher, Angela Nevin. The interview will occur via Webex and CSW team will facilitate the screening.  [name], MSW, LCSW-A Clinical Social Worker [facility]

## 2019-11-24 NOTE — Progress Notes (Signed)
Psychoeducational Group Note  Date:  11/24/2019 Time:  2030  Group Topic/Focus:  wrap up group  Participation Level: Did Not Attend  Participation Quality:  Not Applicable  Affect:  Not Applicable  Cognitive:  Not Applicable  Insight:  Not Applicable  Engagement in Group: Not Applicable  Additional Comments: Pt was notified that group was beginning but remained in bed.   Shellia Cleverly 11/24/2019, 9:41 PM

## 2019-11-24 NOTE — BHH Group Notes (Signed)
LCSW Group Therapy Note 11/24/2019 1:47 PM  Type of Therapy and Topic: Group Therapy: Overcoming Obstacles  Participation Level: Active  Description of Group:  In this group patients will be encouraged to explore what they see as obstacles to their own wellness and recovery. They will be guided to discuss their thoughts, feelings, and behaviors related to these obstacles. The group will process together ways to cope with barriers, with attention given to specific choices patients can make. Each patient will be challenged to identify changes they are motivated to make in order to overcome their obstacles. This group will be process-oriented, with patients participating in exploration of their own experiences as well as giving and receiving support and challenge from other group members.  Therapeutic Goals: 1. Patient will identify personal and current obstacles as they relate to admission. 2. Patient will identify barriers that currently interfere with their wellness or overcoming obstacles.  3. Patient will identify feelings, thought process and behaviors related to these barriers. 4. Patient will identify two changes they are willing to make to overcome these obstacles:   Summary of Patient Progress  Julia [name] was engaged and participated throughout the group session. Julia [name] reports she is concerned about her living arrangements. Julia [name] reports she hopes her interview with the [facility] goes well. Julia [name] states she is ready to discharge when she finds somewhere to go.    Therapeutic Modalities:  Cognitive Behavioral Therapy Solution Focused Therapy Motivational Interviewing Relapse Prevention Therapy   Theresa Duty Clinical Social Worker

## 2019-11-24 NOTE — Progress Notes (Signed)
   11/24/19 2200  Psych Admission Type (Psych Patients Only)  Admission Status Voluntary  Psychosocial Assessment  Patient Complaints Anxiety  Eye Contact Brief  Facial Expression Flat  Affect Appropriate to circumstance  Speech Logical/coherent  Interaction Minimal  Motor Activity Slow  Appearance/Hygiene Unremarkable  Behavior Characteristics Cooperative  Mood Anxious  Thought Process  Coherency Concrete thinking  Content WDL  Delusions Paranoid  Perception Hallucinations;WDL  Hallucination Auditory;Visual;None reported or observed  Judgment Limited  Confusion None  Danger to Self  Current suicidal ideation? Denies  Self-Injurious Behavior No self-injurious ideation or behavior indicators observed or expressed   Agreement Not to Harm Self Yes  Description of Agreement verbally contracts for safety  Danger to Others  Danger to Others None reported or observed

## 2019-11-24 NOTE — Progress Notes (Signed)
D: Patient Presents with flat mood and affect.  Patient was calm and cooperative during med pass and took his medicine without incident.  Patient was out in open areas and was social with peers and staff.  Patient denies suicidal thoughts and self harming thoughts. Patient reported some anxiety. Patient was given scheduled Gabapentin. A:  Patient took scheduled medicine.  Support and encouragement provided Routine safety checks conducted every 15 minutes. Patient  Informed to notify staff with any concerns.  Safety maintained. R:  No adverse drug reactions noted.  Patient contracts for safety.  Patient compliant with medication and treatment plan. Patient cooperative and calm. Safety maintained.

## 2019-11-25 NOTE — Progress Notes (Signed)
Pt stated she was doing better, pt visible in the dayroom interacting with peers    11/25/19 2000  Psych Admission Type (Psych Patients Only)  Admission Status Voluntary  Psychosocial Assessment  Patient Complaints Anxiety  Eye Contact Brief  Facial Expression Flat  Affect Appropriate to circumstance  Speech Logical/coherent  Interaction Minimal  Motor Activity Slow  Appearance/Hygiene Unremarkable  Behavior Characteristics Cooperative  Mood Anxious  Thought Process  Coherency Concrete thinking  Content WDL  Delusions Paranoid  Perception Hallucinations  Hallucination Auditory;Visual  Judgment Limited  Confusion None  Danger to Self  Current suicidal ideation? Denies  Self-Injurious Behavior No self-injurious ideation or behavior indicators observed or expressed   Agreement Not to Harm Self Yes  Description of Agreement verbally contracts for safety  Danger to Others  Danger to Others None reported or observed

## 2019-11-25 NOTE — BHH Counselor (Signed)
CSW attempted to reach patient's ACTT Therapist, [name] ([phone]) a second time to coordinate ACTT services at discharge.  Patient's temporarily [place] has been printed and placed on patient's chart.  [name], MSW, LCSW-A Clinical Social Worker [facility]

## 2019-11-25 NOTE — Progress Notes (Signed)
[facility] MD Progress Note  11/25/2019 10:59 AM Julia [name]  MRN:  [id number]  Subjective: patient reports she is feeling " better" although endorses some lingering/ongoing anxiety. Reports she continues to experience intermittent auditory hallucinations and that she feels vaguely paranoid at times but states" I know it is in my head so I ignore it".  Currently denies suicidal ideations. Denies medication side effects.  Objective: Case reviewed with treatment team and patient seen. 19 year old female, presented on 4/3 for depression and psychotic symptoms which she described as being mentally tortured by a person who was transmitting thoughts to her.She also describes some auditory hallucinations. She has been diagnosed with schizophrenia.  Patient presents alert, attentive, calm, in no acute distress, no psychomotor agitation or restlessness are noted at this time. She has been visible on the unit in dayroom, noted to be playing cards with peers, behavior in good control. As above, patient endorses some lingering psychotic symptoms but noticeably improved compared to admission.  Although she states she feels vaguely paranoid she has been able to interact appropriately with staff and peers, behavior has been calm and in good control.  He does not appear internally preoccupied.  Insight has also improved and she states that she is aware that symptoms are "in my head" which she states helps her ignore the hallucinations.  She denies any command hallucinations. As noted in prior notes, staff is currently focused on discharging patient.  Patient is homeless and has little support at work.  Patient had an interview at [facility] earlier today, and CSW reports that she has been accepted as of tomorrow.  Patient aware and looking forward to this. Tolerating medications well. Principal Problem: Schizophrenia, paranoid ([place])  Diagnosis: Principal Problem:   Schizophrenia, paranoid ([place]) Active  Problems:   Depressed mood   Psychosis ([place])   MDD (major depressive disorder)  Total Time spent with patient: 15 minutes  Past Psychiatric History: See admission H&P  Past Medical History:  Past Medical History:  Diagnosis Date  . Burning with urination 05/03/2015  . Contraceptive management 05/03/2015  . Depression   . Dysmenorrhea 12/30/2013  . Heroin addiction ([place])   . Menorrhagia 12/30/2013  . Menstrual extraction 12/30/2013  . Migraines   . Social anxiety disorder 09/25/2016  . Suicidal ideations   . Vaginal odor 05/03/2015    Past Surgical History:  Procedure Laterality Date  . NO PAST SURGERIES     Family History:  Family History  Problem Relation Age of Onset  . Depression Mother   . Hypertension Father   . Hyperlipidemia Father   . Cancer Paternal Grandmother        breast, uterine  . Cirrhosis Paternal Grandfather        due to alcohol   Family Psychiatric  History: See admission H&P  Social History:  Social History   Substance and Sexual Activity  Alcohol Use Yes   Comment: BAC was clear     Social History   Substance and Sexual Activity  Drug Use Yes  . Types: Marijuana, Oxycodone   Comment: reports oxycodone and marijuana    Social History   Socioeconomic History  . Marital status: Single    Spouse name: Not on file  . Number of children: Not on file  . Years of education: Not on file  . Highest education level: Not on file  Occupational History  . Occupation: Unemployed  Tobacco Use  . Smoking status: Current Every Day Smoker  Packs/day: 0.50    Types: Cigarettes  . Smokeless tobacco: Never Used  . Tobacco comment: Not ready to quit  Substance and Sexual Activity  . Alcohol use: Yes    Comment: BAC was clear  . Drug use: Yes    Types: Marijuana, Oxycodone    Comment: reports oxycodone and marijuana  . Sexual activity: Yes    Birth control/protection: Implant  Other Topics Concern  . Not on file  Social History Narrative    06/23/2019:  Pt stated that she is homeless, that she is a high school graduate, and that she is unemployed and not followed by any outpatient provider.      Lives with Dad. Mom has LGD, lives in [place]. 11th grader. Dog.   Social Determinants of Health   Financial Resource Strain:   . Difficulty of Paying Living Expenses:   Food Insecurity:   . Worried About Charity fundraiser in the Last Year:   . Arboriculturist in the Last Year:   Transportation Needs:   . Film/video editor (Medical):   [name] Lack of Transportation (Non-Medical):   Physical Activity:   . Days of Exercise per Week:   . Minutes of Exercise per Session:   Stress:   . Feeling of Stress :   Social Connections:   . Frequency of Communication with Friends and Family:   . Frequency of Social Gatherings with Friends and Family:   . Attends Religious Services:   . Active Member of Clubs or Organizations:   . Attends Archivist Meetings:   [name] Marital Status:    Additional Social History:  Pain Medications: Please see MAR Prescriptions: Please see MAR, prozac and cogentin per pt Over the Counter: Please see MAR History of alcohol / drug use?: Yes Longest period of sobriety (when/how long): Unknown Negative Consequences of Use: Legal Name of Substance 1: alcohol 1 - Frequency: UTA 1 - Duration: UTA- pt denies 1 - Last Use / Amount: UTA  Sleep: Good  Appetite:  Good  Current Medications: Current Facility-Administered Medications  Medication Dose Route Frequency Provider Last Rate Last Admin  . acetaminophen (TYLENOL) tablet 650 mg  650 mg Oral Q6H PRN Emmaline Kluver, FNP      . alum & mag hydroxide-simeth (MAALOX/MYLANTA) 200-200-20 MG/5ML suspension 30 mL  30 mL Oral Q4H PRN Emmaline Kluver, FNP   30 mL at 11/22/19 2126  . benztropine (COGENTIN) tablet 1 mg  1 mg Oral BID PRN Oddie Bottger, Myer Peer, MD   1 mg at 11/24/19 2210  . FLUoxetine (PROZAC) capsule 60 mg  60 mg Oral Daily Sharma Covert, MD   60 mg at  11/25/19 0810  . gabapentin (NEURONTIN) capsule 400 mg  400 mg Oral TID Sharma Covert, MD   400 mg at 11/25/19 0810  . haloperidol (HALDOL) tablet 5 mg  5 mg Oral Q6H PRN Leray Garverick, Myer Peer, MD   5 mg at 11/21/19 2158   Or  . haloperidol lactate (HALDOL) injection 5 mg  5 mg Intramuscular Q6H PRN Rickey Sadowski, Myer Peer, MD      . LORazepam (ATIVAN) tablet 1 mg  1 mg Oral Q6H PRN Lindell Spar I, NP   1 mg at 11/25/19 0915   Or  . LORazepam (ATIVAN) injection 1 mg  1 mg Intramuscular Q6H PRN Nwoko, Agnes I, NP      . magnesium hydroxide (MILK OF MAGNESIA) suspension 30 mL  30 mL Oral Daily  PRN Emmaline Kluver, FNP      . nicotine polacrilex (NICORETTE) gum 2 mg  2 mg Oral PRN Lindell Spar I, NP   2 mg at 11/25/19 [id number]  . ondansetron (ZOFRAN-ODT) disintegrating tablet 4 mg  4 mg Oral Q8H PRN Lindell Spar I, NP   4 mg at 11/21/19 1858  . paliperidone (INVEGA) 24 hr tablet 12 mg  12 mg Oral QHS Money, Travis B, FNP   12 mg at 11/24/19 2210  . traZODone (DESYREL) tablet 25 mg  25 mg Oral QHS PRN Stepehn Eckard, Myer Peer, MD   25 mg at 11/24/19 2210   Lab Results:  No results found for this or any previous visit (from the past 48 hour(s)).  Blood Alcohol level:  Lab Results  Component Value Date   ETH <10 10/16/2019   ETH <10 123456   Metabolic Disorder Labs: Lab Results  Component Value Date   HGBA1C 5.4 10/16/2019   MPG 108.28 10/16/2019   MPG 102.54 05/17/2019   Lab Results  Component Value Date   PROLACTIN 113.0 (H) 11/11/2019   PROLACTIN 152.0 (H) 10/16/2019   Lab Results  Component Value Date   CHOL 138 10/16/2019   TRIG 60 10/16/2019   HDL 36 (L) 10/16/2019   CHOLHDL 3.8 10/16/2019   VLDL 12 10/16/2019   LDLCALC 90 10/16/2019   LDLCALC 96 05/17/2019   Physical Findings: AIMS: Facial and Oral Movements Muscles of Facial Expression: None, normal Lips and Perioral Area: None, normal Jaw: None, normal Tongue: None, normal,Extremity Movements Upper (arms, wrists, hands,  fingers): None, normal Lower (legs, knees, ankles, toes): None, normal, Trunk Movements Neck, shoulders, hips: None, normal, Overall Severity Severity of abnormal movements (highest score from questions above): None, normal Incapacitation due to abnormal movements: None, normal Patient's awareness of abnormal movements (rate only patient's report): No Awareness, Dental Status Current problems with teeth and/or dentures?: No Does patient usually wear dentures?: No  CIWA:  CIWA-Ar Total: 1 COWS:  COWS Total Score: 2  Musculoskeletal: Strength & Muscle Tone: within normal limits Gait & Station: normal Patient leans: N/A  Psychiatric Specialty Exam: Physical Exam  Nursing note and vitals reviewed. Constitutional: She is oriented to person, place, and time. She appears well-developed and well-nourished.  HENT:  Head: Normocephalic and atraumatic.  Cardiovascular:  Elevated pulse rate: 104  Respiratory: Effort normal.  Genitourinary:    Genitourinary Comments: Deferred   Musculoskeletal:        General: Normal range of motion.     Cervical back: Normal range of motion.  Neurological: She is alert and oriented to person, place, and time.  Skin: Skin is warm.    Review of Systems  Constitutional: Negative for chills, diaphoresis and fever.  HENT: Negative for congestion, rhinorrhea, sneezing and sore throat.   Eyes: Negative for discharge.  Respiratory: Negative for cough, chest tightness, shortness of breath and wheezing.   Cardiovascular: Negative for chest pain and palpitations.  Gastrointestinal: Negative for diarrhea, nausea and vomiting.  Genitourinary: Negative for difficulty urinating.  Musculoskeletal: Negative.   Allergic/Immunologic: Negative for environmental allergies and food allergies.       Allergies: Abilify, Sertraline (Zoloft)  Neurological: Negative for dizziness, tremors, seizures, syncope, light-headedness and headaches.  Psychiatric/Behavioral: Positive for  dysphoric mood ("Improving"). Negative for agitation, behavioral problems, confusion, decreased concentration, hallucinations, self-injury, sleep disturbance and suicidal ideas. The patient is nervous/anxious. The patient is not hyperactive.   Currently denies dizziness or lightheadedness, headache, no chest pain, no cough, no  vomiting today  Blood pressure 109/71, pulse 92, temperature (!) 97.5 F (36.4 C), temperature source Oral, resp. rate 18, height 5\' 3"  (1.6 m), weight 67.8 kg, SpO2 100 %.Body mass index is 26.48 kg/m.  General Appearance: Casual  Eye Contact:  Good  Speech:  Normal Rate  Volume:  Normal  Mood:  Improved mood, more reactive affect, remains vaguely anxious,   Affect:  As above, affect is noticeably less blunted than initially during this admission  Thought Process:  Linear-no current thought disorders noted  Orientation:  Other:  fully alert and attentive  Thought Content:  Describes significantly improved but not completely resolved auditory hallucinations.  Does not appear internally preoccupied at this time  Suicidal Thoughts:  No denies SI or self injurious ideations  Homicidal Thoughts:  No  Memory:  recent and remote grossly intact   Judgement:  Other:  improving   Insight:  improving   Psychomotor Activity:  Normal no psychomotor agitation or restlessness  Concentration:  Concentration: Good and Attention Span: Good  Recall:  Good  Fund of Knowledge:  Good  Language:  Good  Akathisia:  Negative  Handed:  Right  AIMS (if indicated):   No current abnormal involuntary movements noted or endorsed  Assets:  Desire for Improvement Resilience  ADL's:  Intact  Cognition:  WNL  Sleep:  Number of Hours: 6.25    Assessment-  19 year old female, presented on 4/3 for depression and psychotic symptoms which she described as being mentally tortured by a person who was transmitting thoughts to her.She also describes some auditory hallucinations. She has been  diagnosed with schizophrenia.  Patient continues to present with gradual but noticeable improvement compared to admission.  At this time describes some lingering psychotic symptoms (hallucinations, vague sense of paranoia) but generally much improved compared to admission.  Negative symptoms such as affective blunting are also noted to be significantly improved and patient has been sociable and interacting appropriately with peers.  Focus has been on discharge planning and patient has now been accepted to [facility] as of tomorrow  Treatment Plan Summary: Daily contact with patient to assess and evaluate symptoms and progress in treatment, Medication management and Plan : Patient is seen and examined.  [name] with the above-stated past psychiatric history who is seen in follow-up.  Treatment Plan reviewed as below today 5/13 Encourage group participation Treatment team working on disposition planning - see above   1.  Continue Cogentin 1 mg p.o. twice daily as needed tremors and EPS. 2.  Continue Fluoxetine 60 mg p.o. daily for depression and anxiety. 3.  Continue Gabapentin 400 mg p.o. 3 times daily for anxiety and mood stability. 4.  Continue Haldol as needed & lorazepam as needed for agitation. 5.  Continue Paliperidone 12 mg p.o. daily for psychosis/mood control. 6.  Continue Trazodone  25 mg p.o. nightly as needed insomnia.   Jenne Campus, MD 11/25/2019, 10:59 AM   Patient ID: Julia [name], female   DOB: [birth year], 19 y.o.MRN: [id number]

## 2019-11-25 NOTE — BHH Counselor (Signed)
CSW facilitated phone interview with patient and [facility] ([facility]) English as a second language teacher, Designer, jewellery [phone]). Julia [name] and patient spoke for approximately 25 minutes to overview program expectations and to discuss concerns and questions.  Julia [name] is agreeable to Julia [name] coming back to [facility]. However, Julia [name] will need a photo ID. CSW briefly reviewed barriers faced with obtaining ID for this patient. Julia [name] states that [facility] is agreeable to accept patient with a letter on [facility] letterhead stating that patient has been inpatient for at least 10 days and patient is working to obtain a Clinical research associate ID. CSW will discuss this with leadership.  Patient has previously obtained an [place] ID. CSW, Julia [name] assisted patient in ordering a duplicate copy of her state ID at no cost to the patient. Patient requested the duplicate to be mailed to her father's address, rather than her ACT Team's business office or the [facility].   [facility] will require patient to quarantine for 7 days, prior to entering the shelter program. Afterward, patient will not be able to leave the shelter for her first 7 days. Patient will need an adequate supply of medications at discharge.   CSW attempted to reach patient's ACTT Therapist, Julia [name] ([phone]) to provide updates and inquire as to how patient can continue to receive ACTT services to ensure a continuation of care. [facility] serves [facility], however patient receives [facility] out of [place]. CSW left Julia [name] a detailed voicemail asking for a returned call to facilitate a transfer of services or to inquire if patient's ACT Team can continue to follow her at [facility].  Patient is agreeable to discharge plan and is agreeable to discharge tomorrow to ensure services are established.  Julia [name], MSW, LCSW-A Clinical Social Worker [facility] Adult Unit

## 2019-11-25 NOTE — Progress Notes (Addendum)
Pt reported that she was feeling anxious and prn anti anxiety medication administered.  Pt interacting with peers and she has been calm and cooperative this shift.  Pt stated that she is excited about going to [facility] and she is ready for discharge which is scheduled for tomorrow.  RN provided reassurance and administered pt's medications as prescribed.  RN will continue to monitor and provide support as needed.

## 2019-11-25 NOTE — Progress Notes (Signed)
Adult Psychoeducational Group Note  Date:  11/25/2019 Time:  9:20 PM  Group Topic/Focus:  Wrap-Up Group:   The focus of this group is to help patients review their daily goal of treatment and discuss progress on daily workbooks.  Participation Level:  Active  Participation Quality:  Appropriate  Affect:  Appropriate  Cognitive:  Alert  Insight: Appropriate  Engagement in Group:  Engaged  Modes of Intervention:  Discussion  Additional Comments:  The pt reports that her day was a 9/10. The pt reports that she has enjoyed meeting new people today. The pt stated that the hardest thing about being here is not being able to smoke.   Wynelle Fanny R 11/25/2019, 9:20 PM

## 2019-11-26 MED ORDER — FLUOXETINE HCL 20 MG PO CAPS: 60.0000 mg | ORAL_CAPSULE | Freq: Every day | ORAL | 0 refills | Status: DC

## 2019-11-26 MED ORDER — BENZTROPINE MESYLATE 1 MG PO TABS: 1.0000 mg | ORAL_TABLET | Freq: Two times a day (BID) | ORAL | 0 refills | Status: DC | PRN

## 2019-11-26 MED ORDER — PALIPERIDONE ER 6 MG PO TB24: 12.0000 mg | ORAL_TABLET | Freq: Every day | ORAL | 0 refills | Status: DC

## 2019-11-26 MED ORDER — NICOTINE POLACRILEX 2 MG MT GUM: 2.0000 mg | CHEWING_GUM | OROMUCOSAL | 0 refills | Status: DC | PRN

## 2019-11-26 MED ORDER — TRAZODONE HCL 50 MG PO TABS: 25.0000 mg | ORAL_TABLET | Freq: Every evening | ORAL | 0 refills | Status: DC | PRN

## 2019-11-26 NOTE — Progress Notes (Signed)
  [facility] Adult Case Management Discharge Plan :  Will you be returning to the same living situation after discharge:  No. Patient is discharging to [facility].  At discharge, do you have transportation home?: Yes,  CSW will arrange Safe Transport Do you have the ability to pay for your medications: Yes,  Medicaid  Release of information consent forms completed and in the chart;  Patient's signature needed at discharge.  Patient to Follow up at: Follow-up [facility]. Follow up on 11/29/2019.   Why: Your ACTT providers will meet with you in the community after discharge. Please contact, [name] at [phone]. Your appointment with your ACTT team and psychiatist, is Monday, 11/29/19 at 1:00pm. Be sure to have your discharge paperwork avaialble.  Contact information: [place] [address] [phone]           Next level of care provider has access to [place] and Suicide Prevention discussed: Yes,  with the patient's father     Has patient been referred to the Quitline?: Patient refused referral  Patient has been referred for addiction treatment: Pt. refused referral  Marylee Floras, [place] 11/26/2019, 11:20 AM

## 2019-11-26 NOTE — Progress Notes (Signed)
Supervisor spoke with [name] at [facility].  She is unable to provide transportation for pt to [facility].  She also found out that [facility] team in [place] is currently full, so her team from [place] will continue to follow pt for now until a local team can be identified.  She gave the following appts: MD appt virtual Tuesday 1pm.  Pt phone number was given to [name] (pt has new phone brought by her father) and they can do the appt through this phone.  ACT team will also come and meet in person with pt either Monday or Tuesday at [facility].   [name], MSW, LCSW Advanced Care Supervisor 11/26/2019 4:34 PM

## 2019-11-26 NOTE — BHH Suicide Risk Assessment (Addendum)
[facility] Discharge Suicide Risk Assessment   Principal Problem: Schizophrenia, paranoid ([place]) Discharge Diagnoses: Principal Problem:   Schizophrenia, paranoid ([place]) Active Problems:   Depressed mood   Psychosis ([place])   MDD (major depressive disorder)   Total Time spent with patient: 30 minutes  Musculoskeletal: Strength & Muscle Tone: within normal limits Gait & Station: normal Patient leans: N/A  Psychiatric Specialty Exam: Review of Systems no headache, no dizziness or lightheadedness no chest pain, no cough, no vomiting, no rash  Blood pressure (!) 90/54, pulse (!) 128, temperature (!) 97.5 F (36.4 C), temperature source Oral, resp. rate 18, height 5\' 3"  (1.6 m), weight 67.8 kg, SpO2 100 %.Body mass index is 26.48 kg/m.  General Appearance: Casual  Eye Contact::  Good  Speech:  Normal Rate409  Volume:  Normal  Mood:  Reports feeling better, currently denies depression  Affect:  Vaguely anxious but generally improved compared to admission, noticeably less blunted and more reactive  Thought Process:  Linear and Descriptions of Associations: Circumstantial  Orientation:  Full (Time, Place, and Person)  Thought Content:  Denies any hallucinations today and currently does not appear internally preoccupied  Suicidal Thoughts:  No denies suicidal or self-injurious ideations  Homicidal Thoughts:  No denies homicidal or violent ideations  Memory:  Recent and remote grossly intact  Judgement: Improving  Insight:  Fair /improving  Psychomotor Activity:  Normal no psychomotor agitation or restlessness  Concentration:  Good  Recall:  Good  Fund of Knowledge:Good  Language: Good  Akathisia:  Negative  Handed:  Right  AIMS (if indicated):     Assets:  Communication Skills Desire for Improvement Resilience  Sleep:  Number of Hours: 7  Cognition: WNL  ADL's:  Intact   Mental Status Per Nursing Assessment::   On Admission:  Suicidal ideation indicated by patient  Demographic  Factors:  Single, no children, currently homeless, unemployed  Loss Factors: Limited support network, homelessness, chronic mental illness  Historical Factors: History of prior psychiatric admissions.  Has been diagnosed with schizoaffective disorder in the past.  History of substance abuse.  History of suicide attempt at age 19  Risk Reduction Factors:   Positive coping skills or problem solving skills  Continued Clinical Symptoms:  Today patient presents alert, attentive, calm without psychomotor agitation, good eye contact.  Endorses improvement compared to admission.  Denies depression.  Affect is less blunted, has been noted to smile/laugh at times during interactions with peers and staff.  Presents vaguely anxious.  Thought processes linear /somewhat concrete/circumstantial.  No hallucinations today and does not appear internally preoccupied at this time.  Denies SI, denies HI, currently future oriented. She is not endorsing medication side effects.  We have reviewed side effect profile to include risk of metabolic, motor side effects. No disruptive or agitated behaviors on unit  Cognitive Features That Contribute To Risk:  No gross cognitive deficits noted upon discharge. Is alert , attentive, and oriented x 3    Suicide Risk:  Mild:  Suicidal ideation of limited frequency, intensity, duration, and specificity.  There are no identifiable plans, no associated intent, mild dysphoria and related symptoms, good self-control (both objective and subjective assessment), few other risk factors, and identifiable protective factors, including available and accessible social support.  Follow-up [facility]. Follow up on 11/24/2019.   Why: Your ACTT providers will meet with you in the community after discharge. Contact information: [address] [place] [address]  [phone]           Plan Of Care/Follow-up recommendations:   Activity:  As tolerated Diet:  Regular Tests:  NA Other:  See below  Today patient is expressing readiness for discharge.  The current plan is for her to go to [facility].  She does states she may decide to move in with a friend eventually but for now plans to stay at [facility].  She is followed by ACT team.   Jenne Campus, MD 11/26/2019, 10:48 AM

## 2019-11-26 NOTE — Discharge Summary (Addendum)
Physician Discharge Summary Note  Patient:  Julia [name] is an 19 y.o., female  MRN:  [id number]  DOB:  2000-10-22  Patient phone:  [phone] (home)   Patient address:   [place] [address],   Total Time spent with patient: Greater than 30 minutes  Date of Admission:  10/15/2019  Date of Discharge: 11/25/12/21  Reason for Admission: Worsening symptoms of depression & suicidal ideations.  Principal Problem: Schizophrenia.  Discharge Diagnoses: Principal Problem:   Schizophrenia, paranoid ([place]) Active Problems:   Depressed mood   Psychosis ([place])   MDD (major depressive disorder)  Past Psychiatric History: Schizophrenia, Major depressive disorder  Past Medical History:  Past Medical History:  Diagnosis Date  . Burning with urination 05/03/2015  . Contraceptive management 05/03/2015  . Depression   . Dysmenorrhea 12/30/2013  . Heroin addiction ([place])   . Menorrhagia 12/30/2013  . Menstrual extraction 12/30/2013  . Migraines   . Social anxiety disorder 09/25/2016  . Suicidal ideations   . Vaginal odor 05/03/2015    Past Surgical History:  Procedure Laterality Date  . NO PAST SURGERIES     Family History:  Family History  Problem Relation Age of Onset  . Depression Mother   . Hypertension Father   . Hyperlipidemia Father   . Cancer Paternal Grandmother        breast, uterine  . Cirrhosis Paternal Grandfather        due to alcohol   Family Psychiatric  History: See H&P  Social History:  Social History   Substance and Sexual Activity  Alcohol Use Yes   Comment: BAC was clear     Social History   Substance and Sexual Activity  Drug Use Yes  . Types: Marijuana, Oxycodone   Comment: reports oxycodone and marijuana    Social History   Socioeconomic History  . Marital status: Single    Spouse name: Not on file  . Number of children: Not on file  . Years of education: Not on file  . Highest education level: Not on file  Occupational  History  . Occupation: Unemployed  Tobacco Use  . Smoking status: Current Every Day Smoker    Packs/day: 0.50    Types: Cigarettes  . Smokeless tobacco: Never Used  . Tobacco comment: Not ready to quit  Substance and Sexual Activity  . Alcohol use: Yes    Comment: BAC was clear  . Drug use: Yes    Types: Marijuana, Oxycodone    Comment: reports oxycodone and marijuana  . Sexual activity: Yes    Birth control/protection: Implant  Other Topics Concern  . Not on file  Social History Narrative   06/23/2019:  Pt stated that she is homeless, that she is a high school graduate, and that she is unemployed and not followed by any outpatient provider.      Lives with Dad. Mom has LGD, lives in [place]. 11th grader. Dog.   Social Determinants of Health   Financial Resource Strain:   . Difficulty of Paying Living Expenses:   Food Insecurity:   . Worried About Charity fundraiser in the Last Year:   . Arboriculturist in the Last Year:   Transportation Needs:   . Film/video editor (Medical):   [name] Lack of Transportation (Non-Medical):   Physical Activity:   . Days of Exercise per Week:   . Minutes of Exercise per Session:   Stress:   . Feeling of Stress :  Social Connections:   . Frequency of Communication with Friends and Family:   . Frequency of Social Gatherings with Friends and Family:   . Attends Religious Services:   . Active Member of Clubs or Organizations:   . Attends Archivist Meetings:   [name] Marital Status:    Hospital Course: (Per Md's admission evaluation): 19 year old female, known to [facility] from prior admissions, presented to [facility] voluntarily reporting suicidal ideations, worsening depression over the last month or so, neuro-vegetative symptoms". She also presents with psychotic symptoms as noted below. She had been staying in a [facility] over the last month or so, but was kicked out 2- days ago due to an altercation with other clients and she is now homeless. She  reports history of intermittent suicidal ideations , recently worsening . Describes these as passive. She reports she feels she is " being tortured " by a man . States she has never actually met this person- " I don't know who he is but he is a pedophile , he puts thoughts in my head, harasses me sexually"  She states "it is hard to explain , he thinks about those things and about me too at the same time". She states she sometimes hears his voice , saying " you are Daddy's little girl".  This is one of several psychiatric discharge summaries from the [place] for this 19 year old Caucasian female with hx of chronic mental illness, substance use disorders & multiple psychiatric admissions. She is known in this [facility] & within the [facility] systems from her previous admissions for mood stabilization as well as substance detoxification treatments. Tniyah has been tried on multiple psychotropic medications for her symptoms & it appeared she has not been compliant with her recommended treatment regimen due to her drug use, homelessness issues & other behavioral problems. She was brought to the hospital this time around for evaluation & treatment for worsening depression & suicide ideations.  After evaluation of her presenting symptoms, Marya was recommended for mood stabilization treatments. The medication regimen for her presenting symptoms were discussed & with her consent initiated. She received, stabilized & was discharged on the medications as listed below on her discharge medication lists. She was also enrolled & participated in the group counseling sessions being offered & held on this unit. She learned coping skills. She presented on this admission, no other chronic medical conditions that required treatment & monitoring. She tolerated her treatment regimen without any adverse effects or reactions reported.  It took quite some time, several medication changes & adjustment to get Xareni's symptoms  stabilized warranting this discharge. Sonni is currently mentally & medically stable to be discharged to continue mental health care on an outpatient basis as noted below. During the course of her hospitalization, the 15-minute checks were adequate to ensure Lenka's safety. Patient did display frequent outburst from time to time during her hospital stay. She was at a time placed on 1:1 supervision, close observation & at a point was stationed at the locked unit due to behavioral issues. She did however, managed to interact with the other patients & staff appropriately on her good days. She participated appropriately in the group sessions/therapies. Her medications were addressed & adjusted to meet her needs. She was recommended for outpatient follow-up care & medication management upon discharge to assure her continuity of care.  At the time of discharge, patient is not reporting any acute suicidal/homicidal ideations. She feels more confident about her  self-care & in managing her symptoms. She currently denies any new issues or concerns. Education and supportive counseling provided throughout her hospital stay & upon discharge.  Today upon her discharge evaluation with the attending psychiatrist, Nichole shares she is doing well. She denies any other specific concerns. She is sleeping well. Her appetite is good. She denies other physical complaints. She denies AH/VH. She feels that her medications have been helpful & is in agreement to continue her current treatment regimen as recommended. She was able to engage in safety planning including plan to return to [facility] or contact emergency services if she feels unable to maintain her own safety or the safety of others. Pt had no further questions, comments, or concerns. She left [facility] with all personal belongings in no apparent distress. Transportation per [employer].  Musculoskeletal: Strength & Muscle Tone: within normal limits Gait & Station:  normal Patient leans: N/A  Psychiatric Specialty Exam: Physical Exam  Nursing note and vitals reviewed. Constitutional: She is oriented to person, place, and time. She appears well-developed.  HENT:  Head: Normocephalic.  Eyes: Pupils are equal, round, and reactive to light.  Cardiovascular:  Elevated pulse rate  Respiratory: No respiratory distress. She has no wheezes.  GI: Soft.  Genitourinary:    Genitourinary Comments: Deferred   Musculoskeletal:        General: Normal range of motion.     Cervical back: Normal range of motion.  Neurological: She is alert and oriented to person, place, and time.  Skin: Skin is warm and dry.    Review of Systems  Constitutional: Negative for chills and fever.  HENT: Negative.   Eyes: Negative.   Respiratory: Negative for cough, shortness of breath and wheezing.   Cardiovascular: Negative for chest pain and palpitations.  Gastrointestinal: Negative for abdominal pain, heartburn, nausea and vomiting.  Genitourinary: Negative.   Musculoskeletal: Negative.   Skin: Negative.   Neurological: Negative for dizziness and headaches.  Endo/Heme/Allergies: Negative.   Psychiatric/Behavioral: Positive for depression (Stabilized with medication prior to discharge), hallucinations (Hx. psychosis (stabilized with medication prior to discharge)) and substance abuse (Hx. Amphetamine, Cocaine & THC disorders). Negative for memory loss and suicidal ideas. The patient has insomnia (Stabilized with medication prior to discharge). The patient is not nervous/anxious (Stable).     Blood pressure (!) 90/54, pulse (!) 128, temperature (!) 97.5 F (36.4 C), temperature source Oral, resp. rate 18, height '5\' 3"'$  (1.6 m), weight 67.8 kg, SpO2 100 %.Body mass index is 26.48 kg/m.  See Md's discharge SRA  Sleep:  Number of Hours: 7   Has this patient used any form of tobacco in the last 30 days? (Cigarettes, Smokeless Tobacco, Cigars, and/or Pipes): No  Blood Alcohol  level:  Lab Results  Component Value Date   ETH <10 10/16/2019   ETH <10 46/28/6381   Metabolic Disorder Labs:  Lab Results  Component Value Date   HGBA1C 5.4 10/16/2019   MPG 108.28 10/16/2019   MPG 102.54 05/17/2019   Lab Results  Component Value Date   PROLACTIN 113.0 (H) 11/11/2019   PROLACTIN 152.0 (H) 10/16/2019   Lab Results  Component Value Date   CHOL 138 10/16/2019   TRIG 60 10/16/2019   HDL 36 (L) 10/16/2019   CHOLHDL 3.8 10/16/2019   VLDL 12 10/16/2019   LDLCALC 90 10/16/2019   [place] 96 05/17/2019   See Psychiatric Specialty Exam and Suicide Risk Assessment completed by Attending Physician prior to discharge.  Discharge destination:  Home  Is patient on multiple antipsychotic therapies at discharge:  No   Has Patient had three or more failed trials of antipsychotic monotherapy by history:  No  Recommended Plan for Multiple Antipsychotic Therapies: NA  Allergies as of 11/26/2019      Reactions   Abilify [aripiprazole]    Zoloft [sertraline Hcl]       Medication List    STOP taking these medications   gabapentin 400 MG capsule Commonly known as: NEURONTIN   haloperidol decanoate 100 MG/ML injection Commonly known as: HALDOL DECANOATE   ibuprofen 400 MG tablet Commonly known as: ADVIL   paliperidone 156 MG/ML Susy injection Commonly known as: INVEGA SUSTENNA     TAKE these medications     Indication  benztropine 1 MG tablet Commonly known as: COGENTIN Take 1 tablet (1 mg total) by mouth 2 (two) times daily as needed for tremors (EPS). What changed:   when to take this  reasons to take this  Indication: Extrapyramidal Reaction caused by Medications   FLUoxetine 20 MG capsule Commonly known as: PROZAC Take 3 capsules (60 mg total) by mouth daily. For depression Start taking on: Nov 27, 2019 What changed:   how much to take  additional instructions  Indication: Major Depressive Disorder   nicotine polacrilex 2 MG gum Commonly  known as: NICORETTE Take 1 each (2 mg total) by mouth as needed. (May buy from over the counter): For smoking cessation  Indication: Nicotine Addiction   paliperidone 6 MG 24 hr tablet Commonly known as: INVEGA Take 2 tablets (12 mg total) by mouth at bedtime. For mood control What changed:   medication strength  how much to take  when to take this  additional instructions  Indication: Mood control   traZODone 50 MG tablet Commonly known as: DESYREL Take 0.5 tablets (25 mg total) by mouth at bedtime as needed for sleep. What changed:   how much to take  when to take this  reasons to take this  Indication: [place]. Follow up on 11/24/2019.   Why: Your ACTT providers will meet with you in the community after discharge. Contact information: [place] [address] [phone]          Follow-up recommendations: Activity:  As tolerated Diet: As recommended by your primary care doctor. Keep all scheduled follow-up appointments as recommended.  Comments: Prescriptions given at discharge.  Patient agreeable to plan.  Given opportunity to ask questions.  Appears to feel comfortable with discharge denies any current suicidal or homicidal thought. Patient is also instructed prior to discharge to: Take all medications as prescribed by his/her mental healthcare provider. Report any adverse effects and or reactions from the medicines to his/her outpatient provider promptly. Patient has been instructed & cautioned: To not engage in alcohol and or illegal drug use while on prescription medicines. In the event of worsening symptoms, patient is instructed to call the crisis hotline, 911 and or go to the nearest ED for appropriate evaluation and treatment of symptoms. To follow-up with his/her primary care provider for your other medical issues, concerns and or health care  needs.  Signed: Lindell Spar, NP, PMHNP, FNP-BC 11/26/2019, 8:58 AM   Patient seen, Suicide Assessment Completed.  Disposition Plan Reviewed

## 2019-11-26 NOTE — Progress Notes (Signed)
Patient ID: [name], female   DOB: 17-Jul-2000, 19 y.o.   MRN: [id number] Discharge Note:  Patient denies SI/HI AVH at this time. Discharge instructions, AVS, prescriptions, samples and transition record gone over with patient. Patient agrees to comply with medication management, follow-up visit, and outpatient therapy. Patient belongings returned to patient. Patient questions and concerns addressed and answered.  Patient ambulatory off unit.  Patient discharged,.

## 2019-11-26 NOTE — Progress Notes (Signed)
   11/26/19 0640  Vital Signs  Pulse Rate 96  BP 104/74  BP Location Left Arm  BP Method Automatic  Patient Position (if appropriate) Sitting   D: Patient presents with a flat affect. Patient took all medications. Patient out in open areas. Patient denies SI/HI/AVH. A: No adverse drug reactions. R:  Safety maintained

## 2019-11-26 NOTE — [facility] Counselor (Signed)
CSW contacted the patient's ACTT Therapist, [name] [phone]) to provide the patient's new cell phone number. There were no further questions or concerns. Patient will continue to receive ACTT services through [facility].    CSW attempted to notify Edwina Barth with the [facility] to inform her that the patient was en route to their facility. There was no answer. CSW left a HIPAA compliant voicemail.   There were no further questions or concerns at this time.    CSW will continue to follow for a safe discharge.    Radonna Ricker, MSW, LCSW Clinical Social Worker [facility]  Phone: [phone]

## 2019-11-26 NOTE — Progress Notes (Signed)
Recreation Therapy Notes  Date: 5.14.21 Time: 0930 Location: 300 Hall Dayroom  Group Topic: Stress Management  Goal Area(s) Addresses:  Patient will identify positive stress management techniques. Patient will identify benefits of using stress management post d/c.  Behavioral Response: Engaged  Intervention: Stress Management  Activity: Progressive Muscle Relaxation.  LRT read a script that lead patients in tensing and relaxing each muscle group one at a time.  Patients were to follow along as LRT read script to engage in the activity.   Education:  Stress Management, Discharge Planning.   Education Outcome: Acknowledges Education  Clinical Observations/Feedback: Pt attended and participated in group activity.     [name], LRT/CTRS    [name] A 11/26/2019 11:16 AM

## 2019-12-17 ENCOUNTER — Encounter (HOSPITAL_COMMUNITY): Payer: Self-pay | Admitting: Psychiatric/Mental Health

## 2019-12-17 ENCOUNTER — Telehealth (INDEPENDENT_AMBULATORY_CARE_PROVIDER_SITE_OTHER): Payer: Medicaid Other | Admitting: Psychiatric/Mental Health

## 2019-12-17 ENCOUNTER — Other Ambulatory Visit: Payer: Self-pay

## 2019-12-17 DIAGNOSIS — F314 Bipolar disorder, current episode depressed, severe, without psychotic features: Secondary | ICD-10-CM | POA: Diagnosis not present

## 2019-12-17 DIAGNOSIS — F200 Paranoid schizophrenia: Secondary | ICD-10-CM | POA: Diagnosis not present

## 2019-12-17 DIAGNOSIS — R4589 Other symptoms and signs involving emotional state: Secondary | ICD-10-CM

## 2019-12-17 MED ORDER — FLUOXETINE HCL 20 MG PO CAPS: 60.0000 mg | ORAL_CAPSULE | Freq: Every day | ORAL | 0 refills | Status: DC

## 2019-12-17 MED ORDER — PALIPERIDONE ER 6 MG PO TB24: 12.0000 mg | ORAL_TABLET | Freq: Every day | ORAL | 1 refills | Status: DC

## 2019-12-17 MED ORDER — TRAZODONE HCL 50 MG PO TABS: 50.0000 mg | ORAL_TABLET | Freq: Every day | ORAL | 1 refills | Status: DC

## 2019-12-17 MED ORDER — BENZTROPINE MESYLATE 1 MG PO TABS: 1.0000 mg | ORAL_TABLET | Freq: Two times a day (BID) | ORAL | 0 refills | Status: DC | PRN

## 2019-12-17 NOTE — Patient Instructions (Signed)
Please continue with medication as prescribed.

## 2019-12-19 NOTE — Progress Notes (Signed)
Psychiatric Initial Adult Assessment   Patient Identification: Julia [name] MRN:  [id number] Date of Evaluation:  12/19/2019 Referral Source:  Chief Complaint:  " Im here for my appt" Visit Diagnosis:    ICD-10-CM   1. Schizophrenia, paranoid ([place])  F20.0 paliperidone (INVEGA) 6 MG 24 hr tablet    benztropine (COGENTIN) 1 MG tablet  2. Bipolar 1 disorder, depressed, severe ([facility])  F31.4   3. Depressed mood  R45.89 FLUoxetine (PROZAC) 20 MG capsule    traZODone (DESYREL) 50 MG tablet    History of Present Illness:  19 y.o., female presented to [facility] for medication management appointment after a hospitalization at [facility] 10/15/2018.  On assessment patient states that she has been feeling and doing much better on the medication.  She was anxious about this appt because her medication is about to run out.  Patient is observed to have a flat affect during the assessment and was unable to recall the day of the week. However she was able to state what medications she currently taking. At discharge she was prescribes 12mg  of invega daily, 50mg  of trazodone QHS, and 0.5 congentin 2x daily.  Medications are refilled by writer for 2 months to be continued for  stabilization.  Today patient is alert/oriented x3, calm/cooperative with flat affect.  Patient denies suicidal/homicidal ideation, thoughts of self injury, psychosis, and paranoia.  Patient reports no withdrawal symptoms and is taking medication as prescribed.  Patient was able to answer questions appropriately.    Associated Signs/Symptoms: Depression Symptoms:  na (Hypo) Manic Symptoms:  na Anxiety Symptoms:  na Psychotic Symptoms:  Denies AVH PTSD Symptoms: NA  Past Psychiatric History: Schizophrenia   Previous Psychotropic Medications: Yes   Substance Abuse History in the last 12 months:  No.  Consequences of Substance Abuse: NA  Past Medical History:  Past Medical History:  Diagnosis Date  . Burning with urination 05/03/2015  .  Contraceptive management 05/03/2015  . Depression   . Dysmenorrhea 12/30/2013  . Heroin addiction ([place])   . Menorrhagia 12/30/2013  . Menstrual extraction 12/30/2013  . Migraines   . Social anxiety disorder 09/25/2016  . Suicidal ideations   . Vaginal odor 05/03/2015    Past Surgical History:  Procedure Laterality Date  . NO PAST SURGERIES      Family Psychiatric History: unknown  Family History:  Family History  Problem Relation Age of Onset  . Depression Mother   . Hypertension Father   . Hyperlipidemia Father   . Cancer Paternal Grandmother        breast, uterine  . Cirrhosis Paternal Grandfather        due to alcohol    Social History:   Social History   Socioeconomic History  . Marital status: Single    Spouse name: Not on file  . Number of children: Not on file  . Years of education: Not on file  . Highest education level: Not on file  Occupational History  . Occupation: Unemployed  Tobacco Use  . Smoking status: Current Every Day Smoker    Packs/day: 0.50    Types: Cigarettes  . Smokeless tobacco: Never Used  . Tobacco comment: Not ready to quit  Substance and Sexual Activity  . Alcohol use: Yes    Comment: BAC was clear  . Drug use: Yes    Types: Marijuana, Oxycodone    Comment: reports oxycodone and marijuana  . Sexual activity: Yes    Birth control/protection: Implant  Other Topics Concern  .  Not on file  Social History Narrative   06/23/2019:  Pt stated that she is homeless, that she is a high school graduate, and that she is unemployed and not followed by any outpatient provider.      Lives with Dad. Mom has LGD, lives in [place]. 11th grader. Dog.   Social Determinants of Health   Financial Resource Strain:   . Difficulty of Paying Living Expenses:   Food Insecurity:   . Worried About Charity fundraiser in the Last Year:   . Arboriculturist in the Last Year:   Transportation Needs:   . Film/video editor (Medical):   [name] Lack of  Transportation (Non-Medical):   Physical Activity:   . Days of Exercise per Week:   . Minutes of Exercise per Session:   Stress:   . Feeling of Stress :   Social Connections:   . Frequency of Communication with Friends and Family:   . Frequency of Social Gatherings with Friends and Family:   . Attends Religious Services:   . Active Member of Clubs or Organizations:   . Attends Archivist Meetings:   [name] Marital Status:     Additional Social History: previously homeless  Allergies:   Allergies  Allergen Reactions  . Abilify [Aripiprazole]   . Zoloft [Sertraline Hcl]     Metabolic Disorder Labs: Lab Results  Component Value Date   HGBA1C 5.4 10/16/2019   MPG 108.28 10/16/2019   MPG 102.54 05/17/2019   Lab Results  Component Value Date   PROLACTIN 113.0 (H) 11/11/2019   PROLACTIN 152.0 (H) 10/16/2019   Lab Results  Component Value Date   CHOL 138 10/16/2019   TRIG 60 10/16/2019   HDL 36 (L) 10/16/2019   CHOLHDL 3.8 10/16/2019   VLDL 12 10/16/2019   LDLCALC 90 10/16/2019   LDLCALC 96 05/17/2019   Lab Results  Component Value Date   TSH 1.981 05/17/2019    Therapeutic Level Labs: No results found for: LITHIUM No results found for: CBMZ No results found for: VALPROATE  Current Medications: Current Outpatient Medications  Medication Sig Dispense Refill  . benztropine (COGENTIN) 1 MG tablet Take 1 tablet (1 mg total) by mouth 2 (two) times daily as needed for tremors (EPS). 60 tablet 0  . FLUoxetine (PROZAC) 20 MG capsule Take 3 capsules (60 mg total) by mouth daily. For depression 90 capsule 0  . nicotine polacrilex (NICORETTE) 2 MG gum Take 1 each (2 mg total) by mouth as needed. (May buy from over the counter): For smoking cessation 1 tablet 0  . paliperidone (INVEGA) 6 MG 24 hr tablet Take 2 tablets (12 mg total) by mouth at bedtime. For mood control 60 tablet 1  . traZODone (DESYREL) 50 MG tablet Take 1 tablet (50 mg total) by mouth at bedtime. 30  tablet 1   No current facility-administered medications for this visit.    Musculoskeletal: Strength & Muscle Tone: within normal limits Gait & Station: normal Patient leans: N/A  Psychiatric Specialty Exam: Review of Systems  Psychiatric/Behavioral: Negative for hallucinations, sleep disturbance and suicidal ideas. The patient is nervous/anxious.   All other systems reviewed and are negative.   There were no vitals taken for this visit.There is no height or weight on file to calculate BMI.  General Appearance: Casual  Eye Contact:  Fair  Speech:  Clear and Coherent  Volume:  Normal  Mood:  Euphoric  Affect:  Flat  Thought Process:  Coherent and  Descriptions of Associations: Intact  Orientation:  Other:  could not recall the day of the week  Thought Content:  Logical  Suicidal Thoughts:  No  Homicidal Thoughts:  No  Memory:  Immediate;   Good  Judgement:  Fair  Insight:  Fair  Psychomotor Activity:  Normal  Concentration:  Concentration: Fair  Recall:  [place] of Knowledge:Good  Language: Good  Akathisia:  NA  Handed:  Right  AIMS (if indicated):  not done  Assets:  Resilience Social Support  ADL's:  Intact  Cognition: WNL  Sleep:  Good   Screenings: AIMS     Admission (Discharged) from OP Visit from 10/15/2019 in [facility] [facility] Admission (Discharged) from 06/23/2019 in [place] Admission (Discharged) from 05/16/2019 in [facility] Admission (Discharged) from OP Visit from 02/16/2019 in [facility] Admission (Discharged) from 01/06/2018 in [facility]  AIMS Total Score  0  0  0  0  0    AUDIT     Admission (Discharged) from OP Visit from 10/15/2019 in [place] [facility] Admission (Discharged) from 07/10/2019 in [place] Admission (Discharged) from 06/23/2019 in [place] Admission (Discharged) from 05/16/2019 in [facility] [facility] Admission (Discharged) from OP Visit from 02/16/2019 in [place] [facility]  Alcohol Use Disorder Identification Test Final Score (AUDIT)  0  0  0  3  0    PHQ2-9     Office Visit from 07/11/2016 in [facility]  PHQ-2 Total Score  0  PHQ-9 Total Score  0      Assessment and Plan: Patient is to continue on medication 12mg  oinvega dalt, 50mg  trazodone qhs, and 0.5 cogentin 2x daily to manage schizophrenia. Patient is to follow up in 8 week for medication management appointment.   [name], NP 6/6/20213:29 AM

## 2020-01-19 ENCOUNTER — Other Ambulatory Visit (HOSPITAL_COMMUNITY)
Admission: RE | Admit: 2020-01-19 | Discharge: 2020-01-19 | Disposition: A | Discharge status: Home / Self Care | Payer: Medicaid Other | Source: Ambulatory Visit | Attending: Obstetrics and Gynecology | Admitting: Obstetrics and Gynecology

## 2020-01-19 ENCOUNTER — Ambulatory Visit (INDEPENDENT_AMBULATORY_CARE_PROVIDER_SITE_OTHER): Payer: Medicaid Other | Admitting: Women's Health

## 2020-01-19 ENCOUNTER — Encounter: Payer: Self-pay | Admitting: Women's Health

## 2020-01-19 VITALS — BP 123/79 | HR 103 | Ht 63.0 in | Wt 174.0 lb

## 2020-01-19 DIAGNOSIS — Z3046 Encounter for surveillance of implantable subdermal contraceptive: Secondary | ICD-10-CM | POA: Diagnosis not present

## 2020-01-19 DIAGNOSIS — Z30011 Encounter for initial prescription of contraceptive pills: Secondary | ICD-10-CM

## 2020-01-19 DIAGNOSIS — Z113 Encounter for screening for infections with a predominantly sexual mode of transmission: Secondary | ICD-10-CM

## 2020-01-19 MED ORDER — LO LOESTRIN FE 1 MG-10 MCG / 10 MCG PO TABS
1.0000 | ORAL_TABLET | Freq: Every day | ORAL | 3 refills | Status: DC
Start: 2020-01-19 — End: 2020-03-16

## 2020-01-19 NOTE — Progress Notes (Signed)
   [place] REMOVAL Patient name: Julia [name] MRN [id number]  Date of birth: [birth year] Subjective Findings:   SONDA COPPENS is a 19 y.o. G26P0 Caucasian female being seen today for removal of a Nexplanon. Her Nexplanon was placed 03/2016 in [place].  She desires removal because it is past due. Does not want UPT. Wants STD screen, but not pelvic exam- offered self-swab and she wants to do this. Signed copy of informed consent in chart.   No LMP recorded. Patient has had an implant. Last pap <20yo. Results were:  n/a The planned method of family planning is OCP (estrogen/progesterone). Does smoke, no h/o HTN, DVT/PE, CVA, MI, or migraines w/ aura.  Depression screen [facility] 2/9 07/11/2016  Decreased Interest 0  Down, Depressed, Hopeless 0  PHQ - 2 Score 0  Altered sleeping 0  Tired, decreased energy 0  Change in appetite 0  Feeling bad or failure about yourself  0  Trouble concentrating 0  Moving slowly or fidgety/restless 0  Suicidal thoughts 0  PHQ-9 Score 0    Pertinent History Reviewed:   Reviewed past medical,surgical, social, obstetrical and family history.  Reviewed problem list, medications and allergies. Objective Findings & Procedure:    Vitals:   01/19/20 1427  BP: 123/79  Pulse: (!) 103  Weight: 174 lb (78.9 kg)  Height: 5\' 3"  (1.6 m)  Body mass index is 30.82 kg/m.  No results found for this or any previous visit (from the past 24 hour(s)).   Time out was performed.  Nexplanon site identified.  Area prepped in usual sterile fashon. One cc of 2% lidocaine was used to anesthetize the area at the distal end of the implant. A small stab incision was made right beside the implant on the distal portion.  The Nexplanon rod was grasped using hemostats and removed without difficulty.  There was less than 3 cc blood loss. Pt vomited as soon as Nexplanon was removed, states she gets sick w/ 'stuff like this'.  Steri-strips were applied over the small incision and a pressure bandage  was applied.  Assessment & Plan:   1) Nexplanon removal She was instructed to keep the area clean and dry, remove pressure bandage in 24 hours, and keep insertion site covered with the steri-strip for 3-5 days.   Follow-up PRN problems.  2) Contraception management> rx LoLoestrin, condoms x 2wks, always for STI prevention  3) STD screen> self-collected CV swab sent  No orders of the defined types were placed in this encounter.   Follow-up: Return in about 3 months (around 04/20/2020) for med F/U, CNM, in person.  [place], [facility] 01/19/2020 3:01 PM

## 2020-01-19 NOTE — Patient Instructions (Signed)
Keep the area clean and dry.  You can remove the big bandage in 24 hours, and the small steri-strip bandage in 3-5 days.  

## 2020-01-20 ENCOUNTER — Ambulatory Visit (HOSPITAL_COMMUNITY)
Admission: RE | Admit: 2020-01-20 | Discharge: 2020-01-20 | Disposition: A | Discharge status: Home / Self Care | Payer: Medicaid Other | Attending: Psychiatry | Admitting: Psychiatry
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
## 2020-01-20 NOTE — H&P (Signed)
Behavioral Health Medical Screening Exam  Julia [name] is a 19 y.o. female patient presented to [facility] with complaints of auditory hallucinations.     Patient seen via tele psych by this provider, consulted with Dr. Dwyane Dee; and chart reviewed on 01/20/20.  On evaluation Julia [name] reports she is having hallucinations of a female voice telling her that she is a dumb bitch. Patient also asking to help her find a stable place to live. Patient states that she has ACT services with [facility]. During evaluation Julia [name] is alert/oriented x 4; calm/cooperative; and mood is congruent with affect.  She does not appear to be responding to internal/external stimuli or delusional thoughts; although she states she is having auditory hallucination non command.  Patient denies suicidal/self-harm/homicidal ideation, and paranoia.  Patient answered question appropriately.     Total Time spent with patient: 30 minutes  Psychiatric Specialty Exam  Presentation  General Appearance:No data recorded Eye Contact:No data recorded Speech:No data recorded Speech Volume:No data recorded Handedness:No data recorded  Mood and Affect  Mood:No data recorded Affect:No data recorded  Thought Process  Thought Processes:No data recorded Descriptions of Associations:No data recorded Orientation:No data recorded Thought Content:No data recorded Hallucinations:No data recorded Ideas of Reference:No data recorded Suicidal Thoughts:No data recorded Homicidal Thoughts:No data recorded  Sensorium  Memory:No data recorded Judgment:No data recorded Insight:No data recorded  Executive Functions  Concentration:No data recorded Attention Span:No data recorded Recall:No data recorded Fund of Knowledge:No data recorded Language:No data recorded  Psychomotor Activity  Psychomotor Activity:No data recorded  Assets  Assets:No data recorded  Sleep  Sleep:No data recorded Number of hours: No data  recorded  Physical Exam: Physical Exam Constitutional:      Appearance: Normal appearance.  Pulmonary:     Effort: Pulmonary effort is normal.  Neurological:     Mental Status: She is alert.  Psychiatric:        Attention and Perception: Attention and perception normal. Visual hallucinations: Patient reporting auditory hallucination; but does not appear to be respoding to internal or external stimuli.        Mood and Affect: Mood and affect normal.        Behavior: Behavior normal. Behavior is cooperative.        Thought Content: Thought content is not paranoid. Thought content does not include homicidal or suicidal ideation.        Cognition and Memory: Cognition and memory normal.        Judgment: Judgment is impulsive.    Review of Systems  Psychiatric/Behavioral: Negative for memory loss. Hallucinations: Patient states that she is hearing a voice telling her that she "is a dumb bitch".  Denies command. Substance abuse:  States that she uses "Xanax, Percocet, and marijuana" Suicidal ideas: Denies. The patient is not nervous/anxious and does not have insomnia.   All other systems reviewed and are negative.  There were no vitals taken for this visit. There is no height or weight on file to calculate BMI.  Musculoskeletal: Strength & Muscle Tone: within normal limits Gait & Station: normal Patient leans: N/A   Recommendations:  Follow up with ACT team.  TTS will call ACT team to follow up with patient tomorrow  Based on my evaluation the patient does not appear to have an emergency medical condition.   Disposition:  Psychiatrically cleared No evidence of imminent risk to self or others at present.   Patient does not meet criteria for psychiatric inpatient admission. Supportive therapy provided  about ongoing stressors. Discussed crisis plan, support from social network, calling 911, coming to the Emergency Department, and calling Suicide Hotline.  Celise Bazar, NP 01/20/2020,  9:15 PM

## 2020-01-21 ENCOUNTER — Ambulatory Visit (HOSPITAL_COMMUNITY)
Admission: EM | Admit: 2020-01-21 | Discharge: 2020-01-21 | Disposition: A | Discharge status: Home / Self Care | Payer: Medicaid Other

## 2020-01-21 ENCOUNTER — Other Ambulatory Visit: Payer: Self-pay | Admitting: Women's Health

## 2020-01-21 LAB — CERVICOVAGINAL ANCILLARY ONLY
Bacterial Vaginitis (gardnerella): POSITIVE — AB
Candida Glabrata: NEGATIVE
Candida Vaginitis: NEGATIVE
Chlamydia: NEGATIVE
Comment: NEGATIVE
Comment: NEGATIVE
Comment: NEGATIVE
Comment: NEGATIVE
Comment: NEGATIVE
Comment: NORMAL
Neisseria Gonorrhea: NEGATIVE
Trichomonas: NEGATIVE

## 2020-01-21 MED ORDER — METRONIDAZOLE 500 MG PO TABS
500.0000 mg | ORAL_TABLET | Freq: Two times a day (BID) | ORAL | 0 refills | Status: DC
Start: 2020-01-21 — End: 2020-03-16

## 2020-01-21 NOTE — Progress Notes (Signed)
Disposition CSW contacted the patient's ACTT providers at [facility]. This Probation officer has worked with the patient previously at the [facility] inpatient unit and remembered the patient's caseworker with [employer] was [name].   CSW called [name] [phone]), however there was no answer. CSW left a HIPPA voicemail requesting a call back.    According to Marvia Pickles, NP the patient has been psychiatrically cleared and has requested resources. CSW will continue to follow for an appropriate disposition.        Radonna Ricker, MSW, LCSW Clinical Social Worker [place]

## 2020-02-02 ENCOUNTER — Encounter (HOSPITAL_COMMUNITY): Payer: Self-pay | Admitting: Emergency Medicine

## 2020-02-02 ENCOUNTER — Emergency Department (HOSPITAL_COMMUNITY)
Admission: EM | Admit: 2020-02-02 | Discharge: 2020-02-02 | Disposition: A | Discharge status: Home / Self Care | Attending: Emergency Medicine | Admitting: Emergency Medicine

## 2020-02-02 DIAGNOSIS — F17210 Nicotine dependence, cigarettes, uncomplicated: Secondary | ICD-10-CM | POA: Insufficient documentation

## 2020-02-02 DIAGNOSIS — B9689 Other specified bacterial agents as the cause of diseases classified elsewhere: Secondary | ICD-10-CM | POA: Insufficient documentation

## 2020-02-02 DIAGNOSIS — L02811 Cutaneous abscess of head [any part, except face]: Secondary | ICD-10-CM | POA: Insufficient documentation

## 2020-02-02 DIAGNOSIS — L0291 Cutaneous abscess, unspecified: Secondary | ICD-10-CM

## 2020-02-02 DIAGNOSIS — R220 Localized swelling, mass and lump, head: Secondary | ICD-10-CM | POA: Diagnosis present

## 2020-02-02 MED ORDER — ACETAMINOPHEN 500 MG PO TABS
1000.0000 mg | ORAL_TABLET | Freq: Once | ORAL | Status: AC
  Administered 2020-02-02: 1000 mg via ORAL
  Filled 2020-02-02: qty 2

## 2020-02-02 MED ORDER — LIDOCAINE-EPINEPHRINE (PF) 2 %-1:200000 IJ SOLN
10.0000 mL | Freq: Once | INTRAMUSCULAR | Status: AC
  Administered 2020-02-02: 10 mL via INTRADERMAL
  Filled 2020-02-02: qty 20

## 2020-02-02 MED ORDER — CEPHALEXIN 500 MG PO CAPS
500.0000 mg | ORAL_CAPSULE | Freq: Four times a day (QID) | ORAL | 0 refills | Status: AC
Start: 2020-02-02 — End: 2020-02-09

## 2020-02-02 MED ORDER — CEPHALEXIN 500 MG PO CAPS
500.0000 mg | ORAL_CAPSULE | Freq: Once | ORAL | Status: AC
  Administered 2020-02-02: 500 mg via ORAL
  Filled 2020-02-02: qty 1

## 2020-02-02 NOTE — Discharge Instructions (Signed)
Apply warm compresses to the area or soak the area in warm water to help continue express drainage.   Keep the wound clean and dry. Gently wash the wound with soap and water and make sure to pat it dry.   Take Keflex in addition to the Bactrim.  You can take Tylenol or Ibuprofen as directed for pain.  Return to the Emergency Department if you experienced any worsening/spreading redness or swelling, fever, worsening pain, or any other worsening or concerning symptoms.
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
## 2020-02-02 NOTE — ED Triage Notes (Signed)
Per EMS-states she is being treated for an abscess on her forehead-treated with Bactrim-has had 2 doses-last dose was 4 am-eyes swollen, almost shut-50 mg of Benadryl given by jail IM at 1010
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
## 2020-02-11 ENCOUNTER — Telehealth (HOSPITAL_COMMUNITY): Payer: Medicaid Other

## 2020-02-11 ENCOUNTER — Telehealth (HOSPITAL_COMMUNITY): Payer: Medicaid Other | Admitting: Psychiatric/Mental Health

## 2020-02-11 ENCOUNTER — Other Ambulatory Visit: Payer: Self-pay

## 2020-03-16 ENCOUNTER — Ambulatory Visit (HOSPITAL_COMMUNITY)
Admission: EM | Admit: 2020-03-16 | Discharge: 2020-03-16 | Disposition: A | Discharge status: Other Acute Inpatient Hospital | Payer: Medicaid Other | Attending: Family | Admitting: Family

## 2020-03-16 ENCOUNTER — Inpatient Hospital Stay (HOSPITAL_COMMUNITY)
Admission: AD | Admit: 2020-03-16 | Discharge: 2020-03-22 | DRG: 885 | Disposition: A | Discharge status: Home / Self Care | Payer: Medicaid Other | Attending: Psychiatry | Admitting: Psychiatry

## 2020-03-16 ENCOUNTER — Other Ambulatory Visit: Payer: Self-pay

## 2020-03-16 ENCOUNTER — Encounter (HOSPITAL_COMMUNITY): Payer: Self-pay | Admitting: Psychiatry

## 2020-03-16 DIAGNOSIS — R4182 Altered mental status, unspecified: Secondary | ICD-10-CM | POA: Diagnosis present

## 2020-03-16 DIAGNOSIS — F329 Major depressive disorder, single episode, unspecified: Secondary | ICD-10-CM | POA: Diagnosis present

## 2020-03-16 DIAGNOSIS — R4589 Other symptoms and signs involving emotional state: Secondary | ICD-10-CM | POA: Diagnosis not present

## 2020-03-16 DIAGNOSIS — F17210 Nicotine dependence, cigarettes, uncomplicated: Secondary | ICD-10-CM | POA: Diagnosis present

## 2020-03-16 DIAGNOSIS — Z20822 Contact with and (suspected) exposure to covid-19: Secondary | ICD-10-CM | POA: Insufficient documentation

## 2020-03-16 DIAGNOSIS — K219 Gastro-esophageal reflux disease without esophagitis: Secondary | ICD-10-CM | POA: Diagnosis present

## 2020-03-16 DIAGNOSIS — G4700 Insomnia, unspecified: Secondary | ICD-10-CM | POA: Diagnosis not present

## 2020-03-16 DIAGNOSIS — F200 Paranoid schizophrenia: Secondary | ICD-10-CM

## 2020-03-16 DIAGNOSIS — Z818 Family history of other mental and behavioral disorders: Secondary | ICD-10-CM | POA: Diagnosis not present

## 2020-03-16 DIAGNOSIS — F1910 Other psychoactive substance abuse, uncomplicated: Secondary | ICD-10-CM | POA: Diagnosis not present

## 2020-03-16 DIAGNOSIS — F259 Schizoaffective disorder, unspecified: Secondary | ICD-10-CM | POA: Diagnosis not present

## 2020-03-16 DIAGNOSIS — F419 Anxiety disorder, unspecified: Secondary | ICD-10-CM | POA: Diagnosis present

## 2020-03-16 DIAGNOSIS — F22 Delusional disorders: Secondary | ICD-10-CM | POA: Insufficient documentation

## 2020-03-16 DIAGNOSIS — Z79899 Other long term (current) drug therapy: Secondary | ICD-10-CM | POA: Diagnosis not present

## 2020-03-16 DIAGNOSIS — F209 Schizophrenia, unspecified: Secondary | ICD-10-CM | POA: Diagnosis present

## 2020-03-16 LAB — POC SARS CORONAVIRUS 2 AG -  ED

## 2020-03-16 LAB — SARS CORONAVIRUS 2 BY RT PCR (HOSPITAL ORDER, PERFORMED IN ~~LOC~~ HOSPITAL LAB): SARS Coronavirus 2: NEGATIVE

## 2020-03-16 LAB — POC SARS CORONAVIRUS 2 AG: SARS Coronavirus 2 Ag: NEGATIVE

## 2020-03-16 MED ORDER — OLANZAPINE 10 MG PO TBDP: 10.0000 mg | ORAL_TABLET | Freq: Three times a day (TID) | ORAL | Status: DC | PRN

## 2020-03-16 MED ORDER — TRAZODONE HCL 50 MG PO TABS
50.0000 mg | ORAL_TABLET | Freq: Every evening | ORAL | Status: DC | PRN
  Administered 2020-03-16: 50 mg via ORAL
  Filled 2020-03-16: qty 1

## 2020-03-16 MED ORDER — LORAZEPAM 1 MG PO TABS: 1.0000 mg | ORAL_TABLET | ORAL | Status: DC | PRN

## 2020-03-16 MED ORDER — LORAZEPAM 2 MG/ML IJ SOLN
INTRAMUSCULAR | Status: AC
  Administered 2020-03-16: 2 mg via INTRAMUSCULAR
  Filled 2020-03-16: qty 1

## 2020-03-16 MED ORDER — LORAZEPAM 1 MG PO TABS
2.0000 mg | ORAL_TABLET | Freq: Four times a day (QID) | ORAL | Status: DC | PRN
  Administered 2020-03-17 – 2020-03-18 (×2): 2 mg via ORAL
  Filled 2020-03-16 (×2): qty 2

## 2020-03-16 MED ORDER — BENZTROPINE MESYLATE 1 MG PO TABS: 1.0000 mg | ORAL_TABLET | Freq: Two times a day (BID) | ORAL | Status: DC | PRN

## 2020-03-16 MED ORDER — LORAZEPAM 2 MG/ML IJ SOLN: 2.0000 mg | Freq: Four times a day (QID) | INTRAMUSCULAR | Status: DC | PRN

## 2020-03-16 MED ORDER — MAGNESIUM HYDROXIDE 400 MG/5ML PO SUSP: 30.0000 mL | Freq: Every day | ORAL | Status: DC | PRN

## 2020-03-16 MED ORDER — LORAZEPAM 2 MG/ML IJ SOLN: 2.0000 mg | INTRAMUSCULAR | Status: AC

## 2020-03-16 MED ORDER — DIPHENHYDRAMINE HCL 50 MG/ML IJ SOLN: 25.0000 mg | INTRAMUSCULAR | Status: AC

## 2020-03-16 MED ORDER — PALIPERIDONE PALMITATE ER 234 MG/1.5ML IM SUSY
234.0000 mg | PREFILLED_SYRINGE | Freq: Once | INTRAMUSCULAR | Status: AC
  Administered 2020-03-17: 234 mg via INTRAMUSCULAR
  Filled 2020-03-16: qty 1.5

## 2020-03-16 MED ORDER — ACETAMINOPHEN 325 MG PO TABS
650.0000 mg | ORAL_TABLET | Freq: Four times a day (QID) | ORAL | Status: DC | PRN
  Administered 2020-03-18 – 2020-03-20 (×2): 650 mg via ORAL
  Filled 2020-03-16 (×2): qty 2

## 2020-03-16 MED ORDER — HYDROXYZINE HCL 50 MG PO TABS
50.0000 mg | ORAL_TABLET | Freq: Three times a day (TID) | ORAL | Status: DC | PRN
  Administered 2020-03-16 – 2020-03-21 (×7): 50 mg via ORAL
  Filled 2020-03-16 (×7): qty 1

## 2020-03-16 MED ORDER — ALUM & MAG HYDROXIDE-SIMETH 200-200-20 MG/5ML PO SUSP: 30.0000 mL | ORAL | Status: DC | PRN

## 2020-03-16 MED ORDER — ZIPRASIDONE MESYLATE 20 MG IM SOLR: 20.0000 mg | INTRAMUSCULAR | Status: AC

## 2020-03-16 MED ORDER — FLUOXETINE HCL 20 MG PO CAPS
20.0000 mg | ORAL_CAPSULE | Freq: Every day | ORAL | Status: DC
  Administered 2020-03-16 – 2020-03-20 (×5): 20 mg via ORAL
  Filled 2020-03-16 (×6): qty 1

## 2020-03-16 MED ORDER — DIPHENHYDRAMINE HCL 50 MG/ML IJ SOLN
INTRAMUSCULAR | Status: AC
  Administered 2020-03-16: 25 mg via INTRAMUSCULAR
  Filled 2020-03-16: qty 1

## 2020-03-16 MED ORDER — ZIPRASIDONE MESYLATE 20 MG IM SOLR
20.0000 mg | INTRAMUSCULAR | Status: AC | PRN
  Administered 2020-03-17: 20 mg via INTRAMUSCULAR
  Filled 2020-03-16: qty 20

## 2020-03-16 MED ORDER — NICOTINE 21 MG/24HR TD PT24
21.0000 mg | MEDICATED_PATCH | Freq: Every day | TRANSDERMAL | Status: DC
  Filled 2020-03-16 (×3): qty 1

## 2020-03-16 MED ORDER — PALIPERIDONE ER 6 MG PO TB24
6.0000 mg | ORAL_TABLET | ORAL | Status: AC
  Administered 2020-03-16: 6 mg via ORAL
  Filled 2020-03-16: qty 1

## 2020-03-16 MED ORDER — ZIPRASIDONE MESYLATE 20 MG IM SOLR
INTRAMUSCULAR | Status: AC
  Administered 2020-03-16: 20 mg via INTRAMUSCULAR
  Filled 2020-03-16: qty 20
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
## 2020-03-16 NOTE — Tx Team (Signed)
Initial Treatment Plan 03/16/2020 3:13 PM [name] BOM:[id number]    PATIENT STRESSORS: Health problems Medication change or noncompliance Traumatic event   PATIENT STRENGTHS: General fund of knowledge Physical Health Supportive family/friends   PATIENT IDENTIFIED PROBLEMS: paranoid  Responding to stimuli  anxiety  delusions               DISCHARGE CRITERIA:  Ability to meet basic life and health needs Improved stabilization in mood, thinking, and/or behavior Medical problems require only outpatient monitoring Motivation to continue treatment in a less acute level of care  PRELIMINARY DISCHARGE PLAN: Attend aftercare/continuing care group Outpatient therapy Return to previous living arrangement  PATIENT/FAMILY INVOLVEMENT: This treatment plan has been presented to and reviewed with the patient, [name].  The patient and family have been given the opportunity to ask questions and make suggestions.  Baron Sane, RN 03/16/2020, 3:13 PM
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
## 2020-03-16 NOTE — H&P (Signed)
Psychiatric Admission Assessment Adult  Patient Identification: Julia [name] MRN:  [id number] Date of Evaluation:  03/16/2020 Chief Complaint:  Schizophrenia ([place]) [F20.9] Principal Diagnosis: <principal problem not specified> Diagnosis:  Active Problems:   Schizophrenia ([place])  History of Present Illness: Patient is seen and examined.  [name] familiar to me from multiple hospitalizations both at [facility] behavioral health as well as [facility] who presented to the [facility] on 03/16/2020 secondary to altered mental status.  The patient had been brought to the behavioral health center by [facility] police.  At the time of the evaluation she was disorganized and responding to internal stimuli.  She stated that she was there to get "checked out".  She was last hospitalized at our facility on 10/15/2019.  She was discharged on Cogentin, fluoxetine, Invega 12 mg p.o. nightly and trazodone.  She apparently had a video visit on 12/17/2019.  The medication list at that time was the same as a discharge.  At the time of admission she is screaming, yelling, agitated.  She has a longstanding history of psychosis thought to be schizophrenia.  She has a long history of polysubstance issues including methamphetamines.  During the course of her previous hospitalization she contacts or attempts to contact a boyfriend of such and tries to get involved with him.  Her comments today was that she was "sort of living" with her boyfriend.  She has been followed by an [employer] [employer]  Their service stated that she had not been stable since being with them.  She has been followed by Jeannene Patella since February 2021.  She was transferred from the [facility] facility for admission.  Associated Signs/Symptoms: Depression Symptoms:  insomnia, psychomotor agitation, anxiety, disturbed sleep, (Hypo) Manic Symptoms:  Delusions, Impulsivity, Irritable Mood, Labiality  of Mood, Anxiety Symptoms:  Excessive Worry, Psychotic Symptoms:  Delusions, Paranoia, PTSD Symptoms: Negative Total Time spent with patient: 45 minutes  Past Psychiatric History: Patient has had multiple psychiatric hospitalizations within our system.  Within the last 12 months she has been admitted 4 times previously.  She has been admitted at [facility] as well as the behavioral health hospital.  She has a history of methamphetamine dependence.  She is a diagnosis of schizophrenia with possible onset secondary to her methamphetamine dependence.  She is also been previously diagnosed with schizoaffective disorder.  She has remote history reportedly of attention deficit disorder.  Her substance abuse issues began approximately at age 31 or 44.  Is the patient at risk to self? Yes.    Has the patient been a risk to self in the past 6 months? Yes.    Has the patient been a risk to self within the distant past? Yes.    Is the patient a risk to others? No.  Has the patient been a risk to others in the past 6 months? No.  Has the patient been a risk to others within the distant past? No.   Prior Inpatient Therapy:   Prior Outpatient Therapy:    Alcohol Screening:   Substance Abuse History in the last 12 months:  Yes.   Consequences of Substance Abuse: Medical Consequences:  Clearly associated with her psychosis and as well worsening of her psychosis. Previous Psychotropic Medications: Yes  Psychological Evaluations: Yes  Past Medical History:  Past Medical History:  Diagnosis Date  . Burning with urination 05/03/2015  . Contraceptive management 05/03/2015  . Depression   .  Dysmenorrhea 12/30/2013  . Heroin addiction ([place])   . Menorrhagia 12/30/2013  . Menstrual extraction 12/30/2013  . Migraines   . Social anxiety disorder 09/25/2016  . Suicidal ideations   . Vaginal odor 05/03/2015    Past Surgical History:  Procedure Laterality Date  . NO PAST SURGERIES      Family History:  Family History  Problem Relation Age of Onset  . Depression Mother   . Hypertension Father   . Hyperlipidemia Father   . Cancer Paternal Grandmother        breast, uterine  . Cirrhosis Paternal Grandfather        due to alcohol   Family Psychiatric  History: Reportedly depression in several members of her family as well as schizophrenia in an aunt. Tobacco Screening:   Social History:  Social History   Substance and Sexual Activity  Alcohol Use Yes   Comment: BAC was clear     Social History   Substance and Sexual Activity  Drug Use Yes  . Types: Marijuana, Oxycodone   Comment: reports oxycodone and marijuana    Additional Social History:                           Allergies:   Allergies  Allergen Reactions  . Abilify [Aripiprazole]   . Zoloft [Sertraline Hcl]    Lab Results:  Results for orders placed or performed during the hospital encounter of 03/16/20 (from the past 48 hour(s))  POC SARS Coronavirus 2 Ag-ED - Nasal Swab (BD Veritor Kit)     Status: Normal   Collection Time: 03/16/20 11:36 AM  Result Value Ref Range   SARS Coronavirus 2 Ag    SARS Coronavirus 2 by RT PCR (hospital order, performed in [place] hospital lab) Nasopharyngeal Nasopharyngeal Swab     Status: None   Collection Time: 03/16/20 11:37 AM   Specimen: Nasopharyngeal Swab  Result Value Ref Range   SARS Coronavirus 2 NEGATIVE NEGATIVE    Comment: (NOTE) SARS-CoV-2 target nucleic acids are NOT DETECTED.  The SARS-CoV-2 RNA is generally detectable in upper and lower respiratory specimens during the acute phase of infection. The lowest concentration of SARS-CoV-2 viral copies this assay can detect is 250 copies / mL. A negative result does not preclude SARS-CoV-2 infection and should not be used as the sole basis for treatment or other patient management decisions.  A negative result may occur with improper specimen collection / handling, submission of  specimen other than nasopharyngeal swab, presence of viral mutation(s) within the areas targeted by this assay, and inadequate number of viral copies (<250 copies / mL). A negative result must be combined with clinical observations, patient history, and epidemiological information.  Fact Sheet for Patients:   StrictlyIdeas.no  Fact Sheet for Healthcare Providers: BankingDealers.co.za  This test is not yet approved or  cleared by the [place] FDA and has been authorized for detection and/or diagnosis of SARS-CoV-2 by FDA under an Emergency Use Authorization (EUA).  This EUA will remain in effect (meaning this test can be used) for the duration of the COVID-19 declaration under Section 564(b)(1) of the Act, 21 U.S.C. section 360bbb-3(b)(1), unless the authorization is terminated or revoked sooner.  Performed at [facility], [place] [address] [place], [place] [address]   POC SARS Coronavirus 2 Ag     Status: None   Collection Time: 03/16/20 11:47 AM  Result Value Ref Range   SARS Coronavirus 2  Ag NEGATIVE NEGATIVE    Comment: (NOTE) SARS-CoV-2 antigen NOT DETECTED.   Negative results are presumptive.  Negative results do not preclude SARS-CoV-2 infection and should not be used as the sole basis for treatment or other patient management decisions, including infection  control decisions, particularly in the presence of clinical signs and  symptoms consistent with COVID-19, or in those who have been in contact with the virus.  Negative results must be combined with clinical observations, patient history, and epidemiological information. The expected result is Negative.  Fact Sheet for Patients: https://sanders-williams.net/  Fact Sheet for Healthcare Providers: https://martinez.com/   This test is not yet approved or cleared by the [place] FDA and  has been authorized for detection  and/or diagnosis of SARS-CoV-2 by FDA under an Emergency Use Authorization (EUA).  This EUA will remain in effect (meaning this test can be used) for the duration of  the C OVID-19 declaration under Section 564(b)(1) of the Act, 21 U.S.C. section 360bbb-3(b)(1), unless the authorization is terminated or revoked sooner.      Blood Alcohol level:  Lab Results  Component Value Date   ETH <10 10/16/2019   ETH <10 07/09/2019    Metabolic Disorder Labs:  Lab Results  Component Value Date   HGBA1C 5.4 10/16/2019   MPG 108.28 10/16/2019   MPG 102.54 05/17/2019   Lab Results  Component Value Date   PROLACTIN 113.0 (H) 11/11/2019   PROLACTIN 152.0 (H) 10/16/2019   Lab Results  Component Value Date   CHOL 138 10/16/2019   TRIG 60 10/16/2019   HDL 36 (L) 10/16/2019   CHOLHDL 3.8 10/16/2019   VLDL 12 10/16/2019   LDLCALC 90 10/16/2019   LDLCALC 96 05/17/2019    Current Medications: Current Facility-Administered Medications  Medication Dose Route Frequency Provider Last Rate Last Admin  . acetaminophen (TYLENOL) tablet 650 mg  650 mg Oral Q6H PRN Antonieta Pert, MD      . alum & mag hydroxide-simeth (MAALOX/MYLANTA) 200-200-20 MG/5ML suspension 30 mL  30 mL Oral Q4H PRN Antonieta Pert, MD      . benztropine (COGENTIN) tablet 1 mg  1 mg Oral BID PRN Antonieta Pert, MD      . FLUoxetine (PROZAC) capsule 20 mg  20 mg Oral Daily Antonieta Pert, MD      . hydrOXYzine (ATARAX/VISTARIL) tablet 50 mg  50 mg Oral TID PRN Antonieta Pert, MD      . LORazepam (ATIVAN) tablet 2 mg  2 mg Oral Q6H PRN Antonieta Pert, MD       Or  . LORazepam (ATIVAN) injection 2 mg  2 mg Intramuscular Q6H PRN Antonieta Pert, MD      . OLANZapine zydis (ZYPREXA) disintegrating tablet 10 mg  10 mg Oral Q8H PRN Antonieta Pert, MD       And  . LORazepam (ATIVAN) tablet 1 mg  1 mg Oral PRN Antonieta Pert, MD       And  . ziprasidone (GEODON) injection 20 mg  20 mg  Intramuscular PRN Antonieta Pert, MD      . magnesium hydroxide (MILK OF MAGNESIA) suspension 30 mL  30 mL Oral Daily PRN Antonieta Pert, MD      . nicotine (NICODERM CQ - dosed in mg/24 hours) patch 21 mg  21 mg Transdermal Daily Antonieta Pert, MD      . paliperidone [facility] SUSTENNA) injection 234 mg  234 mg Intramuscular Once  Sharma Covert, MD      . paliperidone [facility]) 24 hr tablet 6 mg  6 mg Oral NOW Mallie Darting Cordie Grice, MD      . traZODone (DESYREL) tablet 50 mg  50 mg Oral QHS PRN Sharma Covert, MD       PTA Medications: Medications Prior to Admission  Medication Sig Dispense Refill Last Dose  . busPIRone (BUSPAR) 5 MG tablet Take 5 mg by mouth 3 (three) times daily.     [name] FLUoxetine (PROZAC) 20 MG capsule Take 20 mg by mouth daily.     . paliperidone (INVEGA) 3 MG 24 hr tablet Take 3 mg by mouth daily.       Musculoskeletal: Strength & Muscle Tone: within normal limits Gait & Station: normal Patient leans: N/A  Psychiatric Specialty Exam: Physical Exam Vitals and nursing note reviewed.  HENT:     Head: Normocephalic and atraumatic.  Pulmonary:     Effort: Pulmonary effort is normal.  Neurological:     General: No focal deficit present.     Mental Status: She is alert.     Review of Systems  Blood pressure (!) 140/93, pulse 88, temperature 98.1 F (36.7 C), temperature source Oral, resp. rate 20, height $RemoveBe'5\' 3"'HxrRdEwYh$  (1.6 m), weight 73.5 kg, SpO2 98 %.Body mass index is 28.7 kg/m.  General Appearance: Disheveled  Eye Contact:  Good  Speech:  Pressured  Volume:  Increased  Mood:  Angry and Dysphoric  Affect:  Labile  Thought Process:  Goal Directed and Descriptions of Associations: Loose  Orientation:  Negative  Thought Content:  Delusions and Paranoid Ideation  Suicidal Thoughts:  No  Homicidal Thoughts:  No  Memory:  Immediate;   Poor Recent;   Poor Remote;   Poor  Judgement:  Impaired  Insight:  Lacking  Psychomotor Activity:  Increased   Concentration:  Concentration: Poor and Attention Span: Poor  Recall:  Poor  Fund of Knowledge:  Poor  Language:  Good  Akathisia:  Negative  Handed:  Right  AIMS (if indicated):     Assets:  Desire for Improvement Resilience  ADL's:  Intact  Cognition:  WNL  Sleep:       Treatment Plan Summary: Daily contact with patient to assess and evaluate symptoms and progress in treatment, Medication management and Plan : Patient is seen and examined.  [name] familiar to me from multiple psychiatric hospitalizations before he was brought to our facility secondary to psychosis, probable noncompliance of medications, suspected relapse on substances including previous use of amphetamines and marijuana.  She will be admitted to the hospital.  She will be integrated in the milieu.  Which she will be encouraged to attend groups.  Unfortunately we have no laboratory work on her, and so we will go on and order a CBC with differential, complete metabolic panel, urinalysis, urine drug screen and beta-hCG immediately.  She apparently was taking oral paliperidone, but given her history I think injectable paliperidone would be best at least at this point.  Pharmacy has tried to assess for his prescriptions and it appears the only medication she is on is the oral Invega.  We will give her the 256 mg IM today x1.  We will go on and write for 6 mg of Invega nightly.  Because of her significant agitation at this point we are forced to give her Geodon 20 mg IM x1, Ativan 2 mg IM x1, Benadryl 25 mg IM x1.  I will also restart her fluoxetine at 20 mg a day for now.  Once we confirm her dosage with the [facility] team I will change the dosage.  I will have pharmacy contact them for full medical regimen.  Her vital signs admission showed she was afebrile.  Pulse was 88.  Her blood pressure was mildly elevated at 140/93 (most likely secondary to agitation).  Her pulse oximetry on room air was  98%.  Observation Level/Precautions:  15 minute checks  Laboratory:  Chemistry Profile  Psychotherapy:    Medications:    Consultations:    Discharge Concerns:    Estimated LOS:  Other:     Physician Treatment Plan for Primary Diagnosis: <principal problem not specified> Long Term Goal(s): Improvement in symptoms so as ready for discharge  Short Term Goals: Ability to identify changes in lifestyle to reduce recurrence of condition will improve, Ability to verbalize feelings will improve, Ability to demonstrate self-control will improve, Ability to identify and develop effective coping behaviors will improve, Ability to maintain clinical measurements within normal limits will improve, Compliance with prescribed medications will improve and Ability to identify triggers associated with substance abuse/mental health issues will improve  Physician Treatment Plan for Secondary Diagnosis: Active Problems:   Schizophrenia ([place])  Long Term Goal(s): Improvement in symptoms so as ready for discharge  Short Term Goals: Ability to identify changes in lifestyle to reduce recurrence of condition will improve, Ability to verbalize feelings will improve, Ability to demonstrate self-control will improve, Ability to identify and develop effective coping behaviors will improve, Ability to maintain clinical measurements within normal limits will improve, Compliance with prescribed medications will improve and Ability to identify triggers associated with substance abuse/mental health issues will improve  I certify that inpatient services furnished can reasonably be expected to improve the patient's condition.    Sharma Covert, MD 9/2/20213:38 PM

## 2020-03-17 DIAGNOSIS — F1910 Other psychoactive substance abuse, uncomplicated: Secondary | ICD-10-CM

## 2020-03-17 DIAGNOSIS — R4589 Other symptoms and signs involving emotional state: Secondary | ICD-10-CM | POA: Diagnosis not present

## 2020-03-17 DIAGNOSIS — F200 Paranoid schizophrenia: Secondary | ICD-10-CM | POA: Diagnosis not present

## 2020-03-17 LAB — CBC WITH DIFFERENTIAL/PLATELET
Abs Immature Granulocytes: 0.06 K/uL (ref 0.00–0.07)
Basophils Absolute: 0 K/uL (ref 0.0–0.1)
Basophils Relative: 1 %
Eosinophils Absolute: 0.1 K/uL (ref 0.0–0.5)
Eosinophils Relative: 1 %
HCT: 41.9 % (ref 36.0–46.0)
Hemoglobin: 13.3 g/dL (ref 12.0–15.0)
Immature Granulocytes: 1 %
Lymphocytes Relative: 35 %
Lymphs Abs: 2.9 K/uL (ref 0.7–4.0)
MCH: 28.9 pg (ref 26.0–34.0)
MCHC: 31.7 g/dL (ref 30.0–36.0)
MCV: 90.9 fL (ref 80.0–100.0)
Monocytes Absolute: 0.7 K/uL (ref 0.1–1.0)
Monocytes Relative: 9 %
Neutro Abs: 4.4 K/uL (ref 1.7–7.7)
Neutrophils Relative %: 53 %
Platelets: 267 K/uL (ref 150–400)
RBC: 4.61 MIL/uL (ref 3.87–5.11)
RDW: 15.5 % (ref 11.5–15.5)
WBC: 8.2 K/uL (ref 4.0–10.5)
nRBC: 0 % (ref 0.0–0.2)

## 2020-03-17 LAB — COMPREHENSIVE METABOLIC PANEL WITH GFR
ALT: 16 U/L (ref 0–44)
AST: 18 U/L (ref 15–41)
Albumin: 4 g/dL (ref 3.5–5.0)
Alkaline Phosphatase: 83 U/L (ref 38–126)
Anion gap: 10 (ref 5–15)
BUN: 15 mg/dL (ref 6–20)
CO2: 24 mmol/L (ref 22–32)
Calcium: 9.1 mg/dL (ref 8.9–10.3)
Chloride: 104 mmol/L (ref 98–111)
Creatinine, Ser: 0.84 mg/dL (ref 0.44–1.00)
GFR calc Af Amer: >60 mL/min
GFR calc non Af Amer: >60 mL/min
Glucose, Bld: 96 mg/dL (ref 70–99)
Potassium: 3.8 mmol/L (ref 3.5–5.1)
Sodium: 138 mmol/L (ref 135–145)
Total Bilirubin: 0.1 mg/dL — ABNORMAL LOW (ref 0.3–1.2)
Total Protein: 7.3 g/dL (ref 6.5–8.1)

## 2020-03-17 LAB — HEMOGLOBIN A1C
Hgb A1c MFr Bld: 5.5 % (ref 4.8–5.6)
Mean Plasma Glucose: 111.15 mg/dL

## 2020-03-17 LAB — HCG, SERUM, QUALITATIVE: Preg, Serum: NEGATIVE

## 2020-03-17 MED ORDER — NICOTINE POLACRILEX 2 MG MT GUM
CHEWING_GUM | OROMUCOSAL | Status: AC
  Filled 2020-03-17: qty 1

## 2020-03-17 MED ORDER — PALIPERIDONE ER 6 MG PO TB24
9.0000 mg | ORAL_TABLET | Freq: Every day | ORAL | Status: DC
  Administered 2020-03-17: 9 mg via ORAL
  Filled 2020-03-17 (×3): qty 1

## 2020-03-17 MED ORDER — OLANZAPINE 10 MG IM SOLR
5.0000 mg | Freq: Once | INTRAMUSCULAR | Status: AC
  Administered 2020-03-17: 5 mg via INTRAMUSCULAR

## 2020-03-17 MED ORDER — TRAZODONE HCL 100 MG PO TABS
100.0000 mg | ORAL_TABLET | Freq: Every evening | ORAL | Status: DC | PRN
  Administered 2020-03-17 – 2020-03-21 (×5): 100 mg via ORAL
  Filled 2020-03-17 (×5): qty 1

## 2020-03-17 MED ORDER — NICOTINE POLACRILEX 2 MG MT GUM
2.0000 mg | CHEWING_GUM | OROMUCOSAL | Status: DC | PRN
  Administered 2020-03-17 – 2020-03-22 (×10): 2 mg via ORAL
  Filled 2020-03-17 (×7): qty 1

## 2020-03-17 MED ORDER — BENZTROPINE MESYLATE 1 MG/ML IJ SOLN: 2.0000 mg | Freq: Two times a day (BID) | INTRAMUSCULAR | Status: DC | PRN

## 2020-03-17 MED ORDER — OLANZAPINE 10 MG IM SOLR: 5.0000 mg | Freq: Once | INTRAMUSCULAR | Status: DC | PRN

## 2020-03-17 MED ORDER — BENZTROPINE MESYLATE 1 MG PO TABS: 2.0000 mg | ORAL_TABLET | Freq: Two times a day (BID) | ORAL | Status: DC | PRN

## 2020-03-17 MED ORDER — OLANZAPINE 10 MG IM SOLR
INTRAMUSCULAR | Status: AC
  Filled 2020-03-17: qty 10

## 2020-03-17 NOTE — Progress Notes (Signed)
Pt observed throwing up on the hallway after eating her breakfast. Pt stated she started feeling hot nauseated. Pt help in the room, went and took a shower, pt reports feeling better afterwards.

## 2020-03-17 NOTE — Progress Notes (Signed)
SPIRITUALITY GROUP NOTE  Pt attended spirituality group facilitated by Simone Curia, MDIv, [place].  Group Description:  Group focused on topic of hope.  Patients participated in facilitated discussion around topic, connecting with one another around experiences and definitions for hope.  Group members engaged with visual explorer photos, reflecting on what hope looks like for them today.  Group engaged in discussion around how their definitions of hope are present today in hospital.   Modalities: Psycho-social ed, Adlerian, Narrative, MI Patient Progress:  [place] was in and out of group room.  Did not engage in group.

## 2020-03-17 NOTE — Tx Team (Signed)
Interdisciplinary Treatment and Diagnostic Plan Update  03/17/2020 Time of Session: 10:30am Julia [name] MRN: [id number]  Principal Diagnosis: <principal problem not specified>  Secondary Diagnoses: Active Problems:   Schizophrenia ([place])   Current Medications:  Current Facility-Administered Medications  Medication Dose Route Frequency Provider Last Rate Last Admin  . acetaminophen (TYLENOL) tablet 650 mg  650 mg Oral Q6H PRN Sharma Covert, MD      . alum & mag hydroxide-simeth (MAALOX/MYLANTA) 200-200-20 MG/5ML suspension 30 mL  30 mL Oral Q4H PRN Sharma Covert, MD      . benztropine (COGENTIN) tablet 1 mg  1 mg Oral BID PRN Sharma Covert, MD      . FLUoxetine (PROZAC) capsule 20 mg  20 mg Oral Daily Sharma Covert, MD   20 mg at 03/17/20 0800  . hydrOXYzine (ATARAX/VISTARIL) tablet 50 mg  50 mg Oral TID PRN Sharma Covert, MD   50 mg at 03/16/20 2033  . LORazepam (ATIVAN) tablet 2 mg  2 mg Oral Q6H PRN Sharma Covert, MD   2 mg at 03/17/20 0800   Or  . LORazepam (ATIVAN) injection 2 mg  2 mg Intramuscular Q6H PRN Sharma Covert, MD      . OLANZapine zydis (ZYPREXA) disintegrating tablet 10 mg  10 mg Oral Q8H PRN Sharma Covert, MD       And  . LORazepam (ATIVAN) tablet 1 mg  1 mg Oral PRN Sharma Covert, MD       And  . ziprasidone (GEODON) injection 20 mg  20 mg Intramuscular PRN Sharma Covert, MD      . magnesium hydroxide (MILK OF MAGNESIA) suspension 30 mL  30 mL Oral Daily PRN Sharma Covert, MD      . nicotine polacrilex (NICORETTE) 2 MG gum           . nicotine polacrilex (NICORETTE) gum 2 mg  2 mg Oral PRN Connye Burkitt, NP   2 mg at 03/17/20 0956  . OLANZapine (ZYPREXA) injection 5 mg  5 mg Intramuscular Once [name], MD      . traZODone (DESYREL) tablet 50 mg  50 mg Oral QHS PRN Sharma Covert, MD   50 mg at 03/16/20 2033   PTA Medications: Medications Prior to Admission  Medication Sig Dispense Refill Last  Dose  . busPIRone (BUSPAR) 5 MG tablet Take 5 mg by mouth 3 (three) times daily.     [name] FLUoxetine (PROZAC) 20 MG capsule Take 20 mg by mouth daily.     . paliperidone (INVEGA) 3 MG 24 hr tablet Take 3 mg by mouth daily.       Patient Stressors: Health problems Medication change or noncompliance Traumatic event  Patient Strengths: General fund of knowledge Physical Health Supportive family/friends  Treatment Modalities: Medication Management, Group therapy, Case management,  1 to 1 session with clinician, Psychoeducation, Recreational therapy.   Physician Treatment Plan for Primary Diagnosis: <principal problem not specified> Long Term Goal(s): Improvement in symptoms so as ready for discharge Improvement in symptoms so as ready for discharge   Short Term Goals: Ability to identify changes in lifestyle to reduce recurrence of condition will improve Ability to verbalize feelings will improve Ability to demonstrate self-control will improve Ability to identify and develop effective coping behaviors will improve Ability to maintain clinical measurements within normal limits will improve Compliance with prescribed medications will improve Ability to identify triggers associated with substance abuse/mental health  issues will improve Ability to identify changes in lifestyle to reduce recurrence of condition will improve Ability to verbalize feelings will improve Ability to demonstrate self-control will improve Ability to identify and develop effective coping behaviors will improve Ability to maintain clinical measurements within normal limits will improve Compliance with prescribed medications will improve Ability to identify triggers associated with substance abuse/mental health issues will improve  Medication Management: Evaluate patient's response, side effects, and tolerance of medication regimen.  Therapeutic Interventions: 1 to 1 sessions, Unit Group sessions and Medication  administration.  Evaluation of Outcomes: Not Met  Physician Treatment Plan for Secondary Diagnosis: Active Problems:   Schizophrenia ([place])  Long Term Goal(s): Improvement in symptoms so as ready for discharge Improvement in symptoms so as ready for discharge   Short Term Goals: Ability to identify changes in lifestyle to reduce recurrence of condition will improve Ability to verbalize feelings will improve Ability to demonstrate self-control will improve Ability to identify and develop effective coping behaviors will improve Ability to maintain clinical measurements within normal limits will improve Compliance with prescribed medications will improve Ability to identify triggers associated with substance abuse/mental health issues will improve Ability to identify changes in lifestyle to reduce recurrence of condition will improve Ability to verbalize feelings will improve Ability to demonstrate self-control will improve Ability to identify and develop effective coping behaviors will improve Ability to maintain clinical measurements within normal limits will improve Compliance with prescribed medications will improve Ability to identify triggers associated with substance abuse/mental health issues will improve     Medication Management: Evaluate patient's response, side effects, and tolerance of medication regimen.  Therapeutic Interventions: 1 to 1 sessions, Unit Group sessions and Medication administration.  Evaluation of Outcomes: Not Met   RN Treatment Plan for Primary Diagnosis: <principal problem not specified> Long Term Goal(s): Knowledge of disease and therapeutic regimen to maintain health will improve  Short Term Goals: Ability to remain free from injury will improve, Ability to verbalize frustration and anger appropriately will improve, Ability to demonstrate self-control, Ability to participate in decision making will improve, Ability to verbalize feelings will improve,  Ability to identify and develop effective coping behaviors will improve and Compliance with prescribed medications will improve  Medication Management: RN will administer medications as ordered by provider, will assess and evaluate patient's response and provide education to patient for prescribed medication. RN will report any adverse and/or side effects to prescribing provider.  Therapeutic Interventions: 1 on 1 counseling sessions, Psychoeducation, Medication administration, Evaluate responses to treatment, Monitor vital signs and CBGs as ordered, Perform/monitor CIWA, COWS, AIMS and Fall Risk screenings as ordered, Perform wound care treatments as ordered.  Evaluation of Outcomes: Not Met   LCSW Treatment Plan for Primary Diagnosis: <principal problem not specified> Long Term Goal(s): Safe transition to appropriate next level of care at discharge, Engage patient in therapeutic group addressing interpersonal concerns.  Short Term Goals: Engage patient in aftercare planning with referrals and resources, Increase social support, Increase ability to appropriately verbalize feelings, Facilitate acceptance of mental health diagnosis and concerns, Identify triggers associated with mental health/substance abuse issues and Increase skills for wellness and recovery  Therapeutic Interventions: Assess for all discharge needs, 1 to 1 time with Social worker, Explore available resources and support systems, Assess for adequacy in community support network, Educate family and significant other(s) on suicide prevention, Complete Psychosocial Assessment, Interpersonal group therapy.  Evaluation of Outcomes: Not Met   Progress in Treatment: Attending groups: No. Participating in groups: No. Taking  medication as prescribed: No. Toleration medication: Yes. Family/Significant other contact made: No, will contact:  if consent is given Patient understands diagnosis: Yes. Discussing patient identified  problems/goals with staff: Yes. Medical problems stabilized or resolved: Yes. Denies suicidal/homicidal ideation: Yes. Issues/concerns per patient self-inventory: No.   New problem(s) identified: No, Describe:  none  New Short Term/Long Term Goal(s): medication stabilization, elimination of SI thoughts, development of comprehensive mental wellness plan.   Patient Goals:  "Really need protection from sex trafficking ring"   Discharge Plan or Barriers:  Patient recently admitted. CSW will continue to follow and assess for appropriate referrals and possible discharge planning.   Reason for Continuation of Hospitalization: Aggression Delusions  Hallucinations Mania Medication stabilization  Estimated Length of Stay: 3-5 days   Attendees: Patient: Julia [name] 03/17/2020   Physician: Melba Coon, MD 03/17/2020   Nursing:  03/17/2020  RN Care Manager: 03/17/2020   Social Worker: [name], LCSW 03/17/2020   Recreational Therapist:  03/17/2020  Other:  03/17/2020   Other:  03/17/2020   Other: 03/17/2020      Scribe for Treatment Team: Vassie Moselle, LCSW 03/17/2020 1:01 PM

## 2020-03-17 NOTE — Progress Notes (Signed)
[facility] MD Progress Note  03/17/2020 8:12 PM Julia [name]  MRN:  [id number] Subjective: "I need protection from the antichrist."  Objective:  Julia [name] is a 19 y.o. female  with a past psychiatric history significant for schizophrenia, unspecified depression, methamphetamine use disorder in partial remission who was admitted on 10/16/2019 was discharged on Cogentin, fluoxetine, Invega 12 mg p.o. nightly and trazodone. She apparently had a video visit on 12/17/2019. The medication list at that time was the same as a discharge. At the time of admission she is screaming, yelling, agitated. She has a longstanding history of psychosis thought to be schizophrenia. She has a long history of polysubstance issues including methamphetamines. During the course of her previous hospitalization she contacts or attempts to contact a boyfriend of such and tries to get involved with him. Her comments today was that she was "sort of living" with her boyfriend. She has been followed by an [employer]. Their service stated that she had not been stable since being with them. She has been followed by Jeannene Patella since February 2021.   Patient is seen and examined.  [name] with the above-stated past psychiatric history who is seen in follow-up.  She was pleasant upon initial approach, however she becomes easily agitated and her mood has been significantly labile.  She becomes verbally and physically aggressive with none-little provocation from peers.  She is responding to internal stimuli saying that the antichrist and her abuser are in her head.  She continues to remain paranoid and believes that she was sex traffic.  She states that she came to the hospital for help and realized that there will "very important people there that were trying to traffic others."  She feels as if she has been abused by mental health hospitals. Her vital signs are stable other than a mild tachycardia and elevated  blood pressure when irritated, she is afebrile.  Her sleep is poor.  She has not completed a urine drug screen for this hospital visit.   Principal Problem: Schizophrenia ([place]) Diagnosis: Principal Problem:   Schizophrenia ([place]) Active Problems:   Polysubstance abuse ([place])   Depressed mood  Total Time spent with patient: 35 min  Past Psychiatric History: See admission H&P  Past Medical History:  Past Medical History:  Diagnosis Date  . Burning with urination 05/03/2015  . Contraceptive management 05/03/2015  . Depression   . Dysmenorrhea 12/30/2013  . Heroin addiction ([place])   . Menorrhagia 12/30/2013  . Menstrual extraction 12/30/2013  . Migraines   . Social anxiety disorder 09/25/2016  . Suicidal ideations   . Vaginal odor 05/03/2015    Past Surgical History:  Procedure Laterality Date  . NO PAST SURGERIES     Family History:  Family History  Problem Relation Age of Onset  . Depression Mother   . Hypertension Father   . Hyperlipidemia Father   . Cancer Paternal Grandmother        breast, uterine  . Cirrhosis Paternal Grandfather        due to alcohol   Family Psychiatric  History: See admission H&P Social History:  Social History   Substance and Sexual Activity  Alcohol Use Yes   Comment: BAC was clear     Social History   Substance and Sexual Activity  Drug Use Yes  . Types: Marijuana, Oxycodone   Comment: reports oxycodone and marijuana    Social History   Socioeconomic History  . Marital status: Single  Spouse name: Not on file  . Number of children: Not on file  . Years of education: Not on file  . Highest education level: Not on file  Occupational History  . Occupation: Unemployed  Tobacco Use  . Smoking status: Current Every Day Smoker    Packs/day: 1.00    Types: Cigarettes  . Smokeless tobacco: Never Used  . Tobacco comment: Not ready to quit  Vaping Use  . Vaping Use: Never used  Substance and Sexual Activity  . Alcohol use: Yes     Comment: BAC was clear  . Drug use: Yes    Types: Marijuana, Oxycodone    Comment: reports oxycodone and marijuana  . Sexual activity: Yes    Birth control/protection: Implant  Other Topics Concern  . Not on file  Social History Narrative   06/23/2019:  Pt stated that she is homeless, that she is a high school graduate, and that she is unemployed and not followed by any outpatient provider.      Lives with Dad. Mom has LGD, lives in [place]. 11th grader. Dog.   Social Determinants of Health   Financial Resource Strain:   . Difficulty of Paying Living Expenses: Not on file  Food Insecurity:   . Worried About Charity fundraiser in the Last Year: Not on file  . Ran Out of Food in the Last Year: Not on file  Transportation Needs:   . Lack of Transportation (Medical): Not on file  . Lack of Transportation (Non-Medical): Not on file  Physical Activity:   . Days of Exercise per Week: Not on file  . Minutes of Exercise per Session: Not on file  Stress:   . Feeling of Stress : Not on file  Social Connections:   . Frequency of Communication with Friends and Family: Not on file  . Frequency of Social Gatherings with Friends and Family: Not on file  . Attends Religious Services: Not on file  . Active Member of Clubs or Organizations: Not on file  . Attends Archivist Meetings: Not on file  . Marital Status: Not on file   Additional Social History:                         Sleep: Poor  Appetite:  Fair  Current Medications: Current Facility-Administered Medications  Medication Dose Route Frequency Provider Last Rate Last Admin  . acetaminophen (TYLENOL) tablet 650 mg  650 mg Oral Q6H PRN Sharma Covert, MD      . alum & mag hydroxide-simeth (MAALOX/MYLANTA) 200-200-20 MG/5ML suspension 30 mL  30 mL Oral Q4H PRN Sharma Covert, MD      . benztropine (COGENTIN) tablet 2 mg  2 mg Oral BID PRN [name], MD       Or  . benztropine mesylate (COGENTIN)  injection 2 mg  2 mg Intramuscular BID PRN [name], MD      . FLUoxetine (PROZAC) capsule 20 mg  20 mg Oral Daily Sharma Covert, MD   20 mg at 03/17/20 0800  . hydrOXYzine (ATARAX/VISTARIL) tablet 50 mg  50 mg Oral TID PRN Sharma Covert, MD   50 mg at 03/16/20 2033  . LORazepam (ATIVAN) tablet 2 mg  2 mg Oral Q6H PRN Sharma Covert, MD   2 mg at 03/17/20 0800   Or  . LORazepam (ATIVAN) injection 2 mg  2 mg Intramuscular Q6H PRN Sharma Covert, MD      .  OLANZapine zydis (ZYPREXA) disintegrating tablet 10 mg  10 mg Oral Q8H PRN Sharma Covert, MD       And  . LORazepam (ATIVAN) tablet 1 mg  1 mg Oral PRN Sharma Covert, MD      . magnesium hydroxide (MILK OF MAGNESIA) suspension 30 mL  30 mL Oral Daily PRN Sharma Covert, MD      . nicotine polacrilex (NICORETTE) 2 MG gum           . nicotine polacrilex (NICORETTE) gum 2 mg  2 mg Oral PRN Connye Burkitt, NP   2 mg at 03/17/20 0956  . OLANZapine (ZYPREXA) 10 MG injection           . OLANZapine (ZYPREXA) injection 5 mg  5 mg Intramuscular Once PRN [name], MD      . paliperidone (INVEGA) 24 hr tablet 9 mg  9 mg Oral QHS [name], MD      . traZODone (DESYREL) tablet 100 mg  100 mg Oral QHS PRN [name], MD        Lab Results:  Results for orders placed or performed during the hospital encounter of 03/16/20 (from the past 48 hour(s))  CBC with Differential/Platelet     Status: None   Collection Time: 03/17/20  6:29 AM  Result Value Ref Range   WBC 8.2 4.0 - 10.5 K/uL   RBC 4.61 3.87 - 5.11 MIL/uL   Hemoglobin 13.3 12.0 - 15.0 g/dL   HCT 41.9 36 - 46 %   MCV 90.9 80.0 - 100.0 fL   MCH 28.9 26.0 - 34.0 pg   MCHC 31.7 30.0 - 36.0 g/dL   RDW 15.5 11.5 - 15.5 %   Platelets 267 150 - 400 K/uL   nRBC 0.0 0.0 - 0.2 %   Neutrophils Relative % 53 %   Neutro Abs 4.4 1.7 - 7.7 K/uL   Lymphocytes Relative 35 %   Lymphs Abs 2.9 0.7 - 4.0 K/uL   Monocytes Relative 9 %   Monocytes  Absolute 0.7 0 - 1 K/uL   Eosinophils Relative 1 %   Eosinophils Absolute 0.1 0 - 0 K/uL   Basophils Relative 1 %   Basophils Absolute 0.0 0 - 0 K/uL   nRBC HIDE 0 /100 WBC   Immature Granulocytes 1 %   Abs Immature Granulocytes 0.06 0.00 - 0.07 K/uL    Comment: Performed at [facility], [place] [address], [place], [place] [address]  Comprehensive metabolic panel     Status: Abnormal   Collection Time: 03/17/20  6:29 AM  Result Value Ref Range   Sodium 138 135 - 145 mmol/L   Potassium 3.8 3.5 - 5.1 mmol/L   Chloride 104 98 - 111 mmol/L   CO2 24 22 - 32 mmol/L   Glucose, Bld 96 70 - 99 mg/dL    Comment: Glucose reference range applies only to samples taken after fasting for at least 8 hours.   BUN 15 6 - 20 mg/dL   Creatinine, Ser 0.84 0.44 - 1.00 mg/dL   Calcium 9.1 8.9 - 10.3 mg/dL   Total Protein 7.3 6.5 - 8.1 g/dL   Albumin 4.0 3.5 - 5.0 g/dL   AST 18 15 - 41 U/L   ALT 16 0 - 44 U/L   Alkaline Phosphatase 83 38 - 126 U/L   Total Bilirubin 0.1 (L) 0.3 - 1.2 mg/dL   GFR calc non Af Amer >60 >  60 mL/min   GFR calc Af Amer >60 >60 mL/min   Anion gap 10 5 - 15    Comment: Performed at [facility], [place] [address], [place], [place] [address]  Hemoglobin A1c     Status: None   Collection Time: 03/17/20  6:29 AM  Result Value Ref Range   Hgb A1c MFr Bld 5.5 4.8 - 5.6 %    Comment: (NOTE) Pre diabetes:          5.7%-6.4%  Diabetes:              >6.4%  Glycemic control for   <7.0% adults with diabetes    Mean Plasma Glucose 111.15 mg/dL    Comment: Performed at [place] [address], [place], [place] [address]  hCG, serum, qualitative     Status: None   Collection Time: 03/17/20  6:29 AM  Result Value Ref Range   Preg, Serum NEGATIVE NEGATIVE    Comment:        THE SENSITIVITY OF THIS METHODOLOGY IS >10 mIU/mL. Performed at [facility], [place] [address], [place], [place] [address]     Blood Alcohol  level:  Lab Results  Component Value Date   [facility] <10 10/16/2019   ETH <10 57/84/6962    Metabolic Disorder Labs: Lab Results  Component Value Date   HGBA1C 5.5 03/17/2020   MPG 111.15 03/17/2020   MPG 108.28 10/16/2019   Lab Results  Component Value Date   PROLACTIN 113.0 (H) 11/11/2019   PROLACTIN 152.0 (H) 10/16/2019   Lab Results  Component Value Date   CHOL 138 10/16/2019   TRIG 60 10/16/2019   HDL 36 (L) 10/16/2019   CHOLHDL 3.8 10/16/2019   VLDL 12 10/16/2019   LDLCALC 90 10/16/2019   LDLCALC 96 05/17/2019    Physical Findings: AIMS: Facial and Oral Movements Muscles of Facial Expression: None, normal Lips and Perioral Area: None, normal Jaw: None, normal Tongue: None, normal,Extremity Movements Upper (arms, wrists, hands, fingers): None, normal Lower (legs, knees, ankles, toes): None, normal, Trunk Movements Neck, shoulders, hips: None, normal, Overall Severity Severity of abnormal movements (highest score from questions above): None, normal Incapacitation due to abnormal movements: None, normal Patient's awareness of abnormal movements (rate only patient's report): No Awareness, Dental Status Current problems with teeth and/or dentures?: No Does patient usually wear dentures?: No  CIWA:    COWS:     Musculoskeletal: Strength & Muscle Tone: within normal limits Gait & Station: normal Patient leans: N/A  Psychiatric Specialty Exam: Physical Exam Vitals and nursing note reviewed.  Constitutional:      Appearance: She is well-developed.  HENT:     Head: Normocephalic and atraumatic.     Nose: Nose normal.  Eyes:     Extraocular Movements: Extraocular movements intact.  Pulmonary:     Effort: Pulmonary effort is normal. No respiratory distress.  Musculoskeletal:        General: Normal range of motion.     Cervical back: Normal range of motion.  Neurological:     Mental Status: She is alert and oriented to person, place, and time.     Review of  Systems  Unable to perform ROS: Psychiatric disorder    Blood pressure (!) 119/106, pulse (!) 107, temperature 97.8 F (36.6 C), temperature source Oral, resp. rate 20, height 5\' 3"  (1.6 m), weight 73.5 kg, SpO2 100 %.Body mass index is 28.7 kg/m.  General Appearance: Casual  Eye Contact:  Minimal  Speech:  Pressured  Volume:  Increased  Mood:  Angry, Dysphoric and Irritable  Affect:  Constricted, Inappropriate and Labile  Thought Process:  Descriptions of Associations: Loose  Orientation:  Negative  Thought Content:  Hallucinations: Auditory Visual and Paranoid Ideation  Suicidal Thoughts:  No  Homicidal Thoughts:  No  Memory:  Immediate;   Poor Recent;   Poor Remote;   Poor  Judgement:  Impaired  Insight:  Lacking  Psychomotor Activity:  Increased  Concentration:  Concentration: Poor and Attention Span: Poor  Recall:  Poor  Fund of Knowledge:  Poor  Language:  Good  Akathisia:  No  Handed:  Right  AIMS (if indicated):     Assets:  Resilience  ADL's:  Intact  Cognition:  WNL  Sleep:   poor     Treatment Plan Summary: Daily contact with patient to assess and evaluate symptoms and progress in treatment, Medication management and Plan : Patient is seen and examined.  [name] with the above-stated past psychiatric history who is seen in follow-up.   [name] with schizophrenia and multiple psychiatric hospitalizations before he was brought to our facility secondary to psychosis, probable noncompliance of medications, suspected relapse on substances including previous use of amphetamines and marijuana.  A urine drug screen has not been obtained for this visit. Unfortunately we have no laboratory work on her, and so we will go on and order a CBC with differential, complete metabolic panel, urinalysis, urine drug screen and beta-hCG. She apparently was taking oral paliperidone, and an [place] 234 mg injection was provided on  03/16/2020.  I have added Invega 9 mg nightly to her regimen.  Today she required Zyprexa 5 mg IM at 1350 for acute agitation and threatening behaviors towards staff and peers. Further agitation protocol medications are ordered. We will attempt to get collateral from her outside providers/ [facility] team to confirm her outpatient medications.   1.  Continue Cogentin 2 mg p.o. twice daily as needed tremors and EPS. 2.  Continue fluoxetine 20 mg p.o. daily for depression and anxiety. 3.  Continue hydroxyzine 50 mg p.o. 3 times daily for anxiety and mood stability. 4.  Continue Geodon/Zyprexa as needed and lorazepam as needed for agitation. 5.  Continue paliperidone 9 mg p.o. daily for psychosis. 6.  Continue trazodone 50 mg p.o. nightly as needed insomnia. 7.  Disposition planning-in progress.   [name], MD 03/17/2020, 8:12 PM

## 2020-03-17 NOTE — Progress Notes (Signed)
°   03/17/20 2253  Psych Admission Type (Psych Patients Only)  Admission Status Involuntary  Psychosocial Assessment  Patient Complaints Irritability  Eye Contact Fair  Facial Expression Anxious;Pensive;Worried  Affect Angry;Anxious;Preoccupied  Speech Logical/coherent;Loud  Interaction Cautious;Forwards little;Guarded;Minimal  Motor Activity Fidgety;Restless  Appearance/Hygiene Unremarkable  Behavior Characteristics Appropriate to situation  Mood Labile  Thought Process  Coherency Circumstantial  Content Blaming others;Confabulation;Delusions;Paranoia  Delusions Paranoid;Persecutory  Perception Derealization;Hallucinations  Hallucination Auditory  Judgment Limited  Confusion Moderate  Danger to Self  Current suicidal ideation? Denies  Danger to Others  Danger to Others None reported or observed  D: Patient responded to all writer's questions. Pt appears to interact with peers.  A: Medications administered as prescribed. Support and encouragement provided as needed.  R: Patient remains safe on the unit. Will continue to monitor for safety and stability.

## 2020-03-17 NOTE — Progress Notes (Signed)
DAR NOTE: Patient presents with anxious affect and depressed mood.  Denies pain, auditory and visual hallucinations. Maintained on routine safety checks.  Medications given as prescribed.  Support and encouragement offered as needed. Will continue to monitor. 

## 2020-03-18 DIAGNOSIS — F1910 Other psychoactive substance abuse, uncomplicated: Secondary | ICD-10-CM | POA: Diagnosis not present

## 2020-03-18 DIAGNOSIS — R4589 Other symptoms and signs involving emotional state: Secondary | ICD-10-CM | POA: Diagnosis not present

## 2020-03-18 DIAGNOSIS — F200 Paranoid schizophrenia: Secondary | ICD-10-CM | POA: Diagnosis not present

## 2020-03-18 MED ORDER — OLANZAPINE 10 MG PO TBDP
20.0000 mg | ORAL_TABLET | Freq: Once | ORAL | Status: AC
  Administered 2020-03-18: 20 mg via ORAL

## 2020-03-18 MED ORDER — LORAZEPAM 2 MG/ML IJ SOLN: 2.0000 mg | Freq: Three times a day (TID) | INTRAMUSCULAR | Status: DC

## 2020-03-18 MED ORDER — DIVALPROEX SODIUM 500 MG PO DR TAB
1000.0000 mg | DELAYED_RELEASE_TABLET | Freq: Two times a day (BID) | ORAL | Status: DC
  Administered 2020-03-18 – 2020-03-22 (×8): 1000 mg via ORAL
  Filled 2020-03-18 (×13): qty 2

## 2020-03-18 MED ORDER — LORAZEPAM 2 MG/ML IJ SOLN
2.0000 mg | Freq: Two times a day (BID) | INTRAMUSCULAR | Status: DC | PRN
  Administered 2020-03-18 – 2020-03-22 (×4): 2 mg via INTRAMUSCULAR
  Filled 2020-03-18 (×5): qty 1

## 2020-03-18 MED ORDER — OLANZAPINE 10 MG PO TABS
20.0000 mg | ORAL_TABLET | Freq: Once | ORAL | Status: DC
  Filled 2020-03-18: qty 2

## 2020-03-18 MED ORDER — OLANZAPINE 10 MG PO TABS
20.0000 mg | ORAL_TABLET | Freq: Two times a day (BID) | ORAL | Status: DC
  Administered 2020-03-18 – 2020-03-21 (×6): 20 mg via ORAL
  Filled 2020-03-18 (×11): qty 2

## 2020-03-18 MED ORDER — DIVALPROEX SODIUM 500 MG PO DR TAB
1000.0000 mg | DELAYED_RELEASE_TABLET | Freq: Once | ORAL | Status: AC
  Administered 2020-03-18: 1000 mg via ORAL
  Filled 2020-03-18 (×2): qty 2

## 2020-03-18 MED ORDER — OLANZAPINE 10 MG PO TBDP
ORAL_TABLET | ORAL | Status: AC
  Filled 2020-03-18: qty 2

## 2020-03-18 NOTE — [facility] Group Notes (Signed)
Adult Psychoeducational Group Note  Date:  03/18/2020 Time:  11:12 AM  Group Topic/Focus:  Goals Group:   The focus of this group is to help patients establish daily goals to achieve during treatment and discuss how the patient can incorporate goal setting into their daily lives to aide in recovery.  Participation Level:  Minimal  Participation Quality:  Pt got up and down several times in the short time she was in group  Affect:  Angry and Labile  Cognitive:  Delusional  Insight: None  Engagement in Group:  Distracting  Modes of Intervention:  Discussion, Education, Exploration and Support  Additional Comments:  Pt was able to rate her energy level as a 5/10. States the reason she is here "I need proterction" Pt then walked out of the room and was screaming and yelling in the hallway  Paulino Rily 03/18/2020, 11:12 AM

## 2020-03-18 NOTE — Progress Notes (Signed)
[place] NOVEL CORONAVIRUS (COVID-19) DAILY CHECK-OFF SYMPTOMS - answer yes or no to each - every day NO YES  Have you had a fever in the past 24 hours?  . Fever (Temp > 37.80C / 100F) X   Have you had any of these symptoms in the past 24 hours? . New Cough .  Sore Throat  .  Shortness of Breath .  Difficulty Breathing .  Unexplained Body Aches   X   Have you had any one of these symptoms in the past 24 hours not related to allergies?   . Runny Nose .  Nasal Congestion .  Sneezing   X   If you have had runny nose, nasal congestion, sneezing in the past 24 hours, has it worsened?  X   EXPOSURES - check yes or no X   Have you traveled outside the state in the past 14 days?  X   Have you been in contact with someone with a confirmed diagnosis of COVID-19 or PUI in the past 14 days without wearing appropriate PPE?  X   Have you been living in the same home as a person with confirmed diagnosis of COVID-19 or a PUI (household contact)?    X   Have you been diagnosed with COVID-19?    X              What to do next: Answered NO to all: Answered YES to anything:   Proceed with unit schedule Follow the [facility] Inpatient Flowsheet.   

## 2020-03-18 NOTE — [facility] Group Notes (Signed)
  [facility] LCSW Group Therapy Note  Date/Time:  03/18/2020 11:15AM-12:00PM  Type of Therapy and Topic:  Group Therapy:  Feelings About Hospitalization  Participation Level:  Did Not Attend   Description of Group This process group involved patients discussing their feelings related to being hospitalized, as well as the benefits they see to being in the hospital.  These feelings and benefits were itemized.  The group then brainstormed specific ways in which they could seek those same benefits when they discharge and return home.  Therapeutic Goals 1. Patient will identify and describe positive and negative feelings related to hospitalization 2. Patient will verbalize benefits of hospitalization to themselves personally 3. Patients will brainstorm together ways they can obtain similar benefits in the outpatient setting, identify barriers to wellness and possible solutions  Summary of Patient Progress:  The patient did not attend group.  Therapeutic Modalities Cognitive Behavioral Therapy Motivational Interviewing    Selmer Dominion, LCSW 03/18/2020, 4:30 PM

## 2020-03-18 NOTE — Progress Notes (Signed)
   03/18/20 2315  Psych Admission Type (Psych Patients Only)  Admission Status Involuntary  Psychosocial Assessment  Patient Complaints Agitation;Anxiety  Eye Contact Fair  Facial Expression Anxious;Pensive;Worried  Affect Anxious;Preoccupied  Speech Logical/coherent;Loud  Interaction Cautious;Forwards little;Guarded;Minimal  Motor Activity Fidgety;Restless  Appearance/Hygiene Unremarkable  Behavior Characteristics Agitated  Mood Labile  Thought Process  Coherency Circumstantial  Content Blaming others;Confabulation;Delusions;Paranoia  Delusions Paranoid;Persecutory  Perception Derealization;Hallucinations  Hallucination Auditory  Judgment Limited  Confusion Moderate  Danger to Self  Current suicidal ideation? Denies  Danger to Others  Danger to Others None reported or observed  D: Patient pacing back and forth reports responding without command. Pt increasingly getting agitated due to roommates behavior and aggression. A: Medications administered as prescribed. Support and encouragement provided as needed.  R: Patient remains safe on the unit. Will continue to monitor for safety and stability.

## 2020-03-18 NOTE — Progress Notes (Signed)
EKG results placed on the outside of shadow chart  Normal sinus rhythm Normal ECG  QT/QTc  362/438 ms

## 2020-03-18 NOTE — Progress Notes (Signed)
[facility] MD Progress Note  03/18/2020 11:21 AM Julia [name]  MRN:  [id number] Subjective: "I do not have schizophrenia!  I am being tortured!"  Objective:  Julia [name] is a 19 y.o. female  with a past psychiatric history significant for schizophrenia, unspecified depression, methamphetamine use disorder in partial remission who was admitted on 10/16/2019 was discharged on Cogentin, fluoxetine, Invega 12 mg p.o. nightly and trazodone. She apparently had a video visit on 12/17/2019. The medication list at that time was the same as a discharge. At the time of admission she is screaming, yelling, agitated. She has a longstanding history of psychosis thought to be schizophrenia. She has a long history of polysubstance issues including methamphetamines. During the course of her previous hospitalization she contacts or attempts to contact a boyfriend of such and tries to get involved with him. Her comments today was that she was "sort of living" with her boyfriend. She has been followed by an [employer]. Their service stated that she had not been stable since being with them. She has been followed by Jeannene Patella since February 2021.  She has not completed a urine drug screen for this hospital visit.  Patient is seen and examined.  [name] with the above-stated past psychiatric history who is seen in follow-up.  Patient has remained psychotic, labile with agitated behaviors today requiring time in the quiet room.  Patient has continued to seek Xanax pretreatment.  She has been screaming stating she does not have schizophrenia, but describes that all of her problems started when she was 31 and saw and had a conversation with God and the devil.  Patient believes God has put her on a mission to save people that are victims of sex trafficking.  She states that there are very important people that she has to take down.  She remains paranoid that people are out to get her. Patient is  agreeable to a trial of Depakote along with Zyprexa to help calm her irritability and agitation in the place of benzodiazepines.  Patient had a positive response from medication regimen, however does still have some outbursts.  There are some behavioral/attention seeking aspects to her tantrums. Her vital signs are stable other than a mild tachycardia and elevated blood pressure when irritated, she is afebrile.  Her sleep was improved overnight, with nurses reporting 6.75 hours.  She states her appetite is good.     Principal Problem: Schizophrenia ([place]) Diagnosis: Principal Problem:   Schizophrenia ([place]) Active Problems:   Polysubstance abuse ([place])   Depressed mood  Total Time spent with patient: 65 minutes with frequent reassessment due to patient being in quiet room off and on through day.  Hands on and IM injections were not required.  Past Psychiatric History: See admission H&P  Past Medical History:  Past Medical History:  Diagnosis Date  . Burning with urination 05/03/2015  . Contraceptive management 05/03/2015  . Depression   . Dysmenorrhea 12/30/2013  . Heroin addiction ([place])   . Menorrhagia 12/30/2013  . Menstrual extraction 12/30/2013  . Migraines   . Social anxiety disorder 09/25/2016  . Suicidal ideations   . Vaginal odor 05/03/2015    Past Surgical History:  Procedure Laterality Date  . NO PAST SURGERIES     Family History:  Family History  Problem Relation Age of Onset  . Depression Mother   . Hypertension Father   . Hyperlipidemia Father   . Cancer Paternal Grandmother  breast, uterine  . Cirrhosis Paternal Grandfather        due to alcohol   Family Psychiatric  History: See admission H&P Social History:  Social History   Substance and Sexual Activity  Alcohol Use Yes   Comment: BAC was clear     Social History   Substance and Sexual Activity  Drug Use Yes  . Types: Marijuana, Oxycodone   Comment: reports oxycodone and marijuana     Social History   Socioeconomic History  . Marital status: Single    Spouse name: Not on file  . Number of children: Not on file  . Years of education: Not on file  . Highest education level: Not on file  Occupational History  . Occupation: Unemployed  Tobacco Use  . Smoking status: Current Every Day Smoker    Packs/day: 1.00    Types: Cigarettes  . Smokeless tobacco: Never Used  . Tobacco comment: Not ready to quit  Vaping Use  . Vaping Use: Never used  Substance and Sexual Activity  . Alcohol use: Yes    Comment: BAC was clear  . Drug use: Yes    Types: Marijuana, Oxycodone    Comment: reports oxycodone and marijuana  . Sexual activity: Yes    Birth control/protection: Implant  Other Topics Concern  . Not on file  Social History Narrative   06/23/2019:  Pt stated that she is homeless, that she is a high school graduate, and that she is unemployed and not followed by any outpatient provider.      Lives with Dad. Mom has LGD, lives in [place]. 11th grader. Dog.   Social Determinants of Health   Financial Resource Strain:   . Difficulty of Paying Living Expenses: Not on file  Food Insecurity:   . Worried About Charity fundraiser in the Last Year: Not on file  . Ran Out of Food in the Last Year: Not on file  Transportation Needs:   . Lack of Transportation (Medical): Not on file  . Lack of Transportation (Non-Medical): Not on file  Physical Activity:   . Days of Exercise per Week: Not on file  . Minutes of Exercise per Session: Not on file  Stress:   . Feeling of Stress : Not on file  Social Connections:   . Frequency of Communication with Friends and Family: Not on file  . Frequency of Social Gatherings with Friends and Family: Not on file  . Attends Religious Services: Not on file  . Active Member of Clubs or Organizations: Not on file  . Attends Archivist Meetings: Not on file  . Marital Status: Not on file   Additional Social History:                          Sleep: Improving  Appetite:  Fair  Current Medications: Current Facility-Administered Medications  Medication Dose Route Frequency Provider Last Rate Last Admin  . acetaminophen (TYLENOL) tablet 650 mg  650 mg Oral Q6H PRN Sharma Covert, MD      . alum & mag hydroxide-simeth (MAALOX/MYLANTA) 200-200-20 MG/5ML suspension 30 mL  30 mL Oral Q4H PRN Sharma Covert, MD      . benztropine (COGENTIN) tablet 2 mg  2 mg Oral BID PRN [name], MD       Or  . benztropine mesylate (COGENTIN) injection 2 mg  2 mg Intramuscular BID PRN [name], MD      .  divalproex (DEPAKOTE) DR tablet 1,000 mg  1,000 mg Oral BID [name], MD      . FLUoxetine (PROZAC) capsule 20 mg  20 mg Oral Daily Sharma Covert, MD   20 mg at 03/18/20 0751  . hydrOXYzine (ATARAX/VISTARIL) tablet 50 mg  50 mg Oral TID PRN Sharma Covert, MD   50 mg at 03/18/20 0824  . LORazepam (ATIVAN) injection 2 mg  2 mg Intramuscular Q8H [name], MD      . magnesium hydroxide (MILK OF MAGNESIA) suspension 30 mL  30 mL Oral Daily PRN Sharma Covert, MD      . nicotine polacrilex (NICORETTE) gum 2 mg  2 mg Oral PRN Connye Burkitt, NP   2 mg at 03/17/20 0956  . OLANZapine (ZYPREXA) injection 5 mg  5 mg Intramuscular Once PRN [name], MD      . OLANZapine [facility]) tablet 20 mg  20 mg Oral BID [name], MD      . traZODone (DESYREL) tablet 100 mg  100 mg Oral QHS PRN [name], MD   100 mg at 03/17/20 2113    Lab Results:  Results for orders placed or performed during the hospital encounter of 03/16/20 (from the past 48 hour(s))  CBC with Differential/Platelet     Status: None   Collection Time: 03/17/20  6:29 AM  Result Value Ref Range   WBC 8.2 4.0 - 10.5 K/uL   RBC 4.61 3.87 - 5.11 MIL/uL   Hemoglobin 13.3 12.0 - 15.0 g/dL   HCT 41.9 36 - 46 %   MCV 90.9 80.0 - 100.0 fL   MCH 28.9 26.0 - 34.0 pg   MCHC 31.7 30.0 - 36.0 g/dL   RDW 15.5 11.5 -  15.5 %   Platelets 267 150 - 400 K/uL   nRBC 0.0 0.0 - 0.2 %   Neutrophils Relative % 53 %   Neutro Abs 4.4 1.7 - 7.7 K/uL   Lymphocytes Relative 35 %   Lymphs Abs 2.9 0.7 - 4.0 K/uL   Monocytes Relative 9 %   Monocytes Absolute 0.7 0 - 1 K/uL   Eosinophils Relative 1 %   Eosinophils Absolute 0.1 0 - 0 K/uL   Basophils Relative 1 %   Basophils Absolute 0.0 0 - 0 K/uL   nRBC HIDE 0 /100 WBC   Immature Granulocytes 1 %   Abs Immature Granulocytes 0.06 0.00 - 0.07 K/uL    Comment: Performed at [facility] , [place] [address], [place], [place] [address]  Comprehensive metabolic panel     Status: Abnormal   Collection Time: 03/17/20  6:29 AM  Result Value Ref Range   Sodium 138 135 - 145 mmol/L   Potassium 3.8 3.5 - 5.1 mmol/L   Chloride 104 98 - 111 mmol/L   CO2 24 22 - 32 mmol/L   Glucose, Bld 96 70 - 99 mg/dL    Comment: Glucose reference range applies only to samples taken after fasting for at least 8 hours.   BUN 15 6 - 20 mg/dL   Creatinine, Ser 0.84 0.44 - 1.00 mg/dL   Calcium 9.1 8.9 - 10.3 mg/dL   Total Protein 7.3 6.5 - 8.1 g/dL   Albumin 4.0 3.5 - 5.0 g/dL   AST 18 15 - 41 U/L   ALT 16 0 - 44 U/L   Alkaline Phosphatase 83 38 - 126 U/L   Total Bilirubin 0.1 (L) 0.3 -  1.2 mg/dL   GFR calc non Af Amer >60 >60 mL/min   GFR calc Af Amer >60 >60 mL/min   Anion gap 10 5 - 15    Comment: Performed at [facility], [place] [address], [place], [place] [address]  Hemoglobin A1c     Status: None   Collection Time: 03/17/20  6:29 AM  Result Value Ref Range   Hgb A1c MFr Bld 5.5 4.8 - 5.6 %    Comment: (NOTE) Pre diabetes:          5.7%-6.4%  Diabetes:              >6.4%  Glycemic control for   <7.0% adults with diabetes    Mean Plasma Glucose 111.15 mg/dL    Comment: Performed at [place] [address], [place], [place] [address]  hCG, serum, qualitative     Status: None   Collection Time: 03/17/20  6:29 AM  Result Value Ref  Range   Preg, Serum NEGATIVE NEGATIVE    Comment:        THE SENSITIVITY OF THIS METHODOLOGY IS >10 mIU/mL. Performed at [facility], [place] [address], [place], [place] [address]     Blood Alcohol level:  Lab Results  Component Value Date   [facility] <10 10/16/2019   ETH <10 47/65/4650    Metabolic Disorder Labs: Lab Results  Component Value Date   HGBA1C 5.5 03/17/2020   MPG 111.15 03/17/2020   MPG 108.28 10/16/2019   Lab Results  Component Value Date   PROLACTIN 113.0 (H) 11/11/2019   PROLACTIN 152.0 (H) 10/16/2019   Lab Results  Component Value Date   CHOL 138 10/16/2019   TRIG 60 10/16/2019   HDL 36 (L) 10/16/2019   CHOLHDL 3.8 10/16/2019   VLDL 12 10/16/2019   LDLCALC 90 10/16/2019   LDLCALC 96 05/17/2019    Physical Findings: AIMS: Facial and Oral Movements Muscles of Facial Expression: None, normal Lips and Perioral Area: None, normal Jaw: None, normal Tongue: None, normal,Extremity Movements Upper (arms, wrists, hands, fingers): None, normal Lower (legs, knees, ankles, toes): None, normal, Trunk Movements Neck, shoulders, hips: None, normal, Overall Severity Severity of abnormal movements (highest score from questions above): None, normal Incapacitation due to abnormal movements: None, normal Patient's awareness of abnormal movements (rate only patient's report): No Awareness, Dental Status Current problems with teeth and/or dentures?: No Does patient usually wear dentures?: No  CIWA:    COWS:     Musculoskeletal: Strength & Muscle Tone: within normal limits Gait & Station: normal Patient leans: N/A  Psychiatric Specialty Exam: Physical Exam Vitals and nursing note reviewed.  Constitutional:      Appearance: She is well-developed.  HENT:     Head: Normocephalic and atraumatic.     Nose: Nose normal.  Eyes:     Extraocular Movements: Extraocular movements intact.  Pulmonary:     Effort: Pulmonary effort is normal. No  respiratory distress.  Musculoskeletal:        General: Normal range of motion.     Cervical back: Normal range of motion.  Neurological:     Mental Status: She is alert and oriented to person, place, and time.     Review of Systems  Unable to perform ROS: Psychiatric disorder    Blood pressure 108/77, pulse (!) 104, temperature 97.7 F (36.5 C), temperature source Oral, resp. rate 20, height 5\' 3"  (1.6 m), weight 73.5 kg, SpO2 99 %.Body mass index is  28.7 kg/m.  General Appearance: Casual  Eye Contact:  Minimal  Speech:  Pressured  Volume:  Increased  Mood:  Angry, Dysphoric and Irritable  Affect:  Constricted, Inappropriate and Labile  Thought Process:  Descriptions of Associations: Loose  Orientation:  Negative  Thought Content:  Hallucinations: Auditory Visual and Paranoid Ideation  Suicidal Thoughts:  No  Homicidal Thoughts:  No  Memory:  Immediate;   Poor Recent;   Poor Remote;   Poor  Judgement:  Impaired  Insight:  Lacking  Psychomotor Activity:  Increased  Concentration:  Concentration: Poor and Attention Span: Poor  Recall:  Poor  Fund of Knowledge:  Poor  Language:  Good  Akathisia:  No  Handed:  Right  AIMS (if indicated):     Assets:  Resilience  ADL's:  Intact  Cognition:  WNL  Sleep:  Number of Hours: 6.75     Treatment Plan Summary: Daily contact with patient to assess and evaluate symptoms and progress in treatment, Medication management and Plan : Patient is seen and examined.  [name] with the above-stated past psychiatric history who is seen in follow-up.   [name] with schizophrenia and multiple psychiatric hospitalizations before he was brought to our facility secondary to psychosis, probable noncompliance of medications, suspected relapse on substances including previous use of amphetamines and marijuana.  A urine drug screen has not been obtained for this visit. Unfortunately we have no laboratory  work on her, and so we will go on and order a CBC with differential, complete metabolic panel, urinalysis, urine drug screen and beta-hCG. She apparently was taking oral paliperidone, and an [place] 234 mg injection was provided on 03/16/2020.  Frequent outbursts today requiring time in the quiet room.  Medications as below. Further agitation protocol medications are ordered as below We will attempt to get collateral from her outside providers/ [facility] team to confirm her outpatient medications.   1.  Continue Cogentin 2 mg p.o. twice daily as needed tremors and EPS. 2.  Continue fluoxetine 20 mg p.o. daily for depression and anxiety. 3.  Continue hydroxyzine 50 mg p.o. 3 times daily for anxiety and mood stability. 4.  Continue Geodon/Zyprexa as needed and lorazepam as needed for agitation. 5.    Discontinue paliperidone 9 mg p.o. daily for psychosis-ineffective. 6.  Continue trazodone 100 mg p.o. nightly as needed insomnia. 7.  Start Depakote DR 1000 mg twice daily 8.  Valproic acid level to be drawn on 03/23/2020 9.  Discontinue p.o. Ativan as patient has been benzodiazepine seeking. 10.  IM Ativan for agitation protocol is in place. 11.  Zyprexa 20 mg twice daily to be administered with Depakote to help improve agitated/assaultive behaviors 12.  Recommend close monitoring with decreasing doses of Zyprexa once Depakote at therapeutic level and patient can engage in therapeutic relationship to learn new coping strategies. 13. ECG to be completed for medication monitoring. 14. Disposition planning-in progress.   [name], MD 03/18/2020, 11:21 AM

## 2020-03-18 NOTE — Progress Notes (Signed)
   03/18/20 0500  Psych Admission Type (Psych Patients Only)  Admission Status Involuntary  Psychosocial Assessment  Patient Complaints Anxiety;Irritability  Eye Contact Fair  Facial Expression Anxious;Pensive;Worried  Affect Anxious;Preoccupied  Speech Logical/coherent;Loud  Interaction Cautious;Forwards little;Guarded;Minimal  Motor Activity Fidgety;Restless  Appearance/Hygiene Unremarkable  Behavior Characteristics Cooperative;Anxious  Mood Labile  Thought Process  Coherency Circumstantial  Content Blaming others;Confabulation;Delusions;Paranoia  Delusions Paranoid;Persecutory  Perception Derealization;Hallucinations  Hallucination Auditory  Judgment Limited  Confusion Moderate  Danger to Self  Current suicidal ideation? Denies  Danger to Others  Danger to Others None reported or observed

## 2020-03-19 DIAGNOSIS — R4589 Other symptoms and signs involving emotional state: Secondary | ICD-10-CM | POA: Diagnosis not present

## 2020-03-19 DIAGNOSIS — F1910 Other psychoactive substance abuse, uncomplicated: Secondary | ICD-10-CM | POA: Diagnosis not present

## 2020-03-19 DIAGNOSIS — F200 Paranoid schizophrenia: Secondary | ICD-10-CM | POA: Diagnosis not present

## 2020-03-19 MED ORDER — PANTOPRAZOLE SODIUM 40 MG PO TBEC
40.0000 mg | DELAYED_RELEASE_TABLET | Freq: Every day | ORAL | Status: DC
  Administered 2020-03-19 – 2020-03-21 (×3): 40 mg via ORAL
  Filled 2020-03-19 (×5): qty 1

## 2020-03-19 MED ORDER — ONDANSETRON 4 MG PO TBDP
4.0000 mg | ORAL_TABLET | Freq: Once | ORAL | Status: AC
  Administered 2020-03-19: 4 mg via ORAL
  Filled 2020-03-19 (×2): qty 1

## 2020-03-19 MED ORDER — PROPRANOLOL HCL 10 MG PO TABS
10.0000 mg | ORAL_TABLET | Freq: Three times a day (TID) | ORAL | Status: DC | PRN
  Administered 2020-03-19 (×2): 10 mg via ORAL
  Filled 2020-03-19 (×2): qty 1

## 2020-03-19 MED ORDER — CALCIUM CARBONATE ANTACID 500 MG PO CHEW
400.0000 mg | CHEWABLE_TABLET | Freq: Every day | ORAL | Status: DC
  Administered 2020-03-19 – 2020-03-22 (×4): 400 mg via ORAL
  Filled 2020-03-19 (×7): qty 2

## 2020-03-19 NOTE — Progress Notes (Addendum)
Adult Psychoeducational Group Note  Date:  03/19/2020 Time:  3:26 AM  Group Topic/Focus:  Wrap-Up Group:   The focus of this group is to help patients review their daily goal of treatment and discuss progress on daily workbooks.  Participation Level:  Minimal  Participation Quality:  Drowsy, Resistant  Affect:  Anxious, Flat, Irritable, Lethargic  Cognitive:  Disorganized, Confused, Lacking  Insight: Lacking, Limited, Poor, Off topic  Engagement in Group:  Limited, Lacking  Modes of Intervention:  Discussion  Additional Comments:  Pt stated her goal for today was to focus on her treatment plan. Pt stated she felt she accomplished her goal today. Pt stated she talk with her doctor and social worker, regarding her care today. Pt stated been able to contact her father, and a family friend today, improved her overall day. Pt stated she took all her medication today from her providers. Pt rated her overall day a 1 out of 10. Writer asked pt what made her day a 1. Pt stated she has been having some auditory issues today. Pt stated her appetite was improved today and she attended all meals. Pt stated her sleep last night was good. Pt stated the goal for tonight was to get some rest. Pt stated she was in no physical pain. Pt stated she has been experiencing some auditory and visual hallucinations today. Writer asked pt when was the last time she experienced this. Pt stated she was having this issues right now. Pt nurse was made aware of the situation. Pt denies thoughts of harming herself or others. Pt stated she would alert staff if anything changes. Candy Sledge 03/19/2020, 3:26 AM

## 2020-03-19 NOTE — [facility] Group Notes (Signed)
[facility] LCSW Group Therapy Note  Date/Time:  03/19/2020  11:00AM-12:00PM  Type of Therapy and Topic:  Group Therapy:  Music and Mood  Participation Level:  Did Not Attend   Description of Group: In this process group, members listened to a variety of genres of music and identified that different types of music evoke different responses.  Patients were encouraged to identify music that was soothing for them and music that was energizing for them.  Patients discussed how this knowledge can help with wellness and recovery in various ways including managing depression and anxiety as well as encouraging healthy sleep habits.    Therapeutic Goals: Patients will explore the impact of different varieties of music on mood Patients will verbalize the thoughts they have when listening to different types of music Patients will identify music that is soothing to them as well as music that is energizing to them Patients will discuss how to use this knowledge to assist in maintaining wellness and recovery Patients will explore the use of music as a coping skill  Summary of Patient Progress:  N/A   Therapeutic Modalities: Solution Focused Brief Therapy Activity   Selmer Dominion, LCSW

## 2020-03-19 NOTE — Progress Notes (Addendum)
   03/19/20 1400  Psych Admission Type (Psych Patients Only)  Admission Status Involuntary  Psychosocial Assessment  Eye Contact Fair  Facial Expression Anxious;Pensive;Worried  Affect Anxious;Preoccupied  Speech Logical/coherent;Loud  Interaction Cautious;Forwards little;Guarded;Minimal  Motor Activity Fidgety;Restless  Appearance/Hygiene Unremarkable  Behavior Characteristics Anxious  Mood Labile  Thought Process  Coherency Circumstantial  Content Blaming others;Delusions;Paranoia  Delusions Paranoid;Persecutory  Perception Derealization;Hallucinations  Hallucination Auditory  Judgment Limited  Confusion Moderate  Danger to Self  Current suicidal ideation? Denies  Danger to Others  Danger to Others None reported or observed    D.:Patient remains preoccupied and labile- exhibits occasional outbursts, and becomes easily agitated when denied Ativan. Pt currently denies SI/HI Earlier in the shift,  pt observed responding to voices, yelling, visibly upset- tears streaming down her face. A: Pt redirected to the quiet room where she was allowed to "cool down" with the door open. Pt offered emotional support, and was administered scheduled medication and prn meds for increased anxiety.- Pt remains safe on the unit with Q 15 min checks

## 2020-03-19 NOTE — [facility] Group Notes (Signed)
Adult Psychoeducational Group Not Date:  03/19/2020 Time:  1000-1045 Group Topic/Focus: PROGRESSIVE RELAXATION. A group where deep breathing is taught and tensing and relaxation muscle groups is used. Imagery is used as well.  Pts are asked to imagine 3 pillars that hold them up when they are not able to hold themselves up.  Participation Level:  Walked in the room and walked out of the room   Paulino Rily 03/19/2020

## 2020-03-19 NOTE — Progress Notes (Signed)
Pt had one episode of nausea and vomiting this am- observed throwing up into toilet. Pt reports that she occasionally gets sick like this at home, stating, "this happens sometimes out the blue". Pt offered ginger ale with ice- Pt reports feeling better- observed filling out self inventory at the nurse's station.

## 2020-03-19 NOTE — Progress Notes (Signed)
[facility] MD Progress Note  03/19/2020 5:53 PM Julia [name]  MRN:  [id number] Subjective: " Stop blaming me!  Stop talking to me!"  Objective:  Julia [name] is a 19 y.o. female  with a past psychiatric history significant for schizophrenia, unspecified depression, methamphetamine use disorder in partial remission who was admitted on 10/16/2019 was discharged on Cogentin, fluoxetine, Invega 12 mg p.o. nightly and trazodone. She apparently had a video visit on 12/17/2019. The medication list at that time was the same as a discharge. At the time of admission she is screaming, yelling, agitated. She has a longstanding history of psychosis thought to be schizophrenia. She has a long history of polysubstance issues including methamphetamines. During the course of her previous hospitalization she contacts or attempts to contact a boyfriend of such and tries to get involved with him. Her comments today was that she was "sort of living" with her boyfriend. She has been followed by an [employer]. Their service stated that she had not been stable since being with them. She has been followed by Jeannene Patella since February 2021.  She has not completed a urine drug screen for this hospital visit.  Patient is seen and examined.  [name] with the above-stated past psychiatric history who is seen in follow-up.    Patient has remained psychotic, labile with agitated behaviors today.  She is responding to redirection, and willingly goes to quiet room.  She complains of nausea, heartburn and heart palpitations in the morning.  She is provided with Zofran, Inderal, and Tums.  Prescription for Protonix will be added at night.  After being encouraged to lay in the quiet room, an ECG was obtained which shows a normal sinus rhythm of 89 bpm with a QTC within acceptable range.  (See scanned document).  Patient is given a trial off of unit restriction today, and was able to tolerate attending outside  recreational therapy as well as ambulating to the dining hall without any behavioral difficulties.  She is denying suicidal or homicidal ideation.  She still has active auditory and visual hallucinations.  She continues to remain paranoid and delusional that she has been given admission from God to say 6 trafficked victims.  Patient had a positive response from medication regimen, however does still have some outbursts-but these are now more directable.  There are some behavioral/attention seeking aspects to her tantrums. Her vital signs are stable other than a mild tachycardia and elevated blood pressure when irritated, she is afebrile.  Her sleep was improved overnight, with nurses reporting 6.5 hours.  She states her appetite is good.     Principal Problem: Schizophrenia ([place]) Diagnosis: Principal Problem:   Schizophrenia ([place]) Active Problems:   Polysubstance abuse ([place])   Depressed mood  Total Time spent with patient: 35 minutes  Past Psychiatric History: See admission H&P  Past Medical History:  Past Medical History:  Diagnosis Date  . Burning with urination 05/03/2015  . Contraceptive management 05/03/2015  . Depression   . Dysmenorrhea 12/30/2013  . Heroin addiction ([place])   . Menorrhagia 12/30/2013  . Menstrual extraction 12/30/2013  . Migraines   . Social anxiety disorder 09/25/2016  . Suicidal ideations   . Vaginal odor 05/03/2015    Past Surgical History:  Procedure Laterality Date  . NO PAST SURGERIES     Family History:  Family History  Problem Relation Age of Onset  . Depression Mother   . Hypertension Father   . Hyperlipidemia Father   .  Cancer Paternal Grandmother        breast, uterine  . Cirrhosis Paternal Grandfather        due to alcohol   Family Psychiatric  History: See admission H&P Social History:  Social History   Substance and Sexual Activity  Alcohol Use Yes   Comment: BAC was clear     Social History   Substance and Sexual Activity   Drug Use Yes  . Types: Marijuana, Oxycodone   Comment: reports oxycodone and marijuana    Social History   Socioeconomic History  . Marital status: Single    Spouse name: Not on file  . Number of children: Not on file  . Years of education: Not on file  . Highest education level: Not on file  Occupational History  . Occupation: Unemployed  Tobacco Use  . Smoking status: Current Every Day Smoker    Packs/day: 1.00    Types: Cigarettes  . Smokeless tobacco: Never Used  . Tobacco comment: Not ready to quit  Vaping Use  . Vaping Use: Never used  Substance and Sexual Activity  . Alcohol use: Yes    Comment: BAC was clear  . Drug use: Yes    Types: Marijuana, Oxycodone    Comment: reports oxycodone and marijuana  . Sexual activity: Yes    Birth control/protection: Implant  Other Topics Concern  . Not on file  Social History Narrative   06/23/2019:  Pt stated that she is homeless, that she is a high school graduate, and that she is unemployed and not followed by any outpatient provider.      Lives with Dad. Mom has LGD, lives in [place]. 11th grader. Dog.   Social Determinants of Health   Financial Resource Strain:   . Difficulty of Paying Living Expenses: Not on file  Food Insecurity:   . Worried About Charity fundraiser in the Last Year: Not on file  . Ran Out of Food in the Last Year: Not on file  Transportation Needs:   . Lack of Transportation (Medical): Not on file  . Lack of Transportation (Non-Medical): Not on file  Physical Activity:   . Days of Exercise per Week: Not on file  . Minutes of Exercise per Session: Not on file  Stress:   . Feeling of Stress : Not on file  Social Connections:   . Frequency of Communication with Friends and Family: Not on file  . Frequency of Social Gatherings with Friends and Family: Not on file  . Attends Religious Services: Not on file  . Active Member of Clubs or Organizations: Not on file  . Attends Archivist  Meetings: Not on file  . Marital Status: Not on file   Additional Social History:                         Sleep: Improving  Appetite:  Fair  Current Medications: Current Facility-Administered Medications  Medication Dose Route Frequency Provider Last Rate Last Admin  . acetaminophen (TYLENOL) tablet 650 mg  650 mg Oral Q6H PRN Sharma Covert, MD   650 mg at 03/18/20 1659  . alum & mag hydroxide-simeth (MAALOX/MYLANTA) 200-200-20 MG/5ML suspension 30 mL  30 mL Oral Q4H PRN Sharma Covert, MD      . benztropine (COGENTIN) tablet 2 mg  2 mg Oral BID PRN [name], MD       Or  . benztropine mesylate (COGENTIN) injection 2 mg  2 mg Intramuscular BID PRN [name], MD      . calcium carbonate (TUMS - dosed in mg elemental calcium) chewable tablet 400 mg of elemental calcium  400 mg of elemental calcium Oral Daily [name], MD   400 mg of elemental calcium at 03/19/20 1053  . divalproex (DEPAKOTE) DR tablet 1,000 mg  1,000 mg Oral BID [name], MD   1,000 mg at 03/19/20 1660  . FLUoxetine (PROZAC) capsule 20 mg  20 mg Oral Daily Sharma Covert, MD   20 mg at 03/19/20 6301  . hydrOXYzine (ATARAX/VISTARIL) tablet 50 mg  50 mg Oral TID PRN Sharma Covert, MD   50 mg at 03/19/20 0815  . LORazepam (ATIVAN) injection 2 mg  2 mg Intramuscular BID PRN [name], MD   2 mg at 03/18/20 2114  . magnesium hydroxide (MILK OF MAGNESIA) suspension 30 mL  30 mL Oral Daily PRN Sharma Covert, MD      . nicotine polacrilex (NICORETTE) gum 2 mg  2 mg Oral PRN Connye Burkitt, NP   2 mg at 03/19/20 1507  . OLANZapine (ZYPREXA) injection 5 mg  5 mg Intramuscular Once PRN [name], MD      . OLANZapine [facility]) tablet 20 mg  20 mg Oral BID [name], MD   20 mg at 03/19/20 6010  . pantoprazole (PROTONIX) EC tablet 40 mg  40 mg Oral QHS [name], MD      . propranolol (INDERAL) tablet 10 mg  10 mg Oral TID PRN [name],  MD   10 mg at 03/19/20 1506  . traZODone (DESYREL) tablet 100 mg  100 mg Oral QHS PRN [name], MD   100 mg at 03/18/20 2108    Lab Results:  No results found for this or any previous visit (from the past 48 hour(s)).  Blood Alcohol level:  Lab Results  Component Value Date   ETH <10 10/16/2019   ETH <10 93/23/5573    Metabolic Disorder Labs: Lab Results  Component Value Date   HGBA1C 5.5 03/17/2020   MPG 111.15 03/17/2020   MPG 108.28 10/16/2019   Lab Results  Component Value Date   PROLACTIN 113.0 (H) 11/11/2019   PROLACTIN 152.0 (H) 10/16/2019   Lab Results  Component Value Date   CHOL 138 10/16/2019   TRIG 60 10/16/2019   HDL 36 (L) 10/16/2019   CHOLHDL 3.8 10/16/2019   VLDL 12 10/16/2019   LDLCALC 90 10/16/2019   LDLCALC 96 05/17/2019    Physical Findings: AIMS: Facial and Oral Movements Muscles of Facial Expression: None, normal Lips and Perioral Area: None, normal Jaw: None, normal Tongue: None, normal,Extremity Movements Upper (arms, wrists, hands, fingers): None, normal Lower (legs, knees, ankles, toes): None, normal, Trunk Movements Neck, shoulders, hips: None, normal, Overall Severity Severity of abnormal movements (highest score from questions above): None, normal Incapacitation due to abnormal movements: None, normal Patient's awareness of abnormal movements (rate only patient's report): No Awareness, Dental Status Current problems with teeth and/or dentures?: No Does patient usually wear dentures?: No  CIWA:    COWS:     Musculoskeletal: Strength & Muscle Tone: within normal limits Gait & Station: normal Patient leans: N/A  Psychiatric Specialty Exam: Physical Exam Vitals and nursing note reviewed.  Constitutional:      Appearance: She is well-developed.  HENT:     Head: Normocephalic and atraumatic.     Nose:  Nose normal.  Eyes:     Extraocular Movements: Extraocular movements intact.  Pulmonary:     Effort: Pulmonary effort  is normal. No respiratory distress.  Musculoskeletal:        General: Normal range of motion.     Cervical back: Normal range of motion.  Neurological:     Mental Status: She is alert and oriented to person, place, and time.     Review of Systems  Unable to perform ROS: Psychiatric disorder    Blood pressure 112/66, pulse (!) 140, temperature 98.3 F (36.8 C), temperature source Oral, resp. rate 18, height 5\' 3"  (1.6 m), weight 73.5 kg, SpO2 98 %.Body mass index is 28.7 kg/m.  General Appearance: Casual  Eye Contact:  Fair  Speech:  Pressured  Volume:  Normal  Mood:  Dysphoric and Irritable  Affect:  Constricted, Inappropriate and Labile  Thought Process:  Descriptions of Associations: Loose  Orientation:  Negative  Thought Content:  Hallucinations: Auditory Visual and Paranoid Ideation  Suicidal Thoughts:  No  Homicidal Thoughts:  No  Memory:  Immediate;   Poor Recent;   Poor Remote;   Poor  Judgement:  Impaired  Insight:  Lacking  Psychomotor Activity:  Increased  Concentration:  Concentration: Poor and Attention Span: Poor  Recall:  Poor  Fund of Knowledge:  Poor  Language:  Good  Akathisia:  No  Handed:  Right  AIMS (if indicated):     Assets:  Resilience  ADL's:  Intact  Cognition:  WNL  Sleep:  Number of Hours: 6.5     Treatment Plan Summary: Daily contact with patient to assess and evaluate symptoms and progress in treatment, Medication management and Plan : Patient is seen and examined.  [name] with the above-stated past psychiatric history who is seen in follow-up.   [name] with schizophrenia and multiple psychiatric hospitalizations before he was brought to our facility secondary to psychosis, probable noncompliance of medications, suspected relapse on substances including previous use of amphetamines and marijuana.  A urine drug screen has not been obtained for this visit. Unfortunately we have no laboratory  work on her, and so we will go on and order a CBC with differential, complete metabolic panel, urinalysis, urine drug screen and beta-hCG. She apparently was taking oral paliperidone, and an [place] 234 mg injection was provided on 03/16/2020.  We will attempt to get collateral from her outside providers/ [facility] team to confirm her outpatient medications.   1.  Continue Cogentin 2 mg p.o. twice daily as needed tremors and EPS. 2.  Continue fluoxetine 20 mg p.o. daily for depression and anxiety. 3.  Continue hydroxyzine 50 mg p.o. 3 times daily for anxiety and mood stability. 4.  Continue Geodon/Zyprexa as needed and lorazepam as needed for agitation. 5.  Zyprexa 20 mg twice daily to be administered with Depakote to help improve agitated/assaultive behaviors 6.  Start Depakote DR 1000 mg twice daily -Valproic acid level to be drawn on 03/23/2020 7.  Start Inderal 10 mg 3 times daily as needed for anxiety and heart palpitations 8.  Zofran  40 mg once for nausea we will continue to monitor 9.  Tums 3 times daily as needed for heartburn 10.  Protonix 40 mg at bedtime for reflux 11.  Continue trazodone 100 mg p.o. nightly as needed insomnia. 12.   IM Ativan for agitation protocol is in place.(discontinue p.o. Ativan as patient has been benzodiazepine seeking). Recommend close  monitoring with decreasing doses of Zyprexa once Depakote at therapeutic level and patient can engage in therapeutic relationship to learn new coping strategies. 13. ECG to be completed for medication monitoring. 14. Disposition planning-in progress.   [name], MD 03/19/2020, 5:53 PM

## 2020-03-19 NOTE — Progress Notes (Signed)
The patient's positive event for the day is that she went outside for fresh air. Her goal for tomorrow is to find "seven ways to calm myself down".

## 2020-03-19 NOTE — Progress Notes (Signed)
   03/19/20 2022  Psych Admission Type (Psych Patients Only)  Admission Status Involuntary  Psychosocial Assessment  Patient Complaints Anxiety;Other (Comment) (hearing voices)  Eye Contact Glaring  Facial Expression Flat  Affect Flat;Irritable  Speech Logical/coherent;Unremarkable  Interaction Assertive  Appearance/Hygiene Unremarkable  Behavior Characteristics Agitated;Irritable  Mood Anxious;Irritable  Thought Process  Coherency WDL  Content WDL  Delusions WDL  Perception WDL  Hallucination Auditory  Judgment WDL  Confusion WDL  Danger to Self  Current suicidal ideation? Denies  Danger to Others  Danger to Others None reported or observed  Patient wanting her medications early. Pacing halls and yelling out at times. Requesting PO Ativan but that was discontinued. Regular medications given and patient talking about it might be too much medications yet she asks for the ativan. Cursed under breath after telling her the ativan was discontinued. Will continue to monitor and redirect as needed.

## 2020-03-20 LAB — URINALYSIS, COMPLETE (UACMP) WITH MICROSCOPIC
Bilirubin Urine: NEGATIVE
Glucose, UA: NEGATIVE mg/dL
Hgb urine dipstick: NEGATIVE
Ketones, ur: NEGATIVE mg/dL
Leukocytes,Ua: NEGATIVE
Nitrite: NEGATIVE
Protein, ur: NEGATIVE mg/dL
Specific Gravity, Urine: 1.008 (ref 1.005–1.030)
pH: 7 (ref 5.0–8.0)

## 2020-03-20 LAB — RAPID URINE DRUG SCREEN, HOSP PERFORMED
Amphetamines: NOT DETECTED
Barbiturates: NOT DETECTED
Benzodiazepines: NOT DETECTED
Cocaine: NOT DETECTED
Opiates: NOT DETECTED
Tetrahydrocannabinol: POSITIVE — AB

## 2020-03-20 MED ORDER — FLUOXETINE HCL 20 MG PO CAPS
40.0000 mg | ORAL_CAPSULE | Freq: Every day | ORAL | Status: DC
  Administered 2020-03-21 – 2020-03-22 (×2): 40 mg via ORAL
  Filled 2020-03-20 (×4): qty 2

## 2020-03-20 NOTE — Progress Notes (Signed)
Pt stated she had an ok day, but continues to endorse AH.      03/20/20 2000  Psych Admission Type (Psych Patients Only)  Admission Status Involuntary  Psychosocial Assessment  Patient Complaints Anxiety  Eye Contact Fair  Facial Expression Flat  Affect Flat;Irritable  Speech Logical/coherent;Unremarkable  Interaction Assertive  Motor Activity Slow;Pacing  Appearance/Hygiene Unremarkable  Behavior Characteristics Cooperative  Mood Depressed  Thought Process  Coherency WDL  Content WDL  Delusions WDL  Perception WDL  Hallucination Auditory  Judgment WDL  Confusion WDL  Danger to Self  Current suicidal ideation? Denies  Danger to Others  Danger to Others None reported or observed

## 2020-03-20 NOTE — Progress Notes (Signed)
Recreation Therapy Notes  INPATIENT RECREATION THERAPY ASSESSMENT  Patient Details Name: Julia [name] MRN: [id number] DOB: 12/11/2000 Today's Date: 03/20/2020       Information Obtained From: Patient  Able to Participate in Assessment/Interview: Yes  Patient Presentation:  (Flat)  Reason for Admission (Per Patient): Other (Comments) (Pt stated she was not feeling safe.)  Patient Stressors:  (None identified)  Coping Skills:   Substance Abuse, TV, Music, Exercise, Deep Breathing, Talk, Art, Prayer, Avoidance, Hot Bath/Shower  Leisure Interests (2+):  Individual - Other (Comment), Social - Friends (Smoke weed)  Frequency of Recreation/Participation: Other (Comment) (Every now and then)  Awareness of Community Resources:  Yes  Community Resources:   (Pt unable to [employer] resources.)  Current Use:    If no, Barriers?:    Expressed Interest in [employer] Information: No  County of Residence:  [place]  Patient Main Form of Transportation: Walk  Patient Strengths:  Buyer, retail  Patient Identified Areas of Improvement:  None  Patient Goal for Hospitalization:  "to get protection because I'm scared for my life and other people's lives"  Current SI (including self-harm):  No  Current HI:  No  Current AVH: No  Staff Intervention Plan: Group Attendance, Collaborate with Interdisciplinary Treatment Team  Consent to Intern Participation: N/A    [name], LRT/CTRS  Ria Comment, Alyx Gee A 03/20/2020, 11:35 AM

## 2020-03-20 NOTE — Progress Notes (Addendum)
°   03/20/20 1053  Vital Signs  Temp 97.8 F (36.6 C)  Temp Source Oral  Pulse Rate (!) 116  Pulse Rate Source Dinamap  Resp 16  BP 112/76  BP Location Right Arm  BP Method Automatic  Patient Position (if appropriate) Standing   D: Patient denies SI/ HI, but patient admits to hearing voices. Patient rated both anxiety and depression 5/10.  Patient has been pacing in the hall and laughing. Urine specimen collected and sent STAT. Patient complained of a headache A:  Patient took scheduled medicine. 650mg  of Tylenol was given and relieved headache pain.  Support and encouragement provided Routine safety checks conducted every 15 minutes. Patient  Informed to notify staff with any concerns.   R:  Safety maintained.

## 2020-03-20 NOTE — Progress Notes (Signed)
[facility] MD Progress Note  03/20/2020 11:17 AM FORTUNE TOROSIAN  MRN:  [id number] Subjective: [name] with a past psychiatric history significant for schizophrenia, previous amphetamine use disorder and current cannabis use disorder who was admitted on 03/16/2020.  She had been brought to the behavioral health center by [facility] police.  On admission she was screaming, yelling and agitated.  Objective: Patient is seen and examined.  [name] with the above-stated past psychiatric history who is seen in follow-up.  She is better today than on admission.  She stated that she is fearful that she is going to be hurt.  She is afraid that "someone is going to nuke [place]".  She stated that "the boyfriend" she was staying with with someone she had met.  She admitted to smoking marijuana with him.  She stated she had been compliant with her oral medications.  On admission she had been given the 234 mg IM dosage of paliperidone.  Her current medications include Cogentin as needed, Depakote DR 1000 mg p.o. twice daily, fluoxetine 20 mg p.o. daily, Zyprexa 20 mg p.o. twice daily, Protonix, propranolol, trazodone.  She had been on oral paliperidone, but that was stopped and she was placed on Zyprexa as per above.  Her blood pressure stable.  She is slightly tachycardic with a rate of 116.  She slept 6.5 hours last night.  Laboratories from 9/3 showed essentially normal electrolytes including liver function enzymes.  Her CBC was normal.  Hemoglobin A1c was 5.5.  TSH was not obtained.  Urine pregnancy test was negative.  Drug screen was not obtained.  EKG from 9/5 showed normal sinus rhythm with a normal QTc interval.  Principal Problem: Schizophrenia ([place]) Diagnosis: Principal Problem:   Schizophrenia ([place]) Active Problems:   Polysubstance abuse ([place])   Depressed mood  Total Time spent with patient: 20 minutes  Past Psychiatric History: See admission H&P  Past Medical History:   Past Medical History:  Diagnosis Date  . Burning with urination 05/03/2015  . Contraceptive management 05/03/2015  . Depression   . Dysmenorrhea 12/30/2013  . Heroin addiction ([place])   . Menorrhagia 12/30/2013  . Menstrual extraction 12/30/2013  . Migraines   . Social anxiety disorder 09/25/2016  . Suicidal ideations   . Vaginal odor 05/03/2015    Past Surgical History:  Procedure Laterality Date  . NO PAST SURGERIES     Family History:  Family History  Problem Relation Age of Onset  . Depression Mother   . Hypertension Father   . Hyperlipidemia Father   . Cancer Paternal Grandmother        breast, uterine  . Cirrhosis Paternal Grandfather        due to alcohol   Family Psychiatric  History: See admission H&P Social History:  Social History   Substance and Sexual Activity  Alcohol Use Yes   Comment: BAC was clear     Social History   Substance and Sexual Activity  Drug Use Yes  . Types: Marijuana, Oxycodone   Comment: reports oxycodone and marijuana    Social History   Socioeconomic History  . Marital status: Single    Spouse name: Not on file  . Number of children: Not on file  . Years of education: Not on file  . Highest education level: Not on file  Occupational History  . Occupation: Unemployed  Tobacco Use  . Smoking status: Current Every Day Smoker    Packs/day: 1.00  Types: Cigarettes  . Smokeless tobacco: Never Used  . Tobacco comment: Not ready to quit  Vaping Use  . Vaping Use: Never used  Substance and Sexual Activity  . Alcohol use: Yes    Comment: BAC was clear  . Drug use: Yes    Types: Marijuana, Oxycodone    Comment: reports oxycodone and marijuana  . Sexual activity: Yes    Birth control/protection: Implant  Other Topics Concern  . Not on file  Social History Narrative   06/23/2019:  Pt stated that she is homeless, that she is a high school graduate, and that she is unemployed and not followed by any outpatient provider.       Lives with Dad. Mom has LGD, lives in [place]. 11th grader. Dog.   Social Determinants of Health   Financial Resource Strain:   . Difficulty of Paying Living Expenses: Not on file  Food Insecurity:   . Worried About Charity fundraiser in the Last Year: Not on file  . Ran Out of Food in the Last Year: Not on file  Transportation Needs:   . Lack of Transportation (Medical): Not on file  . Lack of Transportation (Non-Medical): Not on file  Physical Activity:   . Days of Exercise per Week: Not on file  . Minutes of Exercise per Session: Not on file  Stress:   . Feeling of Stress : Not on file  Social Connections:   . Frequency of Communication with Friends and Family: Not on file  . Frequency of Social Gatherings with Friends and Family: Not on file  . Attends Religious Services: Not on file  . Active Member of Clubs or Organizations: Not on file  . Attends Archivist Meetings: Not on file  . Marital Status: Not on file   Additional Social History:                         Sleep: Good  Appetite:  Fair  Current Medications: Current Facility-Administered Medications  Medication Dose Route Frequency Provider Last Rate Last Admin  . acetaminophen (TYLENOL) tablet 650 mg  650 mg Oral Q6H PRN Sharma Covert, MD   650 mg at 03/20/20 0948  . alum & mag hydroxide-simeth (MAALOX/MYLANTA) 200-200-20 MG/5ML suspension 30 mL  30 mL Oral Q4H PRN Sharma Covert, MD      . benztropine (COGENTIN) tablet 2 mg  2 mg Oral BID PRN [name], MD       Or  . benztropine mesylate (COGENTIN) injection 2 mg  2 mg Intramuscular BID PRN [name], MD      . calcium carbonate (TUMS - dosed in mg elemental calcium) chewable tablet 400 mg of elemental calcium  400 mg of elemental calcium Oral Daily [name], MD   400 mg of elemental calcium at 03/20/20 0752  . divalproex (DEPAKOTE) DR tablet 1,000 mg  1,000 mg Oral BID [name], MD   1,000 mg at 03/20/20  0753  . FLUoxetine (PROZAC) capsule 20 mg  20 mg Oral Daily Sharma Covert, MD   20 mg at 03/20/20 0753  . hydrOXYzine (ATARAX/VISTARIL) tablet 50 mg  50 mg Oral TID PRN Sharma Covert, MD   50 mg at 03/19/20 1852  . LORazepam (ATIVAN) injection 2 mg  2 mg Intramuscular BID PRN [name], MD   2 mg at 03/18/20 2114  . magnesium hydroxide (MILK OF MAGNESIA) suspension  30 mL  30 mL Oral Daily PRN Sharma Covert, MD      . nicotine polacrilex (NICORETTE) gum 2 mg  2 mg Oral PRN Connye Burkitt, NP   2 mg at 03/19/20 1507  . OLANZapine (ZYPREXA) injection 5 mg  5 mg Intramuscular Once PRN [name], MD      . OLANZapine [facility]) tablet 20 mg  20 mg Oral BID [name], MD   20 mg at 03/20/20 0755  . pantoprazole (PROTONIX) EC tablet 40 mg  40 mg Oral QHS [name], MD   40 mg at 03/19/20 1958  . propranolol (INDERAL) tablet 10 mg  10 mg Oral TID PRN [name], MD   10 mg at 03/19/20 1506  . traZODone (DESYREL) tablet 100 mg  100 mg Oral QHS PRN [name], MD   100 mg at 03/19/20 1955    Lab Results: No results found for this or any previous visit (from the past 37 hour(s)).  Blood Alcohol level:  Lab Results  Component Value Date   ETH <10 10/16/2019   ETH <10 71/24/5809    Metabolic Disorder Labs: Lab Results  Component Value Date   HGBA1C 5.5 03/17/2020   MPG 111.15 03/17/2020   MPG 108.28 10/16/2019   Lab Results  Component Value Date   PROLACTIN 113.0 (H) 11/11/2019   PROLACTIN 152.0 (H) 10/16/2019   Lab Results  Component Value Date   CHOL 138 10/16/2019   TRIG 60 10/16/2019   HDL 36 (L) 10/16/2019   CHOLHDL 3.8 10/16/2019   VLDL 12 10/16/2019   LDLCALC 90 10/16/2019   LDLCALC 96 05/17/2019    Physical Findings: AIMS: Facial and Oral Movements Muscles of Facial Expression: None, normal Lips and Perioral Area: None, normal Jaw: None, normal Tongue: None, normal,Extremity Movements Upper (arms, wrists, hands,  fingers): None, normal Lower (legs, knees, ankles, toes): None, normal, Trunk Movements Neck, shoulders, hips: None, normal, Overall Severity Severity of abnormal movements (highest score from questions above): None, normal Incapacitation due to abnormal movements: None, normal Patient's awareness of abnormal movements (rate only patient's report): No Awareness, Dental Status Current problems with teeth and/or dentures?: No Does patient usually wear dentures?: No  CIWA:    COWS:     Musculoskeletal: Strength & Muscle Tone: within normal limits Gait & Station: normal Patient leans: N/A  Psychiatric Specialty Exam: Physical Exam Vitals and nursing note reviewed.  Constitutional:      Appearance: Normal appearance.  HENT:     Head: Normocephalic and atraumatic.  Pulmonary:     Effort: Pulmonary effort is normal.  Neurological:     General: No focal deficit present.     Mental Status: She is alert and oriented to person, place, and time.     Review of Systems  Blood pressure 112/76, pulse (!) 116, temperature 97.8 F (36.6 C), temperature source Oral, resp. rate 16, height $RemoveBe'5\' 3"'bsCOenOho$  (1.6 m), weight 73.5 kg, SpO2 98 %.Body mass index is 28.7 kg/m.  General Appearance: Casual  Eye Contact:  Fair  Speech:  Normal Rate  Volume:  Normal  Mood:  Dysphoric  Affect:  Flat  Thought Process:  Coherent and Descriptions of Associations: Loose  Orientation:  Full (Time, Place, and Person)  Thought Content:  Delusions and Paranoid Ideation  Suicidal Thoughts:  No  Homicidal Thoughts:  No  Memory:  Immediate;   Poor Recent;   Poor Remote;   Poor  Judgement:  Impaired  Insight:  Lacking  Psychomotor Activity:  Decreased  Concentration:  Concentration: Fair and Attention Span: Fair  Recall:  [employer] of Knowledge:  Fair  Language:  Good  Akathisia:  Negative  Handed:  Right  AIMS (if indicated):     Assets:  Desire for Improvement Resilience  ADL's:  Intact  Cognition:  WNL   Sleep:  Number of Hours: 6.5     Treatment Plan Summary: Daily contact with patient to assess and evaluate symptoms and progress in treatment, Medication management and Plan : Patient is seen and examined.  [name] with the above-stated past psychiatric history who is seen in follow-up.   Diagnosis: 1.  Schizophrenia. 2.  Cannabis use disorder. 3.  History of polysubstance dependence  Pertinent findings on examination today: 1.  Continued delusional paranoia. 2.  Denied auditory or visual hallucinations.  Plan: 1.  Continue Cogentin 2 mg p.o. twice daily as needed tremor. 2.  Continue Depakote DR 1000 mg p.o. twice daily for mood stability. 3.  Continue fluoxetine 20 mg p.o. daily for depression and anxiety. 4.  Continue lorazepam 2 mg IM daily as needed severe agitation. 5.  Continue Zyprexa 5 mg IM as needed agitation. 6.  Continue Zyprexa 20 mg p.o. twice daily for psychosis. 7.  Continue Protonix 40 mg p.o. nightly for gastric protection. 8.  Continue propranolol 10 mg p.o. 3 times daily as needed tachycardia or anxiety. 9.  Reorder rapid urine drug screen. 10.  Reorder TSH. 11.  Continue trazodone 100 mg p.o. nightly as needed insomnia. 12.  Reorder urinalysis. 13.  Disposition planning-in progress.  Sharma Covert, MD 03/20/2020, 11:17 AM

## 2020-03-20 NOTE — Progress Notes (Signed)
Recreation Therapy Notes  Date: 9.6.21 Time: 1015 Location: 500 Hall Dayroom  Group Topic: Stress Management  Goal Area(s) Addresses:  Patient will identify positive stress management techniques. Patient will identify benefits of using stress management post d/c.  Behavioral Response: Minimal  Intervention: Stress Meditation  Activity: Meditation.  LRT played a meditation that focused on fostering positive morning energy that helps develop good energy for the day.  Patients were to listen and follow along as meditation played to engage in the activity.    Education:  Stress Management, Discharge Planning.   Education Outcome: Acknowledges Education  Clinical Observations/Feedback: Pt appeared flat and depressed in group.  Pt wasn't able to sit still for extended periods of time but was quiet.  Pt left early and did not return.   [name], LRT/CTRS        [name] A 03/20/2020 11:24 AM

## 2020-03-21 LAB — TSH: TSH: 0.876 u[IU]/mL (ref 0.350–4.500)

## 2020-03-21 MED ORDER — OLANZAPINE 10 MG PO TABS
10.0000 mg | ORAL_TABLET | Freq: Every day | ORAL | Status: DC
  Filled 2020-03-21: qty 1

## 2020-03-21 MED ORDER — HALOPERIDOL 5 MG PO TABS
10.0000 mg | ORAL_TABLET | Freq: Two times a day (BID) | ORAL | Status: DC
  Administered 2020-03-21 – 2020-03-22 (×2): 10 mg via ORAL
  Filled 2020-03-21 (×4): qty 2

## 2020-03-21 MED ORDER — PROPRANOLOL HCL 10 MG PO TABS
10.0000 mg | ORAL_TABLET | Freq: Three times a day (TID) | ORAL | Status: DC
  Administered 2020-03-21 – 2020-03-22 (×3): 10 mg via ORAL
  Filled 2020-03-21 (×8): qty 1

## 2020-03-21 MED ORDER — OLANZAPINE 10 MG PO TBDP
20.0000 mg | ORAL_TABLET | Freq: Every day | ORAL | Status: DC
  Filled 2020-03-21: qty 2

## 2020-03-21 MED ORDER — HALOPERIDOL 5 MG PO TABS
5.0000 mg | ORAL_TABLET | Freq: Every day | ORAL | Status: DC
  Administered 2020-03-21: 5 mg via ORAL
  Filled 2020-03-21 (×3): qty 1

## 2020-03-21 MED ORDER — OLANZAPINE 5 MG PO TABS
5.0000 mg | ORAL_TABLET | Freq: Every day | ORAL | Status: DC
  Administered 2020-03-22: 5 mg via ORAL
  Filled 2020-03-21 (×3): qty 1

## 2020-03-21 MED ORDER — HALOPERIDOL LACTATE 5 MG/ML IJ SOLN
10.0000 mg | Freq: Two times a day (BID) | INTRAMUSCULAR | Status: DC
  Filled 2020-03-21 (×4): qty 2

## 2020-03-21 MED ORDER — OLANZAPINE 5 MG PO TBDP
15.0000 mg | ORAL_TABLET | Freq: Every day | ORAL | Status: DC
  Administered 2020-03-21: 15 mg via ORAL
  Filled 2020-03-21 (×3): qty 1

## 2020-03-21 MED ORDER — HALOPERIDOL LACTATE 5 MG/ML IJ SOLN
5.0000 mg | Freq: Every day | INTRAMUSCULAR | Status: DC
  Filled 2020-03-21 (×2): qty 1

## 2020-03-21 NOTE — BHH Counselor (Signed)
Adult Comprehensive Assessment  Patient ID: Julia [name], female   DOB: [birth year], 19 y.o.   MRN: [id number]  Information Source: Information source: Patient  Current Stressors:  Patient states their primary concerns and needs for treatment are:: "Not feeling safe" Patient states their goals for this hospitilization and ongoing recovery are:: "Scared to go back to friends house. Someone is after me and I'm worried they will hurt them too" Educational / Learning stressors: Denies stressors Employment / Job issues: Stated she recently started a job at [employer].  Family Relationships: Denies stressors.  Financial / Lack of resources (include bankruptcy): Lack of income Housing / Lack of housing: Denies stressor Physical health (include injuries & life threatening diseases): Denies stressors Social relationships: Denies stressors Substance abuse: Denies stressors Bereavement / Loss: Mother died in 2017-10-10.  Living/Environment/Situation:  Living Arrangements: Friend Living conditions (as described by patient or guardian): Good Who else lives in the home?: "Marye Round, Roderic Palau, and their parents" How long has patient lived in current situation?: 1-1/2 months What is atmosphere in current home: ("Okay")  Family History:  Marital status: Single Are you sexually active?: Yes What is your sexual orientation?: heterosexual Has your sexual activity been affected by drugs, alcohol, medication, or emotional stress?: Medication Does patient have children?: No  Childhood History:  By whom was/is the patient raised?: Both parents Additional childhood history information: both parents raised her; divorced when she was 19yo Description of patient's relationship with caregiver when they were a child: Close to both growing up.  However, mother left when patient was 14yo to live in [place]. Patient's description of current relationship with people who raised him/her: Father -  relationship is better now than previously; Mother - died in 2017/10/10 How were you disciplined when you got in trouble as a child/adolescent?: Items taken away Does patient have siblings?: Yes Number of Siblings: 1 Description of patient's current relationship with siblings: Brother - "okay" relationship Did patient suffer any verbal/emotional/physical/sexual abuse as a child?: No Did patient suffer from severe childhood neglect?: No Has patient ever been sexually abused/assaulted/raped as an adolescent or adult?: No Was the patient ever a victim of a crime or a disaster?: No Witnessed domestic violence?: No Has patient been effected by domestic violence as an adult?: No  Education:  Highest grade of school patient has completed: High school graduate Currently a student?: No Learning disability?: No  Employment/Work Situation:   Employment situation: Employed What is the longest time patient has a held a job?: A few days Where was the patient employed at that time?: [employer] Did You Receive Any Psychiatric Treatment/Services While in the [employer]?: (No [employer]) Are There Guns or Other Weapons in [place]?: No  Financial Resources:   Museum/gallery curator resources: Medicaid, No income Does patient have a Programmer, applications or guardian?: No  Alcohol/Substance Abuse:   What has been your use of drugs/alcohol within the last 12 months?: States daily alcohol and cannabis use Alcohol/Substance Abuse Treatment Hx: Past Tx, Outpatient, Past Tx, Inpatient If yes, describe treatment: [place] in 2016/10/10, Oct 10, 2017, 02/2019, and 05/2019.  [facility] for outpatient Has alcohol/substance abuse ever caused legal problems?: Yes  Social Support System:   Patient's Community Support System: Good Describe Community Support System: Dad, Raeford Razor Type of faith/religion: Darrick Meigs How does patient's faith help to cope with current illness?: "I don't know"  Leisure/Recreation:   Leisure and  Hobbies: Has previously said she liked hanging out with friends; however, now she reports having no friends.  Strengths/Needs:   What is the patient's perception of their strengths?: Smart, independent Patient states they can use these personal strengths during their treatment to contribute to their recovery: Learn from mistakes Patient states these barriers may affect/interfere with their treatment: None Patient states these barriers may affect their return to the community: None Other important information patient would like considered in planning for their treatment: Has been established with [facility] which has applied for her to have disability.  Discharge Plan:   Currently receiving community mental health services: Yes (From Whom)([facility]) Patient states concerns and preferences for aftercare planning are: Stay with [facility] Patient states they will know when they are safe and ready for discharge when: "I don't know" Does patient have access to transportation?: No Does patient have financial barriers related to discharge medications?: Yes Patient description of barriers related to discharge medications: No income, although does now have Medicaid Plan for no access to transportation at discharge: To be assessed by CSW, calls to father and [name] Will patient be returning to same living situation after discharge?: Yes  Summary/Recommendations:   Summary and Recommendations (to be completed by the evaluator): Julia [name] is a 19 y.o. female. Patient presents voluntarily to [facility] behavioral health center for walk-in assessment. Patient initially reports she was transported to facility by her father then recalls that she called the police for help related to her believe that there is a "sex ring operating in [place]."  Patient will benefit from crisis stabilization, medication evaluation, group therapy and psychoeducation, in addition to  case management for discharge planning. At discharge it is recommended that Patient adhere to the established discharge plan and continue in treatment.

## 2020-03-21 NOTE — Progress Notes (Signed)
[facility] MD Progress Note  03/21/2020 12:01 PM Julia [name]  MRN:  [id number] Subjective:   [name] with a past psychiatric history significant for schizophrenia, previous amphetamine use disorder and current cannabis use disorder who was admitted on 03/16/2020.  She had been brought to the behavioral health center by [facility] police.  On admission she was screaming, yelling and agitated.  Objective: Patient is seen and examined.  [name] with the above-stated past psychiatric history who is seen in follow-up.  She states she is fine today.  She did admit that she saw "mountain man" this morning.  She stated she sees him in in her head.  She stated that she would like to return home with "Gerald Stabs", but if she could not do that she wanted to going to the "witness protection program".  When asked about whether any of the medicines ever helped her not worry or seeing "mountain man" she said no.  She denied any auditory or visual hallucinations.  She denied any suicidal or homicidal ideation.  Yesterday we began a transition to haloperidol from the oral olanzapine given that she is already on the paliperidone long-acting injection.  It was felt best if we were going to mix antipsychotics that we mixed as a typical with an atypical instead of 2 atypical drugs.  Early this a.m. her vital signs were stable including her heart rate.  She was afebrile.  Shortly thereafter they recorded a heart rate of 140.  She has been on propranolol for her tachycardia.  She slept 8 hours last night.  Her TSH from 9/7 was 0.876.  No other new laboratories.  Principal Problem: Schizophrenia ([place]) Diagnosis: Principal Problem:   Schizophrenia ([place]) Active Problems:   Polysubstance abuse ([place])   Depressed mood  Total Time spent with patient: 20 minutes  Past Psychiatric History: See admission H&P  Past Medical History:  Past Medical History:  Diagnosis Date  . Burning with  urination 05/03/2015  . Contraceptive management 05/03/2015  . Depression   . Dysmenorrhea 12/30/2013  . Heroin addiction ([place])   . Menorrhagia 12/30/2013  . Menstrual extraction 12/30/2013  . Migraines   . Social anxiety disorder 09/25/2016  . Suicidal ideations   . Vaginal odor 05/03/2015    Past Surgical History:  Procedure Laterality Date  . NO PAST SURGERIES     Family History:  Family History  Problem Relation Age of Onset  . Depression Mother   . Hypertension Father   . Hyperlipidemia Father   . Cancer Paternal Grandmother        breast, uterine  . Cirrhosis Paternal Grandfather        due to alcohol   Family Psychiatric  History: See admission H&P Social History:  Social History   Substance and Sexual Activity  Alcohol Use Yes   Comment: BAC was clear     Social History   Substance and Sexual Activity  Drug Use Yes  . Types: Marijuana, Oxycodone   Comment: reports oxycodone and marijuana    Social History   Socioeconomic History  . Marital status: Single    Spouse name: Not on file  . Number of children: Not on file  . Years of education: Not on file  . Highest education level: Not on file  Occupational History  . Occupation: Unemployed  Tobacco Use  . Smoking status: Current Every Day Smoker    Packs/day: 1.00    Types: Cigarettes  . Smokeless  tobacco: Never Used  . Tobacco comment: Not ready to quit  Vaping Use  . Vaping Use: Never used  Substance and Sexual Activity  . Alcohol use: Yes    Comment: BAC was clear  . Drug use: Yes    Types: Marijuana, Oxycodone    Comment: reports oxycodone and marijuana  . Sexual activity: Yes    Birth control/protection: Implant  Other Topics Concern  . Not on file  Social History Narrative   06/23/2019:  Pt stated that she is homeless, that she is a high school graduate, and that she is unemployed and not followed by any outpatient provider.      Lives with Dad. Mom has LGD, lives in [place]. 11th grader. Dog.    Social Determinants of Health   Financial Resource Strain:   . Difficulty of Paying Living Expenses: Not on file  Food Insecurity:   . Worried About Charity fundraiser in the Last Year: Not on file  . Ran Out of Food in the Last Year: Not on file  Transportation Needs:   . Lack of Transportation (Medical): Not on file  . Lack of Transportation (Non-Medical): Not on file  Physical Activity:   . Days of Exercise per Week: Not on file  . Minutes of Exercise per Session: Not on file  Stress:   . Feeling of Stress : Not on file  Social Connections:   . Frequency of Communication with Friends and Family: Not on file  . Frequency of Social Gatherings with Friends and Family: Not on file  . Attends Religious Services: Not on file  . Active Member of Clubs or Organizations: Not on file  . Attends Archivist Meetings: Not on file  . Marital Status: Not on file   Additional Social History:                         Sleep: Good  Appetite:  Fair  Current Medications: Current Facility-Administered Medications  Medication Dose Route Frequency Provider Last Rate Last Admin  . acetaminophen (TYLENOL) tablet 650 mg  650 mg Oral Q6H PRN Sharma Covert, MD   650 mg at 03/20/20 0948  . alum & mag hydroxide-simeth (MAALOX/MYLANTA) 200-200-20 MG/5ML suspension 30 mL  30 mL Oral Q4H PRN Sharma Covert, MD      . benztropine (COGENTIN) tablet 2 mg  2 mg Oral BID PRN [name], MD       Or  . benztropine mesylate (COGENTIN) injection 2 mg  2 mg Intramuscular BID PRN [name], MD      . calcium carbonate (TUMS - dosed in mg elemental calcium) chewable tablet 400 mg of elemental calcium  400 mg of elemental calcium Oral Daily [name], MD   400 mg of elemental calcium at 03/21/20 0742  . divalproex (DEPAKOTE) DR tablet 1,000 mg  1,000 mg Oral BID [name], MD   1,000 mg at 03/21/20 0742  . FLUoxetine (PROZAC) capsule 40 mg  40 mg Oral Daily  Sharma Covert, MD   40 mg at 03/21/20 3716  . haloperidol (HALDOL) tablet 5 mg  5 mg Oral Daily Sharma Covert, MD   5 mg at 03/21/20 9678   Or  . haloperidol lactate (HALDOL) injection 5 mg  5 mg Intramuscular Daily Sharma Covert, MD      . hydrOXYzine (ATARAX/VISTARIL) tablet 50 mg  50 mg Oral TID PRN Mallie Darting,  Cordie Grice, MD   50 mg at 03/20/20 2040  . LORazepam (ATIVAN) injection 2 mg  2 mg Intramuscular BID PRN [name], MD   2 mg at 03/20/20 1713  . magnesium hydroxide (MILK OF MAGNESIA) suspension 30 mL  30 mL Oral Daily PRN Sharma Covert, MD      . nicotine polacrilex (NICORETTE) gum 2 mg  2 mg Oral PRN Connye Burkitt, NP   2 mg at 03/21/20 0743  . OLANZapine (ZYPREXA) injection 5 mg  5 mg Intramuscular Once PRN [name], MD      . Derrill Memo ON 03/22/2020] OLANZapine (ZYPREXA) tablet 10 mg  10 mg Oral Daily Sharma Covert, MD      . OLANZapine zydis [facility]) disintegrating tablet 20 mg  20 mg Oral QHS Sharma Covert, MD      . pantoprazole (PROTONIX) EC tablet 40 mg  40 mg Oral QHS [name], MD   40 mg at 03/20/20 2040  . propranolol (INDERAL) tablet 10 mg  10 mg Oral TID PRN [name], MD   10 mg at 03/19/20 1506  . traZODone (DESYREL) tablet 100 mg  100 mg Oral QHS PRN [name], MD   100 mg at 03/20/20 2040    Lab Results:  Results for orders placed or performed during the hospital encounter of 03/16/20 (from the past 48 hour(s))  Urinalysis, Complete w Microscopic     Status: Abnormal   Collection Time: 03/20/20 11:57 AM  Result Value Ref Range   Color, Urine STRAW (A) YELLOW   APPearance CLEAR CLEAR   Specific Gravity, Urine 1.008 1.005 - 1.030   pH 7.0 5.0 - 8.0   Glucose, UA NEGATIVE NEGATIVE mg/dL   Hgb urine dipstick NEGATIVE NEGATIVE   Bilirubin Urine NEGATIVE NEGATIVE   Ketones, ur NEGATIVE NEGATIVE mg/dL   Protein, ur NEGATIVE NEGATIVE mg/dL   Nitrite NEGATIVE NEGATIVE   Leukocytes,Ua NEGATIVE NEGATIVE    RBC / HPF 0-5 0 - 5 RBC/hpf   WBC, UA 0-5 0 - 5 WBC/hpf   Bacteria, UA RARE (A) NONE SEEN   Squamous Epithelial / LPF 0-5 0 - 5    Comment: Performed at [facility], [place] [address] [place], [place] [address]  Rapid urine drug screen (hospital performed)     Status: Abnormal   Collection Time: 03/20/20 11:57 AM  Result Value Ref Range   Opiates NONE DETECTED NONE DETECTED   Cocaine NONE DETECTED NONE DETECTED   Benzodiazepines NONE DETECTED NONE DETECTED   Amphetamines NONE DETECTED NONE DETECTED   Tetrahydrocannabinol POSITIVE (A) NONE DETECTED   Barbiturates NONE DETECTED NONE DETECTED    Comment: (NOTE) DRUG SCREEN FOR MEDICAL PURPOSES ONLY.  IF CONFIRMATION IS NEEDED FOR ANY PURPOSE, NOTIFY LAB WITHIN 5 DAYS.  LOWEST DETECTABLE LIMITS FOR URINE DRUG SCREEN Drug Class                     Cutoff (ng/mL) Amphetamine and metabolites    1000 Barbiturate and metabolites    200 Benzodiazepine                 250 Tricyclics and metabolites     300 Opiates and metabolites        300 Cocaine and metabolites        300 THC  50 Performed at [facility], [place] [address] [place], [place] [address]   TSH     Status: None   Collection Time: 03/21/20  6:39 AM  Result Value Ref Range   TSH 0.876 0.350 - 4.500 uIU/mL    Comment: Performed by a 3rd Generation assay with a functional sensitivity of <=0.01 uIU/mL. Performed at [facility], [place] [address], [place], [place] [address]     Blood Alcohol level:  Lab Results  Component Value Date   [facility] <10 10/16/2019   ETH <10 97/08/6376    Metabolic Disorder Labs: Lab Results  Component Value Date   HGBA1C 5.5 03/17/2020   MPG 111.15 03/17/2020   MPG 108.28 10/16/2019   Lab Results  Component Value Date   PROLACTIN 113.0 (H) 11/11/2019   PROLACTIN 152.0 (H) 10/16/2019   Lab Results  Component Value Date   CHOL 138 10/16/2019   TRIG 60  10/16/2019   HDL 36 (L) 10/16/2019   CHOLHDL 3.8 10/16/2019   VLDL 12 10/16/2019   LDLCALC 90 10/16/2019   LDLCALC 96 05/17/2019    Physical Findings: AIMS: Facial and Oral Movements Muscles of Facial Expression: None, normal Lips and Perioral Area: None, normal Jaw: None, normal Tongue: None, normal,Extremity Movements Upper (arms, wrists, hands, fingers): None, normal Lower (legs, knees, ankles, toes): None, normal, Trunk Movements Neck, shoulders, hips: None, normal, Overall Severity Severity of abnormal movements (highest score from questions above): None, normal Incapacitation due to abnormal movements: None, normal Patient's awareness of abnormal movements (rate only patient's report): No Awareness, Dental Status Current problems with teeth and/or dentures?: No Does patient usually wear dentures?: No  CIWA:    COWS:     Musculoskeletal: Strength & Muscle Tone: within normal limits Gait & Station: normal Patient leans: N/A  Psychiatric Specialty Exam: Physical Exam Vitals and nursing note reviewed.  Constitutional:      Appearance: Normal appearance.  HENT:     Head: Normocephalic and atraumatic.  Pulmonary:     Effort: Pulmonary effort is normal.  Neurological:     General: No focal deficit present.     Mental Status: She is alert and oriented to person, place, and time.     Review of Systems  Blood pressure 116/77, pulse (!) 140, temperature (!) 97.4 F (36.3 C), temperature source Oral, resp. rate 18, height 5\' 3"  (1.6 m), weight 73.5 kg, SpO2 98 %.Body mass index is 28.7 kg/m.  General Appearance: Casual  Eye Contact:  Fair  Speech:  Normal Rate  Volume:  Decreased  Mood:  Anxious and Dysphoric  Affect:  Flat  Thought Process:  Goal Directed and Descriptions of Associations: Loose  Orientation:  Full (Time, Place, and Person)  Thought Content:  Delusions and Hallucinations: Visual  Suicidal Thoughts:  No  Homicidal Thoughts:  No  Memory:  Immediate;    Fair Recent;   Fair Remote;   Fair  Judgement:  Intact  Insight:  Fair  Psychomotor Activity:  Decreased  Concentration:  Concentration: Fair and Attention Span: Fair  Recall:  [employer] of Knowledge:  Fair  Language:  Good  Akathisia:  Negative  Handed:  Right  AIMS (if indicated):     Assets:  Desire for Improvement Resilience  ADL's:  Intact  Cognition:  WNL  Sleep:  Number of Hours: 8     Treatment Plan Summary: Daily contact with patient to assess and evaluate symptoms and progress in treatment, Medication management and Plan :  Patient is seen and examined.  [name] with the above-stated past psychiatric history who is seen in follow-up.   Diagnosis: 1.Schizophrenia. 2.  Cannabis use disorder. 3.  History of polysubstance dependence  Pertinent findings on examination today: 1.  Continued delusional paranoia. 2.    Patient admitted to visual hallucinations of "[place] man".  Plan: 1. Continue Cogentin 2 mg p.o. twice daily as needed tremor. 2.  Continue Depakote DR 1000 mg p.o. twice daily for mood stability. 3.  Continue fluoxetine 20 mg p.o. daily for depression and anxiety. 4.  Continue lorazepam 2 mg IM daily as needed severe agitation. 5.  Continue Zyprexa 5 mg IM as needed agitation. 6.  Decrease Zyprexa to 15 mg p.o. twice daily for psychosis. 7.  Continue Protonix 40 mg p.o. nightly for gastric protection. 8.  Continue propranolol 10 mg p.o. 3 times daily as needed tachycardia or anxiety. 9.  Increase haloperidol to 10 mg p.o./IM twice daily for psychosis 10.  Continue Protonix 40 mg p.o. nightly for gastric protection. 11.  Continue Inderal 10 mg p.o. 3 times daily but changed to standing.  This is for tachycardia. 12.  Continue trazodone 100 mg p.o. nightly as needed insomnia. 13.  Disposition planning-in progress.  Sharma Covert, MD 03/21/2020, 12:01 PM

## 2020-03-21 NOTE — Progress Notes (Signed)
Pt anxious and agitated on the unit, but able to be redirected     03/21/20 2200  Psych Admission Type (Psych Patients Only)  Admission Status Involuntary  Psychosocial Assessment  Patient Complaints Anxiety  Eye Contact Fair  Facial Expression Flat  Affect Flat;Irritable  Speech Logical/coherent;Unremarkable  Interaction Assertive  Motor Activity Slow;Pacing  Appearance/Hygiene Unremarkable  Behavior Characteristics Cooperative  Mood Depressed;Anxious  Thought Process  Coherency WDL  Content WDL  Delusions WDL  Perception WDL  Hallucination Auditory  Judgment WDL  Confusion WDL  Danger to Self  Current suicidal ideation? Denies  Danger to Others  Danger to Others None reported or observed

## 2020-03-21 NOTE — Progress Notes (Signed)
   03/21/20 0617  Vital Signs  Temp (!) 97.4 F (36.3 C)  Temp Source Oral  Pulse Rate 95  Resp 18  BP 129/86  BP Location Right Arm  BP Method Automatic  Patient Position (if appropriate) Sitting   D: Pt. Denies SI/HI, but reports hearing voices. Pt. Denies anxiety and depression. A: Pt. Took medicine.  R: Safety maintained

## 2020-03-22 MED ORDER — HALOPERIDOL 10 MG PO TABS: 10.0000 mg | ORAL_TABLET | Freq: Two times a day (BID) | ORAL | 0 refills | Status: DC

## 2020-03-22 MED ORDER — FLUOXETINE HCL 40 MG PO CAPS
40.0000 mg | ORAL_CAPSULE | Freq: Every day | ORAL | 0 refills | Status: DC
Start: 2020-03-22 — End: 2020-03-28

## 2020-03-22 MED ORDER — NICOTINE POLACRILEX 2 MG MT GUM
2.0000 mg | CHEWING_GUM | OROMUCOSAL | 0 refills | Status: DC | PRN
Start: 2020-03-22 — End: 2020-03-28

## 2020-03-22 MED ORDER — PROPRANOLOL HCL 10 MG PO TABS
10.0000 mg | ORAL_TABLET | Freq: Three times a day (TID) | ORAL | 1 refills | Status: DC
Start: 2020-03-22 — End: 2020-03-28

## 2020-03-22 MED ORDER — OLANZAPINE 5 MG PO TABS: 5.0000 mg | ORAL_TABLET | Freq: Every day | ORAL | 0 refills | Status: DC

## 2020-03-22 MED ORDER — HYDROXYZINE HCL 50 MG PO TABS
50.0000 mg | ORAL_TABLET | Freq: Three times a day (TID) | ORAL | 0 refills | Status: DC | PRN
Start: 2020-03-22 — End: 2020-03-28

## 2020-03-22 MED ORDER — HALOPERIDOL 5 MG PO TABS
15.0000 mg | ORAL_TABLET | Freq: Two times a day (BID) | ORAL | Status: DC
  Filled 2020-03-22 (×4): qty 3

## 2020-03-22 MED ORDER — BENZTROPINE MESYLATE 2 MG PO TABS
2.0000 mg | ORAL_TABLET | Freq: Two times a day (BID) | ORAL | 0 refills | Status: DC | PRN
Start: 2020-03-22 — End: 2020-03-28

## 2020-03-22 MED ORDER — DIVALPROEX SODIUM 500 MG PO DR TAB: 1000.0000 mg | DELAYED_RELEASE_TABLET | Freq: Two times a day (BID) | ORAL | 0 refills | Status: DC

## 2020-03-22 MED ORDER — TRAZODONE HCL 100 MG PO TABS
100.0000 mg | ORAL_TABLET | Freq: Every evening | ORAL | 0 refills | Status: DC | PRN
Start: 2020-03-22 — End: 2020-03-28

## 2020-03-22 MED ORDER — PANTOPRAZOLE SODIUM 40 MG PO TBEC
40.0000 mg | DELAYED_RELEASE_TABLET | Freq: Every day | ORAL | 0 refills | Status: DC
Start: 2020-03-22 — End: 2020-03-28

## 2020-03-22 MED ORDER — OLANZAPINE 15 MG PO TBDP: 15.0000 mg | ORAL_TABLET | Freq: Every day | ORAL | 0 refills | Status: DC

## 2020-03-22 MED ORDER — HALOPERIDOL LACTATE 5 MG/ML IJ SOLN
15.0000 mg | Freq: Two times a day (BID) | INTRAMUSCULAR | Status: DC
  Filled 2020-03-22 (×4): qty 3

## 2020-03-22 MED ORDER — HALOPERIDOL 5 MG PO TABS
15.0000 mg | ORAL_TABLET | Freq: Two times a day (BID) | ORAL | 0 refills | Status: DC
Start: 2020-03-22 — End: 2020-03-28

## 2020-03-22 MED ORDER — LORAZEPAM 1 MG PO TABS
ORAL_TABLET | ORAL | Status: AC
  Filled 2020-03-22: qty 1

## 2020-03-22 NOTE — [facility] Suicide Risk Assessment (Signed)
[place] INPATIENT:  Family/Significant Other Suicide Prevention Education  Suicide Prevention Education: Education Completed; Father, Bellah Alia ([phone]) has been identified by the patient as the family member/significant other with whom the patient will be residing, and identified as the person(s) who will aid the patient in the event of a mental health crisis (suicidal ideations/suicide attempt).  With written consent from the patient, the family member/significant other has been provided the following suicide prevention education, prior to the and/or following the discharge of the patient.     The family member/significant other verbalizes understanding of the suicide prevention education information provided.  The family member/significant other agrees to remove the items of safety concern listed above.  CSW spoke with this patients father who stated that he was under the impression this patient is unable to return to stay with her boyfriend. Patients father provided CSW with this patient's boyfriend, Chris's phone number [phone]. Patients father did express concerns with this patients belief that she is being followed and still believing that someone is out to get her, but stated otherwise he had no other safety concerns.   [name] MSW, LCSW Clincal Social Worker  [facility]

## 2020-03-22 NOTE — Progress Notes (Signed)
°  [facility] Adult Case Management Discharge Plan :  Will you be returning to the same living situation after discharge:  No. To stay with father. At discharge, do you have transportation home?: Yes,  father to pick this patient up Do you have the ability to pay for your medications: Yes,  has insurance and [employer]  Release of information consent forms completed and in the chart;  Patient's signature needed at discharge.  Patient to Follow up at:   Next level of care provider has access to [place] and Suicide Prevention discussed: Yes,  with father     Has patient been referred to the Quitline?: Patient refused referral  Patient has been referred for addiction treatment: Pt. refused referral  Vassie Moselle, [place] 03/22/2020, 1:27 PM

## 2020-03-22 NOTE — Tx Team (Signed)
Interdisciplinary Treatment and Diagnostic Plan Update  03/22/2020 Time of Session: 10:55am Julia [name] MRN: [id number]  Principal Diagnosis: Schizophrenia [facility])  Secondary Diagnoses: Principal Problem:   Schizophrenia ([place]) Active Problems:   Polysubstance abuse ([place])   Depressed mood   Current Medications:  Current Facility-Administered Medications  Medication Dose Route Frequency Provider Last Rate Last Admin  . acetaminophen (TYLENOL) tablet 650 mg  650 mg Oral Q6H PRN Sharma Covert, MD   650 mg at 03/20/20 0948  . alum & mag hydroxide-simeth (MAALOX/MYLANTA) 200-200-20 MG/5ML suspension 30 mL  30 mL Oral Q4H PRN Sharma Covert, MD      . benztropine (COGENTIN) tablet 2 mg  2 mg Oral BID PRN [name], MD       Or  . benztropine mesylate (COGENTIN) injection 2 mg  2 mg Intramuscular BID PRN [name], MD      . calcium carbonate (TUMS - dosed in mg elemental calcium) chewable tablet 400 mg of elemental calcium  400 mg of elemental calcium Oral Daily [name], MD   400 mg of elemental calcium at 03/22/20 0939  . divalproex (DEPAKOTE) DR tablet 1,000 mg  1,000 mg Oral BID [name], MD   1,000 mg at 03/22/20 0940  . FLUoxetine (PROZAC) capsule 40 mg  40 mg Oral Daily Sharma Covert, MD   40 mg at 03/22/20 0940  . haloperidol (HALDOL) tablet 15 mg  15 mg Oral BID Sharma Covert, MD       Or  . haloperidol lactate (HALDOL) injection 15 mg  15 mg Intramuscular BID Sharma Covert, MD      . hydrOXYzine (ATARAX/VISTARIL) tablet 50 mg  50 mg Oral TID PRN Sharma Covert, MD   50 mg at 03/21/20 1807  . LORazepam (ATIVAN) injection 2 mg  2 mg Intramuscular BID PRN [name], MD   2 mg at 03/21/20 2144  . magnesium hydroxide (MILK OF MAGNESIA) suspension 30 mL  30 mL Oral Daily PRN Sharma Covert, MD      . nicotine polacrilex (NICORETTE) gum 2 mg  2 mg Oral PRN Connye Burkitt, NP   2 mg at 03/22/20 1329  . OLANZapine (ZYPREXA)  injection 5 mg  5 mg Intramuscular Once PRN [name], MD      . OLANZapine [facility]) tablet 5 mg  5 mg Oral Daily Sharma Covert, MD   5 mg at 03/22/20 0940  . OLANZapine zydis (ZYPREXA) disintegrating tablet 15 mg  15 mg Oral QHS Sharma Covert, MD   15 mg at 03/21/20 2054  . pantoprazole (PROTONIX) EC tablet 40 mg  40 mg Oral QHS [name], MD   40 mg at 03/21/20 2054  . propranolol (INDERAL) tablet 10 mg  10 mg Oral TID Sharma Covert, MD   10 mg at 03/22/20 1329  . traZODone (DESYREL) tablet 100 mg  100 mg Oral QHS PRN [name], MD   100 mg at 03/21/20 2056   PTA Medications: Medications Prior to Admission  Medication Sig Dispense Refill Last Dose  . busPIRone (BUSPAR) 5 MG tablet Take 5 mg by mouth 3 (three) times daily.     [name] FLUoxetine (PROZAC) 20 MG capsule Take 20 mg by mouth daily.     . paliperidone (INVEGA) 3 MG 24 hr tablet Take 3 mg by mouth daily.       Patient Stressors: Health problems Medication  change or noncompliance Traumatic event  Patient Strengths: General fund of knowledge Physical Health Supportive family/friends  Treatment Modalities: Medication Management, Group therapy, Case management,  1 to 1 session with clinician, Psychoeducation, Recreational therapy.   Physician Treatment Plan for Primary Diagnosis: Schizophrenia ([place]) Long Term Goal(s): Improvement in symptoms so as ready for discharge Improvement in symptoms so as ready for discharge   Short Term Goals: Ability to identify changes in lifestyle to reduce recurrence of condition will improve Ability to verbalize feelings will improve Ability to demonstrate self-control will improve Ability to identify and develop effective coping behaviors will improve Ability to maintain clinical measurements within normal limits will improve Compliance with prescribed medications will improve Ability to identify triggers associated with substance abuse/mental health issues  will improve Ability to identify changes in lifestyle to reduce recurrence of condition will improve Ability to verbalize feelings will improve Ability to demonstrate self-control will improve Ability to identify and develop effective coping behaviors will improve Ability to maintain clinical measurements within normal limits will improve Compliance with prescribed medications will improve Ability to identify triggers associated with substance abuse/mental health issues will improve  Medication Management: Evaluate patient's response, side effects, and tolerance of medication regimen.  Therapeutic Interventions: 1 to 1 sessions, Unit Group sessions and Medication administration.  Evaluation of Outcomes: Adequate for Discharge  Physician Treatment Plan for Secondary Diagnosis: Principal Problem:   Schizophrenia ([place]) Active Problems:   Polysubstance abuse ([place])   Depressed mood  Long Term Goal(s): Improvement in symptoms so as ready for discharge Improvement in symptoms so as ready for discharge   Short Term Goals: Ability to identify changes in lifestyle to reduce recurrence of condition will improve Ability to verbalize feelings will improve Ability to demonstrate self-control will improve Ability to identify and develop effective coping behaviors will improve Ability to maintain clinical measurements within normal limits will improve Compliance with prescribed medications will improve Ability to identify triggers associated with substance abuse/mental health issues will improve Ability to identify changes in lifestyle to reduce recurrence of condition will improve Ability to verbalize feelings will improve Ability to demonstrate self-control will improve Ability to identify and develop effective coping behaviors will improve Ability to maintain clinical measurements within normal limits will improve Compliance with prescribed medications will improve Ability to identify  triggers associated with substance abuse/mental health issues will improve     Medication Management: Evaluate patient's response, side effects, and tolerance of medication regimen.  Therapeutic Interventions: 1 to 1 sessions, Unit Group sessions and Medication administration.  Evaluation of Outcomes: Adequate for Discharge   RN Treatment Plan for Primary Diagnosis: Schizophrenia ([place]) Long Term Goal(s): Knowledge of disease and therapeutic regimen to maintain health will improve  Short Term Goals: Ability to remain free from injury will improve, Ability to verbalize frustration and anger appropriately will improve, Ability to demonstrate self-control, Ability to participate in decision making will improve, Ability to verbalize feelings will improve, Ability to identify and develop effective coping behaviors will improve and Compliance with prescribed medications will improve  Medication Management: RN will administer medications as ordered by provider, will assess and evaluate patient's response and provide education to patient for prescribed medication. RN will report any adverse and/or side effects to prescribing provider.  Therapeutic Interventions: 1 on 1 counseling sessions, Psychoeducation, Medication administration, Evaluate responses to treatment, Monitor vital signs and CBGs as ordered, Perform/monitor CIWA, COWS, AIMS and Fall Risk screenings as ordered, Perform wound care treatments as ordered.  Evaluation of Outcomes: Adequate  for Discharge   LCSW Treatment Plan for Primary Diagnosis: Schizophrenia [facility]) Long Term Goal(s): Safe transition to appropriate next level of care at discharge, Engage patient in therapeutic group addressing interpersonal concerns.  Short Term Goals: Engage patient in aftercare planning with referrals and resources, Increase social support, Increase ability to appropriately verbalize feelings, Facilitate acceptance of mental health diagnosis and concerns,  Identify triggers associated with mental health/substance abuse issues and Increase skills for wellness and recovery  Therapeutic Interventions: Assess for all discharge needs, 1 to 1 time with Social worker, Explore available resources and support systems, Assess for adequacy in community support network, Educate family and significant other(s) on suicide prevention, Complete Psychosocial Assessment, Interpersonal group therapy.  Evaluation of Outcomes: Adequate for Discharge   Progress in Treatment: Attending groups: Yes. Participating in groups: No. Taking medication as prescribed: Yes. Toleration medication: Yes. Family/Significant other contact made: Yes, individual(s) contacted:  father Patient understands diagnosis: Yes. Discussing patient identified problems/goals with staff: Yes. Medical problems stabilized or resolved: Yes. Denies suicidal/homicidal ideation: Yes. Issues/concerns per patient self-inventory: No.   New problem(s) identified: No, Describe:  none  New Short Term/Long Term Goal(s): medication stabilization, elimination of SI thoughts, development of comprehensive mental wellness plan.   Patient Goals:  "Really need protection from sex trafficking ring"   Discharge Plan or Barriers:  Patient is to follow up with [facility] at discharge.  Reason for Continuation of Hospitalization: Delusions  Medication stabilization  Estimated Length of Stay: Adequate for discharge   Attendees: Patient:  03/22/2020  Physician:  03/22/2020   Nursing:  03/22/2020   RN Care Manager: 03/22/2020  Social Worker: [name], LCSW 03/22/2020   Recreational Therapist:  03/22/2020   Other:  03/22/2020  Other:  03/22/2020  Other: 03/22/2020       Scribe for Treatment Team: Vassie Moselle, LCSW 03/22/2020 2:16 PM

## 2020-03-22 NOTE — [facility] Suicide Risk Assessment (Signed)
[facility] Discharge Suicide Risk Assessment   Principal Problem: Schizophrenia [facility]) Discharge Diagnoses: Principal Problem:   Schizophrenia ([place]) Active Problems:   Polysubstance abuse ([place])   Depressed mood   Total Time spent with patient: 15 minutes  Musculoskeletal: Strength & Muscle Tone: within normal limits Gait & Station: normal Patient leans: N/A  Psychiatric Specialty Exam: Review of Systems  All other systems reviewed and are negative.   Blood pressure 92/68, pulse (!) 134, temperature (!) 97.4 F (36.3 C), temperature source Oral, resp. rate 18, height 5\' 3"  (1.6 m), weight 73.5 kg, SpO2 98 %.Body mass index is 28.7 kg/m.  General Appearance: Casual  Eye Contact::  Fair  Speech:  Normal Rate409  Volume:  Normal  Mood:  Anxious  Affect:  Congruent  Thought Process:  Coherent and Descriptions of Associations: Loose  Orientation:  Full (Time, Place, and Person)  Thought Content:  Delusions  Suicidal Thoughts:  No  Homicidal Thoughts:  No  Memory:  Immediate;   Fair Recent;   Fair Remote;   Fair  Judgement:  Intact  Insight:  Lacking  Psychomotor Activity:  Normal  Concentration:  Fair  Recall:  [employer] of Knowledge:Fair  Language: Good  Akathisia:  Negative  Handed:  Right  AIMS (if indicated):     Assets:  Desire for Improvement Resilience  Sleep:  Number of Hours: 8  Cognition: WNL  ADL's:  Intact   Mental Status Per Nursing Assessment::   On Admission:  NA  Demographic Factors:  Caucasian, Low socioeconomic status and Unemployed  Loss Factors: Financial problems/change in socioeconomic status  Historical Factors: Impulsivity  Risk Reduction Factors:   Positive therapeutic relationship  Continued Clinical Symptoms:  Alcohol/Substance Abuse/Dependencies Schizophrenia:   Less than 74 years old Paranoid or undifferentiated type  Cognitive Features That Contribute To Risk:  Thought constriction (tunnel vision)    Suicide Risk:   Minimal: No identifiable suicidal ideation.  Patients presenting with no risk factors but with morbid ruminations; may be classified as minimal risk based on the severity of the depressive symptoms    Plan Of Care/Follow-up recommendations:  Activity:  ad lib  Sharma Covert, MD 03/22/2020, 10:07 AM

## 2020-03-22 NOTE — [facility] Counselor (Signed)
CSW spoke with this patients friend, Gerald Stabs who stated that this patient is unable to return to live with him and also stated he was in the process of moving from his parents house himself.    [name] MSW, LCSW Clincal Social Worker  [facility]

## 2020-03-22 NOTE — Discharge Summary (Signed)
Physician Discharge Summary Note  Patient:  Julia [name] is an 19 y.o., female  MRN:  [id number]  DOB:  2001/02/10  Patient phone:  [phone] (home)   Patient address:   [place] [address],   Total Time spent with patient: Greater than 30 minutes  Date of Admission:  03/16/2020  Date of Discharge: 03/22/13/21  Reason for Admission: Altered Mental status.  Principal Problem: Schizophrenia.  Discharge Diagnoses: Principal Problem:   Schizophrenia ([place]) Active Problems:   Polysubstance abuse ([place])   Depressed mood  Past Psychiatric History: Schizophrenia, Major depressive disorder  Past Medical History:  Past Medical History:  Diagnosis Date  . Burning with urination 05/03/2015  . Contraceptive management 05/03/2015  . Depression   . Dysmenorrhea 12/30/2013  . Heroin addiction ([place])   . Menorrhagia 12/30/2013  . Menstrual extraction 12/30/2013  . Migraines   . Social anxiety disorder 09/25/2016  . Suicidal ideations   . Vaginal odor 05/03/2015    Past Surgical History:  Procedure Laterality Date  . NO PAST SURGERIES     Family History:  Family History  Problem Relation Age of Onset  . Depression Mother   . Hypertension Father   . Hyperlipidemia Father   . Cancer Paternal Grandmother        breast, uterine  . Cirrhosis Paternal Grandfather        due to alcohol   Family Psychiatric  History: See H&P  Social History:  Social History   Substance and Sexual Activity  Alcohol Use Yes   Comment: BAC was clear     Social History   Substance and Sexual Activity  Drug Use Yes  . Types: Marijuana, Oxycodone   Comment: reports oxycodone and marijuana    Social History   Socioeconomic History  . Marital status: Single    Spouse name: Not on file  . Number of children: Not on file  . Years of education: Not on file  . Highest education level: Not on file  Occupational History  . Occupation: Unemployed  Tobacco Use  . Smoking  status: Current Every Day Smoker    Packs/day: 1.00    Types: Cigarettes  . Smokeless tobacco: Never Used  . Tobacco comment: Not ready to quit  Vaping Use  . Vaping Use: Never used  Substance and Sexual Activity  . Alcohol use: Yes    Comment: BAC was clear  . Drug use: Yes    Types: Marijuana, Oxycodone    Comment: reports oxycodone and marijuana  . Sexual activity: Yes    Birth control/protection: Implant  Other Topics Concern  . Not on file  Social History Narrative   06/23/2019:  Pt stated that she is homeless, that she is a high school graduate, and that she is unemployed and not followed by any outpatient provider.      Lives with Dad. Mom has LGD, lives in [place]. 11th grader. Dog.   Social Determinants of Health   Financial Resource Strain:   . Difficulty of Paying Living Expenses: Not on file  Food Insecurity:   . Worried About Charity fundraiser in the Last Year: Not on file  . Ran Out of Food in the Last Year: Not on file  Transportation Needs:   . Lack of Transportation (Medical): Not on file  . Lack of Transportation (Non-Medical): Not on file  Physical Activity:   . Days of Exercise per Week: Not on file  . Minutes of Exercise per  Session: Not on file  Stress:   . Feeling of Stress : Not on file  Social Connections:   . Frequency of Communication with Friends and Family: Not on file  . Frequency of Social Gatherings with Friends and Family: Not on file  . Attends Religious Services: Not on file  . Active Member of Clubs or Organizations: Not on file  . Attends Archivist Meetings: Not on file  . Marital Status: Not on file   Hospital Course: (Per Md's admission evaluation): Patient is seen and examined. [name] familiar to me from multiple hospitalizations both at [facility] behavioral health as well as [facility] who presented to the [facility] on 03/16/2020 secondary to altered  mental status. The patient had been brought to the behavioral health center by [facility] police. At the time of the evaluation she was disorganized and responding to internal stimuli. She stated that she was there to get "checked out". She was last hospitalized at our facility on 10/15/2019. She was discharged on Cogentin, fluoxetine, Invega 12 mg p.o. nightly and trazodone. She apparently had a video visit on 12/17/2019. The medication list at that time was the same as a discharge. At the time of admission she is screaming, yelling, agitated. She has a longstanding history of psychosis thought to be schizophrenia. She has a long history of polysubstance issues including methamphetamines. During the course of her previous hospitalization she contacts or attempts to contact a boyfriend of such and tries to get involved with him. Her comments today was that she was "sort of living" with her boyfriend. She has been followed by an [employer]. Their service stated that she had not been stable since being with them. She has been followed by Jeannene Patella since February 2021. She was transferred from the [facility] facility for admission.  This is one of several psychiatric discharge summaries from the [facility] for this 19 year old Caucasian female with hx of chronic mental illness, substance use disorders & multiple psychiatric admissions. She is known in this [facility] & within the [facility] health systems from her previous admissions for mood stabilization as well as substance detoxification treatments. She first presented to the [facility] with the [facility] police dept with altered mental status. During her mental health evaluation at the [facility], patient was noted be disorganized & responding to internal stimuli. She was then transfered to the [facility] for further evaluation & treatments. Her UDS was positive for THC. Julia [name] has been tried on multiple psychotropic medications for her symptoms & it appeared she has  not been compliant with her recommended treatment regimen due to her drug use, homelessness issues & other behavioral problems.   After evaluation of her presenting symptoms this time around, [place] was recommended for mood stabilization treatments. The medication regimen for her presenting symptoms were discussed & with her consent initiated. She received, stabilized & was discharged on the medications as listed below on her discharge medication lists. She was also enrolled & participated in the group counseling sessions being offered & held on this unit. She learned coping skills. She presented on this admission, no other chronic medical conditions that required treatment & monitoring. She tolerated her treatment regimen without any adverse effects or reactions reported.  Zipporah's symptoms responded well to her treatment regimen. She is currently mentally & medically stable to be discharged to continue mental health care on an outpatient basis as noted below. During the course of  her hospitalization, the 15-minute checks were adequate to ensure her safety. She participated appropriately in the group sessions/therapies. Her medications were addressed & adjusted to meet her needs. She was recommended for outpatient follow-up care & medication management upon discharge to assure her continuity of care.  At the time of discharge, patient is not reporting any acute suicidal/homicidal ideations. She feels more confident about her self-care & in managing her symptoms. She currently denies any new issues or concerns. Education and supportive counseling provided throughout her hospital stay & upon discharge.  Today upon her discharge evaluation with the attending psychiatrist, Jasmon shares she is doing well. She denies any other specific concerns. She is sleeping well. Her appetite is good. She denies other physical complaints. She denies AH/VH. She feels that her medications have been helpful & is in agreement to  continue her current treatment regimen as recommended. She was able to engage in safety planning including plan to return to [facility] or contact emergency services if she feels unable to maintain her own safety or the safety of others. Pt had no further questions, comments, or concerns. She left [facility] with all personal belongings in no apparent distress. Transportation per family (father).  Musculoskeletal: Strength & Muscle Tone: within normal limits Gait & Station: normal Patient leans: N/A  Psychiatric Specialty Exam: Physical Exam Vitals and nursing note reviewed.  Constitutional:      Appearance: She is well-developed.  HENT:     Head: Normocephalic.  Eyes:     Pupils: Pupils are equal, round, and reactive to light.  Cardiovascular:     Comments: Elevated pulse rate Pulmonary:     Effort: No respiratory distress.     Breath sounds: No wheezing.  Abdominal:     Palpations: Abdomen is soft.  Genitourinary:    Comments: Deferred Musculoskeletal:        General: Normal range of motion.     Cervical back: Normal range of motion.  Skin:    General: Skin is warm and dry.  Neurological:     Mental Status: She is alert and oriented to person, place, and time. Mental status is at baseline.     Review of Systems  Constitutional: Negative for chills and fever.  HENT: Negative.  Negative for congestion and sore throat.   Eyes: Negative.   Respiratory: Negative for cough, shortness of breath and wheezing.   Cardiovascular: Negative for chest pain and palpitations.  Gastrointestinal: Negative for abdominal pain, heartburn, nausea and vomiting.  Genitourinary: Negative.   Musculoskeletal: Negative.   Skin: Negative.   Neurological: Negative for dizziness, tingling, tremors, sensory change, speech change, focal weakness, seizures, loss of consciousness, weakness and headaches.  Endo/Heme/Allergies: Negative.  Does not bruise/bleed easily.       Allergies: Abilify, Sertraline (Zoloft).   Psychiatric/Behavioral: Positive for depression (Stabilized with medication prior to discharge), hallucinations (Hx. psychosis (stabilized with medication prior to discharge)) and substance abuse (Hx. Amphetamine, Cocaine & THC disorders). Negative for memory loss and suicidal ideas. The patient has insomnia (Stabilized with medication prior to discharge). The patient is not nervous/anxious (Stable upon discharge).     Blood pressure 92/68, pulse (!) 134, temperature (!) 97.4 F (36.3 C), temperature source Oral, resp. rate 18, height 5\' 3"  (1.6 m), weight 73.5 kg, SpO2 98 %.Body mass index is 28.7 kg/m.  See Md's discharge SRA  Sleep:  Number of Hours: 8   Has this patient used any form of tobacco in the last 30 days? (Cigarettes, Smokeless Tobacco, Cigars,  and/or Pipes):Yes, an FDA-approved tobacco cessation medication was recommended at discharge.  Blood Alcohol level:  Lab Results  Component Value Date   ETH <10 10/16/2019   ETH <10 38/18/2993   Metabolic Disorder Labs:  Lab Results  Component Value Date   HGBA1C 5.5 03/17/2020   MPG 111.15 03/17/2020   MPG 108.28 10/16/2019   Lab Results  Component Value Date   PROLACTIN 113.0 (H) 11/11/2019   PROLACTIN 152.0 (H) 10/16/2019   Lab Results  Component Value Date   CHOL 138 10/16/2019   TRIG 60 10/16/2019   HDL 36 (L) 10/16/2019   CHOLHDL 3.8 10/16/2019   VLDL 12 10/16/2019   LDLCALC 90 10/16/2019   [place] 96 05/17/2019   See Psychiatric Specialty Exam and Suicide Risk Assessment completed by Attending Physician prior to discharge.  Discharge destination:  Home  Is patient on multiple antipsychotic therapies at discharge:  Yes,   Do you recommend tapering to monotherapy for antipsychotics?  Yes   Has Patient had three or more failed trials of antipsychotic monotherapy by history:  Yes,   Antipsychotic medications that previously failed include:   1.  Abilify., 2.  Lorayne Bender. and 3.  Seroquel.  Recommended Plan for  Multiple Antipsychotic Therapies: Additional reason(s) for multiple antispychotic treatment:  This patient has not been able to achieve symptoms control under an antipsychotic monotherapy & has already failed trials of 3 previous antipsychotic therapies. The combination of these two antipsychotic therapies (Haldol & Olanzapine) has proved effective in stabilizing patient's symptoms warranting this discharge.  Allergies as of 03/22/2020      Reactions   Abilify [aripiprazole]    Zoloft [sertraline Hcl]       Medication List    STOP taking these medications   busPIRone 5 MG tablet Commonly known as: BUSPAR   paliperidone 3 MG 24 hr tablet Commonly known as: INVEGA     TAKE these medications     Indication  benztropine 2 MG tablet Commonly known as: COGENTIN Take 1 tablet (2 mg total) by mouth 2 (two) times daily as needed for tremors (EPS).  Indication: Extrapyramidal Reaction caused by Medications   divalproex 500 MG DR tablet Commonly known as: DEPAKOTE Take 2 tablets (1,000 mg total) by mouth 2 (two) times daily. For mood stabilization  Indication: Mood stabilization   FLUoxetine 40 MG capsule Commonly known as: PROZAC Take 1 capsule (40 mg total) by mouth daily. For depression What changed:   medication strength  how much to take  additional instructions  Indication: Major Depressive Disorder   haloperidol 10 MG tablet Commonly known as: HALDOL Take 1 tablet (10 mg total) by mouth 2 (two) times daily. For mood control  Indication: Mood control   hydrOXYzine 50 MG tablet Commonly known as: ATARAX/VISTARIL Take 1 tablet (50 mg total) by mouth 3 (three) times daily as needed for anxiety.  Indication: Feeling Anxious   nicotine polacrilex 2 MG gum Commonly known as: NICORETTE Take 1 each (2 mg total) by mouth as needed. (May buy from over the counter): For smoking cessation  Indication: Nicotine Addiction   OLANZapine 5 MG tablet Commonly known as: ZYPREXA Take  1 tablet (5 mg total) by mouth daily. For mood control  Indication: Mood control   olanzapine zydis 15 MG disintegrating tablet Commonly known as: ZYPREXA Take 1 tablet (15 mg total) by mouth at bedtime. For mood control  Indication: Mood control   pantoprazole 40 MG tablet Commonly known as: PROTONIX Take 1 tablet (40  mg total) by mouth at bedtime. For acid rflux  Indication: Gastroesophageal Reflux Disease   propranolol 10 MG tablet Commonly known as: INDERAL Take 1 tablet (10 mg total) by mouth 3 (three) times daily. For anxiety  Indication: Feeling Anxious   traZODone 100 MG tablet Commonly known as: DESYREL Take 1 tablet (100 mg total) by mouth at bedtime as needed for sleep.  Indication: Trouble Sleeping      Follow-up recommendations: Activity:  As tolerated Diet: As recommended by your primary care doctor. Keep all scheduled follow-up appointments as recommended.  Comments: Prescriptions given at discharge.  Patient agreeable to plan.  Given opportunity to ask questions.  Appears to feel comfortable with discharge denies any current suicidal or homicidal thought. Patient is also instructed prior to discharge to: Take all medications as prescribed by his/her mental healthcare provider. Report any adverse effects and or reactions from the medicines to his/her outpatient provider promptly. Patient has been instructed & cautioned: To not engage in alcohol and or illegal drug use while on prescription medicines. In the event of worsening symptoms, patient is instructed to call the crisis hotline, 911 and or go to the nearest ED for appropriate evaluation and treatment of symptoms. To follow-up with his/her primary care provider for your other medical issues, concerns and or health care needs.  Signed: Lindell Spar, NP, PMHNP, FNP-BC 03/22/2020, 9:44 AM

## 2020-03-22 NOTE — Progress Notes (Signed)
RN met with pt and reviewed pt's discharge instructions.  Pt verbalized understanding of discharge instructions and pt did not have any questions. RN reviewed and provided pt with a copy of SRA, AVS and Transition Record.  RN returned pt's belongings to pt.  Prescriptions were given to pt.  RN escorted pt to taxi cab who was waiting to take pt to pt's father's house. 

## 2020-03-25 ENCOUNTER — Other Ambulatory Visit: Payer: Self-pay

## 2020-03-25 ENCOUNTER — Encounter (HOSPITAL_COMMUNITY): Payer: Self-pay | Admitting: Emergency Medicine

## 2020-03-25 ENCOUNTER — Emergency Department (HOSPITAL_COMMUNITY): Payer: Medicaid Other

## 2020-03-25 ENCOUNTER — Ambulatory Visit (HOSPITAL_COMMUNITY)
Admission: AD | Admit: 2020-03-25 | Discharge: 2020-03-25 | Disposition: A | Discharge status: Home / Self Care | Payer: Medicaid Other | Attending: Psychiatry | Admitting: Psychiatry

## 2020-03-25 ENCOUNTER — Emergency Department (HOSPITAL_COMMUNITY)
Admission: EM | Admit: 2020-03-25 | Discharge: 2020-03-27 | Disposition: A | Discharge status: Home / Self Care | Payer: Medicaid Other | Attending: Emergency Medicine | Admitting: Emergency Medicine

## 2020-03-25 DIAGNOSIS — F22 Delusional disorders: Secondary | ICD-10-CM | POA: Diagnosis not present

## 2020-03-25 DIAGNOSIS — R45851 Suicidal ideations: Secondary | ICD-10-CM | POA: Diagnosis not present

## 2020-03-25 DIAGNOSIS — F200 Paranoid schizophrenia: Secondary | ICD-10-CM

## 2020-03-25 DIAGNOSIS — F17210 Nicotine dependence, cigarettes, uncomplicated: Secondary | ICD-10-CM | POA: Diagnosis not present

## 2020-03-25 DIAGNOSIS — Z818 Family history of other mental and behavioral disorders: Secondary | ICD-10-CM | POA: Diagnosis not present

## 2020-03-25 DIAGNOSIS — R000 Tachycardia, unspecified: Secondary | ICD-10-CM | POA: Insufficient documentation

## 2020-03-25 DIAGNOSIS — F329 Major depressive disorder, single episode, unspecified: Secondary | ICD-10-CM | POA: Diagnosis not present

## 2020-03-25 DIAGNOSIS — Z20822 Contact with and (suspected) exposure to covid-19: Secondary | ICD-10-CM | POA: Insufficient documentation

## 2020-03-25 DIAGNOSIS — F209 Schizophrenia, unspecified: Secondary | ICD-10-CM | POA: Diagnosis present

## 2020-03-25 LAB — I-STAT BETA HCG BLOOD, ED (MC, WL, AP ONLY): I-stat hCG, quantitative: <5 m[IU]/mL (ref <5)

## 2020-03-25 LAB — COMPREHENSIVE METABOLIC PANEL
ALT: 16 U/L (ref 0–44)
AST: 19 U/L (ref 15–41)
Albumin: 4 g/dL (ref 3.5–5.0)
Alkaline Phosphatase: 77 U/L (ref 38–126)
Anion gap: 12 (ref 5–15)
BUN: 11 mg/dL (ref 6–20)
CO2: 22 mmol/L (ref 22–32)
Calcium: 9 mg/dL (ref 8.9–10.3)
Chloride: 106 mmol/L (ref 98–111)
Creatinine, Ser: 0.79 mg/dL (ref 0.44–1.00)
GFR calc Af Amer: >60 mL/min (ref >60)
GFR calc non Af Amer: >60 mL/min (ref >60)
Glucose, Bld: 99 mg/dL (ref 70–99)
Potassium: 3.7 mmol/L (ref 3.5–5.1)
Sodium: 140 mmol/L (ref 135–145)
Total Bilirubin: 0.4 mg/dL (ref 0.3–1.2)
Total Protein: 7.1 g/dL (ref 6.5–8.1)

## 2020-03-25 LAB — RAPID URINE DRUG SCREEN, HOSP PERFORMED
Amphetamines: NOT DETECTED
Barbiturates: NOT DETECTED
Benzodiazepines: NOT DETECTED
Cocaine: NOT DETECTED
Opiates: NOT DETECTED
Tetrahydrocannabinol: POSITIVE — AB

## 2020-03-25 LAB — CBC
HCT: 43.5 % (ref 36.0–46.0)
Hemoglobin: 13.9 g/dL (ref 12.0–15.0)
MCH: 29 pg (ref 26.0–34.0)
MCHC: 32 g/dL (ref 30.0–36.0)
MCV: 90.8 fL (ref 80.0–100.0)
Platelets: 253 10*3/uL (ref 150–400)
RBC: 4.79 MIL/uL (ref 3.87–5.11)
RDW: 15.2 % (ref 11.5–15.5)
WBC: 7.1 10*3/uL (ref 4.0–10.5)
nRBC: 0 % (ref 0.0–0.2)

## 2020-03-25 LAB — SARS CORONAVIRUS 2 BY RT PCR (HOSPITAL ORDER, PERFORMED IN ~~LOC~~ HOSPITAL LAB): SARS Coronavirus 2: NEGATIVE

## 2020-03-25 MED ORDER — HALOPERIDOL 5 MG PO TABS
10.0000 mg | ORAL_TABLET | Freq: Two times a day (BID) | ORAL | Status: DC
  Administered 2020-03-26 – 2020-03-27 (×3): 10 mg via ORAL
  Filled 2020-03-25 (×3): qty 2

## 2020-03-25 MED ORDER — FLUOXETINE HCL 20 MG PO CAPS
40.0000 mg | ORAL_CAPSULE | Freq: Every day | ORAL | Status: DC
  Administered 2020-03-26 – 2020-03-27 (×2): 40 mg via ORAL
  Filled 2020-03-25 (×2): qty 2

## 2020-03-25 MED ORDER — OLANZAPINE 5 MG PO TABS
5.0000 mg | ORAL_TABLET | Freq: Every day | ORAL | Status: DC
  Administered 2020-03-26 – 2020-03-27 (×2): 5 mg via ORAL
  Filled 2020-03-25 (×2): qty 1

## 2020-03-25 MED ORDER — PANTOPRAZOLE SODIUM 40 MG PO TBEC
40.0000 mg | DELAYED_RELEASE_TABLET | Freq: Every day | ORAL | Status: DC
  Administered 2020-03-26: 40 mg via ORAL
  Filled 2020-03-25: qty 1

## 2020-03-25 MED ORDER — DIVALPROEX SODIUM 500 MG PO DR TAB
1000.0000 mg | DELAYED_RELEASE_TABLET | Freq: Two times a day (BID) | ORAL | Status: DC
  Administered 2020-03-26 – 2020-03-27 (×3): 1000 mg via ORAL
  Filled 2020-03-25 (×3): qty 2

## 2020-03-25 MED ORDER — BENZTROPINE MESYLATE 1 MG PO TABS: 2.0000 mg | ORAL_TABLET | Freq: Two times a day (BID) | ORAL | Status: DC | PRN

## 2020-03-25 MED ORDER — NICOTINE 21 MG/24HR TD PT24: 21.0000 mg | MEDICATED_PATCH | Freq: Every day | TRANSDERMAL | Status: DC | PRN

## 2020-03-25 MED ORDER — TRAZODONE HCL 100 MG PO TABS
100.0000 mg | ORAL_TABLET | Freq: Every evening | ORAL | Status: DC | PRN
  Administered 2020-03-26: 100 mg via ORAL
  Filled 2020-03-25: qty 1

## 2020-03-25 MED ORDER — HYDROXYZINE HCL 25 MG PO TABS: 50.0000 mg | ORAL_TABLET | Freq: Three times a day (TID) | ORAL | Status: DC | PRN

## 2020-03-25 MED ORDER — PROPRANOLOL HCL 20 MG PO TABS
10.0000 mg | ORAL_TABLET | Freq: Three times a day (TID) | ORAL | Status: DC
  Administered 2020-03-26 – 2020-03-27 (×4): 10 mg via ORAL
  Filled 2020-03-25 (×4): qty 1

## 2020-03-25 NOTE — ED Notes (Signed)
Pink underarmor backpack and one pt belonging bag placed at nurses station. Pt wanded by security.

## 2020-03-25 NOTE — BH Assessment (Signed)
Case was staffed with Reita Cliche DNP who recommended patient be sent to [facility] for medical clearance and requires a inpatient admission for stabilization.

## 2020-03-25 NOTE — ED Provider Notes (Signed)
[facility] DEPT Provider Note   CSN: [id number] Arrival date & time: 03/25/20  1610     History Chief Complaint  Patient presents with  . Suicidal    Julia [name] is a 19 y.o. female with a past medical history of schizophrenia, paranoia, bipolar one, polysubstance abuse, depression, who presents today for evaluation of suicidal with a vague plan, delusions and paranoia.  She was originally seen at behavioral health today where she was reportedly meeting inpatient criteria per Dr.Maurer.    Patient states to me that the celebrity Irving Shows and [employer]KJ[employer], are going to send people to kill her.  She reports that she has met someone who knows them and she is afraid they will try to kill her.   She reports that since she was discharged she doesn't know if she has been taking her medications.  She states that she has thoughts put into her head by the antichrist.  She states that the antichrist puts these thoughts of wanting to harm herself and do her head.  He also reportedly occasionally put homicidal ideations into her head, however is not doing that currently.    She reports that her chest has been tight.  She states that this is her anxiety, happens every time she gets anxious and denies concerns for other etiology.  She has had issues with living situation currently.   Level five caveat for psychiatric condition  HPI     Past Medical History:  Diagnosis Date  . Burning with urination 05/03/2015  . Contraceptive management 05/03/2015  . Depression   . Dysmenorrhea 12/30/2013  . Heroin addiction ([place])   . Menorrhagia 12/30/2013  . Menstrual extraction 12/30/2013  . Migraines   . Social anxiety disorder 09/25/2016  . Suicidal ideations   . Vaginal odor 05/03/2015    Patient Active Problem List   Diagnosis Date Noted  . Schizophrenia ([place]) 03/16/2020  . MDD (major depressive disorder) 10/15/2019  . Schizophrenia, paranoid ([place])   . Mood disorder with  psychosis ([place]) 05/16/2019  . Cocaine dependence with cocaine-induced psychotic disorder with complication ([place])   . Psychosis ([place]) 05/15/2019  . Bipolar 1 disorder, depressed, severe ([place]) 01/06/2018  . Polysubstance dependence including opioid type drug, continuous use ([place]) 01/06/2018  . Petechiae 10/09/2017  . Social anxiety disorder 09/25/2016  . MDD (major depressive disorder), recurrent episode ([place]) 09/24/2016  . Substance induced mood disorder ([place]) 04/29/2016  . Depressed mood 04/17/2016  . Polysubstance abuse ([place])   . Recurrent UTI (urinary tract infection) postcoital 08/21/2015  . Dysmenorrhea 12/30/2013    Past Surgical History:  Procedure Laterality Date  . NO PAST SURGERIES       OB History    Gravida  0   Para      Term      Preterm      AB      Living        SAB      TAB      Ectopic      Multiple      Live Births              Family History  Problem Relation Age of Onset  . Depression Mother   . Hypertension Father   . Hyperlipidemia Father   . Cancer Paternal Grandmother        breast, uterine  . Cirrhosis Paternal Grandfather        due to alcohol    Social History  Tobacco Use  . Smoking status: Current Every Day Smoker    Packs/day: 1.00    Types: Cigarettes  . Smokeless tobacco: Never Used  . Tobacco comment: Not ready to quit  Vaping Use  . Vaping Use: Never used  Substance Use Topics  . Alcohol use: Yes    Comment: BAC was clear  . Drug use: Yes    Types: Marijuana, Oxycodone    Comment: reports oxycodone and marijuana    Home Medications Prior to Admission medications   Medication Sig Start Date End Date Taking? Authorizing Provider  benztropine (COGENTIN) 2 MG tablet Take 1 tablet (2 mg total) by mouth 2 (two) times daily as needed for tremors (EPS). 03/22/20   Lindell Spar I, NP  divalproex (DEPAKOTE) 500 MG DR tablet Take 2 tablets (1,000 mg total) by mouth 2 (two) times daily. For mood stabilization  03/22/20   Lindell Spar I, NP  FLUoxetine (PROZAC) 40 MG capsule Take 1 capsule (40 mg total) by mouth daily. For depression 03/22/20   Lindell Spar I, NP  haloperidol (HALDOL) 10 MG tablet Take 1 tablet (10 mg total) by mouth 2 (two) times daily. For mood control 03/22/20   Lindell Spar I, NP  haloperidol (HALDOL) 5 MG tablet Take 3 tablets (15 mg total) by mouth 2 (two) times daily. 03/22/20   Sharma Covert, MD  hydrOXYzine (ATARAX/VISTARIL) 50 MG tablet Take 1 tablet (50 mg total) by mouth 3 (three) times daily as needed for anxiety. 03/22/20   Lindell Spar I, NP  nicotine polacrilex (NICORETTE) 2 MG gum Take 1 each (2 mg total) by mouth as needed. (May buy from over the counter): For smoking cessation 03/22/20   Lindell Spar I, NP  OLANZapine (ZYPREXA) 5 MG tablet Take 1 tablet (5 mg total) by mouth daily. For mood control 03/22/20   Lindell Spar I, NP  OLANZapine zydis (ZYPREXA) 15 MG disintegrating tablet Take 1 tablet (15 mg total) by mouth at bedtime. For mood control 03/22/20   Lindell Spar I, NP  pantoprazole (PROTONIX) 40 MG tablet Take 1 tablet (40 mg total) by mouth at bedtime. For acid rflux 03/22/20   Lindell Spar I, NP  propranolol (INDERAL) 10 MG tablet Take 1 tablet (10 mg total) by mouth 3 (three) times daily. For anxiety 03/22/20   Lindell Spar I, NP  traZODone (DESYREL) 100 MG tablet Take 1 tablet (100 mg total) by mouth at bedtime as needed for sleep. 03/22/20   Lindell Spar I, NP    Allergies    Abilify [aripiprazole] and Zoloft [sertraline hcl]  Review of Systems   Review of Systems Level five caveat for psychiatric condition Physical Exam Updated Vital Signs BP 119/83 (BP Location: Left Arm)   Pulse 96   Temp 98.4 F (36.9 C) (Oral)   Resp 18   SpO2 98%   Physical Exam Vitals and nursing note reviewed.  Constitutional:      Appearance: She is well-developed. She is not diaphoretic.     Comments: Anxious appearing.  HENT:     Head: Normocephalic and atraumatic.  Eyes:      General: No scleral icterus.       Right eye: No discharge.        Left eye: No discharge.     Conjunctiva/sclera: Conjunctivae normal.  Cardiovascular:     Rate and Rhythm: Regular rhythm. Tachycardia present.     Heart sounds: Normal heart sounds.  Pulmonary:     Effort: Pulmonary effort  is normal. No respiratory distress.     Breath sounds: Normal breath sounds. No stridor.  Abdominal:     General: Bowel sounds are normal. There is no distension.     Tenderness: There is no abdominal tenderness. There is no guarding.  Musculoskeletal:        General: No deformity.     Cervical back: Normal range of motion and neck supple.  Skin:    General: Skin is warm and dry.  Neurological:     Mental Status: She is alert.     Motor: No abnormal muscle tone.     Comments: Patient is awake and alert.  Her speech is not slurred.  Spontaneous movement of bilateral arms and legs.  Psychiatric:        Mood and Affect: Mood is anxious. Affect is labile and flat.        Speech: Speech normal.        Behavior: Behavior normal. Behavior is cooperative.        Thought Content: Thought content is paranoid and delusional. Thought content includes suicidal ideation. Thought content does not include homicidal ideation. Thought content includes suicidal plan.        Cognition and Memory: Cognition is impaired.        Judgment: Judgment is impulsive.     ED Results / Procedures / Treatments   Labs (all labs ordered are listed, but only abnormal results are displayed) Labs Reviewed  SARS CORONAVIRUS 2 BY RT PCR ([facility])  ETHANOL  RAPID URINE DRUG SCREEN, HOSP PERFORMED  CBC  COMPREHENSIVE METABOLIC PANEL  I-STAT BETA HCG BLOOD, ED ([place], [facility], [facility] ONLY)    EKG EKG Interpretation  Date/Time:  Saturday March 25 2020 17:14:53 EDT Ventricular Rate:  99 PR Interval:    QRS Duration: 87 QT Interval:  335 QTC Calculation: 430 R Axis:   54 Text  Interpretation: Sinus rhythm Since last tracing rate slower Confirmed by [name] [id number]) on 03/25/2020 5:22:18 PM   Radiology DG Chest 2 View  Result Date: 03/25/2020 CLINICAL DATA:  Chest tightness EXAM: CHEST - 2 VIEW COMPARISON:  10/25/2011 FINDINGS: The heart size and mediastinal contours are within normal limits. Both lungs are clear. The visualized skeletal structures are unremarkable. IMPRESSION: No active cardiopulmonary disease. Electronically Signed   By: Constance Holster M.D.   On: 03/25/2020 17:47    Procedures Procedures (including critical care time)  Medications Ordered in ED Medications - No data to display  ED Course  I have reviewed the triage vital signs and the nursing notes.  Pertinent labs & imaging results that were available during my care of the patient were reviewed by me and considered in my medical decision making (see chart for details).    MDM Rules/Calculators/A&P                         Patient is a 19 year old woman who presents today for medical clearance.  She was originally seen at [facility] where she was felt to meet inpatient criteria.  She was recently discharged from [facility].  Patient reports significant paranoia and delusions, believing that to well-known celebrities are sending people to kill her despite never having met these people.  Additionally she reports that the "antichrist" thoughts into her head about her wanting to harm herself or others.  She did report chest tightness, which she attributes to her anxiety.  EKG was ordered  showing no ischemia.  X-ray is reassuring without pneumothorax or consolidation or cause for symptoms found.  Labs obtained and reviewed CBC and CMP are unremarkable.  Pregnancy test is negative.  Covid test is negative.  UDS is positive for THC.  Reviewed discharge instructions from her recent admission and reordered her home medications.  Patient is medically clear for psychiatric evaluation and disposition.  She had  been evaluated at [facility] prior to her arrival here and was found to meet inpatient criteria.  Anticipate inpatient admission.  If patient was to attempt to leave, based on her hallucinations and delusions at this time I would recommend IVC.  Note: Portions of this report may have been transcribed using voice recognition software. Every effort was made to ensure accuracy; however, inadvertent computerized transcription errors may be present   Final Clinical Impression(s) / ED Diagnoses Final diagnoses:  Suicidal thoughts  Schizophrenia, paranoid [facility])    Rx / [place] Orders ED Discharge Orders    None       Ollen Gross 03/25/20 2143    [name], MD 03/26/20 1500

## 2020-03-25 NOTE — H&P (Signed)
Behavioral Health Medical Screening Exam  Julia [name] is an 19 y.o. female.  Total Time spent with patient: 45 minutes  Psychiatric Specialty Exam: Physical Exam Vitals and nursing note reviewed.  Constitutional:      Appearance: Normal appearance.  HENT:     Head: Normocephalic.     Nose: Nose normal.  Cardiovascular:     Rate and Rhythm: Normal rate.  Pulmonary:     Effort: Pulmonary effort is normal.  Musculoskeletal:        General: Normal range of motion.     Cervical back: Normal range of motion.  Neurological:     General: No focal deficit present.     Mental Status: She is alert and oriented to person, place, and time.    Review of Systems  Psychiatric/Behavioral: Positive for behavioral problems, hallucinations and suicidal ideas. The patient is nervous/anxious.   All other systems reviewed and are negative.  There were no vitals taken for this visit.There is no height or weight on file to calculate BMI. General Appearance: Casual Eye Contact:  Fair Speech:  Normal Rate Volume:  Normal Mood:  Anxious, Depressed and Irritable Affect:  Blunt Thought Process:  Coherent and Descriptions of Associations: Intact Orientation:  Full (Time, Place, and Person) Thought Content:  Hallucinations: Auditory Visual and Paranoid Ideation Suicidal Thoughts:  Yes.  without intent/plan Homicidal Thoughts:  No Memory:  Immediate;   Fair Recent;   Fair Remote;   Fair Judgement:  Poor Insight:  Fair Psychomotor Activity:  Normal Concentration: Concentration: Fair and Attention Span: Fair Recall:  [place]: Fair Akathisia:  No Handed:  Right AIMS (if indicated):    Assets:  Leisure Time Physical Health Resilience Social Support Sleep:     Musculoskeletal: Strength & Muscle Tone: within normal limits Gait & Station: normal Patient leans: N/A  There were no vitals taken for this visit.  Recommendations: Based on my evaluation the  patient does not appear to have an emergency medical condition.  Waylan Boga, NP 03/25/2020, 4:06 PM

## 2020-03-25 NOTE — ED Notes (Signed)
Pt given burgundy scrubs and pt belonging bags and instructed to change into scrubs. Will check back to remove the remaining belongings from room.

## 2020-03-25 NOTE — ED Triage Notes (Signed)
Patient here from [facility] for medical clearance. Suicidal with vague plan. Keeps repeating "they are going to kill me".

## 2020-03-25 NOTE — ED Notes (Signed)
Provided pt with sandwich and water.  °

## 2020-03-25 NOTE — BH Assessment (Signed)
Assessment Note  Julia [name] is an 19 y.o. female that presents this date at [facility] as a walk in. Patient is brought in by her father who states the patient had contacted him earlier this date from a local motel requesting to be transported to [facility] for a mental health evaluation. Patient reports ongoing S/I this date although is vague in reference to a plan. Patient is observed to be very agitated and liable as she interacts with this Probation officer. Patient will not respond when asked about H/I. Patient states "she has thought about it" although will not elaborate on content. Patient is unable to participate in the assessment due to current AMS. Patient is observed to be very tearful and difficult to redirect. Patient reports ongoing AVH although again will not elaborate on content. Patient keeps repeating, "They are going to kill me." Patient is unable to participate in the assessment. Patient is familiar to area providers and was discharged from [facility] on 03/17/20. Patient's father this date states patient is currently homeless and has not been compliant with her medication regimen since her discharge. Patient denies any SA use this date although has a history of cannabis use with UDS pending this date. Patient has had multiple hospitalizations both at [facility] behavioral health as well as [facility] who last presented to the [facility] on 03/16/2020 secondary to altered mental status. The patient had been brought to the behavioral health center on that date by law enforcement and was admitted to [facility] being discharged on 03/22/20. This date patient is noted to be disorganized and responding to internal stimuli as evidenced by patient talking to herself while she was in the evaluation room. Patient also came out of that room and sat down in the hallway screaming that someone was after her.She has a longstanding history of psychosis thought to be schizophrenia. She has been followed  by an [place] although due to her transient nature she is difficult to locate at times. Patient per notes has been difficult to stabilize while in the community.  Patient will not answer orientation questions this date. Patient is observed to be disorganized and paranoid. Patient is responding to active AVH and is difficult to redirect. Case was staffed with Reita Cliche DNP who recommended patient be sent to [facility] for medical clearance and requires a inpatient admission for stabilization.        Diagnosis: Schizophrenia (per notes) Cannabis use   Past Medical History:  Past Medical History:  Diagnosis Date  . Burning with urination 05/03/2015  . Contraceptive management 05/03/2015  . Depression   . Dysmenorrhea 12/30/2013  . Heroin addiction ([place])   . Menorrhagia 12/30/2013  . Menstrual extraction 12/30/2013  . Migraines   . Social anxiety disorder 09/25/2016  . Suicidal ideations   . Vaginal odor 05/03/2015    Past Surgical History:  Procedure Laterality Date  . NO PAST SURGERIES      Family History:  Family History  Problem Relation Age of Onset  . Depression Mother   . Hypertension Father   . Hyperlipidemia Father   . Cancer Paternal Grandmother        breast, uterine  . Cirrhosis Paternal Grandfather        due to alcohol    Social History:  reports that she has been smoking cigarettes. She has been smoking about 1.00 pack per day. She has never used smokeless tobacco. She reports current alcohol use. She reports current drug use.  Drugs: Marijuana and Oxycodone.  Additional Social History:     CIWA:   COWS:    Allergies:  Allergies  Allergen Reactions  . Abilify [Aripiprazole]   . Zoloft [Sertraline Hcl]     Home Medications: (Not in a hospital admission)   OB/GYN Status:  No LMP recorded. Patient has had an implant.                                                             Disposition: Case was staffed with  Reita Cliche DNP who recommended patient be sent to [facility] for medical clearance and requires a inpatient admission for stabilization.        On Site Evaluation by:   Reviewed with Physician:    Mamie Nick 03/25/2020 4:18 PM

## 2020-03-25 NOTE — ED Notes (Signed)
Pt responding to auditory hallucinations.  Yelling is heard from pt's room at the nurses station. Pt yelling for them to leave her alone.

## 2020-03-26 DIAGNOSIS — F200 Paranoid schizophrenia: Secondary | ICD-10-CM

## 2020-03-26 MED ORDER — BENZTROPINE MESYLATE 1 MG PO TABS
1.0000 mg | ORAL_TABLET | Freq: Two times a day (BID) | ORAL | Status: DC
  Administered 2020-03-26 – 2020-03-27 (×2): 1 mg via ORAL
  Filled 2020-03-26 (×2): qty 1

## 2020-03-26 MED ORDER — HYDROXYZINE HCL 25 MG PO TABS
25.0000 mg | ORAL_TABLET | Freq: Four times a day (QID) | ORAL | Status: DC | PRN
  Administered 2020-03-26: 25 mg via ORAL
  Filled 2020-03-26: qty 1

## 2020-03-26 NOTE — ED Provider Notes (Signed)
Emergency Medicine Observation Re-evaluation Note  Julia [name] is a 19 y.o. female, seen on rounds today.  Pt initially presented to the ED for complaints of Suicidal Currently, the patient is sleeping soundly  Physical Exam  BP 103/63 (BP Location: Right Arm)   Pulse 86   Temp 98.1 F (36.7 C) (Oral)   Resp 16   SpO2 97%  Physical Exam General: Resting comfortably Lungs: No distress Psych: Calm and cooperative  ED Course / MDM  EKG:EKG Interpretation  Date/Time:  Saturday March 25 2020 17:14:53 EDT Ventricular Rate:  99 PR Interval:    QRS Duration: 87 QT Interval:  335 QTC Calculation: 430 R Axis:   54 Text Interpretation: Sinus rhythm Since last tracing rate slower Confirmed by [name] [phone]) on 03/25/2020 5:22:18 PM    I have reviewed the labs performed to date as well as medications administered while in observation.  Recent changes in the last 24 hours include none.  Plan  Current plan is for Psych re-eval this AM. . Patient is not under full IVC at this time.   Truddie Hidden, MD 03/26/20 0800

## 2020-03-26 NOTE — Consult Note (Addendum)
[place] Psychiatry Consult   Reason for Consult:  Suicidal ideations  Referring Physician:  EDP Patient Identification: Julia [name] MRN:  [id number] Principal Diagnosis: Schizophrenia, paranoid [facility]) Diagnosis:  Principal Problem:   Schizophrenia, paranoid ([place])   Total Time spent with patient:30 minutes  Subjective:   Julia [name] is a 19 y.o. female patient admitted with suicidal ideation, brought in by her family. Family states that patient is homeless at this time. Patient states that she is feeling "okay" this morning and reports that she slept well overnight. She states that her appetite has been "okay", but states that her "stomach was upset this morning and didn't eat much." Patient endorses continued thoughts of suicidal ideation, but denies specific plan at this time. She states that she has lived in a group home situation in the past, which was "not good as the other residents all ganged up on me". She denies any current homicidal ideations or AVH at this time. Patient reports that Prozac has been the only medication that has been helpful to her in the past.   HPI per [facility] Specialist: [name] is an 19 y.o. female that presents this date at [facility] as a walk in. Patient is brought in by her father who states the patient had contacted him earlier this date from a local motel requesting to be transported to [facility] for a mental health evaluation. Patient reports ongoing S/I this date although is vague in reference to a plan. Patient is observed to be very agitated and liable as she interacts with this Probation officer. Patient will not respond when asked about H/I. Patient states "she has thought about it" although will not elaborate on content. Patient is unable to participate in the assessment due to current AMS. Patient is observed to be very tearful and difficult to redirect. Patient reports ongoing AVH although again will not elaborate on content. Patient keeps repeating, "They are going  to kill me." Patient is unable to participate in the assessment. Patient is familiar to area providers and was discharged from [facility] on 03/17/20. Patient's father this date states patient is currently homeless and has not been compliant with her medication regimen since her discharge. Patient denies any SA use this date although has a history of cannabis use with UDS pending this date. Patient has had multiple hospitalizations both at [facility] behavioral health as well as [facility] who last presented to the [facility] on 03/16/2020 secondary to altered mental status. The patient had been brought to the behavioral health center on that date by law enforcement and was admitted to [facility] being discharged on 03/22/20. This date patient is noted to be disorganized and responding to internal stimuli as evidenced by patient talking to herself while she was in the evaluation room. Patient also came out of that room and sat down in the hallway screaming that someone was after her.She has a longstanding history of psychosis thought to be schizophrenia. She has been followed by an [place] although due to her transient nature she is difficult to locate at times. Patient per notes has been difficult to stabilize while in the community.   Past Psychiatric History: psychosis, substance use disorder, suicidal ideations, depression    Risk to Self:   Risk to Others:   Prior Inpatient Therapy:   Prior Outpatient Therapy:    Past Medical History:  Past Medical History:  Diagnosis Date  . Burning with urination 05/03/2015  . Contraceptive  management 05/03/2015  . Depression   . Dysmenorrhea 12/30/2013  . Heroin addiction ([place])   . Menorrhagia 12/30/2013  . Menstrual extraction 12/30/2013  . Migraines   . Social anxiety disorder 09/25/2016  . Suicidal ideations   . Vaginal odor 05/03/2015    Past Surgical History:  Procedure Laterality Date  . NO PAST  SURGERIES     Family History:  Family History  Problem Relation Age of Onset  . Depression Mother   . Hypertension Father   . Hyperlipidemia Father   . Cancer Paternal Grandmother        breast, uterine  . Cirrhosis Paternal Grandfather        due to alcohol   Family Psychiatric  History: unknown Social History:  Social History   Substance and Sexual Activity  Alcohol Use Yes   Comment: BAC was clear     Social History   Substance and Sexual Activity  Drug Use Yes  . Types: Marijuana, Oxycodone   Comment: reports oxycodone and marijuana    Social History   Socioeconomic History  . Marital status: Single    Spouse name: Not on file  . Number of children: Not on file  . Years of education: Not on file  . Highest education level: Not on file  Occupational History  . Occupation: Unemployed  Tobacco Use  . Smoking status: Current Every Day Smoker    Packs/day: 1.00    Types: Cigarettes  . Smokeless tobacco: Never Used  . Tobacco comment: Not ready to quit  Vaping Use  . Vaping Use: Never used  Substance and Sexual Activity  . Alcohol use: Yes    Comment: BAC was clear  . Drug use: Yes    Types: Marijuana, Oxycodone    Comment: reports oxycodone and marijuana  . Sexual activity: Yes    Birth control/protection: Implant  Other Topics Concern  . Not on file  Social History Narrative   06/23/2019:  Pt stated that she is homeless, that she is a high school graduate, and that she is unemployed and not followed by any outpatient provider.      Lives with Dad. Mom has LGD, lives in [place]. 11th grader. Dog.   Social Determinants of Health   Financial Resource Strain:   . Difficulty of Paying Living Expenses: Not on file  Food Insecurity:   . Worried About Charity fundraiser in the Last Year: Not on file  . Ran Out of Food in the Last Year: Not on file  Transportation Needs:   . Lack of Transportation (Medical): Not on file  . Lack of Transportation (Non-Medical):  Not on file  Physical Activity:   . Days of Exercise per Week: Not on file  . Minutes of Exercise per Session: Not on file  Stress:   . Feeling of Stress : Not on file  Social Connections:   . Frequency of Communication with Friends and Family: Not on file  . Frequency of Social Gatherings with Friends and Family: Not on file  . Attends Religious Services: Not on file  . Active Member of Clubs or Organizations: Not on file  . Attends Archivist Meetings: Not on file  . Marital Status: Not on file   Additional Social History:    Allergies:   Allergies  Allergen Reactions  . Abilify [Aripiprazole]   . Zoloft [Sertraline Hcl]     Labs:  Results for orders placed or performed during the hospital encounter of 03/25/20 (  from the past 48 hour(s))  Urine rapid drug screen (hosp performed)     Status: Abnormal   Collection Time: 03/25/20  5:32 PM  Result Value Ref Range   Opiates NONE DETECTED NONE DETECTED   Cocaine NONE DETECTED NONE DETECTED   Benzodiazepines NONE DETECTED NONE DETECTED   Amphetamines NONE DETECTED NONE DETECTED   Tetrahydrocannabinol POSITIVE (A) NONE DETECTED   Barbiturates NONE DETECTED NONE DETECTED    Comment: (NOTE) DRUG SCREEN FOR MEDICAL PURPOSES ONLY.  IF CONFIRMATION IS NEEDED FOR ANY PURPOSE, NOTIFY LAB WITHIN 5 DAYS.  LOWEST DETECTABLE LIMITS FOR URINE DRUG SCREEN Drug Class                     Cutoff (ng/mL) Amphetamine and metabolites    1000 Barbiturate and metabolites    200 Benzodiazepine                 902 Tricyclics and metabolites     300 Opiates and metabolites        300 Cocaine and metabolites        300 THC                            50 Performed at [facility], [place] [address] [place], [place] [address]   CBC     Status: None   Collection Time: 03/25/20  5:56 PM  Result Value Ref Range   WBC 7.1 4.0 - 10.5 K/uL   RBC 4.79 3.87 - 5.11 MIL/uL   Hemoglobin 13.9 12.0 - 15.0 g/dL   HCT 43.5 36 - 46 %   MCV  90.8 80.0 - 100.0 fL   MCH 29.0 26.0 - 34.0 pg   MCHC 32.0 30.0 - 36.0 g/dL   RDW 15.2 11.5 - 15.5 %   Platelets 253 150 - 400 K/uL   nRBC 0.0 0.0 - 0.2 %    Comment: Performed at [facility], [place] [address], [place], [place] [address]  Comprehensive metabolic panel     Status: None   Collection Time: 03/25/20  5:56 PM  Result Value Ref Range   Sodium 140 135 - 145 mmol/L   Potassium 3.7 3.5 - 5.1 mmol/L   Chloride 106 98 - 111 mmol/L   CO2 22 22 - 32 mmol/L   Glucose, Bld 99 70 - 99 mg/dL    Comment: Glucose reference range applies only to samples taken after fasting for at least 8 hours.   BUN 11 6 - 20 mg/dL   Creatinine, Ser 0.79 0.44 - 1.00 mg/dL   Calcium 9.0 8.9 - 10.3 mg/dL   Total Protein 7.1 6.5 - 8.1 g/dL   Albumin 4.0 3.5 - 5.0 g/dL   AST 19 15 - 41 U/L   ALT 16 0 - 44 U/L   Alkaline Phosphatase 77 38 - 126 U/L   Total Bilirubin 0.4 0.3 - 1.2 mg/dL   GFR calc non Af Amer >60 >60 mL/min   GFR calc Af Amer >60 >60 mL/min   Anion gap 12 5 - 15    Comment: Performed at [facility], [place] [address], [place], [place] [address]  I-Stat beta hCG blood, ED     Status: None   Collection Time: 03/25/20  6:00 PM  Result Value Ref Range   I-stat hCG, quantitative <5.0 <5 mIU/mL   Comment 3  Comment:   GEST. AGE      CONC.  (mIU/mL)   <=1 WEEK        5 - 50     2 WEEKS       50 - 500     3 WEEKS       100 - 10,000     4 WEEKS     1,000 - 30,000        FEMALE AND NON-PREGNANT FEMALE:     LESS THAN 5 mIU/mL   SARS Coronavirus 2 by RT PCR (hospital order, performed in [place] hospital lab) Nasopharyngeal Nasopharyngeal Swab     Status: None   Collection Time: 03/25/20  7:35 PM   Specimen: Nasopharyngeal Swab  Result Value Ref Range   SARS Coronavirus 2 NEGATIVE NEGATIVE    Comment: (NOTE) SARS-CoV-2 target nucleic acids are NOT DETECTED.  The SARS-CoV-2 RNA is generally detectable in upper and lower respiratory specimens during the  acute phase of infection. The lowest concentration of SARS-CoV-2 viral copies this assay can detect is 250 copies / mL. A negative result does not preclude SARS-CoV-2 infection and should not be used as the sole basis for treatment or other patient management decisions.  A negative result may occur with improper specimen collection / handling, submission of specimen other than nasopharyngeal swab, presence of viral mutation(s) within the areas targeted by this assay, and inadequate number of viral copies (<250 copies / mL). A negative result must be combined with clinical observations, patient history, and epidemiological information.  Fact Sheet for Patients:   StrictlyIdeas.no  Fact Sheet for Healthcare Providers: BankingDealers.co.za  This test is not yet approved or  cleared by the [place] FDA and has been authorized for detection and/or diagnosis of SARS-CoV-2 by FDA under an Emergency Use Authorization (EUA).  This EUA will remain in effect (meaning this test can be used) for the duration of the COVID-19 declaration under Section 564(b)(1) of the Act, 21 U.S.C. section 360bbb-3(b)(1), unless the authorization is terminated or revoked sooner.  Performed at [facility], [place] [address] [place], [place] [address]     Current Facility-Administered Medications  Medication Dose Route Frequency Provider Last Rate Last Admin  . benztropine (COGENTIN) tablet 2 mg  2 mg Oral BID PRN Lorin Glass, PA-C      . divalproex (DEPAKOTE) DR tablet 1,000 mg  1,000 mg Oral BID Lorin Glass, PA-C   1,000 mg at 03/26/20 0946  . FLUoxetine (PROZAC) capsule 40 mg  40 mg Oral Daily Lorin Glass, [place]   40 mg at 03/26/20 0946  . haloperidol (HALDOL) tablet 10 mg  10 mg Oral BID Lorin Glass, PA-C   10 mg at 03/26/20 7903  . hydrOXYzine (ATARAX/VISTARIL) tablet 50 mg  50 mg Oral TID PRN Lorin Glass, PA-C      . nicotine (NICODERM CQ - dosed in mg/24 hours) patch 21 mg  21 mg Transdermal Daily PRN Lorin Glass, PA-C      . OLANZapine (ZYPREXA) tablet 5 mg  5 mg Oral Daily Lorin Glass, PA-C   5 mg at 03/26/20 0946  . pantoprazole (PROTONIX) EC tablet 40 mg  40 mg Oral QHS Wyn Quaker W, PA-C      . propranolol (INDERAL) tablet 10 mg  10 mg Oral TID Lorin Glass, PA-C   10 mg at 03/26/20 1552  . traZODone (DESYREL) tablet 100 mg  100 mg Oral  QHS PRN Lorin Glass, PA-C       Current Outpatient Medications  Medication Sig Dispense Refill  . benztropine (COGENTIN) 2 MG tablet Take 1 tablet (2 mg total) by mouth 2 (two) times daily as needed for tremors (EPS). 60 tablet 0  . divalproex (DEPAKOTE) 500 MG DR tablet Take 2 tablets (1,000 mg total) by mouth 2 (two) times daily. For mood stabilization 120 tablet 0  . FLUoxetine (PROZAC) 40 MG capsule Take 1 capsule (40 mg total) by mouth daily. For depression 30 capsule 0  . haloperidol (HALDOL) 10 MG tablet Take 1 tablet (10 mg total) by mouth 2 (two) times daily. For mood control 60 tablet 0  . haloperidol (HALDOL) 5 MG tablet Take 3 tablets (15 mg total) by mouth 2 (two) times daily. 90 tablet 0  . hydrOXYzine (ATARAX/VISTARIL) 50 MG tablet Take 1 tablet (50 mg total) by mouth 3 (three) times daily as needed for anxiety. 75 tablet 0  . nicotine polacrilex (NICORETTE) 2 MG gum Take 1 each (2 mg total) by mouth as needed. (May buy from over the counter): For smoking cessation 100 tablet 0  . OLANZapine (ZYPREXA) 5 MG tablet Take 1 tablet (5 mg total) by mouth daily. For mood control 30 tablet 0  . OLANZapine zydis (ZYPREXA) 15 MG disintegrating tablet Take 1 tablet (15 mg total) by mouth at bedtime. For mood control 30 tablet 0  . pantoprazole (PROTONIX) 40 MG tablet Take 1 tablet (40 mg total) by mouth at bedtime. For acid rflux 30 tablet 0  . propranolol (INDERAL) 10 MG tablet Take 1 tablet (10 mg  total) by mouth 3 (three) times daily. For anxiety 45 tablet 1  . traZODone (DESYREL) 100 MG tablet Take 1 tablet (100 mg total) by mouth at bedtime as needed for sleep. 30 tablet 0    Musculoskeletal: Strength & Muscle Tone: within normal limits Gait & Station: normal Patient leans: N/A  Psychiatric Specialty Exam: Physical Exam Vitals and nursing note reviewed.  Constitutional:      Appearance: Normal appearance.  HENT:     Head: Normocephalic.     Nose: Nose normal.  Pulmonary:     Effort: Pulmonary effort is normal.  Musculoskeletal:        General: Normal range of motion.     Cervical back: Normal range of motion.  Neurological:     General: No focal deficit present.     Mental Status: She is alert and oriented to person, place, and time.  Psychiatric:        Attention and Perception: Attention and perception normal. She does not perceive auditory or visual hallucinations.        Mood and Affect: Mood is anxious and depressed. Affect is flat.        Speech: Speech normal.        Behavior: Behavior is cooperative.        Thought Content: Thought content includes suicidal ideation. Thought content includes suicidal plan.        Cognition and Memory: Cognition and memory normal.        Judgment: Judgment is impulsive.     Review of Systems  Psychiatric/Behavioral: Positive for dysphoric mood and suicidal ideas. The patient is nervous/anxious.   All other systems reviewed and are negative.   Blood pressure 100/65, pulse 76, temperature (!) 97.4 F (36.3 C), resp. rate 18, SpO2 97 %.There is no height or weight on file to calculate BMI.  General Appearance:  Casual  Eye Contact:  Good  Speech:  Normal Rate  Volume:  Normal  Mood:  Anxious and Depressed  Affect:  Congruent  Thought Process:  Coherent  Orientation:  Full (Time, Place, and Person)  Thought Content:  Logical  Suicidal Thoughts:  Yes.  with intent/plan  Homicidal Thoughts:  No  Memory:  Immediate;    Fair Recent;   Fair Remote;   Fair  Judgement:  Impaired  Insight:  Lacking  Psychomotor Activity:  Normal  Concentration:  Concentration: Good and Attention Span: Good  Recall:  Good  Fund of Knowledge:  Fair  Language:  Fair  Akathisia:  No  Handed:  Right  AIMS (if indicated):     Assets:  Communication Skills  ADL's:  Intact  Cognition:  WNL  Sleep:   patient states that she "slept fine last night"     Treatment Plan Summary: Daily contact with patient to assess and evaluate symptoms and progress in treatment  Schizophrenia, paranoid type: -Continue Haldol 10 mg BID -Continue Zyprexa 5 mg daily  -Continue Depakote 1000 mg BID  Depression: -Continue Prozac 40 mg daily  EPS: -Cogentin 2 mg BID changed to 1 mg BID  Anxiety: -Continue propranolol 10 mg TID   Insomnia: -Continue Trazodone 100 mg at bedtime PRN  Disposition: Recommend psychiatric Inpatient admission when medically cleared.  Waylan Boga, NP 03/26/2020 4:09 PM  Patient seen face-to-face for psychiatric evaluation, chart reviewed and case discussed with the physician extender and developed treatment plan. Reviewed the information documented and agree with the treatment plan. Corena Pilgrim, MD

## 2020-03-26 NOTE — ED Notes (Signed)
DINNER TRAY GIVEN °

## 2020-03-26 NOTE — ED Notes (Addendum)
CURRENTLY HAVING A [place] ON THE PHONE CRYING

## 2020-03-26 NOTE — ED Notes (Signed)
PT IS TAKING A SHOWER

## 2020-03-26 NOTE — ED Notes (Signed)
PT BREAKFAST TRAY WAS GIVEN

## 2020-03-26 NOTE — ED Notes (Signed)
Pt has been calm for shift. Pt became upset, yelling and crying. Pt was reassured and talk to family. Pt has calmed down.

## 2020-03-26 NOTE — ED Notes (Signed)
LUNCH TRAY GIVEN. 

## 2020-03-27 ENCOUNTER — Encounter (HOSPITAL_COMMUNITY): Payer: Self-pay

## 2020-03-27 ENCOUNTER — Ambulatory Visit (HOSPITAL_COMMUNITY)
Admission: EM | Admit: 2020-03-27 | Discharge: 2020-03-28 | Disposition: A | Discharge status: Home / Self Care | Payer: Medicaid Other | Source: Home / Self Care | Admitting: Psychiatric/Mental Health

## 2020-03-27 ENCOUNTER — Encounter (HOSPITAL_COMMUNITY): Payer: Self-pay | Admitting: Registered Nurse

## 2020-03-27 ENCOUNTER — Other Ambulatory Visit: Payer: Self-pay

## 2020-03-27 DIAGNOSIS — F200 Paranoid schizophrenia: Secondary | ICD-10-CM | POA: Insufficient documentation

## 2020-03-27 DIAGNOSIS — F17210 Nicotine dependence, cigarettes, uncomplicated: Secondary | ICD-10-CM | POA: Insufficient documentation

## 2020-03-27 DIAGNOSIS — Z818 Family history of other mental and behavioral disorders: Secondary | ICD-10-CM | POA: Insufficient documentation

## 2020-03-27 LAB — VALPROIC ACID LEVEL: Valproic Acid Lvl: 59 ug/mL (ref 50.0–100.0)

## 2020-03-27 MED ORDER — TRAZODONE HCL 100 MG PO TABS
100.0000 mg | ORAL_TABLET | Freq: Every evening | ORAL | Status: DC | PRN
  Administered 2020-03-27: 100 mg via ORAL
  Filled 2020-03-27: qty 1

## 2020-03-27 MED ORDER — NICOTINE POLACRILEX 2 MG MT GUM
2.0000 mg | CHEWING_GUM | OROMUCOSAL | Status: DC | PRN
  Administered 2020-03-27 – 2020-03-28 (×3): 2 mg via ORAL
  Filled 2020-03-27 (×3): qty 1

## 2020-03-27 MED ORDER — TRAZODONE HCL 50 MG PO TABS: 50.0000 mg | ORAL_TABLET | Freq: Every evening | ORAL | Status: DC | PRN

## 2020-03-27 MED ORDER — DIVALPROEX SODIUM 500 MG PO DR TAB
1000.0000 mg | DELAYED_RELEASE_TABLET | Freq: Two times a day (BID) | ORAL | Status: DC
  Administered 2020-03-27 – 2020-03-28 (×2): 1000 mg via ORAL
  Filled 2020-03-27 (×2): qty 2

## 2020-03-27 MED ORDER — FLUOXETINE HCL 20 MG PO CAPS
40.0000 mg | ORAL_CAPSULE | Freq: Every day | ORAL | Status: DC
  Administered 2020-03-28: 40 mg via ORAL
  Filled 2020-03-27: qty 2

## 2020-03-27 MED ORDER — OLANZAPINE 7.5 MG PO TABS
15.0000 mg | ORAL_TABLET | Freq: Every day | ORAL | Status: DC
  Administered 2020-03-27: 15 mg via ORAL
  Filled 2020-03-27: qty 2

## 2020-03-27 MED ORDER — OLANZAPINE 5 MG PO TBDP
5.0000 mg | ORAL_TABLET | Freq: Two times a day (BID) | ORAL | Status: DC | PRN
  Administered 2020-03-27 – 2020-03-28 (×2): 5 mg via ORAL
  Filled 2020-03-27 (×2): qty 1

## 2020-03-27 MED ORDER — PANTOPRAZOLE SODIUM 40 MG PO TBEC
40.0000 mg | DELAYED_RELEASE_TABLET | Freq: Every day | ORAL | Status: DC
  Administered 2020-03-28: 40 mg via ORAL
  Filled 2020-03-27: qty 1

## 2020-03-27 MED ORDER — ALUM & MAG HYDROXIDE-SIMETH 200-200-20 MG/5ML PO SUSP: 30.0000 mL | ORAL | Status: DC | PRN

## 2020-03-27 MED ORDER — BENZTROPINE MESYLATE 1 MG PO TABS: 2.0000 mg | ORAL_TABLET | Freq: Two times a day (BID) | ORAL | Status: DC | PRN

## 2020-03-27 MED ORDER — ACETAMINOPHEN 325 MG PO TABS: 650.0000 mg | ORAL_TABLET | Freq: Four times a day (QID) | ORAL | Status: DC | PRN

## 2020-03-27 MED ORDER — HYDROXYZINE HCL 25 MG PO TABS
50.0000 mg | ORAL_TABLET | Freq: Three times a day (TID) | ORAL | Status: DC | PRN
  Administered 2020-03-28: 50 mg via ORAL
  Filled 2020-03-27: qty 2

## 2020-03-27 MED ORDER — HALOPERIDOL 5 MG PO TABS
10.0000 mg | ORAL_TABLET | Freq: Two times a day (BID) | ORAL | Status: DC
  Administered 2020-03-27 – 2020-03-28 (×2): 10 mg via ORAL
  Filled 2020-03-27 (×2): qty 2

## 2020-03-27 MED ORDER — OLANZAPINE 5 MG PO TABS
5.0000 mg | ORAL_TABLET | Freq: Every day | ORAL | Status: DC
  Administered 2020-03-28: 5 mg via ORAL
  Filled 2020-03-27: qty 1

## 2020-03-27 MED ORDER — PROPRANOLOL HCL 10 MG PO TABS
10.0000 mg | ORAL_TABLET | Freq: Three times a day (TID) | ORAL | Status: DC
  Administered 2020-03-27 – 2020-03-28 (×3): 10 mg via ORAL
  Filled 2020-03-27 (×3): qty 1

## 2020-03-27 MED ORDER — ZIPRASIDONE MESYLATE 20 MG IM SOLR: 20.0000 mg | Freq: Once | INTRAMUSCULAR | Status: DC | PRN

## 2020-03-27 MED ORDER — MAGNESIUM HYDROXIDE 400 MG/5ML PO SUSP: 30.0000 mL | Freq: Every day | ORAL | Status: DC | PRN

## 2020-03-27 NOTE — Consult Note (Signed)
[facility] Psych ED Progress Note  03/27/2020 10:21 AM Julia [name]  MRN:  [id number]   Subjective:  "I'm hearing the devil telling [name] that everything is not always sunny; and I see the antichrist." Assessment:  Julia [name], 19 y.o., female patient seen via tele psych by this provider, Dr. Dwyane Dee; and chart reviewed on 03/27/20.  On evaluation Julia [name] reports that she is feeling no better and that she continues to have auditory and visual hallucinations.  Patient states that she is feeling tired; but slept well last night.  Patient also states that she is tolerating her medications without adverse reacting and eating without difficulty.    During evaluation Julia [name] is alert/oriented x 4; calm/cooperative; and mood is congruent with affect.  She does not appear to be responding to internal/external stimuli or delusional thoughts; but continues to endorse auditory and visual hallucinations.  Psychosis may be substance induced.  Patient denies suicidal/self-harm/homicidal ideation, and paranoia.  Patient answered question appropriately.  Spoke to patient sitter who reported that patient has been sleeping most of morning but had not noticed the patient responding to any form of hallucinations.       Principal Problem: Schizophrenia, paranoid ([place]) Diagnosis:  Principal Problem:   Schizophrenia, paranoid ([place])  Total Time spent with patient: 30 minutes  Past Psychiatric History: Psychosis, substance use disorder, suicidal ideations, depression  Past Medical History:  Past Medical History:  Diagnosis Date  . Burning with urination 05/03/2015  . Contraceptive management 05/03/2015  . Depression   . Dysmenorrhea 12/30/2013  . Heroin addiction ([place])   . Menorrhagia 12/30/2013  . Menstrual extraction 12/30/2013  . Migraines   . Social anxiety disorder 09/25/2016  . Suicidal ideations   . Vaginal odor 05/03/2015    Past Surgical History:  Procedure Laterality Date  . NO PAST SURGERIES      Family History:  Family History  Problem Relation Age of Onset  . Depression Mother   . Hypertension Father   . Hyperlipidemia Father   . Cancer Paternal Grandmother        breast, uterine  . Cirrhosis Paternal Grandfather        due to alcohol   Family Psychiatric  History: Unaware Social History:  Social History   Substance and Sexual Activity  Alcohol Use Yes   Comment: BAC was clear     Social History   Substance and Sexual Activity  Drug Use Yes  . Types: Marijuana, Oxycodone   Comment: reports oxycodone and marijuana    Social History   Socioeconomic History  . Marital status: Single    Spouse name: Not on file  . Number of children: Not on file  . Years of education: Not on file  . Highest education level: Not on file  Occupational History  . Occupation: Unemployed  Tobacco Use  . Smoking status: Current Every Day Smoker    Packs/day: 1.00    Types: Cigarettes  . Smokeless tobacco: Never Used  . Tobacco comment: Not ready to quit  Vaping Use  . Vaping Use: Never used  Substance and Sexual Activity  . Alcohol use: Yes    Comment: BAC was clear  . Drug use: Yes    Types: Marijuana, Oxycodone    Comment: reports oxycodone and marijuana  . Sexual activity: Yes    Birth control/protection: Implant  Other Topics Concern  . 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      Lives with Dad. Mom has LGD, lives in [place]. 11th grader. Dog.   Social Determinants of Health   Financial Resource Strain:   . Difficulty of Paying Living Expenses: Not on file  Food Insecurity:   . Worried About Charity fundraiser in the Last Year: Not on file  . Ran Out of Food in the Last Year: Not on file  Transportation Needs:   . Lack of Transportation (Medical): Not on file  . Lack of Transportation (Non-Medical): Not on file   Physical Activity:   . Days of Exercise per Week: Not on file  . Minutes of Exercise per Session: Not on file  Stress:   . Feeling of Stress : Not on file  Social Connections:   . Frequency of Communication with Friends and Family: Not on file  . Frequency of Social Gatherings with Friends and Family: Not on file  . Attends Religious Services: Not on file  . Active Member of Clubs or Organizations: Not on file  . Attends Archivist Meetings: Not on file  . Marital Status: Not on file    Sleep: Good  Appetite:  Good  Current Medications: Current Facility-Administered Medications  Medication Dose Route Frequency Provider Last Rate Last Admin  . benztropine (COGENTIN) tablet 1 mg  1 mg Oral BID Patrecia Pour, NP   1 mg at 03/27/20 0836  . divalproex (DEPAKOTE) DR tablet 1,000 mg  1,000 mg Oral BID Lorin Glass, PA-C   1,000 mg at 03/27/20 0836  . FLUoxetine (PROZAC) capsule 40 mg  40 mg Oral Daily Lorin Glass, [place]   40 mg at 03/27/20 6568  . haloperidol (HALDOL) tablet 10 mg  10 mg Oral BID Lorin Glass, PA-C   10 mg at 03/27/20 1275  . hydrOXYzine (ATARAX/VISTARIL) tablet 25 mg  25 mg Oral Q6H PRN Quintella Reichert, MD   25 mg at 03/26/20 1938  . nicotine (NICODERM CQ - dosed in mg/24 hours) patch 21 mg  21 mg Transdermal Daily PRN Lorin Glass, PA-C      . OLANZapine (ZYPREXA) tablet 5 mg  5 mg Oral Daily Lorin Glass, PA-C   5 mg at 03/27/20 0836  . pantoprazole (PROTONIX) EC tablet 40 mg  40 mg Oral QHS Lorin Glass, [place]   40 mg at 03/26/20 2121  . propranolol (INDERAL) tablet 10 mg  10 mg Oral TID Lorin Glass, PA-C   10 mg at 03/27/20 0836  . traZODone (DESYREL) tablet 100 mg  100 mg Oral QHS PRN Lorin Glass, PA-C   100 mg at 03/26/20 2121   Current Outpatient Medications  Medication Sig Dispense Refill  . benztropine (COGENTIN) 2 MG tablet Take 1 tablet (2 mg total) by mouth 2 (two) times daily as  needed for tremors (EPS). 60 tablet 0  . divalproex (DEPAKOTE) 500 MG DR tablet Take 2 tablets (1,000 mg total) by mouth 2 (two) times daily. For mood stabilization 120 tablet 0  . FLUoxetine (PROZAC) 40 MG capsule Take 1 capsule (40 mg total) by mouth daily. For depression 30 capsule 0  . haloperidol (HALDOL) 10 MG tablet Take 1 tablet (10 mg total) by mouth 2 (two) times daily. For mood control 60 tablet 0  . haloperidol (HALDOL) 5 MG tablet Take 3 tablets (15 mg total)  by mouth 2 (two) times daily. 90 tablet 0  . hydrOXYzine (ATARAX/VISTARIL) 50 MG tablet Take 1 tablet (50 mg total) by mouth 3 (three) times daily as needed for anxiety. 75 tablet 0  . nicotine polacrilex (NICORETTE) 2 MG gum Take 1 each (2 mg total) by mouth as needed. (May buy from over the counter): For smoking cessation 100 tablet 0  . OLANZapine (ZYPREXA) 5 MG tablet Take 1 tablet (5 mg total) by mouth daily. For mood control 30 tablet 0  . OLANZapine zydis (ZYPREXA) 15 MG disintegrating tablet Take 1 tablet (15 mg total) by mouth at bedtime. For mood control 30 tablet 0  . pantoprazole (PROTONIX) 40 MG tablet Take 1 tablet (40 mg total) by mouth at bedtime. For acid rflux 30 tablet 0  . propranolol (INDERAL) 10 MG tablet Take 1 tablet (10 mg total) by mouth 3 (three) times daily. For anxiety 45 tablet 1  . traZODone (DESYREL) 100 MG tablet Take 1 tablet (100 mg total) by mouth at bedtime as needed for sleep. 30 tablet 0    Lab Results:  Results for orders placed or performed during the hospital encounter of 03/25/20 (from the past 48 hour(s))  Urine rapid drug screen (hosp performed)     Status: Abnormal   Collection Time: 03/25/20  5:32 PM  Result Value Ref Range   Opiates NONE DETECTED NONE DETECTED   Cocaine NONE DETECTED NONE DETECTED   Benzodiazepines NONE DETECTED NONE DETECTED   Amphetamines NONE DETECTED NONE DETECTED   Tetrahydrocannabinol POSITIVE (A) NONE DETECTED   Barbiturates NONE DETECTED NONE DETECTED     Comment: (NOTE) DRUG SCREEN FOR MEDICAL PURPOSES ONLY.  IF CONFIRMATION IS NEEDED FOR ANY PURPOSE, NOTIFY LAB WITHIN 5 DAYS.  LOWEST DETECTABLE LIMITS FOR URINE DRUG SCREEN Drug Class                     Cutoff (ng/mL) Amphetamine and metabolites    1000 Barbiturate and metabolites    200 Benzodiazepine                 960 Tricyclics and metabolites     300 Opiates and metabolites        300 Cocaine and metabolites        300 THC                            50 Performed at [facility], [place] [address] [place], [place] [address]   CBC     Status: None   Collection Time: 03/25/20  5:56 PM  Result Value Ref Range   WBC 7.1 4.0 - 10.5 K/uL   RBC 4.79 3.87 - 5.11 MIL/uL   Hemoglobin 13.9 12.0 - 15.0 g/dL   HCT 43.5 36 - 46 %   MCV 90.8 80.0 - 100.0 fL   MCH 29.0 26.0 - 34.0 pg   MCHC 32.0 30.0 - 36.0 g/dL   RDW 15.2 11.5 - 15.5 %   Platelets 253 150 - 400 K/uL   nRBC 0.0 0.0 - 0.2 %    Comment: Performed at [facility], [place] [address], [place], [place] [address]  Comprehensive metabolic panel     Status: None   Collection Time: 03/25/20  5:56 PM  Result Value Ref Range   Sodium 140 135 - 145 mmol/L   Potassium 3.7 3.5 - 5.1 mmol/L   Chloride 106 98 - 111  mmol/L   CO2 22 22 - 32 mmol/L   Glucose, Bld 99 70 - 99 mg/dL    Comment: Glucose reference range applies only to samples taken after fasting for at least 8 hours.   BUN 11 6 - 20 mg/dL   Creatinine, Ser 0.79 0.44 - 1.00 mg/dL   Calcium 9.0 8.9 - 10.3 mg/dL   Total Protein 7.1 6.5 - 8.1 g/dL   Albumin 4.0 3.5 - 5.0 g/dL   AST 19 15 - 41 U/L   ALT 16 0 - 44 U/L   Alkaline Phosphatase 77 38 - 126 U/L   Total Bilirubin 0.4 0.3 - 1.2 mg/dL   GFR calc non Af Amer >60 >60 mL/min   GFR calc Af Amer >60 >60 mL/min   Anion gap 12 5 - 15    Comment: Performed at [facility], [place] [address] [place] [address] [place], [place] [address]  I-Stat beta hCG blood, ED     Status: None   Collection Time: 03/25/20   6:00 PM  Result Value Ref Range   I-stat hCG, quantitative <5.0 <5 mIU/mL   Comment 3            Comment:   GEST. AGE      CONC.  (mIU/mL)   <=1 WEEK        5 - 50     2 WEEKS       50 - 500     3 WEEKS       100 - 10,000     4 WEEKS     1,000 - 30,000        FEMALE AND NON-PREGNANT FEMALE:     LESS THAN 5 mIU/mL   SARS Coronavirus 2 by RT PCR (hospital order, performed in [place] hospital lab) Nasopharyngeal Nasopharyngeal Swab     Status: None   Collection Time: 03/25/20  7:35 PM   Specimen: Nasopharyngeal Swab  Result Value Ref Range   SARS Coronavirus 2 NEGATIVE NEGATIVE    Comment: (NOTE) SARS-CoV-2 target nucleic acids are NOT DETECTED.  The SARS-CoV-2 RNA is generally detectable in upper and lower respiratory specimens during the acute phase of infection. The lowest concentration of SARS-CoV-2 viral copies this assay can detect is 250 copies / mL. A negative result does not preclude SARS-CoV-2 infection and should not be used as the sole basis for treatment or other patient management decisions.  A negative result may occur with improper specimen collection / handling, submission of specimen other than nasopharyngeal swab, presence of viral mutation(s) within the areas targeted by this assay, and inadequate number of viral copies (<250 copies / mL). A negative result must be combined with clinical observations, patient history, and epidemiological information.  Fact Sheet for Patients:   StrictlyIdeas.no  Fact Sheet for Healthcare Providers: BankingDealers.co.za  This test is not yet approved or  cleared by the [place] FDA and has been authorized for detection and/or diagnosis of SARS-CoV-2 by FDA under an Emergency Use Authorization (EUA).  This EUA will remain in effect (meaning this test can be used) for the duration of the COVID-19 declaration under Section 564(b)(1) of the Act, 21 U.S.C. section  360bbb-3(b)(1), unless the authorization is terminated or revoked sooner.  Performed at [facility], [place] [address], [place],  [address]   Valproic acid level     Status: None   Collection Time: 03/27/20  4:30 AM  Result Value Ref Range   Valproic Acid Lvl  59 50.0 - 100.0 ug/mL    Comment: Performed at [facility], [place] [address] [place], [place] [address]    Blood Alcohol level:  Lab Results  Component Value Date   ETH <10 10/16/2019   ETH <10 07/09/2019    Physical Findings: AIMS:  , ,  ,  ,    CIWA:    COWS:     Musculoskeletal: Strength & Muscle Tone: within normal limits Gait & Station: normal Patient leans: N/A  Psychiatric Specialty Exam: Physical Exam Vitals and nursing note reviewed. Exam conducted with a chaperone present.  Constitutional:      Appearance: Normal appearance.  Pulmonary:     Effort: Pulmonary effort is normal.  Musculoskeletal:        General: Normal range of motion.  Neurological:     Mental Status: She is alert.  Psychiatric:        Attention and Perception: Attention normal. She perceives auditory (Reporting auditory and visual hallucinations) hallucinations.        Mood and Affect: Mood is depressed.        Speech: Speech normal.        Behavior: Behavior normal. Behavior is cooperative.        Thought Content: Thought content is not paranoid. Thought content does not include homicidal or suicidal ideation.        Cognition and Memory: Cognition and memory normal.        Judgment: Judgment is impulsive.     Review of Systems  Psychiatric/Behavioral: Positive for hallucinations. Negative for agitation, self-injury, sleep disturbance and suicidal ideas.       Patient states that she continues to hear voices and see things.    All other systems reviewed and are negative.   Blood pressure 98/74, pulse 71, temperature 98.2 F (36.8 C), temperature source Oral, resp. rate 18, SpO2 95  %.There is no height or weight on file to calculate BMI.  General Appearance: Casual  Eye Contact:  Good  Speech:  Clear and Coherent and Normal Rate  Volume:  Normal  Mood:  "tired"  Affect:  Appropriate and Congruent  Thought Process:  Coherent, Goal Directed and Descriptions of Associations: Intact  Orientation:  Full (Time, Place, and Person)  Thought Content:  Hallucinations: Auditory Visual  Suicidal Thoughts:  No  Homicidal Thoughts:  No  Memory:  Immediate;   Fair Recent;   Fair  Judgement:  Intact  Insight:  Fair  Psychomotor Activity:  Normal  Concentration:  Concentration: Good and Attention Span: Good  Recall:  [employer] of Knowledge:  Fair  Language:  Good  Akathisia:  No  Handed:  Right  AIMS (if indicated):     Assets:  Communication Skills Desire for Improvement Social Support  ADL's:  Intact  Cognition:  WNL  Sleep:       Treatment Plan Summary: Plan Admission to continuous assessment unit at [facility] Lashai Grosch, NP 03/27/2020, 10:21 AM

## 2020-03-27 NOTE — BH Assessment (Signed)
[facility] Assessment Progress Note  Per Shuvon Rankin, FNP, pt is to be is transferred to the [facility].  Roxy Manns, RN agrees to accept pt.  Please call report to [phone].  Pt is to be transported via [facility].  EDP Davonna Belling, MD and pt's nurse, Dawn, have been notified.  Jalene Mullet, [place] Coordinator [phone]

## 2020-03-27 NOTE — ED Notes (Signed)
Meal given

## 2020-03-27 NOTE — Progress Notes (Signed)
Received Julia [name] from [facility], alert and oriented x 4. She was calm and cooperative upon arrival. She made several phone calls. She napped for several hours, woke up and ate per her choice. She watched TV for a period of time and received a snack. Shortly thereafter she had brief episodes of  Verbal outbursts and laughter. At 1824 hrs her outburts increased in villosity with crying and pacing. She received Zyprexa 5 mg PO without incident.

## 2020-03-27 NOTE — ED Notes (Signed)
[place] number 3.

## 2020-03-27 NOTE — ED Provider Notes (Signed)
Behavioral Health Admission H&P [facility] & OBS)  Date: 03/27/20 Patient Name: Julia [name] MRN: [id number] Chief Complaint:  Chief Complaint  Patient presents with  . Hallucinations      Diagnoses:  Final diagnoses:  Paranoid schizophrenia ([place])    HPI: Per admission assessment: Julia [name] is an 19 y.o. female that presents this date at [facility] as a walk in. Patient is brought in by her father who states the patient had contacted him earlier this date from a local motel requesting to be transported to [facility] for a mental health evaluation. Patient reports ongoing S/I this date although is vague in reference to a plan. Patient is observed to be very agitated and liable as she interacts with this Probation officer. Patient will not respond when asked about H/I. Patient states "she has thought about it" although will not elaborate on content. Patient is unable to participate in the assessment due to current AMS. Patient is observed to be very tearful and difficult to redirect. Patient reports ongoing AVH although again will not elaborate on content. Patient keeps repeating, "They are going to kill [name]." Patient is unable to participate in the assessment. Patient is familiar to area providers and was discharged from [facility] on 03/17/20. Patient's father this date states patient is currently homeless and has not been compliant with her medication regimen since her discharge.  Per ED psych eval today:  "I'm hearing the devil telling [name] that everything is not always sunny; and I see the antichrist." Assessment:  Julia [name], 19 y.o., female patient seen via tele psych by this provider, Dr. Dwyane Dee; and chart reviewed on 03/27/20.  On evaluation Julia [name] reports that she is feeling no better and that she continues to have auditory and visual hallucinations.  Patient states that she is feeling tired; but slept well last night.  Patient also states that she is tolerating her medications without adverse reacting and eating  without difficulty.    During evaluation Julia [name] is alert/oriented x 4; calm/cooperative; and mood is congruent with affect.  She does not appear to be responding to internal/external stimuli or delusional thoughts; but continues to endorse auditory and visual hallucinations.  Psychosis may be substance induced.  Patient denies suicidal/self-harm/homicidal ideation, and paranoia.  Patient answered question appropriately.  Spoke to patient sitter who reported that patient has been sleeping most of morning but had not noticed the patient responding to any form of hallucinations.      Per medical student's assessment today: Julia [name] states that she has been experiencing auditory and visual hallucinations over the past week. She reports that "Irving Shows has put a hit out on [name]" and that she is able to see "the devil and antichrist." Reports seeing the "devil" multiple times a day, and sometimes all days long. No new stressors in her life, or anything that aggravates or brings about hallucinations. She also reports thoughts of self-harm over the past few days. When asked if she has a plan to self-harm, she states " I would hang myself." Patient endorses that it is to the point that she feels like she is past contemplation and would commit suicide. Patient feeling depressed that she describes as "helplessness" and anxiety. Uses marijuana recreationally and occasionally drinks a "bootlicker" of malt liquor every other day. No other substance use at this time. Patient appears disheveled and tired sitting upright in chair. Speech is slow, and slightly guarded. Describes her mood as "shocked." Affect congruent with stated mood, blunted.  Thought process is linear. Thought content significant for SI, without HI, delusions, or ideas of reference. Perceptual disturbances significant for visual and auditor hallucinations. Attention, concentration, and memory appropriate. A&Ox4. Insight good. Judgement fair.  On my  assessment this afternoon, patient found lying calmly in bed with blunted affect. She reports stopping all her medications after discharge from [facility] last week "because I forgot to take them." UDS is positive for THC. She admits to suicidal thoughts yesterday and earlier today but denies SI at this time. She is unable to identify triggers for recent SI. She denies HI/AVH. She does not appear to be responding to internal stimuli. She is able to provide logical answers on assessment. No delusional or paranoid thought content expressed. She states she had been living with a friend after discharge from [facility], but she is not able to return to his house and is currently homeless.  PHQ 2-9:    Office Visit from 07/11/2016 in [facility]  Thoughts that you would be better off dead, or of hurting yourself in some way Not at all  PHQ-9 Total Score 0        ED from 03/25/2020 in [place] DEPT Admission (Discharged) from 03/16/2020 in [place] 500B Admission (Discharged) from OP Visit from 10/15/2019 in [place] 300B  C-SSRS RISK CATEGORY High Risk No Risk Low Risk       Total Time spent with patient: 30 minutes  Musculoskeletal  Strength & Muscle Tone: within normal limits Gait & Station: normal Patient leans: N/A  Psychiatric Specialty Exam  Presentation General Appearance: Casual;Appropriate for Environment  Eye Contact:Fair  Speech:Normal Rate;Clear and Coherent  Speech Volume:Normal  Handedness:Right   Mood and Affect  Mood:Euthymic  Affect:Blunt   Thought Process  Thought Processes:Coherent  Descriptions of Associations:Tangential  Orientation:Full (Time, Place and Person)  Thought Content:Delusions  Hallucinations:Hallucinations: None  Ideas of Reference:None  Suicidal Thoughts:Suicidal Thoughts: No  Homicidal Thoughts:Homicidal Thoughts: No   Sensorium  Memory:Immediate  Fair;Recent Fair;Remote Fair  Judgment:Poor  Insight:Lacking   Executive Functions  Concentration:Fair  Attention Span:Fair  [place]   Psychomotor Activity  Psychomotor Activity:Psychomotor Activity: Normal   Assets  Assets:Communication Skills;Desire for Improvement;Resilience   Sleep  Sleep:Sleep: Fair   Physical Exam ROS  Pulse 86, temperature 97.8 F (36.6 C), temperature source Oral, resp. rate 18, height 5\' 3"  (1.6 m), weight 170 lb (77.1 kg), SpO2 98 %. Body mass index is 30.11 kg/m.  Past Psychiatric History: History of polysubstance abuse and schizophrenia with 3 admissions to [place] services since December 2020, most recently discharged from [facility] on 03/22/2020.  Is the patient at risk to self? Yes  Has the patient been a risk to self in the past 6 months? Yes .    Has the patient been a risk to self within the distant past? Yes   Is the patient a risk to others? No   Has the patient been a risk to others in the past 6 months? No   Has the patient been a risk to others within the distant past? No   Past Medical History:  Past Medical History:  Diagnosis Date  . Burning with urination 05/03/2015  . Contraceptive management 05/03/2015  . Depression   . Dysmenorrhea 12/30/2013  . Heroin addiction ([place])   . Menorrhagia 12/30/2013  . Menstrual extraction 12/30/2013  . Migraines   . Social anxiety disorder 09/25/2016  .  Suicidal ideations   . Vaginal odor 05/03/2015    Past Surgical History:  Procedure Laterality Date  . NO PAST SURGERIES      Family History:  Family History  Problem Relation Age of Onset  . Depression Mother   . Hypertension Father   . Hyperlipidemia Father   . Cancer Paternal Grandmother        breast, uterine  . Cirrhosis Paternal Grandfather        due to alcohol    Social History:  Social History   Socioeconomic History  . Marital status: Single    Spouse  name: Not on file  . Number of children: Not on file  . Years of education: Not on file  . Highest education level: Not on file  Occupational History  . Occupation: Unemployed  Tobacco Use  . Smoking status: Current Every Day Smoker    Packs/day: 1.00    Types: Cigarettes  . Smokeless tobacco: Never Used  . Tobacco comment: Not ready to quit  Vaping Use  . Vaping Use: Never used  Substance and Sexual Activity  . Alcohol use: Yes    Comment: BAC was clear  . Drug use: Yes    Types: Marijuana, Oxycodone    Comment: reports oxycodone and marijuana  . Sexual activity: Yes    Birth control/protection: Implant  Other Topics Concern  . Not on file  Social History Narrative   06/23/2019:  Pt stated that she is homeless, that she is a high school graduate, and that she is unemployed and not followed by any outpatient provider.      Lives with Dad. Mom has LGD, lives in [place]. 11th grader. Dog.   Social Determinants of Health   Financial Resource Strain:   . Difficulty of Paying Living Expenses: Not on file  Food Insecurity:   . Worried About Charity fundraiser in the Last Year: Not on file  . Ran Out of Food in the Last Year: Not on file  Transportation Needs:   . Lack of Transportation (Medical): Not on file  . Lack of Transportation (Non-Medical): Not on file  Physical Activity:   . Days of Exercise per Week: Not on file  . Minutes of Exercise per Session: Not on file  Stress:   . Feeling of Stress : Not on file  Social Connections:   . Frequency of Communication with Friends and Family: Not on file  . Frequency of Social Gatherings with Friends and Family: Not on file  . Attends Religious Services: Not on file  . Active Member of Clubs or Organizations: Not on file  . Attends Archivist Meetings: Not on file  . Marital Status: Not on file  Intimate Partner Violence:   . Fear of Current or Ex-Partner: Not on file  . Emotionally Abused: Not on file  . Physically  Abused: Not on file  . Sexually Abused: Not on file    SDOH:  SDOH Screenings   Alcohol Screen: Low Risk   . Last Alcohol Screening Score (AUDIT): 0  Depression (PHQ2-9):   . PHQ-2 Score: Not on file  Financial Resource Strain:   . Difficulty of Paying Living Expenses: Not on file  Food Insecurity:   . Worried About Charity fundraiser in the Last Year: Not on file  . Ran Out of Food in the Last Year: Not on file  Housing:   . Last Housing Risk Score: Not on file  Physical Activity:   .  Days of Exercise per Week: Not on file  . Minutes of Exercise per Session: Not on file  Social Connections:   . Frequency of Communication with Friends and Family: Not on file  . Frequency of Social Gatherings with Friends and Family: Not on file  . Attends Religious Services: Not on file  . Active Member of Clubs or Organizations: Not on file  . Attends Archivist Meetings: Not on file  . Marital Status: Not on file  Stress:   . Feeling of Stress : Not on file  Tobacco Use: High Risk  . Smoking Tobacco Use: Current Every Day Smoker  . Smokeless Tobacco Use: Never Used  Transportation Needs:   . Film/video editor (Medical): Not on file  . Lack of Transportation (Non-Medical): Not on file    Last Labs:  Admission on 03/25/2020, Discharged on 03/27/2020  Component Date Value Ref Range Status  . SARS Coronavirus 2 03/25/2020 NEGATIVE  NEGATIVE Final   Comment: (NOTE) SARS-CoV-2 target nucleic acids are NOT DETECTED.  The SARS-CoV-2 RNA is generally detectable in upper and lower respiratory specimens during the acute phase of infection. The lowest concentration of SARS-CoV-2 viral copies this assay can detect is 250 copies / mL. A negative result does not preclude SARS-CoV-2 infection and should not be used as the sole basis for treatment or other patient management decisions.  A negative result may occur with improper specimen collection / handling, submission of specimen  other than nasopharyngeal swab, presence of viral mutation(s) within the areas targeted by this assay, and inadequate number of viral copies (<250 copies / mL). A negative result must be combined with clinical observations, patient history, and epidemiological information.  Fact Sheet for Patients:   StrictlyIdeas.no  Fact Sheet for Healthcare Providers: BankingDealers.co.za  This test is not yet approved or                           cleared by the [place] FDA and has been authorized for detection and/or diagnosis of SARS-CoV-2 by FDA under an Emergency Use Authorization (EUA).  This EUA will remain in effect (meaning this test can be used) for the duration of the COVID-19 declaration under Section 564(b)(1) of the Act, 21 U.S.C. section 360bbb-3(b)(1), unless the authorization is terminated or revoked sooner.  Performed at [facility], [place] [address], [place], [place] [address]   . Opiates 03/25/2020 NONE DETECTED  NONE DETECTED Final  . Cocaine 03/25/2020 NONE DETECTED  NONE DETECTED Final  . Benzodiazepines 03/25/2020 NONE DETECTED  NONE DETECTED Final  . Amphetamines 03/25/2020 NONE DETECTED  NONE DETECTED Final  . Tetrahydrocannabinol 03/25/2020 POSITIVE* NONE DETECTED Final  . Barbiturates 03/25/2020 NONE DETECTED  NONE DETECTED Final   Comment: (NOTE) DRUG SCREEN FOR MEDICAL PURPOSES ONLY.  IF CONFIRMATION IS NEEDED FOR ANY PURPOSE, NOTIFY LAB WITHIN 5 DAYS.  LOWEST DETECTABLE LIMITS FOR URINE DRUG SCREEN Drug Class                     Cutoff (ng/mL) Amphetamine and metabolites    1000 Barbiturate and metabolites    200 Benzodiazepine                 992 Tricyclics and metabolites     300 Opiates and metabolites        300 Cocaine and metabolites        300 THC  50 Performed at [facility], [place] [address] [place], [place] [address]   . I-stat hCG,  quantitative 03/25/2020 <5.0  <5 mIU/mL Final  . Comment 3 03/25/2020          Final   Comment:   GEST. AGE      CONC.  (mIU/mL)   <=1 WEEK        5 - 50     2 WEEKS       50 - 500     3 WEEKS       100 - 10,000     4 WEEKS     1,000 - 30,000        FEMALE AND NON-PREGNANT FEMALE:     LESS THAN 5 mIU/mL   . WBC 03/25/2020 7.1  4.0 - 10.5 K/uL Final  . RBC 03/25/2020 4.79  3.87 - 5.11 MIL/uL Final  . Hemoglobin 03/25/2020 13.9  12.0 - 15.0 g/dL Final  . HCT 03/25/2020 43.5  36 - 46 % Final  . MCV 03/25/2020 90.8  80.0 - 100.0 fL Final  . MCH 03/25/2020 29.0  26.0 - 34.0 pg Final  . MCHC 03/25/2020 32.0  30.0 - 36.0 g/dL Final  . RDW 03/25/2020 15.2  11.5 - 15.5 % Final  . Platelets 03/25/2020 253  150 - 400 K/uL Final  . nRBC 03/25/2020 0.0  0.0 - 0.2 % Final   Performed at [facility], [place] [address] [place], [place] [address]  . Sodium 03/25/2020 140  135 - 145 mmol/L Final  . Potassium 03/25/2020 3.7  3.5 - 5.1 mmol/L Final  . Chloride 03/25/2020 106  98 - 111 mmol/L Final  . CO2 03/25/2020 22  22 - 32 mmol/L Final  . Glucose, Bld 03/25/2020 99  70 - 99 mg/dL Final   Glucose reference range applies only to samples taken after fasting for at least 8 hours.  . BUN 03/25/2020 11  6 - 20 mg/dL Final  . Creatinine, Ser 03/25/2020 0.79  0.44 - 1.00 mg/dL Final  . Calcium 03/25/2020 9.0  8.9 - 10.3 mg/dL Final  . Total Protein 03/25/2020 7.1  6.5 - 8.1 g/dL Final  . Albumin 03/25/2020 4.0  3.5 - 5.0 g/dL Final  . AST 03/25/2020 19  15 - 41 U/L Final  . ALT 03/25/2020 16  0 - 44 U/L Final  . Alkaline Phosphatase 03/25/2020 77  38 - 126 U/L Final  . Total Bilirubin 03/25/2020 0.4  0.3 - 1.2 mg/dL Final  . GFR calc non Af Amer 03/25/2020 >60  >60 mL/min Final  . GFR calc Af Amer 03/25/2020 >60  >60 mL/min Final  . Anion gap 03/25/2020 12  5 - 15 Final   Performed at [place] [address], [place], [place] [address]  . Valproic Acid Lvl 03/27/2020 59   50.0 - 100.0 ug/mL Final   Performed at [facility], [place] [address] [place], [place] [address]  Admission on 03/16/2020, Discharged on 03/22/2020  Component Date Value Ref Range Status  . WBC 03/17/2020 8.2  4.0 - 10.5 K/uL Final  . RBC 03/17/2020 4.61  3.87 - 5.11 MIL/uL Final  . Hemoglobin 03/17/2020 13.3  12.0 - 15.0 g/dL Final  . HCT 03/17/2020 41.9  36 - 46 % Final  . MCV 03/17/2020 90.9  80.0 - 100.0 fL Final  . MCH 03/17/2020 28.9  26.0 - 34.0 pg Final  . MCHC 03/17/2020 31.7  30.0 - 36.0 g/dL Final  . RDW 03/17/2020 15.5  11.5 - 15.5 % Final  . Platelets 03/17/2020 267  150 - 400 K/uL Final  . nRBC 03/17/2020 0.0  0.0 - 0.2 % Final  . Neutrophils Relative % 03/17/2020 53  % Final  . Neutro Abs 03/17/2020 4.4  1.7 - 7.7 K/uL Final  . Lymphocytes Relative 03/17/2020 35  % Final  . Lymphs Abs 03/17/2020 2.9  0.7 - 4.0 K/uL Final  . Monocytes Relative 03/17/2020 9  % Final  . Monocytes Absolute 03/17/2020 0.7  0 - 1 K/uL Final  . Eosinophils Relative 03/17/2020 1  % Final  . Eosinophils Absolute 03/17/2020 0.1  0 - 0 K/uL Final  . Basophils Relative 03/17/2020 1  % Final  . Basophils Absolute 03/17/2020 0.0  0 - 0 K/uL Final  . nRBC 03/17/2020 HIDE  0 /100 WBC Final  . Immature Granulocytes 03/17/2020 1  % Final  . Abs Immature Granulocytes 03/17/2020 0.06  0.00 - 0.07 K/uL Final   Performed at [facility], [place] [address], [place], [place] [address]  . Sodium 03/17/2020 138  135 - 145 mmol/L Final  . Potassium 03/17/2020 3.8  3.5 - 5.1 mmol/L Final  . Chloride 03/17/2020 104  98 - 111 mmol/L Final  . CO2 03/17/2020 24  22 - 32 mmol/L Final  . Glucose, Bld 03/17/2020 96  70 - 99 mg/dL Final   Glucose reference range applies only to samples taken after fasting for at least 8 hours.  . BUN 03/17/2020 15  6 - 20 mg/dL Final  . Creatinine, Ser 03/17/2020 0.84  0.44 - 1.00 mg/dL Final  . Calcium 03/17/2020 9.1  8.9 - 10.3 mg/dL Final  .  Total Protein 03/17/2020 7.3  6.5 - 8.1 g/dL Final  . Albumin 03/17/2020 4.0  3.5 - 5.0 g/dL Final  . AST 03/17/2020 18  15 - 41 U/L Final  . ALT 03/17/2020 16  0 - 44 U/L Final  . Alkaline Phosphatase 03/17/2020 83  38 - 126 U/L Final  . Total Bilirubin 03/17/2020 0.1* 0.3 - 1.2 mg/dL Final  . GFR calc non Af Amer 03/17/2020 >60  >60 mL/min Final  . GFR calc Af Amer 03/17/2020 >60  >60 mL/min Final  . Anion gap 03/17/2020 10  5 - 15 Final   Performed at [facility], [place] [address], [place], [place] [address]  . Hgb A1c MFr Bld 03/17/2020 5.5  4.8 - 5.6 % Final   Comment: (NOTE) Pre diabetes:          5.7%-6.4%  Diabetes:              >6.4%  Glycemic control for   <7.0% adults with diabetes   . Mean Plasma Glucose 03/17/2020 111.15  mg/dL Final   Performed at [place] [address], [place], [place] [address]  . Preg, Serum 03/17/2020 NEGATIVE  NEGATIVE Final   Comment:        THE SENSITIVITY OF THIS METHODOLOGY IS >10 mIU/mL. Performed at [facility], [place] [address], [place], [place] [address]   . Color, Urine 03/20/2020 STRAW* YELLOW Final  . APPearance 03/20/2020 CLEAR  CLEAR Final  . Specific Gravity, Urine 03/20/2020 1.008  1.005 - 1.030 Final  . pH 03/20/2020 7.0  5.0 - 8.0 Final  . Glucose, UA 03/20/2020 NEGATIVE  NEGATIVE mg/dL Final  . Hgb urine dipstick 03/20/2020 NEGATIVE  NEGATIVE Final  .  Bilirubin Urine 03/20/2020 NEGATIVE  NEGATIVE Final  . Ketones, ur 03/20/2020 NEGATIVE  NEGATIVE mg/dL Final  . Protein, ur 03/20/2020 NEGATIVE  NEGATIVE mg/dL Final  . Nitrite 03/20/2020 NEGATIVE  NEGATIVE Final  . Chalmers Guest 03/20/2020 NEGATIVE  NEGATIVE Final  . RBC / HPF 03/20/2020 0-5  0 - 5 RBC/hpf Final  . WBC, UA 03/20/2020 0-5  0 - 5 WBC/hpf Final  . Bacteria, UA 03/20/2020 RARE* NONE SEEN Final  . Squamous Epithelial / LPF 03/20/2020 0-5  0 - 5 Final   Performed at [facility], [place] [address] [place], [place] [address]  . Opiates 03/20/2020 NONE DETECTED  NONE DETECTED Final  . Cocaine 03/20/2020 NONE DETECTED  NONE DETECTED Final  . Benzodiazepines 03/20/2020 NONE DETECTED  NONE DETECTED Final  . Amphetamines 03/20/2020 NONE DETECTED  NONE DETECTED Final  . Tetrahydrocannabinol 03/20/2020 POSITIVE* NONE DETECTED Final  . Barbiturates 03/20/2020 NONE DETECTED  NONE DETECTED Final   Comment: (NOTE) DRUG SCREEN FOR MEDICAL PURPOSES ONLY.  IF CONFIRMATION IS NEEDED FOR ANY PURPOSE, NOTIFY LAB WITHIN 5 DAYS.  LOWEST DETECTABLE LIMITS FOR URINE DRUG SCREEN Drug Class                     Cutoff (ng/mL) Amphetamine and metabolites    1000 Barbiturate and metabolites    200 Benzodiazepine                 595 Tricyclics and metabolites     300 Opiates and metabolites        300 Cocaine and metabolites        300 THC                            50 Performed at [facility], [place] [address], [place], [place] [address]   . TSH 03/21/2020 0.876  0.350 - 4.500 uIU/mL Final   Comment: Performed by a 3rd Generation assay with a functional sensitivity of <=0.01 uIU/mL. Performed at [facility], [place] [address], [place], [place] [address]   Admission on 03/16/2020, Discharged on 03/16/2020  Component Date Value Ref Range Status  . SARS Coronavirus 2 03/16/2020 NEGATIVE  NEGATIVE Final   Comment: (NOTE) SARS-CoV-2 target nucleic acids are NOT DETECTED.  The SARS-CoV-2 RNA is generally detectable in upper and lower respiratory specimens during the acute phase of infection. The lowest concentration of SARS-CoV-2 viral copies this assay can detect is 250 copies / mL. A negative result does not preclude SARS-CoV-2 infection and should not be used as the sole basis for treatment or other patient management decisions.  A negative result may occur with improper specimen collection / handling, submission of specimen other than nasopharyngeal swab,  presence of viral mutation(s) within the areas targeted by this assay, and inadequate number of viral copies (<250 copies / mL). A negative result must be combined with clinical observations, patient history, and epidemiological information.  Fact Sheet for Patients:   StrictlyIdeas.no  Fact Sheet for Healthcare Providers: BankingDealers.co.za  This test is not yet approved or                           cleared by the [place] FDA and has been authorized for detection and/or diagnosis of SARS-CoV-2 by FDA under an Emergency Use Authorization (EUA).  This EUA will remain in effect (meaning this test can be used)  for the duration of the COVID-19 declaration under Section 564(b)(1) of the Act, 21 U.S.C. section 360bbb-3(b)(1), unless the authorization is terminated or revoked sooner.  Performed at [facility], [place] [address], [place], [place] [address]   . SARS Coronavirus 2 Ag 03/16/2020 NEGATIVE  NEGATIVE Final   Comment: (NOTE) SARS-CoV-2 antigen NOT DETECTED.   Negative results are presumptive.  Negative results do not preclude SARS-CoV-2 infection and should not be used as the sole basis for treatment or other patient management decisions, including infection  control decisions, particularly in the presence of clinical signs and  symptoms consistent with COVID-19, or in those who have been in contact with the virus.  Negative results must be combined with clinical observations, patient history, and epidemiological information. The expected result is Negative.  Fact Sheet for Patients: PodPark.tn  Fact Sheet for Healthcare Providers: GiftContent.is   This test is not yet approved or cleared by the [place] FDA and  has been authorized for detection and/or diagnosis of SARS-CoV-2 by FDA under an Emergency Use Authorization (EUA).  This EUA will remain in  effect (meaning this test can be used) for the duration of  the C                          OVID-19 declaration under Section 564(b)(1) of the Act, 21 U.S.C. section 360bbb-3(b)(1), unless the authorization is terminated or revoked sooner.    Procedure visit on 01/19/2020  Component Date Value Ref Range Status  . Bacterial Vaginitis (gardnerella) 01/19/2020 Positive*  Final  . Candida Vaginitis 01/19/2020 Negative   Final  . Candida Glabrata 01/19/2020 Negative   Final  . Trichomonas 01/19/2020 Negative   Final  . Chlamydia 01/19/2020 Negative   Final  . Neisseria Gonorrhea 01/19/2020 Negative   Final  . Comment 01/19/2020 Normal Reference Range Bacterial Vaginosis - Negative   Final  . Comment 01/19/2020 Normal Reference Range Candida Species - Negative   Final  . Comment 01/19/2020 Normal Reference Range Candida Galbrata - Negative   Final  . Comment 01/19/2020 Normal Reference Range Trichomonas - Negative   Final  . Comment 01/19/2020 Normal Reference Ranger Chlamydia - Negative   Final  . Comment 01/19/2020 Normal Reference Range Neisseria Gonorrhea - Negative   Final  Admission on 10/15/2019, Discharged on 11/26/2019  Component Date Value Ref Range Status  . SARS Coronavirus 2 by RT PCR 10/15/2019 NEGATIVE  NEGATIVE Final   Comment: (NOTE) SARS-CoV-2 target nucleic acids are NOT DETECTED. The SARS-CoV-2 RNA is generally detectable in upper respiratoy specimens during the acute phase of infection. The lowest concentration of SARS-CoV-2 viral copies this assay can detect is 131 copies/mL. A negative result does not preclude SARS-Cov-2 infection and should not be used as the sole basis for treatment or other patient management decisions. A negative result may occur with  improper specimen collection/handling, submission of specimen other than nasopharyngeal swab, presence of viral mutation(s) within the areas targeted by this assay, and inadequate number of viral copies (<131  copies/mL). A negative result must be combined with clinical observations, patient history, and epidemiological information. The expected result is Negative. Fact Sheet for Patients:  PinkCheek.be Fact Sheet for Healthcare Providers:  GravelBags.it This test is not yet ap                          proved or cleared by the [place] and  has been authorized for detection and/or diagnosis of SARS-CoV-2 by FDA under an Emergency Use Authorization (EUA). This EUA will remain  in effect (meaning this test can be used) for the duration of the COVID-19 declaration under Section 564(b)(1) of the Act, 21 U.S.C. section 360bbb-3(b)(1), unless the authorization is terminated or revoked sooner.   . Influenza A by PCR 10/15/2019 NEGATIVE  NEGATIVE Final  . Influenza B by PCR 10/15/2019 NEGATIVE  NEGATIVE Final   Comment: (NOTE) The Xpert Xpress SARS-CoV-2/FLU/RSV assay is intended as an aid in  the diagnosis of influenza from Nasopharyngeal swab specimens and  should not be used as a sole basis for treatment. Nasal washings and  aspirates are unacceptable for Xpert Xpress SARS-CoV-2/FLU/RSV  testing. Fact Sheet for Patients: PinkCheek.be Fact Sheet for Healthcare Providers: GravelBags.it This test is not yet approved or cleared by the [place] FDA and  has been authorized for detection and/or diagnosis of SARS-CoV-2 by  FDA under an Emergency Use Authorization (EUA). This EUA will remain  in effect (meaning this test can be used) for the duration of the  Covid-19 declaration under Section 564(b)(1) of the Act, 21  U.S.C. section 360bbb-3(b)(1), unless the authorization is  terminated or revoked. Performed at [facility], [place] [address] [place], [place] [address]   . WBC 10/16/2019 6.5  4.0 - 10.5 K/uL Final  . RBC 10/16/2019 4.70  3.87 - 5.11  MIL/uL Final  . Hemoglobin 10/16/2019 13.4  12.0 - 15.0 g/dL Final  . HCT 10/16/2019 42.6  36 - 46 % Final  . MCV 10/16/2019 90.6  80.0 - 100.0 fL Final  . MCH 10/16/2019 28.5  26.0 - 34.0 pg Final  . MCHC 10/16/2019 31.5  30.0 - 36.0 g/dL Final  . RDW 10/16/2019 13.2  11.5 - 15.5 % Final  . Platelets 10/16/2019 230  150 - 400 K/uL Final  . nRBC 10/16/2019 0.0  0.0 - 0.2 % Final   Performed at [facility], [place] [address], [place], [place] [address]  . Sodium 10/16/2019 140  135 - 145 mmol/L Final  . Potassium 10/16/2019 4.1  3.5 - 5.1 mmol/L Final  . Chloride 10/16/2019 106  98 - 111 mmol/L Final  . CO2 10/16/2019 25  22 - 32 mmol/L Final  . Glucose, Bld 10/16/2019 104* 70 - 99 mg/dL Final   Glucose reference range applies only to samples taken after fasting for at least 8 hours.  . BUN 10/16/2019 10  6 - 20 mg/dL Final  . Creatinine, Ser 10/16/2019 0.90  0.44 - 1.00 mg/dL Final  . Calcium 10/16/2019 9.5  8.9 - 10.3 mg/dL Final  . Total Protein 10/16/2019 7.6  6.5 - 8.1 g/dL Final  . Albumin 10/16/2019 4.4  3.5 - 5.0 g/dL Final  . AST 10/16/2019 23  15 - 41 U/L Final  . ALT 10/16/2019 16  0 - 44 U/L Final  . Alkaline Phosphatase 10/16/2019 67  38 - 126 U/L Final  . Total Bilirubin 10/16/2019 0.5  0.3 - 1.2 mg/dL Final  . GFR calc non Af Amer 10/16/2019 >60  >60 mL/min Final  . GFR calc Af Amer 10/16/2019 >60  >60 mL/min Final  . Anion gap 10/16/2019 9  5 - 15 Final   Performed at [facility], [place] [address] [place], [place] [address]  . Hgb A1c MFr Bld 10/16/2019 5.4  4.8 - 5.6 % Final   Comment: (NOTE) Pre diabetes:  5.7%-6.4% Diabetes:              >6.4% Glycemic control for   <7.0% adults with diabetes   . Mean Plasma Glucose 10/16/2019 108.28  mg/dL Final   Performed at [place] [address], [place], [place] [address]  . Magnesium 10/16/2019 2.0  1.7 - 2.4 mg/dL Final   Performed at [place] [address] [place], [place] [address]  . Alcohol, Ethyl (B) 10/16/2019 <10  <10 mg/dL Final   Comment: (NOTE) Lowest detectable limit for serum alcohol is 10 mg/dL. For medical purposes only. Performed at [facility], [place] [address], [place], [place] [address]   . Cholesterol 10/16/2019 138  0 - 200 mg/dL Final  . Triglycerides 10/16/2019 60  <150 mg/dL Final  . HDL 10/16/2019 36* >40 mg/dL Final  . Total CHOL/HDL Ratio 10/16/2019 3.8  RATIO Final  . VLDL 10/16/2019 12  0 - 40 mg/dL Final  . LDL Cholesterol 10/16/2019 90  0 - 99 mg/dL Final   Comment:        Total Cholesterol/HDL:CHD Risk Coronary Heart Disease Risk Table                     Men   Women  1/2 Average Risk   3.4   3.3  Average Risk       5.0   4.4  2 X Average Risk   9.6   7.1  3 X Average Risk  23.4   11.0        Use the calculated Patient Ratio above and the CHD Risk Table to determine the patient's CHD Risk.        ATP III CLASSIFICATION (LDL):  <100     mg/dL   Optimal  100-129  mg/dL   Near or Above                    Optimal  130-159  mg/dL   Borderline  160-189  mg/dL   High  >190     mg/dL   Very High Performed at [place] [address], [place], [place] [address]   . Total Protein 10/16/2019 7.6  6.5 - 8.1 g/dL Final  . Albumin 10/16/2019 4.4  3.5 - 5.0 g/dL Final  . AST 10/16/2019 23  15 - 41 U/L Final  . ALT 10/16/2019 16  0 - 44 U/L Final  . Alkaline Phosphatase 10/16/2019 67  38 - 126 U/L Final  . Total Bilirubin 10/16/2019 0.5  0.3 - 1.2 mg/dL Final  . Bilirubin, Direct 10/16/2019 0.1  0.0 - 0.2 mg/dL Final  . Indirect Bilirubin 10/16/2019 0.4  0.3 - 0.9 mg/dL Final   Performed at [facility], [place] [address] [place], [place] [address]  . Prolactin 10/16/2019 152.0* 4.8 - 23.3 ng/mL Final   Comment: (NOTE) Performed At: [facility] [place], [place] [id number] Rush Farmer MD  XB:[phone]   . Color, Urine 10/16/2019 YELLOW  YELLOW Final  . APPearance 10/16/2019 HAZY* CLEAR Final  . Specific Gravity, Urine 10/16/2019 1.010  1.005 - 1.030 Final  . pH 10/16/2019 6.0  5.0 - 8.0 Final  . Glucose, UA 10/16/2019 NEGATIVE  NEGATIVE mg/dL Final  . Hgb urine dipstick 10/16/2019 SMALL* NEGATIVE Final  . Bilirubin Urine 10/16/2019 NEGATIVE  NEGATIVE Final  . Ketones, ur 10/16/2019 5* NEGATIVE mg/dL Final  . Protein, ur 10/16/2019 NEGATIVE  NEGATIVE mg/dL Final  . Nitrite 10/16/2019 NEGATIVE  NEGATIVE Final  . Chalmers Guest 10/16/2019 LARGE* NEGATIVE Final  . RBC / HPF 10/16/2019 11-20  0 - 5 RBC/hpf Final  . WBC, UA 10/16/2019 >50* 0 - 5 WBC/hpf Final  . Bacteria, UA 10/16/2019 RARE* NONE SEEN Final  . Squamous Epithelial / LPF 10/16/2019 0-5  0 - 5 Final  . Mucus 10/16/2019 PRESENT   Final  . Ca Oxalate Crys, UA 10/16/2019 PRESENT   Final   Performed at [place] [address] [place], [place] [address]  . Preg Test, Ur 10/16/2019 NEGATIVE  NEGATIVE Final   Comment:        THE SENSITIVITY OF THIS METHODOLOGY IS >20 mIU/mL. Performed at [facility], [place] [address] [place], [place] [address]   . Specimen Description 10/23/2019    Final                   Value:URINE, RANDOM Performed at [facility], [place] [address], [place], [place] [address]   . Special Requests 10/23/2019    Final                   Value:NONE Performed at [facility], [place] [address] [place], [place] [address]   . Culture 10/23/2019 >=100,000 COLONIES/mL STAPHYLOCOCCUS EPIDERMIDIS*  Final  . Report Status 10/23/2019 10/26/2019 FINAL   Final  . Organism ID, Bacteria 10/23/2019 STAPHYLOCOCCUS EPIDERMIDIS*  Final  . Color, Urine 11/02/2019 YELLOW  YELLOW Final  . APPearance 11/02/2019 CLOUDY* CLEAR Final  . Specific Gravity, Urine 11/02/2019 1.015  1.005 - 1.030 Final  . pH 11/02/2019 7.0  5.0 - 8.0 Final  .  Glucose, UA 11/02/2019 NEGATIVE  NEGATIVE mg/dL Final  . Hgb urine dipstick 11/02/2019 NEGATIVE  NEGATIVE Final  . Bilirubin Urine 11/02/2019 NEGATIVE  NEGATIVE Final  . Ketones, ur 11/02/2019 NEGATIVE  NEGATIVE mg/dL Final  . Protein, ur 11/02/2019 NEGATIVE  NEGATIVE mg/dL Final  . Nitrite 11/02/2019 NEGATIVE  NEGATIVE Final  . Chalmers Guest 11/02/2019 NEGATIVE  NEGATIVE Final  . RBC / HPF 11/02/2019 0-5  0 - 5 RBC/hpf Final  . WBC, UA 11/02/2019 0-5  0 - 5 WBC/hpf Final  . Bacteria, UA 11/02/2019 NONE SEEN  NONE SEEN Final  . Squamous Epithelial / LPF 11/02/2019 6-10  0 - 5 Final  . Mucus 11/02/2019 PRESENT   Final  . Amorphous Crystal 11/02/2019 PRESENT   Final   Performed at [facility], [place] [address], [place], [place] [address]  . Prolactin 11/11/2019 113.0* 4.8 - 23.3 ng/mL Final   Comment: (NOTE) Performed At: [facility] [place], [place] [id number] Rush Farmer MD OM:[id number]   . SARS Coronavirus 2 by RT PCR 11/11/2019 NEGATIVE  NEGATIVE Final   Comment: (NOTE) SARS-CoV-2 target nucleic acids are NOT DETECTED. The SARS-CoV-2 RNA is generally detectable in upper respiratoy specimens during the acute phase of infection. The lowest concentration of SARS-CoV-2 viral copies this assay can detect is 131 copies/mL. A negative result does not preclude SARS-Cov-2 infection and should not be used as the sole basis for treatment or other patient management decisions. A negative result may occur with  improper specimen collection/handling, submission of specimen other than nasopharyngeal swab, presence of viral mutation(s) within the areas targeted by this assay, and inadequate number of viral copies (<131 copies/mL). A negative result must be combined with clinical observations, patient history, and epidemiological information. The expected  result is Negative. Fact Sheet for Patients:  PinkCheek.be Fact  Sheet for Healthcare Providers:  GravelBags.it This test is not yet ap                          proved or cleared by the [place] FDA and  has been authorized for detection and/or diagnosis of SARS-CoV-2 by FDA under an Emergency Use Authorization (EUA). This EUA will remain  in effect (meaning this test can be used) for the duration of the COVID-19 declaration under Section 564(b)(1) of the Act, 21 U.S.C. section 360bbb-3(b)(1), unless the authorization is terminated or revoked sooner.   . Influenza A by PCR 11/11/2019 NEGATIVE  NEGATIVE Final  . Influenza B by PCR 11/11/2019 NEGATIVE  NEGATIVE Final   Comment: (NOTE) The Xpert Xpress SARS-CoV-2/FLU/RSV assay is intended as an aid in  the diagnosis of influenza from Nasopharyngeal swab specimens and  should not be used as a sole basis for treatment. Nasal washings and  aspirates are unacceptable for Xpert Xpress SARS-CoV-2/FLU/RSV  testing. Fact Sheet for Patients: PinkCheek.be Fact Sheet for Healthcare Providers: GravelBags.it This test is not yet approved or cleared by the [place] FDA and  has been authorized for detection and/or diagnosis of SARS-CoV-2 by  FDA under an Emergency Use Authorization (EUA). This EUA will remain  in effect (meaning this test can be used) for the duration of the  Covid-19 declaration under Section 564(b)(1) of the Act, 21  U.S.C. section 360bbb-3(b)(1), unless the authorization is  terminated or revoked. Performed at [facility], [place] [address], [place], [place] [address]     Allergies: Abilify [aripiprazole] and Zoloft [sertraline hcl]  PTA Medications: (Not in a hospital admission)   Medical Decision Making  Restarted medications from discharge from [facility] last week:  benztropine 2 MG tablet Commonly known as: COGENTIN Take 1 tablet (2 mg total) by mouth 2 (two) times daily as  needed for tremors (EPS).  Indication: Extrapyramidal Reaction caused by Medications   divalproex 500 MG DR tablet Commonly known as: DEPAKOTE Take 2 tablets (1,000 mg total) by mouth 2 (two) times daily. For mood stabilization  Indication: Mood stabilization   FLUoxetine 40 MG capsule Commonly known as: PROZAC Take 1 capsule (40 mg total) by mouth daily. For depression What changed:   medication strength  how much to take  additional instructions  Indication: Major Depressive Disorder   haloperidol 10 MG tablet Commonly known as: HALDOL Take 1 tablet (10 mg total) by mouth 2 (two) times daily. For mood control  Indication: Mood control   hydrOXYzine 50 MG tablet Commonly known as: ATARAX/VISTARIL Take 1 tablet (50 mg total) by mouth 3 (three) times daily as needed for anxiety.  Indication: Feeling Anxious   nicotine polacrilex 2 MG gum Commonly known as: NICORETTE Take 1 each (2 mg total) by mouth as needed. (May buy from over the counter): For smoking cessation  Indication: Nicotine Addiction   OLANZapine 5 MG tablet Commonly known as: ZYPREXA Take 1 tablet (5 mg total) by mouth daily. For mood control  Indication: Mood control   olanzapine zydis 15 MG disintegrating tablet Commonly known as: ZYPREXA Take 1 tablet (15 mg total) by mouth at bedtime. For mood control  Indication: Mood control   pantoprazole 40 MG tablet Commonly known as: PROTONIX Take 1 tablet (40 mg total) by mouth at bedtime. For acid rflux  Indication: Gastroesophageal Reflux Disease   propranolol 10 MG  tablet Commonly known as: INDERAL Take 1 tablet (10 mg total) by mouth 3 (three) times daily. For anxiety  Indication: Feeling Anxious   traZODone 100 MG tablet Commonly known as: DESYREL Take 1 tablet (100 mg total) by mouth at bedtime as needed for sleep.       Patient admitted to [facility] for overnight observation.    Recommendations  Based on my evaluation the  patient does not appear to have an emergency medical condition.  Connye Burkitt, NP 03/27/20  4:52 PM

## 2020-03-27 NOTE — ED Notes (Signed)
Patient is currently laying down watching television.

## 2020-03-27 NOTE — ED Notes (Signed)
Patient currently outside in courtyard interacting with other staff

## 2020-03-27 NOTE — ED Notes (Signed)
Pt transferred form [facility] hospital with c/o  SI thoughts and AVH. At this time pt is not experiencing any SI thoughts or AVH. She is calm and cooperative. Pt has went outside at this time with two MHT.

## 2020-03-28 ENCOUNTER — Other Ambulatory Visit: Payer: Self-pay

## 2020-03-28 MED ORDER — DIVALPROEX SODIUM 500 MG PO DR TAB
1000.0000 mg | DELAYED_RELEASE_TABLET | Freq: Two times a day (BID) | ORAL | 0 refills | Status: DC
Start: 2020-03-28 — End: 2020-05-09

## 2020-03-28 MED ORDER — OLANZAPINE 5 MG PO TABS: 5.0000 mg | ORAL_TABLET | Freq: Every day | ORAL | 0 refills | Status: DC

## 2020-03-28 MED ORDER — BENZTROPINE MESYLATE 2 MG PO TABS
2.0000 mg | ORAL_TABLET | Freq: Two times a day (BID) | ORAL | 0 refills | Status: DC | PRN
Start: 2020-03-28 — End: 2020-05-09

## 2020-03-28 MED ORDER — PANTOPRAZOLE SODIUM 40 MG PO TBEC
40.0000 mg | DELAYED_RELEASE_TABLET | Freq: Every day | ORAL | 0 refills | Status: DC
Start: 2020-03-28 — End: 2020-05-09

## 2020-03-28 MED ORDER — OLANZAPINE 15 MG PO TBDP: 15.0000 mg | ORAL_TABLET | Freq: Every day | ORAL | 0 refills | Status: DC

## 2020-03-28 MED ORDER — PROPRANOLOL HCL 10 MG PO TABS
10.0000 mg | ORAL_TABLET | Freq: Three times a day (TID) | ORAL | 0 refills | Status: DC
Start: 2020-03-28 — End: 2020-05-09

## 2020-03-28 MED ORDER — HYDROXYZINE HCL 50 MG PO TABS
50.0000 mg | ORAL_TABLET | Freq: Three times a day (TID) | ORAL | 0 refills | Status: DC | PRN
Start: 2020-03-28 — End: 2020-05-09

## 2020-03-28 MED ORDER — FLUOXETINE HCL 40 MG PO CAPS
40.0000 mg | ORAL_CAPSULE | Freq: Every day | ORAL | 0 refills | Status: DC
Start: 2020-03-29 — End: 2020-05-09

## 2020-03-28 MED ORDER — HALOPERIDOL 5 MG PO TABS
15.0000 mg | ORAL_TABLET | Freq: Two times a day (BID) | ORAL | 0 refills | Status: DC
Start: 2020-03-28 — End: 2020-05-09

## 2020-03-28 MED ORDER — NICOTINE POLACRILEX 2 MG MT GUM: 2.0000 mg | CHEWING_GUM | OROMUCOSAL | 0 refills | Status: DC | PRN

## 2020-03-28 MED ORDER — TRAZODONE HCL 100 MG PO TABS
100.0000 mg | ORAL_TABLET | Freq: Every evening | ORAL | 0 refills | Status: DC | PRN
Start: 2020-03-28 — End: 2020-05-09

## 2020-03-28 MED FILL — DIVALPROEX SOD DR 500 MG TA: 500 | 15 days supply | Qty: 60 | Fill #0

## 2020-03-28 MED FILL — FLUoxetine HCL 40 MG CAPS: 40 | 30 days supply | Qty: 30 | Fill #0

## 2020-03-28 MED FILL — OLANZapine 5 MG TABS: 5 | 30 days supply | Qty: 30 | Fill #0

## 2020-03-28 MED FILL — PANTOPRAZOLE SOD DR 40 MG T: 40 | 30 days supply | Qty: 30 | Fill #0

## 2020-03-28 MED FILL — hydrOXYzine HCL 50 MG TABS: 50 | 30 days supply | Qty: 90 | Fill #0

## 2020-03-28 MED FILL — HALOPERIDOL 5 MG TABS: 5 | 15 days supply | Qty: 90 | Fill #0

## 2020-03-28 MED FILL — TRAZODONE HCL 100 MG TABS: 100 | 15 days supply | Qty: 15 | Fill #0

## 2020-03-28 NOTE — Progress Notes (Signed)
CSW attempted to contact the patient's [place] team. CSW was directed to call the ACTT supervisor Roxy Horseman [phone]) to discuss potential discharge plans and disposition.   There was no answer at this time, CSW will continue to follow.      Radonna Ricker, MSW, LCSW Clinical Social Worker [place]

## 2020-03-28 NOTE — Patient Outreach (Signed)
ED Peer Support Specialist Patient Intake (Complete at intake & 30-60 Day Follow-up)  Name: Julia [name]  MRN: [id number]  Age: 19 y.o.   Date of Admission: 03/28/2020  Intake: Initial Comments:      Primary Reason Admitted: Paranoid schizophrenia    Lab values: Alcohol/ETOH: Negative Positive UDS? No Amphetamines: No Barbiturates: No Benzodiazepines: No Cocaine: No Opiates: No Cannabinoids: Yes  Demographic information: Gender: Female Ethnicity: White Marital Status: Single Insurance Status: Diplomatic Services operational officer (Work Neurosurgeon, Physicist, medical, etc.: No Lives with: Alone Living situation: Homeless  Reported Patient History: Patient reported health conditions: None Patient aware of HIV and hepatitis status: No  In past year, has patient visited ED for any reason? No  Number of ED visits:    Reason(s) for visit:    In past year, has patient been hospitalized for any reason? No  Number of hospitalizations:    Reason(s) for hospitalization:    In past year, has patient been arrested? Yes  Number of arrests: 2  Reason(s) for arrest: various reasons  In past year, has patient been incarcerated? No  Number of incarcerations:    Reason(s) for incarceration:    In past year, has patient received medication-assisted treatment?    In past year, patient received the following treatments: Other (comment)  In past year, has patient received any harm reduction services? No  Did this include any of the following?    In past year, has patient received care from a mental health provider for diagnosis other than SUD? No  In past year, is this first time patient has overdosed? No  Number of past overdoses:    In past year, is this first time patient has been hospitalized for an overdose? No  Number of hospitalizations for overdose(s):    Is patient currently receiving treatment for a mental health diagnosis? No  Patient  reports experiencing difficulty participating in SUD treatment: No    Most important reason(s) for this difficulty?    Has patient received prior services for treatment? No  In past, patient has received services from following agencies:    Plan of Care:  Suggested follow up at these agencies/treatment centers: Other (comment)  Other information: CPSS met with Pt an was able to gain information to better assist Pt. CPS was made aware that Pt was seeking assistance for housing at the time. Pt made CPSS aware that she has a job an that she was wanting out Patient services. CPSS will issue Pt information for housing in [facility].     Aaron Edelman Kiing Deakin, CPSS  03/28/2020 3:33 PM

## 2020-03-28 NOTE — ED Notes (Addendum)
Pt resting @this  time. Pt calm and cooperative. Breathing even and unlabored
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
## 2020-03-28 NOTE — Discharge Instructions (Addendum)
Activity as tolerated. Diet as recommended by primary care physician. Keep all scheduled follow-up appointments as recommended.  Please call to continue follow up with [name] team [phone]).

## 2020-03-28 NOTE — Progress Notes (Addendum)
Received Julia [name] this Am asleep in her chair bed, she continued to sleep until she was awaken for VS assessment and to talk with the NP. She ate  breakfast, rested then lunch. Shortly thereafter she started pacing quietly. She stated a little amount of voices at intervals throughout the day. This Probation officer informed her she can go out in the court yard later after the staff finish their lunch rounds. She stayed out in the court yard for about 5 min. Peer support consulted with her earlier.

## 2020-03-28 NOTE — ED Notes (Signed)
Patient pacing and responding to internal stimuli. Patient talking out loud to people who are not there. Patient given prn vistaril 50 mg for anxiety and Zyprexa 5 mg. Will monitor for results.

## 2020-03-28 NOTE — ED Notes (Signed)
Patient given meal; Salad, juice

## 2020-03-28 NOTE — ED Notes (Signed)
Pt sleeping @this  time. Pt is cooperative and calm. Breathing even and unlabored. Will continue to monitor pt

## 2020-03-28 NOTE — Progress Notes (Signed)
Julia [name] received her discharge order, she reviewed her AVS and her questions were answered. She called her dad to transport her home. She received her personal belongings. She was escorted to the lobby to wait for her dad.

## 2020-03-28 NOTE — Progress Notes (Signed)
CSW spoke with [facility] Team Lead, [name] [phone]). She reports that they have had the patient as a client for a little over a year, however they have not been successful in gaining the patient's compliance with follow up appointments, medications, and housing resources.   She shared that the patient has been placed in group homes, shelters and rescue missions and have not been compliant.   [name] recommended providing the patient with shelter resources and to notify [facility] of her discharge.    CSW will continue to follow.    Radonna Ricker, MSW, LCSW Clinical Social Worker [place]

## 2020-03-28 NOTE — ED Notes (Signed)
Pt resting in bed through night with with eyes closed, unlabored respirations & NAD.

## 2020-03-29 ENCOUNTER — Other Ambulatory Visit: Payer: Self-pay

## 2020-03-29 ENCOUNTER — Emergency Department (HOSPITAL_COMMUNITY)
Admission: EM | Admit: 2020-03-29 | Discharge: 2020-03-30 | Disposition: A | Discharge status: Other Acute Inpatient Hospital | Payer: Medicaid Other | Attending: Emergency Medicine | Admitting: Emergency Medicine

## 2020-03-29 ENCOUNTER — Encounter (HOSPITAL_COMMUNITY): Payer: Self-pay

## 2020-03-29 DIAGNOSIS — F200 Paranoid schizophrenia: Secondary | ICD-10-CM | POA: Insufficient documentation

## 2020-03-29 DIAGNOSIS — F1221 Cannabis dependence, in remission: Secondary | ICD-10-CM | POA: Diagnosis not present

## 2020-03-29 DIAGNOSIS — F17210 Nicotine dependence, cigarettes, uncomplicated: Secondary | ICD-10-CM | POA: Insufficient documentation

## 2020-03-29 DIAGNOSIS — F1590 Other stimulant use, unspecified, uncomplicated: Secondary | ICD-10-CM | POA: Insufficient documentation

## 2020-03-29 DIAGNOSIS — Z20822 Contact with and (suspected) exposure to covid-19: Secondary | ICD-10-CM | POA: Diagnosis not present

## 2020-03-29 DIAGNOSIS — Z79899 Other long term (current) drug therapy: Secondary | ICD-10-CM | POA: Insufficient documentation

## 2020-03-29 HISTORY — DX: Schizophrenia, unspecified: F20.9

## 2020-03-29 LAB — URINALYSIS, ROUTINE W REFLEX MICROSCOPIC
Bilirubin Urine: NEGATIVE
Glucose, UA: NEGATIVE mg/dL
Hgb urine dipstick: NEGATIVE
Ketones, ur: NEGATIVE mg/dL
Leukocytes,Ua: NEGATIVE
Nitrite: NEGATIVE
Protein, ur: NEGATIVE mg/dL
Specific Gravity, Urine: 1.006 (ref 1.005–1.030)
pH: 7 (ref 5.0–8.0)

## 2020-03-29 LAB — RAPID URINE DRUG SCREEN, HOSP PERFORMED
Amphetamines: NOT DETECTED
Barbiturates: NOT DETECTED
Benzodiazepines: NOT DETECTED
Cocaine: POSITIVE — AB
Opiates: NOT DETECTED
Tetrahydrocannabinol: POSITIVE — AB

## 2020-03-29 LAB — COMPREHENSIVE METABOLIC PANEL
ALT: 16 U/L (ref 0–44)
AST: 21 U/L (ref 15–41)
Albumin: 4 g/dL (ref 3.5–5.0)
Alkaline Phosphatase: 76 U/L (ref 38–126)
Anion gap: 10 (ref 5–15)
BUN: 12 mg/dL (ref 6–20)
CO2: 24 mmol/L (ref 22–32)
Calcium: 9.2 mg/dL (ref 8.9–10.3)
Chloride: 101 mmol/L (ref 98–111)
Creatinine, Ser: 0.82 mg/dL (ref 0.44–1.00)
GFR calc Af Amer: >60 mL/min (ref >60)
GFR calc non Af Amer: >60 mL/min (ref >60)
Glucose, Bld: 117 mg/dL — ABNORMAL HIGH (ref 70–99)
Potassium: 3.7 mmol/L (ref 3.5–5.1)
Sodium: 135 mmol/L (ref 135–145)
Total Bilirubin: 0.5 mg/dL (ref 0.3–1.2)
Total Protein: 7.7 g/dL (ref 6.5–8.1)

## 2020-03-29 LAB — CBC WITH DIFFERENTIAL/PLATELET
Abs Immature Granulocytes: 0.09 10*3/uL — ABNORMAL HIGH (ref 0.00–0.07)
Basophils Absolute: 0 10*3/uL (ref 0.0–0.1)
Basophils Relative: 1 %
Eosinophils Absolute: 0.1 10*3/uL (ref 0.0–0.5)
Eosinophils Relative: 1 %
HCT: 43 % (ref 36.0–46.0)
Hemoglobin: 13.7 g/dL (ref 12.0–15.0)
Immature Granulocytes: 1 %
Lymphocytes Relative: 28 %
Lymphs Abs: 2.3 10*3/uL (ref 0.7–4.0)
MCH: 28.5 pg (ref 26.0–34.0)
MCHC: 31.9 g/dL (ref 30.0–36.0)
MCV: 89.4 fL (ref 80.0–100.0)
Monocytes Absolute: 0.8 10*3/uL (ref 0.1–1.0)
Monocytes Relative: 9 %
Neutro Abs: 5.1 10*3/uL (ref 1.7–7.7)
Neutrophils Relative %: 60 %
Platelets: 264 10*3/uL (ref 150–400)
RBC: 4.81 MIL/uL (ref 3.87–5.11)
RDW: 14.7 % (ref 11.5–15.5)
WBC: 8.4 10*3/uL (ref 4.0–10.5)
nRBC: 0 % (ref 0.0–0.2)

## 2020-03-29 LAB — POC URINE PREG, ED: Preg Test, Ur: NEGATIVE

## 2020-03-29 LAB — ACETAMINOPHEN LEVEL: Acetaminophen (Tylenol), Serum: <10 ug/mL — ABNORMAL LOW (ref 10–30)

## 2020-03-29 LAB — SARS CORONAVIRUS 2 BY RT PCR (HOSPITAL ORDER, PERFORMED IN ~~LOC~~ HOSPITAL LAB): SARS Coronavirus 2: NEGATIVE

## 2020-03-29 LAB — ETHANOL: Alcohol, Ethyl (B): <10 mg/dL (ref <10)

## 2020-03-29 LAB — SALICYLATE LEVEL: Salicylate Lvl: <7 mg/dL — ABNORMAL LOW (ref 7.0–30.0)

## 2020-03-29 MED ORDER — HALOPERIDOL 5 MG PO TABS
15.0000 mg | ORAL_TABLET | Freq: Two times a day (BID) | ORAL | Status: DC
  Administered 2020-03-29 – 2020-03-30 (×3): 15 mg via ORAL
  Filled 2020-03-29 (×3): qty 3

## 2020-03-29 MED ORDER — OLANZAPINE 5 MG PO TABS
5.0000 mg | ORAL_TABLET | Freq: Every day | ORAL | Status: DC
  Administered 2020-03-29 – 2020-03-30 (×2): 5 mg via ORAL
  Filled 2020-03-29 (×2): qty 1

## 2020-03-29 MED ORDER — DIVALPROEX SODIUM 250 MG PO DR TAB
1000.0000 mg | DELAYED_RELEASE_TABLET | Freq: Two times a day (BID) | ORAL | Status: DC
  Administered 2020-03-29 – 2020-03-30 (×3): 1000 mg via ORAL
  Filled 2020-03-29 (×3): qty 4

## 2020-03-29 MED ORDER — BENZTROPINE MESYLATE 1 MG PO TABS: 2.0000 mg | ORAL_TABLET | Freq: Two times a day (BID) | ORAL | Status: DC | PRN

## 2020-03-29 MED ORDER — HYDROXYZINE HCL 25 MG PO TABS: 50.0000 mg | ORAL_TABLET | Freq: Three times a day (TID) | ORAL | Status: DC | PRN

## 2020-03-29 MED ORDER — NICOTINE POLACRILEX 2 MG MT GUM: 2.0000 mg | CHEWING_GUM | OROMUCOSAL | Status: DC | PRN

## 2020-03-29 MED ORDER — OLANZAPINE 5 MG PO TBDP: 15.0000 mg | ORAL_TABLET | Freq: Every day | ORAL | Status: DC

## 2020-03-29 MED ORDER — TRAZODONE HCL 50 MG PO TABS: 100.0000 mg | ORAL_TABLET | Freq: Every evening | ORAL | Status: DC | PRN

## 2020-03-29 MED FILL — BENZTROPINE MES 2 MG TABLET: 2 | 7 days supply | Qty: 15 | Fill #0

## 2020-03-29 NOTE — ED Notes (Signed)
Pt meal tray placed on bedside table. Pt resting at this time.

## 2020-03-29 NOTE — ED Notes (Signed)
Patient dressed out in purple scrubs. Security at bedside to wand her. Belongings were put on the bottom shelf. There is one patient bag and a bright pink book bag. Patient stickers were put on belongings.

## 2020-03-29 NOTE — BH Assessment (Signed)
Comprehensive Clinical Assessment (CCA) Note  03/29/2020 Julia [name] [id number]  Julia [name] is a 19 y.o female presenting to [facility] voluntarily for increased psychosis. Pt reports coming to the ED today due to "doomsday is coming, I'm trying to get into witness protection because they put a hit out on me, they (Illuminati) are going to kill me, I'm homeless and no one is listening or helping me". Pt reports smoking two blunts of marijuana daily and she uses about $20 worth of cocaine about once a month. Pt reports that her mother died, and she was missing her. Pt proceeds to look up to the sky and ask her mother to ask god for help. Pt reports auditory hallucinations of a female by the name of Julia [name] who is in a sex trafficking. Pt reports receiving ACTT services but is not meeting with her ACTT provider consistently. Pt has been to [facility] at least three times this month with similar presentation. Pt stated that she wants someone to listen to her and help her.  Pt is oriented x2, alert and engaged. Pt eye contact is fleeting at times and is vacant at other times. Pt is responding to internal stimuli and presents with persecutory delusions, thought blocking and auditory hallucinations. Pt presented tearful at times and yelled several times during assessment. Pt denied SI/HI and SIB  Visit Diagnosis:Schizophrenia, Cannabis use   Disposition: Per Dr. Dwyane Dee pt is recommended for inpatient treatment  CCA Screening, Triage and Referral (STR)  Patient Reported Information How did you hear about [place]? Self  Referral name: [place]  Referral phone number: No data recorded  Whom do you see for routine medical problems? Other (Comment) (UTA)  Practice/Facility Name: No data recorded Practice/Facility Phone Number: No data recorded Name of Contact: No data recorded Contact Number: No data recorded Contact Fax Number: No data recorded Prescriber Name: No data recorded Prescriber Address (if  known): No data recorded  What Is the Reason for Your Visit/Call Today? "Doomsday is coming and the they are out to kill me."  How Long Has This Been Causing You Problems? > than 6 months  What Do You Feel Would Help You the Most Today? Therapy;Medication   Have You Recently Been in Any Inpatient Treatment (Hospital/Detox/Crisis Center/28-Day Program)? No  Name/Location of Program/Hospital:No data recorded How Long Were You There? No data recorded When Were You Discharged? No data recorded  Have You Ever Received Services From [facility] Before? Yes  Who Do You [facility] at [facility]? [facility]   Have You Recently Had Any Thoughts About Hurting Yourself? No  Are You Planning to Commit Suicide/Harm Yourself At This time? No   Have you Recently Had Thoughts About [place]? No  Explanation: No data recorded  Have You Used Any Alcohol or Drugs in the Past 24 Hours? Yes  How Long Ago Did You Use Drugs or Alcohol? No data recorded What Did You Use and How Much? Marijuana about two blunts   Do You Currently Have a Therapist/Psychiatrist? Yes  Name of Therapist/Psychiatrist: Receiving ACTT services   Have You Been Recently Discharged From Any Office Practice or Programs? No  Explanation of Discharge From Practice/Program: No data recorded    CCA Screening Triage Referral Assessment Type of Contact: Tele-Assessment  Is this Initial or Reassessment? Reassessment  Date Telepsych consult ordered in [facility]:  No data recorded Time Telepsych consult ordered in [facility]:  No data recorded  Patient Reported Information Reviewed? Yes  Patient Left Without  Being Seen? No data recorded Reason for Not Completing Assessment: No data recorded  Collateral Involvement: Janiece from ACTT   Does Patient Have a [place]? No data recorded Name and Contact of Legal Guardian: pt  If Minor and Not Living with Parent(s), Who has Custody? Self  Is CPS involved or  ever been involved? Never  Is APS involved or ever been involved? Never   Patient Determined To Be At Risk for Harm To Self or Others Based on Review of Patient Reported Information or Presenting Complaint? No  Method: No data recorded Availability of Means: No data recorded Intent: No data recorded Notification Required: No data recorded Additional Information for Danger to Others Potential: No data recorded Additional Comments for Danger to Others Potential: No data recorded Are There Guns or Other Weapons in Your Home? No  Types of Guns/Weapons: No data recorded Are These Weapons Safely Secured?                            No data recorded Who Could Verify You Are Able To Have These Secured: No data recorded Do You Have any Outstanding Charges, Pending Court Dates, Parole/Probation? No data recorded Contacted To Inform of Risk of Harm To Self or Others: Other: Comment (ACTT)   Location of Assessment: [facility]   Does Patient Present under Involuntary Commitment? No  IVC Papers Initial File Date: No data recorded  [place] of Residence: [place]   Patient Currently Receiving the Following Services: ACTT Architect)   Determination of Need: Urgent (48 hours)   Options For Referral: Inpatient Hospitalization     CCA Biopsychosocial  Intake/Chief Complaint:  CCA Intake With Chief Complaint CCA Part Two Date: 03/16/20 CCA Part Two Time: 27 Chief Complaint/Presenting Problem: NA Patient's Currently Reported Symptoms/Problems: NA Individual's Strengths: NA Individual's Preferences: NA Individual's Abilities: NA Type of Services Patient Feels Are Needed: NA Initial Clinical Notes/Concerns: NA  Mental Health Symptoms Depression:  Depression: Difficulty Concentrating, Hopelessness, Irritability, Duration of symptoms greater than two weeks  Mania:  Mania: Racing thoughts, Irritability  Anxiety:   Anxiety: Difficulty concentrating, Sleep  Psychosis:   Psychosis: Affective flattening/alogia/avolition, Delusions, Grossly disorganized or catatonic behavior, Grossly disorganized speech, Hallucinations, Duration of symptoms greater than six months  Trauma:  Trauma: None  Obsessions:  Obsessions: None  Compulsions:  Compulsions: None  Inattention:  Inattention: None  Hyperactivity/Impulsivity:  Hyperactivity/Impulsivity: N/A  Oppositional/Defiant Behaviors:  Oppositional/Defiant Behaviors: N/A  Emotional Irregularity:  Emotional Irregularity: N/A  Other Mood/Personality Symptoms:  Other Mood/Personality Symptoms: NA   Mental Status Exam Appearance and self-care  Stature:  Stature: Average  Weight:  Weight: Overweight  Clothing:  Clothing: Casual  Grooming:  Grooming: Normal  Cosmetic use:  Cosmetic Use: None  Posture/gait:  Posture/Gait: Tense  Motor activity:  Motor Activity: Agitated, Restless  Sensorium  Attention:  Attention: Confused  Concentration:  Concentration: Anxiety interferes, Focuses on irrelevancies  Orientation:  Orientation: Object, Person, Place  Recall/memory:  Recall/Memory: Defective in Short-term  Affect and Mood  Affect:  Affect: Anxious, Labile  Mood:  Mood: Anxious, Hopeless  Relating  Eye contact:  Eye Contact: Fleeting  Facial expression:  Facial Expression: Anxious  Attitude toward examiner:  Attitude Toward Examiner: Cooperative, Guarded, Irritable  Thought and Language  Speech flow: Speech Flow: Blocked, Loud  Thought content:  Thought Content: Delusions  Preoccupation:  Preoccupations: None  Hallucinations:  Hallucinations: Auditory  Organization:     Executive  Functions  Fund of Knowledge:  Fund of Knowledge: Fair  Intelligence:  Intelligence: Average  Abstraction:  Abstraction: Psychologist, sport and exercise:  Judgement: Impaired  Reality Testing:  Reality Testing: Distorted  Insight:  Insight: Lacking  Decision Making:  Decision Making: Impulsive  Social Functioning  Social Maturity:  Social Maturity:  Irresponsible  Social Judgement:  Social Judgement: Normal  Stress  Stressors:  Stressors: Housing, Illness  Coping Ability:  Coping Ability: Research officer, political party Deficits:  Skill Deficits: Communication, Environmental health practitioner, Responsibility, Self-care  Supports:  Supports: Friends/Service system     Religion: Religion/Spirituality Are You A Religious Person?: No How Might This Affect Treatment?: NA  Leisure/Recreation: Leisure / Recreation Do You Have Hobbies?: No  Exercise/Diet: Exercise/Diet Do You Exercise?: No Have You Gained or Lost A Significant Amount of Weight in the Past Six Months?: No Do You Follow a Special Diet?: No Do You Have Any Trouble Sleeping?:  (UTA)   CCA Employment/Education  Employment/Work Situation: Employment / Work Situation Employment situation: Unemployed (Someone has done a disability application for her) Patient's job has been impacted by current illness: No What is the longest time patient has a held a job?: A few days Where was the patient employed at that time?: Restaurant Has patient ever been in the [employer]?: No  Education: Education Last Grade Completed: 12 Did Teacher, adult education From [employer]?: Yes Did Physicist, medical?: No Did Heritage manager?: No Did You Have An Individualized Education Program (IIEP): No Did You Have Any Difficulty At [employer]?: No   CCA Family/Childhood History  Family and Relationship History: Family history Marital status: Single Are you sexually active?: Yes What is your sexual orientation?: heterosexual Has your sexual activity been affected by drugs, alcohol, medication, or emotional stress?: n/a  Does patient have children?: No  Childhood History:  Childhood History By whom was/is the patient raised?: Both parents Additional childhood history information: both parents raised her; divorced when she was 19yo Description of patient's relationship with caregiver when they were a child: Close to  both growing up.  However, mother left when patient was 14yo to live in [place]. How were you disciplined when you got in trouble as a child/adolescent?: Items taken away Does patient have siblings?: No Did patient suffer any verbal/emotional/physical/sexual abuse as a child?: No Did patient suffer from severe childhood neglect?: No Has patient ever been sexually abused/assaulted/raped as an adolescent or adult?: No Was the patient ever a victim of a crime or a disaster?: No Witnessed domestic violence?: No Has patient been affected by domestic violence as an adult?: No  Child/Adolescent Assessment:     CCA Substance Use  Alcohol/Drug Use: Alcohol / Drug Use Pain Medications: See MAR Prescriptions: See MAR Over the Counter: See MAR History of alcohol / drug use?: Yes Substance #1 Name of Substance 1: Marijuana 1 - Age of First Use: 14 1 - Amount (size/oz): 2 blunts 1 - Frequency: daily 1 - Last Use / Amount: 03/28/2020 2 blunts Substance #2 Name of Substance 2: Cocaine 2 - Age of First Use: UTA 2 - Amount (size/oz): $20 2 - Frequency: about once a month 2 - Duration: UTA 2 - Last Use / Amount: UTA                     ASAM's:  Six Dimensions of Multidimensional Assessment  Dimension 1:  Acute Intoxication and/or Withdrawal Potential:      Dimension 2:  Biomedical Conditions and Complications:  Dimension 3:  Emotional, Behavioral, or Cognitive Conditions and Complications:     Dimension 4:  Readiness to Change:     Dimension 5:  Relapse, Continued use, or Continued Problem Potential:     Dimension 6:  Recovery/Living Environment:     ASAM Severity Score:    ASAM Recommended Level of Treatment:     Substance use Disorder (SUD)    Recommendations for Services/Supports/Treatments: Recommendations for Services/Supports/Treatments Recommendations For Services/Supports/Treatments: Inpatient Hospitalization  DSM5 Diagnoses: Patient Active Problem List    Diagnosis Date Noted  . Schizophrenia, paranoid ([place])   . Cocaine dependence with cocaine-induced psychotic disorder with complication ([place])   . Social anxiety disorder 09/25/2016  . Polysubstance abuse ([place])   . Dysmenorrhea 12/30/2013    Disposition: Per Dr. Dwyane Dee pt is recommended for inpatient treatment.    Jawann Urbani L [place]

## 2020-03-29 NOTE — ED Triage Notes (Signed)
Pt to er, pt states that she is here for her schizophrenia.  Asked if she had thoughts about hurting herself, pt then became tearful and said that she just can't think anymore.  Pt states that she see things that aren't there, but needs to pretend that she can't see them, states that she feels like someone is after her.  "every time I try to make my life better I keep getting shit on"

## 2020-03-29 NOTE — ED Provider Notes (Addendum)
[facility] Provider Note   CSN: [id number] Arrival date & time: 03/29/20  1239     History Chief Complaint  Patient presents with  . Psychiatric Evaluation    Julia [name] is a 19 y.o. female.  HPI [name] with a past medical history significant for paranoid schizophrenia and polysubstance abuse.  She is presented to the emergency department tearful and concerned that people are after her.  Patient states that she sees people around her.  She is also endorsing auditory hallucinations.  She states that she hears commands from [place].    Patient was discharged from behavioral health hospital yesterday she is well-known to the behavioral health hospital and that replacing her.  Patient states that she has not taken any medications since she left the behavioral hospital.  She is a history of medication noncompliance.  She states that the medications did not help her.  When asked about suicidal ideation she refuses any answer and stares into the distance.  Patient denies any homicidal ideation.  Does not answer questions about suicidal ideation and endorses both auditory and visual hallucinations.  On my review of EMR it appears that this is patient's baseline.  I called and discussed with [facility] provider Jenne Campus who recommended TTS consultation and placement in behavioral health hospital.    Past Medical History:  Diagnosis Date  . Burning with urination 05/03/2015  . Contraceptive management 05/03/2015  . Depression   . Dysmenorrhea 12/30/2013  . Heroin addiction ([place])   . Menorrhagia 12/30/2013  . Menstrual extraction 12/30/2013  . Migraines   . Schizophrenia ([place])   . Social anxiety disorder 09/25/2016  . Suicidal ideations   . Vaginal odor 05/03/2015    Patient Active Problem List   Diagnosis Date Noted  . Schizophrenia, paranoid ([place])   . Cocaine dependence with cocaine-induced psychotic disorder with complication ([place])   . Social  anxiety disorder 09/25/2016  . Polysubstance abuse ([place])   . Dysmenorrhea 12/30/2013    Past Surgical History:  Procedure Laterality Date  . NO PAST SURGERIES       OB History    Gravida  0   Para      Term      Preterm      AB      Living        SAB      TAB      Ectopic      Multiple      Live Births              Family History  Problem Relation Age of Onset  . Depression Mother   . Hypertension Father   . Hyperlipidemia Father   . Cancer Paternal Grandmother        breast, uterine  . Cirrhosis Paternal Grandfather        due to alcohol    Social History   Tobacco Use  . Smoking status: Current Every Day Smoker    Packs/day: 1.00    Types: Cigarettes  . Smokeless tobacco: Never Used  Vaping Use  . Vaping Use: Never used  Substance Use Topics  . Alcohol use: Yes  . Drug use: Yes    Types: Marijuana, Oxycodone, Cocaine    Home Medications Prior to Admission medications   Medication Sig Start Date End Date Taking? Authorizing Provider  benztropine (COGENTIN) 2 MG tablet Take 1 tablet (2 mg total) by mouth 2 (two) times daily as  needed for tremors (EPS). 03/28/20   Connye Burkitt, NP  busPIRone (BUSPAR) 5 MG tablet Take 5 mg by mouth 2 (two) times daily. 03/27/20   [provider]  divalproex (DEPAKOTE) 500 MG DR tablet Take 2 tablets (1,000 mg total) by mouth every 12 (twelve) hours. 03/28/20   Connye Burkitt, NP  FLUoxetine (PROZAC) 20 MG capsule Take 60 mg by mouth every morning. 03/27/20   [provider]  FLUoxetine (PROZAC) 40 MG capsule Take 1 capsule (40 mg total) by mouth daily. Patient not taking: Reported on 03/29/2020 03/29/20   Connye Burkitt, NP  haloperidol (HALDOL) 5 MG tablet Take 3 tablets (15 mg total) by mouth 2 (two) times daily. 03/28/20   Connye Burkitt, NP  hydrOXYzine (ATARAX/VISTARIL) 50 MG tablet Take 1 tablet (50 mg total) by mouth 3 (three) times daily as needed for anxiety. 03/28/20   Connye Burkitt, NP    nicotine polacrilex (NICORETTE) 2 MG gum Take 1 each (2 mg total) by mouth as needed. (May buy from over the counter): For smoking cessation 03/28/20   Connye Burkitt, NP  OLANZapine (ZYPREXA) 5 MG tablet Take 1 tablet (5 mg total) by mouth daily. For mood control 03/28/20   Connye Burkitt, NP  olanzapine zydis (ZYPREXA) 15 MG disintegrating tablet Take 1 tablet (15 mg total) by mouth at bedtime. For mood control 03/28/20   Connye Burkitt, NP  paliperidone (INVEGA) 3 MG 24 hr tablet Take 3 mg by mouth at bedtime. 03/27/20   [provider]  pantoprazole (PROTONIX) 40 MG tablet Take 1 tablet (40 mg total) by mouth at bedtime. For acid rflux 03/28/20   Connye Burkitt, NP  propranolol (INDERAL) 10 MG tablet Take 1 tablet (10 mg total) by mouth 3 (three) times daily. For anxiety 03/28/20   Connye Burkitt, NP  traZODone (DESYREL) 100 MG tablet Take 1 tablet (100 mg total) by mouth at bedtime as needed for sleep. 03/28/20   Connye Burkitt, NP    Allergies    Abilify [aripiprazole] and Zoloft [sertraline hcl]  Review of Systems   Review of Systems  Constitutional: Negative for chills and fever.  Respiratory: Negative for cough and shortness of breath.   Cardiovascular: Negative for chest pain and leg swelling.  Gastrointestinal: Negative for abdominal pain and vomiting.  Genitourinary: Negative for dysuria.  Neurological: Negative for dizziness and headaches.  Psychiatric/Behavioral: Positive for hallucinations and sleep disturbance. The patient is nervous/anxious.        Will not answer questions about SI    Physical Exam Updated Vital Signs BP 131/89 (BP Location: Right Arm) Comment: Simultaneous filing. User may not have seen previous data. Comment (BP Location): Simultaneous filing. User may not have seen previous data.  Pulse (!) 108 Comment: Simultaneous filing. User may not have seen previous data.  Temp 98.8 F (37.1 C) (Oral) Comment: Simultaneous filing. User may not have seen  previous data. Comment (Src): Simultaneous filing. User may not have seen previous data.  Resp 20 Comment: Simultaneous filing. User may not have seen previous data.  Ht 5\' 3"  (1.6 m)   Wt 77.1 kg   SpO2 97% Comment: Simultaneous filing. User may not have seen previous data.  BMI 30.11 kg/m   Physical Exam Vitals and nursing note reviewed.  Constitutional:      General: She is not in acute distress.    Appearance: Normal appearance. She is not ill-appearing.  HENT:  Head: Normocephalic and atraumatic.     Mouth/Throat:     Mouth: Mucous membranes are moist.  Eyes:     General: No scleral icterus.       Right eye: No discharge.        Left eye: No discharge.     Conjunctiva/sclera: Conjunctivae normal.  Pulmonary:     Effort: Pulmonary effort is normal.     Breath sounds: No stridor.  Abdominal:     General: Abdomen is flat.     Palpations: Abdomen is soft.     Tenderness: There is no abdominal tenderness. There is no right CVA tenderness or left CVA tenderness.  Neurological:     Mental Status: She is alert and oriented to person, place, and time. Mental status is at baseline.     Comments: Alert and oriented to self, place, time and event.   Speech is fluent, clear without dysarthria or dysphasia.   Strength 5/5 in upper/lower extremities  Sensation intact in upper/lower extremities   Normal gait.  Negative Romberg. No pronator drift.  Normal finger-to-nose and feet tapping.  CN I not tested  CN II grossly intact visual fields bilaterally. Did not visualize posterior eye.   CN III, IV, VI PERRLA and EOMs intact bilaterally  CN V Intact sensation to sharp and light touch to the face  CN VII facial movements symmetric  CN VIII not tested  CN IX, X no uvula deviation, symmetric rise of soft palate  CN XI 5/5 SCM and trapezius strength bilaterally  CN XII Midline tongue protrusion, symmetric L/R movements   Psychiatric:     Comments: Appears to be responding to  internal stimuli.  Very anxious, tearful at times.  Somewhat pressured speech.  Seems to have poor insight     ED Results / Procedures / Treatments   Labs (all labs ordered are listed, but only abnormal results are displayed) Labs Reviewed  COMPREHENSIVE METABOLIC PANEL - Abnormal; Notable for the following components:      Result Value   Glucose, Bld 117 (*)    All other components within normal limits  RAPID URINE DRUG SCREEN, HOSP PERFORMED - Abnormal; Notable for the following components:   Cocaine POSITIVE (*)    Tetrahydrocannabinol POSITIVE (*)    All other components within normal limits  CBC WITH DIFFERENTIAL/PLATELET - Abnormal; Notable for the following components:   Abs Immature Granulocytes 0.09 (*)    All other components within normal limits  ACETAMINOPHEN LEVEL - Abnormal; Notable for the following components:   Acetaminophen (Tylenol), Serum <10 (*)    All other components within normal limits  SALICYLATE LEVEL - Abnormal; Notable for the following components:   Salicylate Lvl <3.2 (*)    All other components within normal limits  URINALYSIS, ROUTINE W REFLEX MICROSCOPIC - Abnormal; Notable for the following components:   Color, Urine STRAW (*)    All other components within normal limits  SARS CORONAVIRUS 2 BY RT PCR (HOSPITAL ORDER, [facility])  ETHANOL  POC URINE PREG, ED    EKG None  Radiology No results found.  Procedures Procedures (including critical care time)  Medications Ordered in ED Medications  divalproex (DEPAKOTE) DR tablet 1,000 mg (1,000 mg Oral Given 03/29/20 1519)  haloperidol (HALDOL) tablet 15 mg (15 mg Oral Given 03/29/20 1520)  OLANZapine (ZYPREXA) tablet 5 mg (5 mg Oral Given 03/29/20 1520)  hydrOXYzine (ATARAX/VISTARIL) tablet 50 mg (has no administration in time range)  nicotine polacrilex (  NICORETTE) gum 2 mg (has no administration in time range)  benztropine (COGENTIN) tablet 2 mg (has no  administration in time range)    ED Course  I have reviewed the triage vital signs and the nursing notes.  Pertinent labs & imaging results that were available during my care of the patient were reviewed by me and considered in my medical decision making (see chart for details).  Patient is a paranoid schizophrenic not taking her medications was discharged from [facility] yesterday.  On physical exam patient is anxious responding to internal stimuli and tearful.  Poor insight.  Clinical Course as of Mar 29 1720  Wed Mar 29, 2020  1342 Discussed with Ledora Bottcher of behavioral health urgent care.  She assessed the patient yesterday when she was discharged. She will call back with recommendations from Dr. Dwyane Dee.    [WF]  1346 Received phone call back from [place] at behavioral health.  After discussing with Dr. Dwyane Dee recommendation is to place consult for TTS and they will recommend behavioral health hospitalization.   [WF]  1517 Salicylate and Tylenol level within normal limits.  Ethanol undetectable.  CBC without leukocytosis or anemia.  CMP without electrolyte abnormality or anion gap.  Urine pregnancy test was negative.  Urinalysis without abnormality or evidence of infection.  Urine drug screen positive for cocaine and THC which is consistent with patient's history.   [WF]    Clinical Course User Index [WF] Tedd Sias, PA   MDM Rules/Calculators/A&P                          Patient will be behavioral health hold until she is placed.  TTS consult is pending.  Lab work and clearance labs reviewed above.  Patient has had home medicines ordered by myself.  I reviewed these with NP Jenne Campus and I have prescribed/reordered patient's Depakote Haldol and Zyprexa.  Patient is voluntary at this time.  She does not have a history of leaving AMA.  She does decide to leave I believe that she would require IVC as she is experiencing a psychotic episode.  Final Clinical Impression(s) / ED  Diagnoses Final diagnoses:  Paranoid schizophrenia [facility])    Rx / DC Orders ED Discharge Orders    None       Tedd Sias, [place] 03/29/20 1721    Pati Gallo [place], [place] 03/29/20 1722    Milton Ferguson, MD 03/29/20 1729

## 2020-03-30 NOTE — ED Notes (Signed)
Called [facility] for transport to [facility] about 15 minutes ago.  They will call me when they have a driver in route.

## 2020-03-30 NOTE — BH Assessment (Signed)
Per Jonelle Sidle, patient accepted to [facility] (main campus) for admission 03/30/20. The accepting provider is Dr. Jonelle Sports. [phone].

## 2020-03-30 NOTE — ED Provider Notes (Signed)
Emergency Medicine Observation Re-evaluation Note  Julia [name] is a 19 y.o. female, seen on rounds today.  Pt initially presented to the ED for complaints of Psychiatric Evaluation Currently, the patient is resting comfortably, alert and communicative.  She states that "Elta [place] is after me, he is the antichrist."  She states this problem has been going on for years and she is talking to her providers with the assessment and crisis team about it.  She is vague about whether or not she uses medicine..  Physical Exam  BP 113/66 (BP Location: Right Arm)   Pulse 75   Temp 98.3 F (36.8 C) (Oral)   Resp 20   Ht 5\' 3"  (1.6 m)   Wt 77.1 kg   SpO2 100%   BMI 30.11 kg/m  Physical Exam General: Alert, communicative, appears sad Cardiac: Normal vital signs Lungs: No respiratory distress Psych: She reports delusion but does not appear to be responding to internal stimuli.  ED Course / MDM  EKG:  Clinical Course as of Mar 30 733  Wed Mar 29, 2020  1342 Discussed with Ledora Bottcher of behavioral health urgent care.  She assessed the patient yesterday when she was discharged. She will call back with recommendations from Dr. Dwyane Dee.    [WF]  1346 Received phone call back from [place] at behavioral health.  After discussing with Dr. Dwyane Dee recommendation is to place consult for TTS and they will recommend behavioral health hospitalization.   [WF]  4315 Salicylate and Tylenol level within normal limits.  Ethanol undetectable.  CBC without leukocytosis or anemia.  CMP without electrolyte abnormality or anion gap.  Urine pregnancy test was negative.  Urinalysis without abnormality or evidence of infection.  Urine drug screen positive for cocaine and THC which is consistent with patient's history.   [WF]    Clinical Course User Index [WF] Tedd Sias, PA   I have reviewed the labs performed to date as well as medications administered while in observation.  Recent changes in the last 24 hours include  she has been evaluated by TTS who plan on limiting her psychiatrically.  She remains voluntary.  Plan  Current plan is for psychiatric hospitalization. Patient is not under full IVC at this time.   Daleen Bo, MD 03/30/20 [phone]

## 2020-03-30 NOTE — ED Notes (Signed)
Attempted to give report, facility refused because pt had already left our facility

## 2020-03-30 NOTE — ED Notes (Signed)
Spoke with [employer] for ETA. Because of delay due to Approval of transfer they will be here in about 30-45 minutes.  Nurse informed.

## 2020-03-30 NOTE — BH Assessment (Signed)
Per Jonelle Sidle, patient accepted to [facility] (main campus) for admission 03/30/20. The accepting provider is Dr. Jonelle Sports. [phone].

## 2020-03-30 NOTE — Clinical Social Work Note (Signed)
Transition of Care [facility]) - Emergency Department Mini Assessment  Patient Details  Name: Julia [name] MRN: [id number] Date of Birth: 17-Aug-2000  Transition of Care [facility]) CM/SW Contact:    Sherie Don, LCSW Phone Number: 03/30/2020, 9:44 AM  Clinical Narrative: [name] who presented to the ED for psychosis. Patient meets inpatient criteria and has been admitted to [facility]. ED staff has called [facility]. CSW completed required transportation form and sent it to [employer]. TOC signing off.  ED Mini Assessment: What brought you to the Emergency Department? : Psychosis Barriers to Discharge: ED Psych evaluation, ED Barriers Resolved Barrier interventions: Patient admitted to [facility]; [facility] form completed Means of departure: Other (enter comment) Hospital doctor) Interventions which prevented an admission or readmission: Transportation Screening  Patient Adult nurse 1: [facility] Contact Date: 03/30/20     Call outcome: [facility] to pick up patient and take her to [facility]  Admission diagnosis:  SCHIZOPHRENIC Patient Active Problem List   Diagnosis Date Noted  . Schizophrenia, paranoid ([place])   . Cocaine dependence with cocaine-induced psychotic disorder with complication ([place])   . Social anxiety disorder 09/25/2016  . Polysubstance abuse ([place])   . Dysmenorrhea 12/30/2013   PCP:  Patient, No Pcp Per Pharmacy:   [id number] - [place], [address] [place] [address] Phone: [phone] Fax: [place], [place] [address] [place] [address] Phone: [phone] Fax: [phone]

## 2020-03-30 NOTE — ED Notes (Signed)
Belongings given to driver

## 2020-05-08 ENCOUNTER — Other Ambulatory Visit: Payer: Self-pay | Admitting: Behavioral Health

## 2020-05-08 ENCOUNTER — Encounter (HOSPITAL_COMMUNITY): Payer: Self-pay | Admitting: Behavioral Health

## 2020-05-08 ENCOUNTER — Other Ambulatory Visit: Payer: Self-pay

## 2020-05-08 ENCOUNTER — Inpatient Hospital Stay (HOSPITAL_COMMUNITY)
Admission: RE | Admit: 2020-05-08 | Discharge: 2020-05-17 | DRG: 885 | Disposition: A | Discharge status: Home / Self Care | Payer: Medicaid Other | Attending: Psychiatry | Admitting: Psychiatry

## 2020-05-08 DIAGNOSIS — Z5900 Homelessness unspecified: Secondary | ICD-10-CM

## 2020-05-08 DIAGNOSIS — F259 Schizoaffective disorder, unspecified: Secondary | ICD-10-CM | POA: Diagnosis present

## 2020-05-08 DIAGNOSIS — F251 Schizoaffective disorder, depressive type: Secondary | ICD-10-CM | POA: Diagnosis not present

## 2020-05-08 DIAGNOSIS — F1520 Other stimulant dependence, uncomplicated: Secondary | ICD-10-CM | POA: Diagnosis present

## 2020-05-08 DIAGNOSIS — G8929 Other chronic pain: Secondary | ICD-10-CM | POA: Diagnosis present

## 2020-05-08 DIAGNOSIS — F1210 Cannabis abuse, uncomplicated: Secondary | ICD-10-CM | POA: Diagnosis present

## 2020-05-08 DIAGNOSIS — Z818 Family history of other mental and behavioral disorders: Secondary | ICD-10-CM

## 2020-05-08 DIAGNOSIS — Z79899 Other long term (current) drug therapy: Secondary | ICD-10-CM

## 2020-05-08 DIAGNOSIS — Z20822 Contact with and (suspected) exposure to covid-19: Secondary | ICD-10-CM | POA: Diagnosis present

## 2020-05-08 DIAGNOSIS — Z9114 Patient's other noncompliance with medication regimen: Secondary | ICD-10-CM | POA: Diagnosis not present

## 2020-05-08 DIAGNOSIS — G4700 Insomnia, unspecified: Secondary | ICD-10-CM | POA: Diagnosis present

## 2020-05-08 DIAGNOSIS — F4010 Social phobia, unspecified: Secondary | ICD-10-CM | POA: Diagnosis present

## 2020-05-08 DIAGNOSIS — G2581 Restless legs syndrome: Secondary | ICD-10-CM | POA: Diagnosis present

## 2020-05-08 DIAGNOSIS — F1410 Cocaine abuse, uncomplicated: Secondary | ICD-10-CM | POA: Diagnosis present

## 2020-05-08 DIAGNOSIS — F17210 Nicotine dependence, cigarettes, uncomplicated: Secondary | ICD-10-CM | POA: Diagnosis present

## 2020-05-08 DIAGNOSIS — F250 Schizoaffective disorder, bipolar type: Principal | ICD-10-CM | POA: Diagnosis present

## 2020-05-08 DIAGNOSIS — F1910 Other psychoactive substance abuse, uncomplicated: Secondary | ICD-10-CM | POA: Diagnosis present

## 2020-05-08 LAB — RESPIRATORY PANEL BY RT PCR (FLU A&B, COVID)
Influenza A by PCR: NEGATIVE
Influenza B by PCR: NEGATIVE
SARS Coronavirus 2 by RT PCR: NEGATIVE

## 2020-05-08 MED ORDER — HALOPERIDOL 5 MG PO TABS: 5.0000 mg | ORAL_TABLET | Freq: Four times a day (QID) | ORAL | Status: DC | PRN

## 2020-05-08 MED ORDER — HALOPERIDOL 5 MG PO TABS
15.0000 mg | ORAL_TABLET | Freq: Two times a day (BID) | ORAL | Status: DC
  Administered 2020-05-08 – 2020-05-10 (×4): 15 mg via ORAL
  Filled 2020-05-08 (×6): qty 3

## 2020-05-08 MED ORDER — FLUOXETINE HCL 20 MG PO CAPS
40.0000 mg | ORAL_CAPSULE | Freq: Every day | ORAL | Status: DC
  Administered 2020-05-08 – 2020-05-17 (×10): 40 mg via ORAL
  Filled 2020-05-08 (×13): qty 2

## 2020-05-08 MED ORDER — OLANZAPINE 5 MG PO TBDP
15.0000 mg | ORAL_TABLET | Freq: Every day | ORAL | Status: DC
  Administered 2020-05-09 – 2020-05-12 (×4): 15 mg via ORAL
  Filled 2020-05-08 (×10): qty 3

## 2020-05-08 MED ORDER — OLANZAPINE 5 MG PO TABS
5.0000 mg | ORAL_TABLET | Freq: Every day | ORAL | Status: DC
  Administered 2020-05-08 – 2020-05-10 (×3): 5 mg via ORAL
  Filled 2020-05-08 (×4): qty 1

## 2020-05-08 MED ORDER — LORAZEPAM 1 MG PO TABS
1.0000 mg | ORAL_TABLET | Freq: Four times a day (QID) | ORAL | Status: AC | PRN
  Administered 2020-05-16: 1 mg via ORAL
  Filled 2020-05-08: qty 1

## 2020-05-08 MED ORDER — ZIPRASIDONE MESYLATE 20 MG IM SOLR
20.0000 mg | Freq: Four times a day (QID) | INTRAMUSCULAR | Status: AC | PRN
  Administered 2020-05-17: 20 mg via INTRAMUSCULAR
  Filled 2020-05-08: qty 20

## 2020-05-08 MED ORDER — OLANZAPINE 10 MG PO TBDP
10.0000 mg | ORAL_TABLET | Freq: Three times a day (TID) | ORAL | Status: DC | PRN
  Administered 2020-05-13 – 2020-05-17 (×3): 10 mg via ORAL
  Filled 2020-05-08 (×3): qty 1

## 2020-05-08 MED ORDER — ACETAMINOPHEN 325 MG PO TABS
650.0000 mg | ORAL_TABLET | Freq: Four times a day (QID) | ORAL | Status: DC | PRN
  Administered 2020-05-14: 650 mg via ORAL
  Filled 2020-05-08: qty 2

## 2020-05-08 MED ORDER — DIPHENHYDRAMINE HCL 50 MG/ML IJ SOLN
25.0000 mg | Freq: Once | INTRAMUSCULAR | Status: DC
  Filled 2020-05-08: qty 0.5
  Filled 2020-05-08: qty 1

## 2020-05-08 MED ORDER — LORAZEPAM 1 MG PO TABS: 1.0000 mg | ORAL_TABLET | Freq: Four times a day (QID) | ORAL | Status: DC | PRN

## 2020-05-08 MED ORDER — HYDROXYZINE HCL 50 MG PO TABS
50.0000 mg | ORAL_TABLET | Freq: Three times a day (TID) | ORAL | Status: DC | PRN
  Administered 2020-05-10 – 2020-05-17 (×5): 50 mg via ORAL
  Filled 2020-05-08 (×7): qty 1

## 2020-05-08 MED ORDER — TRAZODONE HCL 100 MG PO TABS
100.0000 mg | ORAL_TABLET | Freq: Every evening | ORAL | Status: DC | PRN
  Administered 2020-05-15 – 2020-05-16 (×2): 100 mg via ORAL
  Filled 2020-05-08 (×5): qty 1

## 2020-05-08 MED ORDER — BENZTROPINE MESYLATE 1 MG PO TABS
1.0000 mg | ORAL_TABLET | Freq: Four times a day (QID) | ORAL | Status: DC | PRN
  Administered 2020-05-09: 1 mg via ORAL
  Filled 2020-05-08: qty 1

## 2020-05-08 MED ORDER — PROPRANOLOL HCL 10 MG PO TABS
10.0000 mg | ORAL_TABLET | Freq: Three times a day (TID) | ORAL | Status: DC
  Administered 2020-05-08 – 2020-05-11 (×8): 10 mg via ORAL
  Filled 2020-05-08 (×12): qty 1

## 2020-05-08 MED ORDER — LORAZEPAM 2 MG/ML IJ SOLN
2.0000 mg | Freq: Once | INTRAMUSCULAR | Status: AC
  Administered 2020-05-17: 2 mg via INTRAMUSCULAR
  Filled 2020-05-08: qty 1

## 2020-05-08 MED ORDER — BENZTROPINE MESYLATE 1 MG PO TABS
2.0000 mg | ORAL_TABLET | Freq: Two times a day (BID) | ORAL | Status: DC | PRN
  Administered 2020-05-11: 2 mg via ORAL
  Filled 2020-05-08: qty 2

## 2020-05-08 MED ORDER — PANTOPRAZOLE SODIUM 40 MG PO TBEC
40.0000 mg | DELAYED_RELEASE_TABLET | Freq: Every day | ORAL | Status: DC
  Administered 2020-05-09 – 2020-05-16 (×7): 40 mg via ORAL
  Filled 2020-05-08 (×12): qty 1

## 2020-05-08 MED ORDER — HALOPERIDOL LACTATE 5 MG/ML IJ SOLN
10.0000 mg | Freq: Once | INTRAMUSCULAR | Status: DC
  Filled 2020-05-08: qty 2

## 2020-05-08 MED ORDER — DIVALPROEX SODIUM 500 MG PO DR TAB
1000.0000 mg | DELAYED_RELEASE_TABLET | Freq: Two times a day (BID) | ORAL | Status: DC
  Administered 2020-05-08 – 2020-05-11 (×6): 1000 mg via ORAL
  Filled 2020-05-08 (×10): qty 2

## 2020-05-08 NOTE — BH Assessment (Addendum)
Assessment Note  Julia [name] is an 19 y.o. female with history of depression, Schizophrenia, Social Anxiety Disorder, and Substance Use.  She presents to [facility] as a walk-in, voluntarily. She did not provide any response when asked about mode of transportation and/or if she was referred by someone in the community. Upon assessing patient it was clear that she was experiencing thought blocking. Also, a poor historian providing minimal responses and insight on her reason for presenting to [facility].   When asked what brought her to [facility] she stated, "I know to much" and "The God and devil are messing with my mind". When asked about the onset of her symptoms she states, "For a while". She does acknowledge hearing voices of "different people". She has visual hallucinations of "symbols". Patient denies feeling paranoid. She referenced that the "Julia [name] child.Marland KitchenMarland KitchenMarland KitchenThe devil is trying to get me".   She did not confirm and/or deny suicidal ideations. She starred at this clinician without providing a response when this question was asked. Upon chart review, patient reports 1 prior suicide attempt at 19 years old when she cut her wrists.  Patient later acknowledge that she has tried to cut herself on 1 occasion. Her episode of cutting was unclear to be a suicide attempt and/or self mutilating behaviors. Depressive noted include irritability, anger, and wanting to be alone. She did not provide any information regarding anxiety or appetite. States that she has not slept in 4 days. She has a history of emotional abuse. She did not acknowledge any supports.   Patient did not confirm and/or deny homicidal ideations. She did not confirm and/or deny if she has a history of violent and/or aggressive behaviors. Denies legal issues. Patient has a history of substance use including: cocaine and methamphetamine.   Patient has a history of polysubstance use and accidental overdosing, per chart review.  Patient has a history of heroin,  cocaine, Xanax, and THC use. Today she only reports use of THC and last use was 2-3 days ago. She denies withdrawal symptoms.   Patient has a current ACTT provider [facility]). She denies medication compliance x1 month. Subsequently she has been discharged from [facility] this long. States, "My medications were not right at discharge and that is why I didn't take them".  States that she has been hospitalized 9-10 times in the past.    Patient is alert and oriented x 4. She is dressed appropriately and tearful during points in assessment. Her speech is slow, eye contact is blunted, and she has symptoms of thought blocking. Her mood is labile and affect is congruent. Patient's insight, judgement, and impulse control are impaired. Patient does appear to be responding to internal stimuli or experiencing delusional thought content.  Diagnosis: Schizophrenia, Substance Induced Psychosis, Substance Use Disorder  Past Medical History:  Past Medical History:  Diagnosis Date  . Burning with urination 05/03/2015  . Contraceptive management 05/03/2015  . Depression   . Dysmenorrhea 12/30/2013  . Heroin addiction ([place])   . Menorrhagia 12/30/2013  . Menstrual extraction 12/30/2013  . Migraines   . Schizophrenia ([place])   . Social anxiety disorder 09/25/2016  . Suicidal ideations   . Vaginal odor 05/03/2015    Past Surgical History:  Procedure Laterality Date  . NO PAST SURGERIES      Family History:  Family History  Problem Relation Age of Onset  . Depression Mother   . Hypertension Father   . Hyperlipidemia Father   . Cancer Paternal Grandmother  breast, uterine  . Cirrhosis Paternal Grandfather        due to alcohol    Social History:  reports that she has been smoking cigarettes. She has been smoking about 1.00 pack per day. She has never used smokeless tobacco. She reports current alcohol use. She reports current drug use. Drugs: Marijuana, Oxycodone, and Cocaine.  Additional Social  History:  Alcohol / Drug Use Pain Medications: See MAR Prescriptions: See MAR Over the Counter: See MAR History of alcohol / drug use?: Yes Substance #1 Name of Substance 1: Cannabis 1 - Age of First Use: 14 1 - Amount (size/oz): Varies 1 - Frequency: Varies 1 - Duration: Ongoing 1 - Last Use / Amount: "3-4 days ago"  CIWA:   COWS:    Allergies:  Allergies  Allergen Reactions  . Abilify [Aripiprazole]   . Zoloft [Sertraline Hcl]     Home Medications:  Medications Prior to Admission  Medication Sig Dispense Refill  . benztropine (COGENTIN) 2 MG tablet Take 1 tablet (2 mg total) by mouth 2 (two) times daily as needed for tremors (EPS). 15 tablet 0  . busPIRone (BUSPAR) 5 MG tablet Take 5 mg by mouth 2 (two) times daily.    . divalproex (DEPAKOTE) 500 MG DR tablet Take 2 tablets (1,000 mg total) by mouth every 12 (twelve) hours. 60 tablet 0  . FLUoxetine (PROZAC) 20 MG capsule Take 60 mg by mouth every morning.    [name] FLUoxetine (PROZAC) 40 MG capsule Take 1 capsule (40 mg total) by mouth daily. (Patient not taking: Reported on 03/29/2020) 30 capsule 0  . haloperidol (HALDOL) 5 MG tablet Take 3 tablets (15 mg total) by mouth 2 (two) times daily. 90 tablet 0  . hydrOXYzine (ATARAX/VISTARIL) 50 MG tablet Take 1 tablet (50 mg total) by mouth 3 (three) times daily as needed for anxiety. 90 tablet 0  . nicotine polacrilex (NICORETTE) 2 MG gum Take 1 each (2 mg total) by mouth as needed. (May buy from over the counter): For smoking cessation 100 tablet 0  . OLANZapine (ZYPREXA) 5 MG tablet Take 1 tablet (5 mg total) by mouth daily. For mood control 30 tablet 0  . olanzapine zydis (ZYPREXA) 15 MG disintegrating tablet Take 1 tablet (15 mg total) by mouth at bedtime. For mood control 30 tablet 0  . paliperidone (INVEGA) 3 MG 24 hr tablet Take 3 mg by mouth at bedtime.    . pantoprazole (PROTONIX) 40 MG tablet Take 1 tablet (40 mg total) by mouth at bedtime. For acid rflux 30 tablet 0  .  propranolol (INDERAL) 10 MG tablet Take 1 tablet (10 mg total) by mouth 3 (three) times daily. For anxiety 45 tablet 0  . traZODone (DESYREL) 100 MG tablet Take 1 tablet (100 mg total) by mouth at bedtime as needed for sleep. 15 tablet 0    OB/GYN Status:  No LMP recorded.  General Assessment Data TTS Assessment: In system Is this a Tele or Face-to-Face Assessment?: Face-to-Face Is this an Initial Assessment or a Re-assessment for this encounter?: Initial Assessment Patient Accompanied by:: N/A Language Other than English: No Living Arrangements: Other (Comment) (Homeless) What gender do you identify as?: Female Marital status: Single Pregnancy Status: No Living Arrangements: Other (Comment) (Homeless) Can pt return to current living arrangement?: Yes Admission Status: Voluntary Is patient capable of signing voluntary admission?: Yes Referral Source: Self/Family/Friend Insurance type:  (Medicaid )     Crisis Care Plan Living Arrangements: Other (Comment) (Homeless) Legal Guardian:  (  no legal guardian) Name of Psychiatrist: [name] Name of Therapist: [name]  Education Status Is the patient employed, unemployed or receiving disability?: Unemployed  Risk to self with the past 6 months Suicidal Ideation:  (patient did not confirm nor deny ) Has patient been a risk to self within the past 6 months prior to admission? :  (patient did not confirm nor deny ) Suicidal Intent:  (patient did not confirm nor deny ) Has patient had any suicidal intent within the past 6 months prior to admission? : No (patient did not confirm nor deny ) Is patient at risk for suicide?:  (patient did not confirm nor deny ) Has patient had any suicidal plan within the past 6 months prior to admission? : No Access to Means:  (patient did not confirm nor deny ) Previous Attempts/Gestures: No How many times?:  (0) Other Self Harm Risks:  (off medications since discharge from [facility]) Triggers for  Past Attempts:  (NA) Intentional Self Injurious Behavior: None Family Suicide History: No Recent stressful life event(s):  (off medications ) Persecutory voices/beliefs?: No Depression: No Depression Symptoms:  (UTA) Substance abuse history and/or treatment for substance abuse?: No Suicide prevention information given to non-admitted patients: Not applicable  Risk to Others within the past 6 months Homicidal Ideation:  (patient did not confirm nor deny ) Does patient have any lifetime risk of violence toward others beyond the six months prior to admission? : Unknown Thoughts of Harm to Others:  (patient did not confirm nor deny ) Current Homicidal Intent:  (patient did not confirm nor deny ) Current Homicidal Plan:  (patient did not confirm nor deny ) Access to Homicidal Means:  (unk to patient's current mental state ) Identified Victim:  (patient did not confirm nor deny ) History of harm to others?: No Assessment of Violence: None Noted Violent Behavior Description:  (currently calm and cooperative ) Does patient have access to weapons?: No Criminal Charges Pending?: No Does patient have a court date: No Is patient on probation?: No  Psychosis Hallucinations: Auditory, Visual Delusions: None noted  Mental Status Report Appearance/Hygiene: Unremarkable Eye Contact: Poor Motor Activity: Agitation Speech: Pressured Level of Consciousness: Irritable Mood: Anxious Affect: Angry Anxiety Level: Moderate Thought Processes: Flight of Ideas Judgement: Partial Orientation: Unable to assess Obsessive Compulsive Thoughts/Behaviors: Unable to Assess  Cognitive Functioning Concentration: Decreased Memory: Recent Impaired, Remote Impaired Is patient IDD: No Insight: Poor Impulse Control: Unable to Assess Appetite:  (UTA) Have you had any weight changes? :  (unk) Sleep: Decreased Total Hours of Sleep:  ("I've been up for 4 days") Vegetative Symptoms: None  ADLScreening [facility]  Assessment Services) Patient's cognitive ability adequate to safely complete daily activities?: Yes Patient able to express need for assistance with ADLs?: Yes Independently performs ADLs?: Yes (appropriate for developmental age)  Prior Inpatient Therapy Prior Inpatient Therapy: Yes Prior Therapy Dates: 2021 Prior Therapy Facilty/Provider(s): [facility] Reason for Treatment: MH issues  Prior Outpatient Therapy Prior Outpatient Therapy: Yes Prior Therapy Dates: Ongoing  Prior Therapy Facilty/Provider(s): [facility]  Reason for Treatment: Med mang Does patient have an ACCT team?: No Does patient have Intensive In-House Services?  : No Does patient have [facility] services? : No Does patient have P4CC services?: No  ADL Screening (condition at time of admission) Patient's cognitive ability adequate to safely complete daily activities?: Yes Patient able to express need for assistance with ADLs?: Yes Independently performs ADLs?: Yes (appropriate for developmental age)       Abuse/Neglect  Assessment (Assessment to be complete while patient is alone) Abuse/Neglect Assessment Can Be Completed: Yes Physical Abuse: Denies Verbal Abuse: Denies Sexual Abuse: Denies Exploitation of patient/patient's resources: Denies Self-Neglect: Denies     Advance Directives (For Healthcare) Does Patient Have a Medical Advance Directive?: No          Disposition: Per Mordecai Maes, NP, patient meets criteria for INPT. Admit to [facility] bed 302-1.  Disposition Disposition of Patient: Admit Type of inpatient treatment program: Adult (Per Mordecai Maes, NP, patient meets criteria for INPT)  On Site Evaluation by:   Reviewed with Physician:    Waldon Merl 05/08/2020 2:14 PM

## 2020-05-08 NOTE — H&P (Signed)
Behavioral Health Medical Screening Exam  Julia [name] is an 19 y.o. female.who presented to [facility] as a walk-in, voluntarily. Per review of chart, patient psychiatric history is significant for multiple psychiatric hospitalizations within our system.  Within the last 12 months, per chart review,  she has been admitted 4 times and her most recent admission was 03/16/2020. Per review of chart, she has been admitted at [facility] as well and she has a history of methamphetamine and cocaine  dependency and she has a diagnosis of schizophrenia with possible onset secondary to her methamphetamine dependence.  She has also been previously diagnosed with schizoaffective disorder.   Patient presents today as very disorganized with significant thoughts blocking. She endorsed auditory hallucinations described as," God and the devil are messing with my mind. I hear people. I hear symbols and see them." She endorsed feeling paranoid and stated," the devil is trying to get me. The Rothschilds are trying to get me." At times, she seems selective mute although this may be related to the significance of her thought blocking. She admitted to using cocaine yesterday, " a little bit." She admitted to smoking mariajuana with last use, " 3-4 days ago. "She denied other recent substance abuse or use though again, she has a  of methamphetamine use. She reported that following her discharge from [facility], she did not take her medications and did not follow-up with outpatient psychiatric services.Per review pof chart, she has been followed by  [name] in the past. She denied SI. She did not repsond when asked about  HI. She stated that she has been living from house to house but not on the street. Stated that she has not slept but was unable to describe her sleeping pattern any further due to level of disorganized thought. Reported she was brought to [facility] by her father and gave verbal consent to speak to  father, Aryiah Monterosso, [phone] and following to attempts to reach father, he could not be reached. Prior to the evaluation ending patient yelled," I just need some Xanax, there I fucking said it. Sh laughed slightly then stared off into space. "    Per review of chart,following her discharge from [facility] 03/22/2020, her discharge medications included benztropine 2 mg by mouth twice a day as needed for EPS, Depakote 1000 mg by mouth daily for mood stabilization, Prozac capsule 40 mg by mouth daily for depression, haldol 10 mg by mouth twice a day for mood control, Vistaril 50 mg by mouth three times per day as needed for anxiety. Zyprexa 5 mg by mouth daily for mood control, Zyprexa Zydis  (disentagrating tablet) 15 mg by mouth daily at bedtime, Trazodone 100 mg by mouth daily at bedtime as needed, propranolol 10 mg by mouth three time per day as needed for anxiety.   Total Time spent with patient: 20 minutes  Psychiatric Specialty Exam: Physical Exam Review of Systems There were no vitals taken for this visit.There is no height or weight on file to calculate BMI. General Appearance: Disheveled Eye Contact:  Fair Speech:  Clear and Coherent and Slow Volume:  Normal Mood:  Anxious Affect:  Restricted Thought Process:  Disorganized and Descriptions of Associations: Intact; though blocking  Orientation:  Full (Time, Place, and Person) Thought Content:  Hallucinations: Auditory Visual and Paranoid Ideation Suicidal Thoughts:  No Homicidal Thoughts:  did not repsond to question Memory:  Immediate;   Fair Recent;   Fair Judgement:  Poor Insight:  Fair Psychomotor  Activity:  Decreased Concentration: Concentration: Fair and Attention Span: Fair Recall:  [place]: Fair Akathisia:  No Handed:  Right AIMS (if indicated):    Assets:  Desire for Improvement Sleep:     Musculoskeletal: Strength & Muscle Tone: within normal limits Gait & Station: normal Patient  leans: N/A  There were no vitals taken for this visit.  Recommendations: Patient presents with disorganized thought, significant thought blocking,  very anxious and paranoid. Her history  is significant formethamphetamine and cocaine  dependency and she has a diagnosis of schizophrenia with possible onset secondary to her methamphetamine dependence.  She has also been previously diagnosed with schizoaffective disorder. Reported following her discharge from [facility] 03/22/2020, she has not taken her previously prescribed psychotropic medications nor has she followed up with outpatient psychiatric services. She denied any recent methamphetamine use although admitted to using cocaine last night and admitted to using [place] with last use three-four days ago. At this time, inpatient psychiatric admission is recommended.   Patient assigned bed 502-01 here at [facility]. Her admission is pending negative COVID which has been ordered.   Mordecai Maes, NP 05/08/2020, 12:55 PM

## 2020-05-08 NOTE — Progress Notes (Signed)
Pt has been sleep majority of the evening due to getting medications earlier.     05/08/20 2000  Psych Admission Type (Psych Patients Only)  Admission Status Voluntary  Psychosocial Assessment  Patient Complaints None  Eye Contact Brief  Facial Expression Angry  Affect Angry;Threatening  Speech Soft;Slow;Pressured  Interaction Assertive;Minimal  Motor Activity Slow  Appearance/Hygiene Unremarkable  Thought Process  Coherency Blocking  Content Preoccupation  Delusions Paranoid  Perception Hallucinations  Hallucination Auditory  Judgment Impaired  Confusion None  Danger to Self  Current suicidal ideation? Denies  Danger to Others  Danger to Others None reported or observed

## 2020-05-08 NOTE — Progress Notes (Signed)
Admission Note: [name] who present as a walk-in to [facility] for auditory hallucination and substance abuse.  Patient was minimal and appears to be experiencing some thought blocking.  Presents with a flat affect and irritable mood.  States she is here to find a place to stay and to reduce her stress.  Admission plan of care reviewed and consent signed.  Skin assessment and personal belongings searched and completed.  Skin is dry and intact.  No contraband found.  Patient oriented to the unit, staff and room.  Routine safety checks initiated.  Patient started yelling upon getting to her room.  Medication given as prescribed.  Patient is safe on the unit.

## 2020-05-08 NOTE — [facility] Suicide Risk Assessment (Signed)
[facility] Admission Suicide Risk Assessment   Nursing information obtained from:    Demographic factors:    Current Mental Status:    Loss Factors:    Historical Factors:    Risk Reduction Factors:     Total Time spent with patient: 30 minutes Principal Problem: <principal problem not specified> Diagnosis:  Active Problems:   Schizoaffective disorder (HCC)  Subjective Data: Patient is seen and examined.  [name] familiar to me from multiple hospitalizations at the [facility] behavioral health system both at the behavioral health hospital as well as [facility] who presented to the behavioral health hospital as a walk-in patient.  She has a past psychiatric history significant for schizophrenia/schizoaffective disorder as well as polysubstance use disorders.  Her primary substances of choice include methamphetamines as well as cocaine.  During her previous hospitalization she has been on multiple medications, referred to group homes, referred to shelters, and had ACTT services of which she always seems to be noncompliant with follow-up.  She was seen and was clearly altered.  She was paranoid and agitated.  When she arrived on the unit she began to yell.  This is basically her baseline.  She is very paranoid at times.  She admitted that she had used cocaine as well as some other unspecified substance.  She stated that she remains homeless.  She stated that when she had attempted to go to live with her boyfriend it "did not work out".  She is clearly responding to internal stimuli.  She was admitted to the hospital for evaluation and stabilization.  Continued Clinical Symptoms:    The "Alcohol Use Disorders Identification Test", Guidelines for Use in Primary Care, Second Edition.  World Pharmacologist [facility]). Score between 0-7:  no or low risk or alcohol related problems. Score between 8-15:  moderate risk of alcohol related problems. Score between 16-19:   high risk of alcohol related problems. Score 20 or above:  warrants further diagnostic evaluation for alcohol dependence and treatment.   CLINICAL FACTORS:   Alcohol/Substance Abuse/Dependencies Schizophrenia:   Less than 77 years old Paranoid or undifferentiated type   Musculoskeletal: Strength & Muscle Tone: within normal limits Gait & Station: normal Patient leans: N/A  Psychiatric Specialty Exam: Physical Exam Vitals and nursing note reviewed.  HENT:     Head: Normocephalic and atraumatic.  Pulmonary:     Effort: Pulmonary effort is normal.  Neurological:     General: No focal deficit present.     Mental Status: She is alert.     Review of Systems  There were no vitals taken for this visit.There is no height or weight on file to calculate BMI.  General Appearance: Disheveled  Eye Contact:  Fair  Speech:  Pressured  Volume:  Increased  Mood:  Anxious, Dysphoric and Irritable  Affect:  Labile  Thought Process:  Disorganized and Descriptions of Associations: Loose  Orientation:  Negative  Thought Content:  Delusions, Paranoid Ideation and Rumination  Suicidal Thoughts:  No  Homicidal Thoughts:  No  Memory:  Immediate;   Poor Recent;   Poor Remote;   Poor  Judgement:  Impaired  Insight:  Lacking  Psychomotor Activity:  Increased  Concentration:  Concentration: Fair and Attention Span: Fair  Recall:  [employer] of Knowledge:  Poor  Language:  Fair  Akathisia:  Negative  Handed:  Right  AIMS (if indicated):     Assets:  Desire for Improvement Resilience  ADL's:  Intact  Cognition:  WNL  Sleep:         COGNITIVE FEATURES THAT CONTRIBUTE TO RISK:  Thought constriction (tunnel vision)    SUICIDE RISK:   Moderate:  Frequent suicidal ideation with limited intensity, and duration, some specificity in terms of plans, no associated intent, good self-control, limited dysphoria/symptomatology, some risk factors present, and identifiable protective factors,  including available and accessible social support.  PLAN OF CARE: Patient is seen and examined.  [name] familiar to me from multiple psychiatric hospitalizations before.  She will be admitted to the hospital.  She will be integrated in the milieu.  She will be encouraged to attend groups.  Given her history of noncompliance we are basically going to restart her medications that she had on her previous admission.  With regard to her Prozac we will decrease that to 20 mg a day just in case she had been noncompliant with it, and will increase it during the course of the hospitalization if necessary.  We will continue her Haldol at 15 mg p.o. twice daily, her Depakote DR to 1000 mg p.o. every 12 hours.  She has required multiple antipsychotics to control her agitation over time.  She has been on the paliperidone long-acting injectable medication as well.  Most recently she was on the Zyprexa.  We will continue that with 15 mg p.o. nightly and 5 mg p.o. daily.  She also does have increased anxiety and has been given propranolol for that in the past.  We will continue that.  Unfortunately no laboratories have been collected on her yet.  I have moved her labs to be done tonight including her urine pregnancy test as well as her drug screen to have a better idea of what is in her system.  Unfortunately she has been to the system multiple times, and placement will be very difficult.  Attempts to have her [facility] the state [facility] also failed.  We will attempt to find her some coordinated living circumstances.  I will go on and give her Haldol 10 mg/Ativan 2 mg and Benadryl 25 mg IM x1 to calm her down since she is arrived on the unit agitated.  Her vital signs are stable and she is afebrile.  I will also put lorazepam 1 mg p.o. every 6 hours as needed a CIWA greater than 10 in case she has been abusing benzodiazepines off the street.  I certify that inpatient services furnished can reasonably  be expected to improve the patient's condition.   Sharma Covert, MD 05/08/2020, 3:20 PM

## 2020-05-08 NOTE — Progress Notes (Signed)
Psychoeducational Group Note  Date:  05/08/2020 Time 1807  Group Topic/Focus:  Developing a Wellness Toolbox:   The focus of this group is to help patients develop a "wellness toolbox" with skills and strategies to promote recovery upon discharge.  Participation Level: Did Not Attend  Participation Quality:  Not Applicable  Affect:  Not Applicable  Cognitive:  Not Applicable  Insight:  Not Applicable  Engagement in Group: Not Applicable  Additional Comments:  Pt was medicated and asleep during the group session.  Xenia Nile E 05/08/2020, 6:08 PM

## 2020-05-08 NOTE — Progress Notes (Signed)
Adult Psychoeducational Group Note  Date:  05/08/2020 Time:  10:52 PM  Group Topic/Focus:  Wrap-Up Group:   The focus of this group is to help patients review their daily goal of treatment and discuss progress on daily workbooks.  Participation Level:  Did Not Attend  Participation Quality:  Did Not Attend  Affect:  Did Not Attend  Cognitive:  Did Not Attend  Insight: None  Engagement in Group:  Did Not Attend  Modes of Intervention:  Did Not Attend  Additional Comments:  Pt did not attend evening wrap up group tonight.  Candy Sledge 05/08/2020, 10:52 PM

## 2020-05-08 NOTE — Tx Team (Signed)
Initial Treatment Plan 05/08/2020 3:43 PM [name] [id number]    PATIENT STRESSORS: Financial difficulties Health problems Legal issue Medication change or noncompliance Substance abuse   PATIENT STRENGTHS: Ability for insight Communication skills   PATIENT IDENTIFIED PROBLEMS: "To find a place to stay"  " To work out my stress"  Substance Abuse  Anxiety  Auditory Hallucination             DISCHARGE CRITERIA:  Ability to meet basic life and health needs Medical problems require only outpatient monitoring  PRELIMINARY DISCHARGE PLAN: Attend aftercare/continuing care group Outpatient therapy Placement in alternative living arrangements  PATIENT/FAMILY INVOLVEMENT: This treatment plan has been presented to and reviewed with the patient, [employer] [employer].  The patient and family have been given the opportunity to ask questions and make suggestions.  Vela Prose, RN 05/08/2020, 3:43 PM

## 2020-05-08 NOTE — H&P (Signed)
Psychiatric Admission Assessment Adult  Patient Identification: ZYANNA LEISINGER MRN:  [id number] Date of Evaluation:  05/08/2020 Chief Complaint:  Schizoaffective disorder ([place]) [F25.9] Principal Diagnosis: <principal problem not specified> Diagnosis:  Active Problems:   Schizoaffective disorder ([place])  History of Present Illness: Patient is seen and examined.  [name] familiar to me from multiple hospitalizations at the [facility] behavioral health system both at the behavioral health hospital as well as [facility] who presented to the behavioral health hospital as a walk-in patient.  She has a past psychiatric history significant for schizophrenia/schizoaffective disorder as well as polysubstance use disorders.  Her primary substances of choice include methamphetamines as well as cocaine.  During her previous hospitalization she has been on multiple medications, referred to group homes, referred to shelters, and had ACTT services of which she always seems to be noncompliant with follow-up.  She was seen and was clearly altered.  She was paranoid and agitated.  When she arrived on the unit she began to yell.  This is basically her baseline.  She is very paranoid at times.  She admitted that she had used cocaine as well as some other unspecified substance.  She stated that she remains homeless.  She stated that when she had attempted to go to live with her boyfriend it "did not work out".  She is clearly responding to internal stimuli.  She was admitted to the hospital for evaluation and stabilization.  Associated Signs/Symptoms: Depression Symptoms:  anhedonia, insomnia, feelings of worthlessness/guilt, difficulty concentrating, impaired memory, anxiety, loss of energy/fatigue, disturbed sleep, Duration of Depression Symptoms: No data recorded (Hypo) Manic Symptoms:  Delusions, Distractibility, Impulsivity, Irritable Mood, Labiality of Mood, Anxiety  Symptoms:  Excessive Worry, Psychotic Symptoms:  Delusions, Paranoia, Duration of Psychotic Symptoms: No data recorded PTSD Symptoms: Negative Total Time spent with patient: 45 minutes  Past Psychiatric History: Patient has had multiple psychiatric hospitalizations within our system.  Within the last 12 months she has been admitted 5-6 times previously.  She has been admitted at [facility] as well as the behavioral health hospital.  She has a history of methamphetamine dependence.  She is a diagnosis of schizophrenia with possible onset secondary to her methamphetamine dependence.  She is also been previously diagnosed with schizoaffective disorder.  She has remote history reportedly of attention deficit disorder.  Her substance abuse issues began approximately at age 69 or 17.  Is the patient at risk to self? Yes.    Has the patient been a risk to self in the past 6 months? Yes.    Has the patient been a risk to self within the distant past? Yes.    Is the patient a risk to others? No.  Has the patient been a risk to others in the past 6 months? No.  Has the patient been a risk to others within the distant past? No.   Prior Inpatient Therapy: Prior Inpatient Therapy: Yes Prior Therapy Dates: 2021 Prior Therapy Facilty/Provider(s): [facility] Reason for Treatment: MH issues Prior Outpatient Therapy: Prior Outpatient Therapy: Yes Prior Therapy Dates: Ongoing  Prior Therapy Facilty/Provider(s): [name]  Reason for Treatment: Med mang Does patient have an ACCT team?: No Does patient have Intensive In-House Services?  : No Does patient have [facility] services? : No Does patient have P4CC services?: No  Alcohol Screening:   Substance Abuse History in the last 12 months:  Yes.   Consequences of Substance Abuse: Medical Consequences:  Her continued  drug use with her mental illness just exacerbates things significantly Previous Psychotropic Medications: Yes  Psychological  Evaluations: Yes  Past Medical History:  Past Medical History:  Diagnosis Date  . Burning with urination 05/03/2015  . Contraceptive management 05/03/2015  . Depression   . Dysmenorrhea 12/30/2013  . Heroin addiction ([place])   . Menorrhagia 12/30/2013  . Menstrual extraction 12/30/2013  . Migraines   . Schizophrenia ([place])   . Social anxiety disorder 09/25/2016  . Suicidal ideations   . Vaginal odor 05/03/2015    Past Surgical History:  Procedure Laterality Date  . NO PAST SURGERIES     Family History:  Family History  Problem Relation Age of Onset  . Depression Mother   . Hypertension Father   . Hyperlipidemia Father   . Cancer Paternal Grandmother        breast, uterine  . Cirrhosis Paternal Grandfather        due to alcohol   Family Psychiatric  History: Reportedly depression in several members of her family as well as schizophrenia and a reported aunt. Tobacco Screening:   Social History:  Social History   Substance and Sexual Activity  Alcohol Use Yes     Social History   Substance and Sexual Activity  Drug Use Yes  . Types: Marijuana, Oxycodone, Cocaine    Additional Social History: Marital status: Single    Pain Medications: See MAR Prescriptions: See MAR Over the Counter: See MAR History of alcohol / drug use?: Yes Name of Substance 1: Cannabis 1 - Age of First Use: 14 1 - Amount (size/oz): Varies 1 - Frequency: Varies 1 - Duration: Ongoing 1 - Last Use / Amount: "3-4 days ago"                  Allergies:   Allergies  Allergen Reactions  . Abilify [Aripiprazole]   . Zoloft [Sertraline Hcl]    Lab Results:  Results for orders placed or performed during the hospital encounter of 05/08/20 (from the past 48 hour(s))  Respiratory Panel by RT PCR (Flu A&B, Covid) - Nasopharyngeal Swab     Status: None   Collection Time: 05/08/20  1:12 PM   Specimen: Nasopharyngeal Swab  Result Value Ref Range   SARS Coronavirus 2 by RT PCR NEGATIVE NEGATIVE     Comment: (NOTE) SARS-CoV-2 target nucleic acids are NOT DETECTED.  The SARS-CoV-2 RNA is generally detectable in upper respiratoy specimens during the acute phase of infection. The lowest concentration of SARS-CoV-2 viral copies this assay can detect is 131 copies/mL. A negative result does not preclude SARS-Cov-2 infection and should not be used as the sole basis for treatment or other patient management decisions. A negative result may occur with  improper specimen collection/handling, submission of specimen other than nasopharyngeal swab, presence of viral mutation(s) within the areas targeted by this assay, and inadequate number of viral copies (<131 copies/mL). A negative result must be combined with clinical observations, patient history, and epidemiological information. The expected result is Negative.  Fact Sheet for Patients:  PinkCheek.be  Fact Sheet for Healthcare Providers:  GravelBags.it  This test is no t yet approved or cleared by the [place] FDA and  has been authorized for detection and/or diagnosis of SARS-CoV-2 by FDA under an Emergency Use Authorization (EUA). This EUA will remain  in effect (meaning this test can be used) for the duration of the COVID-19 declaration under Section 564(b)(1) of the Act, 21 U.S.C. section 360bbb-3(b)(1), unless the authorization is  terminated or revoked sooner.     Influenza A by PCR NEGATIVE NEGATIVE   Influenza B by PCR NEGATIVE NEGATIVE    Comment: (NOTE) The Xpert Xpress SARS-CoV-2/FLU/RSV assay is intended as an aid in  the diagnosis of influenza from Nasopharyngeal swab specimens and  should not be used as a sole basis for treatment. Nasal washings and  aspirates are unacceptable for Xpert Xpress SARS-CoV-2/FLU/RSV  testing.  Fact Sheet for Patients: PinkCheek.be  Fact Sheet for Healthcare  Providers: GravelBags.it  This test is not yet approved or cleared by the [place] FDA and  has been authorized for detection and/or diagnosis of SARS-CoV-2 by  FDA under an Emergency Use Authorization (EUA). This EUA will remain  in effect (meaning this test can be used) for the duration of the  Covid-19 declaration under Section 564(b)(1) of the Act, 21  U.S.C. section 360bbb-3(b)(1), unless the authorization is  terminated or revoked. Performed at [facility], [place] [address] [place], [place] [address]     Blood Alcohol level:  Lab Results  Component Value Date   ETH <10 03/29/2020   ETH <10 60/45/4098    Metabolic Disorder Labs:  Lab Results  Component Value Date   HGBA1C 5.5 03/17/2020   MPG 111.15 03/17/2020   MPG 108.28 10/16/2019   Lab Results  Component Value Date   PROLACTIN 113.0 (H) 11/11/2019   PROLACTIN 152.0 (H) 10/16/2019   Lab Results  Component Value Date   CHOL 138 10/16/2019   TRIG 60 10/16/2019   HDL 36 (L) 10/16/2019   CHOLHDL 3.8 10/16/2019   VLDL 12 10/16/2019   LDLCALC 90 10/16/2019   LDLCALC 96 05/17/2019    Current Medications: Current Facility-Administered Medications  Medication Dose Route Frequency Provider Last Rate Last Admin  . haloperidol (HALDOL) tablet 5 mg  5 mg Oral Q6H PRN Sharma Covert, MD       And  . benztropine (COGENTIN) tablet 1 mg  1 mg Oral Q6H PRN Sharma Covert, MD      . benztropine (COGENTIN) tablet 2 mg  2 mg Oral BID PRN Mordecai Maes, NP      . diphenhydrAMINE (BENADRYL) injection 25 mg  25 mg Intramuscular Once Sharma Covert, MD      . divalproex (DEPAKOTE) DR tablet 1,000 mg  1,000 mg Oral Q12H Mordecai Maes, NP   1,000 mg at 05/08/20 1513  . FLUoxetine (PROZAC) capsule 40 mg  40 mg Oral Daily Mordecai Maes, NP   40 mg at 05/08/20 1513  . haloperidol (HALDOL) tablet 15 mg  15 mg Oral BID Mordecai Maes, NP   15 mg at 05/08/20 1514   . haloperidol lactate (HALDOL) injection 10 mg  10 mg Intramuscular Once Sharma Covert, MD      . hydrOXYzine (ATARAX/VISTARIL) tablet 50 mg  50 mg Oral TID PRN Mordecai Maes, NP      . LORazepam (ATIVAN) injection 2 mg  2 mg Intramuscular Once Sharma Covert, MD      . LORazepam (ATIVAN) tablet 1 mg  1 mg Oral Q6H PRN Sharma Covert, MD      . OLANZapine zydis (ZYPREXA) disintegrating tablet 10 mg  10 mg Oral Q8H PRN Sharma Covert, MD       And  . LORazepam (ATIVAN) tablet 1 mg  1 mg Oral Q6H PRN Sharma Covert, MD       And  . ziprasidone (GEODON) injection 20 mg  20 mg Intramuscular Q6H PRN Sharma Covert, MD      . OLANZapine [facility]) tablet 5 mg  5 mg Oral Daily Mordecai Maes, NP   5 mg at 05/08/20 1516  . OLANZapine zydis (ZYPREXA) disintegrating tablet 15 mg  15 mg Oral QHS Mordecai Maes, NP      . pantoprazole (PROTONIX) EC tablet 40 mg  40 mg Oral QHS Mordecai Maes, NP      . propranolol (INDERAL) tablet 10 mg  10 mg Oral TID Mordecai Maes, NP   10 mg at 05/08/20 1513  . traZODone (DESYREL) tablet 100 mg  100 mg Oral QHS PRN Mordecai Maes, NP       PTA Medications: Medications Prior to Admission  Medication Sig Dispense Refill Last Dose  . benztropine (COGENTIN) 2 MG tablet Take 1 tablet (2 mg total) by mouth 2 (two) times daily as needed for tremors (EPS). 15 tablet 0   . busPIRone (BUSPAR) 5 MG tablet Take 5 mg by mouth 2 (two) times daily.     . divalproex (DEPAKOTE) 500 MG DR tablet Take 2 tablets (1,000 mg total) by mouth every 12 (twelve) hours. 60 tablet 0   . FLUoxetine (PROZAC) 20 MG capsule Take 60 mg by mouth every morning.     [name] FLUoxetine (PROZAC) 40 MG capsule Take 1 capsule (40 mg total) by mouth daily. (Patient not taking: Reported on 03/29/2020) 30 capsule 0   . haloperidol (HALDOL) 5 MG tablet Take 3 tablets (15 mg total) by mouth 2 (two) times daily. 90 tablet 0   . hydrOXYzine (ATARAX/VISTARIL) 50 MG tablet Take 1  tablet (50 mg total) by mouth 3 (three) times daily as needed for anxiety. 90 tablet 0   . nicotine polacrilex (NICORETTE) 2 MG gum Take 1 each (2 mg total) by mouth as needed. (May buy from over the counter): For smoking cessation 100 tablet 0   . OLANZapine (ZYPREXA) 5 MG tablet Take 1 tablet (5 mg total) by mouth daily. For mood control 30 tablet 0   . olanzapine zydis (ZYPREXA) 15 MG disintegrating tablet Take 1 tablet (15 mg total) by mouth at bedtime. For mood control 30 tablet 0   . paliperidone (INVEGA) 3 MG 24 hr tablet Take 3 mg by mouth at bedtime.     . pantoprazole (PROTONIX) 40 MG tablet Take 1 tablet (40 mg total) by mouth at bedtime. For acid rflux 30 tablet 0   . propranolol (INDERAL) 10 MG tablet Take 1 tablet (10 mg total) by mouth 3 (three) times daily. For anxiety 45 tablet 0   . traZODone (DESYREL) 100 MG tablet Take 1 tablet (100 mg total) by mouth at bedtime as needed for sleep. 15 tablet 0     Musculoskeletal: Strength & Muscle Tone: within normal limits Gait & Station: normal Patient leans: N/A  Psychiatric Specialty Exam: Physical Exam Vitals and nursing note reviewed.  HENT:     Head: Normocephalic and atraumatic.  Pulmonary:     Effort: Pulmonary effort is normal.  Neurological:     General: No focal deficit present.     Mental Status: She is alert.     Review of Systems  Blood pressure 109/69, pulse (!) 101, temperature (!) 97.4 F (36.3 C), temperature source Oral, resp. rate 18, height 5\' 3"  (1.6 m), weight 82.1 kg, SpO2 100 %.Body mass index is 32.06 kg/m.  General Appearance: Disheveled  Eye Contact:  Minimal  Speech:  Pressured  Volume:  Increased  Mood:  Anxious, Dysphoric and Irritable  Affect:  Labile  Thought Process:  Disorganized and Descriptions of Associations: Loose  Orientation:  Negative  Thought Content:  Delusions and Paranoid Ideation  Suicidal Thoughts:  No  Homicidal Thoughts:  No  Memory:  Immediate;   Poor Recent;    Poor Remote;   Poor  Judgement:  Impaired  Insight:  Lacking  Psychomotor Activity:  Increased  Concentration:  Concentration: Poor  Recall:  Poor  Fund of Knowledge:  Poor  Language:  Fair  Akathisia:  Negative  Handed:  Right  AIMS (if indicated):     Assets:  Desire for Improvement Resilience  ADL's:  Intact  Cognition:  WNL  Sleep:       Treatment Plan Summary: Daily contact with patient to assess and evaluate symptoms and progress in treatment, Medication management and Plan : Patient is seen and examined.  [name] familiar to me from multiple psychiatric hospitalizations before.  She will be admitted to the hospital.  She will be integrated in the milieu.  She will be encouraged to attend groups.  Given her history of noncompliance we are basically going to restart her medications that she had on her previous admission.  With regard to her Prozac we will decrease that to 20 mg a day just in case she had been noncompliant with it, and will increase it during the course of the hospitalization if necessary.  We will continue her Haldol at 15 mg p.o. twice daily, her Depakote DR to 1000 mg p.o. every 12 hours.  She has required multiple antipsychotics to control her agitation over time.  She has been on the paliperidone long-acting injectable medication as well.  Most recently she was on the Zyprexa.  We will continue that with 15 mg p.o. nightly and 5 mg p.o. daily.  She also does have increased anxiety and has been given propranolol for that in the past.  We will continue that.  Unfortunately no laboratories have been collected on her yet.  I have moved her labs to be done tonight including her urine pregnancy test as well as her drug screen to have a better idea of what is in her system.  Unfortunately she has been to the system multiple times, and placement will be very difficult.  Attempts to have her sent to the state hospital of also failed.  We will attempt to  find her some coordinated living circumstances.  I will go on and give her Haldol 10 mg/Ativan 2 mg and Benadryl 25 mg IM x1 to calm her down since she is arrived on the unit agitated.  Her vital signs are stable and she is afebrile.  I will also put lorazepam 1 mg p.o. every 6 hours as needed a CIWA greater than 10 in case she has been abusing benzodiazepines off the street.  Observation Level/Precautions:  15 minute checks  Laboratory:  CBC Chemistry Profile HbAIC HCG UDS UA  Psychotherapy:    Medications:    Consultations:    Discharge Concerns:    Estimated LOS:  Other:     Physician Treatment Plan for Primary Diagnosis: <principal problem not specified> Long Term Goal(s): Improvement in symptoms so as ready for discharge  Short Term Goals: Ability to identify changes in lifestyle to reduce recurrence of condition will improve, Ability to verbalize feelings will improve, Ability to demonstrate self-control will improve, Ability to identify and develop effective coping behaviors will improve, Ability to maintain clinical  measurements within normal limits will improve, Compliance with prescribed medications will improve and Ability to identify triggers associated with substance abuse/mental health issues will improve  Physician Treatment Plan for Secondary Diagnosis: Active Problems:   Schizoaffective disorder ([place])  Long Term Goal(s): Improvement in symptoms so as ready for discharge  Short Term Goals: Ability to identify changes in lifestyle to reduce recurrence of condition will improve, Ability to verbalize feelings will improve, Ability to demonstrate self-control will improve, Ability to identify and develop effective coping behaviors will improve, Ability to maintain clinical measurements within normal limits will improve, Compliance with prescribed medications will improve and Ability to identify triggers associated with substance abuse/mental health issues will improve  I  certify that inpatient services furnished can reasonably be expected to improve the patient's condition.    Sharma Covert, MD 10/25/20213:29 PM

## 2020-05-09 DIAGNOSIS — F4010 Social phobia, unspecified: Secondary | ICD-10-CM | POA: Diagnosis not present

## 2020-05-09 DIAGNOSIS — F1910 Other psychoactive substance abuse, uncomplicated: Secondary | ICD-10-CM

## 2020-05-09 DIAGNOSIS — F250 Schizoaffective disorder, bipolar type: Secondary | ICD-10-CM | POA: Diagnosis not present

## 2020-05-09 LAB — CBC
HCT: 37.9 % (ref 36.0–46.0)
Hemoglobin: 12.1 g/dL (ref 12.0–15.0)
MCH: 28.9 pg (ref 26.0–34.0)
MCHC: 31.9 g/dL (ref 30.0–36.0)
MCV: 90.5 fL (ref 80.0–100.0)
Platelets: 291 10*3/uL (ref 150–400)
RBC: 4.19 MIL/uL (ref 3.87–5.11)
RDW: 14 % (ref 11.5–15.5)
WBC: 7.8 10*3/uL (ref 4.0–10.5)
nRBC: 0 % (ref 0.0–0.2)

## 2020-05-09 LAB — RAPID URINE DRUG SCREEN, HOSP PERFORMED
Amphetamines: NOT DETECTED
Barbiturates: NOT DETECTED
Benzodiazepines: POSITIVE — AB
Cocaine: POSITIVE — AB
Opiates: NOT DETECTED
Tetrahydrocannabinol: POSITIVE — AB

## 2020-05-09 LAB — COMPREHENSIVE METABOLIC PANEL
ALT: 20 U/L (ref 0–44)
AST: 14 U/L — ABNORMAL LOW (ref 15–41)
Albumin: 3.6 g/dL (ref 3.5–5.0)
Alkaline Phosphatase: 78 U/L (ref 38–126)
Anion gap: 9 (ref 5–15)
BUN: 16 mg/dL (ref 6–20)
CO2: 26 mmol/L (ref 22–32)
Calcium: 8.9 mg/dL (ref 8.9–10.3)
Chloride: 105 mmol/L (ref 98–111)
Creatinine, Ser: 0.87 mg/dL (ref 0.44–1.00)
GFR, Estimated: >60 mL/min (ref >60)
Glucose, Bld: 101 mg/dL — ABNORMAL HIGH (ref 70–99)
Potassium: 4.3 mmol/L (ref 3.5–5.1)
Sodium: 140 mmol/L (ref 135–145)
Total Bilirubin: 0.4 mg/dL (ref 0.3–1.2)
Total Protein: 6.9 g/dL (ref 6.5–8.1)

## 2020-05-09 LAB — VALPROIC ACID LEVEL: Valproic Acid Lvl: 84 ug/mL (ref 50.0–100.0)

## 2020-05-09 LAB — PREGNANCY, URINE: Preg Test, Ur: NEGATIVE

## 2020-05-09 MED ORDER — NICOTINE POLACRILEX 2 MG MT GUM
2.0000 mg | CHEWING_GUM | OROMUCOSAL | Status: DC | PRN
  Administered 2020-05-12 – 2020-05-17 (×7): 2 mg via ORAL
  Filled 2020-05-09 (×8): qty 1

## 2020-05-09 MED ORDER — NICOTINE 21 MG/24HR TD PT24
21.0000 mg | MEDICATED_PATCH | Freq: Every day | TRANSDERMAL | Status: DC
  Filled 2020-05-09 (×3): qty 1

## 2020-05-09 NOTE — [facility] Suicide Risk Assessment (Signed)
[place] INPATIENT:  Family/Significant Other Suicide Prevention Education  Suicide Prevention Education:  Education Completed; Gabryelle Whitmoyer [phone]), father, has been identified by the patient as the family member/significant other with whom the patient will be residing, and identified as the person(s) who will aid the patient in the event of a mental health crisis (suicidal ideations/suicide attempt).  With written consent from the patient, the family member/significant other has been provided the following suicide prevention education, prior to the and/or following the discharge of the patient.  The suicide prevention education provided includes the following:  Suicide risk factors  Suicide prevention and interventions  National Suicide Hotline telephone number  [facility] assessment telephone number  [facility] Emergency Assistance [place] and/or Residential Mobile Crisis Unit telephone number  Request made of family/significant other to:  Remove weapons (e.g., guns, rifles, knives), all items previously/currently identified as safety concern.    Remove drugs/medications (over-the-counter, prescriptions, illicit drugs), all items previously/currently identified as a safety concern.  CSW completed SPE with Pt's father, Vinia Jemmott. He is aware of Pt's admission and privy to her on-going mental health issues. He stated that she continues to have difficulty in the community, specifically non-compliance with medications and substance use issues. He stated that she does not reside with him at this time, however she does call him from time to time. Per father, Pt is often verbally aggressive. Pt has no other concerns. He does feel maintaining services with the ACTT is important as well.   The family member/significant other verbalizes understanding of the suicide prevention education information provided. The family member/significant other agrees to remove the  items of safety concern listed above.  [place], LCSW 05/09/2020, 1:53 PM

## 2020-05-09 NOTE — Progress Notes (Signed)
   05/09/20 2049  Psych Admission Type (Psych Patients Only)  Admission Status Voluntary  Psychosocial Assessment  Patient Complaints Substance abuse  Eye Contact Brief  Facial Expression Sad  Affect Sad;Blunted  Speech Slow;Soft  Interaction Minimal  Motor Activity Slow  Appearance/Hygiene Unremarkable  Behavior Characteristics Cooperative  Mood Depressed  Thought Process  Coherency Blocking  Content WDL  Delusions None reported or observed  Perception WDL  Hallucination None reported or observed  Judgment Impaired  Confusion None  Danger to Self  Current suicidal ideation? Denies  Danger to Others  Danger to Others None reported or observed   Pt seen at med window. Pt a little groggy but cooperative. Denies SI, HI, AVH and pain. Pt very subdued.

## 2020-05-09 NOTE — Progress Notes (Signed)
[facility] MD Progress Note  05/09/2020 8:21 AM [name]  MRN:  [id number] Subjective:  "One thing leads to another thing and then another thing". Pt is seen and examined today. Pt states her mood is little better. She rates her mood at 8/10 (10 is the best mood). Pt slept well last night. Pt states her appetite is fair states she has not been eating all meals but eating fair. Pt rates her anxiety at 7/10 (10 is the worst anxiety). Currently, Pt denies any suicidal ideation, homicidal ideation. She endorses auditory hallucinations states she hears demon and god talking to each other and to her. When asked about command hallucinations, she states "Demon tell to me harm myself". She endorses visual hallucinations states she sees black floating shadows and orbs. She reports she had been using Crack Cocaine, Ecstasy and  Marijuana for last few weeks. We talked about the importance of medication compliance. We also talked about the negative effects of drugs. She got irritable and started shouting "OKAY, one thing leads to another thing and then another thing". She states she is homeless now and wants the CSW to help her with housing. Pt denies any headache, nausea, vomiting, dizziness, chest pain, SOB, abdominal pain, diarrhea, and constipation. Pt denies any medication side effects and has been tolerating it well. Pt denies any concerns.  Objective : Julia [name] is a 19 year old female with past psychiatric history significant for schizophrenia/schizoaffective disorder as well as polysubstance use disorders who presented to the behavioral health hospital as a walk-in patient with c/o auditory hallucinations. On examination, Pt is found sitting on her bed. She is anxious, dysphoric, and irritable. Her speech is blocked, she is slow in responding questions and stares at the provider. She is clearly responding to internal stimuli. She got irritable and started shouting when this provider talked about the negative  effects of drug use.  Her mood is irritable, anxious and dysphoric with labile affect. Her eye contact is minimal. She endorces AVH. Denies SI and HI. Blood pressure 114/85, pulse (!) 136, temperature (!) 97.4 F (36.3 C), temperature source Oral, resp. rate 18, height 5\' 3"  (1.6 m), weight 82.1 kg, SpO2 100 %. Nursing notes indicate that Pt slept for 10.25 hours. Pt didn't attend any groups yesterday evening. Pt slept majority of the evening yesterday.  Principal Problem: Schizoaffective disorder ([place]) Diagnosis: Principal Problem:   Schizoaffective disorder ([place]) Active Problems:   Polysubstance abuse (HCC)   Social anxiety disorder  Total Time spent with patient: 20 minutes  Past Psychiatric History: see H&P  Past Medical History:  Past Medical History:  Diagnosis Date  . Burning with urination 05/03/2015  . Contraceptive management 05/03/2015  . Depression   . Dysmenorrhea 12/30/2013  . Heroin addiction ([place])   . Menorrhagia 12/30/2013  . Menstrual extraction 12/30/2013  . Migraines   . Schizophrenia ([place])   . Social anxiety disorder 09/25/2016  . Suicidal ideations   . Vaginal odor 05/03/2015    Past Surgical History:  Procedure Laterality Date  . NO PAST SURGERIES     Family History:  Family History  Problem Relation Age of Onset  . Depression Mother   . Hypertension Father   . Hyperlipidemia Father   . Cancer Paternal Grandmother        breast, uterine  . Cirrhosis Paternal Grandfather        due to alcohol   Family Psychiatric  History: see H&P Social History:  Social History   Substance  and Sexual Activity  Alcohol Use Yes     Social History   Substance and Sexual Activity  Drug Use Yes  . Types: Marijuana, Oxycodone, Cocaine    Social History   Socioeconomic History  . Marital status: Single    Spouse name: Not on file  . Number of children: Not on file  . Years of education: Not on file  . Highest education level: Not on file  Occupational  History  . Occupation: Unemployed  Tobacco Use  . Smoking status: Current Every Day Smoker    Packs/day: 1.00    Types: Cigarettes  . Smokeless tobacco: Never Used  Vaping Use  . Vaping Use: Never used  Substance and Sexual Activity  . Alcohol use: Yes  . Drug use: Yes    Types: Marijuana, Oxycodone, Cocaine  . Sexual activity: Not on file  Other Topics Concern  . Not on file  Social History Narrative   06/23/2019:  Pt stated that she is homeless, that she is a high school graduate, and that she is unemployed and not followed by any outpatient provider.      Lives with Dad. Mom has LGD, lives in [place]. 11th grader. Dog.   Social Determinants of Health   Financial Resource Strain:   . Difficulty of Paying Living Expenses: Not on file  Food Insecurity:   . Worried About Charity fundraiser in the Last Year: Not on file  . Ran Out of Food in the Last Year: Not on file  Transportation Needs:   . Lack of Transportation (Medical): Not on file  . Lack of Transportation (Non-Medical): Not on file  Physical Activity:   . Days of Exercise per Week: Not on file  . Minutes of Exercise per Session: Not on file  Stress:   . Feeling of Stress : Not on file  Social Connections:   . Frequency of Communication with Friends and Family: Not on file  . Frequency of Social Gatherings with Friends and Family: Not on file  . Attends Religious Services: Not on file  . Active Member of Clubs or Organizations: Not on file  . Attends Archivist Meetings: Not on file  . Marital Status: Not on file   Additional Social History:    Pain Medications: See MAR Prescriptions: See MAR Over the Counter: See MAR History of alcohol / drug use?: Yes Name of Substance 1: Cannabis 1 - Age of First Use: 14 1 - Amount (size/oz): Varies 1 - Frequency: Varies 1 - Duration: Ongoing 1 - Last Use / Amount: "3-4 days ago"                  Sleep: Good  Appetite:  Fair  Current  Medications: Current Facility-Administered Medications  Medication Dose Route Frequency Provider Last Rate Last Admin  . acetaminophen (TYLENOL) tablet 650 mg  650 mg Oral Q6H PRN Sharma Covert, MD      . haloperidol (HALDOL) tablet 5 mg  5 mg Oral Q6H PRN Sharma Covert, MD       And  . benztropine (COGENTIN) tablet 1 mg  1 mg Oral Q6H PRN Sharma Covert, MD   1 mg at 05/09/20 0801  . benztropine (COGENTIN) tablet 2 mg  2 mg Oral BID PRN Mordecai Maes, NP      . diphenhydrAMINE (BENADRYL) injection 25 mg  25 mg Intramuscular Once Sharma Covert, MD      . divalproex (DEPAKOTE) DR tablet  1,000 mg  1,000 mg Oral Q12H Mordecai Maes, NP   1,000 mg at 05/09/20 0800  . FLUoxetine (PROZAC) capsule 40 mg  40 mg Oral Daily Mordecai Maes, NP   40 mg at 05/09/20 0800  . haloperidol (HALDOL) tablet 15 mg  15 mg Oral BID Mordecai Maes, NP   15 mg at 05/09/20 0800  . haloperidol lactate (HALDOL) injection 10 mg  10 mg Intramuscular Once Sharma Covert, MD      . hydrOXYzine (ATARAX/VISTARIL) tablet 50 mg  50 mg Oral TID PRN Mordecai Maes, NP      . LORazepam (ATIVAN) injection 2 mg  2 mg Intramuscular Once Sharma Covert, MD      . LORazepam (ATIVAN) tablet 1 mg  1 mg Oral Q6H PRN Sharma Covert, MD      . OLANZapine zydis (ZYPREXA) disintegrating tablet 10 mg  10 mg Oral Q8H PRN Sharma Covert, MD       And  . LORazepam (ATIVAN) tablet 1 mg  1 mg Oral Q6H PRN Sharma Covert, MD       And  . ziprasidone (GEODON) injection 20 mg  20 mg Intramuscular Q6H PRN Sharma Covert, MD      . OLANZapine [facility]) tablet 5 mg  5 mg Oral Daily Mordecai Maes, NP   5 mg at 05/09/20 0803  . OLANZapine zydis (ZYPREXA) disintegrating tablet 15 mg  15 mg Oral QHS Mordecai Maes, NP      . pantoprazole (PROTONIX) EC tablet 40 mg  40 mg Oral QHS Mordecai Maes, NP      . propranolol (INDERAL) tablet 10 mg  10 mg Oral TID Mordecai Maes, NP   10 mg at 05/09/20  0800  . traZODone (DESYREL) tablet 100 mg  100 mg Oral QHS PRN Mordecai Maes, NP        Lab Results:  Results for orders placed or performed during the hospital encounter of 05/08/20 (from the past 48 hour(s))  Respiratory Panel by RT PCR (Flu A&B, Covid) - Nasopharyngeal Swab     Status: None   Collection Time: 05/08/20  1:12 PM   Specimen: Nasopharyngeal Swab  Result Value Ref Range   SARS Coronavirus 2 by RT PCR NEGATIVE NEGATIVE    Comment: (NOTE) SARS-CoV-2 target nucleic acids are NOT DETECTED.  The SARS-CoV-2 RNA is generally detectable in upper respiratoy specimens during the acute phase of infection. The lowest concentration of SARS-CoV-2 viral copies this assay can detect is 131 copies/mL. A negative result does not preclude SARS-Cov-2 infection and should not be used as the sole basis for treatment or other patient management decisions. A negative result may occur with  improper specimen collection/handling, submission of specimen other than nasopharyngeal swab, presence of viral mutation(s) within the areas targeted by this assay, and inadequate number of viral copies (<131 copies/mL). A negative result must be combined with clinical observations, patient history, and epidemiological information. The expected result is Negative.  Fact Sheet for Patients:  PinkCheek.be  Fact Sheet for Healthcare Providers:  GravelBags.it  This test is no t yet approved or cleared by the [place] FDA and  has been authorized for detection and/or diagnosis of SARS-CoV-2 by FDA under an Emergency Use Authorization (EUA). This EUA will remain  in effect (meaning this test can be used) for the duration of the COVID-19 declaration under Section 564(b)(1) of the Act, 21 U.S.C. section 360bbb-3(b)(1), unless the authorization is terminated or revoked sooner.  Influenza A by PCR NEGATIVE NEGATIVE   Influenza B by PCR  NEGATIVE NEGATIVE    Comment: (NOTE) The Xpert Xpress SARS-CoV-2/FLU/RSV assay is intended as an aid in  the diagnosis of influenza from Nasopharyngeal swab specimens and  should not be used as a sole basis for treatment. Nasal washings and  aspirates are unacceptable for Xpert Xpress SARS-CoV-2/FLU/RSV  testing.  Fact Sheet for Patients: PinkCheek.be  Fact Sheet for Healthcare Providers: GravelBags.it  This test is not yet approved or cleared by the [place] FDA and  has been authorized for detection and/or diagnosis of SARS-CoV-2 by  FDA under an Emergency Use Authorization (EUA). This EUA will remain  in effect (meaning this test can be used) for the duration of the  Covid-19 declaration under Section 564(b)(1) of the Act, 21  U.S.C. section 360bbb-3(b)(1), unless the authorization is  terminated or revoked. Performed at [facility] [place] [address], [place], [place] [address]     Blood Alcohol level:  Lab Results  Component Value Date   ETH <10 03/29/2020   ETH <10 31/51/7616    Metabolic Disorder Labs: Lab Results  Component Value Date   HGBA1C 5.5 03/17/2020   MPG 111.15 03/17/2020   MPG 108.28 10/16/2019   Lab Results  Component Value Date   PROLACTIN 113.0 (H) 11/11/2019   PROLACTIN 152.0 (H) 10/16/2019   Lab Results  Component Value Date   CHOL 138 10/16/2019   TRIG 60 10/16/2019   HDL 36 (L) 10/16/2019   CHOLHDL 3.8 10/16/2019   VLDL 12 10/16/2019   LDLCALC 90 10/16/2019   LDLCALC 96 05/17/2019    Physical Findings: AIMS: Facial and Oral Movements Muscles of Facial Expression: None, normal Lips and Perioral Area: None, normal Jaw: None, normal Tongue: None, normal,Extremity Movements Upper (arms, wrists, hands, fingers): None, normal Lower (legs, knees, ankles, toes): None, normal, Trunk Movements Neck, shoulders, hips: None, normal, Overall Severity Severity of  abnormal movements (highest score from questions above): None, normal Incapacitation due to abnormal movements: None, normal Patient's awareness of abnormal movements (rate only patient's report): No Awareness, Dental Status Current problems with teeth and/or dentures?: No Does patient usually wear dentures?: No  CIWA:  CIWA-Ar Total: 4 COWS:  COWS Total Score: 5  Musculoskeletal: Strength & Muscle Tone: within normal limits Gait & Station: normal Patient leans: N/A  Psychiatric Specialty Exam: Physical Exam Vitals and nursing note reviewed.  Constitutional:      General: She is in acute distress.     Appearance: Normal appearance. She is obese. She is not ill-appearing, toxic-appearing or diaphoretic.  HENT:     Head: Normocephalic and atraumatic.  Pulmonary:     Effort: Pulmonary effort is normal.  Neurological:     General: No focal deficit present.     Mental Status: She is alert.     Review of Systems  Constitutional: Positive for appetite change. Negative for activity change, diaphoresis, fatigue and fever.  Eyes: Negative.   Respiratory: Negative.   Cardiovascular: Negative.   Gastrointestinal: Negative.   Genitourinary: Negative.   Musculoskeletal: Negative.   Neurological: Negative.   Psychiatric/Behavioral: Positive for dysphoric mood and hallucinations. Negative for suicidal ideas. The patient is nervous/anxious.     Blood pressure 114/85, pulse (!) 136, temperature (!) 97.4 F (36.3 C), temperature source Oral, resp. rate 18, height 5\' 3"  (1.6 m), weight 82.1 kg, SpO2 100 %.Body mass index is 32.06 kg/m.  General Appearance: Disheveled  Eye Contact:  Minimal  Speech:  Blocked   Volume:  Increased  Mood:  Anxious, Dysphoric and Irritable  Affect:  Labile  Thought Process:  Disorganized and Descriptions of Associations: Loose  Orientation:  Negative  Thought Content:  Delusions, Hallucinations: Auditory Visual and Paranoid Ideation  Suicidal Thoughts:  No   Homicidal Thoughts:  No  Memory:  Immediate;   Poor Recent;   Poor  Judgement:  Impaired  Insight:  Lacking  Psychomotor Activity:  Increased  Concentration:  Concentration: Poor and Attention Span: Poor  Recall:  Poor  Fund of Knowledge:  Poor  Language:  Fair  Akathisia:  Negative  Handed:  Right  AIMS (if indicated):     Assets:  Desire for Improvement Resilience  ADL's:  Intact  Cognition:  WNL  Sleep:  Number of Hours: 10.25     Treatment Plan Summary:Julia [name] is a 19 year old female with past psychiatric history significant for schizophrenia/schizoaffective disorder as well as polysubstance use disorders who presented to the behavioral health hospital as a walk-in patient with c/o auditory hallucinations. On examination, Pt is found sitting on her bed. She is anxious, dysphoric, and irritable. Her speech is blocked, she is slow in responding questions and stares at the provider. She is clearly responding to internal stimuli. She got irritable and started shouting when this provider talked about the negative effects of drug use.  Her mood is irritable, anxious and dysphoric with labile affect. Her eye contact is minimal. She endorces AVH. Denies SI and HI. Blood pressure 114/85, pulse (!) 136, temperature (!) 97.4 F (36.3 C), temperature source Oral, resp. rate 18, height 5\' 3"  (1.6 m), weight 82.1 kg, SpO2 100 %. Nursing notes indicate that Pt slept for 10.25 hours. Pt didn't attend any groups yesterday evening. Pt slept majority of the evening yesterday.  Labs Ordered yesterday. Still waiting for results. Plan- Daily contact with patient to assess and evaluate symptoms and progress in treatment -Monitor Vitals. -Monitor for withdrawal symptoms. -Monitor for medication side effects.  Scheduled medications  -Continue Depakote 100 mg BID for Mood stabilization. -Continue Haldol 15 mg BID for Psychosis. -Continue Prozac 40 mg Daily. -Continue Zyprexa 5 mg Daily and 15  mg Bed time. -Continue Protonix EC 40 mg at bed time. -Continue Propanolol 10 mg TID.  PRN Medications  -Continue Cogentin 2 mg BID Prn for EPS /Tremors. -Continue Haldol 5 mg and Cogentin PRN Q6H for Agitation. -Continue Ativan 1 mg 6 hourly PRN if CIWA >10 for withdrawal effects. -Continue Agitation protocol as needed with Zyprexa/Ativan/Geodone.  -Continue Hydroxyzine 50 mg TID PRN for Anxiety. -Continue Trazodone 100 mg QHS PRN for sleep.  Psychosocial   -Patient will participate in the therapeutic group milieu. -Encourage medication Compliance. -CSW to help with placement.   Armando Reichert, MD 05/09/2020, 8:21 AM

## 2020-05-09 NOTE — BHH Counselor (Signed)
Adult Comprehensive Assessment  Patient Julia [name],femaleDOB:[birth year],19 y.o.GQQ:[id number]  Information Source: Information source: Patient  Current Stressors: Patient states their primary concerns and needs for treatment are:: "Didn't have anywhere to go" Patient states their goals for this hospitilization and ongoing recovery are:: "To find somewhere to live" Educational / Learning stressors: Denies stressors Employment / Job issues: Currently unemployed. Family Relationships: Denies stressors.  Financial / Lack of resources (include bankruptcy): Lack of income Housing / Lack of housing: Currently has no where to live Physical health (include injuries & life threatening diseases): Denies stressors Social relationships: Denies stressors Substance abuse: Denies stressors Bereavement / Loss: Mother died in Oct 08, 2017.  Living/Environment/Situation: Living Arrangements: Alone Living conditions (as described by patient or guardian): Currently homeless Who else lives in the home?: N/a How long has patient lived in current situation?: 1-1/2 months What is atmosphere in current home: Dangerous, Temporary  Family History: Marital status: Single  Are you sexually active?: No What is your sexual orientation?: heterosexual Has your sexual activity been affected by drugs, alcohol, medication, or emotional stress?: Medication Does patient have children?: No  Childhood History: By whom was/is the patient raised?: Both parents Additional childhood history information: both parents raised her; divorced when she was 19yo Description of patient's relationship with caregiver when they were a child: Close to both growing up. However, mother left when patient was 14yo to live in [place]. Patient's description of current relationship with people who raised him/her: Father - relationship is better now than previously; Mother - died in 2017-10-08 How were you disciplined when you got  in trouble as a child/adolescent?: Items taken away Does patient have siblings?: Yes Number of Siblings: 1 Description of patient's current relationship with siblings: Brother - "okay" relationship Did patient suffer any verbal/emotional/physical/sexual abuse as a child?: No Did patient suffer from severe childhood neglect?: No Has patient ever been sexually abused/assaulted/raped as an adolescent or adult?: No Was the patient ever a victim of a crime or a disaster?: No Witnessed domestic violence?: No Has patient been effected by domestic violence as an adult?: No  Education: Highest grade of school patient has completed: High school graduate Currently a student?: No Learning disability?: No  Employment/Work Situation: Employment situation: Unemployed What is the longest time patient has a held a job?: A few days Where was the patient employed at that time?: Restaurant Did You Receive Any Psychiatric Treatment/Services While in the [employer]?: (No [employer]) Are There Guns or Other Weapons in [place]?: No  Financial Resources: Museum/gallery curator resources: Medicaid, No income Does patient have a Programmer, applications or guardian?: No  Alcohol/Substance Abuse: What has been your use of drugs/alcohol within the last 12 months?: States she will drink alcohol occasionally, however no longer uses THC and declined discussing other use Alcohol/Substance Abuse Treatment Hx: Past Tx, Outpatient, Past Tx, Inpatient If yes, describe treatment: [facility] in 10/08/2016, 2017-10-08, 02/2019, and 05/2019. [facility] for outpatient Has alcohol/substance abuse ever caused legal problems?: Yes  Social Support System: Patient's Community Support System: Fine Describe Community Support System: Dad Type of faith/religion: Darrick Meigs How does patient's faith help to cope with current illness?: UTA  Leisure/Recreation: Leisure and Hobbies: Has previously said she liked hanging out with friends;  however, now she reports having no friends.  Strengths/Needs: What is the patient's perception of their strengths?: Smart, independent Patient states they can use these personal strengths during their treatment to contribute to their recovery: Learn from mistakes Patient states these barriers may affect/interfere with their treatment:  None Patient states these barriers may affect their return to the community: None Other important information patient would like considered in planning for their treatment: Has been established with [facility] which has applied for her to have disability.  Discharge Plan: Currently receiving community mental health services: Yes (From Whom)([facility]) Patient states concerns and preferences for aftercare planning are: Stay with [place] Patient states they will know when they are safe and ready for discharge when: When has a place to stay Does patient have access to transportation?: No Does patient have financial barriers related to discharge medications?: Yes Patient description of barriers related to discharge medications: No income, although does now have Medicaid Plan for no access to transportation at discharge: To be assessed by CSW, calls to father and [name] Will patient be returning to same living situation after discharge?: Unsure  Summary/Recommendations: Summary and Recommendations (to be completed by the evaluator): [name] familiar to me from multiple hospitalizations at the [facility] behavioral health system both at the behavioral health hospital as well as [facility] who presented to the behavioral health hospital as a walk-in patient. She has a past psychiatric history significant for schizophrenia/schizoaffective disorder as well as polysubstance use disorders. Her primary substances of choice include methamphetamines as well as cocaine. During her previous  hospitalization she has been on multiple medications, referred to group homes, referred to shelters, and had ACTT services of which she always seems to be noncompliant with follow-up.Patient will benefit from crisis stabilization, medication evaluation, group therapy and psychoeducation, in addition to case management for discharge planning. At discharge it is recommended that Patient adhere to the established discharge plan and continue in treatment.

## 2020-05-10 LAB — TSH: TSH: 0.354 u[IU]/mL (ref 0.350–4.500)

## 2020-05-10 MED ORDER — HALOPERIDOL 5 MG PO TABS
10.0000 mg | ORAL_TABLET | Freq: Every day | ORAL | Status: DC
  Administered 2020-05-11: 10 mg via ORAL
  Filled 2020-05-10 (×3): qty 2

## 2020-05-10 MED ORDER — GABAPENTIN 300 MG PO CAPS
300.0000 mg | ORAL_CAPSULE | Freq: Three times a day (TID) | ORAL | Status: DC
  Administered 2020-05-10 – 2020-05-12 (×6): 300 mg via ORAL
  Filled 2020-05-10 (×13): qty 1

## 2020-05-10 MED ORDER — HALOPERIDOL 5 MG PO TABS
15.0000 mg | ORAL_TABLET | Freq: Every day | ORAL | Status: DC
  Administered 2020-05-11: 15 mg via ORAL
  Filled 2020-05-10 (×4): qty 3

## 2020-05-10 NOTE — Progress Notes (Signed)
Patient has been isolative to her room this shift.  Patient denies all psychiatric symptoms, but has been complaining of an upset stomach.  Patient stated "why I am taking haldol I fucking hate haldol."  Patient then took her medications kicked the trash can an walked back to her room.   Assess patient for safety offer medications as prescribed, engage patient in 1:1 staff talks.   Patient able to contract for safety.  Continue to monitor as planned.

## 2020-05-10 NOTE — Progress Notes (Signed)
[facility] MD Progress Note  05/10/2020 11:22 AM ALAYNAH SCHUTTER  MRN:  [id number] Subjective:  Pt is seen and examined today. Pt started screeming at this provider and states she wanted to sleep and doesn't want to talk. This provider went to her after sometimes but she is still irritable and doesn't want to talk much. Pt states her mood is "fine". She rates her mood at 5/10 (10 is the best mood). Pt slept well last night. Pt states her appetite is fair. She states she does have some anxiety. Currently,Pt denies any suicidal ideation, homicidal ideation. She endorses auditory hallucinations states she still hears demon and god talking. She endorses visual hallucinations states she sees black floating shadows and orbs. Pt denies any headache, nausea, vomiting, dizziness, chest pain, SOB, abdominal pain, diarrhea, and constipation. Pt denies any medication side effects and has been tolerating it well. Pt denies any concerns.  Objective : Sharlene Mccluskey is a 19 year old female with past psychiatric history significant for schizophrenia/schizoaffective disorder as well as polysubstance use disorders who presented to the behavioral health hospital as a walk-in patient with c/o auditory hallucinations. On examination, Pt is found sitting on her bed. Pt started screeming at this provider and states she wanted to sleep and doesn't want to talk. This provider went to her after sometimes but she is still irritable and doesn't want to talk much. She is anxious, dysphoric, and irritable. Her speech is normal with increased volume. She got irritable and started shouting at this provider that she doesn't want to talk and just wants to sleep. Her mood is irritable, anxious and dysphoric with labile affect. Her eye contact is minimal. She endorces AVH. Denies SI and HI. Blood pressure 96/74, pulse (!) 126, temperature (!) 97.4 F (36.3 C), temperature source Oral, resp. rate 18, height 5\' 3"  (1.6 m), weight 82.1 kg, SpO2 100  %. Nursing notes indicate that Pt slept for 8.5 hours. TSH- 0.354, Urine positive for THC, Benzo and Cocaine. Preg test- Negative Glucose- 101, Waiting for Depakote level CSW called Pt's father -He is aware of Pt's admission and privy to her on-going mental health issues. He stated that she continues to have difficulty in the community, specifically non-compliance with medications and substance use issues. He stated that she does not reside with him and call him from time to time. Per father, Pt is often verbally aggressive.  Principal Problem: Schizoaffective disorder ([place]) Diagnosis: Principal Problem:   Schizoaffective disorder ([place]) Active Problems:   Polysubstance abuse (H[facility])   Social anxiety disorder  Total Time spent with patient: 20 minutes  Past Psychiatric History: see H&P  Past Medical History:  Past Medical History:  Diagnosis Date  . Burning with urination 05/03/2015  . Contraceptive management 05/03/2015  . Depression   . Dysmenorrhea 12/30/2013  . Heroin addiction ([place])   . Menorrhagia 12/30/2013  . Menstrual extraction 12/30/2013  . Migraines   . Schizophrenia ([place])   . Social anxiety disorder 09/25/2016  . Suicidal ideations   . Vaginal odor 05/03/2015    Past Surgical History:  Procedure Laterality Date  . NO PAST SURGERIES     Family History:  Family History  Problem Relation Age of Onset  . Depression Mother   . Hypertension Father   . Hyperlipidemia Father   . Cancer Paternal Grandmother        breast, uterine  . Cirrhosis Paternal Grandfather        due to alcohol   Family Psychiatric  History: see H&P Social History:  Social History   Substance and Sexual Activity  Alcohol Use Yes     Social History   Substance and Sexual Activity  Drug Use Yes  . Types: Marijuana, Oxycodone, Cocaine    Social History   Socioeconomic History  . Marital status: Single    Spouse name: Not on file  . Number of children: Not on file  . Years of  education: Not on file  . Highest education level: Not on file  Occupational History  . Occupation: Unemployed  Tobacco Use  . Smoking status: Current Every Day Smoker    Packs/day: 1.00    Types: Cigarettes  . Smokeless tobacco: Never Used  Vaping Use  . Vaping Use: Never used  Substance and Sexual Activity  . Alcohol use: Yes  . Drug use: Yes    Types: Marijuana, Oxycodone, Cocaine  . Sexual activity: Not on file  Other Topics Concern  . Not on file  Social History Narrative   06/23/2019:  Pt stated that she is homeless, that she is a high school graduate, and that she is unemployed and not followed by any outpatient provider.      Lives with Dad. Mom has LGD, lives in [place]. 11th grader. Dog.   Social Determinants of Health   Financial Resource Strain:   . Difficulty of Paying Living Expenses: Not on file  Food Insecurity:   . Worried About Charity fundraiser in the Last Year: Not on file  . Ran Out of Food in the Last Year: Not on file  Transportation Needs:   . Lack of Transportation (Medical): Not on file  . Lack of Transportation (Non-Medical): Not on file  Physical Activity:   . Days of Exercise per Week: Not on file  . Minutes of Exercise per Session: Not on file  Stress:   . Feeling of Stress : Not on file  Social Connections:   . Frequency of Communication with Friends and Family: Not on file  . Frequency of Social Gatherings with Friends and Family: Not on file  . Attends Religious Services: Not on file  . Active Member of Clubs or Organizations: Not on file  . Attends Archivist Meetings: Not on file  . Marital Status: Not on file   Additional Social History:    Pain Medications: See MAR Prescriptions: See MAR Over the Counter: See MAR History of alcohol / drug use?: Yes Name of Substance 1: Cannabis 1 - Age of First Use: 14 1 - Amount (size/oz): Varies 1 - Frequency: Varies 1 - Duration: Ongoing 1 - Last Use / Amount: "3-4 days ago"                   Sleep: Good  Appetite:  Good  Current Medications: Current Facility-Administered Medications  Medication Dose Route Frequency Provider Last Rate Last Admin  . acetaminophen (TYLENOL) tablet 650 mg  650 mg Oral Q6H PRN Sharma Covert, MD      . haloperidol (HALDOL) tablet 5 mg  5 mg Oral Q6H PRN Sharma Covert, MD       And  . benztropine (COGENTIN) tablet 1 mg  1 mg Oral Q6H PRN Sharma Covert, MD   1 mg at 05/09/20 0801  . benztropine (COGENTIN) tablet 2 mg  2 mg Oral BID PRN Mordecai Maes, NP      . diphenhydrAMINE (BENADRYL) injection 25 mg  25 mg Intramuscular Once Sharma Covert,  MD      . divalproex (DEPAKOTE) DR tablet 1,000 mg  1,000 mg Oral Q12H Mordecai Maes, NP   1,000 mg at 05/10/20 0853  . FLUoxetine (PROZAC) capsule 40 mg  40 mg Oral Daily Mordecai Maes, NP   40 mg at 05/10/20 0854  . haloperidol (HALDOL) tablet 15 mg  15 mg Oral BID Mordecai Maes, NP   15 mg at 05/10/20 0854  . haloperidol lactate (HALDOL) injection 10 mg  10 mg Intramuscular Once Sharma Covert, MD      . hydrOXYzine (ATARAX/VISTARIL) tablet 50 mg  50 mg Oral TID PRN Mordecai Maes, NP      . LORazepam (ATIVAN) injection 2 mg  2 mg Intramuscular Once Sharma Covert, MD      . LORazepam (ATIVAN) tablet 1 mg  1 mg Oral Q6H PRN Sharma Covert, MD      . OLANZapine zydis (ZYPREXA) disintegrating tablet 10 mg  10 mg Oral Q8H PRN Sharma Covert, MD       And  . LORazepam (ATIVAN) tablet 1 mg  1 mg Oral Q6H PRN Sharma Covert, MD       And  . ziprasidone (GEODON) injection 20 mg  20 mg Intramuscular Q6H PRN Sharma Covert, MD      . nicotine polacrilex (NICORETTE) gum 2 mg  2 mg Oral PRN Sharma Covert, MD      . OLANZapine [facility]) tablet 5 mg  5 mg Oral Daily Mordecai Maes, NP   5 mg at 05/10/20 0854  . OLANZapine zydis (ZYPREXA) disintegrating tablet 15 mg  15 mg Oral QHS Mordecai Maes, NP   15 mg at 05/09/20 2049  .  pantoprazole (PROTONIX) EC tablet 40 mg  40 mg Oral QHS Mordecai Maes, NP   40 mg at 05/09/20 2049  . propranolol (INDERAL) tablet 10 mg  10 mg Oral TID Mordecai Maes, NP   10 mg at 05/10/20 0856  . traZODone (DESYREL) tablet 100 mg  100 mg Oral QHS PRN Mordecai Maes, NP        Lab Results:  Results for orders placed or performed during the hospital encounter of 05/08/20 (from the past 48 hour(s))  Respiratory Panel by RT PCR (Flu A&B, Covid) - Nasopharyngeal Swab     Status: None   Collection Time: 05/08/20  1:12 PM   Specimen: Nasopharyngeal Swab  Result Value Ref Range   SARS Coronavirus 2 by RT PCR NEGATIVE NEGATIVE    Comment: (NOTE) SARS-CoV-2 target nucleic acids are NOT DETECTED.  The SARS-CoV-2 RNA is generally detectable in upper respiratoy specimens during the acute phase of infection. The lowest concentration of SARS-CoV-2 viral copies this assay can detect is 131 copies/mL. A negative result does not preclude SARS-Cov-2 infection and should not be used as the sole basis for treatment or other patient management decisions. A negative result may occur with  improper specimen collection/handling, submission of specimen other than nasopharyngeal swab, presence of viral mutation(s) within the areas targeted by this assay, and inadequate number of viral copies (<131 copies/mL). A negative result must be combined with clinical observations, patient history, and epidemiological information. The expected result is Negative.  Fact Sheet for Patients:  PinkCheek.be  Fact Sheet for Healthcare Providers:  GravelBags.it  This test is no t yet approved or cleared by the [place] FDA and  has been authorized for detection and/or diagnosis of SARS-CoV-2 by FDA under an Emergency Use Authorization (EUA). This EUA  will remain  in effect (meaning this test can be used) for the duration of the COVID-19 declaration  under Section 564(b)(1) of the Act, 21 U.S.C. section 360bbb-3(b)(1), unless the authorization is terminated or revoked sooner.     Influenza A by PCR NEGATIVE NEGATIVE   Influenza B by PCR NEGATIVE NEGATIVE    Comment: (NOTE) The Xpert Xpress SARS-CoV-2/FLU/RSV assay is intended as an aid in  the diagnosis of influenza from Nasopharyngeal swab specimens and  should not be used as a sole basis for treatment. Nasal washings and  aspirates are unacceptable for Xpert Xpress SARS-CoV-2/FLU/RSV  testing.  Fact Sheet for Patients: PinkCheek.be  Fact Sheet for Healthcare Providers: GravelBags.it  This test is not yet approved or cleared by the [place] FDA and  has been authorized for detection and/or diagnosis of SARS-CoV-2 by  FDA under an Emergency Use Authorization (EUA). This EUA will remain  in effect (meaning this test can be used) for the duration of the  Covid-19 declaration under Section 564(b)(1) of the Act, 21  U.S.C. section 360bbb-3(b)(1), unless the authorization is  terminated or revoked. Performed at [facility], [name] [address] [place], [place] [address]   CBC     Status: None   Collection Time: 05/09/20  6:30 PM  Result Value Ref Range   WBC 7.8 4.0 - 10.5 K/uL   RBC 4.19 3.87 - 5.11 MIL/uL   Hemoglobin 12.1 12.0 - 15.0 g/dL   HCT 37.9 36 - 46 %   MCV 90.5 80.0 - 100.0 fL   MCH 28.9 26.0 - 34.0 pg   MCHC 31.9 30.0 - 36.0 g/dL   RDW 14.0 11.5 - 15.5 %   Platelets 291 150 - 400 K/uL   nRBC 0.0 0.0 - 0.2 %    Comment: Performed at [facility], [place] [address] [place], [place] [address]  Comprehensive metabolic panel     Status: Abnormal   Collection Time: 05/09/20  6:30 PM  Result Value Ref Range   Sodium 140 135 - 145 mmol/L   Potassium 4.3 3.5 - 5.1 mmol/L   Chloride 105 98 - 111 mmol/L   CO2 26 22 - 32 mmol/L   Glucose, Bld 101 (H) 70 - 99 mg/dL     Comment: Glucose reference range applies only to samples taken after fasting for at least 8 hours.   BUN 16 6 - 20 mg/dL   Creatinine, Ser 0.87 0.44 - 1.00 mg/dL   Calcium 8.9 8.9 - 10.3 mg/dL   Total Protein 6.9 6.5 - 8.1 g/dL   Albumin 3.6 3.5 - 5.0 g/dL   AST 14 (L) 15 - 41 U/L   ALT 20 0 - 44 U/L   Alkaline Phosphatase 78 38 - 126 U/L   Total Bilirubin 0.4 0.3 - 1.2 mg/dL   GFR, Estimated >60 >60 mL/min    Comment: (NOTE) Calculated using the CKD-EPI Creatinine Equation (2021)    Anion gap 9 5 - 15    Comment: Performed at [facility], [place] [address], [place], [place] [address]  Valproic acid level     Status: None   Collection Time: 05/09/20  6:30 PM  Result Value Ref Range   Valproic Acid Lvl 84 50.0 - 100.0 ug/mL    Comment: Performed at [facility], [place] [address] [place],  [address]  Pregnancy, urine     Status: None   Collection Time: 05/09/20  7:10 PM  Result  Value Ref Range   Preg Test, Ur NEGATIVE NEGATIVE    Comment:        THE SENSITIVITY OF THIS METHODOLOGY IS >20 mIU/mL. Performed at [facility], [place] [address], [place], [place] [address]   Urine rapid drug screen (hosp performed)not at [facility]     Status: Abnormal   Collection Time: 05/09/20  7:11 PM  Result Value Ref Range   Opiates NONE DETECTED NONE DETECTED   Cocaine POSITIVE (A) NONE DETECTED   Benzodiazepines POSITIVE (A) NONE DETECTED   Amphetamines NONE DETECTED NONE DETECTED   Tetrahydrocannabinol POSITIVE (A) NONE DETECTED   Barbiturates NONE DETECTED NONE DETECTED    Comment: (NOTE) DRUG SCREEN FOR MEDICAL PURPOSES ONLY.  IF CONFIRMATION IS NEEDED FOR ANY PURPOSE, NOTIFY LAB WITHIN 5 DAYS.  LOWEST DETECTABLE LIMITS FOR URINE DRUG SCREEN Drug Class                     Cutoff (ng/mL) Amphetamine and metabolites    1000 Barbiturate and metabolites    200 Benzodiazepine                 315 Tricyclics and metabolites      300 Opiates and metabolites        300 Cocaine and metabolites        300 THC                            50 Performed at [facility], [place] [address] [place], [place] [address]   TSH     Status: None   Collection Time: 05/10/20  6:45 AM  Result Value Ref Range   TSH 0.354 0.350 - 4.500 uIU/mL    Comment: Performed by a 3rd Generation assay with a functional sensitivity of <=0.01 uIU/mL. Performed at [facility], [place] [address], [place],  [address]     Blood Alcohol level:  Lab Results  Component Value Date   ETH <10 03/29/2020   ETH <10 09/32/6712    Metabolic Disorder Labs: Lab Results  Component Value Date   HGBA1C 5.5 03/17/2020   MPG 111.15 03/17/2020   MPG 108.28 10/16/2019   Lab Results  Component Value Date   PROLACTIN 113.0 (H) 11/11/2019   PROLACTIN 152.0 (H) 10/16/2019   Lab Results  Component Value Date   CHOL 138 10/16/2019   TRIG 60 10/16/2019   HDL 36 (L) 10/16/2019   CHOLHDL 3.8 10/16/2019   VLDL 12 10/16/2019   LDLCALC 90 10/16/2019   LDLCALC 96 05/17/2019    Physical Findings: AIMS: Facial and Oral Movements Muscles of Facial Expression: None, normal Lips and Perioral Area: None, normal Jaw: None, normal Tongue: None, normal,Extremity Movements Upper (arms, wrists, hands, fingers): None, normal Lower (legs, knees, ankles, toes): None, normal, Trunk Movements Neck, shoulders, hips: None, normal, Overall Severity Severity of abnormal movements (highest score from questions above): None, normal Incapacitation due to abnormal movements: None, normal Patient's awareness of abnormal movements (rate only patient's report): No Awareness, Dental Status Current problems with teeth and/or dentures?: No Does patient usually wear dentures?: No  CIWA:  CIWA-Ar Total: 4 COWS:  COWS Total Score: 5  Musculoskeletal: Strength & Muscle Tone: within normal limits Gait & Station: normal Patient leans:  N/A  Psychiatric Specialty Exam: Physical Exam Vitals and nursing note reviewed.  Constitutional:      General: She is not in acute distress.  Appearance: Normal appearance. She is not ill-appearing, toxic-appearing or diaphoretic.  HENT:     Head: Normocephalic and atraumatic.  Pulmonary:     Effort: Pulmonary effort is normal.  Neurological:     General: No focal deficit present.     Mental Status: She is alert.     Review of Systems  Constitutional: Negative for activity change, appetite change, chills, fatigue and fever.  Respiratory: Negative.   Cardiovascular: Negative.   Gastrointestinal: Negative.   Genitourinary: Negative.   Musculoskeletal: Negative.   Neurological: Negative.   Psychiatric/Behavioral: Positive for dysphoric mood and hallucinations. Negative for sleep disturbance and suicidal ideas. The patient is nervous/anxious.     Blood pressure 96/74, pulse (!) 126, temperature (!) 97.4 F (36.3 C), temperature source Oral, resp. rate 18, height 5\' 3"  (1.6 m), weight 82.1 kg, SpO2 100 %.Body mass index is 32.06 kg/m.  General Appearance: Disheveled  Eye Contact:  Minimal  Speech:  Normal Rate  Volume:  Increased  Mood:  Anxious, Dysphoric and Irritable  Affect:  Labile  Thought Process:  Disorganized and Descriptions of Associations: Loose  Orientation:  Negative  Thought Content:  Delusions, Hallucinations: Auditory Visual and Paranoid Ideation  Suicidal Thoughts:  No  Homicidal Thoughts:  No  Memory:  Immediate;   Poor Recent;   Poor  Judgement:  Impaired  Insight:  Lacking  Psychomotor Activity:  Increased  Concentration:  Concentration: Poor and Attention Span: Poor  Recall:  Poor  Fund of Knowledge:  Poor  Language:  Fair  Akathisia:  Negative  Handed:  Right  AIMS (if indicated):     Assets:  Desire for Improvement Resilience  ADL's:  Intact  Cognition:  WNL  Sleep:  Number of Hours: 8.5     Treatment Plan Summary:Shalice Kisner is a 19  year old female with past psychiatric history significant for schizophrenia/schizoaffective disorder as well as polysubstance use disorders who presented to the behavioral health hospital as a walk-in patient with c/o auditory hallucinations. On examination, Pt is found sitting on her bed. Pt started screeming at this provider and states she wanted to sleep and doesn't want to talk. This provider went to her after sometimes but she is still irritable and doesn't want to talk much. She is anxious, dysphoric, and irritable. Her speech is normal with increased volume. She got irritable and started shouting at this provider that she doesn't want to talk and just wants to sleep. Her mood is irritable, anxious and dysphoric with labile affect. Her eye contact is minimal. She endorces AVH. Denies SI and HI. Blood pressure 96/74, pulse (!) 126, temperature (!) 97.4 F (36.3 C), temperature source Oral, resp. rate 18, height 5\' 3"  (1.6 m), weight 82.1 kg, SpO2 100 %. Nursing notes indicate that Pt slept for 8.5 hours. TSH- 0.354, Urine positive for THC, Benzo and Cocaine. Preg test- Negative Glucose- 101, Waiting for Depakote level  Plan-  Daily contact with patient to assess and evaluate symptoms and progress in treatment - Monitor Vitals. -Monitor for withdrawal symptoms. -Monitor for medication side effects.  Scheduled medications  -Continue Depakote 100 mg BID for Mood stabilization. -Continue Haldol 15 mg BID for Psychosis. -Continue Prozac 40 mg Daily. -Continue Zyprexa 5 mg Daily and 15 mg Bed time. -Continue Protonix EC 40 mg at bed time. -Continue Propanolol 10 mg TID.  PRN Medications  -Continue Cogentin 2 mg BID Prn for EPS /Tremors. -Continue Haldol 5 mg and Cogentin PRN Q6H for Agitation. -Continue Ativan 1 mg  6 hourly PRN if CIWA >10 for withdrawal effects. -Continue Agitation protocol as needed with Zyprexa/Ativan/Geodone.  -Continue Hydroxyzine 50 mg TID PRN for  Anxiety. -Continue Trazodone 100 mg QHS PRN for sleep.  Psychosocial   -Patient will participate in the therapeutic group milieu. -Encourage medication Compliance. -CSW to help with placement.  Armando Reichert, MD 05/10/2020, 11:22 AM

## 2020-05-10 NOTE — [facility] Counselor (Signed)
FOLLOW-UP:   CSW phoned [facility] to notify them of Pt's admission as well as to colloborate regarding Pt's discharge. CSW left call back information.    Freddi Che, LCSW

## 2020-05-10 NOTE — Tx Team (Signed)
Interdisciplinary Treatment and Diagnostic Plan Update  05/10/2020 Time of Session: 9:30am Julia [name] MRN: [id number]  Principal Diagnosis: Schizoaffective disorder [facility])  Secondary Diagnoses: Principal Problem:   Schizoaffective disorder ([place]) Active Problems:   Polysubstance abuse ([place])   Social anxiety disorder   Current Medications:  Current Facility-Administered Medications  Medication Dose Route Frequency Provider Last Rate Last Admin  . acetaminophen (TYLENOL) tablet 650 mg  650 mg Oral Q6H PRN Sharma Covert, MD      . haloperidol (HALDOL) tablet 5 mg  5 mg Oral Q6H PRN Sharma Covert, MD       And  . benztropine (COGENTIN) tablet 1 mg  1 mg Oral Q6H PRN Sharma Covert, MD   1 mg at 05/09/20 0801  . benztropine (COGENTIN) tablet 2 mg  2 mg Oral BID PRN Mordecai Maes, NP      . diphenhydrAMINE (BENADRYL) injection 25 mg  25 mg Intramuscular Once Sharma Covert, MD      . divalproex (DEPAKOTE) DR tablet 1,000 mg  1,000 mg Oral Q12H Mordecai Maes, NP   1,000 mg at 05/10/20 0853  . FLUoxetine (PROZAC) capsule 40 mg  40 mg Oral Daily Mordecai Maes, NP   40 mg at 05/10/20 0854  . haloperidol (HALDOL) tablet 15 mg  15 mg Oral BID Mordecai Maes, NP   15 mg at 05/10/20 0854  . haloperidol lactate (HALDOL) injection 10 mg  10 mg Intramuscular Once Sharma Covert, MD      . hydrOXYzine (ATARAX/VISTARIL) tablet 50 mg  50 mg Oral TID PRN Mordecai Maes, NP      . LORazepam (ATIVAN) injection 2 mg  2 mg Intramuscular Once Sharma Covert, MD      . LORazepam (ATIVAN) tablet 1 mg  1 mg Oral Q6H PRN Sharma Covert, MD      . OLANZapine zydis (ZYPREXA) disintegrating tablet 10 mg  10 mg Oral Q8H PRN Sharma Covert, MD       And  . LORazepam (ATIVAN) tablet 1 mg  1 mg Oral Q6H PRN Sharma Covert, MD       And  . ziprasidone (GEODON) injection 20 mg  20 mg Intramuscular Q6H PRN Sharma Covert, MD      . nicotine polacrilex (NICORETTE)  gum 2 mg  2 mg Oral PRN Sharma Covert, MD      . OLANZapine [facility]) tablet 5 mg  5 mg Oral Daily Mordecai Maes, NP   5 mg at 05/10/20 0854  . OLANZapine zydis (ZYPREXA) disintegrating tablet 15 mg  15 mg Oral QHS Mordecai Maes, NP   15 mg at 05/09/20 2049  . pantoprazole (PROTONIX) EC tablet 40 mg  40 mg Oral QHS Mordecai Maes, NP   40 mg at 05/09/20 2049  . propranolol (INDERAL) tablet 10 mg  10 mg Oral TID Mordecai Maes, NP   10 mg at 05/10/20 0856  . traZODone (DESYREL) tablet 100 mg  100 mg Oral QHS PRN Mordecai Maes, NP       PTA Medications: Medications Prior to Admission  Medication Sig Dispense Refill Last Dose  . risperiDONE (RISPERDAL) 3 MG tablet Take 6 mg by mouth at bedtime.       Patient Stressors: Financial difficulties Health problems Legal issue Medication change or noncompliance Substance abuse  Patient Strengths: Ability for insight Communication skills  Treatment Modalities: Medication Management, Group therapy, Case management,  1 to 1 session with clinician, Psychoeducation, Recreational  therapy.   Physician Treatment Plan for Primary Diagnosis: Schizoaffective disorder ([place]) Long Term Goal(s): Improvement in symptoms so as ready for discharge Improvement in symptoms so as ready for discharge   Short Term Goals: Ability to identify changes in lifestyle to reduce recurrence of condition will improve Ability to verbalize feelings will improve Ability to demonstrate self-control will improve Ability to identify and develop effective coping behaviors will improve Ability to maintain clinical measurements within normal limits will improve Compliance with prescribed medications will improve Ability to identify triggers associated with substance abuse/mental health issues will improve Ability to identify changes in lifestyle to reduce recurrence of condition will improve Ability to verbalize feelings will improve Ability to demonstrate  self-control will improve Ability to identify and develop effective coping behaviors will improve Ability to maintain clinical measurements within normal limits will improve Compliance with prescribed medications will improve Ability to identify triggers associated with substance abuse/mental health issues will improve  Medication Management: Evaluate patient's response, side effects, and tolerance of medication regimen.  Therapeutic Interventions: 1 to 1 sessions, Unit Group sessions and Medication administration.  Evaluation of Outcomes: Not Met  Physician Treatment Plan for Secondary Diagnosis: Principal Problem:   Schizoaffective disorder ([place]) Active Problems:   Polysubstance abuse ([place])   Social anxiety disorder  Long Term Goal(s): Improvement in symptoms so as ready for discharge Improvement in symptoms so as ready for discharge   Short Term Goals: Ability to identify changes in lifestyle to reduce recurrence of condition will improve Ability to verbalize feelings will improve Ability to demonstrate self-control will improve Ability to identify and develop effective coping behaviors will improve Ability to maintain clinical measurements within normal limits will improve Compliance with prescribed medications will improve Ability to identify triggers associated with substance abuse/mental health issues will improve Ability to identify changes in lifestyle to reduce recurrence of condition will improve Ability to verbalize feelings will improve Ability to demonstrate self-control will improve Ability to identify and develop effective coping behaviors will improve Ability to maintain clinical measurements within normal limits will improve Compliance with prescribed medications will improve Ability to identify triggers associated with substance abuse/mental health issues will improve     Medication Management: Evaluate patient's response, side effects, and tolerance of  medication regimen.  Therapeutic Interventions: 1 to 1 sessions, Unit Group sessions and Medication administration.  Evaluation of Outcomes: Not Met   RN Treatment Plan for Primary Diagnosis: Schizoaffective disorder ([place]) Long Term Goal(s): Knowledge of disease and therapeutic regimen to maintain health will improve  Short Term Goals: Ability to remain free from injury will improve, Ability to demonstrate self-control, Ability to participate in decision making will improve, Ability to verbalize feelings will improve, Ability to identify and develop effective coping behaviors will improve and Compliance with prescribed medications will improve  Medication Management: RN will administer medications as ordered by provider, will assess and evaluate patient's response and provide education to patient for prescribed medication. RN will report any adverse and/or side effects to prescribing provider.  Therapeutic Interventions: 1 on 1 counseling sessions, Psychoeducation, Medication administration, Evaluate responses to treatment, Monitor vital signs and CBGs as ordered, Perform/monitor CIWA, COWS, AIMS and Fall Risk screenings as ordered, Perform wound care treatments as ordered.  Evaluation of Outcomes: Not Met   LCSW Treatment Plan for Primary Diagnosis: Schizoaffective disorder ([place]) Long Term Goal(s): Safe transition to appropriate next level of care at discharge, Engage patient in therapeutic group addressing interpersonal concerns.  Short Term Goals: Engage patient in aftercare  planning with referrals and resources, Increase social support, Increase emotional regulation, Facilitate acceptance of mental health diagnosis and concerns, Identify triggers associated with mental health/substance abuse issues and Increase skills for wellness and recovery  Therapeutic Interventions: Assess for all discharge needs, 1 to 1 time with Social worker, Explore available resources and support systems, Assess  for adequacy in community support network, Educate family and significant other(s) on suicide prevention, Complete Psychosocial Assessment, Interpersonal group therapy.  Evaluation of Outcomes: Not Met   Progress in Treatment: Attending groups: No. Participating in groups: No. Taking medication as prescribed: Yes. Toleration medication: Yes. Family/Significant other contact made: Yes, individual(s) contacted:  Father  Patient understands diagnosis: No. Discussing patient identified problems/goals with staff: Yes. Medical problems stabilized or resolved: Yes. Denies suicidal/homicidal ideation: Yes. Issues/concerns per patient self-inventory: No.   New problem(s) identified: No, Describe:  None   New Short Term/Long Term Goal(s): medication stabilization, elimination of SI thoughts, development of comprehensive mental wellness plan.   Patient Goals:  "To get housing and medications"   Discharge Plan or Barriers: Patient recently admitted. CSW will continue to follow and assess for appropriate referrals and possible discharge planning.   Reason for Continuation of Hospitalization: Hallucinations Medication stabilization  Estimated Length of Stay: 3 to 5 days   Attendees: Patient: Shizuko Wojdyla 05/10/2020   Physician: Myles Lipps, MD 05/10/2020   Nursing:  05/10/2020   RN Care Manager: 05/10/2020   Social Worker: Verdis Frederickson, [place] 05/10/2020   Recreational Therapist:  05/10/2020   Other:  05/10/2020   Other:  05/10/2020   Other: 05/10/2020      Scribe for Treatment Team: Darleen Crocker, [place] 05/10/2020 9:49 AM

## 2020-05-11 DIAGNOSIS — F250 Schizoaffective disorder, bipolar type: Secondary | ICD-10-CM | POA: Diagnosis not present

## 2020-05-11 DIAGNOSIS — F4010 Social phobia, unspecified: Secondary | ICD-10-CM | POA: Diagnosis not present

## 2020-05-11 DIAGNOSIS — F1910 Other psychoactive substance abuse, uncomplicated: Secondary | ICD-10-CM | POA: Diagnosis not present

## 2020-05-11 LAB — PROLACTIN: Prolactin: 142 ng/mL — ABNORMAL HIGH (ref 4.8–23.3)

## 2020-05-11 LAB — HEMOGLOBIN A1C
Hgb A1c MFr Bld: 5.4 % (ref 4.8–5.6)
Mean Plasma Glucose: 108 mg/dL

## 2020-05-11 MED ORDER — HALOPERIDOL DECANOATE 100 MG/ML IM SOLN
100.0000 mg | Freq: Once | INTRAMUSCULAR | Status: DC
  Filled 2020-05-11: qty 1

## 2020-05-11 MED ORDER — DIVALPROEX SODIUM 250 MG PO DR TAB
750.0000 mg | DELAYED_RELEASE_TABLET | Freq: Two times a day (BID) | ORAL | Status: DC
  Administered 2020-05-11 – 2020-05-17 (×12): 750 mg via ORAL
  Filled 2020-05-11 (×19): qty 3

## 2020-05-11 MED ORDER — HALOPERIDOL 5 MG PO TABS
5.0000 mg | ORAL_TABLET | Freq: Every day | ORAL | Status: DC
  Administered 2020-05-12 – 2020-05-13 (×2): 5 mg via ORAL
  Filled 2020-05-11 (×4): qty 1

## 2020-05-11 NOTE — Progress Notes (Signed)
   05/11/20 2200  Psych Admission Type (Psych Patients Only)  Admission Status Voluntary  Psychosocial Assessment  Patient Complaints Substance abuse;Suspiciousness  Eye Contact Brief  Facial Expression Sad  Affect Sad;Blunted  Speech Slow;Soft  Interaction Minimal  Motor Activity Slow  Appearance/Hygiene Unremarkable  Behavior Characteristics Cooperative  Mood Depressed  Thought Process  Coherency Blocking  Content WDL  Delusions None reported or observed  Perception WDL  Hallucination None reported or observed  Judgment Impaired  Confusion None  Danger to Self  Current suicidal ideation? Denies  Danger to Others  Danger to Others None reported or observed

## 2020-05-11 NOTE — [facility] Counselor (Signed)
CSW provided this patient with a list of [facility] and encouraged this patient to call to seek placement.  This patient is also interested in group home placement.    [name] MSW, LCSW Clincal Social Worker  [facility]

## 2020-05-11 NOTE — Progress Notes (Signed)
Adult Psychoeducational Group Note  Date:  05/11/2020 Time:  10:18 PM  Group Topic/Focus:  Wrap-Up Group:   The focus of this group is to help patients review their daily goal of treatment and discuss progress on daily workbooks.  Participation Level:  Minimal  Participation Quality:  Drowsy  Affect:  Blunted  Cognitive:  Lacking  Insight: None  Engagement in Group:  Poor  Modes of Intervention:  Education and Support  Additional Comments: Patient was in the group discussion but did not show any interest in participating in the discussion. Patient could state any goal she is working on planning to work on  [employer] 05/11/2020, 10:18 PM

## 2020-05-11 NOTE — Progress Notes (Signed)
[facility] MD Progress Note  05/11/2020 10:00 AM VENESHA PETRAITIS  MRN:  [id number] Subjective:  Pt is seen and examined today. Pt is less irritable today and didn't scream or yell at the provider today. Pt states her mood islow. She rates her mood at3/10 (10 is the best mood). Pt slept well last night. Pt states her appetite isfair. She states she does have some anxiety and rates it 8/10.Currently,Pt denies any suicidal ideation, homicidal ideation. She endorses auditory hallucinations states she still hears demon and god talking. She endorses visual hallucinations states she sees black floating shadows and orbs. Pt denies any headache, nausea, vomiting, dizziness, chest pain, SOB, abdominal pain, diarrhea, and constipation. Pt denies any medication side effects and has been tolerating it well. Pt denies any concerns. Objective : Evyn Kooyman is a 19 year old female withpast psychiatric history significant for schizophrenia/schizoaffective disorder as well as polysubstance use disorders who presented to the behavioral health hospital as a walk-in patientwith c/o auditory hallucinations. On examination, Pt is found lying in her bed. She is less irritable than yesterday. She is not yelling and screeming. She is anxious depressed and dysphoric. Her speech is normal rate and volume. Her mood is anxious, depressed and dysphoric with constricted affect. Her eye contact is minimal. She endorces AVH. Denies SI and HI. Blood pressure 102/65, pulse 99, temperature (!) 97.4 F (36.3 C), temperature source Oral, resp. rate 18, height 5\' 3"  (1.6 m), weight 82.1 kg, SpO2 100 %. Nursing notes indicate that Pt slept for8.25hours. Yesterday evening, Pt had been isolating herself in the room and denied any psychiatric symptoms but has been complaining of an upset stomach.  Patient stated "why I am taking haldol I fucking hate haldol."  Patient then took her medications kicked the trash can an walked back to her room.  New  Labs--Her Prolactin is high at 142 and TSH- 0.354 , Urine positive for Benzo, Cocaine and TTC, Still waiting for Depakote level.  CSW phoned [facility] to notify them of Pt's admission as well as to colloborate regarding Pt's discharge.  Principal Problem: Schizoaffective disorder ([place]) Diagnosis: Principal Problem:   Schizoaffective disorder ([place]) Active Problems:   Polysubstance abuse (HCC)   Social anxiety disorder  Total Time spent with patient: 20 minutes  Past Psychiatric History: see H&P  Past Medical History:  Past Medical History:  Diagnosis Date  . Burning with urination 05/03/2015  . Contraceptive management 05/03/2015  . Depression   . Dysmenorrhea 12/30/2013  . Heroin addiction ([place])   . Menorrhagia 12/30/2013  . Menstrual extraction 12/30/2013  . Migraines   . Schizophrenia ([place])   . Social anxiety disorder 09/25/2016  . Suicidal ideations   . Vaginal odor 05/03/2015    Past Surgical History:  Procedure Laterality Date  . NO PAST SURGERIES     Family History:  Family History  Problem Relation Age of Onset  . Depression Mother   . Hypertension Father   . Hyperlipidemia Father   . Cancer Paternal Grandmother        breast, uterine  . Cirrhosis Paternal Grandfather        due to alcohol   Family Psychiatric  History: see H&P Social History:  Social History   Substance and Sexual Activity  Alcohol Use Yes     Social History   Substance and Sexual Activity  Drug Use Yes  . Types: Marijuana, Oxycodone, Cocaine    Social History   Socioeconomic History  . Marital status: Single  Spouse name: Not on file  . Number of children: Not on file  . Years of education: Not on file  . Highest education level: Not on file  Occupational History  . Occupation: Unemployed  Tobacco Use  . Smoking status: Current Every Day Smoker    Packs/day: 1.00    Types: Cigarettes  . Smokeless tobacco: Never Used  Vaping Use  . Vaping Use: Never used  Substance  and Sexual Activity  . Alcohol use: Yes  . Drug use: Yes    Types: Marijuana, Oxycodone, Cocaine  . Sexual activity: Not on file  Other Topics Concern  . Not on file  Social History Narrative   06/23/2019:  Pt stated that she is homeless, that she is a high school graduate, and that she is unemployed and not followed by any outpatient provider.      Lives with Dad. Mom has LGD, lives in [place]. 11th grader. Dog.   Social Determinants of Health   Financial Resource Strain:   . Difficulty of Paying Living Expenses: Not on file  Food Insecurity:   . Worried About Charity fundraiser in the Last Year: Not on file  . Ran Out of Food in the Last Year: Not on file  Transportation Needs:   . Lack of Transportation (Medical): Not on file  . Lack of Transportation (Non-Medical): Not on file  Physical Activity:   . Days of Exercise per Week: Not on file  . Minutes of Exercise per Session: Not on file  Stress:   . Feeling of Stress : Not on file  Social Connections:   . Frequency of Communication with Friends and Family: Not on file  . Frequency of Social Gatherings with Friends and Family: Not on file  . Attends Religious Services: Not on file  . Active Member of Clubs or Organizations: Not on file  . Attends Archivist Meetings: Not on file  . Marital Status: Not on file   Additional Social History:    Pain Medications: See MAR Prescriptions: See MAR Over the Counter: See MAR History of alcohol / drug use?: Yes Name of Substance 1: Cannabis 1 - Age of First Use: 14 1 - Amount (size/oz): Varies 1 - Frequency: Varies 1 - Duration: Ongoing 1 - Last Use / Amount: "3-4 days ago"                  Sleep: Good  Appetite:  Good  Current Medications: Current Facility-Administered Medications  Medication Dose Route Frequency Provider Last Rate Last Admin  . acetaminophen (TYLENOL) tablet 650 mg  650 mg Oral Q6H PRN Sharma Covert, MD      . haloperidol (HALDOL)  tablet 5 mg  5 mg Oral Q6H PRN Sharma Covert, MD       And  . benztropine (COGENTIN) tablet 1 mg  1 mg Oral Q6H PRN Sharma Covert, MD   1 mg at 05/09/20 0801  . benztropine (COGENTIN) tablet 2 mg  2 mg Oral BID PRN Mordecai Maes, NP      . diphenhydrAMINE (BENADRYL) injection 25 mg  25 mg Intramuscular Once Sharma Covert, MD      . divalproex (DEPAKOTE) DR tablet 750 mg  750 mg Oral Q12H Cammi Consalvo, MD      . FLUoxetine (PROZAC) capsule 40 mg  40 mg Oral Daily Mordecai Maes, NP   40 mg at 05/11/20 0910  . gabapentin (NEURONTIN) capsule 300 mg  300 mg  Oral TID Sharma Covert, MD   300 mg at 05/11/20 9702  . haloperidol (HALDOL) tablet 15 mg  15 mg Oral QHS Sharma Covert, MD      . Derrill Memo ON 05/12/2020] haloperidol (HALDOL) tablet 5 mg  5 mg Oral Daily Gillian Meeuwsen, MD      . haloperidol decanoate (HALDOL DECANOATE) 100 MG/ML injection 100 mg  100 mg Intramuscular Once Armando Reichert, MD      . haloperidol lactate (HALDOL) injection 10 mg  10 mg Intramuscular Once Sharma Covert, MD      . hydrOXYzine (ATARAX/VISTARIL) tablet 50 mg  50 mg Oral TID PRN Mordecai Maes, NP   50 mg at 05/10/20 1247  . LORazepam (ATIVAN) injection 2 mg  2 mg Intramuscular Once Sharma Covert, MD      . LORazepam (ATIVAN) tablet 1 mg  1 mg Oral Q6H PRN Sharma Covert, MD      . OLANZapine zydis [facility]) disintegrating tablet 10 mg  10 mg Oral Q8H PRN Sharma Covert, MD       And  . LORazepam (ATIVAN) tablet 1 mg  1 mg Oral Q6H PRN Sharma Covert, MD       And  . ziprasidone (GEODON) injection 20 mg  20 mg Intramuscular Q6H PRN Sharma Covert, MD      . nicotine polacrilex (NICORETTE) gum 2 mg  2 mg Oral PRN Sharma Covert, MD      . OLANZapine zydis (ZYPREXA) disintegrating tablet 15 mg  15 mg Oral QHS Mordecai Maes, NP   15 mg at 05/10/20 2125  . pantoprazole (PROTONIX) EC tablet 40 mg  40 mg Oral QHS Mordecai Maes, NP   40 mg at 05/10/20 2126  .  propranolol (INDERAL) tablet 10 mg  10 mg Oral TID Mordecai Maes, NP   10 mg at 05/11/20 0909  . traZODone (DESYREL) tablet 100 mg  100 mg Oral QHS PRN Mordecai Maes, NP        Lab Results:  Results for orders placed or performed during the hospital encounter of 05/08/20 (from the past 48 hour(s))  CBC     Status: None   Collection Time: 05/09/20  6:30 PM  Result Value Ref Range   WBC 7.8 4.0 - 10.5 K/uL   RBC 4.19 3.87 - 5.11 MIL/uL   Hemoglobin 12.1 12.0 - 15.0 g/dL   HCT 37.9 36 - 46 %   MCV 90.5 80.0 - 100.0 fL   MCH 28.9 26.0 - 34.0 pg   MCHC 31.9 30.0 - 36.0 g/dL   RDW 14.0 11.5 - 15.5 %   Platelets 291 150 - 400 K/uL   nRBC 0.0 0.0 - 0.2 %    Comment: Performed at [facility], [place] [address] [place], [place] [address]  Comprehensive metabolic panel     Status: Abnormal   Collection Time: 05/09/20  6:30 PM  Result Value Ref Range   Sodium 140 135 - 145 mmol/L   Potassium 4.3 3.5 - 5.1 mmol/L   Chloride 105 98 - 111 mmol/L   CO2 26 22 - 32 mmol/L   Glucose, Bld 101 (H) 70 - 99 mg/dL    Comment: Glucose reference range applies only to samples taken after fasting for at least 8 hours.   BUN 16 6 - 20 mg/dL   Creatinine, Ser 0.87 0.44 - 1.00 mg/dL   Calcium 8.9 8.9 - 10.3 mg/dL   Total Protein 6.9  6.5 - 8.1 g/dL   Albumin 3.6 3.5 - 5.0 g/dL   AST 14 (L) 15 - 41 U/L   ALT 20 0 - 44 U/L   Alkaline Phosphatase 78 38 - 126 U/L   Total Bilirubin 0.4 0.3 - 1.2 mg/dL   GFR, Estimated >60 >60 mL/min    Comment: (NOTE) Calculated using the CKD-EPI Creatinine Equation (2021)    Anion gap 9 5 - 15    Comment: Performed at [facility], [place] [address] [place], [place] [address]  Hemoglobin A1c     Status: None   Collection Time: 05/09/20  6:30 PM  Result Value Ref Range   Hgb A1c MFr Bld 5.4 4.8 - 5.6 %    Comment: (NOTE)         Prediabetes: 5.7 - 6.4         Diabetes: >6.4         Glycemic control for adults with diabetes:  <7.0    Mean Plasma Glucose 108 mg/dL    Comment: (NOTE) Performed At: [facility] [place], [place] [id number] Rush Farmer MD YS:[id number]   Valproic acid level     Status: None   Collection Time: 05/09/20  6:30 PM  Result Value Ref Range   Valproic Acid Lvl 84 50.0 - 100.0 ug/mL    Comment: Performed at [facility], [place] [address] [place], [place] [address]  Pregnancy, urine     Status: None   Collection Time: 05/09/20  7:10 PM  Result Value Ref Range   Preg Test, Ur NEGATIVE NEGATIVE    Comment:        THE SENSITIVITY OF THIS METHODOLOGY IS >20 mIU/mL. Performed at [facility], [place] [address], [place], [place] [address]   Urine rapid drug screen (hosp performed)not at [facility]     Status: Abnormal   Collection Time: 05/09/20  7:11 PM  Result Value Ref Range   Opiates NONE DETECTED NONE DETECTED   Cocaine POSITIVE (A) NONE DETECTED   Benzodiazepines POSITIVE (A) NONE DETECTED   Amphetamines NONE DETECTED NONE DETECTED   Tetrahydrocannabinol POSITIVE (A) NONE DETECTED   Barbiturates NONE DETECTED NONE DETECTED    Comment: (NOTE) DRUG SCREEN FOR MEDICAL PURPOSES ONLY.  IF CONFIRMATION IS NEEDED FOR ANY PURPOSE, NOTIFY LAB WITHIN 5 DAYS.  LOWEST DETECTABLE LIMITS FOR URINE DRUG SCREEN Drug Class                     Cutoff (ng/mL) Amphetamine and metabolites    1000 Barbiturate and metabolites    200 Benzodiazepine                 254 Tricyclics and metabolites     300 Opiates and metabolites        300 Cocaine and metabolites        300 THC                            50 Performed at [facility], [place] [address], [place], [place] [address]   TSH     Status: None   Collection Time: 05/10/20  6:45 AM  Result Value Ref Range   TSH 0.354 0.350 - 4.500 uIU/mL    Comment: Performed by a 3rd Generation assay with a functional sensitivity of <=0.01 uIU/mL. Performed at [facility], [place] [address], [place], [place] [address]  Prolactin     Status: Abnormal   Collection Time: 05/10/20  6:45 AM  Result Value Ref Range   Prolactin 142.0 (H) 4.8 - 23.3 ng/mL    Comment: (NOTE) Performed At: [facility] [place], [place] [id number] Rush Farmer MD YC:[id number]     Blood Alcohol level:  Lab Results  Component Value Date   [facility] <10 03/29/2020   ETH <10 49/70/2637    Metabolic Disorder Labs: Lab Results  Component Value Date   HGBA1C 5.4 05/09/2020   MPG 108 05/09/2020   MPG 111.15 03/17/2020   Lab Results  Component Value Date   PROLACTIN 142.0 (H) 05/10/2020   PROLACTIN 113.0 (H) 11/11/2019   Lab Results  Component Value Date   CHOL 138 10/16/2019   TRIG 60 10/16/2019   HDL 36 (L) 10/16/2019   CHOLHDL 3.8 10/16/2019   VLDL 12 10/16/2019   LDLCALC 90 10/16/2019   LDLCALC 96 05/17/2019    Physical Findings: AIMS: Facial and Oral Movements Muscles of Facial Expression: None, normal Lips and Perioral Area: None, normal Jaw: None, normal Tongue: None, normal,Extremity Movements Upper (arms, wrists, hands, fingers): None, normal Lower (legs, knees, ankles, toes): None, normal, Trunk Movements Neck, shoulders, hips: None, normal, Overall Severity Severity of abnormal movements (highest score from questions above): None, normal Incapacitation due to abnormal movements: None, normal Patient's awareness of abnormal movements (rate only patient's report): No Awareness, Dental Status Current problems with teeth and/or dentures?: No Does patient usually wear dentures?: No  CIWA:  CIWA-Ar Total: 4 COWS:  COWS Total Score: 5  Musculoskeletal: Strength & Muscle Tone: within normal limits Gait & Station: normal Patient leans: N/A  Psychiatric Specialty Exam: Physical Exam Vitals and nursing note reviewed.  Constitutional:      General: She is not in acute distress.    Appearance: Normal appearance. She is not  ill-appearing, toxic-appearing or diaphoretic.  HENT:     Head: Normocephalic and atraumatic.  Neurological:     General: No focal deficit present.     Mental Status: She is alert and oriented to person, place, and time.     Review of Systems  Constitutional: Positive for fatigue. Negative for activity change, appetite change and chills.  Eyes: Negative for photophobia and visual disturbance.  Respiratory: Negative.   Cardiovascular: Negative.   Gastrointestinal: Negative.   Genitourinary: Negative.   Musculoskeletal: Negative.   Neurological: Negative.   Psychiatric/Behavioral: Positive for dysphoric mood and hallucinations. Negative for confusion, sleep disturbance and suicidal ideas. The patient is nervous/anxious.     Blood pressure 102/65, pulse 99, temperature (!) 97.4 F (36.3 C), temperature source Oral, resp. rate 18, height 5\' 3"  (1.6 m), weight 82.1 kg, SpO2 100 %.Body mass index is 32.06 kg/m.  General Appearance: Disheveled  Eye Contact:  Minimal  Speech:  Normal Rate  Volume:  Normal  Mood:  Anxious, Depressed and Dysphoric  Affect:  Constricted  Thought Process:  Disorganized and Descriptions of Associations: Loose  Orientation:  Full (Time, Place, and Person)  Thought Content:  Delusions and Hallucinations: Auditory Visual  Suicidal Thoughts:  No  Homicidal Thoughts:  No  Memory:  Immediate;   Poor Recent;   Poor  Judgement:  Impaired  Insight:  Lacking  Psychomotor Activity:  Normal  Concentration:  Concentration: Poor and Attention Span: Poor  Recall:  Poor  Fund of Knowledge:  Poor  Language:  Fair  Akathisia:  Negative  Handed:  Right  AIMS (if indicated):  Assets:  Desire for Improvement Resilience  ADL's:  Intact  Cognition:  WNL  Sleep:  Number of Hours: 8.25     Treatment Plan Summary:Luciel Shankles is a 19 year old female withpast psychiatric history significant for schizophrenia/schizoaffective disorder as well as polysubstance use  disorders who presented to the behavioral health hospital as a walk-in patientwith c/o auditory hallucinations. On examination, Pt is found lying in her bed. She is less irritable than yesterday. She is not yelling and screeming. She is anxious depressed and dysphoric. Her speech is normal rate and volume. Her mood is anxious, depressed and dysphoric with constricted affect. Her eye contact is minimal. She endorces AVH. Denies SI and HI. Blood pressure 102/65, pulse 99, temperature (!) 97.4 F (36.3 C), temperature source Oral, resp. rate 18, height 5\' 3"  (1.6 m), weight 82.1 kg, SpO2 100 %. Nursing notes indicate that Pt slept for8.25hours. Her Prolactin is high at 142 and TSH- 0.354 Urine positive for Benzo, Cocaine and TTC. Still waiting for Depakote level.   Plan-   Daily contact with patient to assess and evaluate symptoms and progress in treatment - Monitor Vitals. -Monitor for withdrawal symptoms. -Monitor for medication side effects.  Scheduled medications  -Give Long acting Haldol Decanoate 100 mg Today.  -Decrease Depakote to 750 mg BID for Mood stabilization. -Decrease  Haldol to 5 mg Daily and 15 mg nightly because of day time sedation. -Continue Prozac 40 mg Daily. -Continue Zyprexa 15 mg Bed time for psychosis. -Continue Protonix EC 40 mg at bed time. -Continue Propanolol 10 mg TID. -Continue Gabapentin 300 mg TID for Irritability.  PRN Medications  -Continue Cogentin 2 mg BID Prn for EPS /Tremors. -Continue Haldol 5 mg and Cogentin PRN Q6H for Agitation. -Continue Ativan 1 mg 6 hourly PRN if CIWA >10 for withdrawal effects. -Continue Agitation protocol as needed with Zyprexa/Ativan/Geodone. -ContinueHydroxyzine 50mg  TID PRN for Anxiety. -ContinueTrazodone 100mg  QHS PRN for sleep.  Psychosocial  -Patient will participate in the therapeutic group milieu. -Encourage medication Compliance. -CSW to help with placement.  Armando Reichert, MD 05/11/2020,  10:00 AM

## 2020-05-11 NOTE — Progress Notes (Signed)
Patient spent most of shift resting in bed, on approach she appears flat and sad, she is cooperative with treatment, she denies SI, HI and AVH. Patient has been calm and pleasant on the unit. She appears to be in bed resting quietly at this time.

## 2020-05-11 NOTE — Progress Notes (Signed)
Patient has been isolative to her room this shift.  Patient reported feeling lightheaded and needing to lay down.  Patient's vital signs were assessed and patient was noted to have a blood pressure of 60/40 with a heart rate of 100.  Patient was encouraged to drink fluids and the physician was notified.  Patient vital signs were rechecked after fluids and patient was noted to have a blood pressure of 112/73 and a pulse of 89.  Patient not noted to be in any distress.   Assess patient for safety.  Offer medications as prescribed, engage patient in 1:1 staff talks.   Patient able to contract for safety.   Continue to monitor as planned.

## 2020-05-12 MED ORDER — GABAPENTIN 400 MG PO CAPS
400.0000 mg | ORAL_CAPSULE | Freq: Three times a day (TID) | ORAL | Status: DC
  Administered 2020-05-12 – 2020-05-17 (×15): 400 mg via ORAL
  Filled 2020-05-12 (×24): qty 1

## 2020-05-12 NOTE — Progress Notes (Signed)
Patient has been visible on the unit attended groups and as been engaged in conversation with peers this shift.    Assess patient for safety.  Offer medications as prescribed, engage patient in 1:1 staff talks.   Patient able to contract for safety.   Continue to monitor as planned.

## 2020-05-12 NOTE — Progress Notes (Signed)
Adult Psychoeducational Group Note  Date:  05/12/2020 Time:  9:44 PM  Group Topic/Focus:  Wrap-Up Group:   The focus of this group is to help patients review their daily goal of treatment and discuss progress on daily workbooks.  Participation Level:  Minimal  Participation Quality:  Drowsy  Affect:  Anxious  Cognitive:  Confused  Insight: Limited  Engagement in Group:  Lacking  Modes of Intervention:  Education and Support  Additional Comments: Julia [name] did not contribute nor participate actively in the group  Julia [name] Plan 05/12/2020, 9:44 PM

## 2020-05-12 NOTE — NC FL2 (Signed)
[place] LEVEL OF CARE SCREENING TOOL     IDENTIFICATION  Patient Name: Julia [name] Birthdate: 2000/10/17 Sex: female Admission Date (Current Location): 05/08/2020  [place] and [place] Number:  Marina Gravel [id number] [place] and Address:  [facility], [place] [address] [place], [place] [address]      Provider Number: [id number]  Attending Physician Name and Address:  Sharma Covert, MD  Relative Name and Phone Number:  Father, Kenneshia Rehm ([phone])    Current Level of Care: Hospital Recommended Level of Care: [place] (Group home) Prior Approval Number:    Date Approved/Denied:   PASRR Number:    Discharge Plan: Other (Comment) ([facility]/Group Home)    Current Diagnoses: Patient Active Problem List   Diagnosis Date Noted  . Schizoaffective disorder ([place]) 05/08/2020  . Schizophrenia, paranoid ([place])   . Cocaine dependence with cocaine-induced psychotic disorder with complication ([place])   . Social anxiety disorder 09/25/2016  . Polysubstance abuse ([place])   . Dysmenorrhea 12/30/2013    Orientation RESPIRATION BLADDER Height & Weight     Self, Time, Situation, Place  Normal Continent Weight: 82.1 kg Height:  5\' 3"  (160 cm)  BEHAVIORAL SYMPTOMS/MOOD NEUROLOGICAL BOWEL NUTRITION STATUS      Continent Diet (Regular)  AMBULATORY STATUS COMMUNICATION OF NEEDS Skin   Independent Verbally Normal                       Personal Care Assistance Level of Assistance  Bathing, Feeding, Dressing, Total care Bathing Assistance: Independent Feeding assistance: Independent Dressing Assistance: Independent Total Care Assistance: Independent   Functional Limitations Info  Sight, Hearing, Speech Sight Info: Adequate Hearing Info: Adequate Speech Info: Adequate    SPECIAL CARE FACTORS FREQUENCY                       Contractures Contractures Info: Not present    Additional Factors Info  Allergies Code Status  Info: FULL Allergies Info: Abilify, Zoloft           Current Medications (05/12/2020):  This is the current hospital active medication list Current Facility-Administered Medications  Medication Dose Route Frequency Provider Last Rate Last Admin  . acetaminophen (TYLENOL) tablet 650 mg  650 mg Oral Q6H PRN Sharma Covert, MD      . haloperidol (HALDOL) tablet 5 mg  5 mg Oral Q6H PRN Sharma Covert, MD       And  . benztropine (COGENTIN) tablet 1 mg  1 mg Oral Q6H PRN Sharma Covert, MD   1 mg at 05/09/20 0801  . benztropine (COGENTIN) tablet 2 mg  2 mg Oral BID PRN Mordecai Maes, NP   2 mg at 05/11/20 2123  . diphenhydrAMINE (BENADRYL) injection 25 mg  25 mg Intramuscular Once Sharma Covert, MD      . divalproex (DEPAKOTE) DR tablet 750 mg  750 mg Oral Q12H Armando Reichert, MD   750 mg at 05/12/20 0934  . FLUoxetine (PROZAC) capsule 40 mg  40 mg Oral Daily Mordecai Maes, NP   40 mg at 05/12/20 0934  . gabapentin (NEURONTIN) capsule 300 mg  300 mg Oral TID Sharma Covert, MD   300 mg at 05/12/20 1125  . haloperidol (HALDOL) tablet 15 mg  15 mg Oral QHS Sharma Covert, MD   15 mg at 05/11/20 2121  . haloperidol (HALDOL) tablet 5 mg  5 mg Oral Daily Doda, Vandana,  MD   5 mg at 05/12/20 0934  . haloperidol decanoate (HALDOL DECANOATE) 100 MG/ML injection 100 mg  100 mg Intramuscular Once Armando Reichert, MD      . haloperidol lactate (HALDOL) injection 10 mg  10 mg Intramuscular Once Sharma Covert, MD      . hydrOXYzine (ATARAX/VISTARIL) tablet 50 mg  50 mg Oral TID PRN Mordecai Maes, NP   50 mg at 05/10/20 1247  . LORazepam (ATIVAN) injection 2 mg  2 mg Intramuscular Once Sharma Covert, MD      . LORazepam (ATIVAN) tablet 1 mg  1 mg Oral Q6H PRN Sharma Covert, MD      . OLANZapine zydis [facility]) disintegrating tablet 10 mg  10 mg Oral Q8H PRN Sharma Covert, MD       And  . LORazepam (ATIVAN) tablet 1 mg  1 mg Oral Q6H PRN Sharma Covert,  MD       And  . ziprasidone (GEODON) injection 20 mg  20 mg Intramuscular Q6H PRN Sharma Covert, MD      . nicotine polacrilex (NICORETTE) gum 2 mg  2 mg Oral PRN Sharma Covert, MD   2 mg at 05/12/20 1125  . OLANZapine zydis (ZYPREXA) disintegrating tablet 15 mg  15 mg Oral QHS Mordecai Maes, NP   15 mg at 05/11/20 2121  . pantoprazole (PROTONIX) EC tablet 40 mg  40 mg Oral QHS Mordecai Maes, NP   40 mg at 05/11/20 2121  . traZODone (DESYREL) tablet 100 mg  100 mg Oral QHS PRN Mordecai Maes, NP         Discharge Medications: Please see discharge summary for a list of discharge medications.  Relevant Imaging Results:  Relevant Lab Results:   Additional Information SSN: [id number]  Vassie Moselle, LCSW

## 2020-05-12 NOTE — [facility] Group Notes (Signed)
[facility] LCSW Group Therapy  05/12/2020 11:35 AM  Type of Therapy:  Group Therapy  Participation Level:  Minimal  Participation Quality:  Drowsy and Inattentive  Affect:  Flat  Cognitive:  Oriented  Insight:  Limited  Engagement in Therapy:  None  Modes of Intervention:  Activity, Discussion and Education  Summary of Progress/Problems: This patient joined group after it had already begun. This patient offered limited engagement in group and appeared drowsy throughout. This patient left group early.   Julien Berryman A Amber Guthridge 05/12/2020, 11:35 AM

## 2020-05-12 NOTE — Progress Notes (Signed)
[facility] MD Progress Note  05/12/2020 11:51 AM Julia [name]  MRN:  [id number] Subjective:  Pt is seen and examined today.Pt is still irritable today and started yelling and screaming when I asked her to lower down her blanket from her face. Pt states her mood islittle better today. Pt slept well last night. Pt states her appetite isfair.She states she does have some anxiety.She states she didn't take Haldol Deconate because it makes her muscle tense. She asked we can increase Neurontin. Currently,Pt denies any suicidal ideation, homicidal ideation. She denies any auditory hallucinations today but endorses visual hallucinations states she sees black floating shadows and orbs. Pt denies any headache, nausea, vomiting, dizziness, chest pain, SOB, abdominal pain, diarrhea, and constipation. Pt denies any medication side effects and has been tolerating it well. Pt denies any concerns. Objective: Julia [name] is a 19 year old female withpast psychiatric history significant for schizophrenia/schizoaffective disorder as well as polysubstance use disorders who presented to the behavioral health hospital as a walk-in patientwith c/o auditory hallucinations. On examination, Pt is found lying in her bed. She is still irritable. She started yelling and screaming when I asked her to lower down her blanket from her face. She is anxious depressed and dysphoric. Her speech isnormal rate and increased volume.Her mood is anxious, depressed and dysphoric with flat affect. Her eye contact is minimal. She endorces AVH. Denies SI and HI. Blood pressure (!) 60/42, pulse 100, temperature (!) 97.4 F (36.3 C), temperature source Oral, resp. rate 18, height 5\' 3"  (1.6 m), weight 82.1 kg, SpO2 100 %. Nursing notes indicate that 6.45. Nursing notes indicate Patient has been isolative to her room this shift. Patient reported feeling lightheaded and needing to lay down. Patient's vital signs were assessed and patient was noted to  have a blood pressure of 60/40 with a heart rate of 100.  This patient joined group after it had already begun. This patient offered limited engagement in group and appeared drowsy throughout. This patient left group early.  Pt refused Haldol Decanoate yesterday.  Principal Problem: Schizoaffective disorder ([place]) Diagnosis: Principal Problem:   Schizoaffective disorder ([place]) Active Problems:   Polysubstance abuse ([place])   Social anxiety disorder  Total Time spent with patient: 35 minutes  Past Psychiatric History: see H&P  Past Medical History:  Past Medical History:  Diagnosis Date   Burning with urination 05/03/2015   Contraceptive management 05/03/2015   Depression    Dysmenorrhea 12/30/2013   Heroin addiction ([place])    Menorrhagia 12/30/2013   Menstrual extraction 12/30/2013   Migraines    Schizophrenia ([place])    Social anxiety disorder 09/25/2016   Suicidal ideations    Vaginal odor 05/03/2015    Past Surgical History:  Procedure Laterality Date   NO PAST SURGERIES     Family History:  Family History  Problem Relation Age of Onset   Depression Mother    Hypertension Father    Hyperlipidemia Father    Cancer Paternal Grandmother        breast, uterine   Cirrhosis Paternal Grandfather        due to alcohol   Family Psychiatric  History: see H&P Social History:  Social History   Substance and Sexual Activity  Alcohol Use Yes     Social History   Substance and Sexual Activity  Drug Use Yes   Types: Marijuana, Oxycodone, Cocaine    Social History   Socioeconomic History   Marital status: Single    Spouse name: Not  on file   Number of children: Not on file   Years of education: Not on file   Highest education level: Not on file  Occupational History   Occupation: Unemployed  Tobacco Use   Smoking status: Current Every Day Smoker    Packs/day: 1.00    Types: Cigarettes   Smokeless tobacco: Never Used  Vaping Use   Vaping Use:  Never used  Substance and Sexual Activity   Alcohol use: Yes   Drug use: Yes    Types: Marijuana, Oxycodone, Cocaine   Sexual activity: Not on file  Other Topics Concern   Not on file  Social History Narrative   06/23/2019:  Pt stated that she is homeless, that she is a high school graduate, and that she is unemployed and not followed by any outpatient provider.      Lives with Dad. Mom has LGD, lives in [place]. 11th grader. Dog.   Social Determinants of Health   Financial Resource Strain:    Difficulty of Paying Living Expenses: Not on file  Food Insecurity:    Worried About Charity fundraiser in the Last Year: Not on file   [employer] of Food in the Last Year: Not on file  Transportation Needs:    Lack of Transportation (Medical): Not on file   Lack of Transportation (Non-Medical): Not on file  Physical Activity:    Days of Exercise per Week: Not on file   Minutes of Exercise per Session: Not on file  Stress:    Feeling of Stress : Not on file  Social Connections:    Frequency of Communication with Friends and Family: Not on file   Frequency of Social Gatherings with Friends and Family: Not on file   Attends Religious Services: Not on file   Active Member of Clubs or Organizations: Not on file   Attends Archivist Meetings: Not on file   Marital Status: Not on file   Additional Social History:    Pain Medications: See [facility] Prescriptions: See MAR Over the Counter: See MAR History of alcohol / drug use?: Yes Name of Substance 1: Cannabis 1 - Age of First Use: 14 1 - Amount (size/oz): Varies 1 - Frequency: Varies 1 - Duration: Ongoing 1 - Last Use / Amount: "3-4 days ago"                  Sleep: Good  Appetite:  Good  Current Medications: Current Facility-Administered Medications  Medication Dose Route Frequency Provider Last Rate Last Admin   acetaminophen (TYLENOL) tablet 650 mg  650 mg Oral Q6H PRN Sharma Covert, MD        haloperidol (HALDOL) tablet 5 mg  5 mg Oral Q6H PRN Sharma Covert, MD       And   benztropine (COGENTIN) tablet 1 mg  1 mg Oral Q6H PRN Sharma Covert, MD   1 mg at 05/09/20 0801   benztropine (COGENTIN) tablet 2 mg  2 mg Oral BID PRN Mordecai Maes, NP   2 mg at 05/11/20 2123   diphenhydrAMINE (BENADRYL) injection 25 mg  25 mg Intramuscular Once Sharma Covert, MD       divalproex (DEPAKOTE) DR tablet 750 mg  750 mg Oral Q12H Esraa Seres, MD   750 mg at 05/12/20 0934   FLUoxetine (PROZAC) capsule 40 mg  40 mg Oral Daily Mordecai Maes, NP   40 mg at 05/12/20 0934   gabapentin (NEURONTIN) capsule 300 mg  300 mg Oral TID Sharma Covert, MD   300 mg at 05/12/20 1125   haloperidol (HALDOL) tablet 15 mg  15 mg Oral QHS Sharma Covert, MD   15 mg at 05/11/20 2121   haloperidol (HALDOL) tablet 5 mg  5 mg Oral Daily Armando Reichert, MD   5 mg at 05/12/20 0934   haloperidol decanoate (HALDOL DECANOATE) 100 MG/ML injection 100 mg  100 mg Intramuscular Once Armando Reichert, MD       haloperidol lactate (HALDOL) injection 10 mg  10 mg Intramuscular Once Sharma Covert, MD       hydrOXYzine (ATARAX/VISTARIL) tablet 50 mg  50 mg Oral TID PRN Mordecai Maes, NP   50 mg at 05/10/20 1247   LORazepam (ATIVAN) injection 2 mg  2 mg Intramuscular Once Sharma Covert, MD       LORazepam (ATIVAN) tablet 1 mg  1 mg Oral Q6H PRN Sharma Covert, MD       OLANZapine zydis (ZYPREXA) disintegrating tablet 10 mg  10 mg Oral Q8H PRN Sharma Covert, MD       And   LORazepam (ATIVAN) tablet 1 mg  1 mg Oral Q6H PRN Sharma Covert, MD       And   ziprasidone (GEODON) injection 20 mg  20 mg Intramuscular Q6H PRN Sharma Covert, MD       nicotine polacrilex (NICORETTE) gum 2 mg  2 mg Oral PRN Sharma Covert, MD   2 mg at 05/12/20 1125   OLANZapine zydis (ZYPREXA) disintegrating tablet 15 mg  15 mg Oral QHS Mordecai Maes, NP   15 mg at 05/11/20 2121    pantoprazole (PROTONIX) EC tablet 40 mg  40 mg Oral QHS Mordecai Maes, NP   40 mg at 05/11/20 2121   traZODone (DESYREL) tablet 100 mg  100 mg Oral QHS PRN Mordecai Maes, NP        Lab Results: No results found for this or any previous visit (from the past 53 hour(s)).  Blood Alcohol level:  Lab Results  Component Value Date   ETH <10 03/29/2020   ETH <10 32/67/1245    Metabolic Disorder Labs: Lab Results  Component Value Date   HGBA1C 5.4 05/09/2020   MPG 108 05/09/2020   MPG 111.15 03/17/2020   Lab Results  Component Value Date   PROLACTIN 142.0 (H) 05/10/2020   PROLACTIN 113.0 (H) 11/11/2019   Lab Results  Component Value Date   CHOL 138 10/16/2019   TRIG 60 10/16/2019   HDL 36 (L) 10/16/2019   CHOLHDL 3.8 10/16/2019   VLDL 12 10/16/2019   LDLCALC 90 10/16/2019   LDLCALC 96 05/17/2019    Physical Findings: AIMS: Facial and Oral Movements Muscles of Facial Expression: None, normal Lips and Perioral Area: None, normal Jaw: None, normal Tongue: None, normal,Extremity Movements Upper (arms, wrists, hands, fingers): None, normal Lower (legs, knees, ankles, toes): None, normal, Trunk Movements Neck, shoulders, hips: None, normal, Overall Severity Severity of abnormal movements (highest score from questions above): None, normal Incapacitation due to abnormal movements: None, normal Patient's awareness of abnormal movements (rate only patient's report): No Awareness, Dental Status Current problems with teeth and/or dentures?: No Does patient usually wear dentures?: No  CIWA:  CIWA-Ar Total: 4 COWS:  COWS Total Score: 5  Musculoskeletal: Strength & Muscle Tone: within normal limits Gait & Station: normal Patient leans: N/A  Psychiatric Specialty Exam: Physical Exam Vitals and nursing note reviewed.  Constitutional:  General: She is not in acute distress.    Appearance: Normal appearance. She is obese. She is not ill-appearing, toxic-appearing or  diaphoretic.  HENT:     Head: Normocephalic and atraumatic.  Pulmonary:     Effort: Pulmonary effort is normal.     Breath sounds: Normal breath sounds.  Neurological:     General: No focal deficit present.     Mental Status: She is alert and oriented to person, place, and time.     Review of Systems  Constitutional: Negative for activity change and appetite change.  HENT: Negative.   Eyes: Negative for visual disturbance.  Respiratory: Negative.   Cardiovascular: Negative.   Gastrointestinal: Negative.   Genitourinary: Negative.   Psychiatric/Behavioral: Positive for dysphoric mood and hallucinations. Negative for agitation, decreased concentration, sleep disturbance and suicidal ideas. The patient is not nervous/anxious.     Blood pressure (!) 60/42, pulse 100, temperature (!) 97.4 F (36.3 C), temperature source Oral, resp. rate 18, height 5\' 3"  (1.6 m), weight 82.1 kg, SpO2 100 %.Body mass index is 32.06 kg/m.  General Appearance: Disheveled  Eye Contact:  Minimal  Speech:  Normal Rate  Volume:  Increased  Mood:  Anxious, Depressed and Dysphoric  Affect:  Flat  Thought Process:  Disorganized and Descriptions of Associations: Loose  Orientation:  Full (Time, Place, and Person)  Thought Content:  Delusions and Hallucinations: Visual  Suicidal Thoughts:  No  Homicidal Thoughts:  No  Memory:  Immediate;   Poor Recent;   Poor  Judgement:  Impaired  Insight:  Lacking  Psychomotor Activity:  Normal  Concentration:  Concentration: Poor and Attention Span: Poor  Recall:  Poor  Fund of Knowledge:  Poor  Language:  Fair  Akathisia:  Negative  Handed:  Right  AIMS (if indicated):     Assets:  Desire for Improvement Resilience  ADL's:  Intact  Cognition:  WNL  Sleep:  Number of Hours: 6.45     Treatment Plan Summary: Julia [name] is a 19 year old female withpast psychiatric history significant for schizophrenia/schizoaffective disorder as well as polysubstance use  disorders who presented to the behavioral health hospital as a walk-in patientwith c/o auditory hallucinations. On examination, Pt is found lying in her bed. She is still irritable than yesterday. She started yelling and screaming when I asked her to lower down her blanket from her face. She is anxious depressed and dysphoric. Her speech isnormal rate and increased volume.Her mood is anxious, depressed and dysphoric with flat affect. Her eye contact is minimal. She endorces AVH. Denies SI and HI. Blood pressure (!) 60/42, pulse 100, temperature (!) 97.4 F (36.3 C), temperature source Oral, resp. rate 18, height 5\' 3"  (1.6 m), weight 82.1 kg, SpO2 100 %. Plan-  Daily contact with patient to assess and evaluate symptoms and progress in treatment -Monitor Vitals. -Monitor for withdrawal symptoms. -Monitor for medication side effects.  Scheduled medications  -Pt refused Long acting Haldol Decanoate 100 mg yesterday.  -Continue Depakote to 750 mg BID for Mood stabilization. -Continue Haldol to 5 mg Daily and 15 mg nightly because of day time sedation. -Continue Prozac 40 mg Daily. -Continue Zyprexa 15 mg Bed time for psychosis. -Continue Protonix EC 40 mg at bed time. -Increase Gabapentin 400 mg TID for Irritability.  PRN Medications  -Continue Cogentin 2 mg BID Prn for EPS /Tremors. -Continue Haldol 5 mg and Cogentin PRN Q6H for Agitation. -Continue Ativan 1 mg 6 hourly PRN if CIWA >10 for withdrawal effects. -Continue  Agitation protocol as needed with Zyprexa/Ativan/Geodone. -ContinueHydroxyzine 50mg  TID PRN for Anxiety. -ContinueTrazodone 100mg  QHS PRN for sleep.  Psychosocial  -Patient will participate in the therapeutic group milieu. -Encourage medication Compliance. -CSW is looking for [place] hosue placement.   Armando Reichert, MD 05/12/2020, 11:51 AM

## 2020-05-13 MED ORDER — OLANZAPINE 10 MG PO TBDP
30.0000 mg | ORAL_TABLET | Freq: Every day | ORAL | Status: DC
  Administered 2020-05-13 – 2020-05-16 (×4): 30 mg via ORAL
  Filled 2020-05-13 (×8): qty 3

## 2020-05-13 MED ORDER — BUPROPION HCL ER (XL) 150 MG PO TB24
150.0000 mg | ORAL_TABLET | Freq: Every day | ORAL | Status: DC
  Administered 2020-05-14 – 2020-05-17 (×4): 150 mg via ORAL
  Filled 2020-05-13 (×8): qty 1

## 2020-05-13 NOTE — Progress Notes (Addendum)
[place] NOVEL CORONAVIRUS (COVID-19) DAILY CHECK-OFF SYMPTOMS - answer yes or no to each - every day NO YES  Have you had a fever in the past 24 hours?  . Fever (Temp > 37.80C / 100F) X   Have you had any of these symptoms in the past 24 hours? . New Cough .  Sore Throat  .  Shortness of Breath .  Difficulty Breathing .  Unexplained Body Aches   X   Have you had any one of these symptoms in the past 24 hours not related to allergies?   . Runny Nose .  Nasal Congestion .  Sneezing   X   If you have had runny nose, nasal congestion, sneezing in the past 24 hours, has it worsened?  X   EXPOSURES - check yes or no X   Have you traveled outside the state in the past 14 days?  X   Have you been in contact with someone with a confirmed diagnosis of COVID-19 or PUI in the past 14 days without wearing appropriate PPE?  X   Have you been living in the same home as a person with confirmed diagnosis of COVID-19 or a PUI (household contact)?    X   Have you been diagnosed with COVID-19?    X              What to do next: Answered NO to all: Answered YES to anything:   Proceed with unit schedule Follow the [facility] Inpatient Flowsheet.   Pt presents with blunted affect, fair eye contact and disorganized speech on interactions. Noted in groups briefly this morning then walked out stating "I don't want to be in there anymore. I feel like I've been in too many groups before since I have been in the hospital too many times". Rates her anxiety 5/10 and depression 6/10 with stressors being "I'm just worried about being homeless, the sex trafficking ring and the court cases about the world coming to an end stresses me out too". Denies SI, HI, AVH and physical pain or discomfort at time of assessment. Verbal outburst at a peer X1 this shift while pacing in hall but pt was redirectable.  Emotional support and encouragement offered to pt throughout this shift. Scheduled medications given as ordered. Safety  checks continues at Q 15 minutes intervals without self harm gestures or outburst to note thus far.  Pt is redirectable at this time. Compliant with medications without adverse drug reactions. Tolerates all PO intake well. Remains safe on unit.

## 2020-05-13 NOTE — [facility] Group Notes (Signed)
Psychoeducational Group Note  Date: 05-13-20 Time:  2992-4268  Group Topic/Focus:    Identifying Needs:   Did not attend   Paulino Rily

## 2020-05-13 NOTE — Progress Notes (Addendum)
[facility] MD Progress Note  05/13/2020 8:42 AM Julia [name]  MRN:  [id number]    Subjective: "I am still hearing voices saying, God is the devil"   Objective: Julia [name] is a 19 year old female withpast psychiatric history significant for schizophrenia/schizoaffective disorder as well as polysubstance use disorders who presented to the behavioral health hospital as a walk-in patientwith c/o auditory hallucinations. Pt is seen and examined today.Pt is still irritable today.  She is calm and cooperative during assessment, but then later is heard yelling and screaming in the hallway.  During assessment, patient states that she is continuing to hear voices which she states is the antichrist.  She endorses that she has been "snorting cocaine, because it makes [name] feel happy.  But when I am coming down off of it, it f--'s with my brain."  Patient states that she is hopeful that she could stop using cocaine and marijuana by staying at the [facility].  She reports that she does not want to move far away, and she still wants to be close to her dad.  Patient reports that she does not like taking Haldol or Haldol decanoate because it causes her muscles to be tense.  She believes that the gabapentin that has been added has helped with her muscle tension pains.  She states that she can tolerate Zyprexa, and is agreeable to increasing her Zyprexa dose.Pt denies any suicidal ideation, homicidal ideation. She continues to endorse visual hallucinations, states she sees black floating shadows and orbs. Pt denies any headache, nausea, vomiting, dizziness, chest pain, SOB, abdominal pain, diarrhea, and constipation. Pt denies any medication side effects and has been tolerating it well. Pt denies any other concerns. Blood pressure 110/73, pulse 99, temperature 98.1, temperature source Oral, resp. rate 18, height 5\' 3"  (1.6 m), weight 82.1 kg, SpO2 100 %. Nursing notes indicate that patient slept 6.45. Nursing notes  indicate Patient has been unpredictable, will be calm and then will be agitated and aggressive.  Patient has been attending groups with participation.  Principal Problem: Schizoaffective disorder ([place]) Diagnosis: Principal Problem:   Schizoaffective disorder ([place]) Active Problems:   Polysubstance abuse ([place])   Social anxiety disorder  Total Time spent with patient: 40 minutes  Past Psychiatric History: see H&P  Past Medical History:  Past Medical History:  Diagnosis Date   Burning with urination 05/03/2015   Contraceptive management 05/03/2015   Depression    Dysmenorrhea 12/30/2013   Heroin addiction ([place])    Menorrhagia 12/30/2013   Menstrual extraction 12/30/2013   Migraines    Schizophrenia ([place])    Social anxiety disorder 09/25/2016   Suicidal ideations    Vaginal odor 05/03/2015    Past Surgical History:  Procedure Laterality Date   NO PAST SURGERIES     Family History:  Family History  Problem Relation Age of Onset   Depression Mother    Hypertension Father    Hyperlipidemia Father    Cancer Paternal Grandmother        breast, uterine   Cirrhosis Paternal Grandfather        due to alcohol   Family Psychiatric  History: see H&P Social History:  Social History   Substance and Sexual Activity  Alcohol Use Yes     Social History   Substance and Sexual Activity  Drug Use Yes   Types: Marijuana, Oxycodone, Cocaine    Social History   Socioeconomic History   Marital status: Single    Spouse name: Not on  file   Number of children: Not on file   Years of education: Not on file   Highest education level: Not on file  Occupational History   Occupation: Unemployed  Tobacco Use   Smoking status: Current Every Day Smoker    Packs/day: 1.00    Types: Cigarettes   Smokeless tobacco: Never Used  Vaping Use   Vaping Use: Never used  Substance and Sexual Activity   Alcohol use: Yes   Drug use: Yes    Types: Marijuana,  Oxycodone, Cocaine   Sexual activity: Not on file  Other Topics Concern   Not on file  Social History Narrative   06/23/2019:  Pt stated that she is homeless, that she is a high school graduate, and that she is unemployed and not followed by any outpatient provider.      Lives with Dad. Mom has LGD, lives in [place]. 11th grader. Dog.   Social Determinants of Health   Financial Resource Strain:    Difficulty of Paying Living Expenses: Not on file  Food Insecurity:    Worried About Charity fundraiser in the Last Year: Not on file   [employer] of Food in the Last Year: Not on file  Transportation Needs:    Lack of Transportation (Medical): Not on file   Lack of Transportation (Non-Medical): Not on file  Physical Activity:    Days of Exercise per Week: Not on file   Minutes of Exercise per Session: Not on file  Stress:    Feeling of Stress : Not on file  Social Connections:    Frequency of Communication with Friends and Family: Not on file   Frequency of Social Gatherings with Friends and Family: Not on file   Attends Religious Services: Not on file   Active Member of Clubs or Organizations: Not on file   Attends Archivist Meetings: Not on file   Marital Status: Not on file   Additional Social History:    Pain Medications: See [facility] Prescriptions: See MAR Over the Counter: See MAR History of alcohol / drug use?: Yes Name of Substance 1: Cannabis 1 - Age of First Use: 14 1 - Amount (size/oz): Varies 1 - Frequency: Varies 1 - Duration: Ongoing 1 - Last Use / Amount: "3-4 days ago"                  Sleep: Good  Appetite:  Good  Current Medications: Current Facility-Administered Medications  Medication Dose Route Frequency Provider Last Rate Last Admin   acetaminophen (TYLENOL) tablet 650 mg  650 mg Oral Q6H PRN Sharma Covert, MD       haloperidol (HALDOL) tablet 5 mg  5 mg Oral Q6H PRN Sharma Covert, MD       And   benztropine  (COGENTIN) tablet 1 mg  1 mg Oral Q6H PRN Sharma Covert, MD   1 mg at 05/09/20 0801   benztropine (COGENTIN) tablet 2 mg  2 mg Oral BID PRN Mordecai Maes, NP   2 mg at 05/11/20 2123   diphenhydrAMINE (BENADRYL) injection 25 mg  25 mg Intramuscular Once Sharma Covert, MD       divalproex (DEPAKOTE) DR tablet 750 mg  750 mg Oral Q12H Doda, Vandana, MD   750 mg at 05/13/20 0834   FLUoxetine (PROZAC) capsule 40 mg  40 mg Oral Daily Mordecai Maes, NP   40 mg at 05/13/20 0834   gabapentin (NEURONTIN) capsule 400 mg  400 mg Oral TID Armando Reichert, MD   400 mg at 05/13/20 1027   haloperidol (HALDOL) tablet 15 mg  15 mg Oral QHS Sharma Covert, MD   15 mg at 05/11/20 2121   haloperidol (HALDOL) tablet 5 mg  5 mg Oral Daily Armando Reichert, MD   5 mg at 05/13/20 2536   haloperidol decanoate (HALDOL DECANOATE) 100 MG/ML injection 100 mg  100 mg Intramuscular Once Armando Reichert, MD       haloperidol lactate (HALDOL) injection 10 mg  10 mg Intramuscular Once Sharma Covert, MD       hydrOXYzine (ATARAX/VISTARIL) tablet 50 mg  50 mg Oral TID PRN Mordecai Maes, NP   50 mg at 05/12/20 2043   LORazepam (ATIVAN) injection 2 mg  2 mg Intramuscular Once Sharma Covert, MD       LORazepam (ATIVAN) tablet 1 mg  1 mg Oral Q6H PRN Sharma Covert, MD       OLANZapine zydis (ZYPREXA) disintegrating tablet 10 mg  10 mg Oral Q8H PRN Sharma Covert, MD       And   LORazepam (ATIVAN) tablet 1 mg  1 mg Oral Q6H PRN Sharma Covert, MD       And   ziprasidone (GEODON) injection 20 mg  20 mg Intramuscular Q6H PRN Sharma Covert, MD       nicotine polacrilex (NICORETTE) gum 2 mg  2 mg Oral PRN Sharma Covert, MD   2 mg at 05/12/20 1125   OLANZapine zydis (ZYPREXA) disintegrating tablet 15 mg  15 mg Oral QHS Mordecai Maes, NP   15 mg at 05/12/20 2042   pantoprazole (PROTONIX) EC tablet 40 mg  40 mg Oral QHS Mordecai Maes, NP   40 mg at 05/12/20 2043    traZODone (DESYREL) tablet 100 mg  100 mg Oral QHS PRN Mordecai Maes, NP        Lab Results: No results found for this or any previous visit (from the past 53 hour(s)).  Blood Alcohol level:  Lab Results  Component Value Date   ETH <10 03/29/2020   ETH <10 64/40/3474    Metabolic Disorder Labs: Lab Results  Component Value Date   HGBA1C 5.4 05/09/2020   MPG 108 05/09/2020   MPG 111.15 03/17/2020   Lab Results  Component Value Date   PROLACTIN 142.0 (H) 05/10/2020   PROLACTIN 113.0 (H) 11/11/2019   Lab Results  Component Value Date   CHOL 138 10/16/2019   TRIG 60 10/16/2019   HDL 36 (L) 10/16/2019   CHOLHDL 3.8 10/16/2019   VLDL 12 10/16/2019   LDLCALC 90 10/16/2019   LDLCALC 96 05/17/2019    Physical Findings: AIMS: Facial and Oral Movements Muscles of Facial Expression: None, normal Lips and Perioral Area: None, normal Jaw: None, normal Tongue: None, normal,Extremity Movements Upper (arms, wrists, hands, fingers): None, normal Lower (legs, knees, ankles, toes): None, normal, Trunk Movements Neck, shoulders, hips: None, normal, Overall Severity Severity of abnormal movements (highest score from questions above): None, normal Incapacitation due to abnormal movements: None, normal Patient's awareness of abnormal movements (rate only patient's report): No Awareness, Dental Status Current problems with teeth and/or dentures?: No Does patient usually wear dentures?: No  CIWA:  CIWA-Ar Total: 4 COWS:  COWS Total Score: 5  Musculoskeletal: Strength & Muscle Tone: within normal limits Gait & Station: normal Patient leans: N/A  Psychiatric Specialty Exam: Physical Exam Vitals and nursing note reviewed.  Constitutional:  General: She is not in acute distress.    Appearance: Normal appearance. She is obese. She is not ill-appearing, toxic-appearing or diaphoretic.  HENT:     Head: Normocephalic and atraumatic.     Nose: Nose normal.  Eyes:      Extraocular Movements: Extraocular movements intact.  Cardiovascular:     Rate and Rhythm: Normal rate.  Pulmonary:     Effort: Pulmonary effort is normal. No respiratory distress.     Breath sounds: Normal breath sounds.  Musculoskeletal:        General: Normal range of motion.     Cervical back: Normal range of motion.  Neurological:     General: No focal deficit present.     Mental Status: She is alert and oriented to person, place, and time.     Review of Systems  Constitutional: Negative for activity change and appetite change.  HENT: Negative.   Eyes: Negative for visual disturbance.  Respiratory: Negative.   Cardiovascular: Negative.   Gastrointestinal: Negative.   Genitourinary: Negative.   Musculoskeletal: Positive for myalgias.  Psychiatric/Behavioral: Positive for agitation, behavioral problems, confusion, decreased concentration, dysphoric mood and hallucinations. Negative for self-injury, sleep disturbance and suicidal ideas. The patient is not nervous/anxious.     Blood pressure (!) 60/42, pulse 100, temperature (!) 97.4 F (36.3 C), temperature source Oral, resp. rate 18, height 5\' 3"  (1.6 m), weight 82.1 kg, SpO2 100 %.Body mass index is 32.06 kg/m.  General Appearance: Disheveled  Eye Contact:  Minimal  Speech:  Normal Rate  Volume:  Decreased  Mood:  Anxious, Depressed and Dysphoric  Affect:  Flat  Thought Process:  Coherent and Descriptions of Associations: Tangential  Orientation:  Full (Time, Place, and Person)  Thought Content:  Delusions and Hallucinations: Auditory Visual  Suicidal Thoughts:  No  Homicidal Thoughts:  No  Memory:  Immediate;   Poor Recent;   Poor Remote;   Fair  Judgement:  Impaired  Insight:  Lacking  Psychomotor Activity:  Normal  Concentration:  Concentration: Poor and Attention Span: Poor  Recall:  Poor  Fund of Knowledge:  Poor  Language:  Fair  Akathisia:  Negative  Handed:  Right  AIMS (if indicated):     Assets:   Desire for Improvement Resilience  ADL's:  Intact  Cognition:  WNL  Sleep:  Number of Hours: 6.45     Treatment Plan Summary: Julia [name] is a 19 year old female withpast psychiatric history significant for schizophrenia/schizoaffective disorder as well as polysubstance use disorders who presented to the behavioral health hospital as a walk-in patientwith c/o auditory hallucinations.  Plan-  Daily contact with patient to assess and evaluate symptoms and progress in treatment -Monitor Vitals. -Monitor for withdrawal symptoms. -Monitor for medication side effects.  Scheduled medications: -Pt refused Long acting Haldol Decanoate 100 mg and p.o. Haldol -Discontinue Haldol due to patient's reported intolerance -Start Zyprexa 30 mg daily at bedtime for psychotic features and to improve sleep -Continue Depakote to 750 mg BID for Mood stabilization. -Continue Prozac 40 mg Daily. -Continue Zyprexa 15 mg Bed time for psychosis. -Continue Protonix EC 40 mg at bed time. -Continue gabapentin 400 mg TID for Irritability and neuromuscular pain. -Start Wellbutrin XL 150 mg for depression, patient's complaint of inattention/ADD symptoms, and to decrease substance use cravings.   PRN Medications  -Continue Cogentin 2 mg BID Prn for EPS /Tremors. -Continue Haldol 5 mg and Cogentin PRN Q6H for Agitation. -Discontinue Ativan 1 mg 6 hourly PRN if CIWA >10  for withdrawal effects-patient has not scored, and has been hospitalized 5 days. -Continue Agitation protocol as needed with Zyprexa/Ativan/Geodon. -ContinueHydroxyzine 50mg  TID PRN for Anxiety. -ContinueTrazodone 100mg  QHS PRN for sleep. -Continue Nicorette gum 2 mg as needed for smoking cessation -Start Wellbutrin XL 150 mg for depression, patient's complaint of poor attention/ADD symptoms Psychosocial  -Patient will participate in the therapeutic group milieu. -Encourage medication Compliance. -CSW is looking for [place]  placement.   Julia Hammock, MD 05/13/2020, 8:42 AM

## 2020-05-13 NOTE — Progress Notes (Addendum)
   05/12/20 2045  Psych Admission Type (Psych Patients Only)  Admission Status Voluntary  Psychosocial Assessment  Patient Complaints Substance abuse;Suspiciousness  Eye Contact Brief  Facial Expression Sad  Affect Sad  Speech Soft;Slow  Interaction Minimal  Motor Activity Slow  Appearance/Hygiene Unremarkable  Behavior Characteristics Cooperative  Mood Depressed  Thought Process  Coherency Blocking  Content WDL  Delusions None reported or observed  Perception WDL  Hallucination None reported or observed  Judgment Impaired  Confusion None  Danger to Self  Current suicidal ideation? Denies  Danger to Others  Danger to Others None reported or observed   Pt seen in hallway. Pt denies SI, HI, AVH. Pt rates pain 8/10 in lower back, stating that the pains are sharp and come and go. Pt rates depression 8/10. She refused her Haldol this evening stating, "I think it's making my muscles tense up." When asked if she has told this to provider, pt stated, "No not yet. I also wonder if they can increase my Prozac. I think I was taking 60 mg before. Can they do that again?" Pt told to speak with provider in the morning. Pt also wants to know if she can have Vistaril 50 mg at discharge. "It didn't seem to work for me before but I think it's working better now. I want to give it another try."

## 2020-05-13 NOTE — [facility] Group Notes (Signed)
.  Psychoeducational Group Note    Date: 05-13-20 Time: 0900-1000    Goal Setting   Purpose of Group: This group helps to provide patients with the steps of setting a goal that is specific, measurable, attainable, realistic and time specific. A discussion on how we keep ourselves stuck with negative self talk.    Participation Level:  Did not attend   Paulino Rily

## 2020-05-14 DIAGNOSIS — F251 Schizoaffective disorder, depressive type: Secondary | ICD-10-CM

## 2020-05-14 NOTE — Progress Notes (Signed)
   05/14/20 2251  Psych Admission Type (Psych Patients Only)  Admission Status Voluntary  Psychosocial Assessment  Patient Complaints None  Eye Contact Brief  Facial Expression Sad  Affect Sad  Speech Soft;Slow  Interaction Guarded;Forwards little  Motor Activity Slow  Appearance/Hygiene Unremarkable  Behavior Characteristics Cooperative  Mood Depressed  Thought Process  Coherency Blocking  Content WDL  Delusions None reported or observed  Perception WDL  Hallucination None reported or observed  Judgment Impaired  Confusion None  Danger to Self  Current suicidal ideation? Denies  Danger to Others  Danger to Others None reported or observed

## 2020-05-14 NOTE — Progress Notes (Signed)
D: Patient denies SI/AH/AVH. Pt. Denies anxiety and depression. Pt. Isolates in room.  A:  Patient took scheduled medicine.  Support and encouragement provided Routine safety checks conducted every 15 minutes. Patient  Informed to notify staff with any concerns.   R: Safety maintained.

## 2020-05-14 NOTE — Progress Notes (Signed)
[facility] MD Progress Note  05/14/2020 11:53 AM Julia [name]  MRN:  [id number]   Subjective: Patient states " God is antichrist, Zahir told [name]". She states he is talking in her head right now and telling him same things. She states her appetite is good and she finished her breakfast this morning. She denies any suicidal or homicidal ideations today. She denies any visual hallucinations. She states her medications are working out good for her and she does not know " What is making [name] too tired to get up".   Objective: On approach patient was lying in bed and wanted to finish her assessment in the bed. She was calm, cooperative, oriented * 3. She was seen responding to the internal stimuli but does not presents irritable today. She does not show any self injurious behavior on the unit. She promises to attend group therapy later in the evening when she is not as much tired.  Blood pressure 110/73, pulse 99, temperature 98.1 F (36.7 C), temperature source Oral, resp. rate 16, height 5\' 3"  (1.6 m), weight 82.1 kg, SpO2 99 %.Body mass index is 32.06 kg/m. She slept 6. 45 hours last night.   Principal Problem: Schizoaffective disorder ([place]) Diagnosis: Principal Problem:   Schizoaffective disorder ([place]) Active Problems:   Polysubstance abuse ([place])   Social anxiety disorder  Total Time spent with patient: 25 minutes  Past Psychiatric History: see H&P  Past Medical History:  Past Medical History:  Diagnosis Date   Burning with urination 05/03/2015   Contraceptive management 05/03/2015   Depression    Dysmenorrhea 12/30/2013   Heroin addiction ([place])    Menorrhagia 12/30/2013   Menstrual extraction 12/30/2013   Migraines    Schizophrenia ([place])    Social anxiety disorder 09/25/2016   Suicidal ideations    Vaginal odor 05/03/2015    Past Surgical History:  Procedure Laterality Date   NO PAST SURGERIES     Family History:  Family History  Problem Relation Age of Onset    Depression Mother    Hypertension Father    Hyperlipidemia Father    Cancer Paternal Grandmother        breast, uterine   Cirrhosis Paternal Grandfather        due to alcohol   Family Psychiatric  History: see H&P Social History:  Social History   Substance and Sexual Activity  Alcohol Use Yes     Social History   Substance and Sexual Activity  Drug Use Yes   Types: Marijuana, Oxycodone, Cocaine    Social History   Socioeconomic History   Marital status: Single    Spouse name: Not on file   Number of children: Not on file   Years of education: Not on file   Highest education level: Not on file  Occupational History   Occupation: Unemployed  Tobacco Use   Smoking status: Current Every Day Smoker    Packs/day: 1.00    Types: Cigarettes   Smokeless tobacco: Never Used  Vaping Use   Vaping Use: Never used  Substance and Sexual Activity   Alcohol use: Yes   Drug use: Yes    Types: Marijuana, Oxycodone, Cocaine   Sexual activity: Not on file  Other Topics Concern   Not on file  Social History Narrative   06/23/2019:  Pt stated that she is homeless, that she is a high school graduate, and that she is unemployed and not followed by any outpatient provider.      Lives with Dad.  Mom has LGD, lives in [place]. 11th grader. Dog.   Social Determinants of Health   Financial Resource Strain:    Difficulty of Paying Living Expenses: Not on file  Food Insecurity:    Worried About Charity fundraiser in the Last Year: Not on file   [employer] of Food in the Last Year: Not on file  Transportation Needs:    Lack of Transportation (Medical): Not on file   Lack of Transportation (Non-Medical): Not on file  Physical Activity:    Days of Exercise per Week: Not on file   Minutes of Exercise per Session: Not on file  Stress:    Feeling of Stress : Not on file  Social Connections:    Frequency of Communication with Friends and Family: Not on file   Frequency  of Social Gatherings with Friends and Family: Not on file   Attends Religious Services: Not on file   Active Member of Clubs or Organizations: Not on file   Attends Archivist Meetings: Not on file   Marital Status: Not on file   Additional Social History:    Pain Medications: See [facility] Prescriptions: See MAR Over the Counter: See MAR History of alcohol / drug use?: Yes Name of Substance 1: Cannabis 1 - Age of First Use: 14 1 - Amount (size/oz): Varies 1 - Frequency: Varies 1 - Duration: Ongoing 1 - Last Use / Amount: "3-4 days ago"                  Sleep: Good  Appetite:  Good  Current Medications: Current Facility-Administered Medications  Medication Dose Route Frequency Provider Last Rate Last Admin   acetaminophen (TYLENOL) tablet 650 mg  650 mg Oral Q6H PRN Sharma Covert, MD       benztropine (COGENTIN) tablet 1 mg  1 mg Oral Q6H PRN Sharma Covert, MD   1 mg at 05/09/20 0801   benztropine (COGENTIN) tablet 2 mg  2 mg Oral BID PRN Mordecai Maes, NP   2 mg at 05/11/20 2123   buPROPion (WELLBUTRIN XL) 24 hr tablet 150 mg  150 mg Oral Daily [name], MD   150 mg at 05/14/20 0746   diphenhydrAMINE (BENADRYL) injection 25 mg  25 mg Intramuscular Once Sharma Covert, MD       divalproex (DEPAKOTE) DR tablet 750 mg  750 mg Oral Q12H Doda, Vandana, MD   750 mg at 05/14/20 0746   FLUoxetine (PROZAC) capsule 40 mg  40 mg Oral Daily Mordecai Maes, NP   40 mg at 05/14/20 0746   gabapentin (NEURONTIN) capsule 400 mg  400 mg Oral TID Armando Reichert, MD   400 mg at 05/14/20 0746   hydrOXYzine (ATARAX/VISTARIL) tablet 50 mg  50 mg Oral TID PRN Mordecai Maes, NP   50 mg at 05/13/20 1427   LORazepam (ATIVAN) injection 2 mg  2 mg Intramuscular Once Sharma Covert, MD       OLANZapine zydis (ZYPREXA) disintegrating tablet 10 mg  10 mg Oral Q8H PRN Sharma Covert, MD   10 mg at 05/13/20 1427   And   LORazepam (ATIVAN) tablet 1  mg  1 mg Oral Q6H PRN Sharma Covert, MD       And   ziprasidone (GEODON) injection 20 mg  20 mg Intramuscular Q6H PRN Sharma Covert, MD       nicotine polacrilex (NICORETTE) gum 2 mg  2 mg Oral PRN Clary,  Cordie Grice, MD   2 mg at 05/13/20 2007   OLANZapine zydis (ZYPREXA) disintegrating tablet 30 mg  30 mg Oral QHS [name], MD   30 mg at 05/13/20 2042   pantoprazole (PROTONIX) EC tablet 40 mg  40 mg Oral QHS Mordecai Maes, NP   40 mg at 05/13/20 2043   traZODone (DESYREL) tablet 100 mg  100 mg Oral QHS PRN Mordecai Maes, NP        Lab Results: No results found for this or any previous visit (from the past 48 hour(s)).  Blood Alcohol level:  Lab Results  Component Value Date   ETH <10 03/29/2020   ETH <10 87/86/7672    Metabolic Disorder Labs: Lab Results  Component Value Date   HGBA1C 5.4 05/09/2020   MPG 108 05/09/2020   MPG 111.15 03/17/2020   Lab Results  Component Value Date   PROLACTIN 142.0 (H) 05/10/2020   PROLACTIN 113.0 (H) 11/11/2019   Lab Results  Component Value Date   CHOL 138 10/16/2019   TRIG 60 10/16/2019   HDL 36 (L) 10/16/2019   CHOLHDL 3.8 10/16/2019   VLDL 12 10/16/2019   LDLCALC 90 10/16/2019   LDLCALC 96 05/17/2019    Physical Findings: AIMS: Facial and Oral Movements Muscles of Facial Expression: None, normal Lips and Perioral Area: None, normal Jaw: None, normal Tongue: None, normal,Extremity Movements Upper (arms, wrists, hands, fingers): None, normal Lower (legs, knees, ankles, toes): None, normal, Trunk Movements Neck, shoulders, hips: None, normal, Overall Severity Severity of abnormal movements (highest score from questions above): None, normal Incapacitation due to abnormal movements: None, normal Patient's awareness of abnormal movements (rate only patient's report): No Awareness, Dental Status Current problems with teeth and/or dentures?: No Does patient usually wear dentures?: No  CIWA:  CIWA-Ar  Total: 4 COWS:  COWS Total Score: 5  Musculoskeletal: Strength & Muscle Tone: within normal limits Gait & Station: normal Patient leans: N/A  Psychiatric Specialty Exam: Physical Exam Vitals and nursing note reviewed.  Constitutional:      General: She is not in acute distress.    Appearance: Normal appearance. She is obese. She is not ill-appearing, toxic-appearing or diaphoretic.  HENT:     Head: Normocephalic and atraumatic.  Pulmonary:     Effort: Pulmonary effort is normal.     Breath sounds: Normal breath sounds.  Neurological:     General: No focal deficit present.     Mental Status: She is alert and oriented to person, place, and time.  Psychiatric:        Attention and Perception: Attention normal. She is attentive. She perceives auditory hallucinations.        Mood and Affect: Mood is depressed.        Speech: Speech normal.        Behavior: Behavior is cooperative.        Thought Content: Thought content is paranoid.     Review of Systems  Constitutional: Negative for activity change and appetite change.  HENT: Negative.   Eyes: Negative for visual disturbance.  Respiratory: Negative.   Cardiovascular: Negative.   Gastrointestinal: Negative.   Genitourinary: Negative.   Psychiatric/Behavioral: Positive for dysphoric mood and hallucinations. Negative for agitation, decreased concentration, sleep disturbance and suicidal ideas. The patient is not nervous/anxious.     Blood pressure 110/73, pulse 99, temperature 98.1 F (36.7 C), temperature source Oral, resp. rate 16, height 5\' 3"  (1.6 m), weight 82.1 kg, SpO2 99 %.Body mass index is 32.06  kg/m.  General Appearance: Disheveled  Eye Contact:  Fair  Speech:  Normal Rate  Volume:  Normal  Mood:  Anxious, Depressed and Dysphoric  Affect:  Flat  Thought Process:  Disorganized and Descriptions of Associations: Loose  Orientation:  Full (Time, Place, and Person)  Thought Content:  Delusions and Hallucinations:  Auditory  Suicidal Thoughts:  No  Homicidal Thoughts:  No  Memory:  Immediate;   Poor Recent;   Poor  Judgement:  Impaired  Insight:  Lacking  Psychomotor Activity:  Normal  Concentration:  Concentration: Poor and Attention Span: Poor  Recall:  Poor  Fund of Knowledge:  Poor  Language:  Fair  Akathisia:  Negative  Handed:  Right  AIMS (if indicated):     Assets:  Desire for Improvement Resilience  ADL's:  Intact  Cognition:  WNL  Sleep:  Number of Hours: 6.45     Treatment Plan Summary: Julia [name] is a 19 year old female withpast psychiatric history significant for schizophrenia/schizoaffective disorder as well as polysubstance use disorders who presented to the behavioral health hospital as a walk-in patientwith c/o auditory hallucinations.  Plan-   Daily contact with patient to assess and evaluate symptoms and progress in treatment -Monitor Vitals. -Monitor for withdrawal symptoms. -Monitor for medication side effects.  Scheduled medications:  -Pt refused Long acting Haldol Decanoate 100 mg and p.o. Haldol -Continue Zyprexa 30 mg daily at bedtime for psychotic features and to improve sleep -Continue Depakote to 750 mg BID for Mood stabilization. -Continue Prozac 40 mg Daily. -Continue Zyprexa 30 mg Bed timefor psychosis. -Continue Protonix EC 40 mg at bed time. -Continue gabapentin 400 mg TID for Irritability and neuromuscular pain. -Continue  Wellbutrin XL 150 mg for depression, patient's complaint of inattention/ADD symptoms, and to decrease substance use cravings.   PRN Medications  -Continue Cogentin 2 mg BID Prn for EPS /Tremors. -Continue Haldol 5 mg and Cogentin PRN Q6H for Agitation. -Continue Agitation protocol as needed with Zyprexa/Ativan/Geodon. -ContinueHydroxyzine 50mg  TID PRN for Anxiety. -ContinueTrazodone 100mg  QHS PRN for sleep. -Continue Nicorette gum 2 mg as needed for smoking cessation  Psychosocial  -Patient will  participate in the therapeutic group milieu. -Encourage medication Compliance. -CSW is looking for [place] placement.   Honor Junes, MD 05/14/2020, 11:53 AM

## 2020-05-15 NOTE — Progress Notes (Signed)
[facility] MD Progress Note  05/15/2020 10:40 AM Julia [name]  MRN:  [id number] Subjective: [name] familiar to [name] from multiple psychiatric hospitalizations within the [facility] health system who presented to the behavioral health hospital as a walk-in on 05/08/2020.  She has a past psychiatric history significant for schizophrenia versus schizoaffective disorder as well as polysubstance use disorders.  She was significantly paranoid and agitated on admission.  Objective: Patient is seen and examined.  [name] with the above-stated past psychiatric history who is seen in follow-up.  She has improved over the weekend.  She denied any auditory or visual hallucinations.  She denied any suicidal or homicidal ideation.  Because of her homelessness she was active and attempted to get into an [facility].  She has been accepted into 1.  We discussed that today.  She is hoping to be able to go tomorrow.  Her vital signs are stable, she is afebrile.  She slept 7.25 hours last night.  She denied any side effects to her current medications.  Principal Problem: Schizoaffective disorder ([place]) Diagnosis: Principal Problem:   Schizoaffective disorder ([place]) Active Problems:   Polysubstance abuse ([place])   Social anxiety disorder  Total Time spent with patient: 20 minutes  Past Psychiatric History: See admission H&P  Past Medical History:  Past Medical History:  Diagnosis Date  . Burning with urination 05/03/2015  . Contraceptive management 05/03/2015  . Depression   . Dysmenorrhea 12/30/2013  . Heroin addiction ([place])   . Menorrhagia 12/30/2013  . Menstrual extraction 12/30/2013  . Migraines   . Schizophrenia ([place])   . Social anxiety disorder 09/25/2016  . Suicidal ideations   . Vaginal odor 05/03/2015    Past Surgical History:  Procedure Laterality Date  . NO PAST SURGERIES     Family History:  Family History  Problem Relation Age of Onset  .  Depression Mother   . Hypertension Father   . Hyperlipidemia Father   . Cancer Paternal Grandmother        breast, uterine  . Cirrhosis Paternal Grandfather        due to alcohol   Family Psychiatric  History: See admission H&P Social History:  Social History   Substance and Sexual Activity  Alcohol Use Yes     Social History   Substance and Sexual Activity  Drug Use Yes  . Types: Marijuana, Oxycodone, Cocaine    Social History   Socioeconomic History  . Marital status: Single    Spouse name: Not on file  . Number of children: Not on file  . Years of education: Not on file  . Highest education level: Not on file  Occupational History  . Occupation: Unemployed  Tobacco Use  . Smoking status: Current Every Day Smoker    Packs/day: 1.00    Types: Cigarettes  . Smokeless tobacco: Never Used  Vaping Use  . Vaping Use: Never used  Substance and Sexual Activity  . Alcohol use: Yes  . Drug use: Yes    Types: Marijuana, Oxycodone, Cocaine  . Sexual activity: Not on file  Other Topics Concern  . Not on file  Social History Narrative   06/23/2019:  Pt stated that she is homeless, that she is a high school graduate, and that she is unemployed and not followed by any outpatient provider.      Lives with Dad. Mom has LGD, lives in [place]. 11th grader. Dog.   Social Determinants of Health  Financial Resource Strain:   . Difficulty of Paying Living Expenses: Not on file  Food Insecurity:   . Worried About Charity fundraiser in the Last Year: Not on file  . Ran Out of Food in the Last Year: Not on file  Transportation Needs:   . Lack of Transportation (Medical): Not on file  . Lack of Transportation (Non-Medical): Not on file  Physical Activity:   . Days of Exercise per Week: Not on file  . Minutes of Exercise per Session: Not on file  Stress:   . Feeling of Stress : Not on file  Social Connections:   . Frequency of Communication with Friends and Family: Not on file  .  Frequency of Social Gatherings with Friends and Family: Not on file  . Attends Religious Services: Not on file  . Active Member of Clubs or Organizations: Not on file  . Attends Archivist Meetings: Not on file  . Marital Status: Not on file   Additional Social History:    Pain Medications: See MAR Prescriptions: See MAR Over the Counter: See MAR History of alcohol / drug use?: Yes Name of Substance 1: Cannabis 1 - Age of First Use: 14 1 - Amount (size/oz): Varies 1 - Frequency: Varies 1 - Duration: Ongoing 1 - Last Use / Amount: "3-4 days ago"                  Sleep: Good  Appetite:  Fair  Current Medications: Current Facility-Administered Medications  Medication Dose Route Frequency Provider Last Rate Last Admin  . acetaminophen (TYLENOL) tablet 650 mg  650 mg Oral Q6H PRN Sharma Covert, MD   650 mg at 05/14/20 1322  . benztropine (COGENTIN) tablet 1 mg  1 mg Oral Q6H PRN Sharma Covert, MD   1 mg at 05/09/20 0801  . benztropine (COGENTIN) tablet 2 mg  2 mg Oral BID PRN Mordecai Maes, NP   2 mg at 05/11/20 2123  . buPROPion (WELLBUTRIN XL) 24 hr tablet 150 mg  150 mg Oral Daily [name], MD   150 mg at 05/15/20 0933  . diphenhydrAMINE (BENADRYL) injection 25 mg  25 mg Intramuscular Once Sharma Covert, MD      . divalproex (DEPAKOTE) DR tablet 750 mg  750 mg Oral Q12H Armando Reichert, MD   750 mg at 05/15/20 0933  . FLUoxetine (PROZAC) capsule 40 mg  40 mg Oral Daily Mordecai Maes, NP   40 mg at 05/15/20 0933  . gabapentin (NEURONTIN) capsule 400 mg  400 mg Oral TID Armando Reichert, MD   400 mg at 05/15/20 0934  . hydrOXYzine (ATARAX/VISTARIL) tablet 50 mg  50 mg Oral TID PRN Mordecai Maes, NP   50 mg at 05/13/20 1427  . LORazepam (ATIVAN) injection 2 mg  2 mg Intramuscular Once Sharma Covert, MD      . OLANZapine zydis [facility]) disintegrating tablet 10 mg  10 mg Oral Q8H PRN Sharma Covert, MD   10 mg at 05/13/20 1427   And   . LORazepam (ATIVAN) tablet 1 mg  1 mg Oral Q6H PRN Sharma Covert, MD       And  . ziprasidone (GEODON) injection 20 mg  20 mg Intramuscular Q6H PRN Sharma Covert, MD      . nicotine polacrilex (NICORETTE) gum 2 mg  2 mg Oral PRN Sharma Covert, MD   2 mg at 05/13/20 2007  . OLANZapine zydis (  ZYPREXA) disintegrating tablet 30 mg  30 mg Oral QHS [name], MD   30 mg at 05/14/20 2038  . pantoprazole (PROTONIX) EC tablet 40 mg  40 mg Oral QHS Mordecai Maes, NP   40 mg at 05/14/20 2038  . traZODone (DESYREL) tablet 100 mg  100 mg Oral QHS PRN Mordecai Maes, NP        Lab Results: No results found for this or any previous visit (from the past 48 hour(s)).  Blood Alcohol level:  Lab Results  Component Value Date   ETH <10 03/29/2020   ETH <10 84/13/2440    Metabolic Disorder Labs: Lab Results  Component Value Date   HGBA1C 5.4 05/09/2020   MPG 108 05/09/2020   MPG 111.15 03/17/2020   Lab Results  Component Value Date   PROLACTIN 142.0 (H) 05/10/2020   PROLACTIN 113.0 (H) 11/11/2019   Lab Results  Component Value Date   CHOL 138 10/16/2019   TRIG 60 10/16/2019   HDL 36 (L) 10/16/2019   CHOLHDL 3.8 10/16/2019   VLDL 12 10/16/2019   LDLCALC 90 10/16/2019   LDLCALC 96 05/17/2019    Physical Findings: AIMS: Facial and Oral Movements Muscles of Facial Expression: None, normal Lips and Perioral Area: None, normal Jaw: None, normal Tongue: None, normal,Extremity Movements Upper (arms, wrists, hands, fingers): None, normal Lower (legs, knees, ankles, toes): None, normal, Trunk Movements Neck, shoulders, hips: None, normal, Overall Severity Severity of abnormal movements (highest score from questions above): None, normal Incapacitation due to abnormal movements: None, normal Patient's awareness of abnormal movements (rate only patient's report): No Awareness, Dental Status Current problems with teeth and/or dentures?: No Does patient usually wear  dentures?: No  CIWA:  CIWA-Ar Total: 4 COWS:  COWS Total Score: 5  Musculoskeletal: Strength & Muscle Tone: within normal limits Gait & Station: normal Patient leans: N/A  Psychiatric Specialty Exam: Physical Exam Vitals and nursing note reviewed.  Constitutional:      Appearance: Normal appearance.  HENT:     Head: Normocephalic and atraumatic.  Pulmonary:     Effort: Pulmonary effort is normal.  Neurological:     General: No focal deficit present.     Mental Status: She is alert and oriented to person, place, and time.     Review of Systems  Blood pressure 116/78, pulse (!) 118, temperature 97.6 F (36.4 C), temperature source Oral, resp. rate 16, height 5\' 3"  (1.6 m), weight 82.1 kg, SpO2 98 %.Body mass index is 32.06 kg/m.  General Appearance: Casual  Eye Contact:  Fair  Speech:  Normal Rate  Volume:  Normal  Mood:  Euthymic  Affect:  Congruent  Thought Process:  Coherent and Descriptions of Associations: Intact  Orientation:  Full (Time, Place, and Person)  Thought Content:  Logical  Suicidal Thoughts:  No  Homicidal Thoughts:  No  Memory:  Immediate;   Fair Recent;   Fair Remote;   Fair  Judgement:  Intact  Insight:  Lacking  Psychomotor Activity:  Normal  Concentration:  Concentration: Fair and Attention Span: Fair  Recall:  [employer] of Knowledge:  Fair  Language:  Fair  Akathisia:  Negative  Handed:  Right  AIMS (if indicated):     Assets:  Desire for Improvement Resilience  ADL's:  Intact  Cognition:  WNL  Sleep:  Number of Hours: 7.25     Treatment Plan Summary: Daily contact with patient to assess and evaluate symptoms and progress in treatment, Medication management and  Plan : Patient is seen and examined.  [name] with the above-stated past psychiatric history who is seen in follow-up.   Diagnosis: 1.  Schizoaffective disorder; bipolar type 2.  Polysubstance use disorders  Pertinent findings on examination  today: 1.  Decrease psychosis and paranoia. 2.  Sleep is improved. 3.  Compliance with medications. 4.  Apparently accepted into an [facility].  Plan: 1.  Continue Cogentin 2 mg p.o. twice daily as needed tremor. 2.  Continue Wellbutrin XL 150 mg p.o. daily for depression. 3.  Continue Depakote DR 750 mg every 12 hours for mood stability. 4.  Patient is still not had an EKG, and we will attempt to get that today. 5.  Continue fluoxetine 40 mg p.o. daily for depression and anxiety. 6.  Continue gabapentin 400 mg p.o. 3 times daily for mood stability and chronic pain. 7.  Continue hydroxyzine 50 mg p.o. 3 times daily as needed anxiety. 8.  Continue olanzapine agitation protocol as needed. 9.  Continue Zyprexa 30 mg p.o. nightly for psychosis and mood stability. 10.  Continue Protonix 40 mg p.o. daily for gastric protection. 11.  Continue trazodone 100 mg p.o. nightly as needed insomnia. 12.  CBC with differential, liver function enzymes and Depakote level in a.m. tomorrow. 13.  Disposition planning-patient has been accepted into a [facility], and we will pursue that when she is stable.  Sharma Covert, MD 05/15/2020, 10:40 AM

## 2020-05-15 NOTE — B[facility] Group Notes (Signed)
[facility] LCSW Group Therapy  05/15/2020 3:49 PM  Type of Therapy:  [facility] (CBT)   Participation Level:  Did Not Attend  Participation Quality:  Did not attend   Affect:  Did not attend   Cognitive:  Did not attend   Insight:  Did not attend   Engagement in Therapy:  Did not attend   Modes of Intervention:  Did not attend   Summary of Progress/Problems: Pt did not attend the group.  Darleen Crocker 05/15/2020, 3:49 PM

## 2020-05-15 NOTE — Progress Notes (Signed)
D: Patient denies SI/HI/AVH. Patient denies anxiety and depression. Patient out in open areas walking the halls. Pt. Switched from 500 to 400 hall. Patient reported that she is "happy" to go to [employer]. A:  Patient took scheduled medicine.  Support and encouragement provided Routine safety checks conducted every 15 minutes. Patient  Informed to notify staff with any concerns.   R: Safety maintained.

## 2020-05-15 NOTE — B[facility] Counselor (Signed)
CSW confirmed with Julia [name] [phone]) that this patient has been accepted to their [facility]. This patient is able to arrive on Wednesday 11/3 after 3:30pm.   [name] MSW, LCSW Clincal Social Worker  [facility]

## 2020-05-15 NOTE — [facility] Group Notes (Signed)
The focus of this group is to help patients establish daily goals to achieve during treatment and discuss how the patient can incorporate goal setting into their daily lives to aide in recovery.  Pt did not attend group 

## 2020-05-15 NOTE — Tx Team (Signed)
Interdisciplinary Treatment and Diagnostic Plan Update  05/15/2020 Time of Session: 9:30am Julia [name] MRN: [id number]  Principal Diagnosis: Schizoaffective disorder [facility])  Secondary Diagnoses: Principal Problem:   Schizoaffective disorder ([place]) Active Problems:   Polysubstance abuse ([place])   Social anxiety disorder   Current Medications:  Current Facility-Administered Medications  Medication Dose Route Frequency Provider Last Rate Last Admin  . acetaminophen (TYLENOL) tablet 650 mg  650 mg Oral Q6H PRN Sharma Covert, MD   650 mg at 05/14/20 1322  . benztropine (COGENTIN) tablet 1 mg  1 mg Oral Q6H PRN Sharma Covert, MD   1 mg at 05/09/20 0801  . benztropine (COGENTIN) tablet 2 mg  2 mg Oral BID PRN Mordecai Maes, NP   2 mg at 05/11/20 2123  . buPROPion (WELLBUTRIN XL) 24 hr tablet 150 mg  150 mg Oral Daily [name], MD   150 mg at 05/15/20 0933  . diphenhydrAMINE (BENADRYL) injection 25 mg  25 mg Intramuscular Once Sharma Covert, MD      . divalproex (DEPAKOTE) DR tablet 750 mg  750 mg Oral Q12H Armando Reichert, MD   750 mg at 05/15/20 0933  . FLUoxetine (PROZAC) capsule 40 mg  40 mg Oral Daily Mordecai Maes, NP   40 mg at 05/15/20 0933  . gabapentin (NEURONTIN) capsule 400 mg  400 mg Oral TID Armando Reichert, MD   400 mg at 05/15/20 0934  . hydrOXYzine (ATARAX/VISTARIL) tablet 50 mg  50 mg Oral TID PRN Mordecai Maes, NP   50 mg at 05/13/20 1427  . LORazepam (ATIVAN) injection 2 mg  2 mg Intramuscular Once Sharma Covert, MD      . OLANZapine zydis [facility]) disintegrating tablet 10 mg  10 mg Oral Q8H PRN Sharma Covert, MD   10 mg at 05/13/20 1427   And  . LORazepam (ATIVAN) tablet 1 mg  1 mg Oral Q6H PRN Sharma Covert, MD       And  . ziprasidone (GEODON) injection 20 mg  20 mg Intramuscular Q6H PRN Sharma Covert, MD      . nicotine polacrilex (NICORETTE) gum 2 mg  2 mg Oral PRN Sharma Covert, MD   2 mg at 05/13/20 2007  .  OLANZapine zydis (ZYPREXA) disintegrating tablet 30 mg  30 mg Oral QHS [name], MD   30 mg at 05/14/20 2038  . pantoprazole (PROTONIX) EC tablet 40 mg  40 mg Oral QHS Mordecai Maes, NP   40 mg at 05/14/20 2038  . traZODone (DESYREL) tablet 100 mg  100 mg Oral QHS PRN Mordecai Maes, NP       PTA Medications: Medications Prior to Admission  Medication Sig Dispense Refill Last Dose  . risperiDONE (RISPERDAL) 3 MG tablet Take 6 mg by mouth at bedtime.       Patient Stressors: Financial difficulties Health problems Legal issue Medication change or noncompliance Substance abuse  Patient Strengths: Ability for insight Communication skills  Treatment Modalities: Medication Management, Group therapy, Case management,  1 to 1 session with clinician, Psychoeducation, Recreational therapy.   Physician Treatment Plan for Primary Diagnosis: Schizoaffective disorder ([place]) Long Term Goal(s): Improvement in symptoms so as ready for discharge Improvement in symptoms so as ready for discharge   Short Term Goals: Ability to identify changes in lifestyle to reduce recurrence of condition will improve Ability to verbalize feelings will improve Ability to demonstrate self-control will improve Ability to identify and develop effective coping  behaviors will improve Ability to maintain clinical measurements within normal limits will improve Compliance with prescribed medications will improve Ability to identify triggers associated with substance abuse/mental health issues will improve Ability to identify changes in lifestyle to reduce recurrence of condition will improve Ability to verbalize feelings will improve Ability to demonstrate self-control will improve Ability to identify and develop effective coping behaviors will improve Ability to maintain clinical measurements within normal limits will improve Compliance with prescribed medications will improve Ability to identify triggers  associated with substance abuse/mental health issues will improve  Medication Management: Evaluate patient's response, side effects, and tolerance of medication regimen.  Therapeutic Interventions: 1 to 1 sessions, Unit Group sessions and Medication administration.  Evaluation of Outcomes: Adequate for Discharge  Physician Treatment Plan for Secondary Diagnosis: Principal Problem:   Schizoaffective disorder ([place]) Active Problems:   Polysubstance abuse ([place])   Social anxiety disorder  Long Term Goal(s): Improvement in symptoms so as ready for discharge Improvement in symptoms so as ready for discharge   Short Term Goals: Ability to identify changes in lifestyle to reduce recurrence of condition will improve Ability to verbalize feelings will improve Ability to demonstrate self-control will improve Ability to identify and develop effective coping behaviors will improve Ability to maintain clinical measurements within normal limits will improve Compliance with prescribed medications will improve Ability to identify triggers associated with substance abuse/mental health issues will improve Ability to identify changes in lifestyle to reduce recurrence of condition will improve Ability to verbalize feelings will improve Ability to demonstrate self-control will improve Ability to identify and develop effective coping behaviors will improve Ability to maintain clinical measurements within normal limits will improve Compliance with prescribed medications will improve Ability to identify triggers associated with substance abuse/mental health issues will improve     Medication Management: Evaluate patient's response, side effects, and tolerance of medication regimen.  Therapeutic Interventions: 1 to 1 sessions, Unit Group sessions and Medication administration.  Evaluation of Outcomes: Adequate for Discharge   RN Treatment Plan for Primary Diagnosis: Schizoaffective disorder ([place]) Long  Term Goal(s): Knowledge of disease and therapeutic regimen to maintain health will improve  Short Term Goals: Ability to remain free from injury will improve, Ability to demonstrate self-control, Ability to participate in decision making will improve, Ability to verbalize feelings will improve, Ability to identify and develop effective coping behaviors will improve and Compliance with prescribed medications will improve  Medication Management: RN will administer medications as ordered by provider, will assess and evaluate patient's response and provide education to patient for prescribed medication. RN will report any adverse and/or side effects to prescribing provider.  Therapeutic Interventions: 1 on 1 counseling sessions, Psychoeducation, Medication administration, Evaluate responses to treatment, Monitor vital signs and CBGs as ordered, Perform/monitor CIWA, COWS, AIMS and Fall Risk screenings as ordered, Perform wound care treatments as ordered.  Evaluation of Outcomes: Adequate for Discharge   LCSW Treatment Plan for Primary Diagnosis: Schizoaffective disorder [facility]) Long Term Goal(s): Safe transition to appropriate next level of care at discharge, Engage patient in therapeutic group addressing interpersonal concerns.  Short Term Goals: Engage patient in aftercare planning with referrals and resources, Increase social support, Increase emotional regulation, Facilitate acceptance of mental health diagnosis and concerns, Identify triggers associated with mental health/substance abuse issues and Increase skills for wellness and recovery  Therapeutic Interventions: Assess for all discharge needs, 1 to 1 time with Social worker, Explore available resources and support systems, Assess for adequacy in community support network, Educate family and  significant other(s) on suicide prevention, Complete Psychosocial Assessment, Interpersonal group therapy.  Evaluation of Outcomes: Adequate for  Discharge   Progress in Treatment: Attending groups: No. Participating in groups: No. Taking medication as prescribed: Yes. Toleration medication: Yes. Family/Significant other contact made: Yes, individual(s) contacted:  Father  Patient understands diagnosis: No. Discussing patient identified problems/goals with staff: Yes. Medical problems stabilized or resolved: Yes. Denies suicidal/homicidal ideation: Yes. Issues/concerns per patient self-inventory: No.   New problem(s) identified: No, Describe:  None   New Short Term/Long Term Goal(s): medication stabilization, elimination of SI thoughts, development of comprehensive mental wellness plan.   Patient Goals:  "To get housing and medications"   Discharge Plan or Barriers: Patient will continue ACTT services with [facility]. In addition Pt was able to secure placement at an [facility] upon discharge.   Reason for Continuation of Hospitalization: Hallucinations Medication stabilization  Estimated Length of Stay: 3 to 5 days   Attendees: Patient:  05/10/2020  Physician:  05/10/2020   Nursing:  05/10/2020   RN Care Manager: 05/10/2020   Social Worker: Freddi Che, LCSW 05/10/2020   Recreational Therapist:  05/10/2020   Other:  05/10/2020   Other:  05/10/2020   Other: 05/10/2020      Scribe for Treatment Team: Freddi Che, LCSW 05/15/2020 11:29 AM

## 2020-05-15 NOTE — Progress Notes (Signed)
The patient attended group but chose not to share.

## 2020-05-16 LAB — CBC WITH DIFFERENTIAL/PLATELET
Abs Immature Granulocytes: 0.07 10*3/uL (ref 0.00–0.07)
Basophils Absolute: 0 10*3/uL (ref 0.0–0.1)
Basophils Relative: 0 %
Eosinophils Absolute: 0.1 10*3/uL (ref 0.0–0.5)
Eosinophils Relative: 1 %
HCT: 39.9 % (ref 36.0–46.0)
Hemoglobin: 12.8 g/dL (ref 12.0–15.0)
Immature Granulocytes: 1 %
Lymphocytes Relative: 47 %
Lymphs Abs: 3.5 10*3/uL (ref 0.7–4.0)
MCH: 28.9 pg (ref 26.0–34.0)
MCHC: 32.1 g/dL (ref 30.0–36.0)
MCV: 90.1 fL (ref 80.0–100.0)
Monocytes Absolute: 0.7 10*3/uL (ref 0.1–1.0)
Monocytes Relative: 9 %
Neutro Abs: 3.3 10*3/uL (ref 1.7–7.7)
Neutrophils Relative %: 42 %
Platelets: 253 10*3/uL (ref 150–400)
RBC: 4.43 MIL/uL (ref 3.87–5.11)
RDW: 13.8 % (ref 11.5–15.5)
WBC: 7.8 10*3/uL (ref 4.0–10.5)
nRBC: 0 % (ref 0.0–0.2)

## 2020-05-16 LAB — COMPREHENSIVE METABOLIC PANEL
ALT: 15 U/L (ref 0–44)
AST: 22 U/L (ref 15–41)
Albumin: 3.8 g/dL (ref 3.5–5.0)
Alkaline Phosphatase: 82 U/L (ref 38–126)
Anion gap: 9 (ref 5–15)
BUN: 15 mg/dL (ref 6–20)
CO2: 24 mmol/L (ref 22–32)
Calcium: 9.3 mg/dL (ref 8.9–10.3)
Chloride: 105 mmol/L (ref 98–111)
Creatinine, Ser: 0.78 mg/dL (ref 0.44–1.00)
GFR, Estimated: >60 mL/min (ref >60)
Glucose, Bld: 92 mg/dL (ref 70–99)
Potassium: 3.9 mmol/L (ref 3.5–5.1)
Sodium: 138 mmol/L (ref 135–145)
Total Bilirubin: 0.5 mg/dL (ref 0.3–1.2)
Total Protein: 7.1 g/dL (ref 6.5–8.1)

## 2020-05-16 LAB — VALPROIC ACID LEVEL: Valproic Acid Lvl: 93 ug/mL (ref 50.0–100.0)

## 2020-05-16 NOTE — Progress Notes (Signed)
The patient verbalized that she had a good day overall but did not go into further detail. Her goal for tomorrow is to get discharged and to review her discharge plans.

## 2020-05-16 NOTE — Progress Notes (Addendum)
   05/16/20 2328  Psych Admission Type (Psych Patients Only)  Admission Status Voluntary  Psychosocial Assessment  Patient Complaints Anxiety  Eye Contact Brief  Facial Expression Anxious  Affect Anxious  Speech Soft;Slow  Interaction Guarded;Forwards little  Motor Activity Slow  Appearance/Hygiene Unremarkable  Behavior Characteristics Cooperative  Mood Anxious  Thought Process  Coherency WDL  Content WDL  Delusions None reported or observed  Perception WDL  Hallucination None reported or observed  Judgment Impaired  Confusion None  Danger to Self  Current suicidal ideation? Denies  Danger to Others  Danger to Others None reported or observed   Pt rates anxiety 7/10 and denies SI, HI and AVH. Pt is glad to be leaving facility tomorrow.

## 2020-05-16 NOTE — Progress Notes (Signed)
[facility] MD Progress Note  05/16/2020 12:12 PM Julia [name]  MRN:  [id number] Subjective:  [name] familiar to me from multiple psychiatric hospitalizations within the [place] health system who presented to the behavioral health hospital as a walk-in on 05/08/2020.  She has a past psychiatric history significant for schizophrenia versus schizoaffective disorder as well as polysubstance use disorders.  She was significantly paranoid and agitated on admission.  Objective: Patient is seen and examined. [name] with the above-stated past psychiatric history who is seen in follow-up. She is doing fine. She denied any auditory or visual hallucinations. She did appear to be responding to some degree of internal stimuli, but not to the degree of psychosis that have seen her in the past. She denied any suicidal or homicidal ideation. She stated she feels as though her medications are "just right". She is scheduled to go to the [facility]. She is happy about that. She denied any side effects to her current medications. Her blood pressure is a little low at 83/55. Pulse is normal. She is afebrile. She slept 6.25 hours last night. Labs from this morning showed a normal CBC with normal liver function enzymes. Valproic acid level today was 93.  Principal Problem: Schizoaffective disorder ([place]) Diagnosis: Principal Problem:   Schizoaffective disorder ([place]) Active Problems:   Polysubstance abuse ([place])   Social anxiety disorder  Total Time spent with patient: 20 minutes  Past Psychiatric History: See admission H&P  Past Medical History:  Past Medical History:  Diagnosis Date  . Burning with urination 05/03/2015  . Contraceptive management 05/03/2015  . Depression   . Dysmenorrhea 12/30/2013  . Heroin addiction ([place])   . Menorrhagia 12/30/2013  . Menstrual extraction 12/30/2013  . Migraines   . Schizophrenia ([place])   . Social anxiety disorder  09/25/2016  . Suicidal ideations   . Vaginal odor 05/03/2015    Past Surgical History:  Procedure Laterality Date  . NO PAST SURGERIES     Family History:  Family History  Problem Relation Age of Onset  . Depression Mother   . Hypertension Father   . Hyperlipidemia Father   . Cancer Paternal Grandmother        breast, uterine  . Cirrhosis Paternal Grandfather        due to alcohol   Family Psychiatric  History: See admission H&P Social History:  Social History   Substance and Sexual Activity  Alcohol Use Yes     Social History   Substance and Sexual Activity  Drug Use Yes  . Types: Marijuana, Oxycodone, Cocaine    Social History   Socioeconomic History  . Marital status: Single    Spouse name: Not on file  . Number of children: Not on file  . Years of education: Not on file  . Highest education level: Not on file  Occupational History  . Occupation: Unemployed  Tobacco Use  . Smoking status: Current Every Day Smoker    Packs/day: 1.00    Types: Cigarettes  . Smokeless tobacco: Never Used  Vaping Use  . Vaping Use: Never used  Substance and Sexual Activity  . Alcohol use: Yes  . Drug use: Yes    Types: Marijuana, Oxycodone, Cocaine  . Sexual activity: Not on file  Other Topics Concern  . Not on file  Social History Narrative   06/23/2019:  Pt stated that she is homeless, that she is a high school graduate, and that she  is unemployed and not followed by any outpatient provider.      Lives with Dad. Mom has LGD, lives in [place]. 11th grader. Dog.   Social Determinants of Health   Financial Resource Strain:   . Difficulty of Paying Living Expenses: Not on file  Food Insecurity:   . Worried About Charity fundraiser in the Last Year: Not on file  . Ran Out of Food in the Last Year: Not on file  Transportation Needs:   . Lack of Transportation (Medical): Not on file  . Lack of Transportation (Non-Medical): Not on file  Physical Activity:   . Days of  Exercise per Week: Not on file  . Minutes of Exercise per Session: Not on file  Stress:   . Feeling of Stress : Not on file  Social Connections:   . Frequency of Communication with Friends and Family: Not on file  . Frequency of Social Gatherings with Friends and Family: Not on file  . Attends Religious Services: Not on file  . Active Member of Clubs or Organizations: Not on file  . Attends Archivist Meetings: Not on file  . Marital Status: Not on file   Additional Social History:    Pain Medications: See MAR Prescriptions: See MAR Over the Counter: See MAR History of alcohol / drug use?: Yes Name of Substance 1: Cannabis 1 - Age of First Use: 14 1 - Amount (size/oz): Varies 1 - Frequency: Varies 1 - Duration: Ongoing 1 - Last Use / Amount: "3-4 days ago"                  Sleep: Good  Appetite:  Good  Current Medications: Current Facility-Administered Medications  Medication Dose Route Frequency Provider Last Rate Last Admin  . acetaminophen (TYLENOL) tablet 650 mg  650 mg Oral Q6H PRN Sharma Covert, MD   650 mg at 05/14/20 1322  . benztropine (COGENTIN) tablet 1 mg  1 mg Oral Q6H PRN Sharma Covert, MD   1 mg at 05/09/20 0801  . benztropine (COGENTIN) tablet 2 mg  2 mg Oral BID PRN Mordecai Maes, NP   2 mg at 05/11/20 2123  . buPROPion (WELLBUTRIN XL) 24 hr tablet 150 mg  150 mg Oral Daily [name], MD   150 mg at 05/16/20 0815  . diphenhydrAMINE (BENADRYL) injection 25 mg  25 mg Intramuscular Once Sharma Covert, MD      . divalproex (DEPAKOTE) DR tablet 750 mg  750 mg Oral Q12H Armando Reichert, MD   750 mg at 05/16/20 0815  . FLUoxetine (PROZAC) capsule 40 mg  40 mg Oral Daily Mordecai Maes, NP   40 mg at 05/16/20 0815  . gabapentin (NEURONTIN) capsule 400 mg  400 mg Oral TID Armando Reichert, MD   400 mg at 05/16/20 1150  . hydrOXYzine (ATARAX/VISTARIL) tablet 50 mg  50 mg Oral TID PRN Mordecai Maes, NP   50 mg at 05/13/20 1427   . LORazepam (ATIVAN) injection 2 mg  2 mg Intramuscular Once Sharma Covert, MD      . OLANZapine zydis [facility]) disintegrating tablet 10 mg  10 mg Oral Q8H PRN Sharma Covert, MD   10 mg at 05/13/20 1427   And  . LORazepam (ATIVAN) tablet 1 mg  1 mg Oral Q6H PRN Sharma Covert, MD       And  . ziprasidone (GEODON) injection 20 mg  20 mg Intramuscular Q6H PRN Myles Lipps  Kellie Simmering, MD      . nicotine polacrilex (NICORETTE) gum 2 mg  2 mg Oral PRN Sharma Covert, MD   2 mg at 05/15/20 1729  . OLANZapine zydis (ZYPREXA) disintegrating tablet 30 mg  30 mg Oral QHS [name], MD   30 mg at 05/15/20 2118  . pantoprazole (PROTONIX) EC tablet 40 mg  40 mg Oral QHS Mordecai Maes, NP   40 mg at 05/14/20 2038  . traZODone (DESYREL) tablet 100 mg  100 mg Oral QHS PRN Mordecai Maes, NP   100 mg at 05/15/20 2147    Lab Results:  Results for orders placed or performed during the hospital encounter of 05/08/20 (from the past 48 hour(s))  CBC with Differential/Platelet     Status: None   Collection Time: 05/16/20  6:24 AM  Result Value Ref Range   WBC 7.8 4.0 - 10.5 K/uL   RBC 4.43 3.87 - 5.11 MIL/uL   Hemoglobin 12.8 12.0 - 15.0 g/dL   HCT 39.9 36 - 46 %   MCV 90.1 80.0 - 100.0 fL   MCH 28.9 26.0 - 34.0 pg   MCHC 32.1 30.0 - 36.0 g/dL   RDW 13.8 11.5 - 15.5 %   Platelets 253 150 - 400 K/uL   nRBC 0.0 0.0 - 0.2 %   Neutrophils Relative % 42 %   Neutro Abs 3.3 1.7 - 7.7 K/uL   Lymphocytes Relative 47 %   Lymphs Abs 3.5 0.7 - 4.0 K/uL   Monocytes Relative 9 %   Monocytes Absolute 0.7 0.1 - 1.0 K/uL   Eosinophils Relative 1 %   Eosinophils Absolute 0.1 0.0 - 0.5 K/uL   Basophils Relative 0 %   Basophils Absolute 0.0 0.0 - 0.1 K/uL   Immature Granulocytes 1 %   Abs Immature Granulocytes 0.07 0.00 - 0.07 K/uL    Comment: Performed at [facility], [place] [address] [place], [place] [address]  Comprehensive metabolic panel     Status: None   Collection  Time: 05/16/20  6:24 AM  Result Value Ref Range   Sodium 138 135 - 145 mmol/L   Potassium 3.9 3.5 - 5.1 mmol/L   Chloride 105 98 - 111 mmol/L   CO2 24 22 - 32 mmol/L   Glucose, Bld 92 70 - 99 mg/dL    Comment: Glucose reference range applies only to samples taken after fasting for at least 8 hours.   BUN 15 6 - 20 mg/dL   Creatinine, Ser 0.78 0.44 - 1.00 mg/dL   Calcium 9.3 8.9 - 10.3 mg/dL   Total Protein 7.1 6.5 - 8.1 g/dL   Albumin 3.8 3.5 - 5.0 g/dL   AST 22 15 - 41 U/L   ALT 15 0 - 44 U/L   Alkaline Phosphatase 82 38 - 126 U/L   Total Bilirubin 0.5 0.3 - 1.2 mg/dL   GFR, Estimated >60 >60 mL/min    Comment: (NOTE) Calculated using the CKD-EPI Creatinine Equation (2021)    Anion gap 9 5 - 15    Comment: Performed at [facility], [place] [address], [place], [place] [address]  Valproic acid level     Status: None   Collection Time: 05/16/20  6:24 AM  Result Value Ref Range   Valproic Acid Lvl 93 50.0 - 100.0 ug/mL    Comment: Performed at [facility], [place] [address], [place], [place] [address]    Blood Alcohol level:  Lab Results  Component Value Date   ETH <10 03/29/2020   ETH <10 25/49/8264    Metabolic Disorder Labs: Lab Results  Component Value Date   HGBA1C 5.4 05/09/2020   MPG 108 05/09/2020   MPG 111.15 03/17/2020   Lab Results  Component Value Date   PROLACTIN 142.0 (H) 05/10/2020   PROLACTIN 113.0 (H) 11/11/2019   Lab Results  Component Value Date   CHOL 138 10/16/2019   TRIG 60 10/16/2019   HDL 36 (L) 10/16/2019   CHOLHDL 3.8 10/16/2019   VLDL 12 10/16/2019   LDLCALC 90 10/16/2019   LDLCALC 96 05/17/2019    Physical Findings: AIMS: Facial and Oral Movements Muscles of Facial Expression: None, normal Lips and Perioral Area: None, normal Jaw: None, normal Tongue: None, normal,Extremity Movements Upper (arms, wrists, hands, fingers): None, normal Lower (legs, knees, ankles, toes): None, normal, Trunk  Movements Neck, shoulders, hips: None, normal, Overall Severity Severity of abnormal movements (highest score from questions above): None, normal Incapacitation due to abnormal movements: None, normal Patient's awareness of abnormal movements (rate only patient's report): No Awareness, Dental Status Current problems with teeth and/or dentures?: No Does patient usually wear dentures?: No  CIWA:  CIWA-Ar Total: 4 COWS:  COWS Total Score: 5  Musculoskeletal: Strength & Muscle Tone: within normal limits Gait & Station: normal Patient leans: N/A  Psychiatric Specialty Exam: Physical Exam Vitals and nursing note reviewed.  Constitutional:      Appearance: Normal appearance.  HENT:     Head: Normocephalic and atraumatic.  Pulmonary:     Effort: Pulmonary effort is normal.  Neurological:     General: No focal deficit present.     Mental Status: She is alert and oriented to person, place, and time.     Review of Systems  Blood pressure (!) 83/55, pulse 99, temperature (!) 97.4 F (36.3 C), temperature source Oral, resp. rate 16, height 5\' 3"  (1.6 m), weight 82.1 kg, SpO2 98 %.Body mass index is 32.06 kg/m.  General Appearance: Casual  Eye Contact:  Fair  Speech:  Normal Rate  Volume:  Decreased  Mood:  Euthymic  Affect:  Flat  Thought Process:  Coherent and Descriptions of Associations: Circumstantial  Orientation:  Full (Time, Place, and Person)  Thought Content:  Hallucinations: Auditory and Paranoid Ideation  Suicidal Thoughts:  No  Homicidal Thoughts:  No  Memory:  Immediate;   Fair Recent;   Fair Remote;   Fair  Judgement:  Intact  Insight:  Fair  Psychomotor Activity:  Normal  Concentration:  Concentration: Fair and Attention Span: Fair  Recall:  [employer] of Knowledge:  Fair  Language:  Good  Akathisia:  Negative  Handed:  Right  AIMS (if indicated):     Assets:  Desire for Improvement Resilience  ADL's:  Intact  Cognition:  WNL  Sleep:  Number of Hours:  6.25     Treatment Plan Summary: Daily contact with patient to assess and evaluate symptoms and progress in treatment, Medication management and Plan : Patient is seen and examined. [name] with the above-stated past psychiatric history who is seen in follow-up.   Diagnosis: 1.  Schizoaffective disorder; bipolar type 2.  Polysubstance use disorders  Pertinent findings on examination today: 1. Decreased psychosis and paranoia. 2. Sleep is stable. 3. Compliance with medications. 4. Plan for discharge to an [facility]. 5. Valproic acid level 93, CBC as well as CMP are all normal.  Plan: 1.  Continue Cogentin 2  mg p.o. twice daily as needed tremor. 2.  Continue Wellbutrin XL 150 mg p.o. daily for depression. 3.  Continue Depakote DR 750 mg every 12 hours for mood stability. 4.  Patient is still not had an EKG, and we will attempt to get that today. 5.  Continue fluoxetine 40 mg p.o. daily for depression and anxiety. 6.  Continue gabapentin 400 mg p.o. 3 times daily for mood stability and chronic pain. 7.  Continue hydroxyzine 50 mg p.o. 3 times daily as needed anxiety. 8.  Continue olanzapine agitation protocol as needed. 9.  Continue Zyprexa 30 mg p.o. nightly for psychosis and mood stability. 10.  Continue Protonix 40 mg p.o. daily for gastric protection. 11.  Continue trazodone 100 mg p.o. nightly as needed insomnia. 12. Disposition planning-plan to discharge to [place] in a.m. tomorrow.  Sharma Covert, MD 05/16/2020, 12:12 PM

## 2020-05-16 NOTE — Progress Notes (Signed)
Patient presents with flat affect but brightens on approach, noted pacing hall back and forth most of early shift until bedtime. Patient reports she is ok she is just walking. Pt denies any SI, HI, AVH. Reports she is going to [place] on discharge. Pt in no distress this shift. Prn given for sleep with good relief.  Encouragement and support provided. Safety checks maintained. Medications given as prescribed. Pt receptive and remains safe on unit with q 15 min checks.

## 2020-05-16 NOTE — Progress Notes (Signed)
   05/16/20 1300  Psych Admission Type (Psych Patients Only)  Admission Status Voluntary  Psychosocial Assessment  Patient Complaints Anxiety  Eye Contact Brief  Facial Expression Anxious  Affect Anxious  Speech Soft;Slow  Interaction Guarded;Forwards little  Motor Activity Slow  Appearance/Hygiene Unremarkable  Behavior Characteristics Cooperative  Mood Anxious  Thought Process  Coherency WDL  Content WDL  Delusions None reported or observed  Perception WDL  Hallucination None reported or observed  Judgment Impaired  Confusion None  Danger to Self  Current suicidal ideation? Denies  Danger to Others  Danger to Others None reported or observed

## 2020-05-16 NOTE — Plan of Care (Signed)
  Problem: Education: Goal: Emotional status will improve Outcome: Progressing Goal: Mental status will improve Outcome: Progressing   Problem: Safety: Goal: Ability to remain free from injury will improve Outcome: Progressing   Problem: Coping: Goal: Ability to identify and develop effective coping behavior will improve Outcome: Progressing

## 2020-05-17 MED ORDER — HYDROXYZINE HCL 50 MG PO TABS: 50.0000 mg | ORAL_TABLET | Freq: Three times a day (TID) | ORAL | 0 refills | Status: DC | PRN

## 2020-05-17 MED ORDER — DIVALPROEX SODIUM 250 MG PO DR TAB: 750.0000 mg | DELAYED_RELEASE_TABLET | Freq: Two times a day (BID) | ORAL | 0 refills | Status: DC

## 2020-05-17 MED ORDER — OLANZAPINE 15 MG PO TBDP: 30.0000 mg | ORAL_TABLET | Freq: Every day | ORAL | 0 refills | Status: DC

## 2020-05-17 MED ORDER — HALOPERIDOL DECANOATE 100 MG/ML IM SOLN
50.0000 mg | Freq: Once | INTRAMUSCULAR | Status: AC
  Filled 2020-05-17: qty 0.5

## 2020-05-17 MED ORDER — BUPROPION HCL ER (XL) 150 MG PO TB24: 150.0000 mg | ORAL_TABLET | Freq: Every day | ORAL | 0 refills | Status: DC

## 2020-05-17 MED ORDER — GABAPENTIN 400 MG PO CAPS: 400.0000 mg | ORAL_CAPSULE | Freq: Three times a day (TID) | ORAL | 0 refills | Status: DC

## 2020-05-17 MED ORDER — HALOPERIDOL DECANOATE 100 MG/ML IM SOLN
INTRAMUSCULAR | Status: AC
  Administered 2020-05-17: 50 mg via INTRAMUSCULAR
  Filled 2020-05-17: qty 1

## 2020-05-17 MED ORDER — FLUOXETINE HCL 40 MG PO CAPS: 40.0000 mg | ORAL_CAPSULE | Freq: Every day | ORAL | 0 refills | Status: DC

## 2020-05-17 MED ORDER — PANTOPRAZOLE SODIUM 40 MG PO TBEC: 40.0000 mg | DELAYED_RELEASE_TABLET | Freq: Every day | ORAL | 0 refills | Status: DC

## 2020-05-17 MED ORDER — BENZTROPINE MESYLATE 2 MG PO TABS: 2.0000 mg | ORAL_TABLET | Freq: Two times a day (BID) | ORAL | 0 refills | Status: DC | PRN

## 2020-05-17 MED ORDER — TRAZODONE HCL 100 MG PO TABS: 100.0000 mg | ORAL_TABLET | Freq: Every evening | ORAL | 0 refills | Status: DC | PRN

## 2020-05-17 NOTE — BHH Counselor (Signed)
FOLLOW-UP:   CSW phoned the [facility] ([address] [place] [address]). They confirmed that Pt is eligible for admission to the House today. They did inform CSW that Pt's clothes must go to the laundromat prior to admission to the house. CSW asked if the hospital is permitted to wash clothing to avoid barriers for discharge admission. Permission was granted. She was made aware that Pt would be arriving at approximately 3:30PM via hospital safe transport.    1:1 CONTACT:  CSW spoke with Pt and made her aware that her clothing items would need to be washed prior to admission to the [facility]. She was cooperative and CSW washed clothing. CSW will label brown bag accordingly.   Freddi Che, LCSW

## 2020-05-17 NOTE — BHH Group Notes (Signed)
The focus of this group is to help patients establish daily goals to achieve during treatment and discuss how the patient can incorporate goal setting into their daily lives to aide in recovery.  Pt did not attend goals group

## 2020-05-17 NOTE — Progress Notes (Signed)
  [facility] Adult Case Management Discharge Plan :  Will you be returning to the same living situation after discharge:  No. At discharge, do you have transportation home?: Yes,  SW to arrange safe transport Do you have the ability to pay for your medications: Yes,  Pt is insured.   Release of information consent forms completed and in the chart;  Patient's signature needed at discharge.  Patient to Follow up at:  Follow-up [facility]. Follow up.   Why: Please continue participation in ACTT services post discharge. Contact information: [place] [address] [phone]               Next level of care provider has access to [place] and Suicide Prevention discussed: Yes,  completed with Pt and Pt's father   Have you used any form of tobacco in the last 30 days? (Cigarettes, Smokeless Tobacco, Cigars, and/or Pipes): Yes  Has patient been referred to the Quitline?: Patient refused referral  Patient has been referred for addiction treatment: Yes  Freddi Che, LCSW 05/17/2020, 1:45 PM

## 2020-05-17 NOTE — BHH Group Notes (Signed)
Adult Psychoeducational Group Note  Date:  05/17/2020 Time:  11:14 AM  Group Topic/Focus:  Personal Development  Participation Level:  Pt participated appropriately in gtoup  Participation Quality:  Pt was able to express things she will work on in her new placement.  Cognitive:  Pt  Was aware of possible barriers  Insight: Pt had good insight into what she needs to do when she leaves today  Engagement in Group:  appropriate  Modes of Intervention:  Questioning   Additional Comments:    Dub Mikes 05/17/2020, 11:14 AM

## 2020-05-17 NOTE — BHH Suicide Risk Assessment (Signed)
[facility] Discharge Suicide Risk Assessment   Principal Problem: Schizoaffective disorder [facility]) Discharge Diagnoses: Principal Problem:   Schizoaffective disorder ([place]) Active Problems:   Polysubstance abuse ([place])   Social anxiety disorder   Total Time spent with patient: 20 minutes  Musculoskeletal: Strength & Muscle Tone: within normal limits Gait & Station: normal Patient leans: N/A  Psychiatric Specialty Exam: Review of Systems  All other systems reviewed and are negative.   Blood pressure (!) 83/55, pulse 99, temperature (!) 97.4 F (36.3 C), temperature source Oral, resp. rate 16, height 5\' 3"  (1.6 m), weight 82.1 kg, SpO2 98 %.Body mass index is 32.06 kg/m.  General Appearance: Casual  Eye Contact::  Fair  Speech:  Normal Rate409  Volume:  Normal  Mood:  Euthymic  Affect:  Congruent  Thought Process:  Coherent and Descriptions of Associations: Intact  Orientation:  Full (Time, Place, and Person)  Thought Content:  Paranoid Ideation  Suicidal Thoughts:  No  Homicidal Thoughts:  No  Memory:  Immediate;   Fair Recent;   Fair Remote;   Fair  Judgement:  Intact  Insight:  Lacking  Psychomotor Activity:  Normal  Concentration:  Fair  Recall:  [employer] of Knowledge:Fair  Language: Good  Akathisia:  Negative  Handed:  Right  AIMS (if indicated):     Assets:  Desire for Improvement Housing Resilience  Sleep:  Number of Hours: 6.75  Cognition: WNL  ADL's:  Intact   Mental Status Per Nursing Assessment::   On Admission:  NA  Demographic Factors:  Adolescent or young adult, Caucasian, Low socioeconomic status, Living alone and Unemployed  Loss Factors: NA  Historical Factors: Impulsivity  Risk Reduction Factors:   NA  Continued Clinical Symptoms:  Alcohol/Substance Abuse/Dependencies Schizophrenia:   Less than 56 years old Paranoid or undifferentiated type  Cognitive Features That Contribute To Risk:  Thought constriction (tunnel vision)     Suicide Risk:  Minimal: No identifiable suicidal ideation.  Patients presenting with no risk factors but with morbid ruminations; may be classified as minimal risk based on the severity of the depressive symptoms   Follow-up [place]. Follow up.   Why: Please continue participation in ACTT services post discharge. Contact information: [place] [address] [phone]               Plan Of Care/Follow-up recommendations:  Activity:  ad lib  Sharma Covert, MD 05/17/2020, 7:29 AM

## 2020-05-17 NOTE — BHH Group Notes (Signed)
[place] LCSW Group Therapy  05/17/2020 2:34 PM  Type of Therapy:  Cognitive Distortions  Participation Level:  Did Not Attend  Participation Quality:  Did not attend   Affect:  Did not attend   Cognitive:  Did not attend   Insight:  Did not attend   Engagement in Therapy:  Did not attend   Modes of Intervention:  Did not attend   Summary of Progress/Problems: Pt did not attend the group   Darleen Crocker 05/17/2020, 2:34 PM

## 2020-05-17 NOTE — Discharge Summary (Signed)
Physician Discharge Summary Note  Patient:  Julia [name] is an 19 y.o., female MRN:  [id number] DOB:  [birth year] Patient phone:  [phone] (home)  Patient address:   [place] [address],  Total Time spent with patient: 15 minutes  Date of Admission:  05/08/2020 Date of Discharge: 05/17/20  Reason for Admission:  Acute psychosis  Principal Problem: Schizoaffective disorder [facility]) Discharge Diagnoses: Principal Problem:   Schizoaffective disorder ([place]) Active Problems:   Polysubstance abuse ([place])   Social anxiety disorder   Past Psychiatric History: Patient has had multiple psychiatric hospitalizations within our system. Within the last 12 months she has been admitted 5-6 times previously. She has been admitted at [facility] as well as the behavioral health hospital. She has a history of methamphetamine dependence. She is a diagnosis of schizophrenia with possible onset secondary to her methamphetamine dependence. She is also been previously diagnosed with schizoaffective disorder. She has remote history reportedly of attention deficit disorder. Her substance abuse issues began approximately at age 64 or 31.  Past Medical History:  Past Medical History:  Diagnosis Date  . Burning with urination 05/03/2015  . Contraceptive management 05/03/2015  . Depression   . Dysmenorrhea 12/30/2013  . Heroin addiction ([place])   . Menorrhagia 12/30/2013  . Menstrual extraction 12/30/2013  . Migraines   . Schizophrenia ([place])   . Social anxiety disorder 09/25/2016  . Suicidal ideations   . Vaginal odor 05/03/2015    Past Surgical History:  Procedure Laterality Date  . NO PAST SURGERIES     Family History:  Family History  Problem Relation Age of Onset  . Depression Mother   . Hypertension Father   . Hyperlipidemia Father   . Cancer Paternal Grandmother        breast, uterine  . Cirrhosis Paternal Grandfather        due to alcohol   Family  Psychiatric  History: Reportedly depression in several members of her family as well as schizophrenia and a reported aunt. Social History:  Social History   Substance and Sexual Activity  Alcohol Use Yes     Social History   Substance and Sexual Activity  Drug Use Yes  . Types: Marijuana, Oxycodone, Cocaine    Social History   Socioeconomic History  . Marital status: Single    Spouse name: Not on file  . Number of children: Not on file  . Years of education: Not on file  . Highest education level: Not on file  Occupational History  . Occupation: Unemployed  Tobacco Use  . Smoking status: Current Every Day Smoker    Packs/day: 1.00    Types: Cigarettes  . Smokeless tobacco: Never Used  Vaping Use  . Vaping Use: Never used  Substance and Sexual Activity  . Alcohol use: Yes  . Drug use: Yes    Types: Marijuana, Oxycodone, Cocaine  . Sexual activity: Not on file  Other Topics Concern  . Not on file  Social History Narrative   06/23/2019:  Pt stated that she is homeless, that she is a high school graduate, and that she is unemployed and not followed by any outpatient provider.      Lives with Dad. Mom has LGD, lives in [place]. 11th grader. Dog.   Social Determinants of Health   Financial Resource Strain:   . Difficulty of Paying Living Expenses: Not on file  Food Insecurity:   . Worried About Charity fundraiser in the Last Year: Not on  file  . [place] in the Last Year: Not on file  Transportation Needs:   . Lack of Transportation (Medical): Not on file  . Lack of Transportation (Non-Medical): Not on file  Physical Activity:   . Days of Exercise per Week: Not on file  . Minutes of Exercise per Session: Not on file  Stress:   . Feeling of Stress : Not on file  Social Connections:   . Frequency of Communication with Friends and Family: Not on file  . Frequency of Social Gatherings with Friends and Family: Not on file  . Attends Religious Services: Not on file   . Active Member of Clubs or Organizations: Not on file  . Attends Archivist Meetings: Not on file  . Marital Status: Not on file    Hospital Course:  From admission H&P: [name] familiar to me from multiple hospitalizations at the [facility] behavioral health system both at the behavioral health hospital as well as [facility] who presented to the behavioral health hospital as a walk-in patient. She has a past psychiatric history significant for schizophrenia/schizoaffective disorder as well as polysubstance use disorders. Her primary substances of choice include methamphetamines as well as cocaine. During her previous hospitalization she has been on multiple medications, referred to group homes, referred to shelters, and had ACTT services of which she always seems to be noncompliant with follow-up. She was seen and was clearly altered. She was paranoid and agitated. When she arrived on the unit she began to yell. This is basically her baseline. She is very paranoid at times. She admitted that she had used cocaine as well as some other unspecified substance. She stated that she remains homeless. She stated that when she had attempted to go to live with her boyfriend it "did not work out". She is clearly responding to internal stimuli. She was admitted to the hospital for evaluation and stabilization.  Ms. Frese was admitted for acute psychosis. She has history of multiple hospitalizations as noted above. She remained on the [facility] unit for nine days. She was restarted on Depakote, Zyprexa, and Prozac, PRN Vistaril, PRN trazodone. Wellbutrin and Neurontin were started. She participated in group therapy on the unit. She responded well to treatment with no adverse effects reported. She has shown calmer mood and affect with improved sleep and interaction. She denies any SI/HI/AVH and contracts for safety. She is discharging on the medications listed  below. She agrees to follow up with [facility] (see below). Patient is provided with prescriptions for medications upon discharge. She is discharging to [facility] via [facility].  Physical Findings: AIMS: Facial and Oral Movements Muscles of Facial Expression: None, normal Lips and Perioral Area: None, normal Jaw: None, normal Tongue: None, normal,Extremity Movements Upper (arms, wrists, hands, fingers): None, normal Lower (legs, knees, ankles, toes): None, normal, Trunk Movements Neck, shoulders, hips: None, normal, Overall Severity Severity of abnormal movements (highest score from questions above): None, normal Incapacitation due to abnormal movements: None, normal Patient's awareness of abnormal movements (rate only patient's report): No Awareness, Dental Status Current problems with teeth and/or dentures?: No Does patient usually wear dentures?: No  CIWA:  CIWA-Ar Total: 4 COWS:  COWS Total Score: 5  Musculoskeletal: Strength & Muscle Tone: within normal limits Gait & Station: normal Patient leans: N/A  Psychiatric Specialty Exam: Physical Exam Vitals and nursing note reviewed.  Constitutional:      Appearance: She is well-developed.  Cardiovascular:  Rate and Rhythm: Normal rate.  Pulmonary:     Effort: Pulmonary effort is normal.  Neurological:     Mental Status: She is alert and oriented to person, place, and time.     Review of Systems  Constitutional: Negative.   Respiratory: Negative for cough and shortness of breath.   Psychiatric/Behavioral: Negative for agitation, behavioral problems, confusion, decreased concentration, dysphoric mood, hallucinations, self-injury, sleep disturbance and suicidal ideas. The patient is not nervous/anxious and is not hyperactive.     Blood pressure (!) 83/55, pulse 99, temperature (!) 97.4 F (36.3 C), temperature source Oral, resp. rate 16, height 5\' 3"  (1.6 m), weight 82.1 kg, SpO2 98 %.Body mass index is 32.06  kg/m.  See MD's discharge SRA    Have you used any form of tobacco in the last 30 days? (Cigarettes, Smokeless Tobacco, Cigars, and/or Pipes): Yes  Has this patient used any form of tobacco in the last 30 days? (Cigarettes, Smokeless Tobacco, Cigars, and/or Pipes) , Yes, A prescription for an FDA-approved tobacco cessation medication was offered at discharge and the patient refused  Blood Alcohol level:  Lab Results  Component Value Date   [facility] <10 03/29/2020   ETH <10 70/35/0093    Metabolic Disorder Labs:  Lab Results  Component Value Date   HGBA1C 5.4 05/09/2020   MPG 108 05/09/2020   MPG 111.15 03/17/2020   Lab Results  Component Value Date   PROLACTIN 142.0 (H) 05/10/2020   PROLACTIN 113.0 (H) 11/11/2019   Lab Results  Component Value Date   CHOL 138 10/16/2019   TRIG 60 10/16/2019   HDL 36 (L) 10/16/2019   CHOLHDL 3.8 10/16/2019   VLDL 12 10/16/2019   LDLCALC 90 10/16/2019   [place] 96 05/17/2019    See Psychiatric Specialty Exam and Suicide Risk Assessment completed by Attending Physician prior to discharge.  Discharge destination:  Other:  [place]  Is patient on multiple antipsychotic therapies at discharge:  No   Has Patient had three or more failed trials of antipsychotic monotherapy by history:  No  Recommended Plan for Multiple Antipsychotic Therapies: NA  Discharge Instructions    Diet - low sodium heart healthy   Complete by: As directed    Increase activity slowly   Complete by: As directed    Increase activity slowly   Complete by: As directed      Allergies as of 05/17/2020      Reactions   Abilify [aripiprazole]    Zoloft [sertraline Hcl]       Medication List    STOP taking these medications   risperiDONE 3 MG tablet Commonly known as: RISPERDAL     TAKE these medications     Indication  benztropine 2 MG tablet Commonly known as: COGENTIN Take 1 tablet (2 mg total) by mouth 2 (two) times daily as needed for tremors  (EPS).  Indication: Extrapyramidal Reaction caused by Medications   buPROPion 150 MG 24 hr tablet Commonly known as: WELLBUTRIN XL Take 1 tablet (150 mg total) by mouth daily.  Indication: Major Depressive Disorder   divalproex 250 MG DR tablet Commonly known as: DEPAKOTE Take 3 tablets (750 mg total) by mouth every 12 (twelve) hours. What changed:   medication strength  how much to take  Indication: Schizophrenia   FLUoxetine 40 MG capsule Commonly known as: PROZAC Take 1 capsule (40 mg total) by mouth daily.  Indication: Depression   gabapentin 400 MG capsule Commonly known as: NEURONTIN Take 1 capsule (400  mg total) by mouth 3 (three) times daily.  Indication: Restless Leg Syndrome   hydrOXYzine 50 MG tablet Commonly known as: ATARAX/VISTARIL Take 1 tablet (50 mg total) by mouth 3 (three) times daily as needed for anxiety.  Indication: Feeling Anxious   olanzapine zydis 15 MG disintegrating tablet Commonly known as: ZYPREXA Take 2 tablets (30 mg total) by mouth at bedtime. What changed:   how much to take  additional instructions  Indication: Schizophrenia, Mood control   pantoprazole 40 MG tablet Commonly known as: PROTONIX Take 1 tablet (40 mg total) by mouth at bedtime. What changed: additional instructions  Indication: Gastroesophageal Reflux Disease   traZODone 100 MG tablet Commonly known as: DESYREL Take 1 tablet (100 mg total) by mouth at bedtime as needed for sleep.  Indication: [place]. Follow up.   Why: Please continue participation in ACTT services post discharge. Contact information: [place] [address] [phone]               Follow-up recommendations: Activity as tolerated. Diet as recommended by primary care physician. Keep all scheduled follow-up appointments as recommended.   Comments:   Patient is instructed to  take all prescribed medications as recommended. Report any side effects or adverse reactions to your outpatient psychiatrist. Patient is instructed to abstain from alcohol and illegal drugs while on prescription medications. In the event of worsening symptoms, patient is instructed to call the crisis hotline, 911, or go to the nearest emergency department for evaluation and treatment.  Signed: Connye Burkitt, NP 05/17/2020, 5:58 PM

## 2020-05-17 NOTE — Progress Notes (Signed)
Recreation Therapy Notes  Date:  11.3.21 Time: 0930 Location: [address] Group Room  Group Topic: Stress Management  Goal Area(s) Addresses:  Patient will identify positive stress management techniques. Patient will identify benefits of using stress management post d/c.  Intervention: Stress Management  Activity:  Meditation.  LRT played a meditation that focused on letting go of the past and not letting it color your future.  Patients were to listen and follow along as meditation was played in order to engage.    Education:  Stress Management, Discharge Planning.   Education Outcome: Acknowledges Education  Clinical Observations/Feedback: Pt did not attend group activity.    [name], LRT/CTRS         [name] A 05/17/2020 12:06 PM

## 2020-05-17 NOTE — Progress Notes (Signed)
Patient ID: Julia [name], female   DOB: Oct 21, 2000, 19 y.o.   MRN: [id number] Patient discharged to home/self care on her own accord.  Patient acknowledged understanding of all discharge instructions and receipt of personal belongings.

## 2020-05-20 ENCOUNTER — Other Ambulatory Visit: Payer: Self-pay

## 2020-05-20 ENCOUNTER — Emergency Department (HOSPITAL_COMMUNITY)
Admission: EM | Admit: 2020-05-20 | Discharge: 2020-05-21 | Disposition: A | Discharge status: Home / Self Care | Payer: Medicaid Other | Attending: Emergency Medicine | Admitting: Emergency Medicine

## 2020-05-20 ENCOUNTER — Encounter (HOSPITAL_COMMUNITY): Payer: Self-pay | Admitting: Emergency Medicine

## 2020-05-20 DIAGNOSIS — Z20822 Contact with and (suspected) exposure to covid-19: Secondary | ICD-10-CM | POA: Diagnosis not present

## 2020-05-20 DIAGNOSIS — R440 Auditory hallucinations: Secondary | ICD-10-CM | POA: Insufficient documentation

## 2020-05-20 DIAGNOSIS — R443 Hallucinations, unspecified: Secondary | ICD-10-CM

## 2020-05-20 DIAGNOSIS — Z79899 Other long term (current) drug therapy: Secondary | ICD-10-CM | POA: Insufficient documentation

## 2020-05-20 DIAGNOSIS — F17210 Nicotine dependence, cigarettes, uncomplicated: Secondary | ICD-10-CM | POA: Diagnosis not present

## 2020-05-20 LAB — CBC
HCT: 39.4 % (ref 36.0–46.0)
Hemoglobin: 12.4 g/dL (ref 12.0–15.0)
MCH: 29.3 pg (ref 26.0–34.0)
MCHC: 31.5 g/dL (ref 30.0–36.0)
MCV: 93.1 fL (ref 80.0–100.0)
Platelets: 227 10*3/uL (ref 150–400)
RBC: 4.23 MIL/uL (ref 3.87–5.11)
RDW: 13.8 % (ref 11.5–15.5)
WBC: 9 10*3/uL (ref 4.0–10.5)
nRBC: 0 % (ref 0.0–0.2)

## 2020-05-20 LAB — COMPREHENSIVE METABOLIC PANEL
ALT: 13 U/L (ref 0–44)
AST: 16 U/L (ref 15–41)
Albumin: 3.6 g/dL (ref 3.5–5.0)
Alkaline Phosphatase: 79 U/L (ref 38–126)
Anion gap: 11 (ref 5–15)
BUN: 7 mg/dL (ref 6–20)
CO2: 22 mmol/L (ref 22–32)
Calcium: 8.6 mg/dL — ABNORMAL LOW (ref 8.9–10.3)
Chloride: 105 mmol/L (ref 98–111)
Creatinine, Ser: 0.7 mg/dL (ref 0.44–1.00)
GFR, Estimated: >60 mL/min (ref >60)
Glucose, Bld: 102 mg/dL — ABNORMAL HIGH (ref 70–99)
Potassium: 3.7 mmol/L (ref 3.5–5.1)
Sodium: 138 mmol/L (ref 135–145)
Total Bilirubin: 0.5 mg/dL (ref 0.3–1.2)
Total Protein: 6.7 g/dL (ref 6.5–8.1)

## 2020-05-20 LAB — I-STAT BETA HCG BLOOD, ED (MC, WL, AP ONLY): I-stat hCG, quantitative: <5 m[IU]/mL (ref <5)

## 2020-05-20 LAB — ETHANOL: Alcohol, Ethyl (B): <10 mg/dL (ref <10)

## 2020-05-20 MED ORDER — NICOTINE 21 MG/24HR TD PT24
21.0000 mg | MEDICATED_PATCH | Freq: Once | TRANSDERMAL | Status: DC
  Administered 2020-05-20: 21 mg via TRANSDERMAL
  Filled 2020-05-20: qty 1

## 2020-05-20 NOTE — ED Triage Notes (Signed)
Patient states she has been having hallucinations of celebrities and the antichrist. Patient denies SI/HI. Patient states that she is having AVH and tactile hallucinations.

## 2020-05-20 NOTE — ED Provider Notes (Signed)
[facility] DEPT Provider Note   CSN: [id number] Arrival date & time: 05/20/20  2234     History Chief Complaint  Patient presents with  . Hallucinations    Julia [name] is a 19 y.o. female with a history of cocaine use disorder, heroin use disorder, paranoid schizophrenia, social anxiety disorder who presents to the emergency department accompanied by police with a chief complaint of hallucinations.  The patient reports that she has been hearing voices of Irving Shows, the baby, and to the antichrist for some time.  She states that they do not speak to her, but continue to play songs over and over in her head.  She is tired of hearing the songs.  She denies any kill commands.  She denies SI or visual hallucinations.  She states that she needs to be part of the federal witness protection program.  Although she denies HI, the patient gave me permission to speak with her roommate, Angelique, at [facility] where the patient has been living.  Angelique reports that earlier today she and some of the other women that live in the home became concerned when the patient began acting erratically and saying unusual things.  She states that the patient was complaining of being involved in a sex trafficking ring.  She said that secret service people were talking to her through the trees.  She was repeatedly telling the roommates that she hated the mall and she had a hit out on them.  The roommate is also concerned that she may be using drugs again, but she is unsure.  She notes that she left the home with 2 different men earlier in the day.  She reports that the patient was in her usual state of health until she began making unusual comments earlier today.   The roommates attempted to call the patient's ACT team and were advised that they could not receive any information about her health, but advised them to have her taken to the ER for evaluation.  Patient states that  she has been compliant with her home occasion.  She endorses tobacco use.  Denies alcohol or illicit or recreational substance use.  She is endorsing some swollen lymph nodes on the left side of her neck.  She denies sore throat, otalgia, fever, chills, cough, shortness of breath, abdominal pain, nausea, vomiting, diarrhea, neck or facial swelling.  No treatment prior to arrival.  The history is provided by the patient and medical records. No language interpreter was used.       Past Medical History:  Diagnosis Date  . Burning with urination 05/03/2015  . Contraceptive management 05/03/2015  . Depression   . Dysmenorrhea 12/30/2013  . Heroin addiction ([place])   . Menorrhagia 12/30/2013  . Menstrual extraction 12/30/2013  . Migraines   . Schizophrenia ([place])   . Social anxiety disorder 09/25/2016  . Suicidal ideations   . Vaginal odor 05/03/2015    Patient Active Problem List   Diagnosis Date Noted  . Schizoaffective disorder ([place]) 05/08/2020  . Schizophrenia, paranoid ([place])   . 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 Social anxiety disorder 09/25/2016  . Polysubstance abuse ([place])   . Dysmenorrhea 12/30/2013    Past Surgical History:  Procedure Laterality Date  . NO PAST SURGERIES       OB History    Gravida  0   Para      Term      Preterm  AB      Living        SAB      TAB      Ectopic      Multiple      Live Births              Family History  Problem Relation Age of Onset  . Depression Mother   . Hypertension Father   . Hyperlipidemia Father   . Cancer Paternal Grandmother        breast, uterine  . Cirrhosis Paternal Grandfather        due to alcohol    Social History   Tobacco Use  . Smoking status: Current Every Day Smoker    Packs/day: 1.00    Types: Cigarettes  . Smokeless tobacco: Never Used  Vaping Use  . Vaping Use: Never used  Substance Use Topics  . Alcohol use: Not  Currently  . Drug use: Not Currently    Types: Marijuana, Oxycodone, Cocaine    Home Medications Prior to Admission medications   Medication Sig Start Date End Date Taking? Authorizing Provider  benztropine (COGENTIN) 2 MG tablet Take 1 tablet (2 mg total) by mouth 2 (two) times daily as needed for tremors (EPS). 05/17/20   Connye Burkitt, NP  buPROPion (WELLBUTRIN XL) 150 MG 24 hr tablet Take 1 tablet (150 mg total) by mouth daily. 05/17/20   Connye Burkitt, NP  divalproex (DEPAKOTE) 250 MG DR tablet Take 3 tablets (750 mg total) by mouth every 12 (twelve) hours. 05/17/20   Connye Burkitt, NP  FLUoxetine (PROZAC) 40 MG capsule Take 1 capsule (40 mg total) by mouth daily. 05/17/20   Connye Burkitt, NP  gabapentin (NEURONTIN) 400 MG capsule Take 1 capsule (400 mg total) by mouth 3 (three) times daily. 05/17/20   Connye Burkitt, NP  hydrOXYzine (ATARAX/VISTARIL) 50 MG tablet Take 1 tablet (50 mg total) by mouth 3 (three) times daily as needed for anxiety. 05/17/20   Connye Burkitt, NP  olanzapine zydis (ZYPREXA) 15 MG disintegrating tablet Take 2 tablets (30 mg total) by mouth at bedtime. 05/17/20   Connye Burkitt, NP  pantoprazole (PROTONIX) 40 MG tablet Take 1 tablet (40 mg total) by mouth at bedtime. 05/17/20   Connye Burkitt, NP  traZODone (DESYREL) 100 MG tablet Take 1 tablet (100 mg total) by mouth at bedtime as needed for sleep. 05/17/20   Connye Burkitt, NP    Allergies    Abilify [aripiprazole] and Zoloft [sertraline hcl]  Review of Systems   Review of Systems  Constitutional: Negative for activity change, chills and fever.  HENT: Negative for sore throat.   Respiratory: Negative for shortness of breath.   Cardiovascular: Negative for chest pain and palpitations.  Gastrointestinal: Negative for abdominal pain, diarrhea, nausea and vomiting.  Genitourinary: Negative for dysuria.  Musculoskeletal: Negative for back pain, neck pain and neck stiffness.  Skin: Negative for rash and wound.    Allergic/Immunologic: Negative for immunocompromised state.  Neurological: Negative for seizures, syncope, weakness, numbness and headaches.  Hematological: Positive for adenopathy.  Psychiatric/Behavioral: Positive for behavioral problems and hallucinations. Negative for agitation, confusion and suicidal ideas. The patient is not nervous/anxious.     Physical Exam Updated Vital Signs BP 98/65 (BP Location: Left Arm)   Pulse 89   Temp 98.7 F (37.1 C)   Resp 16   Ht 5\' 3"  (1.6 m)   Wt 81.6 kg   SpO2 96%  BMI 31.89 kg/m   Physical Exam Vitals and nursing note reviewed.  Constitutional:      General: She is not in acute distress. HENT:     Head: Normocephalic.     Right Ear: Tympanic membrane, ear canal and external ear normal.     Left Ear: Tympanic membrane, ear canal and external ear normal.     Nose: Nose normal.     Mouth/Throat:     Comments: No tonsillar edema.  Posterior oropharynx is unremarkable without exudates, erythema, or edema.  Uvula is midline. Eyes:     Conjunctiva/sclera: Conjunctivae normal.  Neck:     Comments: Single left anterior cervical lymph node is soft and rubbery.  Trachea is midline.  No meningismus. Cardiovascular:     Rate and Rhythm: Normal rate and regular rhythm.     Heart sounds: No murmur heard.  No friction rub. No gallop.   Pulmonary:     Effort: Pulmonary effort is normal. No respiratory distress.     Breath sounds: No stridor. No wheezing, rhonchi or rales.  Chest:     Chest wall: No tenderness.  Abdominal:     General: There is no distension.     Palpations: Abdomen is soft. There is no mass.     Tenderness: There is no abdominal tenderness. There is no right CVA tenderness, left CVA tenderness, guarding or rebound.     Hernia: No hernia is present.     Comments: Abdomen is soft, nontender.  Musculoskeletal:     Cervical back: Neck supple.     Right lower leg: No edema.     Left lower leg: No edema.  Skin:    General:  Skin is warm.     Findings: No rash.  Neurological:     Mental Status: She is alert.  Psychiatric:        Attention and Perception: She perceives auditory hallucinations. She does not perceive visual hallucinations.        Mood and Affect: Affect is flat.        Speech: Speech is delayed.        Behavior: Behavior is slowed.        Thought Content: Thought content does not include homicidal or suicidal ideation. Thought content does not include homicidal or suicidal plan.     ED Results / Procedures / Treatments   Labs (all labs ordered are listed, but only abnormal results are displayed) Labs Reviewed  COMPREHENSIVE METABOLIC PANEL - Abnormal; Notable for the following components:      Result Value   Glucose, Bld 102 (*)    Calcium 8.6 (*)    All other components within normal limits  SALICYLATE LEVEL - Abnormal; Notable for the following components:   Salicylate Lvl <9.0 (*)    All other components within normal limits  ACETAMINOPHEN LEVEL - Abnormal; Notable for the following components:   Acetaminophen (Tylenol), Serum <10 (*)    All other components within normal limits  RESPIRATORY PANEL BY RT PCR (FLU A&B, COVID)  ETHANOL  CBC  RAPID URINE DRUG SCREEN, HOSP PERFORMED  I-STAT BETA HCG BLOOD, ED ([facility], [facility], [facility] ONLY)    EKG None  Radiology No results found.  Procedures Procedures (including critical care time)  Medications Ordered in ED Medications  nicotine (NICODERM CQ - dosed in mg/24 hours) patch 21 mg (21 mg Transdermal Patch Applied 05/20/20 2334)  alum & mag hydroxide-simeth (MAALOX/MYLANTA) 200-200-20 MG/5ML suspension 30 mL (30 mLs Oral Refused 05/21/20  0207)  ibuprofen (ADVIL) tablet 600 mg (600 mg Oral Given 05/21/20 0120)    ED Course  I have reviewed the triage vital signs and the nursing notes.  Pertinent labs & imaging results that were available during my care of the patient were reviewed by me and considered in my medical decision making (see chart  for details).    MDM Rules/Calculators/A&P                          19 year old female with a history of cocaine use disorder, heroin use disorder, paranoid schizophrenia, social anxiety disorder brought to the ER by police with hallucinations.  The patient is a voluntary at this time.  Vital signs are reassuring.  Labs have been reviewed and independently interpreted by me.  No metabolic derangements.  CBC is unremarkable.  Ethanol level is not elevated.  Tylenol and salicylate levels are normal.  COVID-19 test is negative.  UDS is pending.  However, if UDS is positive it would not change the management of this patient.  She is medically cleared at this time.  Although patient is voluntary, if she attempted to leave, given the collateral information received from roommates, she would most likely need to be IVC need.  However, she has been cooperative and agreeable with plan of care since arrival in the ER.  TTS consult is pending.  Pt medically cleared at this time. Psych hold orders placed.  Home med rec order has been placed and is pending.  TTS consulted and recommended transfer to [facility]. Dr. Dwyane Dee is the accepting clinician.  EMTALA completed by Dr. Rolland Porter, attending physician.  Patient was hemodynamically stable and in no acute distress at time of transfer.  Final Clinical Impression(s) / ED Diagnoses Final diagnoses:  Hallucinations    Rx / DC Orders ED Discharge Orders    None       Zarahi Fuerst A, PA-C 05/21/20 0981    Rolland Porter, MD 05/21/20 [phone]

## 2020-05-21 ENCOUNTER — Ambulatory Visit (HOSPITAL_COMMUNITY)
Admission: EM | Admit: 2020-05-21 | Discharge: 2020-05-22 | Payer: Medicaid Other | Attending: Behavioral Health | Admitting: Behavioral Health

## 2020-05-21 ENCOUNTER — Other Ambulatory Visit: Payer: Self-pay

## 2020-05-21 ENCOUNTER — Encounter (HOSPITAL_COMMUNITY): Payer: Self-pay | Admitting: Emergency Medicine

## 2020-05-21 DIAGNOSIS — F1910 Other psychoactive substance abuse, uncomplicated: Secondary | ICD-10-CM | POA: Insufficient documentation

## 2020-05-21 DIAGNOSIS — Z560 Unemployment, unspecified: Secondary | ICD-10-CM | POA: Insufficient documentation

## 2020-05-21 DIAGNOSIS — F14259 Cocaine dependence with cocaine-induced psychotic disorder, unspecified: Secondary | ICD-10-CM

## 2020-05-21 DIAGNOSIS — F14251 Cocaine dependence with cocaine-induced psychotic disorder with hallucinations: Secondary | ICD-10-CM | POA: Insufficient documentation

## 2020-05-21 DIAGNOSIS — F251 Schizoaffective disorder, depressive type: Secondary | ICD-10-CM

## 2020-05-21 DIAGNOSIS — Z5901 Sheltered homelessness: Secondary | ICD-10-CM | POA: Insufficient documentation

## 2020-05-21 DIAGNOSIS — F17210 Nicotine dependence, cigarettes, uncomplicated: Secondary | ICD-10-CM | POA: Insufficient documentation

## 2020-05-21 DIAGNOSIS — F200 Paranoid schizophrenia: Secondary | ICD-10-CM | POA: Insufficient documentation

## 2020-05-21 LAB — POCT URINE DRUG SCREEN - MANUAL ENTRY (I-SCREEN)
POC Amphetamine UR: NOT DETECTED
POC Buprenorphine (BUP): NOT DETECTED
POC Cocaine UR: NOT DETECTED
POC Marijuana UR: POSITIVE — AB
POC Methadone UR: NOT DETECTED
POC Methamphetamine UR: NOT DETECTED
POC Morphine: NOT DETECTED
POC Oxazepam (BZO): POSITIVE — AB
POC Oxycodone UR: NOT DETECTED
POC Secobarbital (BAR): NOT DETECTED

## 2020-05-21 LAB — SALICYLATE LEVEL: Salicylate Lvl: <7 mg/dL — ABNORMAL LOW (ref 7.0–30.0)

## 2020-05-21 LAB — RESPIRATORY PANEL BY RT PCR (FLU A&B, COVID)
Influenza A by PCR: NEGATIVE
Influenza B by PCR: NEGATIVE
SARS Coronavirus 2 by RT PCR: NEGATIVE

## 2020-05-21 LAB — ACETAMINOPHEN LEVEL: Acetaminophen (Tylenol), Serum: <10 ug/mL — ABNORMAL LOW (ref 10–30)

## 2020-05-21 MED ORDER — HYDROXYZINE HCL 25 MG PO TABS: 50.0000 mg | ORAL_TABLET | Freq: Three times a day (TID) | ORAL | Status: DC | PRN

## 2020-05-21 MED ORDER — MAGNESIUM HYDROXIDE 400 MG/5ML PO SUSP: 30.0000 mL | Freq: Every day | ORAL | Status: DC | PRN

## 2020-05-21 MED ORDER — FLUOXETINE HCL 20 MG PO CAPS
40.0000 mg | ORAL_CAPSULE | Freq: Every day | ORAL | Status: DC
  Administered 2020-05-21 – 2020-05-22 (×2): 40 mg via ORAL
  Filled 2020-05-21 (×2): qty 2

## 2020-05-21 MED ORDER — ACETAMINOPHEN 325 MG PO TABS: 650.0000 mg | ORAL_TABLET | Freq: Four times a day (QID) | ORAL | Status: DC | PRN

## 2020-05-21 MED ORDER — TRAZODONE HCL 100 MG PO TABS: 100.0000 mg | ORAL_TABLET | Freq: Every evening | ORAL | Status: DC | PRN

## 2020-05-21 MED ORDER — BENZTROPINE MESYLATE 1 MG PO TABS: 2.0000 mg | ORAL_TABLET | Freq: Two times a day (BID) | ORAL | Status: DC | PRN

## 2020-05-21 MED ORDER — PANTOPRAZOLE SODIUM 40 MG PO TBEC
40.0000 mg | DELAYED_RELEASE_TABLET | Freq: Every day | ORAL | Status: DC
  Administered 2020-05-21: 40 mg via ORAL
  Filled 2020-05-21: qty 1

## 2020-05-21 MED ORDER — OLANZAPINE 10 MG PO TBDP
30.0000 mg | ORAL_TABLET | Freq: Every day | ORAL | Status: DC
  Administered 2020-05-21: 30 mg via ORAL
  Filled 2020-05-21: qty 3

## 2020-05-21 MED ORDER — BUPROPION HCL ER (XL) 150 MG PO TB24
150.0000 mg | ORAL_TABLET | Freq: Every day | ORAL | Status: DC
  Administered 2020-05-21 – 2020-05-22 (×2): 150 mg via ORAL
  Filled 2020-05-21 (×2): qty 1

## 2020-05-21 MED ORDER — DIVALPROEX SODIUM 500 MG PO DR TAB
750.0000 mg | DELAYED_RELEASE_TABLET | Freq: Two times a day (BID) | ORAL | Status: DC
  Administered 2020-05-21 – 2020-05-22 (×3): 750 mg via ORAL
  Filled 2020-05-21 (×3): qty 1

## 2020-05-21 MED ORDER — GABAPENTIN 400 MG PO CAPS
400.0000 mg | ORAL_CAPSULE | Freq: Three times a day (TID) | ORAL | Status: DC
  Administered 2020-05-21 – 2020-05-22 (×3): 400 mg via ORAL
  Filled 2020-05-21 (×3): qty 1

## 2020-05-21 MED ORDER — ALUM & MAG HYDROXIDE-SIMETH 200-200-20 MG/5ML PO SUSP: 30.0000 mL | ORAL | Status: DC | PRN

## 2020-05-21 MED ORDER — IBUPROFEN 200 MG PO TABS
600.0000 mg | ORAL_TABLET | Freq: Three times a day (TID) | ORAL | Status: DC | PRN
  Administered 2020-05-21: 600 mg via ORAL
  Filled 2020-05-21: qty 3

## 2020-05-21 MED ORDER — ALUM & MAG HYDROXIDE-SIMETH 200-200-20 MG/5ML PO SUSP
30.0000 mL | Freq: Four times a day (QID) | ORAL | Status: DC | PRN
  Filled 2020-05-21: qty 30

## 2020-05-21 NOTE — ED Triage Notes (Signed)
Pt presents with hallucinations of the antichrist and celebrities.  Denies SI, HI.

## 2020-05-21 NOTE — ED Notes (Signed)
Pt sleeping@this time. Breathing even and unlabored. Will continue to monitor for safety 

## 2020-05-21 NOTE — ED Notes (Signed)
Pt sleeping at present, no distress noted, calm at present.  Monitoring for safety.

## 2020-05-21 NOTE — ED Notes (Signed)
Patient refused vitals; Patient given breakfast; muffin, juice

## 2020-05-21 NOTE — ED Provider Notes (Signed)
Behavioral Health Admission H&P [facility] & OBS)  Date: 05/21/20 Patient Name: Julia [name] MRN: [id number] Chief Complaint:  Chief Complaint  Patient presents with  . Hallucinations      Diagnoses: paranoid schizophrenia,   HPI: Julia [name] is a 19 y.o. female with a history of cocaine use disorder, heroin use disorder, paranoid schizophrenia, social anxiety disorder which presents to [facility] emergency department accompanied by police with a chief complaint of hallucinations. The patient was then transferred to [facility].   The patient reports that she has been hearing the voices of Irving Shows, the baby, and the antichrist for about a year.  She states that they do not speak to her but continue to play songs repeatedly in her head.  She is tired of hearing the songs.  She denies any kill commands.  She denies SI or visual hallucinations.  She states that she needs to be part of the federal witness protection program. The patient does appear to be responding to internal stimuli, auditory hallucinations. Neither is the patient presenting with any delusional thinking. The patient admits to auditory or visual hallucinations. The patient denies any suicidal, homicidal, or self-harm ideations. The patient is not presenting with any psychotic or paranoid behaviors. During an encounter with the patient, she was able to answer questions appropriately. The patient is casually dressed, alert, and oriented x4. The patient speaks in a clear tone, at moderate volume, and at an average pace. Motor behavior appears normal. Eye contact is good. The patient's mood is depressed, and her affect is congruent with mood. The thought process is coherent and relevant. The patient was cooperative throughout the assessment.   PHQ 2-9:    Office Visit from 07/11/2016 in [facility] OB-GYN  Thoughts that you would be better off dead, or of hurting yourself in some way Not at all  PHQ-9 Total Score 0        Admission (Discharged)  from OP Visit from 05/08/2020 in [place] 400B ED from 03/25/2020 in [place] DEPT Admission (Discharged) from 03/16/2020 in [place] 500B  C-SSRS RISK CATEGORY No Risk High Risk No Risk       Total Time spent with patient: 20 minutes  Musculoskeletal  Strength & Muscle Tone: within normal limits Gait & Station: normal Patient leans: N/A  Psychiatric Specialty Exam  Presentation General Appearance: Casual  Eye Contact:Good  Speech:Slow;Clear and Coherent  Speech Volume:Normal  Handedness:Right   Mood and Affect  Mood:Euthymic  Affect:Blunt   Thought Process  Thought Processes:Coherent  Descriptions of Associations:Intact  Orientation:Full (Time, Place and Person)  Thought Content:Logical  Hallucinations:No data recorded Ideas of Reference:None  Suicidal Thoughts:No data recorded Homicidal Thoughts:No data recorded  Sensorium  Memory:Immediate Fair;Recent Fair;Remote Fair  Judgment:Poor  Insight:Lacking   Executive Functions  Concentration:Fair  Attention Span:Fair  [place]   Psychomotor Activity  Psychomotor Activity:No data recorded  Assets  Assets:Communication Skills;Desire for Improvement;Resilience   Sleep  Sleep:No data recorded  Physical Exam Vitals and nursing note reviewed.  Constitutional:      Appearance: Normal appearance. She is obese.  HENT:     Nose: Nose normal.  Cardiovascular:     Rate and Rhythm: Tachycardia present.  Musculoskeletal:        General: Normal range of motion.     Cervical back: Normal range of motion and neck supple.  Skin:    General: Skin is warm.  Neurological:  Mental Status: She is alert and oriented to person, place, and time.  Psychiatric:        Attention and Perception: Attention and perception normal.        Mood and Affect: Mood and affect  normal.        Speech: Speech normal.        Behavior: Behavior normal. Behavior is cooperative.        Thought Content: Thought content normal.        Cognition and Memory: Cognition normal.        Judgment: Judgment normal.    ROS  Blood pressure 109/70, pulse (!) 103, temperature 97.8 F (36.6 C), temperature source Tympanic, resp. rate 18, height 5' 6.14" (1.68 m), weight 85.3 kg, SpO2 100 %. Body mass index is 30.21 kg/m.  Past Psychiatric History:   Is the patient at risk to self? No  Has the patient been a risk to self in the past 6 months? No .    Has the patient been a risk to self within the distant past? No   Is the patient a risk to others? No   Has the patient been a risk to others in the past 6 months? No   Has the patient been a risk to others within the distant past? No   Past Medical History:  Past Medical History:  Diagnosis Date  . Burning with urination 05/03/2015  . Contraceptive management 05/03/2015  . Depression   . Dysmenorrhea 12/30/2013  . Heroin addiction ([place])   . Menorrhagia 12/30/2013  . Menstrual extraction 12/30/2013  . Migraines   . Schizophrenia ([place])   . Social anxiety disorder 09/25/2016  . Suicidal ideations   . Vaginal odor 05/03/2015    Past Surgical History:  Procedure Laterality Date  . NO PAST SURGERIES      Family History:  Family History  Problem Relation Age of Onset  . Depression Mother   . Hypertension Father   . Hyperlipidemia Father   . Cancer Paternal Grandmother        breast, uterine  . Cirrhosis Paternal Grandfather        due to alcohol    Social History:  Social History   Socioeconomic History  . Marital status: Single    Spouse name: Not on file  . Number of children: Not on file  . Years of education: Not on file  . Highest education level: Not on file  Occupational History  . Occupation: Unemployed  Tobacco Use  . Smoking status: Current Every Day Smoker    Packs/day: 1.00    Types: Cigarettes   . Smokeless tobacco: Never Used  Vaping Use  . Vaping Use: Never used  Substance and Sexual Activity  . Alcohol use: Not Currently  . Drug use: Not Currently    Types: Marijuana, Oxycodone, Cocaine  . Sexual activity: Not on file  Other Topics Concern  . Not on file  Social History Narrative   06/23/2019:  Pt stated that she is homeless, that she is a high school graduate, and that she is unemployed and not followed by any outpatient provider.      Lives with Dad. Mom has LGD, lives in [place]. 11th grader. Dog.   Social Determinants of Health   Financial Resource Strain:   . Difficulty of Paying Living Expenses: Not on file  Food Insecurity:   . Worried About Charity fundraiser in the Last Year: Not on file  . Ran Out  of Food in the Last Year: Not on file  Transportation Needs:   . Lack of Transportation (Medical): Not on file  . Lack of Transportation (Non-Medical): Not on file  Physical Activity:   . Days of Exercise per Week: Not on file  . Minutes of Exercise per Session: Not on file  Stress:   . Feeling of Stress : Not on file  Social Connections:   . Frequency of Communication with Friends and Family: Not on file  . Frequency of Social Gatherings with Friends and Family: Not on file  . Attends Religious Services: Not on file  . Active Member of Clubs or Organizations: Not on file  . Attends Archivist Meetings: Not on file  . Marital Status: Not on file  Intimate Partner Violence:   . Fear of Current or Ex-Partner: Not on file  . Emotionally Abused: Not on file  . Physically Abused: Not on file  . Sexually Abused: Not on file    SDOH:  SDOH Screenings   Alcohol Screen: Low Risk   . Last Alcohol Screening Score (AUDIT): 0  Depression (PHQ2-9):   . PHQ-2 Score: Not on file  Financial Resource Strain:   . Difficulty of Paying Living Expenses: Not on file  Food Insecurity:   . Worried About Charity fundraiser in the Last Year: Not on file  . Ran Out  of Food in the Last Year: Not on file  Housing:   . Last Housing Risk Score: Not on file  Physical Activity:   . Days of Exercise per Week: Not on file  . Minutes of Exercise per Session: Not on file  Social Connections:   . Frequency of Communication with Friends and Family: Not on file  . Frequency of Social Gatherings with Friends and Family: Not on file  . Attends Religious Services: Not on file  . Active Member of Clubs or Organizations: Not on file  . Attends Archivist Meetings: Not on file  . Marital Status: Not on file  Stress:   . Feeling of Stress : Not on file  Tobacco Use: High Risk  . Smoking Tobacco Use: Current Every Day Smoker  . Smokeless Tobacco Use: Never Used  Transportation Needs:   . Film/video editor (Medical): Not on file  . Lack of Transportation (Non-Medical): Not on file    Last Labs:  Admission on 05/21/2020  Component Date Value Ref Range Status  . POC Amphetamine UR 05/21/2020 None Detected  None Detected Preliminary  . POC Secobarbital (BAR) 05/21/2020 None Detected  None Detected Preliminary  . POC Buprenorphine (BUP) 05/21/2020 None Detected  None Detected Preliminary  . POC Oxazepam (BZO) 05/21/2020 Positive* None Detected Preliminary  . POC Cocaine UR 05/21/2020 None Detected  None Detected Preliminary  . POC Methamphetamine UR 05/21/2020 None Detected  None Detected Preliminary  . POC Morphine 05/21/2020 None Detected  None Detected Preliminary  . POC Oxycodone UR 05/21/2020 None Detected  None Detected Preliminary  . POC Methadone UR 05/21/2020 None Detected  None Detected Preliminary  . POC Marijuana UR 05/21/2020 Positive* None Detected Preliminary  Admission on 05/20/2020, Discharged on 05/21/2020  Component Date Value Ref Range Status  . Sodium 05/20/2020 138  135 - 145 mmol/L Final  . Potassium 05/20/2020 3.7  3.5 - 5.1 mmol/L Final  . Chloride 05/20/2020 105  98 - 111 mmol/L Final  . CO2 05/20/2020 22  22 - 32 mmol/L  Final  . Glucose, Bld 05/20/2020  102* 70 - 99 mg/dL Final   Glucose reference range applies only to samples taken after fasting for at least 8 hours.  . BUN 05/20/2020 7  6 - 20 mg/dL Final  . Creatinine, Ser 05/20/2020 0.70  0.44 - 1.00 mg/dL Final  . Calcium 05/20/2020 8.6* 8.9 - 10.3 mg/dL Final  . Total Protein 05/20/2020 6.7  6.5 - 8.1 g/dL Final  . Albumin 05/20/2020 3.6  3.5 - 5.0 g/dL Final  . AST 05/20/2020 16  15 - 41 U/L Final  . ALT 05/20/2020 13  0 - 44 U/L Final  . Alkaline Phosphatase 05/20/2020 79  38 - 126 U/L Final  . Total Bilirubin 05/20/2020 0.5  0.3 - 1.2 mg/dL Final  . GFR, Estimated 05/20/2020 >60  >60 mL/min Final   Comment: (NOTE) Calculated using the CKD-EPI Creatinine Equation (2021)   . Anion gap 05/20/2020 11  5 - 15 Final   Performed at [facility], [place] [address] [place], [place] [address]  . Alcohol, Ethyl (B) 05/20/2020 <10  <10 mg/dL Final   Comment: (NOTE) Lowest detectable limit for serum alcohol is 10 mg/dL.  For medical purposes only. Performed at [facility], [place] [address], [place], [place] [address]   . WBC 05/20/2020 9.0  4.0 - 10.5 K/uL Final  . RBC 05/20/2020 4.23  3.87 - 5.11 MIL/uL Final  . Hemoglobin 05/20/2020 12.4  12.0 - 15.0 g/dL Final  . HCT 05/20/2020 39.4  36 - 46 % Final  . MCV 05/20/2020 93.1  80.0 - 100.0 fL Final  . MCH 05/20/2020 29.3  26.0 - 34.0 pg Final  . MCHC 05/20/2020 31.5  30.0 - 36.0 g/dL Final  . RDW 05/20/2020 13.8  11.5 - 15.5 % Final  . Platelets 05/20/2020 227  150 - 400 K/uL Final  . nRBC 05/20/2020 0.0  0.0 - 0.2 % Final   Performed at [facility], [place] [address], [place], [place] [address]  . I-stat hCG, quantitative 05/20/2020 <5.0  <5 mIU/mL Final  . Comment 3 05/20/2020          Final   Comment:   GEST. AGE      CONC.  (mIU/mL)   <=1 WEEK        5 - 50     2 WEEKS       50 - 500     3 WEEKS       100 - 10,000     4 WEEKS     1,000 -  30,000        FEMALE AND NON-PREGNANT FEMALE:     LESS THAN 5 mIU/mL   . SARS Coronavirus 2 by RT PCR 05/20/2020 NEGATIVE  NEGATIVE Final   Comment: (NOTE) SARS-CoV-2 target nucleic acids are NOT DETECTED.  The SARS-CoV-2 RNA is generally detectable in upper respiratoy specimens during the acute phase of infection. The lowest concentration of SARS-CoV-2 viral copies this assay can detect is 131 copies/mL. A negative result does not preclude SARS-Cov-2 infection and should not be used as the sole basis for treatment or other patient management decisions. A negative result may occur with  improper specimen collection/handling, submission of specimen other than nasopharyngeal swab, presence of viral mutation(s) within the areas targeted by this assay, and inadequate number of viral copies (<131 copies/mL). A negative result must be combined with clinical observations, patient history, and epidemiological information. The expected result is Negative.  Fact Sheet for Patients:  PinkCheek.be  Fact Sheet for Healthcare Providers:  GravelBags.it  This test is no                          t yet approved or cleared by the [place] FDA and  has been authorized for detection and/or diagnosis of SARS-CoV-2 by FDA under an Emergency Use Authorization (EUA). This EUA will remain  in effect (meaning this test can be used) for the duration of the COVID-19 declaration under Section 564(b)(1) of the Act, 21 U.S.C. section 360bbb-3(b)(1), unless the authorization is terminated or revoked sooner.    . Influenza A by PCR 05/20/2020 NEGATIVE  NEGATIVE Final  . Influenza B by PCR 05/20/2020 NEGATIVE  NEGATIVE Final   Comment: (NOTE) The Xpert Xpress SARS-CoV-2/FLU/RSV assay is intended as an aid in  the diagnosis of influenza from Nasopharyngeal swab specimens and  should not be used as a sole basis for treatment. Nasal washings and   aspirates are unacceptable for Xpert Xpress SARS-CoV-2/FLU/RSV  testing.  Fact Sheet for Patients: PinkCheek.be  Fact Sheet for Healthcare Providers: GravelBags.it  This test is not yet approved or cleared by the [place] FDA and  has been authorized for detection and/or diagnosis of SARS-CoV-2 by  FDA under an Emergency Use Authorization (EUA). This EUA will remain  in effect (meaning this test can be used) for the duration of the  Covid-19 declaration under Section 564(b)(1) of the Act, 21  U.S.C. section 360bbb-3(b)(1), unless the authorization is  terminated or revoked. Performed at [facility], [place] [address] [place], [place] [address]   . Salicylate Lvl 95/03/3266 <7.0* 7.0 - 30.0 mg/dL Final   Performed at [place] [address], [place], [place] [address]  . Acetaminophen (Tylenol), Serum 05/20/2020 <10* 10 - 30 ug/mL Final   Comment: (NOTE) Therapeutic concentrations vary significantly. A range of 10-30 ug/mL  may be an effective concentration for many patients. However, some  are best treated at concentrations outside of this range. Acetaminophen concentrations >150 ug/mL at 4 hours after ingestion  and >50 ug/mL at 12 hours after ingestion are often associated with  toxic reactions.  Performed at [facility], [place] [address], [place],  [address]   Admission on 05/08/2020, Discharged on 05/17/2020  Component Date Value Ref Range Status  . SARS Coronavirus 2 by RT PCR 05/08/2020 NEGATIVE  NEGATIVE Final   Comment: (NOTE) SARS-CoV-2 target nucleic acids are NOT DETECTED.  The SARS-CoV-2 RNA is generally detectable in upper respiratoy specimens during the acute phase of infection. The lowest concentration of SARS-CoV-2 viral copies this assay can detect is 131 copies/mL. A negative result does not preclude SARS-Cov-2 infection and  should not be used as the sole basis for treatment or other patient management decisions. A negative result may occur with  improper specimen collection/handling, submission of specimen other than nasopharyngeal swab, presence of viral mutation(s) within the areas targeted by this assay, and inadequate number of viral copies (<131 copies/mL). A negative result must be combined with clinical observations, patient history, and epidemiological information. The expected result is Negative.  Fact Sheet for Patients:  PinkCheek.be  Fact Sheet for Healthcare Providers:  GravelBags.it  This test is no                          t yet approved or cleared by the [place] FDA and  has  been authorized for detection and/or diagnosis of SARS-CoV-2 by FDA under an Emergency Use Authorization (EUA). This EUA will remain  in effect (meaning this test can be used) for the duration of the COVID-19 declaration under Section 564(b)(1) of the Act, 21 U.S.C. section 360bbb-3(b)(1), unless the authorization is terminated or revoked sooner.    . Influenza A by PCR 05/08/2020 NEGATIVE  NEGATIVE Final  . Influenza B by PCR 05/08/2020 NEGATIVE  NEGATIVE Final   Comment: (NOTE) The Xpert Xpress SARS-CoV-2/FLU/RSV assay is intended as an aid in  the diagnosis of influenza from Nasopharyngeal swab specimens and  should not be used as a sole basis for treatment. Nasal washings and  aspirates are unacceptable for Xpert Xpress SARS-CoV-2/FLU/RSV  testing.  Fact Sheet for Patients: PinkCheek.be  Fact Sheet for Healthcare Providers: GravelBags.it  This test is not yet approved or cleared by the [place] FDA and  has been authorized for detection and/or diagnosis of SARS-CoV-2 by  FDA under an Emergency Use Authorization (EUA). This EUA will remain  in effect (meaning this test can be used)  for the duration of the  Covid-19 declaration under Section 564(b)(1) of the Act, 21  U.S.C. section 360bbb-3(b)(1), unless the authorization is  terminated or revoked. Performed at [facility], [place] [address] [place], [place] [address]   . Opiates 05/09/2020 NONE DETECTED  NONE DETECTED Final  . Cocaine 05/09/2020 POSITIVE* NONE DETECTED Final  . Benzodiazepines 05/09/2020 POSITIVE* NONE DETECTED Final  . Amphetamines 05/09/2020 NONE DETECTED  NONE DETECTED Final  . Tetrahydrocannabinol 05/09/2020 POSITIVE* NONE DETECTED Final  . Barbiturates 05/09/2020 NONE DETECTED  NONE DETECTED Final   Comment: (NOTE) DRUG SCREEN FOR MEDICAL PURPOSES ONLY.  IF CONFIRMATION IS NEEDED FOR ANY PURPOSE, NOTIFY LAB WITHIN 5 DAYS.  LOWEST DETECTABLE LIMITS FOR URINE DRUG SCREEN Drug Class                     Cutoff (ng/mL) Amphetamine and metabolites    1000 Barbiturate and metabolites    200 Benzodiazepine                 798 Tricyclics and metabolites     300 Opiates and metabolites        300 Cocaine and metabolites        300 THC                            50 Performed at [facility], [place] [address], [place], [place] [address]   . WBC 05/09/2020 7.8  4.0 - 10.5 K/uL Final  . RBC 05/09/2020 4.19  3.87 - 5.11 MIL/uL Final  . Hemoglobin 05/09/2020 12.1  12.0 - 15.0 g/dL Final  . HCT 05/09/2020 37.9  36 - 46 % Final  . MCV 05/09/2020 90.5  80.0 - 100.0 fL Final  . MCH 05/09/2020 28.9  26.0 - 34.0 pg Final  . MCHC 05/09/2020 31.9  30.0 - 36.0 g/dL Final  . RDW 05/09/2020 14.0  11.5 - 15.5 % Final  . Platelets 05/09/2020 291  150 - 400 K/uL Final  . nRBC 05/09/2020 0.0  0.0 - 0.2 % Final   Performed at [facility], [place] [address] [place],  [address]  . Sodium 05/09/2020 140  135 - 145 mmol/L Final  . Potassium 05/09/2020 4.3  3.5 - 5.1 mmol/L Final  . Chloride 05/09/2020 105  98 - 111 mmol/L  Final  . CO2 05/09/2020 26  22  - 32 mmol/L Final  . Glucose, Bld 05/09/2020 101* 70 - 99 mg/dL Final   Glucose reference range applies only to samples taken after fasting for at least 8 hours.  . BUN 05/09/2020 16  6 - 20 mg/dL Final  . Creatinine, Ser 05/09/2020 0.87  0.44 - 1.00 mg/dL Final  . Calcium 05/09/2020 8.9  8.9 - 10.3 mg/dL Final  . Total Protein 05/09/2020 6.9  6.5 - 8.1 g/dL Final  . Albumin 05/09/2020 3.6  3.5 - 5.0 g/dL Final  . AST 05/09/2020 14* 15 - 41 U/L Final  . ALT 05/09/2020 20  0 - 44 U/L Final  . Alkaline Phosphatase 05/09/2020 78  38 - 126 U/L Final  . Total Bilirubin 05/09/2020 0.4  0.3 - 1.2 mg/dL Final  . GFR, Estimated 05/09/2020 >60  >60 mL/min Final   Comment: (NOTE) Calculated using the CKD-EPI Creatinine Equation (2021)   . Anion gap 05/09/2020 9  5 - 15 Final   Performed at [facility], [place] [address], [place], [place] [address]  . Hgb A1c MFr Bld 05/09/2020 5.4  4.8 - 5.6 % Final   Comment: (NOTE)         Prediabetes: 5.7 - 6.4         Diabetes: >6.4         Glycemic control for adults with diabetes: <7.0   . Mean Plasma Glucose 05/09/2020 108  mg/dL Final   Comment: (NOTE) Performed At: [facility] [place], [place] [id number] Rush Farmer MD [id number]   . Preg Test, Ur 05/09/2020 NEGATIVE  NEGATIVE Final   Comment:        THE SENSITIVITY OF THIS METHODOLOGY IS >20 mIU/mL. Performed at [facility], [place] [address], [place], [place] [address]   . TSH 05/10/2020 0.354  0.350 - 4.500 uIU/mL Final   Comment: Performed by a 3rd Generation assay with a functional sensitivity of <=0.01 uIU/mL. Performed at [facility], [place] [address] [place], [place] [address]   . Valproic Acid Lvl 05/09/2020 84  50.0 - 100.0 ug/mL Final   Performed at [facility], [place] [address] [place], [place] [address]  . Prolactin 05/10/2020 142.0* 4.8 - 23.3 ng/mL Final   Comment:  (NOTE) Performed At: [facility] [place], [place] [id number] Rush Farmer MD [id number]   . WBC 05/16/2020 7.8  4.0 - 10.5 K/uL Final  . RBC 05/16/2020 4.43  3.87 - 5.11 MIL/uL Final  . Hemoglobin 05/16/2020 12.8  12.0 - 15.0 g/dL Final  . HCT 05/16/2020 39.9  36 - 46 % Final  . MCV 05/16/2020 90.1  80.0 - 100.0 fL Final  . MCH 05/16/2020 28.9  26.0 - 34.0 pg Final  . MCHC 05/16/2020 32.1  30.0 - 36.0 g/dL Final  . RDW 05/16/2020 13.8  11.5 - 15.5 % Final  . Platelets 05/16/2020 253  150 - 400 K/uL Final  . nRBC 05/16/2020 0.0  0.0 - 0.2 % Final  . Neutrophils Relative % 05/16/2020 42  % Final  . Neutro Abs 05/16/2020 3.3  1.7 - 7.7 K/uL Final  . Lymphocytes Relative 05/16/2020 47  % Final  . Lymphs Abs 05/16/2020 3.5  0.7 - 4.0 K/uL Final  . Monocytes Relative 05/16/2020 9  % Final  . Monocytes Absolute 05/16/2020 0.7  0.1 - 1.0 K/uL Final  . Eosinophils Relative 05/16/2020 1  %  Final  . Eosinophils Absolute 05/16/2020 0.1  0.0 - 0.5 K/uL Final  . Basophils Relative 05/16/2020 0  % Final  . Basophils Absolute 05/16/2020 0.0  0.0 - 0.1 K/uL Final  . Immature Granulocytes 05/16/2020 1  % Final  . Abs Immature Granulocytes 05/16/2020 0.07  0.00 - 0.07 K/uL Final   Performed at [facility], [place] [address], [place], [place] [address]  . Sodium 05/16/2020 138  135 - 145 mmol/L Final  . Potassium 05/16/2020 3.9  3.5 - 5.1 mmol/L Final  . Chloride 05/16/2020 105  98 - 111 mmol/L Final  . CO2 05/16/2020 24  22 - 32 mmol/L Final  . Glucose, Bld 05/16/2020 92  70 - 99 mg/dL Final   Glucose reference range applies only to samples taken after fasting for at least 8 hours.  . BUN 05/16/2020 15  6 - 20 mg/dL Final  . Creatinine, Ser 05/16/2020 0.78  0.44 - 1.00 mg/dL Final  . Calcium 05/16/2020 9.3  8.9 - 10.3 mg/dL Final  . Total Protein 05/16/2020 7.1  6.5 - 8.1 g/dL Final  . Albumin 05/16/2020 3.8  3.5 - 5.0 g/dL Final  . AST 05/16/2020 22   15 - 41 U/L Final  . ALT 05/16/2020 15  0 - 44 U/L Final  . Alkaline Phosphatase 05/16/2020 82  38 - 126 U/L Final  . Total Bilirubin 05/16/2020 0.5  0.3 - 1.2 mg/dL Final  . GFR, Estimated 05/16/2020 >60  >60 mL/min Final   Comment: (NOTE) Calculated using the CKD-EPI Creatinine Equation (2021)   . Anion gap 05/16/2020 9  5 - 15 Final   Performed at [facility], [place] [address] [place], [place] [address]  . Valproic Acid Lvl 05/16/2020 93  50.0 - 100.0 ug/mL Final   Performed at [facility], [place] [address] [place], [place] [address]  Admission on 03/29/2020, Discharged on 03/30/2020  Component Date Value Ref Range Status  . Sodium 03/29/2020 135  135 - 145 mmol/L Final  . Potassium 03/29/2020 3.7  3.5 - 5.1 mmol/L Final  . Chloride 03/29/2020 101  98 - 111 mmol/L Final  . CO2 03/29/2020 24  22 - 32 mmol/L Final  . Glucose, Bld 03/29/2020 117* 70 - 99 mg/dL Final   Glucose reference range applies only to samples taken after fasting for at least 8 hours.  . BUN 03/29/2020 12  6 - 20 mg/dL Final  . Creatinine, Ser 03/29/2020 0.82  0.44 - 1.00 mg/dL Final  . Calcium 03/29/2020 9.2  8.9 - 10.3 mg/dL Final  . Total Protein 03/29/2020 7.7  6.5 - 8.1 g/dL Final  . Albumin 03/29/2020 4.0  3.5 - 5.0 g/dL Final  . AST 03/29/2020 21  15 - 41 U/L Final  . ALT 03/29/2020 16  0 - 44 U/L Final  . Alkaline Phosphatase 03/29/2020 76  38 - 126 U/L Final  . Total Bilirubin 03/29/2020 0.5  0.3 - 1.2 mg/dL Final  . GFR calc non Af Amer 03/29/2020 >60  >60 mL/min Final  . GFR calc Af Amer 03/29/2020 >60  >60 mL/min Final  . Anion gap 03/29/2020 10  5 - 15 Final   Performed at [facility], [address] [place], [place] [address]  . Alcohol, Ethyl (B) 03/29/2020 <10  <10 mg/dL Final   Comment: (NOTE) Lowest detectable limit for serum alcohol is 10 mg/dL.  For medical purposes only. Performed at [facility], [address] [place],  [place] [address]   .  Opiates 03/29/2020 NONE DETECTED  NONE DETECTED Final  . Cocaine 03/29/2020 POSITIVE* NONE DETECTED Final  . Benzodiazepines 03/29/2020 NONE DETECTED  NONE DETECTED Final  . Amphetamines 03/29/2020 NONE DETECTED  NONE DETECTED Final  . Tetrahydrocannabinol 03/29/2020 POSITIVE* NONE DETECTED Final  . Barbiturates 03/29/2020 NONE DETECTED  NONE DETECTED Final   Comment: (NOTE) DRUG SCREEN FOR MEDICAL PURPOSES ONLY.  IF CONFIRMATION IS NEEDED FOR ANY PURPOSE, NOTIFY LAB WITHIN 5 DAYS.  LOWEST DETECTABLE LIMITS FOR URINE DRUG SCREEN Drug Class                     Cutoff (ng/mL) Amphetamine and metabolites    1000 Barbiturate and metabolites    200 Benzodiazepine                 932 Tricyclics and metabolites     300 Opiates and metabolites        300 Cocaine and metabolites        300 THC                            50 Performed at [facility], [address] [place], [place] [address]   . WBC 03/29/2020 8.4  4.0 - 10.5 K/uL Final  . RBC 03/29/2020 4.81  3.87 - 5.11 MIL/uL Final  . Hemoglobin 03/29/2020 13.7  12.0 - 15.0 g/dL Final  . HCT 03/29/2020 43.0  36 - 46 % Final  . MCV 03/29/2020 89.4  80.0 - 100.0 fL Final  . MCH 03/29/2020 28.5  26.0 - 34.0 pg Final  . MCHC 03/29/2020 31.9  30.0 - 36.0 g/dL Final  . RDW 03/29/2020 14.7  11.5 - 15.5 % Final  . Platelets 03/29/2020 264  150 - 400 K/uL Final  . nRBC 03/29/2020 0.0  0.0 - 0.2 % Final  . Neutrophils Relative % 03/29/2020 60  % Final  . Neutro Abs 03/29/2020 5.1  1.7 - 7.7 K/uL Final  . Lymphocytes Relative 03/29/2020 28  % Final  . Lymphs Abs 03/29/2020 2.3  0.7 - 4.0 K/uL Final  . Monocytes Relative 03/29/2020 9  % Final  . Monocytes Absolute 03/29/2020 0.8  0.1 - 1.0 K/uL Final  . Eosinophils Relative 03/29/2020 1  % Final  . Eosinophils Absolute 03/29/2020 0.1  0.0 - 0.5 K/uL Final  . Basophils Relative 03/29/2020 1  % Final  . Basophils Absolute 03/29/2020 0.0  0.0 - 0.1 K/uL Final  . Immature Granulocytes 03/29/2020  1  % Final  . Abs Immature Granulocytes 03/29/2020 0.09* 0.00 - 0.07 K/uL Final   Performed at [facility], [address] [place], [place] [address]  . Preg Test, Ur 03/29/2020 NEGATIVE  NEGATIVE Final   Comment:        THE SENSITIVITY OF THIS METHODOLOGY IS >24 mIU/mL   . SARS Coronavirus 2 03/29/2020 NEGATIVE  NEGATIVE Final   Comment: (NOTE) SARS-CoV-2 target nucleic acids are NOT DETECTED.  The SARS-CoV-2 RNA is generally detectable in upper and lower respiratory specimens during the acute phase of infection. The lowest concentration of SARS-CoV-2 viral copies this assay can detect is 250 copies / mL. A negative result does not preclude SARS-CoV-2 infection and should not be used as the sole basis for treatment or other patient management decisions.  A negative result may occur with improper specimen collection / handling, submission of specimen other than nasopharyngeal swab, presence of viral  mutation(s) within the areas targeted by this assay, and inadequate number of viral copies (<250 copies / mL). A negative result must be combined with clinical observations, patient history, and epidemiological information.  Fact Sheet for Patients:   StrictlyIdeas.no  Fact Sheet for Healthcare Providers: BankingDealers.co.za  This test is not yet approved or                           cleared by the [place] FDA and has been authorized for detection and/or diagnosis of SARS-CoV-2 by FDA under an Emergency Use Authorization (EUA).  This EUA will remain in effect (meaning this test can be used) for the duration of the COVID-19 declaration under Section 564(b)(1) of the Act, 21 U.S.C. section 360bbb-3(b)(1), unless the authorization is terminated or revoked sooner.  Performed at [facility], [address], [place], [place] [address]   . Acetaminophen (Tylenol), Serum 03/29/2020 <10* 10 - 30 ug/mL Final   Comment:  (NOTE) Therapeutic concentrations vary significantly. A range of 10-30 ug/mL  may be an effective concentration for many patients. However, some  are best treated at concentrations outside of this range. Acetaminophen concentrations >150 ug/mL at 4 hours after ingestion  and >50 ug/mL at 12 hours after ingestion are often associated with  toxic reactions.  Performed at [facility], [address] [place], [place] [address]   . Salicylate Lvl 37/04/6268 <7.0* 7.0 - 30.0 mg/dL Final   Performed at [facility], [address], [place], [place] [address]  . Color, Urine 03/29/2020 STRAW* YELLOW Final  . APPearance 03/29/2020 CLEAR  CLEAR Final  . Specific Gravity, Urine 03/29/2020 1.006  1.005 - 1.030 Final  . pH 03/29/2020 7.0  5.0 - 8.0 Final  . Glucose, UA 03/29/2020 NEGATIVE  NEGATIVE mg/dL Final  . Hgb urine dipstick 03/29/2020 NEGATIVE  NEGATIVE Final  . Bilirubin Urine 03/29/2020 NEGATIVE  NEGATIVE Final  . Ketones, ur 03/29/2020 NEGATIVE  NEGATIVE mg/dL Final  . Protein, ur 03/29/2020 NEGATIVE  NEGATIVE mg/dL Final  . Nitrite 03/29/2020 NEGATIVE  NEGATIVE Final  . Chalmers Guest 03/29/2020 NEGATIVE  NEGATIVE Final   Performed at [facility], [address] [place], [place] [address]  Admission on 03/25/2020, Discharged on 03/27/2020  Component Date Value Ref Range Status  . SARS Coronavirus 2 03/25/2020 NEGATIVE  NEGATIVE Final   Comment: (NOTE) SARS-CoV-2 target nucleic acids are NOT DETECTED.  The SARS-CoV-2 RNA is generally detectable in upper and lower respiratory specimens during the acute phase of infection. The lowest concentration of SARS-CoV-2 viral copies this assay can detect is 250 copies / mL. A negative result does not preclude SARS-CoV-2 infection and should not be used as the sole basis for treatment or other patient management decisions.  A negative result may occur with improper specimen collection / handling, submission of specimen other than  nasopharyngeal swab, presence of viral mutation(s) within the areas targeted by this assay, and inadequate number of viral copies (<250 copies / mL). A negative result must be combined with clinical observations, patient history, and epidemiological information.  Fact Sheet for Patients:   StrictlyIdeas.no  Fact Sheet for Healthcare Providers: BankingDealers.co.za  This test is not yet approved or                           cleared by the [place] FDA and has been authorized for detection and/or diagnosis of SARS-CoV-2 by FDA under an Emergency  Use Authorization (EUA).  This EUA will remain in effect (meaning this test can be used) for the duration of the COVID-19 declaration under Section 564(b)(1) of the Act, 21 U.S.C. section 360bbb-3(b)(1), unless the authorization is terminated or revoked sooner.  Performed at [facility], [place] [address], [place], [place] [address]   . Opiates 03/25/2020 NONE DETECTED  NONE DETECTED Final  . Cocaine 03/25/2020 NONE DETECTED  NONE DETECTED Final  . Benzodiazepines 03/25/2020 NONE DETECTED  NONE DETECTED Final  . Amphetamines 03/25/2020 NONE DETECTED  NONE DETECTED Final  . Tetrahydrocannabinol 03/25/2020 POSITIVE* NONE DETECTED Final  . Barbiturates 03/25/2020 NONE DETECTED  NONE DETECTED Final   Comment: (NOTE) DRUG SCREEN FOR MEDICAL PURPOSES ONLY.  IF CONFIRMATION IS NEEDED FOR ANY PURPOSE, NOTIFY LAB WITHIN 5 DAYS.  LOWEST DETECTABLE LIMITS FOR URINE DRUG SCREEN Drug Class                     Cutoff (ng/mL) Amphetamine and metabolites    1000 Barbiturate and metabolites    200 Benzodiazepine                 166 Tricyclics and metabolites     300 Opiates and metabolites        300 Cocaine and metabolites        300 THC                            50 Performed at [facility], [place] [address], [place], [place] [address]   . I-stat hCG, quantitative  03/25/2020 <5.0  <5 mIU/mL Final  . Comment 3 03/25/2020          Final   Comment:   GEST. AGE      CONC.  (mIU/mL)   <=1 WEEK        5 - 50     2 WEEKS       50 - 500     3 WEEKS       100 - 10,000     4 WEEKS     1,000 - 30,000        FEMALE AND NON-PREGNANT FEMALE:     LESS THAN 5 mIU/mL   . WBC 03/25/2020 7.1  4.0 - 10.5 K/uL Final  . RBC 03/25/2020 4.79  3.87 - 5.11 MIL/uL Final  . Hemoglobin 03/25/2020 13.9  12.0 - 15.0 g/dL Final  . HCT 03/25/2020 43.5  36 - 46 % Final  . MCV 03/25/2020 90.8  80.0 - 100.0 fL Final  . MCH 03/25/2020 29.0  26.0 - 34.0 pg Final  . MCHC 03/25/2020 32.0  30.0 - 36.0 g/dL Final  . RDW 03/25/2020 15.2  11.5 - 15.5 % Final  . Platelets 03/25/2020 253  150 - 400 K/uL Final  . nRBC 03/25/2020 0.0  0.0 - 0.2 % Final   Performed at [facility], [place] [address], [place], [place] [address]  . Sodium 03/25/2020 140  135 - 145 mmol/L Final  . Potassium 03/25/2020 3.7  3.5 - 5.1 mmol/L Final  . Chloride 03/25/2020 106  98 - 111 mmol/L Final  . CO2 03/25/2020 22  22 - 32 mmol/L Final  . Glucose, Bld 03/25/2020 99  70 - 99 mg/dL Final   Glucose reference range applies only to samples taken after fasting for at least 8 hours.  . BUN 03/25/2020 11  6 - 20  mg/dL Final  . Creatinine, Ser 03/25/2020 0.79  0.44 - 1.00 mg/dL Final  . Calcium 03/25/2020 9.0  8.9 - 10.3 mg/dL Final  . Total Protein 03/25/2020 7.1  6.5 - 8.1 g/dL Final  . Albumin 03/25/2020 4.0  3.5 - 5.0 g/dL Final  . AST 03/25/2020 19  15 - 41 U/L Final  . ALT 03/25/2020 16  0 - 44 U/L Final  . Alkaline Phosphatase 03/25/2020 77  38 - 126 U/L Final  . Total Bilirubin 03/25/2020 0.4  0.3 - 1.2 mg/dL Final  . GFR calc non Af Amer 03/25/2020 >60  >60 mL/min Final  . GFR calc Af Amer 03/25/2020 >60  >60 mL/min Final  . Anion gap 03/25/2020 12  5 - 15 Final   Performed at [place] [address], [place], [place] [address]  . Valproic Acid Lvl 03/27/2020 59  50.0 - 100.0  ug/mL Final   Performed at [facility], [place] [address], [place], [place] [address]  Admission on 03/16/2020, Discharged on 03/22/2020  Component Date Value Ref Range Status  . WBC 03/17/2020 8.2  4.0 - 10.5 K/uL Final  . RBC 03/17/2020 4.61  3.87 - 5.11 MIL/uL Final  . Hemoglobin 03/17/2020 13.3  12.0 - 15.0 g/dL Final  . HCT 03/17/2020 41.9  36 - 46 % Final  . MCV 03/17/2020 90.9  80.0 - 100.0 fL Final  . MCH 03/17/2020 28.9  26.0 - 34.0 pg Final  . MCHC 03/17/2020 31.7  30.0 - 36.0 g/dL Final  . RDW 03/17/2020 15.5  11.5 - 15.5 % Final  . Platelets 03/17/2020 267  150 - 400 K/uL Final  . nRBC 03/17/2020 0.0  0.0 - 0.2 % Final  . Neutrophils Relative % 03/17/2020 53  % Final  . Neutro Abs 03/17/2020 4.4  1.7 - 7.7 K/uL Final  . Lymphocytes Relative 03/17/2020 35  % Final  . Lymphs Abs 03/17/2020 2.9  0.7 - 4.0 K/uL Final  . Monocytes Relative 03/17/2020 9  % Final  . Monocytes Absolute 03/17/2020 0.7  0.1 - 1.0 K/uL Final  . Eosinophils Relative 03/17/2020 1  % Final  . Eosinophils Absolute 03/17/2020 0.1  0.0 - 0.5 K/uL Final  . Basophils Relative 03/17/2020 1  % Final  . Basophils Absolute 03/17/2020 0.0  0.0 - 0.1 K/uL Final  . nRBC 03/17/2020 HIDE  0 /100 WBC Final  . Immature Granulocytes 03/17/2020 1  % Final  . Abs Immature Granulocytes 03/17/2020 0.06  0.00 - 0.07 K/uL Final   Performed at [facility], [place] [address] [place], [place] [address]  . Sodium 03/17/2020 138  135 - 145 mmol/L Final  . Potassium 03/17/2020 3.8  3.5 - 5.1 mmol/L Final  . Chloride 03/17/2020 104  98 - 111 mmol/L Final  . CO2 03/17/2020 24  22 - 32 mmol/L Final  . Glucose, Bld 03/17/2020 96  70 - 99 mg/dL Final   Glucose reference range applies only to samples taken after fasting for at least 8 hours.  . BUN 03/17/2020 15  6 - 20 mg/dL Final  . Creatinine, Ser 03/17/2020 0.84  0.44 - 1.00 mg/dL Final  . Calcium 03/17/2020 9.1  8.9 - 10.3 mg/dL Final  . Total  Protein 03/17/2020 7.3  6.5 - 8.1 g/dL Final  . Albumin 03/17/2020 4.0  3.5 - 5.0 g/dL Final  . AST 03/17/2020 18  15 - 41 U/L Final  . ALT 03/17/2020 16  0 -  44 U/L Final  . Alkaline Phosphatase 03/17/2020 83  38 - 126 U/L Final  . Total Bilirubin 03/17/2020 0.1* 0.3 - 1.2 mg/dL Final  . GFR calc non Af Amer 03/17/2020 >60  >60 mL/min Final  . GFR calc Af Amer 03/17/2020 >60  >60 mL/min Final  . Anion gap 03/17/2020 10  5 - 15 Final   Performed at [facility], [place] [address], [place], [place] [address]  . Hgb A1c MFr Bld 03/17/2020 5.5  4.8 - 5.6 % Final   Comment: (NOTE) Pre diabetes:          5.7%-6.4%  Diabetes:              >6.4%  Glycemic control for   <7.0% adults with diabetes   . Mean Plasma Glucose 03/17/2020 111.15  mg/dL Final   Performed at [facility] [address] [place], [place] [address]  . Preg, Serum 03/17/2020 NEGATIVE  NEGATIVE Final   Comment:        THE SENSITIVITY OF THIS METHODOLOGY IS >10 mIU/mL. Performed at [facility], [place] [address], [place], [place] [address]   . Color, Urine 03/20/2020 STRAW* YELLOW Final  . APPearance 03/20/2020 CLEAR  CLEAR Final  . Specific Gravity, Urine 03/20/2020 1.008  1.005 - 1.030 Final  . pH 03/20/2020 7.0  5.0 - 8.0 Final  . Glucose, UA 03/20/2020 NEGATIVE  NEGATIVE mg/dL Final  . Hgb urine dipstick 03/20/2020 NEGATIVE  NEGATIVE Final  . Bilirubin Urine 03/20/2020 NEGATIVE  NEGATIVE Final  . Ketones, ur 03/20/2020 NEGATIVE  NEGATIVE mg/dL Final  . Protein, ur 03/20/2020 NEGATIVE  NEGATIVE mg/dL Final  . Nitrite 03/20/2020 NEGATIVE  NEGATIVE Final  . Chalmers Guest 03/20/2020 NEGATIVE  NEGATIVE Final  . RBC / HPF 03/20/2020 0-5  0 - 5 RBC/hpf Final  . WBC, UA 03/20/2020 0-5  0 - 5 WBC/hpf Final  . Bacteria, UA 03/20/2020 RARE* NONE SEEN Final  . Squamous Epithelial / LPF 03/20/2020 0-5  0 - 5 Final   Performed at [facility], [place] [address],  [place], [place] [address]  . Opiates 03/20/2020 NONE DETECTED  NONE DETECTED Final  . Cocaine 03/20/2020 NONE DETECTED  NONE DETECTED Final  . Benzodiazepines 03/20/2020 NONE DETECTED  NONE DETECTED Final  . Amphetamines 03/20/2020 NONE DETECTED  NONE DETECTED Final  . Tetrahydrocannabinol 03/20/2020 POSITIVE* NONE DETECTED Final  . Barbiturates 03/20/2020 NONE DETECTED  NONE DETECTED Final   Comment: (NOTE) DRUG SCREEN FOR MEDICAL PURPOSES ONLY.  IF CONFIRMATION IS NEEDED FOR ANY PURPOSE, NOTIFY LAB WITHIN 5 DAYS.  LOWEST DETECTABLE LIMITS FOR URINE DRUG SCREEN Drug Class                     Cutoff (ng/mL) Amphetamine and metabolites    1000 Barbiturate and metabolites    200 Benzodiazepine                 716 Tricyclics and metabolites     300 Opiates and metabolites        300 Cocaine and metabolites        300 THC                            50 Performed at [facility], [place] [address], [place], [place] [address]   . TSH 03/21/2020 0.876  0.350 - 4.500 uIU/mL Final   Comment: Performed by  a 3rd Generation assay with a functional sensitivity of <=0.01 uIU/mL. Performed at [facility], [place] [address], [place], [place] [address]   Admission on 03/16/2020, Discharged on 03/16/2020  Component Date Value Ref Range Status  . SARS Coronavirus 2 03/16/2020 NEGATIVE  NEGATIVE Final   Comment: (NOTE) SARS-CoV-2 target nucleic acids are NOT DETECTED.  The SARS-CoV-2 RNA is generally detectable in upper and lower respiratory specimens during the acute phase of infection. The lowest concentration of SARS-CoV-2 viral copies this assay can detect is 250 copies / mL. A negative result does not preclude SARS-CoV-2 infection and should not be used as the sole basis for treatment or other patient management decisions.  A negative result may occur with improper specimen collection / handling, submission of specimen other than nasopharyngeal swab, presence of  viral mutation(s) within the areas targeted by this assay, and inadequate number of viral copies (<250 copies / mL). A negative result must be combined with clinical observations, patient history, and epidemiological information.  Fact Sheet for Patients:   StrictlyIdeas.no  Fact Sheet for Healthcare Providers: BankingDealers.co.za  This test is not yet approved or                           cleared by the [place] FDA and has been authorized for detection and/or diagnosis of SARS-CoV-2 by FDA under an Emergency Use Authorization (EUA).  This EUA will remain in effect (meaning this test can be used) for the duration of the COVID-19 declaration under Section 564(b)(1) of the Act, 21 U.S.C. section 360bbb-3(b)(1), unless the authorization is terminated or revoked sooner.  Performed at [facility], [place] [address] [place], [place] [address]   . SARS Coronavirus 2 Ag 03/16/2020 NEGATIVE  NEGATIVE Final   Comment: (NOTE) SARS-CoV-2 antigen NOT DETECTED.   Negative results are presumptive.  Negative results do not preclude SARS-CoV-2 infection and should not be used as the sole basis for treatment or other patient management decisions, including infection  control decisions, particularly in the presence of clinical signs and  symptoms consistent with COVID-19, or in those who have been in contact with the virus.  Negative results must be combined with clinical observations, patient history, and epidemiological information. The expected result is Negative.  Fact Sheet for Patients: PodPark.tn  Fact Sheet for Healthcare Providers: GiftContent.is   This test is not yet approved or cleared by the [place] FDA and  has been authorized for detection and/or diagnosis of SARS-CoV-2 by FDA under an Emergency Use Authorization (EUA).  This EUA will remain in effect  (meaning this test can be used) for the duration of  the C                          OVID-19 declaration under Section 564(b)(1) of the Act, 21 U.S.C. section 360bbb-3(b)(1), unless the authorization is terminated or revoked sooner.    Procedure visit on 01/19/2020  Component Date Value Ref Range Status  . Bacterial Vaginitis (gardnerella) 01/19/2020 Positive*  Final  . Candida Vaginitis 01/19/2020 Negative   Final  . Candida Glabrata 01/19/2020 Negative   Final  . Trichomonas 01/19/2020 Negative   Final  . Chlamydia 01/19/2020 Negative   Final  . Neisseria Gonorrhea 01/19/2020 Negative   Final  . Comment 01/19/2020 Normal Reference Range Bacterial Vaginosis - Negative   Final  . Comment 01/19/2020 Normal Reference Range Candida Species -  Negative   Final  . Comment 01/19/2020 Normal Reference Range Candida Galbrata - Negative   Final  . Comment 01/19/2020 Normal Reference Range Trichomonas - Negative   Final  . Comment 01/19/2020 Normal Reference [place] Chlamydia - Negative   Final  . Comment 01/19/2020 Normal Reference Range Neisseria Gonorrhea - Negative   Final  Admission on 10/15/2019, Discharged on 11/26/2019  Component Date Value Ref Range Status  . SARS Coronavirus 2 by RT PCR 10/15/2019 NEGATIVE  NEGATIVE Final   Comment: (NOTE) SARS-CoV-2 target nucleic acids are NOT DETECTED. The SARS-CoV-2 RNA is generally detectable in upper respiratoy specimens during the acute phase of infection. The lowest concentration of SARS-CoV-2 viral copies this assay can detect is 131 copies/mL. A negative result does not preclude SARS-Cov-2 infection and should not be used as the sole basis for treatment or other patient management decisions. A negative result may occur with  improper specimen collection/handling, submission of specimen other than nasopharyngeal swab, presence of viral mutation(s) within the areas targeted by this assay, and inadequate number of viral copies (<131 copies/mL).  A negative result must be combined with clinical observations, patient history, and epidemiological information. The expected result is Negative. Fact Sheet for Patients:  PinkCheek.be Fact Sheet for Healthcare Providers:  GravelBags.it This test is not yet ap                          proved or cleared by the [place] FDA and  has been authorized for detection and/or diagnosis of SARS-CoV-2 by FDA under an Emergency Use Authorization (EUA). This EUA will remain  in effect (meaning this test can be used) for the duration of the COVID-19 declaration under Section 564(b)(1) of the Act, 21 U.S.C. section 360bbb-3(b)(1), unless the authorization is terminated or revoked sooner.   . Influenza A by PCR 10/15/2019 NEGATIVE  NEGATIVE Final  . Influenza B by PCR 10/15/2019 NEGATIVE  NEGATIVE Final   Comment: (NOTE) The Xpert Xpress SARS-CoV-2/FLU/RSV assay is intended as an aid in  the diagnosis of influenza from Nasopharyngeal swab specimens and  should not be used as a sole basis for treatment. Nasal washings and  aspirates are unacceptable for Xpert Xpress SARS-CoV-2/FLU/RSV  testing. Fact Sheet for Patients: PinkCheek.be Fact Sheet for Healthcare Providers: GravelBags.it This test is not yet approved or cleared by the [place] FDA and  has been authorized for detection and/or diagnosis of SARS-CoV-2 by  FDA under an Emergency Use Authorization (EUA). This EUA will remain  in effect (meaning this test can be used) for the duration of the  Covid-19 declaration under Section 564(b)(1) of the Act, 21  U.S.C. section 360bbb-3(b)(1), unless the authorization is  terminated or revoked. Performed at [facility], [place] [address], [place],  [address]   . WBC 10/16/2019 6.5  4.0 - 10.5 K/uL Final  . RBC 10/16/2019 4.70  3.87 - 5.11 MIL/uL Final   . Hemoglobin 10/16/2019 13.4  12.0 - 15.0 g/dL Final  . HCT 10/16/2019 42.6  36 - 46 % Final  . MCV 10/16/2019 90.6  80.0 - 100.0 fL Final  . MCH 10/16/2019 28.5  26.0 - 34.0 pg Final  . MCHC 10/16/2019 31.5  30.0 - 36.0 g/dL Final  . RDW 10/16/2019 13.2  11.5 - 15.5 % Final  . Platelets 10/16/2019 230  150 - 400 K/uL Final  . nRBC 10/16/2019 0.0  0.0 - 0.2 % Final   Performed at  [facility], [place] [address] [place], [place] [address]  . Sodium 10/16/2019 140  135 - 145 mmol/L Final  . Potassium 10/16/2019 4.1  3.5 - 5.1 mmol/L Final  . Chloride 10/16/2019 106  98 - 111 mmol/L Final  . CO2 10/16/2019 25  22 - 32 mmol/L Final  . Glucose, Bld 10/16/2019 104* 70 - 99 mg/dL Final   Glucose reference range applies only to samples taken after fasting for at least 8 hours.  . BUN 10/16/2019 10  6 - 20 mg/dL Final  . Creatinine, Ser 10/16/2019 0.90  0.44 - 1.00 mg/dL Final  . Calcium 10/16/2019 9.5  8.9 - 10.3 mg/dL Final  . Total Protein 10/16/2019 7.6  6.5 - 8.1 g/dL Final  . Albumin 10/16/2019 4.4  3.5 - 5.0 g/dL Final  . AST 10/16/2019 23  15 - 41 U/L Final  . ALT 10/16/2019 16  0 - 44 U/L Final  . Alkaline Phosphatase 10/16/2019 67  38 - 126 U/L Final  . Total Bilirubin 10/16/2019 0.5  0.3 - 1.2 mg/dL Final  . GFR calc non Af Amer 10/16/2019 >60  >60 mL/min Final  . GFR calc Af Amer 10/16/2019 >60  >60 mL/min Final  . Anion gap 10/16/2019 9  5 - 15 Final   Performed at [facility], [place] [address] [place], [place] [address]  . Hgb A1c MFr Bld 10/16/2019 5.4  4.8 - 5.6 % Final   Comment: (NOTE) Pre diabetes:          5.7%-6.4% Diabetes:              >6.4% Glycemic control for   <7.0% adults with diabetes   . Mean Plasma Glucose 10/16/2019 108.28  mg/dL Final   Performed at [place] [address], [place], [place] [address]  . Magnesium 10/16/2019 2.0  1.7 - 2.4 mg/dL Final   Performed at [place]  [address], [place],  [address]  . Alcohol, Ethyl (B) 10/16/2019 <10  <10 mg/dL Final   Comment: (NOTE) Lowest detectable limit for serum alcohol is 10 mg/dL. For medical purposes only. Performed at [facility], [place] [address] [place],  [address]   . Cholesterol 10/16/2019 138  0 - 200 mg/dL Final  . Triglycerides 10/16/2019 60  <150 mg/dL Final  . HDL 10/16/2019 36* >40 mg/dL Final  . Total CHOL/HDL Ratio 10/16/2019 3.8  RATIO Final  . VLDL 10/16/2019 12  0 - 40 mg/dL Final  . LDL Cholesterol 10/16/2019 90  0 - 99 mg/dL Final   Comment:        Total Cholesterol/HDL:CHD Risk Coronary Heart Disease Risk Table                     Men   Women  1/2 Average Risk   3.4   3.3  Average Risk       5.0   4.4  2 X Average Risk   9.6   7.1  3 X Average Risk  23.4   11.0        Use the calculated Patient Ratio above and the CHD Risk Table to determine the patient's CHD Risk.        ATP III CLASSIFICATION (LDL):  <100     mg/dL   Optimal  100-129  mg/dL   Near or Above  Optimal  130-159  mg/dL   Borderline  160-189  mg/dL   High  >190     mg/dL   Very High Performed at [place] [address] [place], [place] [address]   . Total Protein 10/16/2019 7.6  6.5 - 8.1 g/dL Final  . Albumin 10/16/2019 4.4  3.5 - 5.0 g/dL Final  . AST 10/16/2019 23  15 - 41 U/L Final  . ALT 10/16/2019 16  0 - 44 U/L Final  . Alkaline Phosphatase 10/16/2019 67  38 - 126 U/L Final  . Total Bilirubin 10/16/2019 0.5  0.3 - 1.2 mg/dL Final  . Bilirubin, Direct 10/16/2019 0.1  0.0 - 0.2 mg/dL Final  . Indirect Bilirubin 10/16/2019 0.4  0.3 - 0.9 mg/dL Final   Performed at [facility], [place] [address] [place], [place] [address]  . Prolactin 10/16/2019 152.0* 4.8 - 23.3 ng/mL Final   Comment: (NOTE) Performed At: [facility] [place], [place] [id number] Rush Farmer MD AS:[id number]   . Color,  Urine 10/16/2019 YELLOW  YELLOW Final  . APPearance 10/16/2019 HAZY* CLEAR Final  . Specific Gravity, Urine 10/16/2019 1.010  1.005 - 1.030 Final  . pH 10/16/2019 6.0  5.0 - 8.0 Final  . Glucose, UA 10/16/2019 NEGATIVE  NEGATIVE mg/dL Final  . Hgb urine dipstick 10/16/2019 SMALL* NEGATIVE Final  . Bilirubin Urine 10/16/2019 NEGATIVE  NEGATIVE Final  . Ketones, ur 10/16/2019 5* NEGATIVE mg/dL Final  . Protein, ur 10/16/2019 NEGATIVE  NEGATIVE mg/dL Final  . Nitrite 10/16/2019 NEGATIVE  NEGATIVE Final  . Leukocytes,Ua 10/16/2019 LARGE* NEGATIVE Final  . RBC / HPF 10/16/2019 11-20  0 - 5 RBC/hpf Final  . WBC, UA 10/16/2019 >50* 0 - 5 WBC/hpf Final  . Bacteria, UA 10/16/2019 RARE* NONE SEEN Final  . Squamous Epithelial / LPF 10/16/2019 0-5  0 - 5 Final  . Mucus 10/16/2019 PRESENT   Final  . Ca Oxalate Crys, UA 10/16/2019 PRESENT   Final   Performed at [facility], [place] [address], [place], [place] [address]  . Preg Test, Ur 10/16/2019 NEGATIVE  NEGATIVE Final   Comment:        THE SENSITIVITY OF THIS METHODOLOGY IS >20 mIU/mL. Performed at [facility], [place] [address] [place], [place] [address]   . Specimen Description 10/23/2019    Final                   Value:URINE, RANDOM Performed at [facility], [place] [address], [place], [place] [address]   . Special Requests 10/23/2019    Final                   Value:NONE Performed at [facility], [place] [address] [place], [place] [address]   . Culture 10/23/2019 >=100,000 COLONIES/mL STAPHYLOCOCCUS EPIDERMIDIS*  Final  . Report Status 10/23/2019 10/26/2019 FINAL   Final  . Organism ID, Bacteria 10/23/2019 STAPHYLOCOCCUS EPIDERMIDIS*  Final  . Color, Urine 11/02/2019 YELLOW  YELLOW Final  . APPearance 11/02/2019 CLOUDY* CLEAR Final  . Specific Gravity, Urine 11/02/2019 1.015  1.005 - 1.030 Final  . pH 11/02/2019 7.0  5.0 - 8.0 Final  . Glucose, UA 11/02/2019  NEGATIVE  NEGATIVE mg/dL Final  . Hgb urine dipstick 11/02/2019 NEGATIVE  NEGATIVE Final  . Bilirubin Urine 11/02/2019 NEGATIVE  NEGATIVE Final  . Ketones, ur 11/02/2019 NEGATIVE  NEGATIVE mg/dL Final  . Protein, ur 11/02/2019 NEGATIVE  NEGATIVE mg/dL Final  . Nitrite 11/02/2019 NEGATIVE  NEGATIVE Final  . Chalmers Guest 11/02/2019 NEGATIVE  NEGATIVE Final  . RBC / HPF 11/02/2019 0-5  0 - 5 RBC/hpf Final  . WBC, UA 11/02/2019 0-5  0 - 5 WBC/hpf Final  . Bacteria, UA 11/02/2019 NONE SEEN  NONE SEEN Final  . Squamous Epithelial / LPF 11/02/2019 6-10  0 - 5 Final  . Mucus 11/02/2019 PRESENT   Final  . Amorphous Crystal 11/02/2019 PRESENT   Final   Performed at [facility], [place] [address], [place], [place] [address]  . Prolactin 11/11/2019 113.0* 4.8 - 23.3 ng/mL Final   Comment: (NOTE) Performed At: [facility] [place], [place] [id number] Rush Farmer MD HF:[id number]   . SARS Coronavirus 2 by RT PCR 11/11/2019 NEGATIVE  NEGATIVE Final   Comment: (NOTE) SARS-CoV-2 target nucleic acids are NOT DETECTED. The SARS-CoV-2 RNA is generally detectable in upper respiratoy specimens during the acute phase of infection. The lowest concentration of SARS-CoV-2 viral copies this assay can detect is 131 copies/mL. A negative result does not preclude SARS-Cov-2 infection and should not be used as the sole basis for treatment or other patient management decisions. A negative result may occur with  improper specimen collection/handling, submission of specimen other than nasopharyngeal swab, presence of viral mutation(s) within the areas targeted by this assay, and inadequate number of viral copies (<131 copies/mL). A negative result must be combined with clinical observations, patient history, and epidemiological information. The expected result is Negative. Fact Sheet for Patients:  PinkCheek.be Fact Sheet for Healthcare  Providers:  GravelBags.it This test is not yet ap                          proved or cleared by the [place] FDA and  has been authorized for detection and/or diagnosis of SARS-CoV-2 by FDA under an Emergency Use Authorization (EUA). This EUA will remain  in effect (meaning this test can be used) for the duration of the COVID-19 declaration under Section 564(b)(1) of the Act, 21 U.S.C. section 360bbb-3(b)(1), unless the authorization is terminated or revoked sooner.   . Influenza A by PCR 11/11/2019 NEGATIVE  NEGATIVE Final  . Influenza B by PCR 11/11/2019 NEGATIVE  NEGATIVE Final   Comment: (NOTE) The Xpert Xpress SARS-CoV-2/FLU/RSV assay is intended as an aid in  the diagnosis of influenza from Nasopharyngeal swab specimens and  should not be used as a sole basis for treatment. Nasal washings and  aspirates are unacceptable for Xpert Xpress SARS-CoV-2/FLU/RSV  testing. Fact Sheet for Patients: PinkCheek.be Fact Sheet for Healthcare Providers: GravelBags.it This test is not yet approved or cleared by the [place] FDA and  has been authorized for detection and/or diagnosis of SARS-CoV-2 by  FDA under an Emergency Use Authorization (EUA). This EUA will remain  in effect (meaning this test can be used) for the duration of the  Covid-19 declaration under Section 564(b)(1) of the Act, 21  U.S.C. section 360bbb-3(b)(1), unless the authorization is  terminated or revoked. Performed at [facility], [place] [address], [place], [place] [address]     Allergies: Abilify [aripiprazole] and Zoloft [sertraline hcl]  PTA Medications: (Not in a hospital admission)   Medical Decision Making   Recommendations  Based on my evaluation the patient does not appear to have an emergency medical condition. The patient will remain under observation overnight and reassess in the A.M. to  determine  if she meets the criteria for psychiatric inpatient admission or can be discharged. Caroline Sauger, NP 05/21/20  3:55 AM

## 2020-05-21 NOTE — ED Notes (Signed)
Patient given meal. 

## 2020-05-21 NOTE — Progress Notes (Signed)
Julia [name] received 2 turkeys sandwiuches and a sprite. She is calmer and less agitated. Later she made several calls.

## 2020-05-21 NOTE — ED Provider Notes (Signed)
Behavioral Health Progress Note  Date and Time: 05/21/2020 12:32 PM Name: Julia [name] MRN:  [id number]  Subjective: Patient states "I have been staying at the [facility] but they asked me to leave because they do not feel safe with me there."  Patient reports she would prefer not to return to [facility] as there is no food there.  Patient reports she is currently awaiting food stamps.  Patient reports she would like to be "in a facility with locked doors, but all the shelters are booked."  Patient reports recent stressors include homelessness and the belief that she should be enrolled in the witness protection program but feels like the police "are not doing anything to help her."  Patient endorses feelings of paranoia times approximately 1 year.  Patient exhibits delusional thought content.  Patient reports for 1 year feeling like she should be involved in the witness protection program, does not disclose reason for desiring witness protection.  Patient reports she believes she may have missed a court date in the past.  Patient reports recently she has been "in and out of the hospital."  Patient currently homeless in [place].  Patient denies access to weapons.  Patient currently followed by ACT team, last visited her on Friday.  Patient reports her act team brings medications to her home several times per week.  Patient reports compliance with medications.  Patient unable to recall specific medications.  Patient denies alcohol use.  Patient endorses marijuana use, denies substance use aside from marijuana.  Patient assessed by nurse practitioner.  Patient presents with irritable mood.  Patient states "I want to get back to sleep, I would like to stay here for 1 more night, you cannot discharge me!"  Patient denies suicidal and homicidal ideations.  Patient denies auditory or visual hallucinations.  There is no indication patient is responding to internal stimuli.  Patient offered support  and encouragement.  Diagnosis:  Final diagnoses:  Cocaine dependence with cocaine-induced psychotic disorder with complication (HCC)  Schizophrenia, paranoid ([place])    Total Time spent with patient: 30 minutes  Past Psychiatric History: Schizoaffective disorder, polysubstance abuse, paranoid schizophrenia, cocaine dependence with cocaine induced psychotic disorder Past Medical History:  Past Medical History:  Diagnosis Date  . Burning with urination 05/03/2015  . Contraceptive management 05/03/2015  . Depression   . Dysmenorrhea 12/30/2013  . Heroin addiction ([place])   . Menorrhagia 12/30/2013  . Menstrual extraction 12/30/2013  . Migraines   . Schizophrenia ([place])   . Social anxiety disorder 09/25/2016  . Suicidal ideations   . Vaginal odor 05/03/2015    Past Surgical History:  Procedure Laterality Date  . NO PAST SURGERIES     Family History:  Family History  Problem Relation Age of Onset  . Depression Mother   . Hypertension Father   . Hyperlipidemia Father   . Cancer Paternal Grandmother        breast, uterine  . Cirrhosis Paternal Grandfather        due to alcohol   Family Psychiatric  History: None reported Social History:  Social History   Substance and Sexual Activity  Alcohol Use Not Currently     Social History   Substance and Sexual Activity  Drug Use Not Currently  . Types: Marijuana, Oxycodone, Cocaine    Social History   Socioeconomic History  . Marital status: Single    Spouse name: Not on file  . Number of children: Not on file  . Years of education:  Not on file  . Highest education level: Not on file  Occupational History  . Occupation: Unemployed  Tobacco Use  . Smoking status: Current Every Day Smoker    Packs/day: 1.00    Types: Cigarettes  . Smokeless tobacco: Never Used  Vaping Use  . Vaping Use: Never used  Substance and Sexual Activity  . Alcohol use: Not Currently  . Drug use: Not Currently    Types: Marijuana, Oxycodone,  Cocaine  . Sexual activity: Not on file  Other Topics Concern  . Not on file  Social History Narrative   06/23/2019:  Pt stated that she is homeless, that she is a high school graduate, and that she is unemployed and not followed by any outpatient provider.      Lives with Dad. Mom has LGD, lives in [place]. 11th grader. Dog.   Social Determinants of Health   Financial Resource Strain:   . Difficulty of Paying Living Expenses: Not on file  Food Insecurity:   . Worried About Charity fundraiser in the Last Year: Not on file  . Ran Out of Food in the Last Year: Not on file  Transportation Needs:   . Lack of Transportation (Medical): Not on file  . Lack of Transportation (Non-Medical): Not on file  Physical Activity:   . Days of Exercise per Week: Not on file  . Minutes of Exercise per Session: Not on file  Stress:   . Feeling of Stress : Not on file  Social Connections:   . Frequency of Communication with Friends and Family: Not on file  . Frequency of Social Gatherings with Friends and Family: Not on file  . Attends Religious Services: Not on file  . Active Member of Clubs or Organizations: Not on file  . Attends Archivist Meetings: Not on file  . Marital Status: Not on file   SDOH:  SDOH Screenings   Alcohol Screen: Low Risk   . Last Alcohol Screening Score (AUDIT): 0  Depression (PHQ2-9):   . PHQ-2 Score: Not on file  Financial Resource Strain:   . Difficulty of Paying Living Expenses: Not on file  Food Insecurity:   . Worried About Charity fundraiser in the Last Year: Not on file  . Ran Out of Food in the Last Year: Not on file  Housing:   . Last Housing Risk Score: Not on file  Physical Activity:   . Days of Exercise per Week: Not on file  . Minutes of Exercise per Session: Not on file  Social Connections:   . Frequency of Communication with Friends and Family: Not on file  . Frequency of Social Gatherings with Friends and Family: Not on file  . Attends  Religious Services: Not on file  . Active Member of Clubs or Organizations: Not on file  . Attends Archivist Meetings: Not on file  . Marital Status: Not on file  Stress:   . Feeling of Stress : Not on file  Tobacco Use: High Risk  . Smoking Tobacco Use: Current Every Day Smoker  . Smokeless Tobacco Use: Never Used  Transportation Needs:   . Film/video editor (Medical): Not on file  . Lack of Transportation (Non-Medical): Not on file   Additional Social History:                         Sleep: Fair  Appetite:  Good  Current Medications:  Current Facility-Administered Medications  Medication Dose Route Frequency Provider Last Rate Last Admin  . acetaminophen (TYLENOL) tablet 650 mg  650 mg Oral Q6H PRN Caroline Sauger, NP      . alum & mag hydroxide-simeth (MAALOX/MYLANTA) 200-200-20 MG/5ML suspension 30 mL  30 mL Oral Q4H PRN Caroline Sauger, NP      . benztropine (COGENTIN) tablet 2 mg  2 mg Oral BID PRN Caroline Sauger, NP      . buPROPion (WELLBUTRIN XL) 24 hr tablet 150 mg  150 mg Oral Daily Caroline Sauger, NP   150 mg at 05/21/20 1035  . divalproex (DEPAKOTE) DR tablet 750 mg  750 mg Oral Q12H Caroline Sauger, NP   750 mg at 05/21/20 1035  . FLUoxetine (PROZAC) capsule 40 mg  40 mg Oral Daily Caroline Sauger, NP   40 mg at 05/21/20 1036  . gabapentin (NEURONTIN) capsule 400 mg  400 mg Oral TID Caroline Sauger, NP   400 mg at 05/21/20 1036  . hydrOXYzine (ATARAX/VISTARIL) tablet 50 mg  50 mg Oral TID PRN Caroline Sauger, NP      . magnesium hydroxide (MILK OF MAGNESIA) suspension 30 mL  30 mL Oral Daily PRN Caroline Sauger, NP      . OLANZapine zydis (ZYPREXA) disintegrating tablet 30 mg  30 mg Oral QHS Caroline Sauger, NP      . pantoprazole (PROTONIX) EC tablet 40 mg  40 mg Oral QHS Caroline Sauger, NP      . traZODone (DESYREL) tablet 100 mg  100 mg Oral QHS PRN Caroline Sauger, NP        Current Outpatient Medications  Medication Sig Dispense Refill  . benztropine (COGENTIN) 2 MG tablet Take 1 tablet (2 mg total) by mouth 2 (two) times daily as needed for tremors (EPS). 15 tablet 0  . buPROPion (WELLBUTRIN XL) 150 MG 24 hr tablet Take 1 tablet (150 mg total) by mouth daily. 30 tablet 0  . divalproex (DEPAKOTE) 250 MG DR tablet Take 3 tablets (750 mg total) by mouth every 12 (twelve) hours. 180 tablet 0  . FLUoxetine (PROZAC) 40 MG capsule Take 1 capsule (40 mg total) by mouth daily. 30 capsule 0  . gabapentin (NEURONTIN) 400 MG capsule Take 1 capsule (400 mg total) by mouth 3 (three) times daily. 90 capsule 0  . hydrOXYzine (ATARAX/VISTARIL) 50 MG tablet Take 1 tablet (50 mg total) by mouth 3 (three) times daily as needed for anxiety. 90 tablet 0  . olanzapine zydis (ZYPREXA) 15 MG disintegrating tablet Take 2 tablets (30 mg total) by mouth at bedtime. 60 tablet 0  . pantoprazole (PROTONIX) 40 MG tablet Take 1 tablet (40 mg total) by mouth at bedtime. 30 tablet 0  . traZODone (DESYREL) 100 MG tablet Take 1 tablet (100 mg total) by mouth at bedtime as needed for sleep. 15 tablet 0    Labs  Lab Results:  Admission on 05/21/2020  Component Date Value Ref Range Status  . POC Amphetamine UR 05/21/2020 None Detected  None Detected Preliminary  . POC Secobarbital (BAR) 05/21/2020 None Detected  None Detected Preliminary  . POC Buprenorphine (BUP) 05/21/2020 None Detected  None Detected Preliminary  . POC Oxazepam (BZO) 05/21/2020 Positive* None Detected Preliminary  . POC Cocaine UR 05/21/2020 None Detected  None Detected Preliminary  . POC Methamphetamine UR 05/21/2020 None Detected  None Detected Preliminary  . POC Morphine 05/21/2020 None Detected  None Detected Preliminary  . POC Oxycodone UR 05/21/2020 None Detected  None Detected Preliminary  . POC  Methadone UR 05/21/2020 None Detected  None Detected Preliminary  . POC Marijuana UR 05/21/2020 Positive* None Detected  Preliminary  Admission on 05/20/2020, Discharged on 05/21/2020  Component Date Value Ref Range Status  . Sodium 05/20/2020 138  135 - 145 mmol/L Final  . Potassium 05/20/2020 3.7  3.5 - 5.1 mmol/L Final  . Chloride 05/20/2020 105  98 - 111 mmol/L Final  . CO2 05/20/2020 22  22 - 32 mmol/L Final  . Glucose, Bld 05/20/2020 102* 70 - 99 mg/dL Final   Glucose reference range applies only to samples taken after fasting for at least 8 hours.  . BUN 05/20/2020 7  6 - 20 mg/dL Final  . Creatinine, Ser 05/20/2020 0.70  0.44 - 1.00 mg/dL Final  . Calcium 05/20/2020 8.6* 8.9 - 10.3 mg/dL Final  . Total Protein 05/20/2020 6.7  6.5 - 8.1 g/dL Final  . Albumin 05/20/2020 3.6  3.5 - 5.0 g/dL Final  . AST 05/20/2020 16  15 - 41 U/L Final  . ALT 05/20/2020 13  0 - 44 U/L Final  . Alkaline Phosphatase 05/20/2020 79  38 - 126 U/L Final  . Total Bilirubin 05/20/2020 0.5  0.3 - 1.2 mg/dL Final  . GFR, Estimated 05/20/2020 >60  >60 mL/min Final   Comment: (NOTE) Calculated using the CKD-EPI Creatinine Equation (2021)   . Anion gap 05/20/2020 11  5 - 15 Final   Performed at [facility], [place] [address], [place], [place] [address]  . Alcohol, Ethyl (B) 05/20/2020 <10  <10 mg/dL Final   Comment: (NOTE) Lowest detectable limit for serum alcohol is 10 mg/dL.  For medical purposes only. Performed at [facility], [place] [address], [place], [place] [address]   . WBC 05/20/2020 9.0  4.0 - 10.5 K/uL Final  . RBC 05/20/2020 4.23  3.87 - 5.11 MIL/uL Final  . Hemoglobin 05/20/2020 12.4  12.0 - 15.0 g/dL Final  . HCT 05/20/2020 39.4  36 - 46 % Final  . MCV 05/20/2020 93.1  80.0 - 100.0 fL Final  . MCH 05/20/2020 29.3  26.0 - 34.0 pg Final  . MCHC 05/20/2020 31.5  30.0 - 36.0 g/dL Final  . RDW 05/20/2020 13.8  11.5 - 15.5 % Final  . Platelets 05/20/2020 227  150 - 400 K/uL Final  . nRBC 05/20/2020 0.0  0.0 - 0.2 % Final   Performed at [facility], [place]  [address], [place], [place] [address]  . I-stat hCG, quantitative 05/20/2020 <5.0  <5 mIU/mL Final  . Comment 3 05/20/2020          Final   Comment:   GEST. AGE      CONC.  (mIU/mL)   <=1 WEEK        5 - 50     2 WEEKS       50 - 500     3 WEEKS       100 - 10,000     4 WEEKS     1,000 - 30,000        FEMALE AND NON-PREGNANT FEMALE:     LESS THAN 5 mIU/mL   . SARS Coronavirus 2 by RT PCR 05/20/2020 NEGATIVE  NEGATIVE Final   Comment: (NOTE) SARS-CoV-2 target nucleic acids are NOT DETECTED.  The SARS-CoV-2 RNA is generally detectable in upper respiratoy specimens during the acute phase of infection. The lowest concentration of SARS-CoV-2 viral copies this assay can detect is 131 copies/mL. A negative  result does not preclude SARS-Cov-2 infection and should not be used as the sole basis for treatment or other patient management decisions. A negative result may occur with  improper specimen collection/handling, submission of specimen other than nasopharyngeal swab, presence of viral mutation(s) within the areas targeted by this assay, and inadequate number of viral copies (<131 copies/mL). A negative result must be combined with clinical observations, patient history, and epidemiological information. The expected result is Negative.  Fact Sheet for Patients:  P[place]Cheek.be  Fact Sheet for Healthcare Providers:  GravelBags.it  This test is no                          t yet approved or cleared by the [place] FDA and  has been authorized for detection and/or diagnosis of SARS-CoV-2 by FDA under an Emergency Use Authorization (EUA). This EUA will remain  in effect (meaning this test can be used) for the duration of the COVID-19 declaration under Section 564(b)(1) of the Act, 21 U.S.C. section 360bbb-3(b)(1), unless the authorization is terminated or revoked sooner.    . Influenza A by PCR 05/20/2020 NEGATIVE  NEGATIVE  Final  . Influenza B by PCR 05/20/2020 NEGATIVE  NEGATIVE Final   Comment: (NOTE) The Xpert Xpress SARS-CoV-2/FLU/RSV assay is intended as an aid in  the diagnosis of influenza from Nasopharyngeal swab specimens and  should not be used as a sole basis for treatment. Nasal washings and  aspirates are unacceptable for Xpert Xpress SARS-CoV-2/FLU/RSV  testing.  Fact Sheet for Patients: [place]Cheek.be  Fact Sheet for Healthcare Providers: GravelBags.it  This test is not yet approved or cleared by the [place] FDA and  has been authorized for detection and/or diagnosis of SARS-CoV-2 by  FDA under an Emergency Use Authorization (EUA). This EUA will remain  in effect (meaning this test can be used) for the duration of the  Covid-19 declaration under Section 564(b)(1) of the Act, 21  U.S.C. section 360bbb-3(b)(1), unless the authorization is  terminated or revoked. Performed at [facility], [place] [address], [place], [place] [address]   . Salicylate Lvl 23/53/6144 <7.0* 7.0 - 30.0 mg/dL Final   Performed at [place] [address] [place], [place] [address]  . Acetaminophen (Tylenol), Serum 05/20/2020 <10* 10 - 30 ug/mL Final   Comment: (NOTE) Therapeutic concentrations vary significantly. A range of 10-30 ug/mL  may be an effective concentration for many patients. However, some  are best treated at concentrations outside of this range. Acetaminophen concentrations >150 ug/mL at 4 hours after ingestion  and >50 ug/mL at 12 hours after ingestion are often associated with  toxic reactions.  Performed at [facility], [place] [address], [place], [place] [address]   Admission on 05/08/2020, Discharged on 05/17/2020  Component Date Value Ref Range Status  . SARS Coronavirus 2 by RT PCR 05/08/2020 NEGATIVE  NEGATIVE Final   Comment: (NOTE) SARS-CoV-2 target nucleic acids  are NOT DETECTED.  The SARS-CoV-2 RNA is generally detectable in upper respiratoy specimens during the acute phase of infection. The lowest concentration of SARS-CoV-2 viral copies this assay can detect is 131 copies/mL. A negative result does not preclude SARS-Cov-2 infection and should not be used as the sole basis for treatment or other patient management decisions. A negative result may occur with  improper specimen collection/handling, submission of specimen other than nasopharyngeal swab, presence of viral mutation(s) within the areas targeted by this assay, and inadequate  number of viral copies (<131 copies/mL). A negative result must be combined with clinical observations, patient history, and epidemiological information. The expected result is Negative.  Fact Sheet for Patients:  [place]Cheek.be  Fact Sheet for Healthcare Providers:  GravelBags.it  This test is no                          t yet approved or cleared by the [place] FDA and  has been authorized for detection and/or diagnosis of SARS-CoV-2 by FDA under an Emergency Use Authorization (EUA). This EUA will remain  in effect (meaning this test can be used) for the duration of the COVID-19 declaration under Section 564(b)(1) of the Act, 21 U.S.C. section 360bbb-3(b)(1), unless the authorization is terminated or revoked sooner.    . Influenza A by PCR 05/08/2020 NEGATIVE  NEGATIVE Final  . Influenza B by PCR 05/08/2020 NEGATIVE  NEGATIVE Final   Comment: (NOTE) The Xpert Xpress SARS-CoV-2/FLU/RSV assay is intended as an aid in  the diagnosis of influenza from Nasopharyngeal swab specimens and  should not be used as a sole basis for treatment. Nasal washings and  aspirates are unacceptable for Xpert Xpress SARS-CoV-2/FLU/RSV  testing.  Fact Sheet for Patients: [place]Cheek.be  Fact Sheet for Healthcare  Providers: GravelBags.it  This test is not yet approved or cleared by the [place] FDA and  has been authorized for detection and/or diagnosis of SARS-CoV-2 by  FDA under an Emergency Use Authorization (EUA). This EUA will remain  in effect (meaning this test can be used) for the duration of the  Covid-19 declaration under Section 564(b)(1) of the Act, 21  U.S.C. section 360bbb-3(b)(1), unless the authorization is  terminated or revoked. Performed at [facility], [place] [address], [place], [place] [address]   . Opiates 05/09/2020 NONE DETECTED  NONE DETECTED Final  . Cocaine 05/09/2020 POSITIVE* NONE DETECTED Final  . Benzodiazepines 05/09/2020 POSITIVE* NONE DETECTED Final  . Amphetamines 05/09/2020 NONE DETECTED  NONE DETECTED Final  . Tetrahydrocannabinol 05/09/2020 POSITIVE* NONE DETECTED Final  . Barbiturates 05/09/2020 NONE DETECTED  NONE DETECTED Final   Comment: (NOTE) DRUG SCREEN FOR MEDICAL PURPOSES ONLY.  IF CONFIRMATION IS NEEDED FOR ANY PURPOSE, NOTIFY LAB WITHIN 5 DAYS.  LOWEST DETECTABLE LIMITS FOR URINE DRUG SCREEN Drug Class                     Cutoff (ng/mL) Amphetamine and metabolites    1000 Barbiturate and metabolites    200 Benzodiazepine                 482 Tricyclics and metabolites     300 Opiates and metabolites        300 Cocaine and metabolites        300 THC                            50 Performed at [facility], [place] [address] [place], [place] [address]   . WBC 05/09/2020 7.8  4.0 - 10.5 K/uL Final  . RBC 05/09/2020 4.19  3.87 - 5.11 MIL/uL Final  . Hemoglobin 05/09/2020 12.1  12.0 - 15.0 g/dL Final  . HCT 05/09/2020 37.9  36 - 46 % Final  . MCV 05/09/2020 90.5  80.0 - 100.0 fL Final  . MCH 05/09/2020 28.9  26.0 - 34.0 pg Final  . MCHC 05/09/2020 31.9  30.0 -  36.0 g/dL Final  . RDW 05/09/2020 14.0  11.5 - 15.5 % Final  . Platelets 05/09/2020 291  150 - 400 K/uL Final  .  nRBC 05/09/2020 0.0  0.0 - 0.2 % Final   Performed at [facility], [place] [address], [place], [place] [address]  . Sodium 05/09/2020 140  135 - 145 mmol/L Final  . Potassium 05/09/2020 4.3  3.5 - 5.1 mmol/L Final  . Chloride 05/09/2020 105  98 - 111 mmol/L Final  . CO2 05/09/2020 26  22 - 32 mmol/L Final  . Glucose, Bld 05/09/2020 101* 70 - 99 mg/dL Final   Glucose reference range applies only to samples taken after fasting for at least 8 hours.  . BUN 05/09/2020 16  6 - 20 mg/dL Final  . Creatinine, Ser 05/09/2020 0.87  0.44 - 1.00 mg/dL Final  . Calcium 05/09/2020 8.9  8.9 - 10.3 mg/dL Final  . Total Protein 05/09/2020 6.9  6.5 - 8.1 g/dL Final  . Albumin 05/09/2020 3.6  3.5 - 5.0 g/dL Final  . AST 05/09/2020 14* 15 - 41 U/L Final  . ALT 05/09/2020 20  0 - 44 U/L Final  . Alkaline Phosphatase 05/09/2020 78  38 - 126 U/L Final  . Total Bilirubin 05/09/2020 0.4  0.3 - 1.2 mg/dL Final  . GFR, Estimated 05/09/2020 >60  >60 mL/min Final   Comment: (NOTE) Calculated using the CKD-EPI Creatinine Equation (2021)   . Anion gap 05/09/2020 9  5 - 15 Final   Performed at [facility], [place] [address] [place], [place] [address]  . Hgb A1c MFr Bld 05/09/2020 5.4  4.8 - 5.6 % Final   Comment: (NOTE)         Prediabetes: 5.7 - 6.4         Diabetes: >6.4         Glycemic control for adults with diabetes: <7.0   . Mean Plasma Glucose 05/09/2020 108  mg/dL Final   Comment: (NOTE) Performed At: [facility] [place], [place] [id number] [place] MD DP:[id number]   . Preg Test, Ur 05/09/2020 NEGATIVE  NEGATIVE Final   Comment:        THE SENSITIVITY OF THIS METHODOLOGY IS >20 mIU/mL. Performed at [facility], [place] [address], [place], [place] [address]   . TSH 05/10/2020 0.354  0.350 - 4.500 uIU/mL Final   Comment: Performed by a 3rd Generation assay with a functional sensitivity of <=0.01 uIU/mL. Performed at  [facility], [place] [address], [place], [place] [address]   . Valproic Acid Lvl 05/09/2020 84  50.0 - 100.0 ug/mL Final   Performed at [facility], [place] [address] [place], [place] [address]  . Prolactin 05/10/2020 142.0* 4.8 - 23.3 ng/mL Final   Comment: (NOTE) Performed At: [facility] [place], [place] [id number] [place] Farmer MD YK:[id number]   . WBC 05/16/2020 7.8  4.0 - 10.5 K/uL Final  . RBC 05/16/2020 4.43  3.87 - 5.11 MIL/uL Final  . Hemoglobin 05/16/2020 12.8  12.0 - 15.0 g/dL Final  . HCT 05/16/2020 39.9  36 - 46 % Final  . MCV 05/16/2020 90.1  80.0 - 100.0 fL Final  . MCH 05/16/2020 28.9  26.0 - 34.0 pg Final  . MCHC 05/16/2020 32.1  30.0 - 36.0 g/dL Final  . RDW 05/16/2020 13.8  11.5 - 15.5 % Final  . Platelets 05/16/2020 253  150 - 400 K/uL  Final  . nRBC 05/16/2020 0.0  0.0 - 0.2 % Final  . Neutrophils Relative % 05/16/2020 42  % Final  . Neutro Abs 05/16/2020 3.3  1.7 - 7.7 K/uL Final  . Lymphocytes Relative 05/16/2020 47  % Final  . Lymphs Abs 05/16/2020 3.5  0.7 - 4.0 K/uL Final  . Monocytes Relative 05/16/2020 9  % Final  . Monocytes Absolute 05/16/2020 0.7  0.1 - 1.0 K/uL Final  . Eosinophils Relative 05/16/2020 1  % Final  . Eosinophils Absolute 05/16/2020 0.1  0.0 - 0.5 K/uL Final  . Basophils Relative 05/16/2020 0  % Final  . Basophils Absolute 05/16/2020 0.0  0.0 - 0.1 K/uL Final  . Immature Granulocytes 05/16/2020 1  % Final  . Abs Immature Granulocytes 05/16/2020 0.07  0.00 - 0.07 K/uL Final   Performed at [facility], [place] [address] [place], [place] [address]  . Sodium 05/16/2020 138  135 - 145 mmol/L Final  . Potassium 05/16/2020 3.9  3.5 - 5.1 mmol/L Final  . Chloride 05/16/2020 105  98 - 111 mmol/L Final  . CO2 05/16/2020 24  22 - 32 mmol/L Final  . Glucose, Bld 05/16/2020 92  70 - 99 mg/dL Final   Glucose reference range applies only to samples taken after fasting for  at least 8 hours.  . BUN 05/16/2020 15  6 - 20 mg/dL Final  . Creatinine, Ser 05/16/2020 0.78  0.44 - 1.00 mg/dL Final  . Calcium 05/16/2020 9.3  8.9 - 10.3 mg/dL Final  . Total Protein 05/16/2020 7.1  6.5 - 8.1 g/dL Final  . Albumin 05/16/2020 3.8  3.5 - 5.0 g/dL Final  . AST 05/16/2020 22  15 - 41 U/L Final  . ALT 05/16/2020 15  0 - 44 U/L Final  . Alkaline Phosphatase 05/16/2020 82  38 - 126 U/L Final  . Total Bilirubin 05/16/2020 0.5  0.3 - 1.2 mg/dL Final  . GFR, Estimated 05/16/2020 >60  >60 mL/min Final   Comment: (NOTE) Calculated using the CKD-EPI Creatinine Equation (2021)   . Anion gap 05/16/2020 9  5 - 15 Final   Performed at [facility], [place] [address] [place], [place] [address]  . Valproic Acid Lvl 05/16/2020 93  50.0 - 100.0 ug/mL Final   Performed at [facility], [place] [address] [place], [place] [address]  Admission on 03/29/2020, Discharged on 03/30/2020  Component Date Value Ref Range Status  . Sodium 03/29/2020 135  135 - 145 mmol/L Final  . Potassium 03/29/2020 3.7  3.5 - 5.1 mmol/L Final  . Chloride 03/29/2020 101  98 - 111 mmol/L Final  . CO2 03/29/2020 24  22 - 32 mmol/L Final  . Glucose, Bld 03/29/2020 117* 70 - 99 mg/dL Final   Glucose reference range applies only to samples taken after fasting for at least 8 hours.  . BUN 03/29/2020 12  6 - 20 mg/dL Final  . Creatinine, Ser 03/29/2020 0.82  0.44 - 1.00 mg/dL Final  . Calcium 03/29/2020 9.2  8.9 - 10.3 mg/dL Final  . Total Protein 03/29/2020 7.7  6.5 - 8.1 g/dL Final  . Albumin 03/29/2020 4.0  3.5 - 5.0 g/dL Final  . AST 03/29/2020 21  15 - 41 U/L Final  . ALT 03/29/2020 16  0 - 44 U/L Final  . Alkaline Phosphatase 03/29/2020 76  38 - 126 U/L Final  . Total Bilirubin 03/29/2020 0.5  0.3 - 1.2 mg/dL Final  . GFR  calc non Af Amer 03/29/2020 >60  >60 mL/min Final  . GFR calc Af Amer 03/29/2020 >60  >60 mL/min Final  . Anion gap 03/29/2020 10  5 - 15 Final   Performed  at [facility], [address] [place], [place] [address]  . Alcohol, Ethyl (B) 03/29/2020 <10  <10 mg/dL Final   Comment: (NOTE) Lowest detectable limit for serum alcohol is 10 mg/dL.  For medical purposes only. Performed at [facility], [address] [place], [place] [address]   . Opiates 03/29/2020 NONE DETECTED  NONE DETECTED Final  . Cocaine 03/29/2020 POSITIVE* NONE DETECTED Final  . Benzodiazepines 03/29/2020 NONE DETECTED  NONE DETECTED Final  . Amphetamines 03/29/2020 NONE DETECTED  NONE DETECTED Final  . Tetrahydrocannabinol 03/29/2020 POSITIVE* NONE DETECTED Final  . Barbiturates 03/29/2020 NONE DETECTED  NONE DETECTED Final   Comment: (NOTE) DRUG SCREEN FOR MEDICAL PURPOSES ONLY.  IF CONFIRMATION IS NEEDED FOR ANY PURPOSE, NOTIFY LAB WITHIN 5 DAYS.  LOWEST DETECTABLE LIMITS FOR URINE DRUG SCREEN Drug Class                     Cutoff (ng/mL) Amphetamine and metabolites    1000 Barbiturate and metabolites    200 Benzodiazepine                 269 Tricyclics and metabolites     300 Opiates and metabolites        300 Cocaine and metabolites        300 THC                            50 Performed at [facility], [address] [place], [place] [address]   . WBC 03/29/2020 8.4  4.0 - 10.5 K/uL Final  . RBC 03/29/2020 4.81  3.87 - 5.11 MIL/uL Final  . Hemoglobin 03/29/2020 13.7  12.0 - 15.0 g/dL Final  . HCT 03/29/2020 43.0  36 - 46 % Final  . MCV 03/29/2020 89.4  80.0 - 100.0 fL Final  . MCH 03/29/2020 28.5  26.0 - 34.0 pg Final  . MCHC 03/29/2020 31.9  30.0 - 36.0 g/dL Final  . RDW 03/29/2020 14.7  11.5 - 15.5 % Final  . Platelets 03/29/2020 264  150 - 400 K/uL Final  . nRBC 03/29/2020 0.0  0.0 - 0.2 % Final  . Neutrophils Relative % 03/29/2020 60  % Final  . Neutro Abs 03/29/2020 5.1  1.7 - 7.7 K/uL Final  . Lymphocytes Relative 03/29/2020 28  % Final  . Lymphs Abs 03/29/2020 2.3  0.7 - 4.0 K/uL Final  . Monocytes Relative 03/29/2020 9  % Final  . Monocytes  Absolute 03/29/2020 0.8  0.1 - 1.0 K/uL Final  . Eosinophils Relative 03/29/2020 1  % Final  . Eosinophils Absolute 03/29/2020 0.1  0.0 - 0.5 K/uL Final  . Basophils Relative 03/29/2020 1  % Final  . Basophils Absolute 03/29/2020 0.0  0.0 - 0.1 K/uL Final  . Immature Granulocytes 03/29/2020 1  % Final  . Abs Immature Granulocytes 03/29/2020 0.09* 0.00 - 0.07 K/uL Final   Performed at [facility], [address], [place], [place] [address]  . Preg Test, Ur 03/29/2020 NEGATIVE  NEGATIVE Final   Comment:        THE SENSITIVITY OF THIS METHODOLOGY IS >24 mIU/mL   . SARS Coronavirus 2 03/29/2020 NEGATIVE  NEGATIVE Final   Comment: (NOTE) SARS-CoV-2 target nucleic acids  are NOT DETECTED.  The SARS-CoV-2 RNA is generally detectable in upper and lower respiratory specimens during the acute phase of infection. The lowest concentration of SARS-CoV-2 viral copies this assay can detect is 250 copies / mL. A negative result does not preclude SARS-CoV-2 infection and should not be used as the sole basis for treatment or other patient management decisions.  A negative result may occur with improper specimen collection / handling, submission of specimen other than nasopharyngeal swab, presence of viral mutation(s) within the areas targeted by this assay, and inadequate number of viral copies (<250 copies / mL). A negative result must be combined with clinical observations, patient history, and epidemiological information.  Fact Sheet for Patients:   StrictlyIdeas.no  Fact Sheet for Healthcare Providers: BankingDealers.co.za  This test is not yet approved or                           cleared by the [place] FDA and has been authorized for detection and/or diagnosis of SARS-CoV-2 by FDA under an Emergency Use Authorization (EUA).  This EUA will remain in effect (meaning this test can be used) for the duration of the COVID-19 declaration under  Section 564(b)(1) of the Act, 21 U.S.C. section 360bbb-3(b)(1), unless the authorization is terminated or revoked sooner.  Performed at [facility], [address] [place], [place] [address]   . Acetaminophen (Tylenol), Serum 03/29/2020 <10* 10 - 30 ug/mL Final   Comment: (NOTE) Therapeutic concentrations vary significantly. A range of 10-30 ug/mL  may be an effective concentration for many patients. However, some  are best treated at concentrations outside of this range. Acetaminophen concentrations >150 ug/mL at 4 hours after ingestion  and >50 ug/mL at 12 hours after ingestion are often associated with  toxic reactions.  Performed at [facility], [address] [place], [place] [address]   . Salicylate Lvl 09/81/1914 <7.0* 7.0 - 30.0 mg/dL Final   Performed at [facility], [address], [place], [place] [address]  . Color, Urine 03/29/2020 STRAW* YELLOW Final  . APPearance 03/29/2020 CLEAR  CLEAR Final  . Specific Gravity, Urine 03/29/2020 1.006  1.005 - 1.030 Final  . pH 03/29/2020 7.0  5.0 - 8.0 Final  . Glucose, UA 03/29/2020 NEGATIVE  NEGATIVE mg/dL Final  . Hgb urine dipstick 03/29/2020 NEGATIVE  NEGATIVE Final  . Bilirubin Urine 03/29/2020 NEGATIVE  NEGATIVE Final  . Ketones, ur 03/29/2020 NEGATIVE  NEGATIVE mg/dL Final  . Protein, ur 03/29/2020 NEGATIVE  NEGATIVE mg/dL Final  . Nitrite 03/29/2020 NEGATIVE  NEGATIVE Final  . Chalmers Guest 03/29/2020 NEGATIVE  NEGATIVE Final   Performed at [facility], [address], [place], [place] [address]  Admission on 03/25/2020, Discharged on 03/27/2020  Component Date Value Ref Range Status  . SARS Coronavirus 2 03/25/2020 NEGATIVE  NEGATIVE Final   Comment: (NOTE) SARS-CoV-2 target nucleic acids are NOT DETECTED.  The SARS-CoV-2 RNA is generally detectable in upper and lower respiratory specimens during the acute phase of infection. The lowest concentration of SARS-CoV-2 viral copies this assay can detect is 250 copies /  mL. A negative result does not preclude SARS-CoV-2 infection and should not be used as the sole basis for treatment or other patient management decisions.  A negative result may occur with improper specimen collection / handling, submission of specimen other than nasopharyngeal swab, presence of viral mutation(s) within the areas targeted by this assay, and inadequate number of viral copies (<250 copies / mL).  A negative result must be combined with clinical observations, patient history, and epidemiological information.  Fact Sheet for Patients:   StrictlyIdeas.no  Fact Sheet for Healthcare Providers: BankingDealers.co.za  This test is not yet approved or                           cleared by the [place] FDA and has been authorized for detection and/or diagnosis of SARS-CoV-2 by FDA under an Emergency Use Authorization (EUA).  This EUA will remain in effect (meaning this test can be used) for the duration of the COVID-19 declaration under Section 564(b)(1) of the Act, 21 U.S.C. section 360bbb-3(b)(1), unless the authorization is terminated or revoked sooner.  Performed at [facility], [place] [address], [place], [place] [address]   . Opiates 03/25/2020 NONE DETECTED  NONE DETECTED Final  . Cocaine 03/25/2020 NONE DETECTED  NONE DETECTED Final  . Benzodiazepines 03/25/2020 NONE DETECTED  NONE DETECTED Final  . Amphetamines 03/25/2020 NONE DETECTED  NONE DETECTED Final  . Tetrahydrocannabinol 03/25/2020 POSITIVE* NONE DETECTED Final  . Barbiturates 03/25/2020 NONE DETECTED  NONE DETECTED Final   Comment: (NOTE) DRUG SCREEN FOR MEDICAL PURPOSES ONLY.  IF CONFIRMATION IS NEEDED FOR ANY PURPOSE, NOTIFY LAB WITHIN 5 DAYS.  LOWEST DETECTABLE LIMITS FOR URINE DRUG SCREEN Drug Class                     Cutoff (ng/mL) Amphetamine and metabolites    1000 Barbiturate and metabolites    200 Benzodiazepine                  725 Tricyclics and metabolites     300 Opiates and metabolites        300 Cocaine and metabolites        300 THC                            50 Performed at [facility], [place] [address], [place], [place] [address]   . I-stat hCG, quantitative 03/25/2020 <5.0  <5 mIU/mL Final  . Comment 3 03/25/2020          Final   Comment:   GEST. AGE      CONC.  (mIU/mL)   <=1 WEEK        5 - 50     2 WEEKS       50 - 500     3 WEEKS       100 - 10,000     4 WEEKS     1,000 - 30,000        FEMALE AND NON-PREGNANT FEMALE:     LESS THAN 5 mIU/mL   . WBC 03/25/2020 7.1  4.0 - 10.5 K/uL Final  . RBC 03/25/2020 4.79  3.87 - 5.11 MIL/uL Final  . Hemoglobin 03/25/2020 13.9  12.0 - 15.0 g/dL Final  . HCT 03/25/2020 43.5  36 - 46 % Final  . MCV 03/25/2020 90.8  80.0 - 100.0 fL Final  . MCH 03/25/2020 29.0  26.0 - 34.0 pg Final  . MCHC 03/25/2020 32.0  30.0 - 36.0 g/dL Final  . RDW 03/25/2020 15.2  11.5 - 15.5 % Final  . Platelets 03/25/2020 253  150 - 400 K/uL Final  . nRBC 03/25/2020 0.0  0.0 - 0.2 % Final   Performed at [facility], [place] [address] [place],  [place] [address]  . Sodium 03/25/2020 140  135 - 145 mmol/L Final  . Potassium 03/25/2020 3.7  3.5 - 5.1 mmol/L Final  . Chloride 03/25/2020 106  98 - 111 mmol/L Final  . CO2 03/25/2020 22  22 - 32 mmol/L Final  . Glucose, Bld 03/25/2020 99  70 - 99 mg/dL Final   Glucose reference range applies only to samples taken after fasting for at least 8 hours.  . BUN 03/25/2020 11  6 - 20 mg/dL Final  . Creatinine, Ser 03/25/2020 0.79  0.44 - 1.00 mg/dL Final  . Calcium 03/25/2020 9.0  8.9 - 10.3 mg/dL Final  . Total Protein 03/25/2020 7.1  6.5 - 8.1 g/dL Final  . Albumin 03/25/2020 4.0  3.5 - 5.0 g/dL Final  . AST 03/25/2020 19  15 - 41 U/L Final  . ALT 03/25/2020 16  0 - 44 U/L Final  . Alkaline Phosphatase 03/25/2020 77  38 - 126 U/L Final  . Total Bilirubin 03/25/2020 0.4  0.3 - 1.2 mg/dL Final  . GFR calc non Af Amer  03/25/2020 >60  >60 mL/min Final  . GFR calc Af Amer 03/25/2020 >60  >60 mL/min Final  . Anion gap 03/25/2020 12  5 - 15 Final   Performed at [place] [address] [place], [place] [address]  . Valproic Acid Lvl 03/27/2020 59  50.0 - 100.0 ug/mL Final   Performed at [facility], [place] [address], [place], [place] [address]  Admission on 03/16/2020, Discharged on 03/22/2020  Component Date Value Ref Range Status  . WBC 03/17/2020 8.2  4.0 - 10.5 K/uL Final  . RBC 03/17/2020 4.61  3.87 - 5.11 MIL/uL Final  . Hemoglobin 03/17/2020 13.3  12.0 - 15.0 g/dL Final  . HCT 03/17/2020 41.9  36 - 46 % Final  . MCV 03/17/2020 90.9  80.0 - 100.0 fL Final  . MCH 03/17/2020 28.9  26.0 - 34.0 pg Final  . MCHC 03/17/2020 31.7  30.0 - 36.0 g/dL Final  . RDW 03/17/2020 15.5  11.5 - 15.5 % Final  . Platelets 03/17/2020 267  150 - 400 K/uL Final  . nRBC 03/17/2020 0.0  0.0 - 0.2 % Final  . Neutrophils Relative % 03/17/2020 53  % Final  . Neutro Abs 03/17/2020 4.4  1.7 - 7.7 K/uL Final  . Lymphocytes Relative 03/17/2020 35  % Final  . Lymphs Abs 03/17/2020 2.9  0.7 - 4.0 K/uL Final  . Monocytes Relative 03/17/2020 9  % Final  . Monocytes Absolute 03/17/2020 0.7  0.1 - 1.0 K/uL Final  . Eosinophils Relative 03/17/2020 1  % Final  . Eosinophils Absolute 03/17/2020 0.1  0.0 - 0.5 K/uL Final  . Basophils Relative 03/17/2020 1  % Final  . Basophils Absolute 03/17/2020 0.0  0.0 - 0.1 K/uL Final  . nRBC 03/17/2020 HIDE  0 /100 WBC Final  . Immature Granulocytes 03/17/2020 1  % Final  . Abs Immature Granulocytes 03/17/2020 0.06  0.00 - 0.07 K/uL Final   Performed at [facility], [place] [address], [place], [place] [address]  . Sodium 03/17/2020 138  135 - 145 mmol/L Final  . Potassium 03/17/2020 3.8  3.5 - 5.1 mmol/L Final  . Chloride 03/17/2020 104  98 - 111 mmol/L Final  . CO2 03/17/2020 24  22 - 32 mmol/L Final  . Glucose, Bld 03/17/2020 96  70 - 99 mg/dL  Final   Glucose reference range applies only to  samples taken after fasting for at least 8 hours.  . BUN 03/17/2020 15  6 - 20 mg/dL Final  . Creatinine, Ser 03/17/2020 0.84  0.44 - 1.00 mg/dL Final  . Calcium 03/17/2020 9.1  8.9 - 10.3 mg/dL Final  . Total Protein 03/17/2020 7.3  6.5 - 8.1 g/dL Final  . Albumin 03/17/2020 4.0  3.5 - 5.0 g/dL Final  . AST 03/17/2020 18  15 - 41 U/L Final  . ALT 03/17/2020 16  0 - 44 U/L Final  . Alkaline Phosphatase 03/17/2020 83  38 - 126 U/L Final  . Total Bilirubin 03/17/2020 0.1* 0.3 - 1.2 mg/dL Final  . GFR calc non Af Amer 03/17/2020 >60  >60 mL/min Final  . GFR calc Af Amer 03/17/2020 >60  >60 mL/min Final  . Anion gap 03/17/2020 10  5 - 15 Final   Performed at [facility], [place] [address] [place], [place] [address]  . Hgb A1c MFr Bld 03/17/2020 5.5  4.8 - 5.6 % Final   Comment: (NOTE) Pre diabetes:          5.7%-6.4%  Diabetes:              >6.4%  Glycemic control for   <7.0% adults with diabetes   . Mean Plasma Glucose 03/17/2020 111.15  mg/dL Final   Performed at [place] [address], [place], [place] [address]  . Preg, Serum 03/17/2020 NEGATIVE  NEGATIVE Final   Comment:        THE SENSITIVITY OF THIS METHODOLOGY IS >10 mIU/mL. Performed at [facility], [place] [address] [place], [place] [address]   . Color, Urine 03/20/2020 STRAW* YELLOW Final  . APPearance 03/20/2020 CLEAR  CLEAR Final  . Specific Gravity, Urine 03/20/2020 1.008  1.005 - 1.030 Final  . pH 03/20/2020 7.0  5.0 - 8.0 Final  . Glucose, UA 03/20/2020 NEGATIVE  NEGATIVE mg/dL Final  . Hgb urine dipstick 03/20/2020 NEGATIVE  NEGATIVE Final  . Bilirubin Urine 03/20/2020 NEGATIVE  NEGATIVE Final  . Ketones, ur 03/20/2020 NEGATIVE  NEGATIVE mg/dL Final  . Protein, ur 03/20/2020 NEGATIVE  NEGATIVE mg/dL Final  . Nitrite 03/20/2020 NEGATIVE  NEGATIVE Final  . Chalmers Guest 03/20/2020 NEGATIVE  NEGATIVE Final  . RBC / HPF  03/20/2020 0-5  0 - 5 RBC/hpf Final  . WBC, UA 03/20/2020 0-5  0 - 5 WBC/hpf Final  . Bacteria, UA 03/20/2020 RARE* NONE SEEN Final  . Squamous Epithelial / LPF 03/20/2020 0-5  0 - 5 Final   Performed at [facility], [place] [address], [place], [place] [address]  . Opiates 03/20/2020 NONE DETECTED  NONE DETECTED Final  . Cocaine 03/20/2020 NONE DETECTED  NONE DETECTED Final  . Benzodiazepines 03/20/2020 NONE DETECTED  NONE DETECTED Final  . Amphetamines 03/20/2020 NONE DETECTED  NONE DETECTED Final  . Tetrahydrocannabinol 03/20/2020 POSITIVE* NONE DETECTED Final  . Barbiturates 03/20/2020 NONE DETECTED  NONE DETECTED Final   Comment: (NOTE) DRUG SCREEN FOR MEDICAL PURPOSES ONLY.  IF CONFIRMATION IS NEEDED FOR ANY PURPOSE, NOTIFY LAB WITHIN 5 DAYS.  LOWEST DETECTABLE LIMITS FOR URINE DRUG SCREEN Drug Class                     Cutoff (ng/mL) Amphetamine and metabolites    1000 Barbiturate and metabolites    200 Benzodiazepine                 643 Tricyclics and metabolites  300 Opiates and metabolites        300 Cocaine and metabolites        300 THC                            50 Performed at [place] [address], [place], [place] [address]   . TSH 03/21/2020 0.876  0.350 - 4.500 uIU/mL Final   Comment: Performed by a 3rd Generation assay with a functional sensitivity of <=0.01 uIU/mL. Performed at [facility], [place] [address] [place], [place] [address]   Admission on 03/16/2020, Discharged on 03/16/2020  Component Date Value Ref Range Status  . SARS Coronavirus 2 03/16/2020 NEGATIVE  NEGATIVE Final   Comment: (NOTE) SARS-CoV-2 target nucleic acids are NOT DETECTED.  The SARS-CoV-2 RNA is generally detectable in upper and lower respiratory specimens during the acute phase of infection. The lowest concentration of SARS-CoV-2 viral copies this assay can detect is 250 copies / mL. A negative result does not  preclude SARS-CoV-2 infection and should not be used as the sole basis for treatment or other patient management decisions.  A negative result may occur with improper specimen collection / handling, submission of specimen other than nasopharyngeal swab, presence of viral mutation(s) within the areas targeted by this assay, and inadequate number of viral copies (<250 copies / mL). A negative result must be combined with clinical observations, patient history, and epidemiological information.  Fact Sheet for Patients:   StrictlyIdeas.no  Fact Sheet for Healthcare Providers: BankingDealers.co.za  This test is not yet approved or                           cleared by the [place] FDA and has been authorized for detection and/or diagnosis of SARS-CoV-2 by FDA under an Emergency Use Authorization (EUA).  This EUA will remain in effect (meaning this test can be used) for the duration of the COVID-19 declaration under Section 564(b)(1) of the Act, 21 U.S.C. section 360bbb-3(b)(1), unless the authorization is terminated or revoked sooner.  Performed at [facility], [place] [address], [place], [place] [address]   . SARS Coronavirus 2 Ag 03/16/2020 NEGATIVE  NEGATIVE Final   Comment: (NOTE) SARS-CoV-2 antigen NOT DETECTED.   Negative results are presumptive.  Negative results do not preclude SARS-CoV-2 infection and should not be used as the sole basis for treatment or other patient management decisions, including infection  control decisions, particularly in the presence of clinical signs and  symptoms consistent with COVID-19, or in those who have been in contact with the virus.  Negative results must be combined with clinical observations, patient history, and epidemiological information. The expected result is Negative.  Fact Sheet for Patients: PodPark.tn  Fact Sheet for Healthcare  Providers: GiftContent.is   This test is not yet approved or cleared by the [place] FDA and  has been authorized for detection and/or diagnosis of SARS-CoV-2 by FDA under an Emergency Use Authorization (EUA).  This EUA will remain in effect (meaning this test can be used) for the duration of  the C                          OVID-19 declaration under Section 564(b)(1) of the Act, 21 U.S.C. section 360bbb-3(b)(1), unless the authorization is terminated or revoked sooner.    Procedure visit on 01/19/2020  Component Date Value Ref Range Status  . Bacterial Vaginitis (gardnerella) 01/19/2020 Positive*  Final  . Candida Vaginitis 01/19/2020 Negative   Final  . Candida Glabrata 01/19/2020 Negative   Final  . Trichomonas 01/19/2020 Negative   Final  . Chlamydia 01/19/2020 Negative   Final  . Neisseria Gonorrhea 01/19/2020 Negative   Final  . Comment 01/19/2020 Normal Reference Range Bacterial Vaginosis - Negative   Final  . Comment 01/19/2020 Normal Reference Range Candida Species - Negative   Final  . Comment 01/19/2020 Normal Reference Range Candida Galbrata - Negative   Final  . Comment 01/19/2020 Normal Reference Range Trichomonas - Negative   Final  . Comment 01/19/2020 Normal Reference [place] Chlamydia - Negative   Final  . Comment 01/19/2020 Normal Reference Range Neisseria Gonorrhea - Negative   Final  Admission on 10/15/2019, Discharged on 11/26/2019  Component Date Value Ref Range Status  . SARS Coronavirus 2 by RT PCR 10/15/2019 NEGATIVE  NEGATIVE Final   Comment: (NOTE) SARS-CoV-2 target nucleic acids are NOT DETECTED. The SARS-CoV-2 RNA is generally detectable in upper respiratoy specimens during the acute phase of infection. The lowest concentration of SARS-CoV-2 viral copies this assay can detect is 131 copies/mL. A negative result does not preclude SARS-Cov-2 infection and should not be used as the sole basis for treatment or other patient  management decisions. A negative result may occur with  improper specimen collection/handling, submission of specimen other than nasopharyngeal swab, presence of viral mutation(s) within the areas targeted by this assay, and inadequate number of viral copies (<131 copies/mL). A negative result must be combined with clinical observations, patient history, and epidemiological information. The expected result is Negative. Fact Sheet for Patients:  PinkCheek.be Fact Sheet for Healthcare Providers:  GravelBags.it This test is not yet ap                          proved or cleared by the [place] FDA and  has been authorized for detection and/or diagnosis of SARS-CoV-2 by FDA under an Emergency Use Authorization (EUA). This EUA will remain  in effect (meaning this test can be used) for the duration of the COVID-19 declaration under Section 564(b)(1) of the Act, 21 U.S.C. section 360bbb-3(b)(1), unless the authorization is terminated or revoked sooner.   . Influenza A by PCR 10/15/2019 NEGATIVE  NEGATIVE Final  . Influenza B by PCR 10/15/2019 NEGATIVE  NEGATIVE Final   Comment: (NOTE) The Xpert Xpress SARS-CoV-2/FLU/RSV assay is intended as an aid in  the diagnosis of influenza from Nasopharyngeal swab specimens and  should not be used as a sole basis for treatment. Nasal washings and  aspirates are unacceptable for Xpert Xpress SARS-CoV-2/FLU/RSV  testing. Fact Sheet for Patients: PinkCheek.be Fact Sheet for Healthcare Providers: GravelBags.it This test is not yet approved or cleared by the [place] FDA and  has been authorized for detection and/or diagnosis of SARS-CoV-2 by  FDA under an Emergency Use Authorization (EUA). This EUA will remain  in effect (meaning this test can be used) for the duration of the  Covid-19 declaration under Section 564(b)(1) of the Act,  21  U.S.C. section 360bbb-3(b)(1), unless the authorization is  terminated or revoked. Performed at [facility], [place] [address], [place],  [address]   . WBC 10/16/2019 6.5  4.0 - 10.5 K/uL Final  . RBC 10/16/2019 4.70  3.87 - 5.11 MIL/uL Final  . Hemoglobin 10/16/2019 13.4  12.0 - 15.0 g/dL  Final  . HCT 10/16/2019 42.6  36 - 46 % Final  . MCV 10/16/2019 90.6  80.0 - 100.0 fL Final  . MCH 10/16/2019 28.5  26.0 - 34.0 pg Final  . MCHC 10/16/2019 31.5  30.0 - 36.0 g/dL Final  . RDW 10/16/2019 13.2  11.5 - 15.5 % Final  . Platelets 10/16/2019 230  150 - 400 K/uL Final  . nRBC 10/16/2019 0.0  0.0 - 0.2 % Final   Performed at [facility], [place] [address], [place], [place] [address]  . Sodium 10/16/2019 140  135 - 145 mmol/L Final  . Potassium 10/16/2019 4.1  3.5 - 5.1 mmol/L Final  . Chloride 10/16/2019 106  98 - 111 mmol/L Final  . CO2 10/16/2019 25  22 - 32 mmol/L Final  . Glucose, Bld 10/16/2019 104* 70 - 99 mg/dL Final   Glucose reference range applies only to samples taken after fasting for at least 8 hours.  . BUN 10/16/2019 10  6 - 20 mg/dL Final  . Creatinine, Ser 10/16/2019 0.90  0.44 - 1.00 mg/dL Final  . Calcium 10/16/2019 9.5  8.9 - 10.3 mg/dL Final  . Total Protein 10/16/2019 7.6  6.5 - 8.1 g/dL Final  . Albumin 10/16/2019 4.4  3.5 - 5.0 g/dL Final  . AST 10/16/2019 23  15 - 41 U/L Final  . ALT 10/16/2019 16  0 - 44 U/L Final  . Alkaline Phosphatase 10/16/2019 67  38 - 126 U/L Final  . Total Bilirubin 10/16/2019 0.5  0.3 - 1.2 mg/dL Final  . GFR calc non Af Amer 10/16/2019 >60  >60 mL/min Final  . GFR calc Af Amer 10/16/2019 >60  >60 mL/min Final  . Anion gap 10/16/2019 9  5 - 15 Final   Performed at [facility], [place] [address], [place], [place] [address]  . Hgb A1c MFr Bld 10/16/2019 5.4  4.8 - 5.6 % Final   Comment: (NOTE) Pre diabetes:          5.7%-6.4% Diabetes:              >6.4% Glycemic control  for   <7.0% adults with diabetes   . Mean Plasma Glucose 10/16/2019 108.28  mg/dL Final   Performed at [place] [address], [place], [place] [address]  . Magnesium 10/16/2019 2.0  1.7 - 2.4 mg/dL Final   Performed at [place] [address], [place], [place] [address]  . Alcohol, Ethyl (B) 10/16/2019 <10  <10 mg/dL Final   Comment: (NOTE) Lowest detectable limit for serum alcohol is 10 mg/dL. For medical purposes only. Performed at [facility], [place] [address], [place], [place] [address]   . Cholesterol 10/16/2019 138  0 - 200 mg/dL Final  . Triglycerides 10/16/2019 60  <150 mg/dL Final  . HDL 10/16/2019 36* >40 mg/dL Final  . Total CHOL/HDL Ratio 10/16/2019 3.8  RATIO Final  . VLDL 10/16/2019 12  0 - 40 mg/dL Final  . LDL Cholesterol 10/16/2019 90  0 - 99 mg/dL Final   Comment:        Total Cholesterol/HDL:CHD Risk Coronary Heart Disease Risk Table                     Men   Women  1/2 Average Risk   3.4   3.3  Average Risk       5.0   4.4  2 X Average Risk  9.6   7.1  3 X Average Risk  23.4   11.0        Use the calculated Patient Ratio above and the CHD Risk Table to determine the patient's CHD Risk.        ATP III CLASSIFICATION (LDL):  <100     mg/dL   Optimal  100-129  mg/dL   Near or Above                    Optimal  130-159  mg/dL   Borderline  160-189  mg/dL   High  >190     mg/dL   Very High Performed at [place] [address] [place], [place] [address]   . Total Protein 10/16/2019 7.6  6.5 - 8.1 g/dL Final  . Albumin 10/16/2019 4.4  3.5 - 5.0 g/dL Final  . AST 10/16/2019 23  15 - 41 U/L Final  . ALT 10/16/2019 16  0 - 44 U/L Final  . Alkaline Phosphatase 10/16/2019 67  38 - 126 U/L Final  . Total Bilirubin 10/16/2019 0.5  0.3 - 1.2 mg/dL Final  . Bilirubin, Direct 10/16/2019 0.1  0.0 - 0.2 mg/dL Final  . Indirect Bilirubin 10/16/2019 0.4  0.3 - 0.9 mg/dL Final   Performed  at [facility], [place] [address] [place], [place] [address]  . Prolactin 10/16/2019 152.0* 4.8 - 23.3 ng/mL Final   Comment: (NOTE) Performed At: [facility] [place], [place] [id number] Rush Farmer MD OX:[id number]   . Color, Urine 10/16/2019 YELLOW  YELLOW Final  . APPearance 10/16/2019 HAZY* CLEAR Final  . Specific Gravity, Urine 10/16/2019 1.010  1.005 - 1.030 Final  . pH 10/16/2019 6.0  5.0 - 8.0 Final  . Glucose, UA 10/16/2019 NEGATIVE  NEGATIVE mg/dL Final  . Hgb urine dipstick 10/16/2019 SMALL* NEGATIVE Final  . Bilirubin Urine 10/16/2019 NEGATIVE  NEGATIVE Final  . Ketones, ur 10/16/2019 5* NEGATIVE mg/dL Final  . Protein, ur 10/16/2019 NEGATIVE  NEGATIVE mg/dL Final  . Nitrite 10/16/2019 NEGATIVE  NEGATIVE Final  . Leukocytes,Ua 10/16/2019 LARGE* NEGATIVE Final  . RBC / HPF 10/16/2019 11-20  0 - 5 RBC/hpf Final  . WBC, UA 10/16/2019 >50* 0 - 5 WBC/hpf Final  . Bacteria, UA 10/16/2019 RARE* NONE SEEN Final  . Squamous Epithelial / LPF 10/16/2019 0-5  0 - 5 Final  . Mucus 10/16/2019 PRESENT   Final  . Ca Oxalate Crys, UA 10/16/2019 PRESENT   Final   Performed at [facility], [place] [address], [place], [place] [address]  . Preg Test, Ur 10/16/2019 NEGATIVE  NEGATIVE Final   Comment:        THE SENSITIVITY OF THIS METHODOLOGY IS >20 mIU/mL. Performed at [facility], [place] [address], [place], [place] [address]   . Specimen Description 10/23/2019    Final                   Value:URINE, RANDOM Performed at [facility], [place] [address], [place], [place] [address]   . Special Requests 10/23/2019    Final                   Value:NONE Performed at [facility], [place] [address] [place], [place] [address]   . Culture 10/23/2019 >=100,000 COLONIES/mL STAPHYLOCOCCUS EPIDERMIDIS*  Final  . Report Status 10/23/2019 10/26/2019 FINAL   Final  . Organism ID, Bacteria  10/23/2019  STAPHYLOCOCCUS EPIDERMIDIS*  Final  . Color, Urine 11/02/2019 YELLOW  YELLOW Final  . APPearance 11/02/2019 CLOUDY* CLEAR Final  . Specific Gravity, Urine 11/02/2019 1.015  1.005 - 1.030 Final  . pH 11/02/2019 7.0  5.0 - 8.0 Final  . Glucose, UA 11/02/2019 NEGATIVE  NEGATIVE mg/dL Final  . Hgb urine dipstick 11/02/2019 NEGATIVE  NEGATIVE Final  . Bilirubin Urine 11/02/2019 NEGATIVE  NEGATIVE Final  . Ketones, ur 11/02/2019 NEGATIVE  NEGATIVE mg/dL Final  . Protein, ur 11/02/2019 NEGATIVE  NEGATIVE mg/dL Final  . Nitrite 11/02/2019 NEGATIVE  NEGATIVE Final  . Chalmers Guest 11/02/2019 NEGATIVE  NEGATIVE Final  . RBC / HPF 11/02/2019 0-5  0 - 5 RBC/hpf Final  . WBC, UA 11/02/2019 0-5  0 - 5 WBC/hpf Final  . Bacteria, UA 11/02/2019 NONE SEEN  NONE SEEN Final  . Squamous Epithelial / LPF 11/02/2019 6-10  0 - 5 Final  . Mucus 11/02/2019 PRESENT   Final  . Amorphous Crystal 11/02/2019 PRESENT   Final   Performed at [facility], [place] [address] [place], [place] [address]  . Prolactin 11/11/2019 113.0* 4.8 - 23.3 ng/mL Final   Comment: (NOTE) Performed At: [facility] [place], [place] [id number] Rush Farmer MD JJ:[id number]   . SARS Coronavirus 2 by RT PCR 11/11/2019 NEGATIVE  NEGATIVE Final   Comment: (NOTE) SARS-CoV-2 target nucleic acids are NOT DETECTED. The SARS-CoV-2 RNA is generally detectable in upper respiratoy specimens during the acute phase of infection. The lowest concentration of SARS-CoV-2 viral copies this assay can detect is 131 copies/mL. A negative result does not preclude SARS-Cov-2 infection and should not be used as the sole basis for treatment or other patient management decisions. A negative result may occur with  improper specimen collection/handling, submission of specimen other than nasopharyngeal swab, presence of viral mutation(s) within the areas targeted by this assay, and inadequate number of viral  copies (<131 copies/mL). A negative result must be combined with clinical observations, patient history, and epidemiological information. The expected result is Negative. Fact Sheet for Patients:  PinkCheek.be Fact Sheet for Healthcare Providers:  GravelBags.it This test is not yet ap                          proved or cleared by the [place] FDA and  has been authorized for detection and/or diagnosis of SARS-CoV-2 by FDA under an Emergency Use Authorization (EUA). This EUA will remain  in effect (meaning this test can be used) for the duration of the COVID-19 declaration under Section 564(b)(1) of the Act, 21 U.S.C. section 360bbb-3(b)(1), unless the authorization is terminated or revoked sooner.   . Influenza A by PCR 11/11/2019 NEGATIVE  NEGATIVE Final  . Influenza B by PCR 11/11/2019 NEGATIVE  NEGATIVE Final   Comment: (NOTE) The Xpert Xpress SARS-CoV-2/FLU/RSV assay is intended as an aid in  the diagnosis of influenza from Nasopharyngeal swab specimens and  should not be used as a sole basis for treatment. Nasal washings and  aspirates are unacceptable for Xpert Xpress SARS-CoV-2/FLU/RSV  testing. Fact Sheet for Patients: PinkCheek.be Fact Sheet for Healthcare Providers: GravelBags.it This test is not yet approved or cleared by the [place] FDA and  has been authorized for detection and/or diagnosis of SARS-CoV-2 by  FDA under an Emergency Use Authorization (EUA). This EUA will remain  in effect (meaning this test can be used) for the duration of the  Covid-19 declaration under Section  564(b)(1) of the Act, 21  U.S.C. section 360bbb-3(b)(1), unless the authorization is  terminated or revoked. Performed at [facility], [place] [address] [place], [place] [address]     Blood Alcohol level:  Lab Results  Component Value Date   [facility]  <10 05/20/2020   ETH <10 09/73/5329    Metabolic Disorder Labs: Lab Results  Component Value Date   HGBA1C 5.4 05/09/2020   MPG 108 05/09/2020   MPG 111.15 03/17/2020   Lab Results  Component Value Date   PROLACTIN 142.0 (H) 05/10/2020   PROLACTIN 113.0 (H) 11/11/2019   Lab Results  Component Value Date   CHOL 138 10/16/2019   TRIG 60 10/16/2019   HDL 36 (L) 10/16/2019   CHOLHDL 3.8 10/16/2019   VLDL 12 10/16/2019   LDLCALC 90 10/16/2019   LDLCALC 96 05/17/2019    Therapeutic Lab Levels: No results found for: LITHIUM Lab Results  Component Value Date   VALPROATE 93 05/16/2020   VALPROATE 84 05/09/2020   No components found for:  CBMZ  Physical Findings   AIMS     Admission (Discharged) from OP Visit from 05/08/2020 in [place] [facility] Admission (Discharged) from 03/16/2020 in [place] [facility] Admission (Discharged) from OP Visit from 10/15/2019 in [place] [facility] Admission (Discharged) from 06/23/2019 in [place] Admission (Discharged) from 05/16/2019 in [place] [facility]  AIMS Total Score 0 0 0 0 0    AUDIT     Admission (Discharged) from OP Visit from 05/08/2020 in [place] [facility] Admission (Discharged) from 03/16/2020 in [place] [facility] Admission (Discharged) from OP Visit from 10/15/2019 in [place] [facility] Admission (Discharged) from 07/10/2019 in [place] Admission (Discharged) from 06/23/2019 in [place]  Alcohol Use Disorder Identification Test Final Score (AUDIT) 0 0 0 0 0    PHQ2-9     Office Visit from 07/11/2016 in [facility]  PHQ-2 Total Score 0  PHQ-9 Total Score 0       Musculoskeletal  Strength & Muscle Tone: within normal limits Gait & Station: normal Patient leans:  N/A  Psychiatric Specialty Exam  Presentation  General Appearance: Appropriate for Environment  Eye Contact:Fair  Speech:Clear and Coherent;Normal Rate  Speech Volume:Normal  Handedness:Right   Mood and Affect  Mood:Irritable  Affect:Labile   Thought Process  Thought Processes:Coherent;Goal Directed  Descriptions of Associations:Intact  Orientation:Full (Time, Place and Person)  Thought Content:Delusions  Hallucinations:Hallucinations: None Description of Auditory Hallucinations: [place] Shows, the baby voices speaking to her  Ideas of Reference:Delusions  Suicidal Thoughts:Suicidal Thoughts: No  Homicidal Thoughts:Homicidal Thoughts: No   Sensorium  Memory:Immediate Fair;Recent Fair;Remote Fair  Judgment:Fair  Insight:Lacking   Executive Functions  Concentration:Fair  Attention Span:Fair  [place]   Psychomotor Activity  Psychomotor Activity:Psychomotor Activity: Normal   Assets  Assets:Communication Skills;Desire for Improvement;Social Support   Sleep  Sleep:Sleep: Fair   Physical Exam  Physical Exam Vitals and nursing note reviewed.  Constitutional:      Appearance: She is well-developed.  HENT:     Head: Normocephalic.  Cardiovascular:     Rate and Rhythm: Normal rate.  Pulmonary:     Effort: Pulmonary effort is normal.  Neurological:     Mental Status: She is alert and oriented to person, place, and time.  Psychiatric:  Attention and Perception: Attention and perception normal.        Mood and Affect: Mood normal. Affect is labile.        Speech: Speech normal.        Behavior: Behavior normal. Behavior is cooperative.        Thought Content: Thought content is paranoid and delusional.        Cognition and Memory: Cognition and memory normal.        Judgment: Judgment normal.    Review of Systems  Constitutional: Negative.   HENT: Negative.   Eyes: Negative.   Respiratory:  Negative.   Cardiovascular: Negative.   Gastrointestinal: Negative.   Genitourinary: Negative.   Musculoskeletal: Negative.   Skin: Negative.   Neurological: Negative.   Endo/Heme/Allergies: Negative.   Psychiatric/Behavioral: Positive for substance abuse.   Blood pressure 109/70, pulse (!) 103, temperature 97.8 F (36.6 C), temperature source Tympanic, resp. rate 18, height 5' 6.14" (1.68 m), weight 85.3 kg, SpO2 100 %. Body mass index is 30.21 kg/m.  Treatment Plan Summary: Patient reviewed with Dr. Darleene Cleaver.  Inpatient psychiatric treatment recommended.  Emmaline Kluver, FNP 05/21/2020 12:32 PM

## 2020-05-21 NOTE — ED Notes (Signed)
Pt is currently on the phone doing TTS consult

## 2020-05-21 NOTE — Progress Notes (Signed)
Per [facility] in admissions at [facility], pt has been accepted for admission for Monday, 11/8 anytime after 9:00AM. Accepting provider: Dr. Jonelle Sports. [facility] Number for report: [phone]. CSW notified [name] NP of disposition. Pt is voluntary and will require Safe Transport.   Modupe Shampine S. Ouida Sills, MSW, LCSW Clinical Social Worker 05/21/2020 1:43 PM

## 2020-05-21 NOTE — BH Assessment (Signed)
Tele Assessment Note   Patient Name: Julia Julia [name] MRN: [id number] Referring Physician: Joline Maxcy, PA-C Location of Patient: [facility], [phone] Location of Provider: [facility]  Julia Julia [name] is an 19 y.o. single female who presents unaccompanied to [facility] ED due to hallucinations and delusional thinking. Pt has a diagnosis of schizoaffective disorder and was inpatient at [facility] 10/25-11/09/2019. She has been psychiatrically hospitalized several times and is currently receiving ACTT services through [facility]. Pt appears drowsy during assessment and gave brief responses to questions. Pt says she has been experiencing hallucinations which have increased in intensity. She says she is hearing the voice of Julia Julia [name] and the antichrist and hearing music playing repeatedly. She references "three investigations" and says she needs federal witness protection. She says her roommate are concerned that it is the end of the world and someone is going to kill them. Pt denies problems with sleep or appetite. She denies current suicidal ideation or history of suicide attempts. Pt denies any history of intentional self-injurious behaviors. Pt denies current homicidal ideation or history of violence. Pt denies history of alcohol or substance use, however Pt's medical record indicates a history of using various substances including methamphetamines. Pt's urine drug screen is in process.  Pt says she has been taking all her medications as prescribed. She says she lives with roommates and that they are concerned for her. She identifies her father as her primary support. Pt says she contacted her ACTT team yesterday.  EDP Julia McDonald, PA-C contacted Pt's roommate for collateral information and documents: "Although she denies HI, the patient gave Julia [name] permission to speak with her roommate, Julia Julia [name], at [facility] recovery house where the patient has been living.  Julia Julia [name] reports that earlier  today she and some of the other women that live in the home became concerned when the patient began acting erratically and saying unusual things.  She states that the patient was complaining of being involved in a sex trafficking ring.  She said that secret service people were talking to her through the trees.  She was repeatedly telling the roommates that she hated the mall and she had a hit out on them.  The roommate is also concerned that she may be using drugs again, but she is unsure.  She notes that she left the home with 2 different men earlier in the day.  She reports that the patient was in her usual state of health until she began making unusual comments earlier today. The roommates attempted to call the patient's ACT team and were advised that they could not receive any information about her health, but advised them to have her taken to the ER for evaluation."  Pt is dressed in hospital scrubs, drowsy and oriented x4. Pt speaks in a soft tone, at low volume and normal pace. Motor behavior appears normal. Eye contact is minimal. Pt's mood is anxious and affect is mildly anxious. Thought process is coherent. Pt was minimally cooperative during assessment. She says she is willing to sign voluntarily into a psychiatric facility.  Note from Discharged Summary by Julia Sine, NP on 05/17/2020:  Past Psychiatric History: Patient has had multiple psychiatric hospitalizations within our system.  Within the last 12 months she has been admitted 5-6 times previously.  She has been admitted at [facility] as well as the behavioral health hospital.  She has a history of methamphetamine dependence.  She is a diagnosis of schizophrenia with possible onset  secondary to her methamphetamine dependence.  She is also been previously diagnosed with schizoaffective disorder.  She has remote history reportedly of attention deficit disorder.  Her substance abuse issues began approximately at age 29 or  61.  Hospital Course:  From admission H&P: [name] familiar to Julia [name] from multiple hospitalizations at the [facility] behavioral health system both at the behavioral health hospital as well as [facility] who presented to the behavioral health hospital as a walk-in patient.  She has a past psychiatric history significant for schizophrenia/schizoaffective disorder as well as polysubstance use disorders.  Her primary substances of choice include methamphetamines as well as cocaine.  During her previous hospitalization she has been on multiple medications, referred to group homes, referred to shelters, and had ACTT services of which she always seems to be noncompliant with follow-up.  She was seen and was clearly altered.  She was paranoid and agitated.  When she arrived on the unit she began to yell.  This is basically her baseline.  She is very paranoid at times.  She admitted that she had used cocaine as well as some other unspecified substance.  She stated that she remains homeless.  She stated that when she had attempted to go to live with her boyfriend it "did not work out".  She is clearly responding to internal stimuli.  She was admitted to the hospital for evaluation and stabilization.   Julia Julia [name] was admitted for acute psychosis. She has history of multiple hospitalizations as noted above. She remained on the [facility] unit for nine days. She was restarted on Depakote, Zyprexa, and Prozac, PRN Vistaril, PRN trazodone. Wellbutrin and Neurontin were started. She participated in group therapy on the unit. She responded well to treatment with no adverse effects reported. She has shown calmer mood and affect with improved sleep and interaction. She denies any SI/HI/AVH and contracts for safety. She is discharging on the medications listed below. She agrees to follow up with [facility] ACT (see below). Patient is provided with prescriptions for medications upon discharge. She is  discharging to [employer] via [employer].    Diagnosis: F25.0 Schizoaffective disorder   Past Medical History:  Past Medical History:  Diagnosis Date  . Burning with urination 05/03/2015  . Contraceptive management 05/03/2015  . Depression   . Dysmenorrhea 12/30/2013  . Heroin addiction ([place])   . Menorrhagia 12/30/2013  . Menstrual extraction 12/30/2013  . Migraines   . Schizophrenia ([place])   . Social anxiety disorder 09/25/2016  . Suicidal ideations   . Vaginal odor 05/03/2015    Past Surgical History:  Procedure Laterality Date  . NO PAST SURGERIES      Family History:  Family History  Problem Relation Age of Onset  . Depression Mother   . Hypertension Father   . Hyperlipidemia Father   . Cancer Paternal Grandmother        breast, uterine  . Cirrhosis Paternal Grandfather        due to alcohol    Social History:  reports that she has been smoking cigarettes. She has been smoking about 1.00 pack per day. She has never used smokeless tobacco. She reports previous alcohol use. She reports previous drug use. Drugs: Marijuana, Oxycodone, and Cocaine.  Additional Social History:  Alcohol / Drug Use Pain Medications: See MAR Prescriptions: See MAR Over the Counter: See MAR History of alcohol / drug use?: Yes Longest period of sobriety (when/how long): Unknown Substance #1 Name  of Substance 1: Cannabis 1 - Age of First Use: 14 1 - Amount (size/oz): Varies 1 - Frequency: Varies 1 - Duration: Ongoing 1 - Last Use / Amount: 1 week ago Substance #2 Name of Substance 2: Cocaine 2 - Age of First Use: UTA 2 - Amount (size/oz): $20 2 - Frequency: about once a month 2 - Duration: UTA 2 - Last Use / Amount: UTA Substance #3 Name of Substance 3: xanax 3 - Age of First Use: 14 3 - Amount (size/oz): 1/2 gram 3 - Frequency: twice a month 3 - Duration: ongoing 3 - Last Use / Amount: unknown  CIWA: CIWA-Ar BP: 98/65 Pulse Rate: 89 COWS:    Allergies:  Allergies   Allergen Reactions  . Abilify [Aripiprazole]   . Zoloft [Sertraline Hcl]     Home Medications: (Not in a hospital admission)   OB/GYN Status:  No LMP recorded.  General Assessment Data [facility]: [facility] TTS Assessment: In system Is this a Tele or Face-to-Face Assessment?: Tele Assessment Is this an Initial Assessment or a Re-assessment for this encounter?: Initial Assessment Patient Accompanied by:: N/A Language Other than English: No Living Arrangements: Other (Comment) ([place]) What gender do you identify as?: Female Date Telepsych consult ordered in [facility]: 05/21/20 Time Telepsych consult ordered in [facility]: 0130 Marital status: [place] name: NA Pregnancy Status: No Living Arrangements: Other (Comment) ([place]) Can pt return to current living arrangement?: Yes Admission Status: Voluntary Is patient capable of signing voluntary admission?: Yes Referral Source: Self/Family/Friend Insurance type: Medicaid     Crisis Care Plan Living Arrangements: Other (Comment) Risk analyst) Legal Guardian: Other: (Self) Name of Psychiatrist: [name] Name of Therapist: [name]  Education Status Is patient currently in school?: No Is the patient employed, unemployed or receiving disability?: Unemployed  Risk to self with the past 6 months Suicidal Ideation: No Has patient been a risk to self within the past 6 months prior to admission? : No Suicidal Intent: No Has patient had any suicidal intent within the past 6 months prior to admission? : No Is patient at risk for suicide?: No Suicidal Plan?: No Has patient had any suicidal plan within the past 6 months prior to admission? : No Access to Means: No What has been your use of drugs/alcohol within the last 12 months?: Pt has a history of substance use Previous Attempts/Gestures: No How many times?: 0 Other Self Harm Risks: None Triggers for Past Attempts: None known Intentional Self Injurious  Behavior: None Family Suicide History: No Recent stressful life event(s): Other (Comment) ("The investigations") Persecutory voices/beliefs?: Yes Depression: Yes Depression Symptoms: Fatigue Substance abuse history and/or treatment for substance abuse?: Yes Suicide prevention information given to non-admitted patients: Not applicable  Risk to Others within the past 6 months Homicidal Ideation: No Does patient have any lifetime risk of violence toward others beyond the six months prior to admission? : No Thoughts of Harm to Others: No Current Homicidal Intent: No Current Homicidal Plan: No Access to Homicidal Means: No Identified Victim: None History of harm to others?: No Assessment of Violence: None Noted Violent Behavior Description: Pt denies Does patient have access to weapons?: No Criminal Charges Pending?: No Does patient have a court date: No Is patient on probation?: No  Psychosis Hallucinations: Auditory Delusions: Persecutory  Mental Status Report Appearance/Hygiene: In scrubs Eye Contact: Poor Motor Activity: Unremarkable Speech: Soft, Slow Level of Consciousness: Drowsy Mood: Anxious Affect: Anxious Anxiety Level: Moderate Thought Processes: Coherent Judgement: Impaired  Orientation: Person, Place, Situation, Time Obsessive Compulsive Thoughts/Behaviors: None  Cognitive Functioning Concentration: Decreased Memory: Recent Intact, Remote Intact Is patient IDD: No Insight: Poor Impulse Control: Fair Appetite: Fair Have you had any weight changes? : No Change Sleep: No Change Total Hours of Sleep: 8 Vegetative Symptoms: None  ADLScreening [facility] Assessment Services) Patient's cognitive ability adequate to safely complete daily activities?: Yes Patient able to express need for assistance with ADLs?: Yes Independently performs ADLs?: Yes (appropriate for developmental age)  Prior Inpatient Therapy Prior Inpatient Therapy: Yes Prior Therapy Dates:  05/18/2020, multiple admits Prior Therapy Facilty/Provider(s): [facility] Reason for Treatment: Schizoaffective disorder  Prior Outpatient Therapy Prior Outpatient Therapy: Yes Prior Therapy Dates: Ongoing  Prior Therapy Facilty/Provider(s): [name]  Reason for Treatment: Med mang Does patient have an ACCT team?: Yes Ivor [place] [facility]) Does patient have Intensive In-House Services?  : No Does patient have [facility] services? : No Does patient have P4CC services?: No  ADL Screening (condition at time of admission) Patient's cognitive ability adequate to safely complete daily activities?: Yes Is the patient deaf or have difficulty hearing?: No Does the patient have difficulty seeing, even when wearing glasses/contacts?: No Does the patient have difficulty concentrating, remembering, or making decisions?: No Patient able to express need for assistance with ADLs?: Yes Does the patient have difficulty dressing or bathing?: No Independently performs ADLs?: Yes (appropriate for developmental age) Does the patient have difficulty walking or climbing stairs?: No Weakness of Legs: None Weakness of Arms/Hands: None  Home Assistive Devices/Equipment Home Assistive Devices/Equipment: Long-handled shoehorn    Abuse/Neglect Assessment (Assessment to be complete while patient is alone) Abuse/Neglect Assessment Can Be Completed: Yes Physical Abuse: Denies Verbal Abuse: Denies Sexual Abuse: Denies Exploitation of patient/patient's resources: Denies Self-Neglect: Denies     Regulatory affairs officer (For Healthcare) Does Patient Have a Medical Advance Directive?: No Would patient like information on creating a medical advance directive?: No - Patient declined          Disposition: Gave clinical report to Trinna Post, PA who recommended Pt be transferred to [facility] for continuous assessment. Notified Julia McDonald, PA and Lupita Dawn, RN of recommendation. Notified [facility] staff of  transfer.   Disposition Initial Assessment Completed for this Encounter: Yes  This service was provided via telemedicine using a 2-way, interactive audio and video technology.  Names of all persons participating in this telemedicine service and their role in this encounter. Name: Julia Julia [name] Role: Patient  Name: Storm Frisk, [facility] Role: TTS counselor         Orpah Greek Anson Fret, [facility], [facility] Triage Specialist [phone]  Evelena Peat 05/21/2020 2:36 AM

## 2020-05-21 NOTE — Progress Notes (Addendum)
Safe transport called to bring pt over to [facility]. No answer after two attempts

## 2020-05-21 NOTE — Progress Notes (Addendum)
Received Julia [name] this AM asleep in her chair bed, she was awaken by the NP,irritable but agreed to talk with her. After 5 minutes she became increased irritable and their conversation ended. Shortly thereafter she woke back up and stated the voices were bothering her and agreed to take her medication. She slept throughout the day without incident until 1330 when she woke up and asked if she was being  discharge.

## 2020-05-21 NOTE — ED Notes (Signed)
Patient belongings are stored in locker#05

## 2020-05-21 NOTE — Progress Notes (Signed)
Report called to Miguel Rota, RN

## 2020-05-21 NOTE — ED Notes (Signed)
Pt A&O x 4, [facility] transfer, presents with hallucinations of antichrist and celebrities.  Denies SI, HI. Pt calm & cooperative with flat affect.  Skin search completed, monitoring for safety, no distress noted.

## 2020-05-21 NOTE — ED Provider Notes (Signed)
Patient has been evaluated by TTS who recommend she go to the behavioral health urgent care.  When I go in the room patient is awake.  She is agreeable to go there for treatment.  She states she has had episodes like this before.  She states she is taking her medications daily.  Rolland Porter, MD, Barbette Or, MD 05/21/20 [phone]

## 2020-05-21 NOTE — Progress Notes (Signed)
Patient meets criteria for inpatient treatment per [name] NP. No appropriate beds at [facility] available at this time. CSW faxed referrals to the following facilities for review:  [place], [place], [place], [place], [place], [place], [place], [place], [place], [place], [place], [place], [place], [place].   TTS will continue to seek bed placement.   CSW also reached out to pt's [place] [phone]) and spoke with Quita Skye to notify him of pt's hospitalization and to discuss pt's multiple ED visits/psychiatric hospitalizations and the recommendation for the ACT team to addend her Clinical Comprehensive Assessment to support a higher level of care (such as group home placement). Adam was not familiar with pt's history but stated that he would pass this recommendation on to the week day team and MD tomorrow. Adam was in agreement that based on pt's multiple admissions and continued mental health/substance abuse issues, and inability to maintain for more than a week or two of stability outside of the hospital setting even with ACT services, she would likely benefit from a higher level of care.    Maxie Better, MSW, LCSW Clinical Social Worker 05/21/2020 11:05 AM

## 2020-05-22 NOTE — ED Provider Notes (Signed)
FBC/OBS ASAP Discharge Summary  Date and Time: 05/22/2020 8:08 AM  Name: Julia [name]  MRN:  [id number]   Discharge Diagnoses:  Final diagnoses:  Cocaine dependence with cocaine-induced psychotic disorder with complication ([place])  Schizophrenia, paranoid ([place])    Subjective: Patient reports this morning as he is still feels unstable and that she should be going to inpatient unit.  Patient is agreeable for being discharged to [facility].  Patient was accepted yesterday and is being transported this morning.  Stay Summary: [name] that is well-known to this provider.  Patient had presented reporting that she was staying in [place] and was asked to leave because she did not feel safe there.  Patient reports that she has had some recent stressors such as being homeless and that she feels that she is in the witness protection program and that the police would not assisting her.  Patient had presented with delusional thought content and will reported that this is been going on for approximately 1 year.  Patient was admitted to the continuous observation unit for overnight problems.  Patient was continued on medications of Cogentin 2 mg p.o. twice daily, Wellbutrin XL 150 mg p.o. daily, Depakote 750 mg p.o. twice daily, Prozac 40 mg p.o. daily, Neurontin 400 mg p.o. 3 times daily, and Zyprexa.  Patient was meeting inpatient criteria and was accepted at [facility].  Patient was transported to [facility] via safe transport today.  Total Time spent with patient: 20 minutes  Past Psychiatric History: Schizophrenia, suicidal ideations, social anxiety disorder, depression, numerous hospital admissions Past Medical History:  Past Medical History:  Diagnosis Date  . Burning with urination 05/03/2015  . Contraceptive management 05/03/2015  . Depression   . Dysmenorrhea 12/30/2013  . Heroin addiction ([place])   . Menorrhagia 12/30/2013  . Menstrual  extraction 12/30/2013  . Migraines   . Schizophrenia ([place])   . Social anxiety disorder 09/25/2016  . Suicidal ideations   . Vaginal odor 05/03/2015    Past Surgical History:  Procedure Laterality Date  . NO PAST SURGERIES     Family History:  Family History  Problem Relation Age of Onset  . Depression Mother   . Hypertension Father   . Hyperlipidemia Father   . Cancer Paternal Grandmother        breast, uterine  . Cirrhosis Paternal Grandfather        due to alcohol   Family Psychiatric History: Mother-depression Social History:  Social History   Substance and Sexual Activity  Alcohol Use Not Currently     Social History   Substance and Sexual Activity  Drug Use Not Currently  . Types: Marijuana, Oxycodone, Cocaine    Social History   Socioeconomic History  . Marital status: Single    Spouse name: Not on file  . Number of children: Not on file  . Years of education: Not on file  . Highest education level: Not on file  Occupational History  . Occupation: Unemployed  Tobacco Use  . Smoking status: Current Every Day Smoker    Packs/day: 1.00    Types: Cigarettes  . Smokeless tobacco: Never Used  Vaping Use  . Vaping Use: Never used  Substance and Sexual Activity  . Alcohol use: Not Currently  . Drug use: Not Currently    Types: Marijuana, Oxycodone, Cocaine  . Sexual activity: Not on file  Other Topics Concern  . Not on file  Social History Narrative  06/23/2019:  Pt stated that she is homeless, that she is a high school graduate, and that she is unemployed and not followed by any outpatient provider.      Lives with Dad. Mom has LGD, lives in [place]. 11th grader. Dog.   Social Determinants of Health   Financial Resource Strain:   . Difficulty of Paying Living Expenses: Not on file  Food Insecurity:   . Worried About Charity fundraiser in the Last Year: Not on file  . Ran Out of Food in the Last Year: Not on file  Transportation Needs:   . Lack of  Transportation (Medical): Not on file  . Lack of Transportation (Non-Medical): Not on file  Physical Activity:   . Days of Exercise per Week: Not on file  . Minutes of Exercise per Session: Not on file  Stress:   . Feeling of Stress : Not on file  Social Connections:   . Frequency of Communication with Friends and Family: Not on file  . Frequency of Social Gatherings with Friends and Family: Not on file  . Attends Religious Services: Not on file  . Active Member of Clubs or Organizations: Not on file  . Attends Archivist Meetings: Not on file  . Marital Status: Not on file   SDOH:  SDOH Screenings   Alcohol Screen: Low Risk   . Last Alcohol Screening Score (AUDIT): 0  Depression (PHQ2-9):   . PHQ-2 Score: Not on file  Financial Resource Strain:   . Difficulty of Paying Living Expenses: Not on file  Food Insecurity:   . Worried About Charity fundraiser in the Last Year: Not on file  . Ran Out of Food in the Last Year: Not on file  Housing:   . Last Housing Risk Score: Not on file  Physical Activity:   . Days of Exercise per Week: Not on file  . Minutes of Exercise per Session: Not on file  Social Connections:   . Frequency of Communication with Friends and Family: Not on file  . Frequency of Social Gatherings with Friends and Family: Not on file  . Attends Religious Services: Not on file  . Active Member of Clubs or Organizations: Not on file  . Attends Archivist Meetings: Not on file  . Marital Status: Not on file  Stress:   . Feeling of Stress : Not on file  Tobacco Use: High Risk  . Smoking Tobacco Use: Current Every Day Smoker  . Smokeless Tobacco Use: Never Used  Transportation Needs:   . Film/video editor (Medical): Not on file  . Lack of Transportation (Non-Medical): Not on file    Has this patient used any form of tobacco in the last 30 days? (Cigarettes, Smokeless Tobacco, Cigars, and/or Pipes) A prescription for an FDA-approved  tobacco cessation medication was offered at discharge and the patient refused  Current Medications:  Current Facility-Administered Medications  Medication Dose Route Frequency Provider Last Rate Last Admin  . acetaminophen (TYLENOL) tablet 650 mg  650 mg Oral Q6H PRN Caroline Sauger, NP      . alum & mag hydroxide-simeth (MAALOX/MYLANTA) 200-200-20 MG/5ML suspension 30 mL  30 mL Oral Q4H PRN Caroline Sauger, NP      . benztropine (COGENTIN) tablet 2 mg  2 mg Oral BID PRN Caroline Sauger, NP      . buPROPion (WELLBUTRIN XL) 24 hr tablet 150 mg  150 mg Oral Daily Caroline Sauger, NP   150 mg  at 05/21/20 1035  . divalproex (DEPAKOTE) DR tablet 750 mg  750 mg Oral Q12H Caroline Sauger, NP   750 mg at 05/21/20 2125  . FLUoxetine (PROZAC) capsule 40 mg  40 mg Oral Daily Caroline Sauger, NP   40 mg at 05/21/20 1036  . gabapentin (NEURONTIN) capsule 400 mg  400 mg Oral TID Caroline Sauger, NP   400 mg at 05/21/20 2125  . hydrOXYzine (ATARAX/VISTARIL) tablet 50 mg  50 mg Oral TID PRN Caroline Sauger, NP      . magnesium hydroxide (MILK OF MAGNESIA) suspension 30 mL  30 mL Oral Daily PRN Caroline Sauger, NP      . OLANZapine zydis (ZYPREXA) disintegrating tablet 30 mg  30 mg Oral QHS Caroline Sauger, NP   30 mg at 05/21/20 2125  . pantoprazole (PROTONIX) EC tablet 40 mg  40 mg Oral QHS Caroline Sauger, NP   40 mg at 05/21/20 2125  . traZODone (DESYREL) tablet 100 mg  100 mg Oral QHS PRN Caroline Sauger, NP       Current Outpatient Medications  Medication Sig Dispense Refill  . benztropine (COGENTIN) 2 MG tablet Take 1 tablet (2 mg total) by mouth 2 (two) times daily as needed for tremors (EPS). 15 tablet 0  . buPROPion (WELLBUTRIN XL) 150 MG 24 hr tablet Take 1 tablet (150 mg total) by mouth daily. 30 tablet 0  . divalproex (DEPAKOTE) 250 MG DR tablet Take 3 tablets (750 mg total) by mouth every 12 (twelve) hours. 180 tablet 0  . FLUoxetine  (PROZAC) 40 MG capsule Take 1 capsule (40 mg total) by mouth daily. 30 capsule 0  . gabapentin (NEURONTIN) 400 MG capsule Take 1 capsule (400 mg total) by mouth 3 (three) times daily. 90 capsule 0  . hydrOXYzine (ATARAX/VISTARIL) 50 MG tablet Take 1 tablet (50 mg total) by mouth 3 (three) times daily as needed for anxiety. 90 tablet 0  . pantoprazole (PROTONIX) 40 MG tablet Take 1 tablet (40 mg total) by mouth at bedtime. 30 tablet 0  . traZODone (DESYREL) 100 MG tablet Take 1 tablet (100 mg total) by mouth at bedtime as needed for sleep. 15 tablet 0  . olanzapine zydis (ZYPREXA) 15 MG disintegrating tablet Take 2 tablets (30 mg total) by mouth at bedtime. (Patient not taking: Reported on 05/21/2020) 60 tablet 0    PTA Medications: (Not in a hospital admission)   Musculoskeletal  Strength & Muscle Tone: within normal limits Gait & Station: normal Patient leans: N/A  Psychiatric Specialty Exam  Presentation  General Appearance: Appropriate for Environment;Disheveled  Eye Contact:Fair  Speech:Clear and Coherent;Normal Rate  Speech Volume:Decreased  Handedness:Right   Mood and Affect  Mood:Anxious  Affect:Flat   Thought Process  Thought Processes:Coherent;Goal Directed  Descriptions of Associations:Intact  Orientation:Full (Time, Place and Person)  Thought Content:WDL  Hallucinations:Hallucinations: None Description of Auditory Hallucinations: [place] Shows, the baby voices speaking to her  Ideas of Reference:Delusions  Suicidal Thoughts:Suicidal Thoughts: No  Homicidal Thoughts:Homicidal Thoughts: No   Sensorium  Memory:Immediate Fair;Recent Fair;Remote Fair  Judgment:Fair  Insight:Lacking   Executive Functions  Concentration:Fair  Attention Span:Fair  [place]   Psychomotor Activity  Psychomotor Activity:Psychomotor Activity: Normal   Assets  Assets:Communication Skills;Desire for Improvement;Financial  Resources/Insurance;Social Support   Sleep  Sleep:Sleep: Fair   Physical Exam  Physical Exam Vitals and nursing note reviewed.  Constitutional:      Appearance: She is well-developed.  HENT:     Head: Normocephalic.  Eyes:     Pupils: Pupils are equal, round, and reactive to light.  Cardiovascular:     Rate and Rhythm: Normal rate.  Pulmonary:     Effort: Pulmonary effort is normal.  Musculoskeletal:        General: Normal range of motion.  Neurological:     Mental Status: She is alert and oriented to person, place, and time.    Review of Systems  Constitutional: Negative.   HENT: Negative.   Eyes: Negative.   Respiratory: Negative.   Cardiovascular: Negative.   Gastrointestinal: Negative.   Genitourinary: Negative.   Musculoskeletal: Negative.   Skin: Negative.   Neurological: Negative.   Endo/Heme/Allergies: Negative.    Blood pressure 106/74, pulse 93, temperature 98 F (36.7 C), temperature source Oral, resp. rate 18, height 5' 6.14" (1.68 m), weight 85.3 kg, SpO2 100 %. Body mass index is 30.21 kg/m.  Disposition: Discharge to [place], [place] 05/22/2020, 8:08 AM

## 2020-05-22 NOTE — Discharge Instructions (Addendum)
Discharge to [facility]

## 2020-05-22 NOTE — ED Notes (Signed)
Admin meds early d/t Pt being transported to [facility].

## 2020-05-22 NOTE — ED Notes (Signed)
Pt resting with eyes closed. Chest rise and fall noted. No new issues noted. Will continue to monitor for safety

## 2020-05-22 NOTE — ED Notes (Signed)
Pt sleeping@this time. Breathing even and unlabored. Will continue to monitor for safety 

## 2020-05-22 NOTE — ED Notes (Signed)
Safe transport called and form faxed, received confirmation for transportation. Report called to [facility], spoke with [facility]. Morning meds admin prior to d/c from facility. Personal belongings returned Pt's locker.  Pt escorted to [facility]. Paperwork given to driver. Safety maintained.

## 2020-06-02 DIAGNOSIS — F22 Delusional disorders: Secondary | ICD-10-CM | POA: Insufficient documentation

## 2020-06-02 DIAGNOSIS — R45850 Homicidal ideations: Secondary | ICD-10-CM | POA: Insufficient documentation

## 2020-06-02 DIAGNOSIS — F209 Schizophrenia, unspecified: Secondary | ICD-10-CM | POA: Insufficient documentation

## 2020-06-02 DIAGNOSIS — Z765 Malingerer [conscious simulation]: Secondary | ICD-10-CM

## 2020-06-02 DIAGNOSIS — R4589 Other symptoms and signs involving emotional state: Secondary | ICD-10-CM | POA: Insufficient documentation

## 2020-06-09 ENCOUNTER — Other Ambulatory Visit (HOSPITAL_COMMUNITY): Payer: Self-pay | Admitting: Psychiatry

## 2020-09-04 ENCOUNTER — Emergency Department (HOSPITAL_COMMUNITY)
Admission: EM | Admit: 2020-09-04 | Discharge: 2020-09-05 | Disposition: A | Discharge status: Home / Self Care | Payer: Medicaid Other | Attending: Emergency Medicine | Admitting: Emergency Medicine

## 2020-09-04 ENCOUNTER — Encounter (HOSPITAL_COMMUNITY): Payer: Self-pay

## 2020-09-04 ENCOUNTER — Other Ambulatory Visit: Payer: Self-pay

## 2020-09-04 DIAGNOSIS — F251 Schizoaffective disorder, depressive type: Secondary | ICD-10-CM | POA: Diagnosis not present

## 2020-09-04 DIAGNOSIS — F259 Schizoaffective disorder, unspecified: Secondary | ICD-10-CM | POA: Diagnosis present

## 2020-09-04 DIAGNOSIS — Z560 Unemployment, unspecified: Secondary | ICD-10-CM | POA: Diagnosis not present

## 2020-09-04 DIAGNOSIS — F1910 Other psychoactive substance abuse, uncomplicated: Secondary | ICD-10-CM | POA: Insufficient documentation

## 2020-09-04 DIAGNOSIS — R112 Nausea with vomiting, unspecified: Secondary | ICD-10-CM | POA: Insufficient documentation

## 2020-09-04 DIAGNOSIS — Z5900 Homelessness unspecified: Secondary | ICD-10-CM | POA: Insufficient documentation

## 2020-09-04 DIAGNOSIS — Z765 Malingerer [conscious simulation]: Secondary | ICD-10-CM

## 2020-09-04 DIAGNOSIS — F22 Delusional disorders: Secondary | ICD-10-CM

## 2020-09-04 DIAGNOSIS — Z79899 Other long term (current) drug therapy: Secondary | ICD-10-CM | POA: Diagnosis not present

## 2020-09-04 DIAGNOSIS — F17210 Nicotine dependence, cigarettes, uncomplicated: Secondary | ICD-10-CM | POA: Insufficient documentation

## 2020-09-04 DIAGNOSIS — U071 COVID-19: Secondary | ICD-10-CM | POA: Diagnosis not present

## 2020-09-04 DIAGNOSIS — F419 Anxiety disorder, unspecified: Secondary | ICD-10-CM | POA: Diagnosis present

## 2020-09-04 LAB — SALICYLATE LEVEL: Salicylate Lvl: <7 mg/dL — ABNORMAL LOW (ref 7.0–30.0)

## 2020-09-04 LAB — DIFFERENTIAL
Abs Immature Granulocytes: 0.02 10*3/uL (ref 0.00–0.07)
Basophils Absolute: 0 10*3/uL (ref 0.0–0.1)
Basophils Relative: 0 %
Eosinophils Absolute: 0 10*3/uL (ref 0.0–0.5)
Eosinophils Relative: 0 %
Immature Granulocytes: 0 %
Lymphocytes Relative: 35 %
Lymphs Abs: 2.2 10*3/uL (ref 0.7–4.0)
Monocytes Absolute: 0.4 10*3/uL (ref 0.1–1.0)
Monocytes Relative: 6 %
Neutro Abs: 3.6 10*3/uL (ref 1.7–7.7)
Neutrophils Relative %: 59 %

## 2020-09-04 LAB — COMPREHENSIVE METABOLIC PANEL
ALT: 111 U/L — ABNORMAL HIGH (ref 0–44)
AST: 33 U/L (ref 15–41)
Albumin: 4.3 g/dL (ref 3.5–5.0)
Alkaline Phosphatase: 97 U/L (ref 38–126)
Anion gap: 11 (ref 5–15)
BUN: 10 mg/dL (ref 6–20)
CO2: 23 mmol/L (ref 22–32)
Calcium: 9.4 mg/dL (ref 8.9–10.3)
Chloride: 102 mmol/L (ref 98–111)
Creatinine, Ser: 0.72 mg/dL (ref 0.44–1.00)
GFR, Estimated: >60 mL/min (ref >60)
Glucose, Bld: 120 mg/dL — ABNORMAL HIGH (ref 70–99)
Potassium: 3.5 mmol/L (ref 3.5–5.1)
Sodium: 136 mmol/L (ref 135–145)
Total Bilirubin: 0.5 mg/dL (ref 0.3–1.2)
Total Protein: 8.1 g/dL (ref 6.5–8.1)

## 2020-09-04 LAB — CBC
HCT: 43.9 % (ref 36.0–46.0)
Hemoglobin: 13.9 g/dL (ref 12.0–15.0)
MCH: 29 pg (ref 26.0–34.0)
MCHC: 31.7 g/dL (ref 30.0–36.0)
MCV: 91.6 fL (ref 80.0–100.0)
Platelets: 330 10*3/uL (ref 150–400)
RBC: 4.79 MIL/uL (ref 3.87–5.11)
RDW: 14.4 % (ref 11.5–15.5)
WBC: 6.2 10*3/uL (ref 4.0–10.5)
nRBC: 0 % (ref 0.0–0.2)

## 2020-09-04 LAB — RESP PANEL BY RT-PCR (FLU A&B, COVID) ARPGX2
Influenza A by PCR: NEGATIVE
Influenza B by PCR: NEGATIVE
SARS Coronavirus 2 by RT PCR: POSITIVE — AB

## 2020-09-04 LAB — VALPROIC ACID LEVEL: Valproic Acid Lvl: <10 ug/mL — ABNORMAL LOW (ref 50.0–100.0)

## 2020-09-04 LAB — ETHANOL: Alcohol, Ethyl (B): <10 mg/dL (ref <10)

## 2020-09-04 LAB — ACETAMINOPHEN LEVEL: Acetaminophen (Tylenol), Serum: <10 ug/mL — ABNORMAL LOW (ref 10–30)

## 2020-09-04 LAB — LIPASE, BLOOD: Lipase: 22 U/L (ref 11–51)

## 2020-09-04 MED ORDER — BUPROPION HCL ER (XL) 150 MG PO TB24
150.0000 mg | ORAL_TABLET | Freq: Every day | ORAL | Status: DC
  Administered 2020-09-04 – 2020-09-05 (×2): 150 mg via ORAL
  Filled 2020-09-04 (×2): qty 1

## 2020-09-04 MED ORDER — ACETAMINOPHEN 325 MG PO TABS
650.0000 mg | ORAL_TABLET | Freq: Once | ORAL | Status: AC
  Administered 2020-09-04: 650 mg via ORAL
  Filled 2020-09-04: qty 2

## 2020-09-04 MED ORDER — OLANZAPINE 5 MG PO TBDP
15.0000 mg | ORAL_TABLET | Freq: Every day | ORAL | Status: DC
  Administered 2020-09-04: 15 mg via ORAL
  Filled 2020-09-04: qty 3

## 2020-09-04 MED ORDER — HYDROXYZINE HCL 25 MG PO TABS: 50.0000 mg | ORAL_TABLET | Freq: Three times a day (TID) | ORAL | Status: DC | PRN

## 2020-09-04 MED ORDER — METOCLOPRAMIDE HCL 10 MG PO TABS
10.0000 mg | ORAL_TABLET | Freq: Once | ORAL | Status: AC
  Administered 2020-09-04: 10 mg via ORAL
  Filled 2020-09-04: qty 1

## 2020-09-04 MED ORDER — GABAPENTIN 400 MG PO CAPS
400.0000 mg | ORAL_CAPSULE | Freq: Three times a day (TID) | ORAL | Status: DC
  Administered 2020-09-04 – 2020-09-05 (×3): 400 mg via ORAL
  Filled 2020-09-04 (×3): qty 1

## 2020-09-04 MED ORDER — FLUOXETINE HCL 20 MG PO CAPS
40.0000 mg | ORAL_CAPSULE | Freq: Every day | ORAL | Status: DC
  Administered 2020-09-04 – 2020-09-05 (×2): 40 mg via ORAL
  Filled 2020-09-04 (×2): qty 2

## 2020-09-04 MED ORDER — LORAZEPAM 2 MG/ML IJ SOLN
1.0000 mg | Freq: Four times a day (QID) | INTRAMUSCULAR | Status: DC | PRN
  Administered 2020-09-04: 1 mg via INTRAMUSCULAR
  Filled 2020-09-04: qty 1

## 2020-09-04 MED ORDER — DIVALPROEX SODIUM ER 250 MG PO TB24
750.0000 mg | ORAL_TABLET | Freq: Every day | ORAL | Status: DC
  Administered 2020-09-04: 750 mg via ORAL
  Filled 2020-09-04 (×2): qty 3

## 2020-09-04 MED ORDER — TRAZODONE HCL 50 MG PO TABS
100.0000 mg | ORAL_TABLET | Freq: Every evening | ORAL | Status: DC | PRN
Start: 2020-09-04 — End: 2020-09-05

## 2020-09-04 MED ORDER — LORAZEPAM 1 MG PO TABS: 1.0000 mg | ORAL_TABLET | Freq: Four times a day (QID) | ORAL | Status: DC | PRN

## 2020-09-04 MED ORDER — BENZTROPINE MESYLATE 1 MG PO TABS
2.0000 mg | ORAL_TABLET | Freq: Two times a day (BID) | ORAL | Status: DC | PRN
Start: 2020-09-04 — End: 2020-09-05

## 2020-09-04 MED ORDER — PANTOPRAZOLE SODIUM 40 MG PO TBEC
40.0000 mg | DELAYED_RELEASE_TABLET | Freq: Every day | ORAL | Status: DC
  Administered 2020-09-04: 40 mg via ORAL
  Filled 2020-09-04: qty 1

## 2020-09-04 NOTE — ED Triage Notes (Signed)
Pt presents to ED for increased anxiety. Pt crying hysterically. Pt states she is not eating and sleeping like she should. Pt denies SI/HI. Pt denies hallucinations. Pt states she has been forgetting to take her medication. Pt states she used meth, heroin, and pain pills last used 2 days ago. Pt becoming irate at times in triage, stating "I told my doctor if she don't put me on the right medication I am going to do drugs." Pt states she has an ACT team but didn't contact them because she keeps losing the numbers. Pt states she was discharged from psych hospital in [facility] 1 week ago and recently from [facility].

## 2020-09-04 NOTE — ED Provider Notes (Signed)
[facility] EMERGENCY DEPARTMENT Provider Note   CSN: [id number] Arrival date & time: 09/04/20  1140     History Chief Complaint  Patient presents with  . Anxiety    ANALYAH [name] is a 20 y.o. female.  HPI   Patient is a 20 year old female with a history of depression, substance use, migraines, schizophrenia, social anxiety disorder, who presents to the emergency department today for psychiatric evaluation.  Patient states that she has had increased anxiety and has not been able to sleep.  She states that the antichrist and the devil are controlling her mind.  She also states that she ran into some money recently and that the [place] is after her.  She was recently admitted to the hospital and states while she was admitted there was a terrorist there.   She denies any SI or HI.  She admits to using drugs yesterday but does not elaborate on what.  She reports she has been taking her medications.  Medically, she states she has been vomiting due to the increased anxiety.  She denies any abdominal pain or other systemic symptoms at this time.  Past Medical History:  Diagnosis Date  . Burning with urination 05/03/2015  . Contraceptive management 05/03/2015  . Depression   . Dysmenorrhea 12/30/2013  . Heroin addiction ([place])   . Menorrhagia 12/30/2013  . Menstrual extraction 12/30/2013  . Migraines   . Schizophrenia ([place])   . Social anxiety disorder 09/25/2016  . Suicidal ideations   . Vaginal odor 05/03/2015    Patient Active Problem List   Diagnosis Date Noted  . Schizoaffective disorder ([place]) 05/08/2020  . Schizophrenia, paranoid ([place])   . Cocaine dependence with cocaine-induced psychotic disorder with complication ([place])   . Social anxiety disorder 09/25/2016  . Polysubstance abuse ([place])   . Dysmenorrhea 12/30/2013    Past Surgical History:  Procedure Laterality Date  . NO PAST SURGERIES       OB History    Gravida  0   Para      Term      Preterm      AB       Living        SAB      IAB      Ectopic      Multiple      Live Births              Family History  Problem Relation Age of Onset  . Depression Mother   . Hypertension Father   . Hyperlipidemia Father   . Cancer Paternal Grandmother        breast, uterine  . Cirrhosis Paternal Grandfather        due to alcohol    Social History   Tobacco Use  . Smoking status: Current Every Day Smoker    Packs/day: 1.00    Types: Cigarettes  . Smokeless tobacco: Never Used  Vaping Use  . Vaping Use: Never used  Substance Use Topics  . Alcohol use: Not Currently  . Drug use: Yes    Types: Marijuana, Oxycodone, Cocaine, Methamphetamines    Home Medications Prior to Admission medications   Medication Sig Start Date End Date Taking? Authorizing Provider  benztropine (COGENTIN) 2 MG tablet Take 1 tablet (2 mg total) by mouth 2 (two) times daily as needed for tremors (EPS). 05/17/20   Connye Burkitt, NP  buPROPion (WELLBUTRIN XL) 150 MG 24 hr tablet Take 1 tablet (150 mg total) by mouth  daily. 05/17/20   Connye Burkitt, NP  divalproex (DEPAKOTE) 250 MG DR tablet Take 3 tablets (750 mg total) by mouth every 12 (twelve) hours. 05/17/20   Connye Burkitt, NP  FLUoxetine (PROZAC) 40 MG capsule Take 1 capsule (40 mg total) by mouth daily. 05/17/20   Connye Burkitt, NP  gabapentin (NEURONTIN) 400 MG capsule Take 1 capsule (400 mg total) by mouth 3 (three) times daily. 05/17/20   Connye Burkitt, NP  hydrOXYzine (ATARAX/VISTARIL) 50 MG tablet Take 1 tablet (50 mg total) by mouth 3 (three) times daily as needed for anxiety. 05/17/20   Connye Burkitt, NP  olanzapine zydis (ZYPREXA) 15 MG disintegrating tablet Take 2 tablets (30 mg total) by mouth at bedtime. Patient not taking: Reported on 05/21/2020 05/17/20   Connye Burkitt, NP  pantoprazole (PROTONIX) 40 MG tablet Take 1 tablet (40 mg total) by mouth at bedtime. 05/17/20   Connye Burkitt, NP  traZODone (DESYREL) 100 MG tablet Take 1 tablet (100 mg  total) by mouth at bedtime as needed for sleep. 05/17/20   Connye Burkitt, NP    Allergies    Abilify [aripiprazole] and Zoloft [sertraline hcl]  Review of Systems   Review of Systems  Constitutional: Negative for fever.  HENT: Negative for ear pain and sore throat.   Eyes: Negative for visual disturbance.  Respiratory: Negative for cough and shortness of breath.   Cardiovascular: Negative for chest pain.  Gastrointestinal: Positive for nausea and vomiting. Negative for abdominal pain.  Genitourinary: Negative for dysuria and hematuria.  Musculoskeletal: Negative for back pain.  Skin: Negative for rash.  Neurological: Negative for seizures and syncope.  Psychiatric/Behavioral: Negative for suicidal ideas. The patient is nervous/anxious.   All other systems reviewed and are negative.   Physical Exam Updated Vital Signs BP 116/77 (BP Location: Left Arm)   Pulse (!) 118   Temp 99.3 F (37.4 C) (Oral)   Resp 18   Ht 5\' 3"  (1.6 m)   Wt 77.1 kg   SpO2 99%   BMI 30.11 kg/m   Physical Exam Vitals and nursing note reviewed.  Constitutional:      General: She is not in acute distress.    Appearance: She is well-developed and well-nourished.  HENT:     Head: Normocephalic and atraumatic.  Eyes:     Conjunctiva/sclera: Conjunctivae normal.  Cardiovascular:     Rate and Rhythm: Tachycardia present.  Pulmonary:     Effort: Pulmonary effort is normal.  Abdominal:     General: Abdomen is flat.  Musculoskeletal:        General: No edema.     Cervical back: Neck supple.  Skin:    General: Skin is warm and dry.  Neurological:     Mental Status: She is alert.  Psychiatric:        Mood and Affect: Affect is blunt.        Behavior: Behavior is cooperative.        Thought Content: Thought content is paranoid and delusional. Thought content does not include homicidal or suicidal ideation. Thought content does not include homicidal or suicidal plan.     ED Results / Procedures /  Treatments   Labs (all labs ordered are listed, but only abnormal results are displayed) Labs Reviewed  RESP PANEL BY RT-PCR (FLU A&B, COVID) ARPGX2 - Abnormal; Notable for the following components:      Result Value   SARS Coronavirus 2 by RT PCR POSITIVE (*)  All other components within normal limits  COMPREHENSIVE METABOLIC PANEL - Abnormal; Notable for the following components:   Glucose, Bld 120 (*)    ALT 111 (*)    All other components within normal limits  SALICYLATE LEVEL - Abnormal; Notable for the following components:   Salicylate Lvl <1.6 (*)    All other components within normal limits  ACETAMINOPHEN LEVEL - Abnormal; Notable for the following components:   Acetaminophen (Tylenol), Serum <10 (*)    All other components within normal limits  VALPROIC ACID LEVEL - Abnormal; Notable for the following components:   Valproic Acid Lvl <10 (*)    All other components within normal limits  ETHANOL  CBC  LIPASE, BLOOD  DIFFERENTIAL  RAPID URINE DRUG SCREEN, HOSP PERFORMED  CBC WITH DIFFERENTIAL/PLATELET  POC URINE PREG, ED    EKG EKG Interpretation  Date/Time:  Monday September 04 2020 13:54:00 EST Ventricular Rate:  112 PR Interval:  172 QRS Duration: 84 QT Interval:  318 QTC Calculation: 434 R Axis:   76 Text Interpretation: Sinus tachycardia Otherwise normal ECG Confirmed by Nanda Quinton [phone]) on 09/04/2020 2:07:23 PM   Radiology No results found.  Procedures Procedures   Medications Ordered in ED Medications  benztropine (COGENTIN) tablet 2 mg (has no administration in time range)  buPROPion (WELLBUTRIN XL) 24 hr tablet 150 mg (150 mg Oral Given 09/04/20 1442)  FLUoxetine (PROZAC) capsule 40 mg (40 mg Oral Given 09/04/20 1441)  gabapentin (NEURONTIN) capsule 400 mg (400 mg Oral Given 09/04/20 1522)  OLANZapine zydis (ZYPREXA) disintegrating tablet 15 mg (has no administration in time range)  hydrOXYzine (ATARAX/VISTARIL) tablet 50 mg (has no  administration in time range)  pantoprazole (PROTONIX) EC tablet 40 mg (has no administration in time range)  traZODone (DESYREL) tablet 100 mg (has no administration in time range)  divalproex (DEPAKOTE ER) 24 hr tablet 750 mg (has no administration in time range)  LORazepam (ATIVAN) tablet 1 mg ( Oral See Alternative 09/04/20 1242)    Or  LORazepam (ATIVAN) injection 1 mg (1 mg Intramuscular Given 09/04/20 1242)  metoCLOPramide (REGLAN) tablet 10 mg (10 mg Oral Given 09/04/20 1442)  acetaminophen (TYLENOL) tablet 650 mg (650 mg Oral Given 09/04/20 1522)    ED Course  I have reviewed the triage vital signs and the nursing notes.  Pertinent labs & imaging results that were available during my care of the patient were reviewed by me and considered in my medical decision making (see chart for details).    MDM Rules/Calculators/A&P                          20 y/o F presents to the ED today for psych eval. Is experiencing paranoia and delusions. Was recently admitted earlier this week. Reports drug use.  Reviewed/interpreted labs CBC unremarkable CMP with mildly elevated ALT, otherwise unremarkable Lipase neg ETOH neg Salicylate neg Acetaminophen neg COVID test is positive - no significant upper resp sxs from this. nv and elevated lfts likely secondary to this.  Pt given zofran and is able to tolerate PO in the ED. Doubt emergent intraabdominal cause of sxs. More likely related to covid. Pt appropriate for TTS eval.   [facility] evaluated the patient and recommends overnight observation. The patient has been placed in psychiatric observation due to the need to provide a safe environment for the patient while obtaining psychiatric consultation and evaluation, as well as ongoing medical and medication management to treat the patient's  condition.  The patient has not been placed under full IVC at this time.   Final Clinical Impression(s) / ED Diagnoses Final diagnoses:  Delusions ([place])  Paranoia  [facility])    Rx / DC Orders ED Discharge Orders    None       Bishop Dublin 09/04/20 1753    [name], MD 09/05/20 [phone]

## 2020-09-04 NOTE — ED Notes (Signed)
Date and time results received: 09/04/20 1645  Test: covid Critical Value: pos  Name of Provider Notified: PA Cortni
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
## 2020-09-04 NOTE — Progress Notes (Signed)
I called and updated [name] ACT team lead [name] [phone]. Ms. Truman Hayward reports patient was discharged from [facility] last week, and ACT team saw her on Thursday. She had been staying with her ex-boyfriend at that time. Ms. Truman Hayward reports patient is on [place], next due on 09/27/20. Ms. Truman Hayward reports patient was also admitted to ICU at [facility] last month for a fentanyl overdose. Patient reporting noncompliance with medications, and UDS still pending. I informed Ms. Lee patient would be held overnight to restart medications and most likely will be discharging in the morning.

## 2020-09-04 NOTE — ED Notes (Addendum)
Requested urine sample from pt. Pt goes in the the restroom and yells out "fuck you". When asked who she was speaking to she stated "you". Pt continues to yell in the bathroom but it is unclear what all she is saying. Security called back to pts room. Ativan given to pt per MD orders. Pt was compliant with injection. Will continue to monitor patient.

## 2020-09-05 ENCOUNTER — Emergency Department (HOSPITAL_COMMUNITY)
Admission: EM | Admit: 2020-09-05 | Discharge: 2020-09-05 | Disposition: A | Discharge status: Home / Self Care | Payer: Medicaid Other | Source: Home / Self Care | Attending: Emergency Medicine | Admitting: Emergency Medicine

## 2020-09-05 ENCOUNTER — Encounter (HOSPITAL_COMMUNITY): Payer: Self-pay | Admitting: *Deleted

## 2020-09-05 ENCOUNTER — Emergency Department (HOSPITAL_COMMUNITY): Payer: Medicaid Other

## 2020-09-05 ENCOUNTER — Other Ambulatory Visit: Payer: Self-pay

## 2020-09-05 DIAGNOSIS — F17210 Nicotine dependence, cigarettes, uncomplicated: Secondary | ICD-10-CM | POA: Insufficient documentation

## 2020-09-05 DIAGNOSIS — U071 COVID-19: Secondary | ICD-10-CM | POA: Insufficient documentation

## 2020-09-05 DIAGNOSIS — Z79899 Other long term (current) drug therapy: Secondary | ICD-10-CM | POA: Insufficient documentation

## 2020-09-05 DIAGNOSIS — F251 Schizoaffective disorder, depressive type: Secondary | ICD-10-CM

## 2020-09-05 DIAGNOSIS — Z765 Malingerer [conscious simulation]: Secondary | ICD-10-CM

## 2020-09-05 MED ORDER — ZIPRASIDONE MESYLATE 20 MG IM SOLR: 20.0000 mg | Freq: Once | INTRAMUSCULAR | Status: DC

## 2020-09-05 MED ORDER — LORAZEPAM 2 MG/ML IJ SOLN: 1.0000 mg | Freq: Once | INTRAMUSCULAR | Status: DC

## 2020-09-05 MED ORDER — SODIUM CHLORIDE 0.9 % IV BOLUS (SEPSIS)
1000.0000 mL | Freq: Once | INTRAVENOUS | Status: AC
  Administered 2020-09-05: 1000 mL via INTRAVENOUS

## 2020-09-05 MED ORDER — IBUPROFEN 800 MG PO TABS
800.0000 mg | ORAL_TABLET | Freq: Once | ORAL | Status: AC
  Administered 2020-09-05: 800 mg via ORAL
  Filled 2020-09-05: qty 1

## 2020-09-05 NOTE — ED Notes (Signed)
Pt was talking to TTS. Pt began screaming and yelling "I cant take this shit, I cant take it"  Pt began pushing and hitting the door, push the TTS cart into the door. Pt threw coffee and water off of tray.  MD made aware.

## 2020-09-05 NOTE — ED Provider Notes (Signed)
[facility] EMERGENCY DEPARTMENT Provider Note   CSN: [id number] Arrival date & time: 09/05/20  1729     History Chief Complaint  Patient presents with  . Covid Positive    Julia [name] is a 20 y.o. female.  Patient was diagnosed with Covid.  She complains of fevers and chills  The history is provided by the patient and medical records. No language interpreter was used.  Fever Temp source:  Subjective Severity:  Moderate Onset quality:  Sudden Timing:  Intermittent Progression:  Waxing and waning Chronicity:  New Relieved by:  Nothing Worsened by:  Nothing Associated symptoms: chills   Associated symptoms: no chest pain, no congestion, no cough, no diarrhea, no headaches and no rash        Past Medical History:  Diagnosis Date  . Burning with urination 05/03/2015  . Contraceptive management 05/03/2015  . Depression   . Dysmenorrhea 12/30/2013  . Heroin addiction ([place])   . Menorrhagia 12/30/2013  . Menstrual extraction 12/30/2013  . Migraines   . Schizophrenia ([place])   . Social anxiety disorder 09/25/2016  . Suicidal ideations   . Vaginal odor 05/03/2015    Patient Active Problem List   Diagnosis Date Noted  . Malingering 09/05/2020  . Schizoaffective disorder ([place]) 05/08/2020  . Schizophrenia, paranoid ([place])   . Cocaine dependence with cocaine-induced psychotic disorder with complication ([place])   . Social anxiety disorder 09/25/2016  . Polysubstance abuse ([place])   . Dysmenorrhea 12/30/2013    Past Surgical History:  Procedure Laterality Date  . NO PAST SURGERIES       OB History    Gravida  0   Para      Term      Preterm      AB      Living        SAB      IAB      Ectopic      Multiple      Live Births              Family History  Problem Relation Age of Onset  . Depression Mother   . Hypertension Father   . Hyperlipidemia Father   . Cancer Paternal Grandmother        breast, uterine  . Cirrhosis Paternal Grandfather         due to alcohol    Social History   Tobacco Use  . Smoking status: Current Every Day Smoker    Packs/day: 1.00    Types: Cigarettes  . Smokeless tobacco: Never Used  Vaping Use  . Vaping Use: Never used  Substance Use Topics  . Alcohol use: Not Currently  . Drug use: Yes    Types: Marijuana, Oxycodone, Cocaine, Methamphetamines    Home Medications Prior to Admission medications   Medication Sig Start Date End Date Taking? Authorizing Provider  benztropine (COGENTIN) 2 MG tablet Take 1 tablet (2 mg total) by mouth 2 (two) times daily as needed for tremors (EPS). 05/17/20   Connye Burkitt, NP  buPROPion (WELLBUTRIN XL) 150 MG 24 hr tablet Take 1 tablet (150 mg total) by mouth daily. 05/17/20   Connye Burkitt, NP  divalproex (DEPAKOTE) 250 MG DR tablet Take 3 tablets (750 mg total) by mouth every 12 (twelve) hours. 05/17/20   Connye Burkitt, NP  FLUoxetine (PROZAC) 40 MG capsule Take 1 capsule (40 mg total) by mouth daily. 05/17/20   Connye Burkitt, NP  gabapentin (NEURONTIN) 400  MG capsule Take 1 capsule (400 mg total) by mouth 3 (three) times daily. 05/17/20   Connye Burkitt, NP  hydrOXYzine (ATARAX/VISTARIL) 50 MG tablet Take 1 tablet (50 mg total) by mouth 3 (three) times daily as needed for anxiety. 05/17/20   Connye Burkitt, NP  olanzapine zydis (ZYPREXA) 15 MG disintegrating tablet Take 2 tablets (30 mg total) by mouth at bedtime. Patient not taking: Reported on 05/21/2020 05/17/20   Connye Burkitt, NP  pantoprazole (PROTONIX) 40 MG tablet Take 1 tablet (40 mg total) by mouth at bedtime. 05/17/20   Connye Burkitt, NP  traZODone (DESYREL) 100 MG tablet Take 1 tablet (100 mg total) by mouth at bedtime as needed for sleep. 05/17/20   Connye Burkitt, NP    Allergies    Abilify [aripiprazole] and Zoloft [sertraline hcl]  Review of Systems   Review of Systems  Constitutional: Positive for chills and fever. Negative for appetite change and fatigue.  HENT: Negative for congestion, ear  discharge and sinus pressure.   Eyes: Negative for discharge.  Respiratory: Negative for cough.   Cardiovascular: Negative for chest pain.  Gastrointestinal: Negative for abdominal pain and diarrhea.  Genitourinary: Negative for frequency and hematuria.  Musculoskeletal: Negative for back pain.  Skin: Negative for rash.  Neurological: Negative for seizures and headaches.  Psychiatric/Behavioral: Negative for hallucinations.    Physical Exam Updated Vital Signs BP 105/81 (BP Location: Left Arm)   Pulse (!) 117   Temp 98.1 F (36.7 C) (Oral)   Resp 16   Ht 5\' 3"  (1.6 m)   Wt 81.6 kg   SpO2 100%   BMI 31.89 kg/m   Physical Exam Vitals and nursing note reviewed.  Constitutional:      Appearance: She is well-developed.  HENT:     Head: Normocephalic.     Nose: Nose normal.  Eyes:     General: No scleral icterus.    Extraocular Movements: EOM normal.     Conjunctiva/sclera: Conjunctivae normal.  Neck:     Thyroid: No thyromegaly.  Cardiovascular:     Rate and Rhythm: Normal rate and regular rhythm.     Heart sounds: No murmur heard. No friction rub. No gallop.   Pulmonary:     Breath sounds: No stridor. No wheezing or rales.  Chest:     Chest wall: No tenderness.  Abdominal:     General: There is no distension.     Tenderness: There is no abdominal tenderness. There is no rebound.  Musculoskeletal:        General: No edema. Normal range of motion.     Cervical back: Neck supple.  Lymphadenopathy:     Cervical: No cervical adenopathy.  Skin:    Findings: No erythema or rash.  Neurological:     Mental Status: She is alert and oriented to person, place, and time.     Motor: No abnormal muscle tone.     Coordination: Coordination normal.  Psychiatric:        Mood and Affect: Mood and affect normal.        Behavior: Behavior normal.     ED Results / Procedures / Treatments   Labs (all labs ordered are listed, but only abnormal results are displayed) Labs  Reviewed - No data to display  EKG None  Radiology DG Chest [facility] 1 View  Result Date: 09/05/2020 CLINICAL DATA:  Weakness, recently diagnosed COVID-19. EXAM: PORTABLE CHEST 1 VIEW COMPARISON:  Chest radiograph dated 03/25/2020. FINDINGS:  The heart size and mediastinal contours are within normal limits. Both lungs are clear. The visualized skeletal structures are unremarkable. IMPRESSION: No active disease. Electronically Signed   By: Zerita Boers M.D.   On: 09/05/2020 20:00    Procedures Procedures   Medications Ordered in ED Medications  sodium chloride 0.9 % bolus 1,000 mL (0 mLs Intravenous Stopping Infusion hung by another clincian 09/05/20 2042)  ibuprofen (ADVIL) tablet 800 mg (800 mg Oral Given 09/05/20 1833)    ED Course  I have reviewed the triage vital signs and the nursing notes.  Pertinent labs & imaging results that were available during my care of the patient were reviewed by me and considered in my medical decision making (see chart for details).    MDM Rules/Calculators/A&P                          Patient with COVID-19.  She is nontoxic not hypoxic.  Patient was given a liter of fluids and feels better she will be discharged home Final Clinical Impression(s) / ED Diagnoses Final diagnoses:  COVID-19    Rx / DC Orders ED Discharge Orders    None       Milton Ferguson, MD 09/06/20 1526

## 2020-09-05 NOTE — ED Notes (Signed)
Pt vitals are delayed due to pt being asleep.

## 2020-09-05 NOTE — ED Notes (Signed)
[facility] called to inform this pts ACT team cannot assist with transportation due [place] the [place]. Charge & MD made aware.

## 2020-09-05 NOTE — ED Notes (Signed)
Pt provided with sandwich, applesauce, ginger ale and ice water. No other requests at this time. Will continue to monitor.

## 2020-09-05 NOTE — ED Provider Notes (Addendum)
Emergency Medicine Observation Re-evaluation Note  Julia [name] is a 20 y.o. female, seen on rounds today.  Pt initially presented to the ED for complaints of Anxiety Currently, the patient is resting.  Physical Exam  BP (!) 94/58 (BP Location: Left Arm)   Pulse 98   Temp 97.9 F (36.6 C) (Oral)   Resp 18   Ht 1.6 m (5\' 3" )   Wt 77.1 kg   SpO2 99%   BMI 30.11 kg/m  Physical Exam General: Sleeping Cardiac: Regular rate Lungs: Breathing easily no stridor Psych: Calm, sleeping  ED Course / MDM  EKG:EKG Interpretation  Date/Time:  Monday September 04 2020 13:54:00 EST Ventricular Rate:  112 PR Interval:  172 QRS Duration: 84 QT Interval:  318 QTC Calculation: 434 R Axis:   76 Text Interpretation: Sinus tachycardia Otherwise normal ECG Confirmed by Nanda Quinton [phone]) on 09/04/2020 2:07:23 PM    I have reviewed the labs performed to date as well as medications administered while in observation.  Recent changes in the last 24 hours include initial assessment by the psychiatry team..  Plan  Current plan is for overnight observation yesterday, reassessment today by psychiatry to determine ultimate disposition.    [name], MD 09/05/20 [phone] Patient was in the process of for TTS consult.  Patient became acutely agitated.  She is crying and yelling.  Pt calmed down after being spoken to.  Spoke with psychiatry, NP SYkes who is very familiar with patient.  Pt is actually at her baseline.  She has some chronic delusions.  Pt became more agitated when she was told she was most likely going to be discharged.     [name], MD 09/05/20 1059  Pt has been cleared by psychiatry for outpt treatment.  Inpatient not required.  Pt is safe for dc   [name], MD 09/05/20 [phone]

## 2020-09-05 NOTE — Progress Notes (Signed)
Called and spoke with [facility] ACT team lead [name] [phone] and updated that patient was discharging. Ms. Julia [name] states they are unable to provide transportation due to patient being out of their county but would follow up with her this afternoon.

## 2020-09-05 NOTE — Progress Notes (Signed)
Pt has been psychiatrically cleared per Harriett Sine, NP. CSW left a voice message with pt's [name] ACT team lead [name] [phone] updating her on pt's disposition. A return call was requested.    Audree Camel, MSW, LCSW, [place] Clinical Social Worker II Disposition CSW [phone]

## 2020-09-05 NOTE — ED Provider Notes (Addendum)
Patient is Covid positive 20 year old female With past medical history significant for depression, migraines, schizophrenia, social anxiety disorder, substance abuse  She presented to the ER yesterday and was placed in psych hold after medical clearance pending psych evaluation.  I arrived at bedside after patient was heard screaming. RN staff was with patient attempting to redirect. After approximately 5 minutes of screaming patient was able to be redirected. She did throw a water cup against a wall however did not attempt to hurt staff.  She continues to be delusional and states that people are after her.  On my review of prior notes it appears that she was concerned that Kerry Dory was after before.  Patient placed in IVC by my attending physician Dr. Tomi Bamberger  IM [place] ordered should patient require it however she is calm at this time.  Nursing staff will update if patient condition changes.   Pati Gallo [place], [place] 09/05/20 1044  Psychiatry discussed with Dr. Tomi Bamberger.  This appears to be a chronic issue and patient appears to have a pattern of acting out when discharge from hospitals discussed.  It appears that patient's outbursts was prompted by telepsychiatry mentioning discharge   Tedd Sias, [place] 09/05/20 1100    [name], MD 09/05/20 [phone]

## 2020-09-05 NOTE — ED Notes (Signed)
Pt assisted to bedside commode

## 2020-09-05 NOTE — ED Notes (Signed)
POC preg negative.

## 2020-09-05 NOTE — ED Notes (Signed)
Pt ambulated to bathroom without difficulty.

## 2020-09-05 NOTE — Consult Note (Signed)
Telepsych Consultation   Location of Patient: [facility] Location of Provider: [facility]  Patient Identification: Julia [name] MRN:  [id number] Principal Diagnosis: Schizoaffective disorder [facility]) Diagnosis:  Principal Problem:   Schizoaffective disorder ([place]) Active Problems:   Polysubstance abuse ([place])   Malingering   Total Time spent with patient: 20 minutes  HPI:  Reassessment: Patient seen via telepsych. Chart reviewed. Julia [name] is a 20 year old female with history of schizoaffective disorder, polysubstance abuse, and homelessness, who presented to [facility] yesterday with AVH and paranoia. Patient is familiar to me from several long-term hospitalizations in which group home placement was attempted, but patient has left or been discharged from every group home where she has been placed. Patient is COVID positive.  On assessment this morning, patient appears to be at her baseline, with flat affect and calm mood initially but becomes agitated when discharge is mentioned. She states she came to [place] after being discharged from [facility] behavioral health unit on Thursday because she did not have anywhere to live. She states she was staying with a friend in [place] but is no longer able to stay there. She denies HI/AVH and shows no signs of responding to internal stimuli. When discharge is mentioned, patient becomes agitated, states "The Taliban is after me. You have to keep me in the hospital," then starts yelling and flips over her bedside table and the telepsych machine.  I attempted to call [name] ACT team lead [name] [phone] to update with HIPAA compliant voicemail left.  Per TTS assessment: Julia [name] is a 20 year old female who [facility]. Pt is reporting increased anxiety and reduced sleep (about 2 hours at night). She has a history of schizoaffective disorder and substance abuse.   Pt states the antichrist, the  devil and the Taliban are controlling her mind and out to get her. Pt admits AVH and paranoia and then says they can't be hallucinations because they are real.   She denies SI and HI. She reports hx of a past suicide attempt. She reports snorting meth 2 days ago and says that she has only used meth 3 x. Pt denies alcohol and other drug use. Pt reports she has not been taking her medications due to her homelessness. She states she left her group home a month and a half ago due to having an issue with one of the staff there.   Pt reports for support she has [name] at her [name] group home and pt's aunt. Pt states she was inpt at [facility] and realized 3 or 4 days ago.  Disposition: Patient with history of schizoaffective disorder, polysubstance use, and homelessness. She was just discharged from [facility] behavioral health unit on Thursday and was restarted on her medications in the ED yesterday. I am familiar with this patient from her numerous hospitalizations, ED and [facility] visits, and she appears to be at her baseline and attempting to be hospitalized for secondary gain of housing. Due to chronicity of symptoms she is unlikely to benefit from acute inpatient hospitalization and also unable to be accepted to inpatient unit due to being COVID positive. She shows no evidence of acute risk of harm to self or others and is psych cleared for discharge. ED staff updated.  Past Psychiatric History: See above  Risk to Self:   Risk to Others:   Prior Inpatient Therapy:   Prior Outpatient Therapy:    Past Medical History:  Past Medical History:  Diagnosis  Date  . Burning with urination 05/03/2015  . Contraceptive management 05/03/2015  . Depression   . Dysmenorrhea 12/30/2013  . Heroin addiction ([place])   . Menorrhagia 12/30/2013  . Menstrual extraction 12/30/2013  . Migraines   . Schizophrenia ([place])   . Social anxiety disorder 09/25/2016  . Suicidal ideations   . Vaginal odor 05/03/2015     Past Surgical History:  Procedure Laterality Date  . NO PAST SURGERIES     Family History:  Family History  Problem Relation Age of Onset  . Depression Mother   . Hypertension Father   . Hyperlipidemia Father   . Cancer Paternal Grandmother        breast, uterine  . Cirrhosis Paternal Grandfather        due to alcohol   Family Psychiatric  History: Unknown Social History:  Social History   Substance and Sexual Activity  Alcohol Use Not Currently     Social History   Substance and Sexual Activity  Drug Use Yes  . Types: Marijuana, Oxycodone, Cocaine, Methamphetamines    Social History   Socioeconomic History  . Marital status: Single    Spouse name: Not on file  . Number of children: Not on file  . Years of education: Not on file  . Highest education level: Not on file  Occupational History  . Occupation: Unemployed  Tobacco Use  . Smoking status: Current Every Day Smoker    Packs/day: 1.00    Types: Cigarettes  . Smokeless tobacco: Never Used  Vaping Use  . Vaping Use: Never used  Substance and Sexual Activity  . Alcohol use: Not Currently  . Drug use: Yes    Types: Marijuana, Oxycodone, Cocaine, Methamphetamines  . Sexual activity: Not on file  Other Topics Concern  . Not on file  Social History Narrative   06/23/2019:  Pt stated that she is homeless, that she is a high school graduate, and that she is unemployed and not followed by any outpatient provider.      Lives with Dad. Mom has LGD, lives in [place]. 11th grader. Dog.   Social Determinants of Health   Financial Resource Strain: Not on file  Food Insecurity: Not on file  Transportation Needs: Not on file  Physical Activity: Not on file  Stress: Not on file  Social Connections: Not on file   Additional Social History:    Allergies:   Allergies  Allergen Reactions  . Abilify [Aripiprazole]   . Zoloft [Sertraline Hcl]     Labs:  Results for orders placed or performed during the hospital  encounter of 09/04/20 (from the past 48 hour(s))  Comprehensive metabolic panel     Status: Abnormal   Collection Time: 09/04/20 12:03 PM  Result Value Ref Range   Sodium 136 135 - 145 mmol/L   Potassium 3.5 3.5 - 5.1 mmol/L   Chloride 102 98 - 111 mmol/L   CO2 23 22 - 32 mmol/L   Glucose, Bld 120 (H) 70 - 99 mg/dL    Comment: Glucose reference range applies only to samples taken after fasting for at least 8 hours.   BUN 10 6 - 20 mg/dL   Creatinine, Ser 0.72 0.44 - 1.00 mg/dL   Calcium 9.4 8.9 - 10.3 mg/dL   Total Protein 8.1 6.5 - 8.1 g/dL   Albumin 4.3 3.5 - 5.0 g/dL   AST 33 15 - 41 U/L   ALT 111 (H) 0 - 44 U/L   Alkaline Phosphatase 97 38 -  126 U/L   Total Bilirubin 0.5 0.3 - 1.2 mg/dL   GFR, Estimated >60 >60 mL/min    Comment: (NOTE) Calculated using the CKD-EPI Creatinine Equation (2021)    Anion gap 11 5 - 15    Comment: Performed at [facility], [address] [place], [place] [address]  Ethanol     Status: None   Collection Time: 09/04/20 12:03 PM  Result Value Ref Range   Alcohol, Ethyl (B) <10 <10 mg/dL    Comment: (NOTE) Lowest detectable limit for serum alcohol is 10 mg/dL.  For medical purposes only. Performed at [facility], [address] [place], [place] [address]   Salicylate level     Status: Abnormal   Collection Time: 09/04/20 12:03 PM  Result Value Ref Range   Salicylate Lvl <4.1 (L) 7.0 - 30.0 mg/dL    Comment: Performed at [facility], [address] [place], [place] [address]  Acetaminophen level     Status: Abnormal   Collection Time: 09/04/20 12:03 PM  Result Value Ref Range   Acetaminophen (Tylenol), Serum <10 (L) 10 - 30 ug/mL    Comment: (NOTE) Therapeutic concentrations vary significantly. A range of 10-30 ug/mL  may be an effective concentration for many patients. However, some  are best treated at concentrations outside of this range. Acetaminophen concentrations >150 ug/mL at 4 hours after ingestion  and >50 ug/mL at 12 hours  after ingestion are often associated with  toxic reactions.  Performed at [facility], [address], [place], [place] [address]   cbc     Status: None   Collection Time: 09/04/20 12:03 PM  Result Value Ref Range   WBC 6.2 4.0 - 10.5 K/uL   RBC 4.79 3.87 - 5.11 MIL/uL   Hemoglobin 13.9 12.0 - 15.0 g/dL   HCT 43.9 36.0 - 46.0 %   MCV 91.6 80.0 - 100.0 fL   MCH 29.0 26.0 - 34.0 pg   MCHC 31.7 30.0 - 36.0 g/dL   RDW 14.4 11.5 - 15.5 %   Platelets 330 150 - 400 K/uL   nRBC 0.0 0.0 - 0.2 %    Comment: Performed at [facility], [address], [place], [place] [address]  Differential     Status: None   Collection Time: 09/04/20 12:03 PM  Result Value Ref Range   Neutrophils Relative % 59 %   Neutro Abs 3.6 1.7 - 7.7 K/uL   Lymphocytes Relative 35 %   Lymphs Abs 2.2 0.7 - 4.0 K/uL   Monocytes Relative 6 %   Monocytes Absolute 0.4 0.1 - 1.0 K/uL   Eosinophils Relative 0 %   Eosinophils Absolute 0.0 0.0 - 0.5 K/uL   Basophils Relative 0 %   Basophils Absolute 0.0 0.0 - 0.1 K/uL   Immature Granulocytes 0 %   Abs Immature Granulocytes 0.02 0.00 - 0.07 K/uL    Comment: Performed at [facility], [address] [place], [place] [address]  Lipase, blood     Status: None   Collection Time: 09/04/20 12:09 PM  Result Value Ref Range   Lipase 22 11 - 51 U/L    Comment: Performed at [facility], [address], [place],  [address]  Valproic acid level     Status: Abnormal   Collection Time: 09/04/20 12:25 PM  Result Value Ref Range   Valproic Acid Lvl <10 (L) 50.0 - 100.0 ug/mL    Comment: RESULTS CONFIRMED BY MANUAL DILUTION Performed at [facility], [place]  [address] [place], [place] [address]   Resp Panel by RT-PCR (Flu A&B, Covid) Nasopharyngeal Swab     Status: Abnormal   Collection Time: 09/04/20  2:58 PM   Specimen: Nasopharyngeal Swab; Nasopharyngeal(NP) swabs in vial transport medium  Result Value Ref Range   SARS Coronavirus 2 by RT PCR POSITIVE (A) NEGATIVE     Comment: RESULT CALLED TO, READ BACK BY AND VERIFIED WITH: TRAVIS DEVOLT 09/04/20 @ 2423 BY S. BEARD (NOTE) SARS-CoV-2 target nucleic acids are DETECTED.  The SARS-CoV-2 RNA is generally detectable in upper respiratory specimens during the acute phase of infection. Positive results are indicative of the presence of the identified virus, but do not rule out bacterial infection or co-infection with other pathogens not detected by the test. Clinical correlation with patient history and other diagnostic information is necessary to determine patient infection status. The expected result is Negative.  Fact Sheet for Patients: EntrepreneurPulse.com.au  Fact Sheet for Healthcare Providers: IncredibleEmployment.be  This test is not yet approved or cleared by the [place] FDA and  has been authorized for detection and/or diagnosis of SARS-CoV-2 by FDA under an Emergency Use Authorization (EUA).  This EUA will remain in effect (meaning this test  can be used) for the duration of  the COVID-19 declaration under Section 564(b)(1) of the Act, 21 U.S.C. section 360bbb-3(b)(1), unless the authorization is terminated or revoked sooner.     Influenza A by PCR NEGATIVE NEGATIVE   Influenza B by PCR NEGATIVE NEGATIVE    Comment: (NOTE) The Xpert Xpress SARS-CoV-2/FLU/RSV plus assay is intended as an aid in the diagnosis of influenza from Nasopharyngeal swab specimens and should not be used as a sole basis for treatment. Nasal washings and aspirates are unacceptable for Xpert Xpress SARS-CoV-2/FLU/RSV testing.  Fact Sheet for Patients: EntrepreneurPulse.com.au  Fact Sheet for Healthcare Providers: IncredibleEmployment.be  This test is not yet approved or cleared by the [place] FDA and has been authorized for detection and/or diagnosis of SARS-CoV-2 by FDA under an Emergency Use Authorization (EUA). This EUA will  remain in effect (meaning this test can be used) for the duration of the COVID-19 declaration under Section 564(b)(1) of the Act, 21 U.S.C. section 360bbb-3(b)(1), unless the authorization is terminated or revoked.  Performed at [facility], [address], [place], [place] [address]     Medications:  Current Facility-Administered Medications  Medication Dose Route Frequency Provider Last Rate Last Admin  . benztropine (COGENTIN) tablet 2 mg  2 mg Oral BID PRN Connye Burkitt, NP      . buPROPion (WELLBUTRIN XL) 24 hr tablet 150 mg  150 mg Oral Daily Connye Burkitt, NP   150 mg at 09/05/20 0910  . divalproex (DEPAKOTE ER) 24 hr tablet 750 mg  750 mg Oral QHS Connye Burkitt, NP   750 mg at 09/04/20 2116  . FLUoxetine (PROZAC) capsule 40 mg  40 mg Oral Daily Connye Burkitt, NP   40 mg at 09/05/20 0910  . gabapentin (NEURONTIN) capsule 400 mg  400 mg Oral TID Connye Burkitt, NP   400 mg at 09/05/20 4315  . hydrOXYzine (ATARAX/VISTARIL) tablet 50 mg  50 mg Oral TID PRN Connye Burkitt, NP      . LORazepam (ATIVAN) tablet 1 mg  1 mg Oral Q6H PRN Connye Burkitt, NP       Or  . LORazepam (ATIVAN) injection 1 mg  1 mg Intramuscular Q6H PRN Connye Burkitt, NP   1 mg  at 09/04/20 1242  . LORazepam (ATIVAN) injection 1 mg  1 mg Intramuscular Once [name], MD      . OLANZapine zydis (ZYPREXA) disintegrating tablet 15 mg  15 mg Oral QHS Connye Burkitt, NP   15 mg at 09/04/20 2116  . pantoprazole (PROTONIX) EC tablet 40 mg  40 mg Oral QHS Connye Burkitt, NP   40 mg at 09/04/20 2116  . traZODone (DESYREL) tablet 100 mg  100 mg Oral QHS PRN Connye Burkitt, NP      . ziprasidone (GEODON) injection 20 mg  20 mg Intramuscular Once [name], MD       Current Outpatient Medications  Medication Sig Dispense Refill  . benztropine (COGENTIN) 2 MG tablet Take 1 tablet (2 mg total) by mouth 2 (two) times daily as needed for tremors (EPS). 15 tablet 0  . buPROPion (WELLBUTRIN XL) 150 MG 24 hr tablet Take 1  tablet (150 mg total) by mouth daily. 30 tablet 0  . divalproex (DEPAKOTE) 250 MG DR tablet Take 3 tablets (750 mg total) by mouth every 12 (twelve) hours. 180 tablet 0  . FLUoxetine (PROZAC) 40 MG capsule Take 1 capsule (40 mg total) by mouth daily. 30 capsule 0  . gabapentin (NEURONTIN) 400 MG capsule Take 1 capsule (400 mg total) by mouth 3 (three) times daily. 90 capsule 0  . hydrOXYzine (ATARAX/VISTARIL) 50 MG tablet Take 1 tablet (50 mg total) by mouth 3 (three) times daily as needed for anxiety. 90 tablet 0  . olanzapine zydis (ZYPREXA) 15 MG disintegrating tablet Take 2 tablets (30 mg total) by mouth at bedtime. (Patient not taking: Reported on 05/21/2020) 60 tablet 0  . pantoprazole (PROTONIX) 40 MG tablet Take 1 tablet (40 mg total) by mouth at bedtime. 30 tablet 0  . traZODone (DESYREL) 100 MG tablet Take 1 tablet (100 mg total) by mouth at bedtime as needed for sleep. 15 tablet 0    Psychiatric Specialty Exam: Physical Exam  Review of Systems  Blood pressure 112/74, pulse (!) 111, temperature 98.6 F (37 C), resp. rate 16, height 5\' 3"  (1.6 m), weight 77.1 kg, SpO2 98 %.Body mass index is 30.11 kg/m.  General Appearance: Casual  Eye Contact:  Good  Speech:  Normal Rate  Volume:  Normal  Mood:  Initially euthymic, becomes agitated when discharge mentioned  Affect:  initially flat, becomes agitated when discharge mentioned  Thought Process:  Coherent  Orientation:  Full (Time, Place, and Person)  Thought Content:  Delusions  Suicidal Thoughts:  No  Homicidal Thoughts:  No  Memory:  Immediate;   Fair Recent;   Fair Remote;   Fair  Judgement:  Poor  Insight:  Lacking  Psychomotor Activity:  Normal  Concentration:  Concentration: Fair and Attention Span: Fair  Recall:  [employer] of Knowledge:  Fair  Language:  Fair  Akathisia:  No  Handed:  Right  AIMS (if indicated):     Assets:  Communication Skills Desire for Improvement Financial Resources/Insurance Leisure  Time Social Support  ADL's:  Intact  Cognition:  WNL  Sleep:       Disposition: Patient with history of schizoaffective disorder, polysubstance use, and homelessness. She was just discharged from [facility] behavioral health unit on Thursday and was restarted on her medications in the ED yesterday. I am familiar with this patient from her numerous hospitalizations, ED and [place] visits, and she appears to be at her baseline and attempting to be hospitalized for  secondary gain of housing. Due to chronicity of symptoms she is unlikely to benefit from acute inpatient hospitalization and also unable to be accepted to inpatient unit due to being COVID positive. She shows no evidence of acute risk of harm to self or others and is psych cleared for discharge. ED staff updated.  This service was provided via telemedicine using a 2-way, interactive audio and video technology with the identified patient and this Probation officer.  Connye Burkitt, NP 09/05/2020 10:46 AM

## 2020-09-05 NOTE — ED Triage Notes (Signed)
Discharged earlier today, diagnosed with covid, states she is not feeling well, c/o chills

## 2020-09-05 NOTE — ED Notes (Signed)
ED Provider at bedside. 

## 2020-09-05 NOTE — ED Triage Notes (Signed)
States she is feeling bad from covid

## 2020-09-05 NOTE — Discharge Instructions (Addendum)
Drink plenty of fluids.  Take Tylenol or Motrin for aches and pains

## 2020-10-06 IMAGING — DX DG FOOT 2V*L*
2 series · 2 of 2 positions shown · non-contrast
Comparison: None.

CLINICAL DATA: Left dorsal foot pain, injury

EXAM:
LEFT FOOT - 2 VIEW

[foot ap]
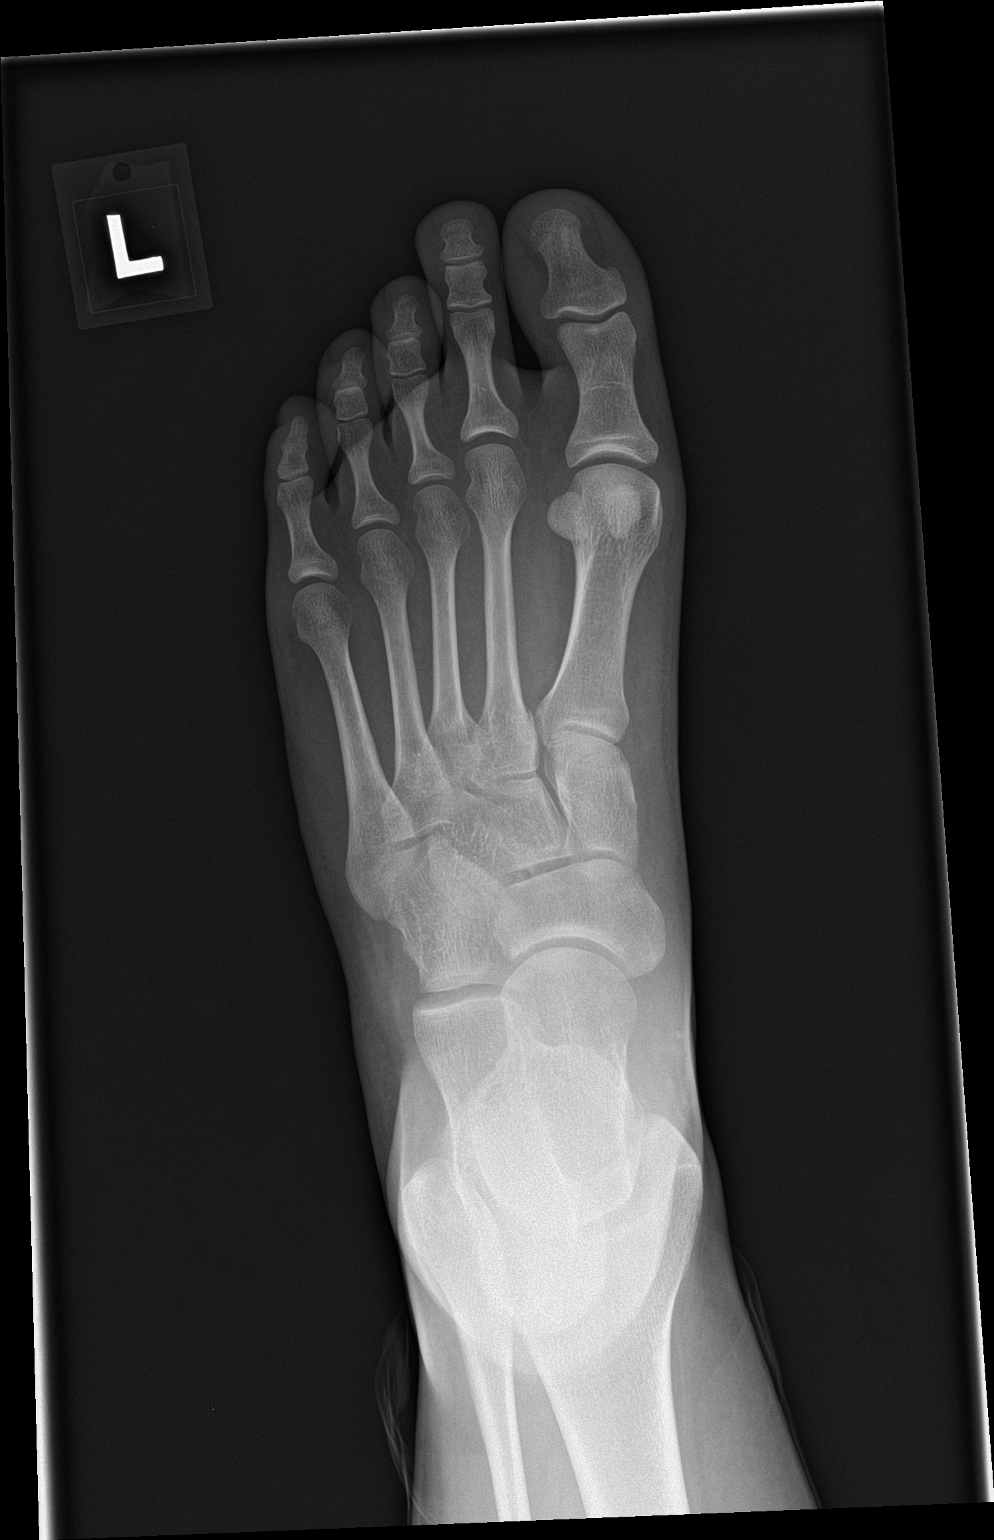

[foot lat]
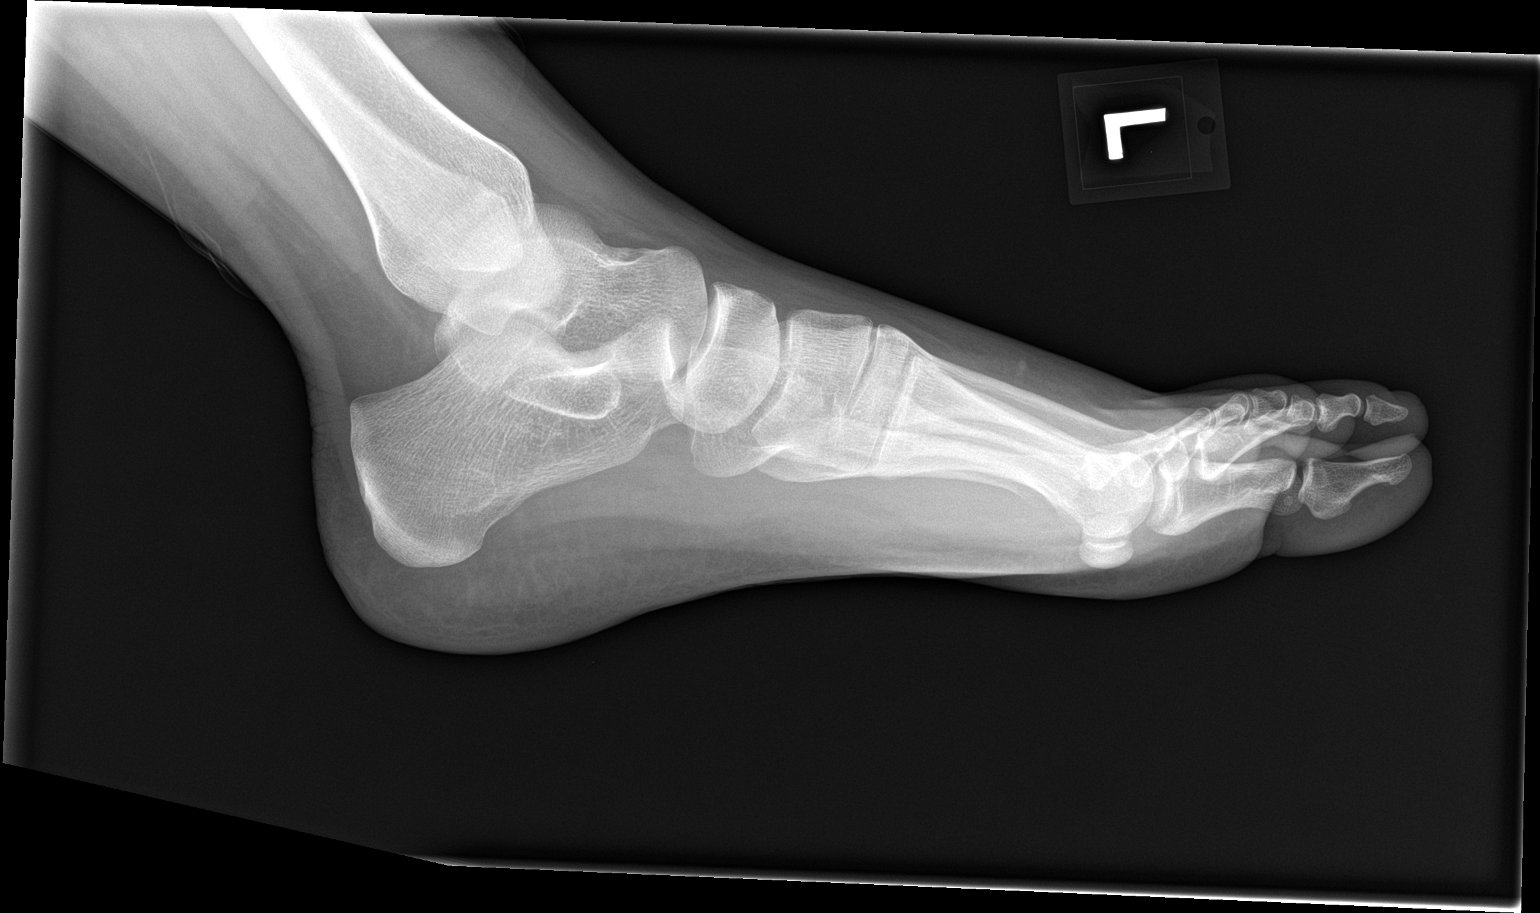

[2 of 2 positions shown; findings below may reference images not displayed]

FINDINGS: No definite fracture or dislocation of the left foot. There is a
subtle irregularity of the medial cortex of the base of the left
second metatarsal seen on AP view of the foot. Joint spaces are well
preserved. Soft tissues are unremarkable.
IMPRESSION: Subtle irregularity of the medial cortex of the base of the left
second metatarsal, suspicious for nondisplaced fracture. Correlate
for acute point tenderness. No definite fracture or dislocation of
the left foot.

## 2021-03-07 ENCOUNTER — Emergency Department (HOSPITAL_COMMUNITY)
Admission: EM | Admit: 2021-03-07 | Discharge: 2021-03-09 | Disposition: A | Discharge status: Other Acute Inpatient Hospital | Payer: Medicaid Other | Attending: Emergency Medicine | Admitting: Emergency Medicine

## 2021-03-07 ENCOUNTER — Encounter (HOSPITAL_COMMUNITY): Payer: Self-pay | Admitting: *Deleted

## 2021-03-07 ENCOUNTER — Other Ambulatory Visit: Payer: Self-pay

## 2021-03-07 DIAGNOSIS — F17210 Nicotine dependence, cigarettes, uncomplicated: Secondary | ICD-10-CM | POA: Insufficient documentation

## 2021-03-07 DIAGNOSIS — Y900 Blood alcohol level of less than 20 mg/100 ml: Secondary | ICD-10-CM | POA: Insufficient documentation

## 2021-03-07 DIAGNOSIS — F259 Schizoaffective disorder, unspecified: Secondary | ICD-10-CM | POA: Insufficient documentation

## 2021-03-07 DIAGNOSIS — Z20822 Contact with and (suspected) exposure to covid-19: Secondary | ICD-10-CM | POA: Insufficient documentation

## 2021-03-07 DIAGNOSIS — F32A Depression, unspecified: Secondary | ICD-10-CM | POA: Insufficient documentation

## 2021-03-07 DIAGNOSIS — Z79899 Other long term (current) drug therapy: Secondary | ICD-10-CM | POA: Insufficient documentation

## 2021-03-07 DIAGNOSIS — R45851 Suicidal ideations: Secondary | ICD-10-CM | POA: Insufficient documentation

## 2021-03-07 LAB — CBC WITH DIFFERENTIAL/PLATELET
Abs Immature Granulocytes: 0.03 10*3/uL (ref 0.00–0.07)
Basophils Absolute: 0 10*3/uL (ref 0.0–0.1)
Basophils Relative: 0 %
Eosinophils Absolute: 0 10*3/uL (ref 0.0–0.5)
Eosinophils Relative: 0 %
HCT: 40.8 % (ref 36.0–46.0)
Hemoglobin: 13.2 g/dL (ref 12.0–15.0)
Immature Granulocytes: 0 %
Lymphocytes Relative: 18 %
Lymphs Abs: 1.5 10*3/uL (ref 0.7–4.0)
MCH: 29.4 pg (ref 26.0–34.0)
MCHC: 32.4 g/dL (ref 30.0–36.0)
MCV: 90.9 fL (ref 80.0–100.0)
Monocytes Absolute: 0.5 10*3/uL (ref 0.1–1.0)
Monocytes Relative: 6 %
Neutro Abs: 6.2 10*3/uL (ref 1.7–7.7)
Neutrophils Relative %: 76 %
Platelets: 251 10*3/uL (ref 150–400)
RBC: 4.49 MIL/uL (ref 3.87–5.11)
RDW: 13.7 % (ref 11.5–15.5)
WBC: 8.2 10*3/uL (ref 4.0–10.5)
nRBC: 0 % (ref 0.0–0.2)

## 2021-03-07 LAB — POC URINE PREG, ED: Preg Test, Ur: NEGATIVE

## 2021-03-07 LAB — RAPID URINE DRUG SCREEN, HOSP PERFORMED
Amphetamines: NOT DETECTED
Barbiturates: NOT DETECTED
Benzodiazepines: NOT DETECTED
Cocaine: NOT DETECTED
Opiates: NOT DETECTED
Tetrahydrocannabinol: NOT DETECTED

## 2021-03-07 LAB — COMPREHENSIVE METABOLIC PANEL WITH GFR
ALT: 17 U/L (ref 0–44)
AST: 17 U/L (ref 15–41)
Albumin: 4.3 g/dL (ref 3.5–5.0)
Alkaline Phosphatase: 78 U/L (ref 38–126)
Anion gap: 9 (ref 5–15)
BUN: 9 mg/dL (ref 6–20)
CO2: 24 mmol/L (ref 22–32)
Calcium: 9.3 mg/dL (ref 8.9–10.3)
Chloride: 104 mmol/L (ref 98–111)
Creatinine, Ser: 0.75 mg/dL (ref 0.44–1.00)
GFR, Estimated: >60 mL/min
Glucose, Bld: 97 mg/dL (ref 70–99)
Potassium: 3.4 mmol/L — ABNORMAL LOW (ref 3.5–5.1)
Sodium: 137 mmol/L (ref 135–145)
Total Bilirubin: 0.5 mg/dL (ref 0.3–1.2)
Total Protein: 7.7 g/dL (ref 6.5–8.1)

## 2021-03-07 LAB — ACETAMINOPHEN LEVEL: Acetaminophen (Tylenol), Serum: <10 ug/mL — ABNORMAL LOW (ref 10–30)

## 2021-03-07 LAB — SALICYLATE LEVEL: Salicylate Lvl: <7 mg/dL — ABNORMAL LOW (ref 7.0–30.0)

## 2021-03-07 LAB — ETHANOL: Alcohol, Ethyl (B): <10 mg/dL

## 2021-03-07 LAB — LIPASE, BLOOD: Lipase: 29 U/L (ref 11–51)

## 2021-03-07 MED ORDER — ALUM & MAG HYDROXIDE-SIMETH 200-200-20 MG/5ML PO SUSP: 30.0000 mL | Freq: Four times a day (QID) | ORAL | Status: DC | PRN

## 2021-03-07 MED ORDER — ACETAMINOPHEN 325 MG PO TABS: 650.0000 mg | ORAL_TABLET | ORAL | Status: DC | PRN

## 2021-03-07 NOTE — ED Provider Notes (Signed)
[facility] EMERGENCY DEPARTMENT Provider Note   CSN: [id number] Arrival date & time: 03/07/21  1435     History Chief Complaint  Patient presents with   V70.1    Julia [name] is a 20 y.o. female with a past medical history significant for schizophrenia, substance abuse, and depression who presents to the ED due to depression and SI. Patient admits to [facility]; however she states she "doesn't want to do it". She also endorses HI, but "doesn't want to do it". Patient was released from jail after 5 months today and was brought to the ED for psych evaluation after her cousin was contacted by ACT. Patient notes she did not take her medications while in jail. Patient notes she has been having "seizures" because her brain has been "foggy". She also states she was told she had an aneurysm while in jail. She also notes she has "toxic shock syndrome". Patient denies any physical complaint at this time. Denies auditory/visual hallucinations. No treatment prior to arrival.   History obtained from patient and past medical records. No interpreter used during encounter.       Past Medical History:  Diagnosis Date   Burning with urination 05/03/2015   Contraceptive management 05/03/2015   Depression    Dysmenorrhea 12/30/2013   Heroin addiction ([place])    Menorrhagia 12/30/2013   Menstrual extraction 12/30/2013   Migraines    Schizophrenia ([place])    Social anxiety disorder 09/25/2016   Suicidal ideations    Vaginal odor 05/03/2015    Patient Active Problem List   Diagnosis Date Noted   Malingering 09/05/2020   Schizoaffective disorder ([place]) 05/08/2020   Schizophrenia, paranoid ([place])    Cocaine dependence with cocaine-induced psychotic disorder with complication [facility])    Social anxiety disorder 09/25/2016   Polysubstance abuse ([place])    Dysmenorrhea 12/30/2013    Past Surgical History:  Procedure Laterality Date   NO PAST SURGERIES       OB History     Gravida  0   Para      Term       Preterm      AB      Living         SAB      IAB      Ectopic      Multiple      Live Births              Family History  Problem Relation Age of Onset   Depression Mother    Hypertension Father    Hyperlipidemia Father    Cancer Paternal Grandmother        breast, uterine   Cirrhosis Paternal Grandfather        due to alcohol    Social History   Tobacco Use   Smoking status: Every Day    Packs/day: 1.00    Types: Cigarettes   Smokeless tobacco: Never  Vaping Use   Vaping Use: Never used  Substance Use Topics   Alcohol use: Not Currently   Drug use: Not Currently    Types: Marijuana, Oxycodone, Cocaine, Methamphetamines    Home Medications Prior to Admission medications   Medication Sig Start Date End Date Taking? Authorizing Provider  benztropine (COGENTIN) 2 MG tablet Take 1 tablet (2 mg total) by mouth 2 (two) times daily as needed for tremors (EPS). 05/17/20   Connye Burkitt, NP  buPROPion (WELLBUTRIN XL) 150 MG 24 hr tablet Take 1 tablet (150 mg total)  by mouth daily. 05/17/20   Connye Burkitt, NP  divalproex (DEPAKOTE) 250 MG DR tablet Take 3 tablets (750 mg total) by mouth every 12 (twelve) hours. 05/17/20   Connye Burkitt, NP  FLUoxetine (PROZAC) 40 MG capsule Take 1 capsule (40 mg total) by mouth daily. 05/17/20   Connye Burkitt, NP  gabapentin (NEURONTIN) 400 MG capsule Take 1 capsule (400 mg total) by mouth 3 (three) times daily. 05/17/20   Connye Burkitt, NP  hydrOXYzine (ATARAX/VISTARIL) 50 MG tablet Take 1 tablet (50 mg total) by mouth 3 (three) times daily as needed for anxiety. 05/17/20   Connye Burkitt, NP  olanzapine zydis (ZYPREXA) 15 MG disintegrating tablet Take 2 tablets (30 mg total) by mouth at bedtime. Patient not taking: Reported on 05/21/2020 05/17/20   Connye Burkitt, NP  pantoprazole (PROTONIX) 40 MG tablet Take 1 tablet (40 mg total) by mouth at bedtime. 05/17/20   Connye Burkitt, NP  traZODone (DESYREL) 100 MG tablet Take 1 tablet  (100 mg total) by mouth at bedtime as needed for sleep. 05/17/20   Connye Burkitt, NP    Allergies    Abilify [aripiprazole] and Zoloft [sertraline hcl]  Review of Systems   Review of Systems  Psychiatric/Behavioral:  Positive for behavioral problems, self-injury and suicidal ideas.   All other systems reviewed and are negative.  Physical Exam Updated Vital Signs BP 112/71   Pulse 94   Temp 98.2 F (36.8 C) (Oral)   Resp 20   LMP 02/28/2021   SpO2 100%   Physical Exam Vitals and nursing note reviewed.  Constitutional:      General: She is not in acute distress.    Appearance: She is not ill-appearing.  HENT:     Head: Normocephalic.  Eyes:     Pupils: Pupils are equal, round, and reactive to light.  Cardiovascular:     Rate and Rhythm: Normal rate and regular rhythm.     Pulses: Normal pulses.     Heart sounds: Normal heart sounds. No murmur heard.   No friction rub. No gallop.  Pulmonary:     Effort: Pulmonary effort is normal.     Breath sounds: Normal breath sounds.  Abdominal:     General: Abdomen is flat. There is no distension.     Palpations: Abdomen is soft.     Tenderness: There is abdominal tenderness. There is no guarding or rebound.     Comments: Diffuse abdominal tenderness without rebound or guarding.   Musculoskeletal:        General: Normal range of motion.     Cervical back: Neck supple.  Skin:    General: Skin is warm and dry.  Neurological:     General: No focal deficit present.     Mental Status: She is alert.  Psychiatric:        Mood and Affect: Mood normal. Affect is flat.        Behavior: Behavior normal.        Thought Content: Thought content includes suicidal ideation.    ED Results / Procedures / Treatments   Labs (all labs ordered are listed, but only abnormal results are displayed) Labs Reviewed  COMPREHENSIVE METABOLIC PANEL - Abnormal; Notable for the following components:      Result Value   Potassium 3.4 (*)    All other  components within normal limits  ACETAMINOPHEN LEVEL - Abnormal; Notable for the following components:   Acetaminophen (Tylenol), Serum <10 (*)  All other components within normal limits  SALICYLATE LEVEL - Abnormal; Notable for the following components:   Salicylate Lvl [id number] (*)    All other components within normal limits  RESP PANEL BY RT-PCR (FLU A&B, COVID) ARPGX2  ETHANOL  RAPID URINE DRUG SCREEN, HOSP PERFORMED  CBC WITH DIFFERENTIAL/PLATELET  LIPASE, BLOOD  POC URINE PREG, ED    EKG None  Radiology No results found.  Procedures Procedures   Medications Ordered in ED Medications - No data to display  ED Course  I have reviewed the triage vital signs and the nursing notes.  Pertinent labs & imaging results that were available during my care of the patient were reviewed by me and considered in my medical decision making (see chart for details).    MDM Rules/Calculators/A&P                           20 year old female presents to the ED due to SI and depression. Patient has a history of schizophrenia and was just released from jail today. She did not take her medications while in jail. Upon arrival, patient tachycardic at 114; however otherwise stable vitals. Patient in no acute distress. Medical clearance labs ordered.   CBC unremarkable. No leukocytosis. Normal hemoglobin. Acetaminophen, salicylate, and ethanol level WNL. Lipase normal. Doubt pancreatitis. Negative pregnancy test. UDS negative. EKG NSR with no signs of acute ischemia. Tachycardia resolved during patient's ED stay. No infectious symptoms to suggest infectious etiology. Patient has been medically cleared for TTS evaluation.   The patient has been placed in psychiatric observation due to the need to provide a safe environment for the patient while obtaining psychiatric consultation and evaluation, as well as ongoing medical and medication management to treat the patient's condition.  The patient has been  placed under full IVC at this time. Final Clinical Impression(s) / ED Diagnoses Final diagnoses:  Suicidal ideations    Rx / DC Orders ED Discharge Orders     None        Karie Kirks 03/07/21 2016    Milton Ferguson, MD 03/09/21 [phone]

## 2021-03-07 NOTE — ED Notes (Signed)
Pt states " Eyes are looking at me in the corner." Attempted to re-direct pt. Pt. Would not respond to this nurse.

## 2021-03-07 NOTE — ED Notes (Signed)
The Act Team Member following this patient's case is [name] with [facility] cell number [phone].  Patient is homeless and [name] states they want to help with home placement after discharge.   [name] reports the patient has been off her medication since the end of April 2022.

## 2021-03-07 NOTE — ED Triage Notes (Signed)
First cousin picked up pt  from [facility] jail (been in jail for about 5 months) and was contacted from [facility] request pt be brought to the ED for evaluation.  Pt with hx of Schizophrenia, pt admits to HI.  Denies SI.  Pt's hair is matted.  Reported that pt has not been on her medications since in jail.

## 2021-03-07 NOTE — ED Notes (Signed)
Pt wanded in triage by security.
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
## 2021-03-08 LAB — RESP PANEL BY RT-PCR (FLU A&B, COVID) ARPGX2
Influenza A by PCR: NEGATIVE
Influenza B by PCR: NEGATIVE
SARS Coronavirus 2 by RT PCR: NEGATIVE

## 2021-03-08 MED ORDER — ZIPRASIDONE MESYLATE 20 MG IM SOLR
10.0000 mg | Freq: Four times a day (QID) | INTRAMUSCULAR | Status: DC | PRN
Start: 2021-03-08 — End: 2021-03-08

## 2021-03-08 MED ORDER — PANTOPRAZOLE SODIUM 40 MG PO TBEC
40.0000 mg | DELAYED_RELEASE_TABLET | Freq: Every day | ORAL | Status: DC
  Administered 2021-03-08: 40 mg via ORAL
  Filled 2021-03-08: qty 1

## 2021-03-08 MED ORDER — BUSPIRONE HCL 5 MG PO TABS
7.5000 mg | ORAL_TABLET | Freq: Two times a day (BID) | ORAL | Status: DC
  Administered 2021-03-08: 7.5 mg via ORAL
  Filled 2021-03-08: qty 2

## 2021-03-08 MED ORDER — BUPROPION HCL ER (XL) 150 MG PO TB24: 150.0000 mg | ORAL_TABLET | Freq: Every day | ORAL | Status: DC

## 2021-03-08 MED ORDER — CARBAMAZEPINE ER 100 MG PO TB12
100.0000 mg | ORAL_TABLET | Freq: Two times a day (BID) | ORAL | Status: DC
  Administered 2021-03-08: 100 mg via ORAL
  Filled 2021-03-08 (×8): qty 1

## 2021-03-08 MED ORDER — FLUOXETINE HCL 20 MG PO CAPS: 40.0000 mg | ORAL_CAPSULE | Freq: Every day | ORAL | Status: DC

## 2021-03-08 MED ORDER — HYDROXYZINE HCL 25 MG PO TABS
50.0000 mg | ORAL_TABLET | Freq: Three times a day (TID) | ORAL | Status: DC | PRN
Start: 2021-03-08 — End: 2021-03-09

## 2021-03-08 MED ORDER — VENLAFAXINE HCL ER 37.5 MG PO CP24
150.0000 mg | ORAL_CAPSULE | Freq: Every day | ORAL | Status: DC
  Administered 2021-03-09: 150 mg via ORAL
  Filled 2021-03-08: qty 4

## 2021-03-08 MED ORDER — DIVALPROEX SODIUM 250 MG PO DR TAB
750.0000 mg | DELAYED_RELEASE_TABLET | Freq: Two times a day (BID) | ORAL | Status: DC
Start: 2021-03-08 — End: 2021-03-08
  Administered 2021-03-08: 750 mg via ORAL
  Filled 2021-03-08: qty 3

## 2021-03-08 MED ORDER — GABAPENTIN 400 MG PO CAPS: 400.0000 mg | ORAL_CAPSULE | Freq: Three times a day (TID) | ORAL | Status: DC

## 2021-03-08 MED ORDER — OLANZAPINE 5 MG PO TBDP
30.0000 mg | ORAL_TABLET | Freq: Every day | ORAL | Status: DC
  Administered 2021-03-08: 30 mg via ORAL
  Filled 2021-03-08: qty 6

## 2021-03-08 MED ORDER — TRAZODONE HCL 50 MG PO TABS: 100.0000 mg | ORAL_TABLET | Freq: Every evening | ORAL | Status: DC | PRN

## 2021-03-08 MED ORDER — BENZTROPINE MESYLATE 1 MG PO TABS: 2.0000 mg | ORAL_TABLET | Freq: Two times a day (BID) | ORAL | Status: DC | PRN

## 2021-03-08 NOTE — ED Notes (Signed)
Dinner tray placed on bedside table. Resting at this time.

## 2021-03-08 NOTE — ED Notes (Signed)
Per pharmacy, pt only taking 3 medications (buspar,tegretol, effexor) and to notify MD to adjust medications. MD made aware.

## 2021-03-08 NOTE — Progress Notes (Signed)
Patient information has been sent to [facility] via secure chat to review for potential admission. Patient meets inpatient criteria per Margorie John, PA-C.   Situation ongoing, CSW will continue to monitor progress.    Signed:  Mariea Clonts, MSW, LCSW-A  03/08/2021 9:33 AM
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
## 2021-03-08 NOTE — Progress Notes (Signed)
Pt accepted to [facility]  Patient meets inpatient criteria per Margorie John, PA.   Dr. Hermina Barters is the attending provider.    Call report to [phone]  Carney Bern, RN @ [facility] ED notified.     Pt scheduled  to arrive at [facility] at 0900 AM  Please fax IVC and Covid results to [phone]  Signed:  Durenda Hurt, MSW, [place], LCASA 03/08/2021 5:24 PM

## 2021-03-08 NOTE — ED Notes (Signed)
Lunch tray placed on bedside table. Resting at this time.

## 2021-03-08 NOTE — ED Notes (Signed)
Breakfast tray placed on bedside table. Resting at this time.

## 2021-03-08 NOTE — Progress Notes (Signed)
Patient has been denied by [facility] due to no bed availability. Patient meets inpatient criteria per Margorie John, PA-C. Patient referred to the following facilities:  [facility]  [address], [place] [address] [phone] [place]  [address] [place] [address] [phone]  [facility]  [address], [place] [address] [phone] [place]  [address] [place] [address] [phone] [facility]  [address], [place] [address] [phone] [facility]  [address], [place] [address] [phone]  [facility]  [address] [place], [place] [address] [phone]  [place] [place] [address] [phone] [facility]  [address], [place] [address] [phone]  [facility]  [address], [place] [address] [place]  [facility]  [address], [place] [address] [phone]    CSW will continue to monitor disposition.     Mariea Clonts, MSW, LCSW-A  2:54 PM 03/08/2021

## 2021-03-08 NOTE — Progress Notes (Signed)
Patient under review at [facility] at this time. Phone number for patients nurse provided to representative reviewing documentation. Situation ongoing, CSW will continue to monitor and update note as more information becomes available.    Signed:  Durenda Hurt, MSW, [place], LCASA 03/08/2021 5:15 PM

## 2021-03-08 NOTE — ED Notes (Signed)
Tye Maryland, RN, [facility] made aware of need for tegretol from [place]

## 2021-03-09 NOTE — ED Provider Notes (Signed)
  Physical Exam  BP 105/75 (BP Location: Left Arm)   Pulse 88   Temp 98 F (36.7 C) (Oral)   Resp 16   LMP 02/28/2021   SpO2 99%   Physical Exam  ED Course/Procedures     Procedures  MDM  Sleeping comfortably.  Pending transfer to old [place].       Davonna Belling, MD 03/09/21 [phone]

## 2021-03-09 NOTE — ED Notes (Signed)
Paperwork faxed to [employer] @ [phone] 03/09/2021

## 2021-03-09 NOTE — ED Notes (Signed)
Pt sleeping at this time- will obtain vital signs when pt awakes.

## 2021-03-09 NOTE — ED Notes (Signed)
Awaiting PD to talk pt to [facility]

## 2021-04-26 ENCOUNTER — Emergency Department (HOSPITAL_COMMUNITY)
Admission: EM | Admit: 2021-04-26 | Discharge: 2021-04-27 | Disposition: A | Discharge status: Home / Self Care | Payer: Medicaid Other | Attending: Emergency Medicine | Admitting: Emergency Medicine

## 2021-04-26 ENCOUNTER — Encounter (HOSPITAL_COMMUNITY): Payer: Self-pay | Admitting: Emergency Medicine

## 2021-04-26 DIAGNOSIS — F1999 Other psychoactive substance use, unspecified with unspecified psychoactive substance-induced disorder: Secondary | ICD-10-CM | POA: Diagnosis not present

## 2021-04-26 DIAGNOSIS — R443 Hallucinations, unspecified: Secondary | ICD-10-CM

## 2021-04-26 DIAGNOSIS — F17210 Nicotine dependence, cigarettes, uncomplicated: Secondary | ICD-10-CM | POA: Insufficient documentation

## 2021-04-26 DIAGNOSIS — F19180 Other psychoactive substance abuse with psychoactive substance-induced anxiety disorder: Secondary | ICD-10-CM | POA: Diagnosis not present

## 2021-04-26 DIAGNOSIS — F99 Mental disorder, not otherwise specified: Secondary | ICD-10-CM | POA: Diagnosis not present

## 2021-04-26 DIAGNOSIS — Z20822 Contact with and (suspected) exposure to covid-19: Secondary | ICD-10-CM | POA: Diagnosis not present

## 2021-04-26 DIAGNOSIS — F250 Schizoaffective disorder, bipolar type: Secondary | ICD-10-CM | POA: Insufficient documentation

## 2021-04-26 DIAGNOSIS — F1910 Other psychoactive substance abuse, uncomplicated: Secondary | ICD-10-CM | POA: Diagnosis present

## 2021-04-26 DIAGNOSIS — Z79899 Other long term (current) drug therapy: Secondary | ICD-10-CM | POA: Diagnosis not present

## 2021-04-26 DIAGNOSIS — F259 Schizoaffective disorder, unspecified: Secondary | ICD-10-CM | POA: Diagnosis present

## 2021-04-26 DIAGNOSIS — Z046 Encounter for general psychiatric examination, requested by authority: Secondary | ICD-10-CM | POA: Diagnosis present

## 2021-04-26 LAB — COMPREHENSIVE METABOLIC PANEL
ALT: 19 U/L (ref 0–44)
AST: 16 U/L (ref 15–41)
Albumin: 3.9 g/dL (ref 3.5–5.0)
Alkaline Phosphatase: 83 U/L (ref 38–126)
Anion gap: 7 (ref 5–15)
BUN: 12 mg/dL (ref 6–20)
CO2: 26 mmol/L (ref 22–32)
Calcium: 8.6 mg/dL — ABNORMAL LOW (ref 8.9–10.3)
Chloride: 103 mmol/L (ref 98–111)
Creatinine, Ser: 0.81 mg/dL (ref 0.44–1.00)
GFR, Estimated: >60 mL/min (ref >60)
Glucose, Bld: 89 mg/dL (ref 70–99)
Potassium: 3.7 mmol/L (ref 3.5–5.1)
Sodium: 136 mmol/L (ref 135–145)
Total Bilirubin: 0.4 mg/dL (ref 0.3–1.2)
Total Protein: 7.5 g/dL (ref 6.5–8.1)

## 2021-04-26 LAB — ACETAMINOPHEN LEVEL: Acetaminophen (Tylenol), Serum: <10 ug/mL — ABNORMAL LOW (ref 10–30)

## 2021-04-26 LAB — CBC WITH DIFFERENTIAL/PLATELET
Abs Immature Granulocytes: 0.03 10*3/uL (ref 0.00–0.07)
Basophils Absolute: 0 10*3/uL (ref 0.0–0.1)
Basophils Relative: 1 %
Eosinophils Absolute: 0.1 10*3/uL (ref 0.0–0.5)
Eosinophils Relative: 1 %
HCT: 41.1 % (ref 36.0–46.0)
Hemoglobin: 13.2 g/dL (ref 12.0–15.0)
Immature Granulocytes: 1 %
Lymphocytes Relative: 30 %
Lymphs Abs: 2 10*3/uL (ref 0.7–4.0)
MCH: 28.9 pg (ref 26.0–34.0)
MCHC: 32.1 g/dL (ref 30.0–36.0)
MCV: 89.9 fL (ref 80.0–100.0)
Monocytes Absolute: 0.7 10*3/uL (ref 0.1–1.0)
Monocytes Relative: 11 %
Neutro Abs: 3.8 10*3/uL (ref 1.7–7.7)
Neutrophils Relative %: 56 %
Platelets: 299 10*3/uL (ref 150–400)
RBC: 4.57 MIL/uL (ref 3.87–5.11)
RDW: 13.2 % (ref 11.5–15.5)
WBC: 6.6 10*3/uL (ref 4.0–10.5)
nRBC: 0 % (ref 0.0–0.2)

## 2021-04-26 LAB — ETHANOL: Alcohol, Ethyl (B): <10 mg/dL (ref <10)

## 2021-04-26 LAB — SALICYLATE LEVEL: Salicylate Lvl: <7 mg/dL — ABNORMAL LOW (ref 7.0–30.0)

## 2021-04-26 NOTE — ED Triage Notes (Signed)
Pt called EMS due to having auditory and visual hallucinations. Pt states she does take psych medications and has been taking her medications as ordered.

## 2021-04-26 NOTE — ED Notes (Addendum)
WANDED BY SECURITY . SITTING IN BED. DRESSED IN PURPLE SCRUBS

## 2021-04-27 ENCOUNTER — Encounter (HOSPITAL_COMMUNITY): Payer: Self-pay | Admitting: *Deleted

## 2021-04-27 ENCOUNTER — Emergency Department (HOSPITAL_COMMUNITY)
Admission: EM | Admit: 2021-04-27 | Discharge: 2021-04-28 | Disposition: A | Discharge status: Other Acute Inpatient Hospital | Payer: Medicaid Other | Source: Home / Self Care | Attending: Emergency Medicine | Admitting: Emergency Medicine

## 2021-04-27 DIAGNOSIS — F1999 Other psychoactive substance use, unspecified with unspecified psychoactive substance-induced disorder: Secondary | ICD-10-CM

## 2021-04-27 DIAGNOSIS — F250 Schizoaffective disorder, bipolar type: Secondary | ICD-10-CM | POA: Insufficient documentation

## 2021-04-27 DIAGNOSIS — F17210 Nicotine dependence, cigarettes, uncomplicated: Secondary | ICD-10-CM | POA: Insufficient documentation

## 2021-04-27 DIAGNOSIS — Z20822 Contact with and (suspected) exposure to covid-19: Secondary | ICD-10-CM | POA: Insufficient documentation

## 2021-04-27 DIAGNOSIS — F99 Mental disorder, not otherwise specified: Secondary | ICD-10-CM | POA: Insufficient documentation

## 2021-04-27 LAB — RAPID URINE DRUG SCREEN, HOSP PERFORMED
Amphetamines: POSITIVE — AB
Barbiturates: NOT DETECTED
Benzodiazepines: POSITIVE — AB
Cocaine: NOT DETECTED
Opiates: NOT DETECTED
Tetrahydrocannabinol: NOT DETECTED

## 2021-04-27 LAB — PREGNANCY, URINE: Preg Test, Ur: NEGATIVE

## 2021-04-27 MED ORDER — HYDROXYZINE HCL 25 MG PO TABS
50.0000 mg | ORAL_TABLET | Freq: Two times a day (BID) | ORAL | Status: DC
  Administered 2021-04-27 (×2): 50 mg via ORAL
  Filled 2021-04-27 (×2): qty 2

## 2021-04-27 MED ORDER — TRAZODONE HCL 50 MG PO TABS
100.0000 mg | ORAL_TABLET | Freq: Every day | ORAL | Status: DC
  Administered 2021-04-27: 100 mg via ORAL
  Filled 2021-04-27: qty 2

## 2021-04-27 MED ORDER — FLUPHENAZINE HCL 5 MG PO TABS
10.0000 mg | ORAL_TABLET | Freq: Every day | ORAL | Status: DC
  Filled 2021-04-27 (×4): qty 1

## 2021-04-27 MED ORDER — LORAZEPAM 1 MG PO TABS: 1.0000 mg | ORAL_TABLET | ORAL | Status: DC | PRN

## 2021-04-27 MED ORDER — LORAZEPAM 1 MG PO TABS: 1.0000 mg | ORAL_TABLET | Freq: Once | ORAL | Status: DC

## 2021-04-27 MED ORDER — HYDROXYZINE HCL 25 MG PO TABS
50.0000 mg | ORAL_TABLET | Freq: Two times a day (BID) | ORAL | Status: DC
  Administered 2021-04-27 – 2021-04-28 (×2): 50 mg via ORAL
  Filled 2021-04-27 (×2): qty 2

## 2021-04-27 MED ORDER — FLUPHENAZINE HCL 5 MG PO TABS
10.0000 mg | ORAL_TABLET | Freq: Every day | ORAL | Status: DC
  Filled 2021-04-27 (×3): qty 1

## 2021-04-27 MED ORDER — TRAZODONE HCL 50 MG PO TABS: 100.0000 mg | ORAL_TABLET | Freq: Every day | ORAL | Status: DC
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
## 2021-04-27 NOTE — Consult Note (Signed)
Telepsych Consultation   Reason for Consult:  psych consult  Referring Physician:  Ripley Fraise Location of Patient: [id number] Location of Provider: [place] Department  Patient Identification: Julia [name] MRN:  [id number] Principal Diagnosis: Substance-induced disorder [facility]) Diagnosis:  Principal Problem:   Substance-induced disorder ([place]) Active Problems:   Polysubstance abuse ([place])   Schizoaffective disorder ([place])   Total Time spent with patient: 20 minutes  Subjective:   Julia [name] is a 20 y.o. female patient admitted with auditory and visual hallucinations.  Patient presents alert and oriented; states she came into the hospital for "seeing and hearing stuff". Denies any substance use; UDS+amphetamine, benzo. BAL<10. States she was released from hospital x1 week ago.  Patient then states she "only did a "little bit" regarding meth and says the benzodiazepine is from the hospital "last time". States she is currently living with a friend; denies any safety concerns. Refused to provided any contact information at this time.   She denies any suicidal or homicidal ideations, auditory or visual hallucinations at this time. Declines any substance abuse treatment at this time. She is alert and oriented; not observed responding to any external/internal stimuli at the time of this assessment. Provided verbal permission to speak to [name] [facility]).   Collateral: [place] patient was discharged from team while she was in jail recently and had been outside of encatchment area but is willing to add patient back to team. States patient was living with friend "Milus Banister" and spoke with her this week; says despite patient not being actively on caseload she does continue to follow her and assist when needed. States patient has been in and out of hospitals, jails, and on/off substances over the past year. Denies any immediate safety concerns at  this time.    Past Psychiatric History: schizoaffective disorder, polysubstance abuse  Risk to Self:  pt denies Risk to Others:  pt denies Prior Inpatient Therapy:  yes Prior Outpatient Therapy:  yes  Past Medical History:  Past Medical History:  Diagnosis Date   Burning with urination 05/03/2015   Contraceptive management 05/03/2015   Depression    Dysmenorrhea 12/30/2013   Heroin addiction ([place])    Menorrhagia 12/30/2013   Menstrual extraction 12/30/2013   Migraines    Schizophrenia ([place])    Social anxiety disorder 09/25/2016   Suicidal ideations    Vaginal odor 05/03/2015    Past Surgical History:  Procedure Laterality Date   NO PAST SURGERIES     Family History:  Family History  Problem Relation Age of Onset   Depression Mother    Hypertension Father    Hyperlipidemia Father    Cancer Paternal Grandmother        breast, uterine   Cirrhosis Paternal Grandfather        due to alcohol   Family Psychiatric  History: not noted Social History:  Social History   Substance and Sexual Activity  Alcohol Use Not Currently     Social History   Substance and Sexual Activity  Drug Use Not Currently   Types: Marijuana, Oxycodone, Cocaine, Methamphetamines    Social History   Socioeconomic History   Marital status: Single    Spouse name: Not on file   Number of children: Not on file   Years of education: Not on file   Highest education level: Not on file  Occupational History   Occupation: Unemployed  Tobacco Use   Smoking status: Every Day  Packs/day: 1.00    Types: Cigarettes   Smokeless tobacco: Never  Vaping Use   Vaping Use: Never used  Substance and Sexual Activity   Alcohol use: Not Currently   Drug use: Not Currently    Types: Marijuana, Oxycodone, Cocaine, Methamphetamines   Sexual activity: Not on file  Other Topics Concern   Not on file  Social History Narrative   06/23/2019:  Pt stated that she is homeless, that she is a high school  graduate, and that she is unemployed and not followed by any outpatient provider.      Lives with Dad. Mom has LGD, lives in [place]. 11th grader. Dog.   Social Determinants of Health   Financial Resource Strain: Not on file  Food Insecurity: Not on file  Transportation Needs: Not on file  Physical Activity: Not on file  Stress: Not on file  Social Connections: Not on file   Additional Social History:    Allergies:   Allergies  Allergen Reactions   Abilify [Aripiprazole]    Tylenol [Acetaminophen] Other (See Comments)    Bumps on skin/bottoms of feet   Zoloft [Sertraline Hcl]     Labs:  Results for orders placed or performed during the hospital encounter of 04/26/21 (from the past 48 hour(s))  Pregnancy, urine     Status: None   Collection Time: 04/26/21  9:31 AM  Result Value Ref Range   Preg Test, Ur NEGATIVE NEGATIVE    Comment:        THE SENSITIVITY OF THIS METHODOLOGY IS >20 mIU/mL. Performed at [facility], [address] [place], [place] [address]   Urine rapid drug screen (hosp performed)     Status: Abnormal   Collection Time: 04/26/21  9:31 AM  Result Value Ref Range   Opiates NONE DETECTED NONE DETECTED   Cocaine NONE DETECTED NONE DETECTED   Benzodiazepines POSITIVE (A) NONE DETECTED   Amphetamines POSITIVE (A) NONE DETECTED   Tetrahydrocannabinol NONE DETECTED NONE DETECTED   Barbiturates NONE DETECTED NONE DETECTED    Comment: (NOTE) DRUG SCREEN FOR MEDICAL PURPOSES ONLY.  IF CONFIRMATION IS NEEDED FOR ANY PURPOSE, NOTIFY LAB WITHIN 5 DAYS.  LOWEST DETECTABLE LIMITS FOR URINE DRUG SCREEN Drug Class                     Cutoff (ng/mL) Amphetamine and metabolites    1000 Barbiturate and metabolites    200 Benzodiazepine                 220 Tricyclics and metabolites     300 Opiates and metabolites        300 Cocaine and metabolites        300 THC                            50 Performed at [facility], [address] [place], [place] [address]    Comprehensive metabolic panel     Status: Abnormal   Collection Time: 04/26/21 11:08 PM  Result Value Ref Range   Sodium 136 135 - 145 mmol/L   Potassium 3.7 3.5 - 5.1 mmol/L   Chloride 103 98 - 111 mmol/L   CO2 26 22 - 32 mmol/L   Glucose, Bld 89 70 - 99 mg/dL    Comment: Glucose reference range applies only to samples taken after fasting for at least 8 hours.   BUN 12 6 - 20 mg/dL   Creatinine,  Ser 0.81 0.44 - 1.00 mg/dL   Calcium 8.6 (L) 8.9 - 10.3 mg/dL   Total Protein 7.5 6.5 - 8.1 g/dL   Albumin 3.9 3.5 - 5.0 g/dL   AST 16 15 - 41 U/L   ALT 19 0 - 44 U/L   Alkaline Phosphatase 83 38 - 126 U/L   Total Bilirubin 0.4 0.3 - 1.2 mg/dL   GFR, Estimated >60 >60 mL/min    Comment: (NOTE) Calculated using the CKD-EPI Creatinine Equation (2021)    Anion gap 7 5 - 15    Comment: Performed at [facility], [address], [place], [place] [address]  Acetaminophen level     Status: Abnormal   Collection Time: 04/26/21 11:08 PM  Result Value Ref Range   Acetaminophen (Tylenol), Serum <10 (L) 10 - 30 ug/mL    Comment: (NOTE) Therapeutic concentrations vary significantly. A range of 10-30 ug/mL  may be an effective concentration for many patients. However, some  are best treated at concentrations outside of this range. Acetaminophen concentrations >150 ug/mL at 4 hours after ingestion  and >50 ug/mL at 12 hours after ingestion are often associated with  toxic reactions.  Performed at [facility], [address], [place], [place] [address]   Ethanol     Status: None   Collection Time: 04/26/21 11:08 PM  Result Value Ref Range   Alcohol, Ethyl (B) <10 <10 mg/dL    Comment: (NOTE) Lowest detectable limit for serum alcohol is 10 mg/dL.  For medical purposes only. Performed at [facility], [address], [place], [place] [address]   Salicylate level     Status: Abnormal   Collection Time: 04/26/21 11:08 PM  Result Value Ref Range   Salicylate Lvl <6.2 (L) 7.0 - 30.0 mg/dL     Comment: Performed at [facility], [address], [place], [place] [address]  CBC with Differential     Status: None   Collection Time: 04/26/21 11:08 PM  Result Value Ref Range   WBC 6.6 4.0 - 10.5 K/uL   RBC 4.57 3.87 - 5.11 MIL/uL   Hemoglobin 13.2 12.0 - 15.0 g/dL   HCT 41.1 36.0 - 46.0 %   MCV 89.9 80.0 - 100.0 fL   MCH 28.9 26.0 - 34.0 pg   MCHC 32.1 30.0 - 36.0 g/dL   RDW 13.2 11.5 - 15.5 %   Platelets 299 150 - 400 K/uL   nRBC 0.0 0.0 - 0.2 %   Neutrophils Relative % 56 %   Neutro Abs 3.8 1.7 - 7.7 K/uL   Lymphocytes Relative 30 %   Lymphs Abs 2.0 0.7 - 4.0 K/uL   Monocytes Relative 11 %   Monocytes Absolute 0.7 0.1 - 1.0 K/uL   Eosinophils Relative 1 %   Eosinophils Absolute 0.1 0.0 - 0.5 K/uL   Basophils Relative 1 %   Basophils Absolute 0.0 0.0 - 0.1 K/uL   Immature Granulocytes 1 %   Abs Immature Granulocytes 0.03 0.00 - 0.07 K/uL    Comment: Performed at [facility], [address], [place], [place] [address]    Medications:  Current Facility-Administered Medications  Medication Dose Route Frequency Provider Last Rate Last Admin   fluPHENAZine (PROLIXIN) tablet 10 mg  10 mg Oral QHS Ripley Fraise, MD       hydrOXYzine (ATARAX/VISTARIL) tablet 50 mg  50 mg Oral BID Ripley Fraise, MD   50 mg at 04/27/21 1042   LORazepam (ATIVAN) tablet 1 mg  1 mg Oral Q4H  PRN Ripley Fraise, MD       traZODone (DESYREL) tablet 100 mg  100 mg Oral QHS Ripley Fraise, MD       Current Outpatient Medications  Medication Sig Dispense Refill   fluPHENAZine (PROLIXIN) 5 MG tablet Take 10 mg by mouth at bedtime.     hydrOXYzine (ATARAX/VISTARIL) 50 MG tablet Take 50 mg by mouth 2 (two) times daily.     traZODone (DESYREL) 100 MG tablet Take 100 mg by mouth at bedtime.      Musculoskeletal: Strength & Muscle Tone: within normal limits Gait & Station: normal Patient leans: N/A   Psychiatric Specialty Exam:  Presentation  General Appearance: Appropriate for  Environment; Disheveled  Eye Contact:Fair  Speech:Clear and Coherent; Normal Rate  Speech Volume:Decreased  Handedness:Right   Mood and Affect  Mood:Anxious  Affect:Flat   Thought Process  Thought Processes:Coherent; Goal Directed  Descriptions of Associations:Intact  Orientation:Full (Time, Place and Person)  Thought Content:WDL  History of Schizophrenia/Schizoaffective disorder:Yes  Duration of Psychotic Symptoms:Greater than six months  Hallucinations:No data recorded Ideas of Reference:Delusions  Suicidal Thoughts:No data recorded Homicidal Thoughts:No data recorded  Sensorium  Memory:Immediate Fair; Recent Fair; Remote Fair  Judgment:Fair  Insight:Lacking   Executive Functions  Concentration:Fair  Attention Span:Fair  [place]   Psychomotor Activity  Psychomotor Activity: No data recorded  Assets  Assets:Communication Skills; Desire for Improvement; Financial Resources/Insurance; Social Support   Sleep  Sleep: No data recorded   Physical Exam: Physical Exam Vitals and nursing note reviewed.   ROS Blood pressure 115/79, pulse 70, temperature 98 F (36.7 C), temperature source Oral, resp. rate 16, height 5\' 3"  (1.6 m), weight 81.6 kg, SpO2 98 %. Body mass index is 31.87 kg/m.  Treatment Plan Summary: Plan discharge patient with plan to follow up with provided outpatient services until ACTT services are fully transferred to current area.   Disposition: No evidence of imminent risk to self or others at present.   Patient does not meet criteria for psychiatric inpatient admission. Supportive therapy provided about ongoing stressors. Discussed crisis plan, support from social network, calling 911, coming to the Emergency Department, and calling Suicide Hotline.  This service was provided via telemedicine using a 2-way, interactive audio and video technology.  Names of all persons  participating in this telemedicine service and their role in this encounter. Name: Hampton Abbot Role: Attending MD  Name: Oneida Alar Role: [place]  Name: [name] Role: patient  Name: [name] Role: [facility] Team    Inda Merlin, NP 04/27/2021 12:54 PM

## 2021-04-27 NOTE — ED Notes (Signed)
Pt 2 bags of belongings and medications returned back to patient.

## 2021-04-27 NOTE — Discharge Instructions (Signed)
For your behavioral health needs, you are advised to follow up with an outpatient provider.  You may be eligible for ACT Team services, which would include frequent visits with your provider, as well as in-home services.  If you would like to re-establish your ACT Team services through [facility], contact [name] [phone]) to see if you are still eligible.  Otherwise, the following providers offer ACT Team services to residents of [facility].  Contact them at your earliest opportunity to ask about enrolling in their program:       [facility]      [address]      [place], [place] [address]      [phone]       [facility] [address], [place] [address]      [phone]

## 2021-04-27 NOTE — ED Notes (Signed)
Patient states she is suicidal with a plan. When asked what her plan would be she states "well, I want to hurt other people." I asked her which people she wanted to hurt and she stated, " the women over there." When I asked her if she had a plan of how she hurt the women she stated, " I would pour kerosene on them."
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
## 2021-04-27 NOTE — ED Notes (Signed)
Pt given soda and bagged lunch.

## 2021-04-27 NOTE — ED Notes (Signed)
Pt changed into hospital provided clothes. Pts belongings locked up.

## 2021-04-27 NOTE — BH Assessment (Signed)
[place] Assessment Progress Note   Per Oneida Alar, NP, this pt does not require psychiatric hospitalization at this time.  Pt is psychiatrically cleared.  Discharge instructions include referral information for pt's former ACT Team provider [name]), as well as other ACT Team providers for the [facility] area.  Pt's nurse, Judye Bos, has been notified.  Jalene Mullet, [place] Triage Specialist [phone]

## 2021-04-27 NOTE — ED Notes (Signed)
Meal provided to the patient
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
## 2021-04-27 NOTE — ED Notes (Signed)
Dietary contacted for missing food tray.

## 2021-04-27 NOTE — ED Provider Notes (Signed)
[facility] EMERGENCY DEPARTMENT Provider Note   CSN: [id number] Arrival date & time: 04/27/21  1510     History Chief Complaint  Patient presents with   V70.1    Julia [name] is a 20 y.o. female.  Patient brought in back to the ER by police under emergency hold.  Police report that this morning she had come to the police department stating that people were trying to kill her and that she wanted to kill them and return.  Would not specify exactly who.  Patient had recently been seen overnight in the ER evaluated by psychiatry and discharged home.  She presents back to the ER in police custody today.  No reports of headache or chest pain abdominal pain.  No reports of fevers or cough or vomiting or diarrhea.      Past Medical History:  Diagnosis Date   Burning with urination 05/03/2015   Contraceptive management 05/03/2015   Depression    Dysmenorrhea 12/30/2013   Heroin addiction ([place])    Menorrhagia 12/30/2013   Menstrual extraction 12/30/2013   Migraines    Schizophrenia ([place])    Social anxiety disorder 09/25/2016   Suicidal ideations    Vaginal odor 05/03/2015    Patient Active Problem List   Diagnosis Date Noted   Substance-induced disorder ([place]) 04/27/2021   Malingering 09/05/2020   Schizoaffective disorder ([place]) 05/08/2020   Schizophrenia, paranoid ([place])    Cocaine dependence with cocaine-induced psychotic disorder with complication [facility])    Social anxiety disorder 09/25/2016   Polysubstance abuse ([place])    Dysmenorrhea 12/30/2013    Past Surgical History:  Procedure Laterality Date   NO PAST SURGERIES       OB History     Gravida  0   Para      Term      Preterm      AB      Living         SAB      IAB      Ectopic      Multiple      Live Births              Family History  Problem Relation Age of Onset   Depression Mother    Hypertension Father    Hyperlipidemia Father    Cancer Paternal Grandmother        breast, uterine    Cirrhosis Paternal Grandfather        due to alcohol    Social History   Tobacco Use   Smoking status: Every Day    Packs/day: 1.00    Types: Cigarettes   Smokeless tobacco: Never  Vaping Use   Vaping Use: Never used  Substance Use Topics   Alcohol use: Not Currently   Drug use: Not Currently    Types: Marijuana, Oxycodone, Cocaine, Methamphetamines    Home Medications Prior to Admission medications   Medication Sig Start Date End Date Taking? Authorizing Provider  fluPHENAZine (PROLIXIN) 5 MG tablet Take 10 mg by mouth at bedtime. 04/18/21  Yes [provider]  hydrOXYzine (ATARAX/VISTARIL) 50 MG tablet Take 50 mg by mouth 2 (two) times daily. 04/18/21  Yes [provider]  traZODone (DESYREL) 100 MG tablet Take 100 mg by mouth at bedtime. 04/18/21  Yes [provider]    Allergies    Abilify [aripiprazole], Tylenol [acetaminophen], and Zoloft [sertraline hcl]  Review of Systems   Review of Systems  Constitutional:  Negative for fever.  HENT:  Negative for ear pain.   Eyes:  Negative for pain.  Respiratory:  Negative for cough.   Cardiovascular:  Negative for chest pain.  Gastrointestinal:  Negative for abdominal pain.  Genitourinary:  Negative for flank pain.  Musculoskeletal:  Negative for back pain.  Skin:  Negative for rash.  Neurological:  Negative for headaches.  Psychiatric/Behavioral:  Positive for hallucinations.    Physical Exam Updated Vital Signs BP 105/76   Pulse (!) 111   Temp 98.6 F (37 C)   Resp 20   SpO2 92%   Physical Exam Constitutional:      General: She is not in acute distress.    Appearance: Normal appearance.  HENT:     Head: Normocephalic.     Nose: Nose normal.  Eyes:     Extraocular Movements: Extraocular movements intact.  Cardiovascular:     Rate and Rhythm: Normal rate.  Pulmonary:     Effort: Pulmonary effort is normal.  Musculoskeletal:        General: Normal range of motion.     Cervical  back: Normal range of motion.  Neurological:     General: No focal deficit present.     Mental Status: She is alert. Mental status is at baseline.    ED Results / Procedures / Treatments   Labs (all labs ordered are listed, but only abnormal results are displayed) Labs Reviewed - No data to display  EKG None  Radiology No results found.  Procedures Procedures   Medications Ordered in ED Medications - No data to display  ED Course  I have reviewed the triage vital signs and the nursing notes.  Pertinent labs & imaging results that were available during my care of the patient were reviewed by me and considered in my medical decision making (see chart for details).    MDM Rules/Calculators/A&P                           Requesting TTS service to evaluate patient.   Final Clinical Impression(s) / ED Diagnoses Final diagnoses:  Psychiatric illness    Rx / DC Orders ED Discharge Orders     None        Luna Fuse, MD 04/27/21 1910

## 2021-04-27 NOTE — ED Provider Notes (Signed)
[facility] EMERGENCY DEPARTMENT Provider Note   CSN: [id number] Arrival date & time: 04/26/21  2216     History Chief Complaint  Patient presents with   Hallucinations    Julia [name] is a 20 y.o. female.  The history is provided by the patient.  Mental Health Problem Presenting symptoms: hallucinations and suicidal thoughts   Degree of incapacity (severity):  Moderate Onset quality:  Gradual Timing:  Constant Progression:  Worsening Chronicity:  New Relieved by:  None tried Worsened by:  Nothing Associated symptoms: no abdominal pain, no chest pain and no headaches   Patient with history of depression presents with hallucinations.  Patient reports she has had auditory and visual hallucinations.  She called EMS to bring her to the hospital.  She reports medication compliance.  She reports she has had thoughts of "blowing my brains out"    Past Medical History:  Diagnosis Date   Burning with urination 05/03/2015   Contraceptive management 05/03/2015   Depression    Dysmenorrhea 12/30/2013   Heroin addiction ([place])    Menorrhagia 12/30/2013   Menstrual extraction 12/30/2013   Migraines    Schizophrenia ([place])    Social anxiety disorder 09/25/2016   Suicidal ideations    Vaginal odor 05/03/2015    Patient Active Problem List   Diagnosis Date Noted   Malingering 09/05/2020   Schizoaffective disorder ([place]) 05/08/2020   Schizophrenia, paranoid ([place])    Cocaine dependence with cocaine-induced psychotic disorder with complication [facility])    Social anxiety disorder 09/25/2016   Polysubstance abuse ([place])    Dysmenorrhea 12/30/2013    Past Surgical History:  Procedure Laterality Date   NO PAST SURGERIES       OB History     Gravida  0   Para      Term      Preterm      AB      Living         SAB      IAB      Ectopic      Multiple      Live Births              Family History  Problem Relation Age of Onset   Depression Mother     Hypertension Father    Hyperlipidemia Father    Cancer Paternal Grandmother        breast, uterine   Cirrhosis Paternal Grandfather        due to alcohol    Social History   Tobacco Use   Smoking status: Every Day    Packs/day: 1.00    Types: Cigarettes   Smokeless tobacco: Never  Vaping Use   Vaping Use: Never used  Substance Use Topics   Alcohol use: Not Currently   Drug use: Not Currently    Types: Marijuana, Oxycodone, Cocaine, Methamphetamines    Home Medications Prior to Admission medications   Medication Sig Start Date End Date Taking? Authorizing Provider  busPIRone (BUSPAR) 7.5 MG tablet Take 7.5 mg by mouth 2 (two) times daily.    [provider]  carbamazepine (TEGRETOL XR) 100 MG 12 hr tablet Take 100 mg by mouth 2 (two) times daily.    [provider]  venlafaxine XR (EFFEXOR-XR) 150 MG 24 hr capsule Take 150 mg by mouth daily with breakfast.    [provider]    Allergies    Abilify [aripiprazole], Tylenol [acetaminophen], and Zoloft [sertraline hcl]  Review of Systems  Review of Systems  Constitutional:  Negative for fever.  Cardiovascular:  Negative for chest pain.  Gastrointestinal:  Negative for abdominal pain.  Neurological:  Negative for weakness and headaches.  Psychiatric/Behavioral:  Positive for hallucinations and suicidal ideas.   All other systems reviewed and are negative.  Physical Exam Updated Vital Signs BP 110/76 (BP Location: Left Arm)   Pulse (!) 114   Temp 98.1 F (36.7 C)   Resp 18   Ht 1.6 m (5\' 3" )   Wt 81.6 kg   SpO2 99%   BMI 31.87 kg/m   Physical Exam CONSTITUTIONAL: Well developed/well nourished, anxious HEAD: Normocephalic/atraumatic EYES: EOMI/PERRL ENMT: Mucous membranes moist NECK: supple no meningeal signs CV: S1/S2 noted, no murmurs/rubs/gallops noted LUNGS: Lungs are clear to auscultation bilaterally, no apparent distress ABDOMEN: soft, nontender NEURO: Pt is  awake/alert/appropriate, moves all extremitiesx4.  No facial droop.   EXTREMITIES: pulses normal/equal, full ROM SKIN: warm, color normal PSYCH: Anxious ED Results / Procedures / Treatments   Labs (all labs ordered are listed, but only abnormal results are displayed) Labs Reviewed  COMPREHENSIVE METABOLIC PANEL - Abnormal; Notable for the following components:      Result Value   Calcium 8.6 (*)    All other components within normal limits  ACETAMINOPHEN LEVEL - Abnormal; Notable for the following components:   Acetaminophen (Tylenol), Serum <10 (*)    All other components within normal limits  SALICYLATE LEVEL - Abnormal; Notable for the following components:   Salicylate Lvl <0.9 (*)    All other components within normal limits  RESP PANEL BY RT-PCR (FLU A&B, COVID) ARPGX2  ETHANOL  CBC WITH DIFFERENTIAL/PLATELET  PREGNANCY, URINE  URINALYSIS, ROUTINE W REFLEX MICROSCOPIC  RAPID URINE DRUG SCREEN, HOSP PERFORMED    EKG None  Radiology No results found.  Procedures Procedures   Medications Ordered in ED Medications - No data to display  ED Course  I have reviewed the triage vital signs and the nursing notes.  Pertinent labs results that were available during my care of the patient were reviewed by me and considered in my medical decision making (see chart for details).    MDM Rules/Calculators/A&P                           Patient medically stable.  She is here voluntarily.  She will need to have her medication list updated  The patient has been placed in psychiatric observation due to the need to provide a safe environment for the patient while obtaining psychiatric consultation and evaluation, as well as ongoing medical and medication management to treat the patient's condition.  The patient has not been placed under full IVC at this time.  Final Clinical Impression(s) / ED Diagnoses Final diagnoses:  Hallucinations    Rx / DC Orders ED Discharge Orders      None        Ripley Fraise, MD 04/27/21 0117

## 2021-04-27 NOTE — ED Triage Notes (Signed)
Patient went to [facility] and states someone was after her, just released

## 2021-04-27 NOTE — ED Notes (Signed)
Medications updated

## 2021-04-28 ENCOUNTER — Other Ambulatory Visit: Payer: Self-pay

## 2021-04-28 LAB — RESP PANEL BY RT-PCR (FLU A&B, COVID) ARPGX2
Influenza A by PCR: NEGATIVE
Influenza B by PCR: NEGATIVE
SARS Coronavirus 2 by RT PCR: NEGATIVE

## 2021-04-28 NOTE — ED Notes (Signed)
Attempted report x1 to [phone].

## 2021-04-28 NOTE — ED Notes (Signed)
ED TO INPATIENT HANDOFF REPORT  ED Nurse Name and Phone #:   S Name/Age/Gender [name] 20 y.o. female Room/Bed: APA17/APA17  Code Status   Code Status: Prior  Home/SNF/Other Home Patient oriented to: self, place, time, and situation Is this baseline? Yes   Triage Complete: Triage complete  Chief Complaint Medical Clearance  Triage Note Patient went to [facility] and states someone was after her, just released   Allergies Allergies  Allergen Reactions   Abilify [Aripiprazole]    Tylenol [Acetaminophen] Other (See Comments)    Bumps on skin/bottoms of feet   Zoloft [Sertraline Hcl]     Level of Care/Admitting Diagnosis ED Disposition     None       B Medical/Surgery History Past Medical History:  Diagnosis Date   Burning with urination 05/03/2015   Contraceptive management 05/03/2015   Depression    Dysmenorrhea 12/30/2013   Heroin addiction ([place])    Menorrhagia 12/30/2013   Menstrual extraction 12/30/2013   Migraines    Schizophrenia ([place])    Social anxiety disorder 09/25/2016   Suicidal ideations    Vaginal odor 05/03/2015   Past Surgical History:  Procedure Laterality Date   NO PAST SURGERIES       A IV Location/Drains/Wounds Patient Lines/Drains/Airways Status     Active Line/Drains/Airways     None            Intake/Output Last 24 hours No intake or output data in the 24 hours ending 04/28/21 1144  Labs/Imaging Results for orders placed or performed during the hospital encounter of 04/26/21 (from the past 48 hour(s))  Comprehensive metabolic panel     Status: Abnormal   Collection Time: 04/26/21 11:08 PM  Result Value Ref Range   Sodium 136 135 - 145 mmol/L   Potassium 3.7 3.5 - 5.1 mmol/L   Chloride 103 98 - 111 mmol/L   CO2 26 22 - 32 mmol/L   Glucose, Bld 89 70 - 99 mg/dL    Comment: Glucose reference range applies only to samples taken after fasting for at least 8 hours.   BUN 12 6 - 20 mg/dL   Creatinine, Ser 0.81 0.44 -  1.00 mg/dL   Calcium 8.6 (L) 8.9 - 10.3 mg/dL   Total Protein 7.5 6.5 - 8.1 g/dL   Albumin 3.9 3.5 - 5.0 g/dL   AST 16 15 - 41 U/L   ALT 19 0 - 44 U/L   Alkaline Phosphatase 83 38 - 126 U/L   Total Bilirubin 0.4 0.3 - 1.2 mg/dL   GFR, Estimated >60 >60 mL/min    Comment: (NOTE) Calculated using the CKD-EPI Creatinine Equation (2021)    Anion gap 7 5 - 15    Comment: Performed at [facility], [address] [place], [place] [address]  Acetaminophen level     Status: Abnormal   Collection Time: 04/26/21 11:08 PM  Result Value Ref Range   Acetaminophen (Tylenol), Serum <10 (L) 10 - 30 ug/mL    Comment: (NOTE) Therapeutic concentrations vary significantly. A range of 10-30 ug/mL  may be an effective concentration for many patients. However, some  are best treated at concentrations outside of this range. Acetaminophen concentrations >150 ug/mL at 4 hours after ingestion  and >50 ug/mL at 12 hours after ingestion are often associated with  toxic reactions.  Performed at [facility], [address] [place], [place] [address]   Ethanol     Status: None   Collection Time: 04/26/21 11:08 PM  Result Value Ref Range   Alcohol, Ethyl (B) <10 <10 mg/dL    Comment: (NOTE) Lowest detectable limit for serum alcohol is 10 mg/dL.  For medical purposes only. Performed at [facility], [address], [place], [place] [address]   Salicylate level     Status: Abnormal   Collection Time: 04/26/21 11:08 PM  Result Value Ref Range   Salicylate Lvl <4.8 (L) 7.0 - 30.0 mg/dL    Comment: Performed at [facility], [address] [place], [place] [address]  CBC with Differential     Status: None   Collection Time: 04/26/21 11:08 PM  Result Value Ref Range   WBC 6.6 4.0 - 10.5 K/uL   RBC 4.57 3.87 - 5.11 MIL/uL   Hemoglobin 13.2 12.0 - 15.0 g/dL   HCT 41.1 36.0 - 46.0 %   MCV 89.9 80.0 - 100.0 fL   MCH 28.9 26.0 - 34.0 pg   MCHC 32.1 30.0 - 36.0 g/dL   RDW 13.2 11.5 - 15.5 %   Platelets 299  150 - 400 K/uL   nRBC 0.0 0.0 - 0.2 %   Neutrophils Relative % 56 %   Neutro Abs 3.8 1.7 - 7.7 K/uL   Lymphocytes Relative 30 %   Lymphs Abs 2.0 0.7 - 4.0 K/uL   Monocytes Relative 11 %   Monocytes Absolute 0.7 0.1 - 1.0 K/uL   Eosinophils Relative 1 %   Eosinophils Absolute 0.1 0.0 - 0.5 K/uL   Basophils Relative 1 %   Basophils Absolute 0.0 0.0 - 0.1 K/uL   Immature Granulocytes 1 %   Abs Immature Granulocytes 0.03 0.00 - 0.07 K/uL    Comment: Performed at [facility], [address], [place], [place] [address]   No results found.  Pending Labs Unresulted Labs (From admission, onward)    None       Vitals/Pain Today's Vitals   04/27/21 1549 04/28/21 0436 04/28/21 0604 04/28/21 0938  BP: 105/76  94/63 94/65  Pulse: (!) 111  77 72  Resp: 20  16 16   Temp: 98.6 F (37 C)  98 F (36.7 C) 97.7 F (36.5 C)  TempSrc:    Oral  SpO2: 92%  97% 98%  Weight:  81.6 kg    Height:  5\' 3"  (1.6 m)    PainSc:    0-No pain    Isolation Precautions No active isolations  Medications Medications  fluPHENAZine (PROLIXIN) tablet 10 mg (10 mg Oral Not Given 04/27/21 2316)  hydrOXYzine (ATARAX/VISTARIL) tablet 50 mg (50 mg Oral Given 04/28/21 0940)  traZODone (DESYREL) tablet 100 mg (100 mg Oral Given 04/27/21 2258)    Mobility walks Low fall risk   Focused Assessments    R Recommendations: See Admitting Provider Note  Report given to:   Additional Notes:

## 2021-04-28 NOTE — Progress Notes (Signed)
Per Hessie Diener Bobbitt,NP, patient meets criteria for inpatient treatment. There are no available or appropriate beds at [facility] today. CSW faxed referrals to the following facilities for review:  [facility]  Pending - No Request Sent N/A [address], [place] [address] [phone] --  [facility]  Pending - No Request Sent N/A [address], [place] [address] [phone] --  [place] No Request Sent N/A [address] [place] [address] [phone] --  [place]  Pending - No Request Sent N/A [address] [place] [address] [phone] --  [facility]  Pending - No Request Sent N/A [address] [place] [address] [phone] --  [address] [place] [address] [phone] --  [place]  Pending - No Request Sent N/A [address] [place] [address] [phone] --  [facility]  Pending - No Request Sent N/A [address] [place], [place] [address] [phone] --  [facility]  Pending - No Request Sent N/A [address] [place] [address] [place] --  [facility]  Pending - No Request Sent N/A [address], [place] [address] [facility]  Pending - No Request Sent N/A [address] [place] [address] [phone] --  [facility]  Pending - No Request Sent N/A [address] [place] [address] [phone] --  [place]  Pending - No Request Sent N/A [address] [place] [address] [phone] --  [facility]  Pending  - No Request Sent N/A [place], [place] [address] [place] --  [facility]  Pending - No Request Sent N/A [address] [place] [address] [phone] --  [facility]  Pending - No Request Sent N/A [address], [place] [address] [phone] --  [facility]  Pending - No Request Sent N/A [address] [place] [address] [phone] --  [facility]  Pending - No Request Sent N/A [address] [place] [address] [phone] --  [facility]  Pending - No Request Sent N/A [address] [place] [address] [phone] --    TTS will continue to seek bed placement.  Glennie Isle, MSW, [place], LCAS-A Phone: [phone] Disposition/TOC

## 2021-04-28 NOTE — ED Notes (Signed)
Pt sleeping at this time, will reassess vitals when pt is awake.

## 2021-04-28 NOTE — ED Notes (Signed)
Pt off unit for transport to facility at this time; attempted x 4 to call report-no answer at [facility] [phone]

## 2021-04-28 NOTE — Progress Notes (Signed)
Per Lyndee Leo, pt has been accepted to [facility] A unit. Accepting provider is Dr. Hermina Barters. Patient can arrive anytime. Number for report is [phone].    Glennie Isle, MSW, LCSW-A Phone: [phone] Disposition/TOC

## 2021-04-28 NOTE — ED Notes (Signed)
Attempted report x 2 

## 2021-04-28 NOTE — ED Provider Notes (Signed)
Patient excepted by old Vertis Kelch Dr. Hermina Barters.  Patient stable for transfer.  EMTALA completed.   Fredia Sorrow, MD 04/28/21 1212

## 2021-05-16 ENCOUNTER — Emergency Department (HOSPITAL_COMMUNITY)
Admission: EM | Admit: 2021-05-16 | Discharge: 2021-05-16 | Disposition: A | Discharge status: Home / Self Care | Payer: Medicaid Other | Attending: Emergency Medicine | Admitting: Emergency Medicine

## 2021-05-16 ENCOUNTER — Encounter (HOSPITAL_COMMUNITY): Payer: Self-pay

## 2021-05-16 ENCOUNTER — Other Ambulatory Visit: Payer: Self-pay

## 2021-05-16 DIAGNOSIS — F17210 Nicotine dependence, cigarettes, uncomplicated: Secondary | ICD-10-CM | POA: Insufficient documentation

## 2021-05-16 DIAGNOSIS — T402X1A Poisoning by other opioids, accidental (unintentional), initial encounter: Secondary | ICD-10-CM | POA: Insufficient documentation

## 2021-05-16 DIAGNOSIS — T40601A Poisoning by unspecified narcotics, accidental (unintentional), initial encounter: Secondary | ICD-10-CM

## 2021-05-16 DIAGNOSIS — X58XXXA Exposure to other specified factors, initial encounter: Secondary | ICD-10-CM | POA: Insufficient documentation

## 2021-05-16 LAB — CBC WITH DIFFERENTIAL/PLATELET
Abs Immature Granulocytes: 0.12 10*3/uL — ABNORMAL HIGH (ref 0.00–0.07)
Basophils Absolute: 0 10*3/uL (ref 0.0–0.1)
Basophils Relative: 0 %
Eosinophils Absolute: 0 10*3/uL (ref 0.0–0.5)
Eosinophils Relative: 0 %
HCT: 39.2 % (ref 36.0–46.0)
Hemoglobin: 12.6 g/dL (ref 12.0–15.0)
Immature Granulocytes: 1 %
Lymphocytes Relative: 19 %
Lymphs Abs: 1.6 10*3/uL (ref 0.7–4.0)
MCH: 28.7 pg (ref 26.0–34.0)
MCHC: 32.1 g/dL (ref 30.0–36.0)
MCV: 89.3 fL (ref 80.0–100.0)
Monocytes Absolute: 0.7 10*3/uL (ref 0.1–1.0)
Monocytes Relative: 8 %
Neutro Abs: 6.2 10*3/uL (ref 1.7–7.7)
Neutrophils Relative %: 72 %
Platelets: 201 10*3/uL (ref 150–400)
RBC: 4.39 MIL/uL (ref 3.87–5.11)
RDW: 13.6 % (ref 11.5–15.5)
WBC: 8.6 10*3/uL (ref 4.0–10.5)
nRBC: 0 % (ref 0.0–0.2)

## 2021-05-16 LAB — COMPREHENSIVE METABOLIC PANEL
ALT: 42 U/L (ref 0–44)
AST: 45 U/L — ABNORMAL HIGH (ref 15–41)
Albumin: 3.7 g/dL (ref 3.5–5.0)
Alkaline Phosphatase: 65 U/L (ref 38–126)
Anion gap: 10 (ref 5–15)
BUN: 13 mg/dL (ref 6–20)
CO2: 22 mmol/L (ref 22–32)
Calcium: 8.6 mg/dL — ABNORMAL LOW (ref 8.9–10.3)
Chloride: 105 mmol/L (ref 98–111)
Creatinine, Ser: 0.62 mg/dL (ref 0.44–1.00)
GFR, Estimated: >60 mL/min (ref >60)
Glucose, Bld: 118 mg/dL — ABNORMAL HIGH (ref 70–99)
Potassium: 4.2 mmol/L (ref 3.5–5.1)
Sodium: 137 mmol/L (ref 135–145)
Total Bilirubin: 0.2 mg/dL — ABNORMAL LOW (ref 0.3–1.2)
Total Protein: 7.1 g/dL (ref 6.5–8.1)

## 2021-05-16 LAB — RAPID URINE DRUG SCREEN, HOSP PERFORMED
Amphetamines: NOT DETECTED
Barbiturates: NOT DETECTED
Benzodiazepines: NOT DETECTED
Cocaine: NOT DETECTED
Opiates: NOT DETECTED
Tetrahydrocannabinol: NOT DETECTED

## 2021-05-16 LAB — PREGNANCY, URINE: Preg Test, Ur: NEGATIVE

## 2021-05-16 MED ORDER — SODIUM CHLORIDE 0.9 % IV BOLUS
1000.0000 mL | Freq: Once | INTRAVENOUS | Status: AC
  Administered 2021-05-16: 1000 mL via INTRAVENOUS

## 2021-05-16 MED ORDER — SODIUM CHLORIDE 0.9 % IV BOLUS
500.0000 mL | Freq: Once | INTRAVENOUS | Status: AC
  Administered 2021-05-16: 500 mL via INTRAVENOUS

## 2021-05-16 MED ORDER — ONDANSETRON HCL 4 MG/2ML IJ SOLN
4.0000 mg | Freq: Once | INTRAMUSCULAR | Status: AC
  Administered 2021-05-16: 4 mg via INTRAVENOUS
  Filled 2021-05-16: qty 2

## 2021-05-16 NOTE — Discharge Instructions (Signed)
You have been evaluated for unintentional drug overdose.  Please seek help by using resources below.  Return if you have any concern.

## 2021-05-16 NOTE — ED Provider Notes (Signed)
[facility] EMERGENCY DEPARTMENT Provider Note   CSN: [id number] Arrival date & time: 05/16/21  1315     History Chief Complaint  Patient presents with   Drug Overdose    Julia [name] is a 20 y.o. female.  The history is provided by the patient, medical records and the EMS personnel. No language interpreter was used.  Drug Overdose   20 year old female significant history of schizophrenia, polysubstance abuse, depression, heroin addiction, brought here via EMS for concerns of drug overdose.  Patient admits to having history of drug abuse.  She was clean for a period of time but resumed using drugs within the past month.  She admits that she took two 20 mg Roxicodone earlier today.  Family friend found her unresponsive, perform CPR and contact EMS.  EMS believes patient never lost pulses.  Patient did receive 8 mg of Narcan by EMS with positive result.  She did vomit once.  She report that her drug overdose was accidental.  She endorsed snorting the medication and denies IV drug use.  No other complaint.  Past Medical History:  Diagnosis Date   Burning with urination 05/03/2015   Contraceptive management 05/03/2015   Depression    Dysmenorrhea 12/30/2013   Heroin addiction ([place])    Menorrhagia 12/30/2013   Menstrual extraction 12/30/2013   Migraines    Schizophrenia ([place])    Social anxiety disorder 09/25/2016   Suicidal ideations    Vaginal odor 05/03/2015    Patient Active Problem List   Diagnosis Date Noted   Substance-induced disorder ([place]) 04/27/2021   Malingering 09/05/2020   Schizoaffective disorder ([place]) 05/08/2020   Schizophrenia, paranoid ([place])    Cocaine dependence with cocaine-induced psychotic disorder with complication [facility])    Social anxiety disorder 09/25/2016   Polysubstance abuse ([place])    Dysmenorrhea 12/30/2013    Past Surgical History:  Procedure Laterality Date   NO PAST SURGERIES       OB History     Gravida  0   Para      Term       Preterm      AB      Living         SAB      IAB      Ectopic      Multiple      Live Births              Family History  Problem Relation Age of Onset   Depression Mother    Hypertension Father    Hyperlipidemia Father    Cancer Paternal Grandmother        breast, uterine   Cirrhosis Paternal Grandfather        due to alcohol    Social History   Tobacco Use   Smoking status: Every Day    Packs/day: 0.50    Types: Cigarettes   Smokeless tobacco: Never  Vaping Use   Vaping Use: Never used  Substance Use Topics   Alcohol use: Yes   Drug use: Yes    Types: Oxycodone, Cocaine, Methamphetamines    Home Medications Prior to Admission medications   Medication Sig Start Date End Date Taking? Authorizing Provider  fluPHENAZine (PROLIXIN) 5 MG tablet Take 10 mg by mouth at bedtime. 04/18/21   [provider]  hydrOXYzine (ATARAX/VISTARIL) 50 MG tablet Take 50 mg by mouth 2 (two) times daily. 04/18/21   [provider]  traZODone (DESYREL) 100 MG tablet Take 100 mg by  mouth at bedtime. 04/18/21   [provider]    Allergies    Abilify [aripiprazole], Tylenol [acetaminophen], and Zoloft [sertraline hcl]  Review of Systems   Review of Systems  All other systems reviewed and are negative.  Physical Exam Updated Vital Signs BP 106/70   Pulse 97   Temp (!) 97.1 F (36.2 C) (Oral)   Resp 15   Ht $R'5\' 3"'rc$  (1.6 m)   Wt 77.1 kg   SpO2 94%   BMI 30.11 kg/m   Physical Exam Vitals and nursing note reviewed.  Constitutional:      General: She is not in acute distress.    Appearance: She is well-developed.  HENT:     Head: Atraumatic.  Eyes:     Conjunctiva/sclera: Conjunctivae normal.  Cardiovascular:     Rate and Rhythm: Normal rate and regular rhythm.     Pulses: Normal pulses.     Heart sounds: Normal heart sounds.  Pulmonary:     Effort: Pulmonary effort is normal.  Abdominal:     Palpations: Abdomen is soft.      Tenderness: There is no abdominal tenderness.  Musculoskeletal:     Cervical back: Neck supple.  Skin:    Findings: No rash.  Neurological:     Mental Status: She is alert. Mental status is at baseline.     GCS: GCS eye subscore is 4. GCS verbal subscore is 5. GCS motor subscore is 6.     Cranial Nerves: Cranial nerves 2-12 are intact.     Sensory: Sensation is intact.     Motor: Motor function is intact.  Psychiatric:        Mood and Affect: Mood normal.    ED Results / Procedures / Treatments   Labs (all labs ordered are listed, but only abnormal results are displayed) Labs Reviewed  CBC WITH DIFFERENTIAL/PLATELET - Abnormal; Notable for the following components:      Result Value   Abs Immature Granulocytes 0.12 (*)    All other components within normal limits  COMPREHENSIVE METABOLIC PANEL - Abnormal; Notable for the following components:   Glucose, Bld 118 (*)    Calcium 8.6 (*)    AST 45 (*)    Total Bilirubin 0.2 (*)    All other components within normal limits  RAPID URINE DRUG SCREEN, HOSP PERFORMED  PREGNANCY, URINE    EKG None ED ECG REPORT   Date: 05/16/2021  Rate: 101  Rhythm: sinus tachycardia  QRS Axis: normal  Intervals: normal  ST/T Wave abnormalities: normal  Conduction Disutrbances:none  Narrative Interpretation:   Old EKG Reviewed: none available  I have personally reviewed the EKG tracing and agree with the computerized printout as noted.  Radiology No results found.  Procedures Procedures   Medications Ordered in ED Medications  sodium chloride 0.9 % bolus 500 mL (500 mLs Intravenous New Bag/Given 05/16/21 1353)    ED Course  I have reviewed the triage vital signs and the nursing notes.  Pertinent labs & imaging results that were available during my care of the patient were reviewed by me and considered in my medical decision making (see chart for details).    MDM Rules/Calculators/A&P                           BP (!) 95/58    Pulse (!) 102   Temp (!) 97.1 F (36.2 C) (Oral)   Resp 12   Ht 5'  3" (1.6 m)   Wt 77.1 kg   SpO2 95%   BMI 30.11 kg/m   Final Clinical Impression(s) / ED Diagnoses Final diagnoses:  Opiate overdose, accidental or unintentional, initial encounter ([place])    Rx / DC Orders ED Discharge Orders     None      2:06 PM Patient here for evaluation of accidental drug overdose.  She was snorting Roxicodone possibly laced with fentanyl.  She became unresponsive, and did receive CPR by a friend that found her although EMS felt that she never lost pulses.  At this time she endorsed that this is an accidental drug overdose.  She denies any other drug use.  She denies SI or HI.  Will monitor closely.  5:19 PM Labs are reassuring, patient has been monitored in the ED for the past 4 hours.  She is back to her baseline.  She does have someone to come and pick her up.  I did offer a Narcan kit to decrease risk of drug overdose as well as provide resources.  Patient states she is allergic to Narcan   Domenic Moras, PA-C 05/16/21 Daron Offer, MD 05/18/21 [phone]

## 2021-05-16 NOTE — ED Triage Notes (Signed)
States that medication may have been laced with Fentanyl.

## 2021-05-16 NOTE — ED Triage Notes (Signed)
Patient snorted two 10 mg Roxicodone PTA. CPR was started by friends however it is believed that she never lost pulses per fire department. Narcan 8mg  administered prior to EMS arrival. Patient vomiting at this time. States that OD was accidental. Alert and oriented on arrival.

## 2021-05-16 NOTE — ED Notes (Signed)
Patient called me to room and states that she has somewhere to go and wants IV out now. Demanded that it be removed. PA notified and states that he will speak with patient.

## 2021-05-18 ENCOUNTER — Emergency Department (HOSPITAL_COMMUNITY)
Admission: EM | Admit: 2021-05-18 | Discharge: 2021-05-21 | Disposition: A | Discharge status: Home / Self Care | Payer: Medicaid Other | Attending: Emergency Medicine | Admitting: Emergency Medicine

## 2021-05-18 DIAGNOSIS — F1999 Other psychoactive substance use, unspecified with unspecified psychoactive substance-induced disorder: Secondary | ICD-10-CM | POA: Diagnosis not present

## 2021-05-18 DIAGNOSIS — F1520 Other stimulant dependence, uncomplicated: Secondary | ICD-10-CM | POA: Insufficient documentation

## 2021-05-18 DIAGNOSIS — R000 Tachycardia, unspecified: Secondary | ICD-10-CM | POA: Insufficient documentation

## 2021-05-18 DIAGNOSIS — Y900 Blood alcohol level of less than 20 mg/100 ml: Secondary | ICD-10-CM | POA: Insufficient documentation

## 2021-05-18 DIAGNOSIS — F17210 Nicotine dependence, cigarettes, uncomplicated: Secondary | ICD-10-CM | POA: Diagnosis not present

## 2021-05-18 DIAGNOSIS — F250 Schizoaffective disorder, bipolar type: Secondary | ICD-10-CM | POA: Insufficient documentation

## 2021-05-18 DIAGNOSIS — Z20822 Contact with and (suspected) exposure to covid-19: Secondary | ICD-10-CM | POA: Insufficient documentation

## 2021-05-18 DIAGNOSIS — Z79899 Other long term (current) drug therapy: Secondary | ICD-10-CM | POA: Diagnosis not present

## 2021-05-18 DIAGNOSIS — F29 Unspecified psychosis not due to a substance or known physiological condition: Secondary | ICD-10-CM | POA: Insufficient documentation

## 2021-05-18 DIAGNOSIS — F259 Schizoaffective disorder, unspecified: Secondary | ICD-10-CM | POA: Diagnosis present

## 2021-05-18 LAB — CBC WITH DIFFERENTIAL/PLATELET
Abs Immature Granulocytes: 0.09 K/uL — ABNORMAL HIGH (ref 0.00–0.07)
Basophils Absolute: 0 K/uL (ref 0.0–0.1)
Basophils Relative: 1 %
Eosinophils Absolute: 0 K/uL (ref 0.0–0.5)
Eosinophils Relative: 0 %
HCT: 41.5 % (ref 36.0–46.0)
Hemoglobin: 13.4 g/dL (ref 12.0–15.0)
Immature Granulocytes: 1 %
Lymphocytes Relative: 9 %
Lymphs Abs: 0.6 K/uL — ABNORMAL LOW (ref 0.7–4.0)
MCH: 28.9 pg (ref 26.0–34.0)
MCHC: 32.3 g/dL (ref 30.0–36.0)
MCV: 89.4 fL (ref 80.0–100.0)
Monocytes Absolute: 1 K/uL (ref 0.1–1.0)
Monocytes Relative: 16 %
Neutro Abs: 4.9 K/uL (ref 1.7–7.7)
Neutrophils Relative %: 73 %
Platelets: 219 K/uL (ref 150–400)
RBC: 4.64 MIL/uL (ref 3.87–5.11)
RDW: 14.2 % (ref 11.5–15.5)
WBC: 6.6 K/uL (ref 4.0–10.5)
nRBC: 0 % (ref 0.0–0.2)

## 2021-05-18 LAB — URINALYSIS, ROUTINE W REFLEX MICROSCOPIC
Bacteria, UA: NONE SEEN
Bilirubin Urine: NEGATIVE
Glucose, UA: NEGATIVE mg/dL
Hgb urine dipstick: NEGATIVE
Ketones, ur: NEGATIVE mg/dL
Leukocytes,Ua: NEGATIVE
Nitrite: NEGATIVE
Protein, ur: 30 mg/dL — AB
Specific Gravity, Urine: 1.021 (ref 1.005–1.030)
pH: 7 (ref 5.0–8.0)

## 2021-05-18 LAB — RAPID URINE DRUG SCREEN, HOSP PERFORMED
Amphetamines: POSITIVE — AB
Barbiturates: NOT DETECTED
Benzodiazepines: POSITIVE — AB
Cocaine: NOT DETECTED
Opiates: NOT DETECTED
Tetrahydrocannabinol: NOT DETECTED

## 2021-05-18 LAB — COMPREHENSIVE METABOLIC PANEL
ALT: 28 U/L (ref 0–44)
AST: 25 U/L (ref 15–41)
Albumin: 4.2 g/dL (ref 3.5–5.0)
Alkaline Phosphatase: 72 U/L (ref 38–126)
Anion gap: 9 (ref 5–15)
BUN: 11 mg/dL (ref 6–20)
CO2: 25 mmol/L (ref 22–32)
Calcium: 9.1 mg/dL (ref 8.9–10.3)
Chloride: 102 mmol/L (ref 98–111)
Creatinine, Ser: 0.83 mg/dL (ref 0.44–1.00)
GFR, Estimated: >60 mL/min (ref >60)
Glucose, Bld: 106 mg/dL — ABNORMAL HIGH (ref 70–99)
Potassium: 3.8 mmol/L (ref 3.5–5.1)
Sodium: 136 mmol/L (ref 135–145)
Total Bilirubin: 0.3 mg/dL (ref 0.3–1.2)
Total Protein: 8.2 g/dL — ABNORMAL HIGH (ref 6.5–8.1)

## 2021-05-18 LAB — POC URINE PREG, ED: Preg Test, Ur: NEGATIVE

## 2021-05-18 LAB — ACETAMINOPHEN LEVEL: Acetaminophen (Tylenol), Serum: <10 ug/mL — ABNORMAL LOW (ref 10–30)

## 2021-05-18 LAB — ETHANOL: Alcohol, Ethyl (B): <10 mg/dL (ref <10)

## 2021-05-18 LAB — SALICYLATE LEVEL: Salicylate Lvl: <7 mg/dL — ABNORMAL LOW (ref 7.0–30.0)

## 2021-05-18 MED ORDER — THIAMINE HCL 100 MG PO TABS
100.0000 mg | ORAL_TABLET | Freq: Every day | ORAL | Status: DC
  Administered 2021-05-19 – 2021-05-21 (×3): 100 mg via ORAL
  Filled 2021-05-18 (×3): qty 1

## 2021-05-18 MED ORDER — LORAZEPAM 2 MG/ML IJ SOLN: 2.0000 mg | Freq: Once | INTRAMUSCULAR | Status: DC

## 2021-05-18 MED ORDER — LORAZEPAM 2 MG/ML IJ SOLN: 0.0000 mg | Freq: Two times a day (BID) | INTRAMUSCULAR | Status: DC

## 2021-05-18 MED ORDER — LORAZEPAM 1 MG PO TABS: 0.0000 mg | ORAL_TABLET | Freq: Two times a day (BID) | ORAL | Status: DC

## 2021-05-18 MED ORDER — LORAZEPAM 2 MG/ML IJ SOLN: 0.0000 mg | Freq: Four times a day (QID) | INTRAMUSCULAR | Status: AC

## 2021-05-18 MED ORDER — IBUPROFEN 400 MG PO TABS
600.0000 mg | ORAL_TABLET | Freq: Three times a day (TID) | ORAL | Status: DC | PRN
  Administered 2021-05-19 – 2021-05-20 (×3): 600 mg via ORAL
  Filled 2021-05-18 (×3): qty 2

## 2021-05-18 MED ORDER — ONDANSETRON HCL 4 MG PO TABS: 4.0000 mg | ORAL_TABLET | Freq: Three times a day (TID) | ORAL | Status: DC | PRN

## 2021-05-18 MED ORDER — LORAZEPAM 1 MG PO TABS
0.0000 mg | ORAL_TABLET | Freq: Four times a day (QID) | ORAL | Status: AC
  Administered 2021-05-18: 2 mg via ORAL
  Administered 2021-05-19 – 2021-05-20 (×2): 1 mg via ORAL
  Filled 2021-05-18: qty 1
  Filled 2021-05-18: qty 2

## 2021-05-18 MED ORDER — THIAMINE HCL 100 MG/ML IJ SOLN: 100.0000 mg | Freq: Every day | INTRAMUSCULAR | Status: DC

## 2021-05-18 MED ORDER — ZOLPIDEM TARTRATE 5 MG PO TABS
5.0000 mg | ORAL_TABLET | Freq: Every evening | ORAL | Status: DC | PRN
  Administered 2021-05-19: 5 mg via ORAL
  Filled 2021-05-18: qty 1

## 2021-05-18 NOTE — SANE Note (Signed)
The SANE/FNE (Forensic Nurse Examiner) consult has been completed. The primary RN and/or provider have been notified. Please contact the SANE/FNE nurse on call (listed in Amion) with any further concerns.  

## 2021-05-18 NOTE — ED Notes (Signed)
Paged Sane nurse around 17:00
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
## 2021-05-18 NOTE — ED Notes (Addendum)
Pt refusing EKG and CBG from staff. MD aware

## 2021-05-18 NOTE — SANE Note (Signed)
Upon my arrival I found patient in a hallway bed surrounded by several other patients. With no other patient rooms available, I took the patient to the family room for privacy. After the consult was complete I escorted the patient back to her hallway bed where she signed a declination of services.
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
## 2021-05-18 NOTE — ED Notes (Signed)
Attempted to contact [facility] per Dr Sabra Heck to follow on status of when patient will have consult done. No answer at this time.

## 2021-05-18 NOTE — Consult Note (Signed)
Notified of request for FN consult. Rodney Cruise RN notified and will respond.

## 2021-05-18 NOTE — ED Notes (Signed)
Dr. Sabra Heck to see patient at time of triage for [facility].
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
## 2021-05-19 LAB — RESP PANEL BY RT-PCR (FLU A&B, COVID) ARPGX2
Influenza A by PCR: POSITIVE — AB
Influenza B by PCR: NEGATIVE
SARS Coronavirus 2 by RT PCR: NEGATIVE

## 2021-05-19 MED ORDER — PALIPERIDONE ER 6 MG PO TB24
6.0000 mg | ORAL_TABLET | Freq: Every day | ORAL | Status: DC
  Administered 2021-05-19: 6 mg via ORAL
  Filled 2021-05-19 (×3): qty 1

## 2021-05-19 MED ORDER — ZIPRASIDONE MESYLATE 20 MG IM SOLR: 20.0000 mg | INTRAMUSCULAR | Status: DC | PRN

## 2021-05-19 MED ORDER — LORAZEPAM 1 MG PO TABS: 1.0000 mg | ORAL_TABLET | ORAL | Status: DC | PRN
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
## 2021-05-20 MED ORDER — LORAZEPAM 1 MG PO TABS
1.0000 mg | ORAL_TABLET | Freq: Four times a day (QID) | ORAL | Status: DC | PRN
  Administered 2021-05-20: 1 mg via ORAL
  Filled 2021-05-20: qty 1

## 2021-05-20 MED ORDER — PALIPERIDONE ER 3 MG PO TB24
6.0000 mg | ORAL_TABLET | Freq: Every day | ORAL | Status: DC
  Administered 2021-05-20 – 2021-05-21 (×2): 6 mg via ORAL
  Filled 2021-05-20 (×3): qty 2
  Filled 2021-05-20: qty 4
  Filled 2021-05-20: qty 2

## 2021-05-20 NOTE — Progress Notes (Addendum)
Inpatient Behavioral Health Placement   @8 :54am CSW inquire with [facility] about review of pt's inpatient behavioral health referral for placement at [facility]. CSW is awaiting response from [facility]. @10 :00am CSW was advised to send pt's referral out of network. CSW will assist and follow for inpatient behavioral health placement.   Pt meets inpatient criteria per Julia Reichert, NP. Per [place], RN there is no appropriate beds for pt.  Referral was sent to the following facilities:  Destination Service Provider Address Phone Fax  [place] [place] [address] [phone]  [place], [place] [address] [phone] [facility]  [place], [place] [address] [phone]  [facility]  [place] [place] [place] [address] [phone] [facility]  [place] [place] [address] [phone] [facility]  [address] [place] [address] [phone]  [facility]  [address] [place] [address] [phone] [place]  [address], [place] [address] [phone] [facility]  [address], [place] [address] [phone] [place]  [address], [place] [address] [phone] [facility]  [address] [place] [address] [phone] [facility]  [address] [place] [address] [place]  [facility]  [place], [place] [address] [phone]  [facility]  [address], [place] [address] [phone]  [facility]  [address], [place] [address] [phone]      Situation ongoing,  CSW will follow up.   Benjaman Kindler, MSW, [facility] 05/20/2021  @ 5:41 PM

## 2021-05-21 ENCOUNTER — Encounter (HOSPITAL_COMMUNITY): Payer: Self-pay

## 2021-05-21 ENCOUNTER — Other Ambulatory Visit: Payer: Self-pay

## 2021-05-21 ENCOUNTER — Emergency Department (HOSPITAL_COMMUNITY)
Admission: EM | Admit: 2021-05-21 | Discharge: 2021-05-21 | Disposition: A | Discharge status: Home / Self Care | Payer: Medicaid Other | Source: Home / Self Care | Attending: Emergency Medicine | Admitting: Emergency Medicine

## 2021-05-21 DIAGNOSIS — F17210 Nicotine dependence, cigarettes, uncomplicated: Secondary | ICD-10-CM | POA: Insufficient documentation

## 2021-05-21 DIAGNOSIS — F1999 Other psychoactive substance use, unspecified with unspecified psychoactive substance-induced disorder: Secondary | ICD-10-CM

## 2021-05-21 DIAGNOSIS — F259 Schizoaffective disorder, unspecified: Secondary | ICD-10-CM | POA: Insufficient documentation
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
## 2021-05-21 NOTE — ED Notes (Signed)
Breakfast tray placed on bedside table. Resting at this time.

## 2021-05-21 NOTE — Discharge Instructions (Signed)
Follow up as instructed by behavior health
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
## 2021-05-21 NOTE — ED Notes (Signed)
Per Pecolia Ades, NP: "this patient is psych cleared. She wants information for substance use treatment and homeless shelter information per CSW".

## 2021-05-21 NOTE — Discharge Instructions (Signed)
You were seen today for evaluation of auditory and visual hallucinations.  Please refer to your previous discharge papers from earlier today and follow-up with the recommended social supports provided to you by our social worker.  Your physical exam was completely normal.  And as you are not suicidal or have intentions of harm to yourself or others, there is no medical reason to keep you in the hospital today.  If either of these changes, please return to the ED for reevaluation.
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
## 2021-05-23 ENCOUNTER — Ambulatory Visit (HOSPITAL_COMMUNITY)
Admission: EM | Admit: 2021-05-23 | Discharge: 2021-05-23 | Disposition: A | Discharge status: Rehabilitation Facility | Payer: Medicaid Other | Attending: Psychiatry | Admitting: Psychiatry

## 2021-05-23 DIAGNOSIS — F1910 Other psychoactive substance abuse, uncomplicated: Secondary | ICD-10-CM | POA: Diagnosis not present

## 2021-05-23 DIAGNOSIS — Z8659 Personal history of other mental and behavioral disorders: Secondary | ICD-10-CM | POA: Insufficient documentation

## 2021-05-23 DIAGNOSIS — F259 Schizoaffective disorder, unspecified: Secondary | ICD-10-CM | POA: Diagnosis not present

## 2021-05-23 DIAGNOSIS — Z9151 Personal history of suicidal behavior: Secondary | ICD-10-CM | POA: Diagnosis not present

## 2021-05-23 DIAGNOSIS — Z5900 Homelessness unspecified: Secondary | ICD-10-CM | POA: Insufficient documentation

## 2021-05-23 NOTE — ED Provider Notes (Signed)
Behavioral Health Urgent Care Medical Screening Exam  Patient Name: Julia [name] MRN: [id number] Date of Evaluation: 05/23/21 Chief Complaint:   Diagnosis:  Final diagnoses:  Polysubstance abuse ([place])    History of Present illness: Julia [name] is a 20 y.o. female.  Patient presents voluntarily to [facility] behavioral health for walk-in assessment.  She reports [place] because she felt that somebody "had put a hit" on her on yesterday.  Today she is clear and insightful, no paranoid delusions noted.  Julia [name] reports recent stressors include homelessness and substance use disorder.  She reports last use of alcohol, methamphetamine, cocaine and marijuana on yesterday.  Julia [name] reports she has been diagnosed with schizoaffective disorder and was discharged from an inpatient psychiatric stay at [facility] approximately 1 week ago.  She reports she did not continue her medications 1 week ago as she did not pick these medications up from the pharmacy.  She indicates to me that she does have money to pick up her medications in her belongings at this time.  Julia [name] reports she is followed by an act team, [facility].  Spoke with act team representative Julia [name] phone number [phone] (this is Julia [name] personal cell phone number) who reports several was discharged from their service in spring 2022 after a lengthy jail stay.  [facility] has continue to follow Julia [name] she has been out of their parameter, in the [place] in [place] as well as [name] areas recently.  Julia [name] would like to be transferred to [facility] in [place] so that she can continue to follow-up with her act team. ACT team representative, Julia [name], reports Julia [name] has a history of failing to follow-up with outpatient psychiatry.  Julia [name] shares that [name] does have supportive aunt and cousin in the [place] area.  Patient is assessed  face-to-face by nurse practitioner.  She is seated in assessment area, no acute distress.  She is alert and oriented, pleasant and cooperative during assessment.  She endorses euthymic mood, congruent affect.  She denies suicidal and homicidal ideations currently.  She endorses history of 1 prior suicide attempt at age 62 when she intentionally overdosed on medication.  She denies self-harm behavior.  She contracts verbally for safety with this Probation officer.  She has normal speech and behavior.  She denies both auditory and visual hallucinations.  Patient is able to converse coherently with goal-directed thoughts and no distractibility or preoccupation.  She denies paranoia.  Objectively there is no evidence of psychosis/mania or delusional thinking.  Julia [name] reports she has been diagnosed with schizoaffective disorder and has been stable on Invega, Prozac and Depakote.  She plans to follow-up with ACT team.  She is currently homeless, denies access to weapons.  She has most recently resided at the [facility] in [place].  She is currently not employed.  She endorses substance use but is committed to her recovery at this time.  She endorses average sleep and appetite.  Patient offered support and encouragement.  She gives verbal consent to speak with Julia [name] ACT team at [phone], Julia [name], with act team will reinstate Julia [name]'s act team services if she remains in the [facility] area.   Psychiatric Specialty Exam  Presentation  General Appearance:Appropriate for Environment; Casual  Eye Contact:Good  Speech:Clear and Coherent; Normal Rate  Speech Volume:Normal  Handedness:Right   Mood and Affect  Mood:Euthymic  Affect:Appropriate; Congruent   Thought Process  Thought Processes:Coherent; Goal Directed;  Linear  Descriptions of Associations:Intact  Orientation:Full (Time, Place and Person)  Thought Content:Logical; WDL  Diagnosis of Schizophrenia or Schizoaffective  disorder in past: Yes  Duration of Psychotic Symptoms: Greater than six months  Hallucinations:None  Ideas of Reference:None  Suicidal Thoughts:No  Homicidal Thoughts:No   Sensorium  Memory:Immediate Good; Recent Good; Remote Good  Judgment:Fair  Insight:Fair   Executive Functions  Concentration:Good  Attention Span:Good  [place]  Language:Good   Psychomotor Activity  Psychomotor Activity:Normal   Assets  Assets:Communication Skills; Desire for Improvement; Physical Health; Resilience; Social Support   Sleep  Sleep:Fair  Number of hours: No data recorded  Nutritional Assessment (For OBS and FBC admissions only) Has the patient had a weight loss or gain of 10 pounds or more in the last 3 months?: No Has the patient had a decrease in food intake/or appetite?: No Does the patient have dental problems?: No Does the patient have eating habits or behaviors that may be indicators of an eating disorder including binging or inducing vomiting?: No Has the patient recently lost weight without trying?: 0 Has the patient been eating poorly because of a decreased appetite?: 0 Malnutrition Screening Tool Score: 0   Physical Exam: Physical Exam Vitals and nursing note reviewed.  Constitutional:      Appearance: Normal appearance. She is well-developed.  HENT:     Head: Normocephalic and atraumatic.     Nose: Nose normal.  Cardiovascular:     Rate and Rhythm: Normal rate.  Pulmonary:     Effort: Pulmonary effort is normal.  Musculoskeletal:        General: Normal range of motion.     Cervical back: Normal range of motion.  Skin:    General: Skin is warm and dry.  Neurological:     Mental Status: She is alert and oriented to person, place, and time.  Psychiatric:        Attention and Perception: Attention and perception normal.        Mood and Affect: Mood and affect normal.        Speech: Speech normal.        Behavior: Behavior  normal. Behavior is cooperative.        Thought Content: Thought content normal.        Cognition and Memory: Cognition and memory normal.        Judgment: Judgment normal.   Review of Systems  Constitutional: Negative.   HENT: Negative.    Eyes: Negative.   Respiratory: Negative.    Cardiovascular: Negative.   Gastrointestinal: Negative.   Genitourinary: Negative.   Musculoskeletal: Negative.   Skin: Negative.   Neurological: Negative.   Endo/Heme/Allergies: Negative.   Psychiatric/Behavioral:  Positive for substance abuse.   Blood pressure 121/83, pulse (!) 106, temperature 98.2 F (36.8 C), temperature source Oral, resp. rate 20, SpO2 99 %. There is no height or weight on file to calculate BMI.  Musculoskeletal: Strength & Muscle Tone: within normal limits Gait & Station: normal Patient leans: N/A   [facility] MSE Discharge Disposition for Follow up and Recommendations: Patient reviewed with Dr. Serafina Mitchell. Follow-up with established outpatient psychiatry through [facility]. Can transfer to [facility] in [place].   Lucky Rathke, FNP Alcoholic 03/0/0923, 3:00 AM

## 2021-05-23 NOTE — BH Assessment (Signed)
Comprehensive Clinical Assessment (CCA) Note  05/23/2021 Julia [name] [id number]  Disposition: Pending.   [place] [place] ED from 05/23/2021 in [facility] ED from 05/21/2021 in [place] ED from 05/18/2021 in [place] CATEGORY High Risk Error: Q7 should not be populated when Q6 is No Moderate Risk      The patient demonstrates the following risk factors for suicide: Chronic risk factors for suicide include: psychiatric disorder of  Schizoaffective disorder, Bipolar type, substance use disorder, previous suicide attempts Pt reports, she attempted suicide in 2018, and previous self-harm Pt reports, the last time she cut was in 2018 when she attempted suicide . Acute risk factors for suicide include: unemployment, social withdrawal/isolation, and Pt is suicidal . Protective factors for this patient include:  None . Considering these factors, the overall suicide risk at this point appears to be high. Patient is appropriate for outpatient follow up.  Julia [name] is a 20 year old female who presents voluntary and unaccompanied to [facility]. Clinician asked the pt, "what brought you to the hospital?" Pt reports, her father passed away, she was staying in a shelter but has been homeless for two days. Pt reports, she's suicidal with no plan, wants to hurt anybody (no one in particular) with no access to weapons. Pt reports, hearing whispers. Pt reports, in 2018 she attempted suicide by cutting and overdosing. Pt reports, that was the last time she cut herself.   Pt reports, using Cocaine 2-3 weeks ago, and using Methamphetamines twice and has overdosed. Pt reports, she's linked to an ACT Team. Pt reports, she was at [facility] a week ago for a week.   Pt presents quiet, awake with normal speech. Pt's mood was anxious, depressed. Pt's affect was flat. Pt's insight was lacking. Pt's judgement was poor. Pt reports, if discharged  she can not contract for safety.   Diagnosis: Schizoaffective disorder, Bipolar type.  *Pt denies, having family, friend supports.*   Chief Complaint: No chief complaint on file.  Visit Diagnosis:     CCA Screening, Triage and Referral (STR)  Patient Reported Information How did you hear about [place]? Legal System  What Is the Reason for Your Visit/Call Today? Pt reports, her father passed away, she was in a shelter and has been homeless for two days. Pt reports, she's suicidal with no plan, wants to hurt anybody with no access to weapons. Pt reports, hearing whispers.  How Long Has This Been Causing You Problems? 1 wk - 1 month  What Do You Feel Would Help You the Most Today? Treatment for Depression or other mood problem; Alcohol or Drug Use Treatment   Have You Recently Had Any Thoughts About Hurting Yourself? Yes  Are You Planning to Commit Suicide/Harm Yourself At This time? No   Have you Recently Had Thoughts About [place]? Yes  Are You Planning to Harm Someone at This Time? No  Explanation: No data recorded  Have You Used Any Alcohol or Drugs in the Past 24 Hours? Yes  How Long Ago Did You Use Drugs or Alcohol? No data recorded What Did You Use and How Much? Pt reports, using Cocaine 2-3 weeks ago, and using Methamphetamines twice and has overdosed.   Do You Currently Have a Therapist/Psychiatrist? Yes  Name of Therapist/Psychiatrist: Pt reports, she has an ACT Team.   Have You Been Recently Discharged From Any Office Practice or Programs? Yes  Explanation of Discharge From Practice/Program:  Discharged from [facility] three days ago     CCA Screening Triage Referral Assessment Type of Contact: Face-to-Face  Telemedicine Service Delivery:   Is this Initial or Reassessment? Initial Assessment  Date Telepsych consult ordered in [facility]:  05/18/21  Time Telepsych consult ordered in [facility]:  1722  Location of Assessment: [facility] Assessment  Services  Provider Location: [facility] Assessment Services   Collateral Involvement: Pt denies, having supports.   Does Patient Have a Stage manager Guardian? No data recorded Name and Contact of Legal Guardian: No data recorded If Minor and Not Living with Parent(s), Who has Custody? N/A  Is CPS involved or ever been involved? Currently  Is APS involved or ever been involved? Never   Patient Determined To Be At Risk for Harm To Self or Others Based on Review of Patient Reported Information or Presenting Complaint? Yes, for Self-Harm  Method: No data recorded Availability of Means: No data recorded Intent: No data recorded Notification Required: No data recorded Additional Information for Danger to Others Potential: No data recorded Additional Comments for Danger to Others Potential: No data recorded Are There Guns or Other Weapons in Your Home? No data recorded Types of Guns/Weapons: No data recorded Are These Weapons Safely Secured?                            No data recorded Who Could Verify You Are Able To Have These Secured: No data recorded Do You Have any Outstanding Charges, Pending Court Dates, Parole/Probation? No data recorded Contacted To Inform of Risk of Harm To Self or Others: Unable to Contact:    Does Patient Present under Involuntary Commitment? No  IVC Papers Initial File Date: 03/07/21   [place] of Residence: [place]   Patient Currently Receiving the Following Services: ACTT Architect)   Determination of Need: Urgent (48 hours)   Options For Referral: Facility-Based Crisis; Inpatient Hospitalization; Medication Management; [facility] Urgent Care; Outpatient Therapy     CCA Biopsychosocial Patient Reported Schizophrenia/Schizoaffective Diagnosis in Past: Yes   Strengths: Pt expressed wanting help.   Mental Health Symptoms Depression:   Change in energy/activity; Fatigue; Difficulty Concentrating; Worthlessness;  Hopelessness; Irritability (Isolation, blames self for eveything.)   Duration of Depressive symptoms:  Duration of Depressive Symptoms: Greater than two weeks   Mania:   Recklessness; Change in energy/activity   Anxiety:    Difficulty concentrating; Worrying; Tension (Panic attacks.)   Psychosis:   Hallucinations   Duration of Psychotic symptoms:    Trauma:   Avoids reminders of event; Emotional numbing   Obsessions:   None   Compulsions:   None   Inattention:   Forgetful   Hyperactivity/Impulsivity:   Feeling of restlessness; Fidgets with hands/feet   Oppositional/Defiant Behaviors:   Argumentative; Temper; Angry   Emotional Irregularity:   Intense/inappropriate anger; Potentially harmful impulsivity; Recurrent suicidal behaviors/gestures/threats   Other Mood/Personality Symptoms:   None noted    Mental Status Exam Appearance and self-care  Stature:   Average   Weight:   Average weight   Clothing:   -- (Scrubs)   Grooming:   Normal   Cosmetic use:   None   Posture/gait:   Normal   Motor activity:   Not Remarkable   Sensorium  Attention:   Normal   Concentration:   Normal   Orientation:   X5   Recall/memory:   Normal   Affect and Mood  Affect:   Flat  Mood:   Depressed; Anxious   Relating  Eye contact:   Normal   Facial expression:   Depressed   Attitude toward examiner:   Cooperative   Thought and Language  Speech flow:  Normal   Thought content:   Appropriate to Mood and Circumstances   Preoccupation:   Ruminations   Hallucinations:   Auditory   Organization:  No data recorded  [employer] of Knowledge:   Fair   Intelligence:   Needs investigation   Abstraction:   Concrete   Judgement:   Impaired   Reality Testing:   Distorted   Insight:   Lacking   Decision Making:   Impulsive   Social Functioning  Social Maturity:   Irresponsible; Impulsive   Social Judgement:    Heedless   Stress  Stressors:   Housing; Teacher, music Ability:   Overwhelmed; Deficient supports   Skill Deficits:   Communication; Decision making; Responsibility; Self-care   Supports:   Support needed     Religion: Religion/Spirituality Are You A Religious Person?: Yes What is Your Religious Affiliation?: Christian  Leisure/Recreation: Leisure / Recreation Do You Have Hobbies?: No  Exercise/Diet: Exercise/Diet Do You Follow a Special Diet?: No Do You Have Any Trouble Sleeping?: No   CCA Employment/Education Employment/Work Situation: Employment / Work Situation Employment Situation: Unemployed Why is Patient on Disability: NA How Long has Patient Been on Disability: NA Has Patient ever Been in the [employer]?: No  Education: Education Is Patient Currently Attending School?: No Last Grade Completed: 12 Did You Nutritional therapist?: No   CCA Family/Childhood History Family and Relationship History: Family history Marital status: Single Does patient have children?: No  Childhood History:  Childhood History By whom was/is the patient raised?: Both parents (Per chart.) Did patient suffer any verbal/emotional/physical/sexual abuse as a child?: No (Pt denies.) Did patient suffer from severe childhood neglect?: No (Pt denies.) Has patient ever been sexually abused/assaulted/raped as an adolescent or adult?: No (Pt denies.) Was the patient ever a victim of a crime or a disaster?: No (Pt denies.) Spoken with a professional about abuse?:  (NA) Does patient feel these issues are resolved?:  (NA) Witnessed domestic violence?: No (Pt denies.) Has patient been affected by domestic violence as an adult?:  (NA)  Child/Adolescent Assessment:     CCA Substance Use Alcohol/Drug Use: Alcohol / Drug Use Pain Medications: See MAR Prescriptions: See MAR Over the Counter: See MAR History of alcohol / drug use?: Yes Substance #1 Name of Substance 1: Cocaine. 1  - Age of First Use: UTA 1 - Amount (size/oz): Pt reports, she used Cocaine 2-3 weeks ago. 1 - Frequency: UTA 1 - Duration: UTA 1 - Last Use / Amount: Pt reports, she used Cocaine 2-3 weeks ago. 1 - Method of Aquiring: UTA 1- Route of Use: UTA Substance #2 Name of Substance 2: Methamphetamines. 2 - Age of First Use: UTA 2 - Amount (size/oz): UTA 2 - Frequency: UTA 2 - Duration: UTA 2 - Last Use / Amount: UTA 2 - Method of Aquiring: UTA 2 - Route of Substance Use: UTA     ASAM's:  Six Dimensions of Multidimensional Assessment  Dimension 1:  Acute Intoxication and/or Withdrawal Potential:      Dimension 2:  Biomedical Conditions and Complications:      Dimension 3:  Emotional, Behavioral, or Cognitive Conditions and Complications:     Dimension 4:  Readiness to Change:     Dimension 5:  Relapse, Continued  use, or Continued Problem Potential:     Dimension 6:  Recovery/Living Environment:     ASAM Severity Score:    ASAM Recommended Level of Treatment:     Substance use Disorder (SUD)    Recommendations for Services/Supports/Treatments:    Discharge Disposition:    DSM5 Diagnoses: Patient Active Problem List   Diagnosis Date Noted   Substance-induced disorder ([place]) 04/27/2021   Malingering 06/02/2020   Homicidal ideation 06/02/2020   Paranoia ([place]) 06/02/2020   Suicidal behavior 06/02/2020   Schizophrenia ([place]) 06/02/2020   Schizoaffective disorder ([place]) 05/08/2020   Schizophrenia, paranoid ([place])    Cocaine dependence with cocaine-induced psychotic disorder with complication [facility])    Social anxiety disorder 09/25/2016   Polysubstance abuse ([place])    Dysmenorrhea 12/30/2013     Referrals to Alternative Service(s): Referred to Alternative Service(s):   Place:   Date:   Time:    Referred to Alternative Service(s):   Place:   Date:   Time:    Referred to Alternative Service(s):   Place:   Date:   Time:    Referred to Alternative Service(s):   Place:   Date:    Time:     Vertell Novak, [facility] Comprehensive Clinical Assessment (CCA) Screening, Triage and Referral Note  05/23/2021 DIONISIA PACHOLSKI [id number]  Chief Complaint: No chief complaint on file.  Visit Diagnosis:   Patient Reported Information How did you hear about [place]? Legal System  What Is the Reason for Your Visit/Call Today? Pt reports, her father passed away, she was in a shelter and has been homeless for two days. Pt reports, she's suicidal with no plan, wants to hurt anybody with no access to weapons. Pt reports, hearing whispers.  How Long Has This Been Causing You Problems? 1 wk - 1 month  What Do You Feel Would Help You the Most Today? Treatment for Depression or other mood problem; Alcohol or Drug Use Treatment   Have You Recently Had Any Thoughts About Hurting Yourself? Yes  Are You Planning to Commit Suicide/Harm Yourself At This time? No   Have you Recently Had Thoughts About [place]? Yes  Are You Planning to Harm Someone at This Time? No  Explanation: No data recorded  Have You Used Any Alcohol or Drugs in the Past 24 Hours? Yes  How Long Ago Did You Use Drugs or Alcohol? No data recorded What Did You Use and How Much? Pt reports, using Cocaine 2-3 weeks ago, and using Methamphetamines twice and has overdosed.   Do You Currently Have a Therapist/Psychiatrist? Yes  Name of Therapist/Psychiatrist: Pt reports, she has an ACT Team.   Have You Been Recently Discharged From Any Office Practice or Programs? Yes  Explanation of Discharge From Practice/Program: Discharged from [facility] three days ago    CCA Screening Triage Referral Assessment Type of Contact: Face-to-Face  Telemedicine Service Delivery:   Is this Initial or Reassessment? Initial Assessment  Date Telepsych consult ordered in [facility]:  05/18/21  Time Telepsych consult ordered in [facility]:  1722  Location of Assessment: [facility] Assessment Services  Provider Location: [facility]  Assessment Services   Collateral Involvement: Pt denies, having supports.   Does Patient Have a Stage manager Guardian? No data recorded Name and Contact of Legal Guardian: No data recorded If Minor and Not Living with Parent(s), Who has Custody? N/A  Is CPS involved or ever been involved? Currently  Is APS involved or ever been involved? Never  Patient Determined To Be At Risk for Harm To Self or Others Based on Review of Patient Reported Information or Presenting Complaint? Yes, for Self-Harm  Method: No data recorded Availability of Means: No data recorded Intent: No data recorded Notification Required: No data recorded Additional Information for Danger to Others Potential: No data recorded Additional Comments for Danger to Others Potential: No data recorded Are There Guns or Other Weapons in Your Home? No data recorded Types of Guns/Weapons: No data recorded Are These Weapons Safely Secured?                            No data recorded Who Could Verify You Are Able To Have These Secured: No data recorded Do You Have any Outstanding Charges, Pending Court Dates, Parole/Probation? No data recorded Contacted To Inform of Risk of Harm To Self or Others: Unable to Contact:   Does Patient Present under Involuntary Commitment? No  IVC Papers Initial File Date: 03/07/21   [place] of Residence: [place]   Patient Currently Receiving the Following Services: ACTT Architect)   Determination of Need: Urgent (48 hours)   Options For Referral: Facility-Based Crisis; Inpatient Hospitalization; Medication Management; [facility]; Outpatient Therapy   Discharge Disposition:     Vertell Novak, [place], [place], [facility], [facility] Triage Specialist [phone]

## 2021-05-23 NOTE — Discharge Instructions (Addendum)

## 2021-05-23 NOTE — Progress Notes (Signed)
Julia [name] returned from [facility] via [employer] after a one hour wait relater to no beds at [facility].The staff is lookingfor a bed at one of the shelters. She was registered and waiting in the lobby.

## 2021-05-23 NOTE — Progress Notes (Signed)
   05/23/21 0600  Patient Reported Information  How Did You Hear About [place]? Legal System  What Is the Reason for Your Visit/Call Today? Pt reports, her father passed away, she was in a shelter and has been homeless for two days. Pt reports, she's suicidal with no plan, wants to hurt anybody with no access to weapons. Pt reports, hearing whispers.  How Long Has This Been Causing You Problems? 1 wk - 1 month  What Do You Feel Would Help You the Most Today? Treatment for Depression or other mood problem;Alcohol or Drug Use Treatment  Have You Recently Had Any Thoughts About Hurting Yourself? Yes  Are You Planning to Commit Suicide/Harm Yourself At This time? No  Have you Recently Had Thoughts About [place]? Yes  Are You Planning To Harm Someone At This Time? No  Have You Used Any Alcohol or Drugs in the Past 24 Hours? Yes  What Did You Use and How Much? Pt reports, using Cocaine 2-3 weeks ago, and using Methamphetamines twice and has overdosed.  Do You Currently Have a Therapist/Psychiatrist? Yes  Name of Therapist/Psychiatrist Pt reports, she has an ACT Team.  CCA Screening Triage Referral Assessment  Type of Contact Face-to-Face  Location of Assessment [facility] Assessment Services  Provider location [facility] Assessment Services  Collateral Involvement Pt denies, having supports.  Patient Determined To Be At Risk for Harm To Self or Others Based on Review of Patient Reported Information or Presenting Complaint? Yes, for Self-Harm  Does Patient Present under Involuntary Commitment? No  [place] of Residence [place]  Patient Currently Receiving the Following Services: ACTT Architect)  Determination of Need Urgent (48 hours)  Options For Referral Facility-Based Crisis;Inpatient Hospitalization;Medication Management;BH Urgent Care;Outpatient Therapy    Determination of need: Urgent.    Vertell Novak, [place], [facility], [facility] Triage Specialist [phone]

## 2021-05-23 NOTE — ED Provider Notes (Signed)
Julia [name] was transported to [facility] in [place] however there was no availability for shelter, she was ultimately returned to [facility] by safe transport.  She now request a bus pass to [facility] center, given bus pass by this Probation officer. Julia [name] reports she is familiar with the [facility] and plans to stop at the store to get cigarettes on the way to the [facility] center.

## 2021-05-25 ENCOUNTER — Other Ambulatory Visit: Payer: Self-pay

## 2021-05-25 ENCOUNTER — Ambulatory Visit (HOSPITAL_COMMUNITY)
Admission: EM | Admit: 2021-05-25 | Discharge: 2021-05-25 | Disposition: A | Discharge status: Home / Self Care | Payer: Medicaid Other

## 2021-05-25 ENCOUNTER — Emergency Department (HOSPITAL_COMMUNITY)
Admission: EM | Admit: 2021-05-25 | Discharge: 2021-05-25 | Disposition: A | Discharge status: Home / Self Care | Payer: Medicaid Other | Attending: Emergency Medicine | Admitting: Emergency Medicine

## 2021-05-25 ENCOUNTER — Encounter (HOSPITAL_COMMUNITY): Payer: Self-pay | Admitting: Emergency Medicine

## 2021-05-25 DIAGNOSIS — R45851 Suicidal ideations: Secondary | ICD-10-CM | POA: Insufficient documentation

## 2021-05-25 DIAGNOSIS — Z20822 Contact with and (suspected) exposure to covid-19: Secondary | ICD-10-CM | POA: Insufficient documentation

## 2021-05-25 DIAGNOSIS — Y900 Blood alcohol level of less than 20 mg/100 ml: Secondary | ICD-10-CM | POA: Insufficient documentation

## 2021-05-25 DIAGNOSIS — R443 Hallucinations, unspecified: Secondary | ICD-10-CM

## 2021-05-25 DIAGNOSIS — F17210 Nicotine dependence, cigarettes, uncomplicated: Secondary | ICD-10-CM | POA: Diagnosis not present

## 2021-05-25 DIAGNOSIS — N9489 Other specified conditions associated with female genital organs and menstrual cycle: Secondary | ICD-10-CM | POA: Insufficient documentation

## 2021-05-25 DIAGNOSIS — J101 Influenza due to other identified influenza virus with other respiratory manifestations: Secondary | ICD-10-CM | POA: Diagnosis not present

## 2021-05-25 DIAGNOSIS — F333 Major depressive disorder, recurrent, severe with psychotic symptoms: Secondary | ICD-10-CM | POA: Diagnosis not present

## 2021-05-25 DIAGNOSIS — F251 Schizoaffective disorder, depressive type: Secondary | ICD-10-CM

## 2021-05-25 LAB — COMPREHENSIVE METABOLIC PANEL
ALT: 38 U/L (ref 0–44)
AST: 24 U/L (ref 15–41)
Albumin: 4 g/dL (ref 3.5–5.0)
Alkaline Phosphatase: 82 U/L (ref 38–126)
Anion gap: 7 (ref 5–15)
BUN: 11 mg/dL (ref 6–20)
CO2: 27 mmol/L (ref 22–32)
Calcium: 8.7 mg/dL — ABNORMAL LOW (ref 8.9–10.3)
Chloride: 104 mmol/L (ref 98–111)
Creatinine, Ser: 0.73 mg/dL (ref 0.44–1.00)
GFR, Estimated: >60 mL/min (ref >60)
Glucose, Bld: 96 mg/dL (ref 70–99)
Potassium: 3.6 mmol/L (ref 3.5–5.1)
Sodium: 138 mmol/L (ref 135–145)
Total Bilirubin: 0.4 mg/dL (ref 0.3–1.2)
Total Protein: 7.9 g/dL (ref 6.5–8.1)

## 2021-05-25 LAB — CBC WITH DIFFERENTIAL/PLATELET
Abs Immature Granulocytes: 0.07 10*3/uL (ref 0.00–0.07)
Basophils Absolute: 0 10*3/uL (ref 0.0–0.1)
Basophils Relative: 0 %
Eosinophils Absolute: 0.1 10*3/uL (ref 0.0–0.5)
Eosinophils Relative: 1 %
HCT: 42.9 % (ref 36.0–46.0)
Hemoglobin: 13.5 g/dL (ref 12.0–15.0)
Immature Granulocytes: 1 %
Lymphocytes Relative: 45 %
Lymphs Abs: 3.1 10*3/uL (ref 0.7–4.0)
MCH: 27.8 pg (ref 26.0–34.0)
MCHC: 31.5 g/dL (ref 30.0–36.0)
MCV: 88.3 fL (ref 80.0–100.0)
Monocytes Absolute: 0.7 10*3/uL (ref 0.1–1.0)
Monocytes Relative: 11 %
Neutro Abs: 2.9 10*3/uL (ref 1.7–7.7)
Neutrophils Relative %: 42 %
Platelets: 276 10*3/uL (ref 150–400)
RBC: 4.86 MIL/uL (ref 3.87–5.11)
RDW: 13.9 % (ref 11.5–15.5)
WBC: 6.9 10*3/uL (ref 4.0–10.5)
nRBC: 0 % (ref 0.0–0.2)

## 2021-05-25 LAB — I-STAT BETA HCG BLOOD, ED (MC, WL, AP ONLY): I-stat hCG, quantitative: <5 m[IU]/mL (ref <5)

## 2021-05-25 LAB — RESP PANEL BY RT-PCR (FLU A&B, COVID) ARPGX2
Influenza A by PCR: POSITIVE — AB
Influenza B by PCR: NEGATIVE
SARS Coronavirus 2 by RT PCR: NEGATIVE

## 2021-05-25 LAB — ETHANOL: Alcohol, Ethyl (B): <10 mg/dL (ref <10)

## 2021-05-25 MED ORDER — OSELTAMIVIR PHOSPHATE 75 MG PO CAPS: 75.0000 mg | ORAL_CAPSULE | Freq: Two times a day (BID) | ORAL | Status: DC

## 2021-05-25 MED ORDER — PALIPERIDONE ER 6 MG PO TB24
6.0000 mg | ORAL_TABLET | Freq: Every day | ORAL | Status: DC
  Administered 2021-05-25: 6 mg via ORAL
  Filled 2021-05-25: qty 1

## 2021-05-25 NOTE — ED Notes (Signed)
Provided w/ discharge papers and given back pt belongings.

## 2021-05-25 NOTE — Progress Notes (Signed)
Transition of Care [facility]) - Emergency Department Mini Assessment   Patient Details  Name: Julia [name] MRN: [id number] Date of Birth: 18-Oct-2000  Transition of Care [facility]) CM/SW Contact:    Illene Regulus, LCSW Phone Number: 05/25/2021, 12:38 PM   Clinical Narrative:  TOC CSW was consulted due to pts frequent visits to ED. Pt has had multiple hospitalizations with the most recent one last week. Pt is also homeless. TOC CSW contacted homeless shelters to assist:  [facility], [facility] had no beds for females, [facility] [employer] had no response, left [facility], and [facility] [facility] stated she would have to call for a phone interview to start processes.   CSW spoke with pt, pt stated concerns about her current medication giving her hives. Per pt, she has not been on medication for a week, although pt was admitted to a behavioral health facility for the past week. Pt stated she gets her medication at [facility], ot stated she has an ACTT team, however, SW interned with her and stated she is no longer a client of the ACTT team. Pt stated she does not have any support she was kicked out of her friend's home and is unable to live with her aunt who she stated helps fill out paperwork. CSW tried to get more information on pt's living situation and provide shelter resources, however, pt started to cry and yell saying " the devil is at my feet" and " why are yall not helping me I need to be in a hospital"," what does that have to do with my medication" and other statements that did not make sense. CSW informed MD of pt's demeanor.    ED Mini Assessment: What brought you to the Emergency Department? : psch /homeless  Barriers to Discharge: No Barriers Identified             Patient Contact and Communications        ,                 Admission diagnosis:  psych eval Patient Active Problem List   Diagnosis Date Noted    Substance-induced disorder ([place]) 04/27/2021   Malingering 06/02/2020   Homicidal ideation 06/02/2020   Paranoia ([place]) 06/02/2020   Suicidal behavior 06/02/2020   Schizophrenia ([place]) 06/02/2020   Schizoaffective disorder ([place]) 05/08/2020   Schizophrenia, paranoid ([place])    Cocaine dependence with cocaine-induced psychotic disorder with complication [facility])    Social anxiety disorder 09/25/2016   Polysubstance abuse ([place])    Dysmenorrhea 12/30/2013   PCP:  [place], [place]:  No Pharmacies Listed

## 2021-05-25 NOTE — Progress Notes (Signed)
Per Oneida Alar, NP, patient is psych cleared. CSW added community resources on pt's AVS for the following: outpatient referrals for therapy/med management, and homeless shelter referrals.    Benjaman Kindler, MSW, [facility] 05/25/2021 4:54 PM
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
## 2021-05-25 NOTE — Discharge Instructions (Signed)

## 2021-05-25 NOTE — ED Provider Notes (Signed)
[facility] DEPT Provider Note   CSN: [id number] Arrival date & time: 05/25/21  1102     History Chief Complaint  Patient presents with   Suicidal    Julia [name] is a 20 y.o. female.  She has a history of schizophrenia has been off her medication for at least a week due to possible allergic reaction.  She was seen here 4 days ago for hallucinations and suicidal ideation.  She was seen earlier this morning at [facility] for same and psychiatrically cleared.  She is now presenting from the [facility] with a social worker complaining of continued auditory and visual hallucinations and suicidal thoughts.  The history is provided by the patient.  Mental Health Problem Presenting symptoms: hallucinations and suicidal thoughts   Patient accompanied by: Education officer, museum. Degree of incapacity (severity):  Unable to specify Onset quality:  Unable to specify Timing:  Constant Progression:  Unchanged Chronicity:  Chronic Context: noncompliance   Relieved by:  None tried Worsened by:  Nothing Ineffective treatments:  None tried Associated symptoms: no abdominal pain, no chest pain and no headaches   Risk factors: hx of mental illness and recent psychiatric admission       Past Medical History:  Diagnosis Date   Burning with urination 05/03/2015   Contraceptive management 05/03/2015   Depression    Dysmenorrhea 12/30/2013   Heroin addiction ([place])    Menorrhagia 12/30/2013   Menstrual extraction 12/30/2013   Migraines    Schizophrenia ([place])    Social anxiety disorder 09/25/2016   Suicidal ideations    Vaginal odor 05/03/2015    Patient Active Problem List   Diagnosis Date Noted   Substance-induced disorder ([place]) 04/27/2021   Malingering 06/02/2020   Homicidal ideation 06/02/2020   Paranoia ([place]) 06/02/2020   Suicidal behavior 06/02/2020   Schizophrenia ([place]) 06/02/2020   Schizoaffective disorder ([place]) 05/08/2020   Schizophrenia, paranoid ([place])    Cocaine  dependence with cocaine-induced psychotic disorder with complication ([place])    Social anxiety disorder 09/25/2016   Polysubstance abuse ([place])    Dysmenorrhea 12/30/2013    Past Surgical History:  Procedure Laterality Date   NO PAST SURGERIES       OB History     Gravida  0   Para      Term      Preterm      AB      Living         SAB      IAB      Ectopic      Multiple      Live Births              Family History  Problem Relation Age of Onset   Depression Mother    Hypertension Father    Hyperlipidemia Father    Cancer Paternal Grandmother        breast, uterine   Cirrhosis Paternal Grandfather        due to alcohol    Social History   Tobacco Use   Smoking status: Every Day    Packs/day: 0.50    Types: Cigarettes   Smokeless tobacco: Never  Vaping Use   Vaping Use: Never used  Substance Use Topics   Alcohol use: Yes   Drug use: Yes    Types: Oxycodone, Cocaine, Methamphetamines    Home Medications Prior to Admission medications   Medication Sig Start Date End Date Taking? Authorizing Provider  divalproex (DEPAKOTE ER) 250 MG  24 hr tablet Take 900 mg by mouth 2 (two) times daily.    [provider]  FLUoxetine (PROZAC) 40 MG capsule Take 40 mg by mouth daily.    [provider]  fluPHENAZine (PROLIXIN) 5 MG tablet Take 10 mg by mouth at bedtime. Patient not taking: No sig reported 04/18/21   [provider]  hydrOXYzine (ATARAX/VISTARIL) 50 MG tablet Take 50 mg by mouth 2 (two) times daily. Patient not taking: No sig reported 04/18/21   [provider]  paliperidone (INVEGA) 6 MG 24 hr tablet Take 6 mg by mouth daily.    [provider]  traZODone (DESYREL) 100 MG tablet Take 100 mg by mouth at bedtime. Patient not taking: No sig reported 04/18/21   [provider]    Allergies    Abilify [aripiprazole], Tylenol [acetaminophen], and Zoloft [sertraline hcl]  Review of Systems    Review of Systems  Constitutional:  Negative for fever.  HENT:  Negative for sore throat.   Eyes:  Negative for visual disturbance.  Respiratory:  Negative for shortness of breath.   Cardiovascular:  Negative for chest pain.  Gastrointestinal:  Negative for abdominal pain.  Genitourinary:  Negative for dysuria.  Skin:  Negative for rash.  Neurological:  Negative for headaches.  Psychiatric/Behavioral:  Positive for hallucinations and suicidal ideas.    Physical Exam Updated Vital Signs BP 115/78 (BP Location: Left Arm)   Pulse 98   Temp 98.3 F (36.8 C) (Oral)   Resp 16   SpO2 97%   Physical Exam Vitals and nursing note reviewed.  Constitutional:      General: She is not in acute distress.    Appearance: Normal appearance. She is well-developed.  HENT:     Head: Normocephalic and atraumatic.  Eyes:     Conjunctiva/sclera: Conjunctivae normal.  Cardiovascular:     Rate and Rhythm: Normal rate and regular rhythm.     Heart sounds: No murmur heard. Pulmonary:     Effort: Pulmonary effort is normal. No respiratory distress.     Breath sounds: Normal breath sounds.  Abdominal:     Palpations: Abdomen is soft.     Tenderness: There is no abdominal tenderness.  Musculoskeletal:        General: No deformity or signs of injury. Normal range of motion.     Cervical back: Neck supple.  Skin:    General: Skin is warm and dry.  Neurological:     General: No focal deficit present.     Mental Status: She is alert.     Gait: Gait normal.    ED Results / Procedures / Treatments   Labs (all labs ordered are listed, but only abnormal results are displayed) Labs Reviewed  RESP PANEL BY RT-PCR (FLU A&B, COVID) ARPGX2 - Abnormal; Notable for the following components:      Result Value   Influenza A by PCR POSITIVE (*)    All other components within normal limits  COMPREHENSIVE METABOLIC PANEL - Abnormal; Notable for the following components:   Calcium 8.7 (*)    All other  components within normal limits  ETHANOL  CBC WITH DIFFERENTIAL/PLATELET  RAPID URINE DRUG SCREEN, HOSP PERFORMED  I-STAT BETA HCG BLOOD, ED ([facility], [facility], [facility] ONLY)    EKG None  Radiology No results found.  Procedures Procedures   Medications Ordered in ED Medications - No data to display  ED Course  I have reviewed the triage vital signs and the nursing notes.  Pertinent labs & imaging results that were available during my care of the patient were reviewed by me and considered in my medical decision making (see chart for details).  Clinical Course as of 05/25/21 1929  Ludwig Clarks May 25, 2021  1350 Discussed with psychiatrist over secure message who reviewed the patient's behavioral health evaluation and felt that she may benefit from getting her act team involved.  Social work was consulted and reached out to the ACT team and found out that patient is not active with them right now.  We will place in a TTS consult then. [MB]  7939 Patient positive for influenza.  She said she does have a little bit of a sore throat and a cough and she would accept Tamiflu.  Pharmacy did a little more investigation found out she was positive over a week ago so probably out of the window for Tamiflu. [MB]    Clinical Course User Index [MB] Hayden Rasmussen, MD   MDM Rules/Calculators/A&P                          Patient here with suicidal ideation and auditory and visual hallucinations.  Chronic history of same.  Was just psychiatrically cleared less than 12 hours ago.  Homeless.  Social work unable to secure follow patient's act team.  We will proceed with TTS evaluation to see if they can help secure safe for outpatient follow-up with the patient or medication changes or if they are recommending inpatient treatment.  Patient agreeable to plan.  Final Clinical Impression(s) / ED Diagnoses Final diagnoses:  Suicidal ideation  Hallucinations  Influenza A    Rx / DC Orders ED Discharge Orders     None         Hayden Rasmussen, MD 05/25/21 1930

## 2021-05-25 NOTE — ED Triage Notes (Signed)
BIB IRC SW, pt voicing SI. States she has been off her meds for a week and a half, they make her itch. No specific plan. No HI. Also reports hallucinations, auditory and visual.

## 2021-05-25 NOTE — Discharge Instructions (Addendum)
Outpatient Psychiatry and Counseling  Therapeutic Alternatives: Mobile Crisis Management 24 hours:  [phone]  [facility] of the [employer] sliding scale fee and walk in schedule: M-F 8am-12pm/1pm-3pm [place], [place] [address] [place], [place] [address] [phone]  [facility] (Formerly known as [facility] [employer])- new patient walk-in appointments available Monday - Friday 8am -3pm.          [address] [place] [employer], [place] [address] [phone] or crisis line- [facility]/ Intensive Outpatient Therapy Program [place], [place] [address] [place]      [phone] [place], [place] [address]                 [place]   [facility] [phone]. [place], [place] [address]   [employer] of Care          [address] Johnette Abraham  [place], [place] [address]       [phone]  [place], [place], [place] [address] [phone]  [facility]    [address] [place], [place] [address]     [place], [place] Joycelyn Man     [place] [address]     [phone]       [facility] [place] [address]  [facility]     [address], [place], [place], [place] [address] [place]     [address]     [place], [place] [address]     [phone]       [facility] [address] [place], [place] [address] [phone] Resources for Temporary  Residential Assistance/Crisis [place] [facility]) M-F 8am-3pm   [address] [place], [place] [address]   [phone] Services include: laundry, barbering, support groups, case management, phone  & computer access, showers, AA/NA mtgs, mental health/substance abuse nurse, job skills class, disability information, VA assistance, spiritual classes, etc.   HOMELESS [facility] for Women in [place], [place] - [address]  [facility]     [facility]   [address] [place] [address], [place]     [place] (women and children)       [place]. [place], [place] [address] [phone] [email or web address] for application and process Application Required  [facility]   [address]    [place] [address]     [phone]                    [place], [place] [address] [phone]([email or web address] application appt.) Application Required  [employer] (women  only)    [place]     [place], [place] [address]     [phone]      Intake starts 6pm daily Need valid ID, SSC, & Police report [employer] [address] [place], [place] [phone] Application Required  [employer] (men only)     [place].      [place], [place]       [place] (Pregnant women only) [address] [place], [place]  [facility]      [place] [address].      [place], [place] [address]     [phone]             [facility] [address] [place], [place] 90 day commitment/SA/Application process  [facility](men only)     [address]     [place], [place]       Check-in at [facility] [address] [place], [place] [address] [phone] Men/Women/Women and Children must be there by 7 pm  [place], [place]

## 2021-05-25 NOTE — ED Notes (Signed)
Pt belongings removed, allowed to keep her sandwich and a blanket.  Dressed in purple scrubs, non-slip socks given. Security to wand pt.

## 2021-05-25 NOTE — Progress Notes (Signed)
   05/25/21 0239  [place] (Walk-ins at [facility] only)  How Did You Hear About [place]? Self  What Is the Reason for Your Visit/Call Today? Julia [name] is a 20 year old female presenting voluntary to [facility] due to delusions. Patient reported feeling "devil pushed up from ground and knocked 3x" and "God said that he is getting me a chariot". Patient reported being homeless. Patient asked TTS clinician to bring her a salad and if she could lay on couch in the next assessment room. Patient denied SI, however when asked about past suicide attempts, patient stated "I am on the verge, I would use a noose". Patient reported smoking marijuana "I smoked week not long ago". Patient continued to deny SI and contracted for safety. Patient reported being at [facility] for inpatient treatment approx 1 week. Patient reported she did not follow up with [name] for outpatient treatment after being seen on 05/23/21. Patient was sleepy and cooperative during assessment.  How Long Has This Been Causing You Problems? 1 wk - 1 month  Have You Recently Had Any Thoughts About Hurting Yourself? No  How long ago did you have thoughts about hurting yourself? denied  Are You Planning to [place] At This time? No  Have you Recently Had Thoughts About [place]? No  How long ago did you have thoughts of harming others? Earlier, no specific time frame and no plan.  Are You Planning To Harm Someone At This Time? No  Are you currently experiencing any auditory, visual or other hallucinations? Yes (delusions)  Please explain the hallucinations you are currently experiencing: "the devil is pushing up from ground and knocking 3x"  Have You Used Any Alcohol or Drugs in the Past 24 Hours? Yes  How long ago did you use Drugs or Alcohol? marijuana  What Did You Use and How Much? "I smoked some weed not too long ago"  Do you have any current medical co-morbidities that require immediate attention? No   Clinician description of patient physical appearance/behavior: disheveled/calm  What Do You Feel Would Help You the Most Today? Treatment for Depression or other mood problem;Food Assistance;Housing Assistance  If access to [facility] was not available, would you have sought care in the Emergency Department? Yes  Determination of Need Routine (7 days)  Options For Referral Outpatient Therapy;Medication Management

## 2021-05-25 NOTE — ED Provider Notes (Signed)
Behavioral Health Urgent Care Medical Screening Exam  Patient Name: Julia [name] MRN: [id number] Date of Evaluation: 05/25/21 Chief Complaint:   Diagnosis:  Final diagnoses:  Schizoaffective disorder, depressive type ([place])    History of Present illness: Julia [name] is a 20 year old female with psychiatric history of schizoaffective disorder, bipolar type.  Patient presented voluntarily to [facility] with a chief complaint of hallucination.   Patient was seen face-to-face and her chart was reviewed by this provider.  Patient is alert and oriented x4, she is calm and cooperative.  Patient is speaking in normal voice at moderate rate with good eye contacts.  Patient's mood is euthymic with congruent affect.   Patient reports that she has not been taking her medications since discharging from inpatient psychiatric stay at [facility] approximately 1.5 week ago. She report that she has not picked up her medication from the pharmacy. She is unable to identify any obstacle that is  (I.e financial/transportation) preventing her from picking up are medications. She endorse auditory hallucination of "hearing knocking sounds." She states "I heard the devil knocking on the ground." She denies current hallucination. She says auditory hallucination is chronic and has not worsen. She reports that she has contact information for her ACT Team and does not need assistance with contacting her ACT Team.   She is able to contract for safety and denies suicidal ideation, homicidal ideation, paranoia, visual hallucination with this Probation officer. She endorses hx of polysubstance abuse however she is unable to state when she last engaged in substance abuse. Patient reports that she is currently homeless; she is requesting assistance with housing and food.    Psychiatric Specialty Exam  Presentation  General Appearance:Casual  Eye Contact:Good  Speech:Clear and Coherent  Speech Volume:Normal  Handedness:Right   Mood  and Affect  Mood:Euthymic  Affect:Appropriate; Congruent   Thought Process  Thought Processes:Coherent  Descriptions of Associations:Intact  Orientation:Full (Time, Place and Person)  Thought Content:WDL  Diagnosis of Schizophrenia or Schizoaffective disorder in past: Yes  Duration of Psychotic Symptoms: Greater than six months  Hallucinations:None  Ideas of Reference:None  Suicidal Thoughts:No  Homicidal Thoughts:No   Sensorium  Memory:Immediate Good; Recent Fair; Remote Fair  Judgment:Fair  Insight:Fair   Executive Functions  Concentration:Good  Attention Span:Good  [place]  Language:Good   Psychomotor Activity  Psychomotor Activity:Normal   Assets  Assets:Desire for Improvement; Armed forces logistics/support/administrative officer; Physical Health   Sleep  Sleep:Poor  Number of hours: 3   No data recorded  Physical Exam: Physical Exam Vitals and nursing note reviewed.  Constitutional:      General: She is not in acute distress.    Appearance: She is well-developed.  HENT:     Head: Normocephalic and atraumatic.  Eyes:     Conjunctiva/sclera: Conjunctivae normal.  Cardiovascular:     Rate and Rhythm: Normal rate and regular rhythm.     Heart sounds: No murmur heard. Pulmonary:     Effort: Pulmonary effort is normal. No respiratory distress.     Breath sounds: Normal breath sounds.  Abdominal:     Palpations: Abdomen is soft.     Tenderness: There is no abdominal tenderness.  Musculoskeletal:     Cervical back: Neck supple.  Skin:    General: Skin is warm and dry.  Neurological:     Mental Status: She is alert and oriented to person, place, and time.  Psychiatric:        Attention and Perception: Attention normal. She perceives auditory  hallucinations.        Mood and Affect: Mood normal.        Speech: Speech normal.        Behavior: Behavior normal. Behavior is cooperative.        Thought Content: Thought content normal.         Cognition and Memory: Cognition normal.   Review of Systems  Constitutional: Negative.   HENT: Negative.    Eyes: Negative.   Respiratory: Negative.    Cardiovascular: Negative.   Gastrointestinal: Negative.   Genitourinary: Negative.   Musculoskeletal: Negative.   Skin: Negative.   Neurological: Negative.   Endo/Heme/Allergies: Negative.   Psychiatric/Behavioral: Negative.    Blood pressure 127/76, pulse (!) 117, temperature 98.3 F (36.8 C), temperature source Oral, resp. rate 18, SpO2 100 %. There is no height or weight on file to calculate BMI.  Musculoskeletal: Strength & Muscle Tone: within normal limits Gait & Station: normal Patient leans: Right   [place] MSE Discharge Disposition for Follow up and Recommendations: Based on my evaluation the patient does not appear to have an emergency medical condition and can be discharged with resources and follow up care in outpatient services for Medication Management and Individual Therapy  Patient provide with contact information for ACT Team and give information for shelters. Return precaution reviewed with patient.    Nolan Lasser A Timmothy Baranowski, NP 05/25/2021, 6:00 AM

## 2021-05-29 ENCOUNTER — Telehealth (HOSPITAL_COMMUNITY): Payer: Self-pay

## 2021-05-29 NOTE — BH Assessment (Signed)
Care Management - Follow Up [facility] Discharges   Writer attempted to make contact with patient today and was unsuccessful.   Patient does not have a phone number listed in epic.   Per chart review, patient will follow up with her established ACTT Team and given information for homeless shelters.

## 2021-06-04 IMAGING — CR DG CHEST 2V
2 series · 2 of 2 positions shown · non-contrast
Comparison: 10/25/2011

CLINICAL DATA: Chest tightness

EXAM:
CHEST - 2 VIEW

[w chest pa]
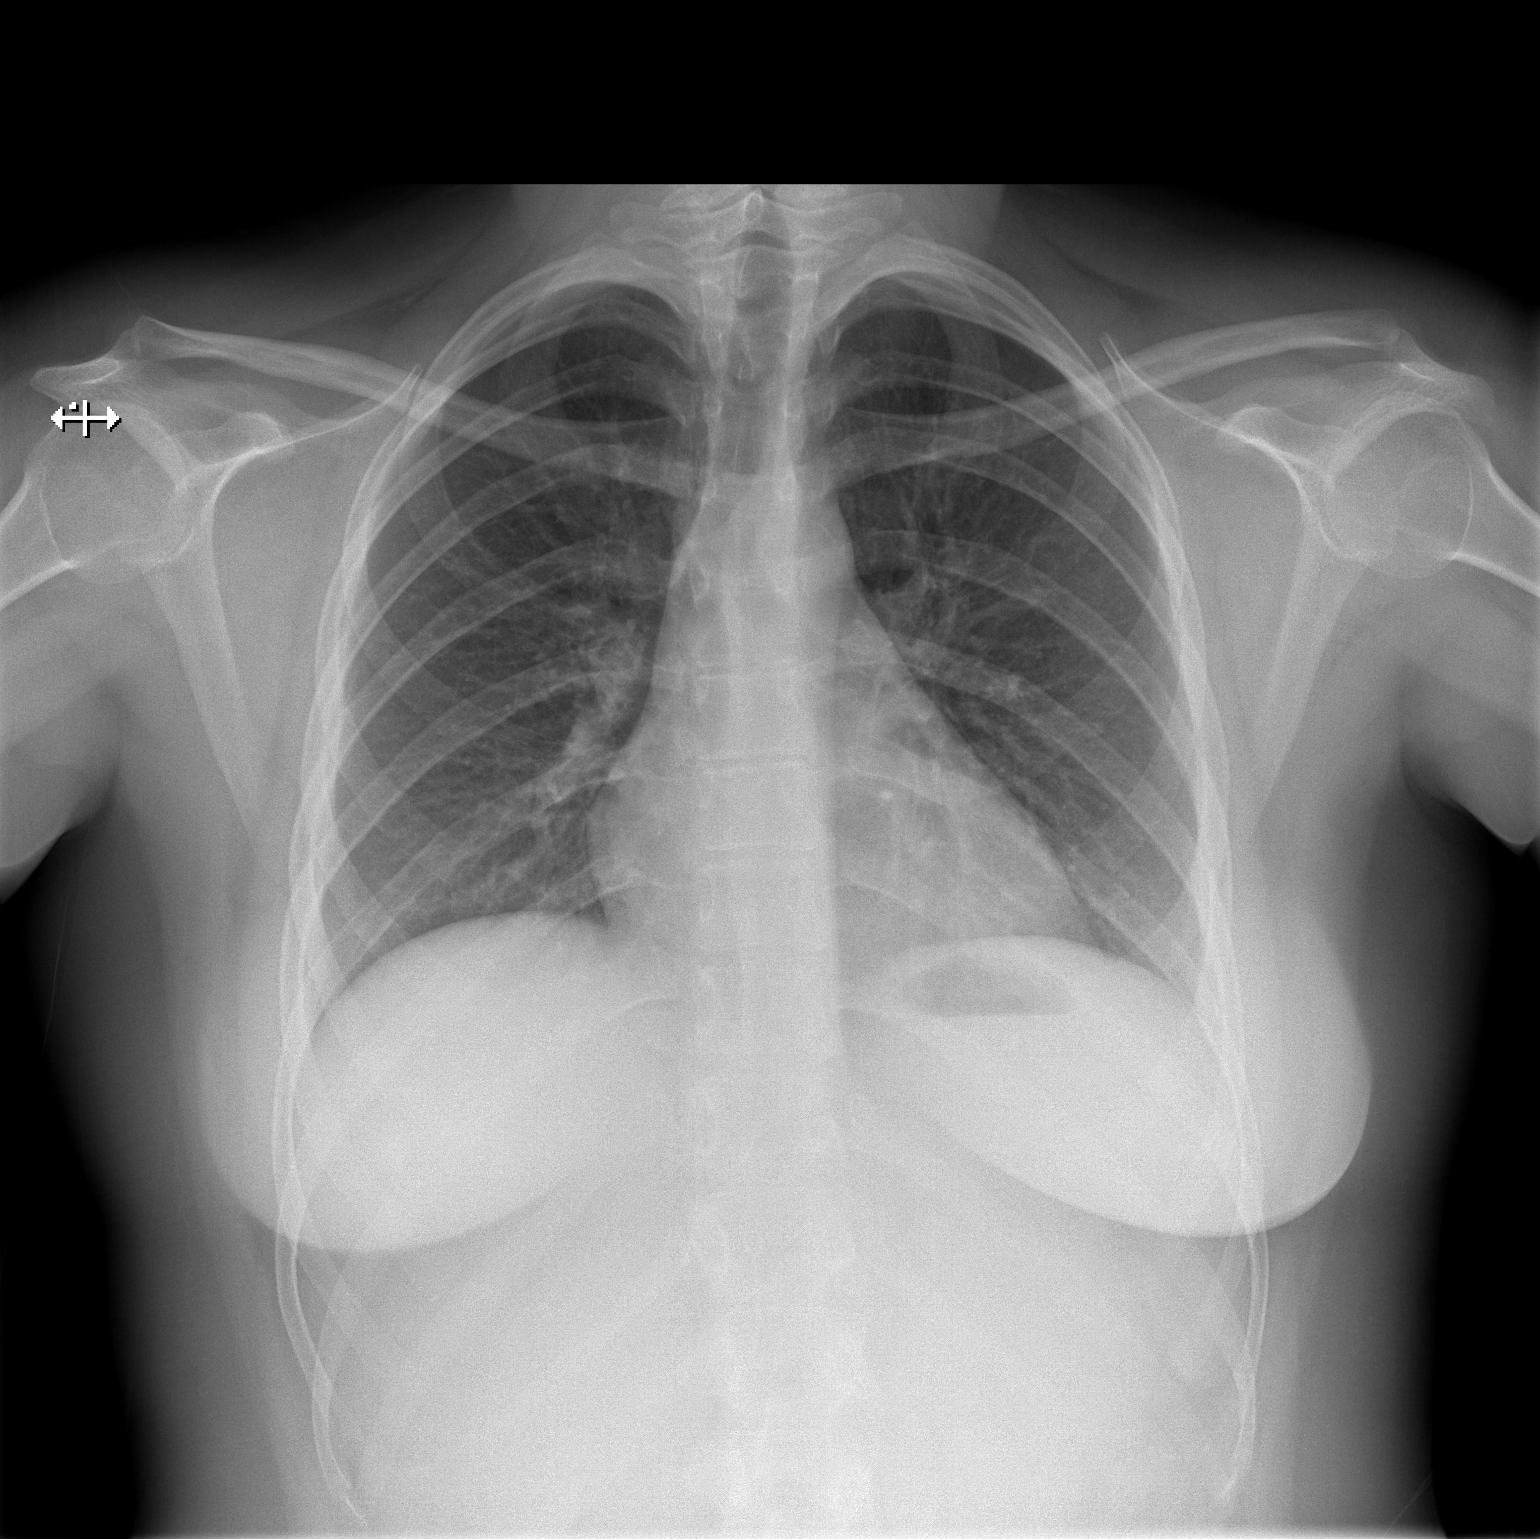

[w chest lat]
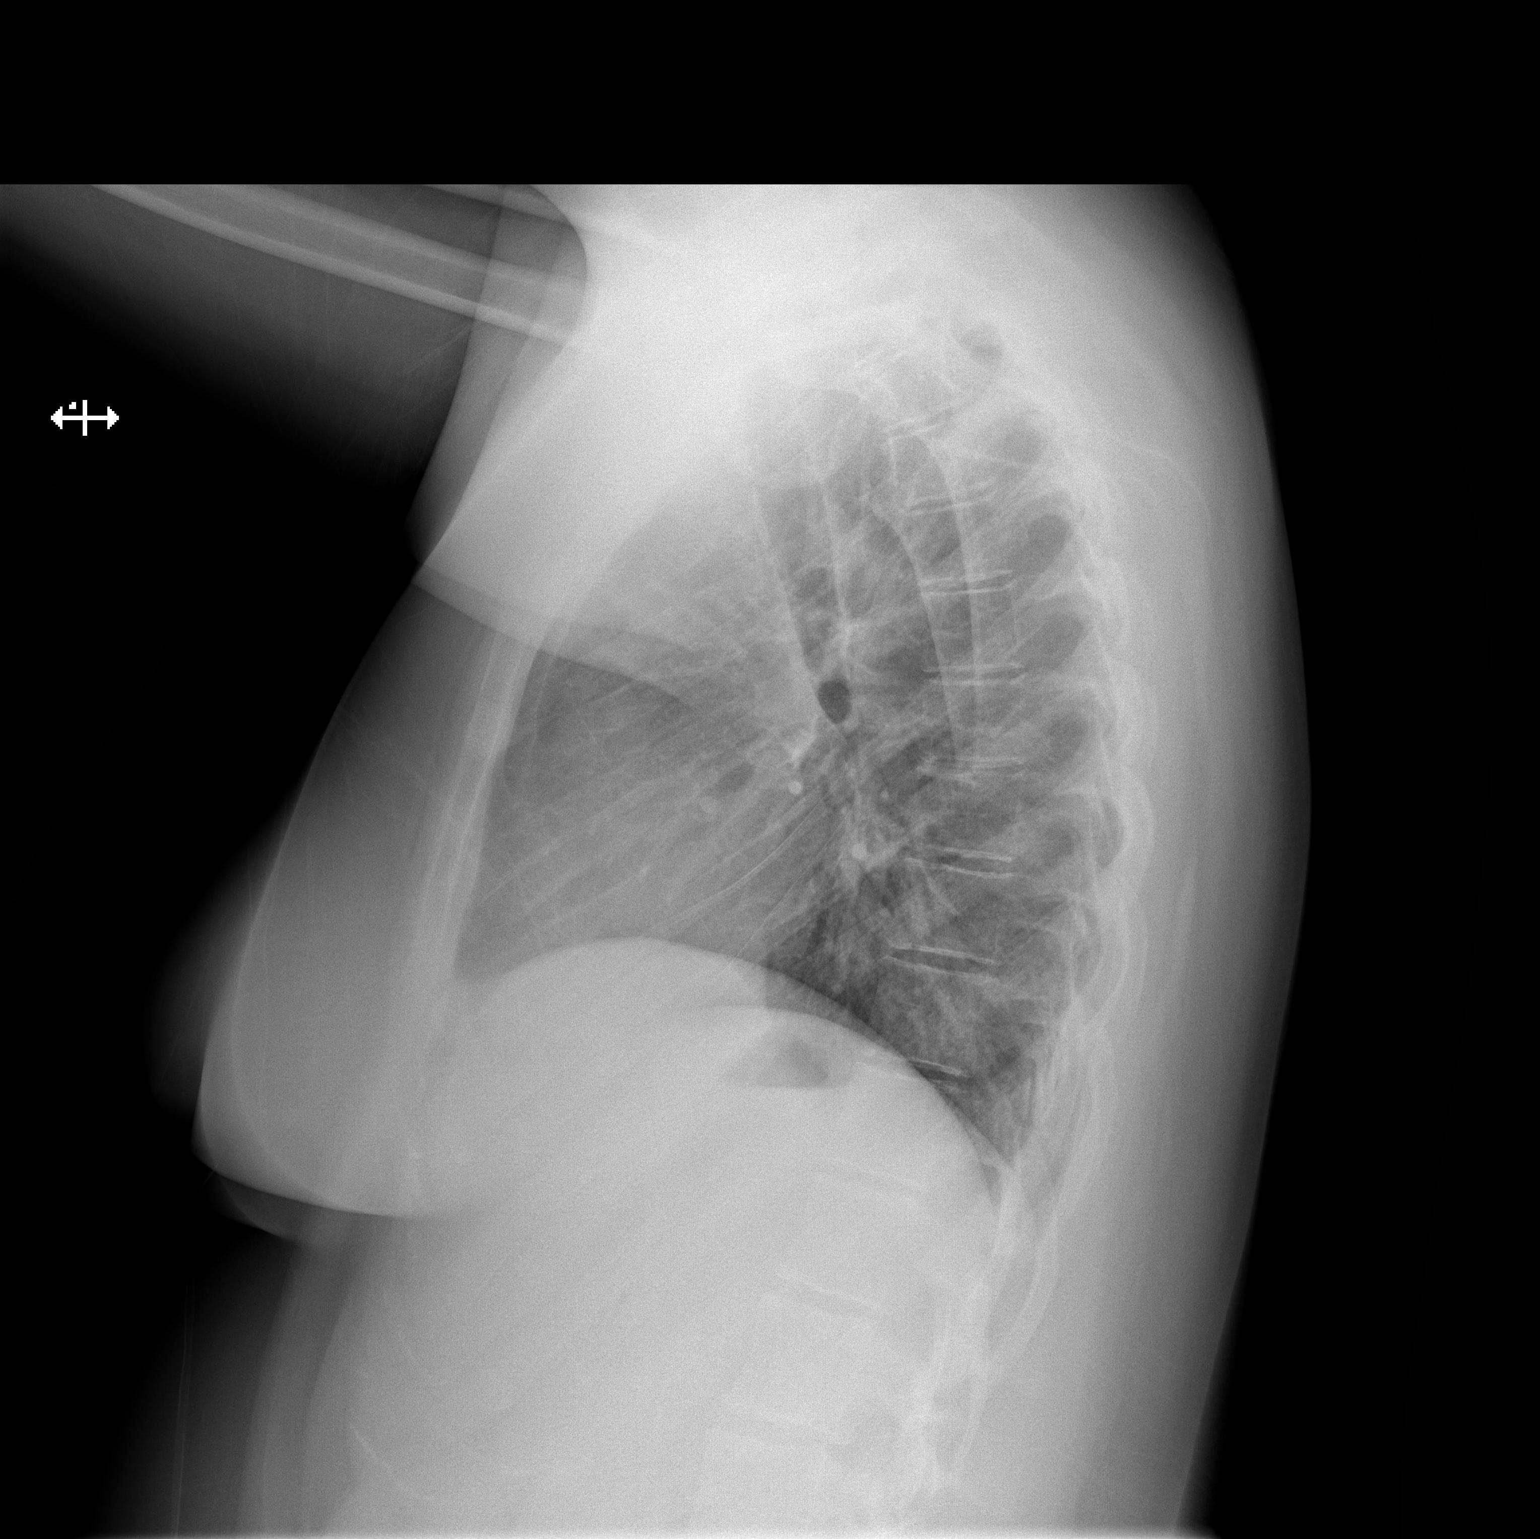

[2 of 2 positions shown; findings below may reference images not displayed]

FINDINGS: The heart size and mediastinal contours are within normal limits.
Both lungs are clear. The visualized skeletal structures are
unremarkable.
IMPRESSION: No active cardiopulmonary disease.

## 2021-08-08 NOTE — Congregational Nurse Program (Signed)
°  Dept: [phone]   Congregational Nurse Program Note  Date of Encounter: 08/08/2021  Past Medical History: Past Medical History:  Diagnosis Date   Burning with urination 05/03/2015   Contraceptive management 05/03/2015   Depression    Dysmenorrhea 12/30/2013   Heroin addiction ([place])    Menorrhagia 12/30/2013   Menstrual extraction 12/30/2013   Migraines    Schizophrenia ([place])    Social anxiety disorder 09/25/2016   Suicidal ideations    Vaginal odor 05/03/2015    Encounter Details:  CNP Questionnaire - 08/08/21 1451       Questionnaire   Do you give verbal consent to treat you today? Yes    Location Patient [name], [name] or Organization    Patient Status Homeless    Insurance [name]    Insurance Referral N/A    Medication N/A    Medical Provider No    Screening Referrals N/A    Medical Referral Other    Medical Appointment Made N/A    Food Have Food Insecurities    Transportation N/A    Housing/Utilities N/A    Interpersonal Safety N/A    Intervention Blood pressure    ED Visit Averted N/A    Life-Saving Intervention Made N/A           Stated she was not felling well this evening. C/O headache Temp 97.5 BP 111/78  P 106 W[facility] to be [facility] at [facility]. Told will Schedule an appointment. Stated she also has history of  UTI Erma Heritage RN

## 2021-08-25 ENCOUNTER — Inpatient Hospital Stay (HOSPITAL_COMMUNITY): Admit: 2021-08-25 | Payer: Medicaid Other | Admitting: Psychiatry

## 2021-08-25 ENCOUNTER — Other Ambulatory Visit: Payer: Self-pay

## 2021-08-25 ENCOUNTER — Emergency Department (HOSPITAL_COMMUNITY)
Admission: EM | Admit: 2021-08-25 | Discharge: 2021-08-26 | Disposition: A | Discharge status: Home / Self Care | Payer: Medicaid Other | Attending: Emergency Medicine | Admitting: Emergency Medicine

## 2021-08-25 ENCOUNTER — Encounter (HOSPITAL_COMMUNITY): Payer: Self-pay

## 2021-08-25 DIAGNOSIS — R45851 Suicidal ideations: Secondary | ICD-10-CM | POA: Diagnosis not present

## 2021-08-25 DIAGNOSIS — F259 Schizoaffective disorder, unspecified: Secondary | ICD-10-CM | POA: Insufficient documentation

## 2021-08-25 DIAGNOSIS — F17210 Nicotine dependence, cigarettes, uncomplicated: Secondary | ICD-10-CM | POA: Insufficient documentation

## 2021-08-25 DIAGNOSIS — F32A Depression, unspecified: Secondary | ICD-10-CM | POA: Diagnosis not present

## 2021-08-25 DIAGNOSIS — F23 Brief psychotic disorder: Secondary | ICD-10-CM | POA: Insufficient documentation

## 2021-08-25 LAB — COMPREHENSIVE METABOLIC PANEL
ALT: 15 U/L (ref 0–44)
AST: 19 U/L (ref 15–41)
Albumin: 4.3 g/dL (ref 3.5–5.0)
Alkaline Phosphatase: 75 U/L (ref 38–126)
Anion gap: 10 (ref 5–15)
BUN: 8 mg/dL (ref 6–20)
CO2: 25 mmol/L (ref 22–32)
Calcium: 9.1 mg/dL (ref 8.9–10.3)
Chloride: 103 mmol/L (ref 98–111)
Creatinine, Ser: 0.75 mg/dL (ref 0.44–1.00)
GFR, Estimated: >60 mL/min (ref >60)
Glucose, Bld: 94 mg/dL (ref 70–99)
Potassium: 3.7 mmol/L (ref 3.5–5.1)
Sodium: 138 mmol/L (ref 135–145)
Total Bilirubin: 0.4 mg/dL (ref 0.3–1.2)
Total Protein: 7.6 g/dL (ref 6.5–8.1)

## 2021-08-25 LAB — CBC
HCT: 43.5 % (ref 36.0–46.0)
Hemoglobin: 13.2 g/dL (ref 12.0–15.0)
MCH: 27.2 pg (ref 26.0–34.0)
MCHC: 30.3 g/dL (ref 30.0–36.0)
MCV: 89.5 fL (ref 80.0–100.0)
Platelets: 263 10*3/uL (ref 150–400)
RBC: 4.86 MIL/uL (ref 3.87–5.11)
RDW: 13.8 % (ref 11.5–15.5)
WBC: 8.5 10*3/uL (ref 4.0–10.5)
nRBC: 0 % (ref 0.0–0.2)

## 2021-08-25 LAB — ETHANOL: Alcohol, Ethyl (B): <10 mg/dL (ref <10)

## 2021-08-25 LAB — SALICYLATE LEVEL: Salicylate Lvl: <7 mg/dL — ABNORMAL LOW (ref 7.0–30.0)

## 2021-08-25 LAB — ACETAMINOPHEN LEVEL: Acetaminophen (Tylenol), Serum: <10 ug/mL — ABNORMAL LOW (ref 10–30)

## 2021-08-25 MED ORDER — PALIPERIDONE ER 6 MG PO TB24
6.0000 mg | ORAL_TABLET | Freq: Every day | ORAL | Status: DC
  Administered 2021-08-26: 6 mg via ORAL
  Filled 2021-08-25 (×2): qty 1

## 2021-08-25 NOTE — ED Provider Notes (Addendum)
[facility] EMERGENCY DEPARTMENT Provider Note   CSN: [id number] Arrival date & time: 08/25/21  1506     History  Chief Complaint  Patient presents with   Suicidal    Julia [name] is a 21 y.o. female who presents voluntarily with concern for suicidal ideation.  Patient states that she wanted to kill herself today so she went to someone at home that she knows and asked them for either "a gun or a rope" but they told her that it was "not a good thing to do" so she left their house.  She states that she presented to the place department reportedly to follow-up on the different case and shared with them that she was feeling suicidal and they directed her to the emergency department.  She endorses [facility] stating that the "illuminati talks to me" and that they "send me text messages which I received in my brain".  Patient does appear to be responding to internal stimuli throughout exam.  She states that "the people in [place] are after me".  Have personally reviewed this patient's medical records.  She is currently living in a shelter in [place].  She has history of schizoaffective disorder, polysubstance abuse with history of cocaine and heroin use in the past, history of paranoia.  She is supposed to be on Depakote, Prozac, fluphenazine, and paliperidone, however it is unclear if she remains on these medications at this time.  Level 5 caveat due to acute psychosis.   HPI     Home Medications Prior to Admission medications   Medication Sig Start Date End Date Taking? Authorizing Provider  divalproex (DEPAKOTE ER) 250 MG 24 hr tablet Take 900 mg by mouth 2 (two) times daily. Patient not taking: Reported on 05/25/2021    [provider]  FLUoxetine (PROZAC) 40 MG capsule Take 40 mg by mouth daily. Patient not taking: Reported on 05/25/2021    [provider]  fluPHENAZine (PROLIXIN) 5 MG tablet Take 10 mg by mouth at bedtime. Patient not taking: Reported on 05/25/2021  04/18/21   [provider]  hydrOXYzine (ATARAX/VISTARIL) 50 MG tablet Take 50 mg by mouth 2 (two) times daily. Patient not taking: Reported on 05/25/2021 04/18/21   [provider]  paliperidone (INVEGA) 6 MG 24 hr tablet Take 6 mg by mouth daily. Patient not taking: Reported on 05/25/2021    [provider]  traZODone (DESYREL) 100 MG tablet Take 100 mg by mouth at bedtime. Patient not taking: Reported on 05/25/2021 04/18/21   [provider]      Allergies    Abilify [aripiprazole], Tylenol [acetaminophen], and Zoloft [sertraline hcl]    Review of Systems   Review of Systems  Constitutional: Negative.   HENT: Negative.    Eyes: Negative.   Respiratory: Negative.    Cardiovascular: Negative.   Gastrointestinal: Negative.   Genitourinary: Negative.   Musculoskeletal: Negative.   Neurological: Negative.   Hematological: Negative.   Psychiatric/Behavioral:  Positive for suicidal ideas.    Physical Exam Updated Vital Signs BP 124/72 (BP Location: Right Arm)    Pulse 100    Temp 98 F (36.7 C) (Oral)    Resp 17    Ht 5\' 3"  (1.6 m)    Wt 77.1 kg    LMP 08/20/2021    SpO2 96%    BMI 30.11 kg/m  Physical Exam Vitals and nursing note reviewed.  Constitutional:      Appearance: She is not toxic-appearing.  HENT:  Head: Normocephalic and atraumatic.     Mouth/Throat:     Mouth: Mucous membranes are moist.     Pharynx: No oropharyngeal exudate or posterior oropharyngeal erythema.  Eyes:     General:        Right eye: No discharge.        Left eye: No discharge.     Conjunctiva/sclera: Conjunctivae normal.  Cardiovascular:     Rate and Rhythm: Normal rate and regular rhythm.     Pulses: Normal pulses.  Pulmonary:     Effort: Pulmonary effort is normal. No respiratory distress.     Breath sounds: Normal breath sounds. No wheezing or rales.  Abdominal:     General: Bowel sounds are normal. There is no distension.     Palpations: Abdomen is  soft.     Tenderness: There is no abdominal tenderness. There is no guarding or rebound.  Musculoskeletal:        General: No deformity.     Cervical back: Neck supple.  Skin:    General: Skin is warm and dry.     Capillary Refill: Capillary refill takes less than 2 seconds.  Neurological:     General: No focal deficit present.     Mental Status: She is alert and oriented to person, place, and time. Mental status is at baseline.  Psychiatric:        Attention and Perception: Attention normal.        Mood and Affect: Mood normal.        Thought Content: Thought content is paranoid. Thought content includes suicidal ideation. Thought content does not include homicidal ideation. Thought content includes suicidal plan.     Comments: Appears to be responding to internal stimuli at this time.     ED Results / Procedures / Treatments   Labs (all labs ordered are listed, but only abnormal results are displayed) Labs Reviewed  SALICYLATE LEVEL - Abnormal; Notable for the following components:      Result Value   Salicylate Lvl <9.5 (*)    All other components within normal limits  ACETAMINOPHEN LEVEL - Abnormal; Notable for the following components:   Acetaminophen (Tylenol), Serum <10 (*)    All other components within normal limits  COMPREHENSIVE METABOLIC PANEL  ETHANOL  CBC  RAPID URINE DRUG SCREEN, HOSP PERFORMED  PREGNANCY, URINE    EKG None  Radiology No results found.  Procedures Procedures    Medications Ordered in ED Medications - No data to display  ED Course/ Medical Decision Making/ A&P                           Medical Decision Making 21 year old female with history of schizoaffective disorder who presents with suicidality and paranoia.   Vital signs are normal and intake.  Cardiopulmonary and abdominal exams are benign.  Patient does appear to be responding to internal stimuli and is suicidal without homicidal ideation.   Amount and/or Complexity of  Data Reviewed Labs: ordered.    Details: CBC unremarkable.  CMP unremarkable.  Salicylate, alcohol, and acetaminophen levels all normal.  Patient yet to provide urine specimen.  Risk Prescription drug management.   Patient medically cleared at this time for psychiatric evaluation.  She remains voluntary at this time and is currently homeless.  No IVC to be placed at this time as she is cooperative and voluntary in the ED.  Pending TTS evaluation at time of shift change.  She is resting, comfortably in her bed at this time.  Patient has multiple psychiatric medications listed in her chart.  She does state that she is not currently on [place].  We will order daily Invega but will hold off on any further medications until psychiatric evaluation.   This chart was dictated using voice recognition software, Dragon. Despite the best efforts of this provider to proofread and correct errors, errors may still occur which can change documentation meaning.  Final Clinical Impression(s) / ED Diagnoses Final diagnoses:  None    Rx / DC Orders ED Discharge Orders     None         Emeline Darling, PA-C 08/25/21 1803    Elvyn Krohn, Gypsy Balsam, PA-C 08/25/21 1808    Fredia Sorrow, MD 08/26/21 1335

## 2021-08-25 NOTE — ED Triage Notes (Signed)
Pt here voluntarily for cc of feeling suicidal with a plan to shoot herself but was unable to find a weapon.  Has not followup with psych. Says she has been taking her mediation.

## 2021-08-25 NOTE — ED Notes (Signed)
Pt getting TTS 

## 2021-08-25 NOTE — ED Notes (Signed)
Pt in purple scrubs. One bag of belongings placed on top shelf of locker room.

## 2021-08-25 NOTE — BH Assessment (Signed)
Comprehensive Clinical Assessment (CCA) Note  08/25/2021 Julia [name] [id number]  Disposition: Dr. Darlyne Russian, PA, recommends overnight observation for safety and stabilization with psych reassessment in the AM. Darl Pikes, RN, informed of disposition.  The patient demonstrates the following risk factors for suicide: Chronic risk factors for suicide include: psychiatric disorder of schizoaffective disdorder and previous suicide attempts 1 year ago cut self with intentions of suicide . Acute risk factors for suicide include: social withdrawal/isolation and loss (financial, interpersonal, professional). Protective factors for this patient include: positive social support, responsibility to others (children, family), coping skills, and hope for the future. Considering these factors, the overall suicide risk at this point appears to be high. Patient is not appropriate for outpatient follow up.   Chief Complaint:  Chief Complaint  Patient presents with   Suicidal   Julia [name] is a 21 year old female presenting voluntary to [facility] due to [place] with plan to shoot self. Patient has history of schizoaffective disorder. Patient reported onset of 2-3 days ago. When asked about stressors/triggers, patient stated, "hearing voices about the illuminati and text message that say what's up". Patient then stated, "I am being black-balled by someone in [place]". Per EDP note, patient wanted to kill herself today so she wnt to someone at home and asked for either "a gun or a rope" and she was told "it was not a good thing to do". Patient reported worsening depressive symptoms. Patient reported history of suicide attempt 1 year ago by cutting herself. Patient denied having a therapist and stated she had a psychiatrist but couldn't think of the name and then denied being on psych medications. During assessment patient began beating her hands on table stating, "I am going to kill someone, they making [name] grow a dick, I  am going to kill myself, there is no other option, I am going to myself". Patient escalated and stated "I can't do this, I can't do this no more, I can't talk you".    Visit Diagnosis:  Schizoaffective Disorder   CCA Screening, Triage and Referral (STR)  Patient Reported Information How did you hear about [place]? Self  What Is the Reason for Your Visit/Call Today? SI with plan to shoot self and visual and auditory hallucinations.  How Long Has This Been Causing You Problems? <Week  What Do You Feel Would Help You the Most Today? Treatment for Depression or other mood problem   Have You Recently Had Any Thoughts About Hurting Yourself? Yes  Are You Planning to Commit Suicide/Harm Yourself At This time? Yes   Have you Recently Had Thoughts About Hurting Someone [name]? Yes  Are You Planning to Harm Someone at This Time? Yes  Explanation: uta   Have You Used Any Alcohol or Drugs in the Past 24 Hours? No  How Long Ago Did You Use Drugs or Alcohol? No data recorded What Did You Use and How Much? Patient reports use of cocaine. Age of first use is 21 y/o. She uses cocaine every day. Average amount of use is cocaine. Duration of use is 3 months. Last use was 2 days ago; 1 gram.   Patient reports use of alcohol. She started drinking at the age of 21 yrs old. She drinks alcohol every other day. Average amt of use is 2-3 bootleggers. Last use was last night, #1 [place]ff.   Do You Currently Have a Therapist/Psychiatrist? Yes  Name of Therapist/Psychiatrist: psychiatrist name unknown   Have You Been Recently Discharged From Any Office  Practice or Programs? No  Explanation of Discharge From Practice/Program: Discharged from [facility] three days ago     CCA Screening Triage Referral Assessment Type of Contact: Tele-Assessment  Telemedicine Service Delivery:   Is this Initial or Reassessment? Initial Assessment  Date Telepsych consult ordered in [facility]:  08/25/21  Time  Telepsych consult ordered in [facility]:  1658  Location of Assessment: [facility]  Provider Location: [facility] Assessment Services   Collateral Involvement: none reported   Does Patient Have a [place]? No data recorded Name and Contact of Legal Guardian: No data recorded If Minor and Not Living with Parent(s), Who has Custody? N/A  Is CPS involved or ever been involved? Never  Is APS involved or ever been involved? Never   Patient Determined To Be At Risk for Harm To Self or Others Based on Review of Patient Reported Information or Presenting Complaint? Yes, for Self-Harm  Method: No data recorded Availability of Means: No data recorded Intent: No data recorded Notification Required: No data recorded Additional Information for Danger to Others Potential: No data recorded Additional Comments for Danger to Others Potential: No data recorded Are There Guns or Other Weapons in Your Home? No data recorded Types of Guns/Weapons: No data recorded Are These Weapons Safely Secured?                            No data recorded Who Could Verify You Are Able To Have These Secured: No data recorded Do You Have any Outstanding Charges, Pending Court Dates, Parole/Probation? No data recorded Contacted To Inform of Risk of Harm To Self or Others: Unable to Contact:    Does Patient Present under Involuntary Commitment? No  IVC Papers Initial File Date: 03/07/21   [place] of Residence: [place]   Patient Currently Receiving the Following Services: Medication Management   Determination of Need: Emergent (2 hours)   Options For Referral: Medication Management; Outpatient Therapy; Inpatient Hospitalization     CCA Biopsychosocial Patient Reported Schizophrenia/Schizoaffective Diagnosis in Past: Yes   Strengths: Pt expressed wanting help.   Mental Health Symptoms Depression:   Change in energy/activity; Fatigue; Difficulty Concentrating; Worthlessness;  Hopelessness; Irritability   Duration of Depressive symptoms:    Mania:   Recklessness; Change in energy/activity; Racing thoughts   Anxiety:    Difficulty concentrating; Worrying; Tension   Psychosis:   Hallucinations   Duration of Psychotic symptoms:  Duration of Psychotic Symptoms: Less than six months   Trauma:   Avoids reminders of event; Emotional numbing   Obsessions:   None   Compulsions:   None   Inattention:   Forgetful   Hyperactivity/Impulsivity:   Feeling of restlessness; Fidgets with hands/feet   Oppositional/Defiant Behaviors:   Angry   Emotional Irregularity:   Intense/inappropriate anger; Potentially harmful impulsivity; Recurrent suicidal behaviors/gestures/threats   Other Mood/Personality Symptoms:   None noted    Mental Status Exam Appearance and self-care  Stature:   Average   Weight:   Average weight   Clothing:   Neat/clean   Grooming:   Normal   Cosmetic use:   None   Posture/gait:   Normal   Motor activity:   Not Remarkable   Sensorium  Attention:   Normal   Concentration:   Normal   Orientation:   X5   Recall/memory:   Normal   Affect and Mood  Affect:   Flat   Mood:   Depressed; Anxious  Relating  Eye contact:   Normal   Facial expression:   Depressed   Attitude toward examiner:   Cooperative   Thought and Language  Speech flow:  Normal   Thought content:   Appropriate to Mood and Circumstances   Preoccupation:   Ruminations   Hallucinations:   Auditory; Visual   Organization:  No data recorded  [employer] of Knowledge:   Fair   Intelligence:   Average   Abstraction:   Concrete   Judgement:   Impaired   Reality Testing:   Distorted   Insight:   Lacking   Decision Making:   Impulsive   Social Functioning  Social Maturity:   Irresponsible; Impulsive   Social Judgement:   Heedless   Stress  Stressors:   Housing; Teacher, music Ability:    Overwhelmed; Deficient supports   Skill Deficits:   Communication; Decision making; Responsibility; Self-care   Supports:   Support needed     Religion: Religion/Spirituality Are You A Religious Person?: Yes How Might This Affect Treatment?: Not assessed  Leisure/Recreation: Leisure / Recreation Do You Have Hobbies?: No  Exercise/Diet: Exercise/Diet Do You Exercise?: No Have You Gained or Lost A Significant Amount of Weight in the Past Six Months?: No Do You Follow a Special Diet?: No Do You Have Any Trouble Sleeping?: No   CCA Employment/Education Employment/Work Situation: Employment / Work Situation Employment Situation: Unemployed Patient's Job has Been Impacted by Current Illness: No Has Patient ever Been in Passenger transport manager?: No  Education: Education Last Grade Completed: 12 Did [place]?: No Did You Have An Individualized Education Program (IIEP): No Did You Have Any Difficulty At School?: No   CCA Family/Childhood History Family and Relationship History: Family history Marital status: Single Does patient have children?: No  Childhood History:  Childhood History By whom was/is the patient raised?: Both parents Did patient suffer any verbal/emotional/physical/sexual abuse as a child?: No Did patient suffer from severe childhood neglect?: No Has patient ever been sexually abused/assaulted/raped as an adolescent or adult?: No Witnessed domestic violence?: No Has patient been affected by domestic violence as an adult?: No  Child/Adolescent Assessment:     CCA Substance Use Alcohol/Drug Use:                           ASAM's:  Six Dimensions of Multidimensional Assessment  Dimension 1:  Acute Intoxication and/or Withdrawal Potential:      Dimension 2:  Biomedical Conditions and Complications:      Dimension 3:  Emotional, Behavioral, or Cognitive Conditions and Complications:     Dimension 4:  Readiness to Change:      Dimension 5:  Relapse, Continued use, or Continued Problem Potential:     Dimension 6:  Recovery/Living Environment:     ASAM Severity Score:    ASAM Recommended Level of Treatment:     Substance use Disorder (SUD)    Recommendations for Services/Supports/Treatments:    Discharge Disposition:    DSM5 Diagnoses: Patient Active Problem List   Diagnosis Date Noted   Substance-induced disorder ([place]) 04/27/2021   Malingering 06/02/2020   Homicidal ideation 06/02/2020   Paranoia ([place]) 06/02/2020   Suicidal behavior 06/02/2020   Schizophrenia ([place]) 06/02/2020   Schizoaffective disorder ([place]) 05/08/2020   Schizophrenia, paranoid ([place])    Cocaine dependence with cocaine-induced psychotic disorder with complication [facility])    Social anxiety disorder 09/25/2016   Polysubstance abuse ([place])  Dysmenorrhea 12/30/2013     Referrals to Alternative Service(s): Referred to Alternative Service(s):   Place:   Date:   Time:    Referred to Alternative Service(s):   Place:   Date:   Time:    Referred to Alternative Service(s):   Place:   Date:   Time:    Referred to Alternative Service(s):   Place:   Date:   Time:     Venora Maples, [facility]

## 2021-08-26 DIAGNOSIS — R45851 Suicidal ideations: Secondary | ICD-10-CM

## 2021-08-26 LAB — RAPID URINE DRUG SCREEN, HOSP PERFORMED
Amphetamines: NOT DETECTED
Barbiturates: NOT DETECTED
Benzodiazepines: NOT DETECTED
Cocaine: NOT DETECTED
Opiates: NOT DETECTED
Tetrahydrocannabinol: NOT DETECTED

## 2021-08-26 LAB — PREGNANCY, URINE: Preg Test, Ur: NEGATIVE

## 2021-08-26 NOTE — ED Notes (Signed)
Belongings given to pt

## 2021-08-26 NOTE — ED Provider Notes (Signed)
Patient cleared by behavioral health for discharge.  Follow-up as per behavioral health.   Fredia Sorrow, MD 08/26/21 1336

## 2021-08-26 NOTE — Consult Note (Addendum)
Telepsych Consultation   Reason for Consult:  psych consult Referring Physician:  Silverio Decamp, PA-C Location of Patient: [facility] [phone] Location of Provider: [place] Department  Patient Identification: MONEY MCKEITHAN MRN:  [id number] Principal Diagnosis: Suicidal ideations Diagnosis:  Principal Problem:   Suicidal ideations   Total Time spent with patient: 15 minutes  Subjective:   RUCHEL BRANDENBURGER is a 21 y.o. female patient admitted with suicidal ideations..  Patient presents awake and oriented; "I've been sort of suicidal. I feel like I'm being mentally tortured. I feel some people are out to get me. I've explained it 15000 I don't feel like explaining it anymore. But I feel okay now".   Patient denies any active suicidal or homicidal ideations, auditory or visual hallucinations. Says she has been going to [facility] and taking medications. Currently staying at a shelter in [place] and interested in case management services via [facility] in which she was encouraged to pursue.   HPI:  Janiyha Montufar is a 21 year old female with psychiatric history of polysubstance abuse, schizoaffective disorder, malingering, suicidal behavior who presented to [place] 08/25/21 voluntarily for suicidal ideations with plan to shoot herself; denies any access to weapons. Endorsed not following up with psych or taking psychotropic medications. UDS-, BAL outstanding.   Past Psychiatric History: psychiatric history of polysubstance abuse, schizoaffective disorder, malingering, suicidal behavior  Risk to Self:  pt denies Risk to Others:  pt denies Prior Inpatient Therapy:  yes Prior Outpatient Therapy:  yes  Past Medical History:  Past Medical History:  Diagnosis Date   Burning with urination 05/03/2015   Contraceptive management 05/03/2015   Depression    Dysmenorrhea 12/30/2013   Heroin addiction ([facility])    Menorrhagia 12/30/2013   Menstrual extraction 12/30/2013   Migraines    Schizophrenia ([facility])     Social anxiety disorder 09/25/2016   Suicidal ideations    Vaginal odor 05/03/2015    Past Surgical History:  Procedure Laterality Date   NO PAST SURGERIES     Family History:  Family History  Problem Relation Age of Onset   Depression Mother    Hypertension Father    Hyperlipidemia Father    Cancer Paternal Grandmother        breast, uterine   Cirrhosis Paternal Grandfather        due to alcohol   Family Psychiatric  History: not noted Social History:  Social History   Substance and Sexual Activity  Alcohol Use Yes     Social History   Substance and Sexual Activity  Drug Use Yes   Types: Oxycodone, Cocaine, Methamphetamines    Social History   Socioeconomic History   Marital status: Single    Spouse name: Not on file   Number of children: Not on file   Years of education: Not on file   Highest education level: Not on file  Occupational History   Occupation: Unemployed  Tobacco Use   Smoking status: Every Day    Packs/day: 0.50    Types: Cigarettes   Smokeless tobacco: Never  Vaping Use   Vaping Use: Never used  Substance and Sexual Activity   Alcohol use: Yes   Drug use: Yes    Types: Oxycodone, Cocaine, Methamphetamines   Sexual activity: Not on file  Other Topics Concern   Not on file  Social History Narrative   06/23/2019:  Pt stated that she is homeless, that she is a high school graduate, and that she is unemployed and not  followed by any outpatient provider.      Lives with Dad. Mom has LGD, lives in [place]. 11th grader. Dog.   Social Determinants of Health   Financial Resource Strain: Not on file  Food Insecurity: Not on file  Transportation Needs: Not on file  Physical Activity: Not on file  Stress: Not on file  Social Connections: Not on file   Additional Social History:   Allergies:   Allergies  Allergen Reactions   Abilify [Aripiprazole] Other (See Comments)    Hallucinations   Tylenol [Acetaminophen] Other (See Comments)     Bumps on skin/bottoms of feet   Zoloft [Sertraline Hcl] Other (See Comments)    Hallucinations    Labs:  Results for orders placed or performed during the hospital encounter of 08/25/21 (from the past 48 hour(s))  Comprehensive metabolic panel     Status: None   Collection Time: 08/25/21  3:51 PM  Result Value Ref Range   Sodium 138 135 - 145 mmol/L   Potassium 3.7 3.5 - 5.1 mmol/L   Chloride 103 98 - 111 mmol/L   CO2 25 22 - 32 mmol/L   Glucose, Bld 94 70 - 99 mg/dL    Comment: Glucose reference range applies only to samples taken after fasting for at least 8 hours.   BUN 8 6 - 20 mg/dL   Creatinine, Ser 0.75 0.44 - 1.00 mg/dL   Calcium 9.1 8.9 - 10.3 mg/dL   Total Protein 7.6 6.5 - 8.1 g/dL   Albumin 4.3 3.5 - 5.0 g/dL   AST 19 15 - 41 U/L   ALT 15 0 - 44 U/L   Alkaline Phosphatase 75 38 - 126 U/L   Total Bilirubin 0.4 0.3 - 1.2 mg/dL   GFR, Estimated >60 >60 mL/min    Comment: (NOTE) Calculated using the CKD-EPI Creatinine Equation (2021)    Anion gap 10 5 - 15    Comment: Performed at [facility], [address] [place], [place] [address]  Ethanol     Status: None   Collection Time: 08/25/21  3:51 PM  Result Value Ref Range   Alcohol, Ethyl (B) <10 <10 mg/dL    Comment: (NOTE) Lowest detectable limit for serum alcohol is 10 mg/dL.  For medical purposes only. Performed at [facility], [address], [place], [place] [address]   Salicylate level     Status: Abnormal   Collection Time: 08/25/21  3:51 PM  Result Value Ref Range   Salicylate Lvl <5.4 (L) 7.0 - 30.0 mg/dL    Comment: Performed at [facility], [address], [place], [place] [address]  Acetaminophen level     Status: Abnormal   Collection Time: 08/25/21  3:51 PM  Result Value Ref Range   Acetaminophen (Tylenol), Serum <10 (L) 10 - 30 ug/mL    Comment: (NOTE) Therapeutic concentrations vary significantly. A range of 10-30 ug/mL  may be an effective concentration for many patients. However, some   are best treated at concentrations outside of this range. Acetaminophen concentrations >150 ug/mL at 4 hours after ingestion  and >50 ug/mL at 12 hours after ingestion are often associated with  toxic reactions.  Performed at [facility], [address], [place], [place] [address]   cbc     Status: None   Collection Time: 08/25/21  3:51 PM  Result Value Ref Range   WBC 8.5 4.0 - 10.5 K/uL   RBC 4.86 3.87 - 5.11 MIL/uL   Hemoglobin 13.2 12.0 - 15.0  g/dL   HCT 43.5 36.0 - 46.0 %   MCV 89.5 80.0 - 100.0 fL   MCH 27.2 26.0 - 34.0 pg   MCHC 30.3 30.0 - 36.0 g/dL   RDW 13.8 11.5 - 15.5 %   Platelets 263 150 - 400 K/uL   nRBC 0.0 0.0 - 0.2 %    Comment: Performed at [facility], [address], [place], [place] [address]  Rapid urine drug screen (hospital performed)     Status: None   Collection Time: 08/26/21 10:38 AM  Result Value Ref Range   Opiates NONE DETECTED NONE DETECTED   Cocaine NONE DETECTED NONE DETECTED   Benzodiazepines NONE DETECTED NONE DETECTED   Amphetamines NONE DETECTED NONE DETECTED   Tetrahydrocannabinol NONE DETECTED NONE DETECTED   Barbiturates NONE DETECTED NONE DETECTED    Comment: (NOTE) DRUG SCREEN FOR MEDICAL PURPOSES ONLY.  IF CONFIRMATION IS NEEDED FOR ANY PURPOSE, NOTIFY LAB WITHIN 5 DAYS.  LOWEST DETECTABLE LIMITS FOR URINE DRUG SCREEN Drug Class                     Cutoff (ng/mL) Amphetamine and metabolites    1000 Barbiturate and metabolites    200 Benzodiazepine                 270 Tricyclics and metabolites     300 Opiates and metabolites        300 Cocaine and metabolites        300 THC                            50 Performed at [facility], [address], [place], [place] [address]   Pregnancy, urine     Status: None   Collection Time: 08/26/21 10:38 AM  Result Value Ref Range   Preg Test, Ur NEGATIVE NEGATIVE    Comment:        THE SENSITIVITY OF THIS METHODOLOGY IS >20 mIU/mL. Performed at [facility], [address]  [place], [place] [address]     Medications:  Current Facility-Administered Medications  Medication Dose Route Frequency Provider Last Rate Last Admin   paliperidone (INVEGA) 24 hr tablet 6 mg  6 mg Oral Daily Sponseller, Rebekah R, PA-C   6 mg at 08/26/21 1033   Current Outpatient Medications  Medication Sig Dispense Refill   FLUoxetine (PROZAC) 40 MG capsule Take 40 mg by mouth daily.     divalproex (DEPAKOTE ER) 250 MG 24 hr tablet Take 900 mg by mouth 2 (two) times daily. (Patient not taking: Reported on 05/25/2021)     INVEGA SUSTENNA 234 MG/1.5ML SUSY injection Inject 156 mg into the muscle every 28 (twenty-eight) days.     Musculoskeletal: Strength & Muscle Tone: within normal limits Gait & Station: normal Patient leans: N/A  Psychiatric Specialty Exam:  Presentation  General Appearance: Casual  Eye Contact:Good  Speech:Clear and Coherent  Speech Volume:Normal  Handedness:Right  Mood and Affect  Mood:Euthymic  Affect:Appropriate; Congruent  Thought Process  Thought Processes:Coherent  Descriptions of Associations:Intact  Orientation:Full (Time, Place and Person)  Thought Content:Logical  History of Schizophrenia/Schizoaffective disorder:Yes  Duration of Psychotic Symptoms:Less than six months  Hallucinations:Hallucinations: None  Ideas of Reference:None  Suicidal Thoughts:Suicidal Thoughts: No  Homicidal Thoughts:Homicidal Thoughts: No   Sensorium  Memory:Immediate Good; Recent Fair; Remote Fair  Judgment:Fair  Insight:Fair  Executive Functions  Concentration:Good  Attention Span:Good  [place]  Psychomotor Activity  Psychomotor Activity:Psychomotor Activity: Normal   Assets  Assets:Communication Skills; Desire for Improvement; Physical Health  Sleep  Sleep:Sleep: Poor   Physical Exam: Physical Exam Vitals and nursing note reviewed.  Constitutional:      Appearance: Normal  appearance.  HENT:     Head: Normocephalic.     Nose: Nose normal.     Mouth/Throat:     Mouth: Mucous membranes are moist.  Eyes:     Pupils: Pupils are equal, round, and reactive to light.  Cardiovascular:     Rate and Rhythm: Normal rate.     Pulses: Normal pulses.     Heart sounds: Normal heart sounds.  Pulmonary:     Effort: Pulmonary effort is normal.  Abdominal:     Palpations: Abdomen is soft.  Musculoskeletal:        General: Normal range of motion.     Cervical back: Normal range of motion.  Skin:    General: Skin is warm and dry.  Neurological:     Mental Status: She is alert and oriented to person, place, and time. Mental status is at baseline.  Psychiatric:        Attention and Perception: Attention and perception normal.        Mood and Affect: Mood and affect normal.        Speech: Speech normal.        Behavior: Behavior normal. Behavior is cooperative.        Thought Content: Thought content normal. Thought content does not include homicidal or suicidal ideation. Thought content does not include homicidal or suicidal plan.        Cognition and Memory: Cognition and memory normal.        Judgment: Judgment normal.   Review of Systems  Psychiatric/Behavioral:  Positive for depression.   All other systems reviewed and are negative. Blood pressure 105/69, pulse 82, temperature 98.3 F (36.8 C), temperature source Oral, resp. rate (!) 2, height 5\' 3"  (1.6 m), weight 77.1 kg, last menstrual period 08/20/2021, SpO2 99 %. Body mass index is 30.11 kg/m.  Treatment Plan Summary: Plan discharge with plan to follow up with current outpatient providers [facility]).   Disposition: No evidence of imminent risk to self or others at present.   Patient does not meet criteria for psychiatric inpatient admission. Supportive therapy provided about ongoing stressors. Discussed crisis plan, support from social network, calling 911, coming to the Emergency Department, and calling  Suicide Hotline.  This service was provided via telemedicine using a 2-way, interactive audio and video technology.  Names of all persons participating in this telemedicine service and their role in this encounter. Name: Oneida Alar Role: PMHNP  Name: [place] Role: Attending MD  Name: [name] Role: patient  Name:  Role:     Inda Merlin, NP 08/26/2021 1:29 PM

## 2021-08-26 NOTE — Discharge Instructions (Addendum)
Follow-up as per behavioral health. °

## 2021-08-26 NOTE — ED Notes (Signed)
Dr. Darlyne Russian, PA, recommends overnight observation for safety and stabilization with psych reassessment in the AM.

## 2021-10-08 ENCOUNTER — Encounter (HOSPITAL_COMMUNITY): Payer: Self-pay | Admitting: Emergency Medicine

## 2021-10-08 ENCOUNTER — Emergency Department (HOSPITAL_COMMUNITY)
Admission: EM | Admit: 2021-10-08 | Discharge: 2021-10-09 | Disposition: A | Discharge status: Nursing Home (Includes ICF) | Payer: Medicaid Other | Attending: Emergency Medicine | Admitting: Emergency Medicine

## 2021-10-08 ENCOUNTER — Other Ambulatory Visit: Payer: Self-pay

## 2021-10-08 DIAGNOSIS — Z20822 Contact with and (suspected) exposure to covid-19: Secondary | ICD-10-CM | POA: Insufficient documentation

## 2021-10-08 DIAGNOSIS — F251 Schizoaffective disorder, depressive type: Secondary | ICD-10-CM | POA: Diagnosis not present

## 2021-10-08 DIAGNOSIS — R45851 Suicidal ideations: Secondary | ICD-10-CM | POA: Diagnosis not present

## 2021-10-08 DIAGNOSIS — F1510 Other stimulant abuse, uncomplicated: Secondary | ICD-10-CM | POA: Diagnosis not present

## 2021-10-08 DIAGNOSIS — R4789 Other speech disturbances: Secondary | ICD-10-CM | POA: Insufficient documentation

## 2021-10-08 DIAGNOSIS — R443 Hallucinations, unspecified: Secondary | ICD-10-CM

## 2021-10-08 DIAGNOSIS — Y900 Blood alcohol level of less than 20 mg/100 ml: Secondary | ICD-10-CM | POA: Insufficient documentation

## 2021-10-08 LAB — CBC
HCT: 45.4 % (ref 36.0–46.0)
Hemoglobin: 14.2 g/dL (ref 12.0–15.0)
MCH: 27.8 pg (ref 26.0–34.0)
MCHC: 31.3 g/dL (ref 30.0–36.0)
MCV: 88.8 fL (ref 80.0–100.0)
Platelets: 294 10*3/uL (ref 150–400)
RBC: 5.11 MIL/uL (ref 3.87–5.11)
RDW: 13.7 % (ref 11.5–15.5)
WBC: 8.2 10*3/uL (ref 4.0–10.5)
nRBC: 0 % (ref 0.0–0.2)

## 2021-10-08 LAB — COMPREHENSIVE METABOLIC PANEL
ALT: 18 U/L (ref 0–44)
AST: 24 U/L (ref 15–41)
Albumin: 4.6 g/dL (ref 3.5–5.0)
Alkaline Phosphatase: 89 U/L (ref 38–126)
Anion gap: 11 (ref 5–15)
BUN: 10 mg/dL (ref 6–20)
CO2: 22 mmol/L (ref 22–32)
Calcium: 9.4 mg/dL (ref 8.9–10.3)
Chloride: 105 mmol/L (ref 98–111)
Creatinine, Ser: 0.94 mg/dL (ref 0.44–1.00)
GFR, Estimated: >60 mL/min (ref >60)
Glucose, Bld: 89 mg/dL (ref 70–99)
Potassium: 3.6 mmol/L (ref 3.5–5.1)
Sodium: 138 mmol/L (ref 135–145)
Total Bilirubin: 0.7 mg/dL (ref 0.3–1.2)
Total Protein: 8.5 g/dL — ABNORMAL HIGH (ref 6.5–8.1)

## 2021-10-08 LAB — ACETAMINOPHEN LEVEL: Acetaminophen (Tylenol), Serum: <10 ug/mL — ABNORMAL LOW (ref 10–30)

## 2021-10-08 LAB — ETHANOL: Alcohol, Ethyl (B): <10 mg/dL (ref <10)

## 2021-10-08 LAB — RESP PANEL BY RT-PCR (FLU A&B, COVID) ARPGX2
Influenza A by PCR: NEGATIVE
Influenza B by PCR: NEGATIVE
SARS Coronavirus 2 by RT PCR: NEGATIVE

## 2021-10-08 LAB — SALICYLATE LEVEL: Salicylate Lvl: <7 mg/dL — ABNORMAL LOW (ref 7.0–30.0)

## 2021-10-08 MED ORDER — LORAZEPAM 1 MG PO TABS
1.0000 mg | ORAL_TABLET | Freq: Once | ORAL | Status: AC
  Administered 2021-10-08: 1 mg via ORAL
  Filled 2021-10-08: qty 1

## 2021-10-08 NOTE — ED Triage Notes (Addendum)
Pt reports she was brought in [facility] after she called them for auditory and visual hallucinations of human trafficking; pt reports other people are able to hear what she is thinking and reports she has been very paranoid intermittently x 2 years; pt repeats herself and uses nonsense words during triage; when asked about SI pt states "a little bit" ?

## 2021-10-08 NOTE — ED Provider Notes (Signed)
?[facility] ?Provider Note ? ? ?CSN: [id number] ?Arrival date & time: 10/08/21  1546 ? ?  ? ?History ? ?Chief Complaint  ?Patient presents with  ? V70.1  ? ? ?Julia [name] is a 21 y.o. female. ? ?HPI ? ?21 year old female with a history of polysubstance abuse, social anxiety disorder, cocaine dependence, malingering, schizophrenia, paranoia, who presents to the emergency department today for psychiatric evaluation.  Patient states "I think an anomaly broke out ".  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  She also states she has been hearing voices from dead people and God and the devil have also been speaking to her.  She also reports intermittent suicidal ideations.  Denies homicidal ideations.  She denies any medical complaints at this time. She denies a h/o sexual assault ? ?Home Medications ?Prior to Admission medications   ?Medication Sig Start Date End Date Taking? Authorizing Provider  ?divalproex (DEPAKOTE ER) 250 MG 24 hr tablet Take 900 mg by mouth 2 (two) times daily. ?Patient not taking: Reported on 05/25/2021    [provider]  ?FLUoxetine (PROZAC) 40 MG capsule Take 40 mg by mouth daily.    [provider]  ?INVEGA SUSTENNA 234 MG/1.5ML SUSY injection Inject 156 mg into the muscle every 28 (twenty-eight) days. 06/27/21   [provider]  ?   ? ?Allergies    ?Abilify [aripiprazole], Tylenol [acetaminophen], and Zoloft [sertraline hcl]   ? ?Review of Systems   ?Review of Systems ?See HPI for pertinent positives or negatives. ? ? ?Physical Exam ?Updated Vital Signs ?BP 101/71 (BP Location: Right Arm)   Pulse (!) 101   Temp 98.7 ?F (37.1 ?C) (Oral)   Resp 12   Ht '5\' 3"'$  (1.6 m)   Wt 81.6 kg   LMP 10/01/2021 (Approximate)   SpO2 96%   BMI 31.89 kg/m?  ?Physical Exam ?Vitals and nursing note reviewed.  ?Constitutional:   ?   General: She  is not in acute distress. ?   Appearance: She is well-developed.  ?HENT:  ?   Head: Normocephalic and atraumatic.  ?Eyes:  ?   Conjunctiva/sclera: Conjunctivae normal.  ?Pulmonary:  ?   Effort: Pulmonary effort is normal.  ?Musculoskeletal:     ?   General: Normal range of motion.  ?   Cervical back: Neck supple.  ?Skin: ?   General: Skin is warm and dry.  ?Neurological:  ?   Mental Status: She is alert.  ?Psychiatric:     ?   Attention and Perception: She perceives auditory hallucinations.     ?   Mood and Affect: Affect is flat.     ?   Speech: Speech is tangential.     ?   Behavior: Behavior is cooperative.     ?   Thought Content: Thought content is paranoid and delusional. Thought content does not include homicidal or suicidal ideation. Thought content does not include homicidal or suicidal plan.     ?   Cognition and Memory: Cognition is impaired.  ? ? ?ED Results / Procedures / Treatments   ?Labs ?(all labs ordered are listed, but only abnormal results are displayed) ?Labs Reviewed  ?COMPREHENSIVE METABOLIC PANEL - Abnormal; Notable for the following components:  ?    Result Value  ? Total Protein 8.5 (*)   ? All other components within normal limits  ?SALICYLATE  LEVEL - Abnormal; Notable for the following components:  ? Salicylate Lvl <5.3 (*)   ? All other components within normal limits  ?ACETAMINOPHEN LEVEL - Abnormal; Notable for the following components:  ? Acetaminophen (Tylenol), Serum <10 (*)   ? All other components within normal limits  ?RESP PANEL BY RT-PCR (FLU A&B, COVID) ARPGX2  ?ETHANOL  ?CBC  ?RAPID URINE DRUG SCREEN, HOSP PERFORMED  ?PREGNANCY, URINE  ? ? ?EKG ?EKG Interpretation ? ?Date/Time:  Monday October 08 2021 20:38:29 EDT ?Ventricular Rate:  103 ?PR Interval:  170 ?QRS Duration: 84 ?QT Interval:  348 ?QTC Calculation: 455 ?R Axis:   55 ?Text Interpretation: Sinus tachycardia Otherwise normal ECG When compared with ECG of 08-Oct-2021 17:04, No significant change was found Confirmed by  Aletta Edouard [phone]) on 10/08/2021 8:41:56 PM ? ?Radiology ?No results found. ? ?Procedures ?Procedures  ? ? ?Medications Ordered in ED ?Medications  ?LORazepam (ATIVAN) tablet 1 mg (1 mg Oral Given 10/08/21 1950)  ? ? ?ED Course/ Medical Decision Making/ A&P ?  ?                        ?Medical Decision Making ?Amount and/or Complexity of Data Reviewed ?Labs: ordered. ? ?Risk ?Prescription drug management. ? ? ?21 year old female presents emergency department today for psychiatric evaluation.  Patient is admitting to auditory and visual hallucinations.  She is tangential on my assessment and seems paranoid and delusional.  She endorses intermittent SI but none currently.  Denies homicidality.  She denies any medical complaints at this time.  Her laboratory work was reviewed/interpreted CBC, CMP, acetaminophen, Tylenol, EtOH are all reassuring.  COVID/flu is negative.  She appears to be reasonably medically screened at this time and does not require any further medical work-up or admission.  She is appropriate for TTS evaluation at this time. ? ?The patient has been placed in psychiatric observation due to the need to provide a safe environment for the patient while obtaining psychiatric consultation and evaluation, as well as ongoing medical and medication management to treat the patient's condition.  The patient has not been placed under full IVC at this time. ? ?Final Clinical Impression(s) / ED Diagnoses ?Final diagnoses:  ?Hallucinations  ? ? ?Rx / DC Orders ?ED Discharge Orders   ? ? None  ? ?  ? ? ?  ?Rodney Booze, PA-C ?10/08/21 2141 ? ?  ?Hayden Rasmussen, MD ?10/09/21 1036 ? ?

## 2021-10-08 NOTE — ED Notes (Signed)
Pt wanded by security at this time  ?

## 2021-10-09 ENCOUNTER — Inpatient Hospital Stay (HOSPITAL_COMMUNITY): Admission: AD | Admit: 2021-10-09 | Payer: Medicaid Other | Source: Intra-hospital | Admitting: Emergency Medicine

## 2021-10-09 LAB — PREGNANCY, URINE: Preg Test, Ur: NEGATIVE

## 2021-10-09 LAB — RAPID URINE DRUG SCREEN, HOSP PERFORMED
Amphetamines: POSITIVE — AB
Barbiturates: NOT DETECTED
Benzodiazepines: NOT DETECTED
Cocaine: NOT DETECTED
Opiates: NOT DETECTED
Tetrahydrocannabinol: NOT DETECTED

## 2021-10-09 NOTE — Progress Notes (Signed)
Pt was accepted [facility] today 10/09/2021; Bed Assignment [place] [facility] [address] [place] ? ?Pt meets inpatient criteria per Ricky Ala, NP ? ?Attending Physician will be Dr. Kathlene Cote  ? ?Report can be called to: - [phone]  ? ?Pt can arrive after: Bed ready now ? ?Care Team notified: Princess Perna, EMT-P ? ?Nadara Mode, LCSWA ?10/09/2021 @ 5:32 PM ? ?

## 2021-10-09 NOTE — ED Notes (Signed)
TTS attempted but pt states that she is too tired to do it at this time. Latisha from [facility] advised she will call back at shift change.  ?

## 2021-10-09 NOTE — ED Notes (Signed)
Patient was excepted to  ?

## 2021-10-09 NOTE — Progress Notes (Signed)
Patient has been faxed out at the request of Ricky Ala, NP. Patient meets [place] inpatient criteria per Ricky Ala, NP. Patient has been faxed out to the following facilities:  ? ?[facility]  [address] [place] [address] [phone]  ?[facility]  [address], [place] [address] [phone]  ?[facility]  [address] [place] [address] [phone]  ?[place] [place] [address] [phone]  ?[facility] [address], [place] [address] [phone]  ?[facility]  [place], [place] [address] [phone]  ?[facility]  [place], [place] [address] [phone]  ?[facility]  [name] [place] [address] [phone]  ?[facility] [place] [place] [address] [phone]  ?[facility]  [address] [place] [address] [phone]  ?[facility]  [address], [place] [address] [phone]  ?[facility]  [address] [place] [address] [phone]  ? ?Mariea Clonts, MSW, LCSW-A  ?3:20 PM 10/09/2021   ?

## 2021-10-09 NOTE — BH Assessment (Signed)
TTS clinician attempted assessment. Per [place], RN, patient refused due to being sleepy, not alert or oriented. TTS will attempt at later time.  ?

## 2021-10-09 NOTE — ED Notes (Signed)
Safe transport here for pt. Belongings and paperwork given to security and security escorting pt to transport. NAD. VS updated.  ?

## 2021-10-09 NOTE — ED Notes (Signed)
[facility] called 

## 2021-10-09 NOTE — ED Notes (Signed)
TTS in process 

## 2021-10-09 NOTE — BH Assessment (Signed)
Comprehensive Clinical Assessment (CCA) Note ? ?10/09/2021 ?Julia Julia [name] ?[id number] ? ?Disposition: Per Julia Ala, NP, patient is recommended for inpatient treatment.  ? ?[facility] from 10/08/2021 in [facility] from 08/25/2021 in [facility] from 05/25/2021 in [place] DEPT  ?C-SSRS RISK CATEGORY Moderate Risk High Risk Low Risk  ? ?  ? ?The patient demonstrates the following risk factors for suicide: Chronic risk factors for suicide include: psychiatric disorder of Schizoaffective disorder and substance use disorder. Acute risk factors for suicide include: unemployment and homeless . Protective factors for this patient include:  none given . Considering these factors, the overall suicide risk at this point appears to be moderate. Patient is not appropriate for outpatient follow up. ? ?Julia Julia [name] is a 21 year old female presenting to [facility] with [facility] with chief complaint of AVH and SI. Patient states ?an anomaly broke out and now I can see what other people are thinking?. Patient reports she has been homeless for about two years now and for the last months she has been living in a shelter in [place]. Patient reports a day ago she left the shelter to go to her friend ?Herb? house who she reports live in an animal shelter. Patient reports she started seeing what ?Herb? was thinking and it was ?pervy? and he tried to ?push himself on Julia [name]?. Patient reports she left and called the police to bring her to the ED. Patient reports stress from this ?anomaly? and reports feeling paranoid like she can see a sex trafficking ring. Patient reports the ring starts in [place] and meets in the center of [place]. Patient also reports that she cannot hear people right, and then patient makes statements that TTS could not understand. When asked her to repeat it she does not. Patient goes on to share that on her way to [place] about three weeks ago she witnessed a dead  body and now she needs witness protection because she feels like she witnessed a crime. Patient states, ?it feels like I am witnessing a biblical story? and reports that God and the devil were having an argument. Patient goes on to talk about Edmon Crape but again TTS could not understand what patient was talking about.  ? ?Patient reports diagnosis of schizoaffective disorder and she is followed by a provider at [facility]. Patient reports several inpatient admissions with the last one being about three weeks ago in a hospital in [place] for similar issues. Patient reports compliance with medications of Invega, risperidone, guanfacine, and Seroquel. Patient reports meth use about two days ago and she reports using a couple times a month. Patient also reports alcohol use occasionally. Patient was receiving ACTT services but reports they did nothing to help her. Patient reports she stopped receiving services about three months ago with [facility]. Patient is homeless, does not have income nor on disability, denies legal issues and denies having supportive family in the area. ? ?Patient is alert, engaged and cooperative. Patient oriented to time and place. Patient presents somewhat paranoid, her thoughts are a little disorganized, her eye contact is normal, and speech is difficult to understand at times. Patient reports that she is ?a little? suicidal without a plan but can not contract for safety. Patient reports AVH of hearing people thoughts and seeing dead bodies. Patient denies HI, SIB. Patient does not appear to be responding to internal/external stimuli during assessment. Patient reports needing inpatient treatment.  ? ? ?Chief Complaint:  ?Chief  Complaint  ?Patient presents with  ? V70.1  ? ?Visit Diagnosis: Schizoaffective disorder, depressed type ?     Suicidal Ideations  ? ? ?CCA Screening, Triage and Referral (STR) ? ?Patient Reported Information ?How did you hear about [place]? Legal System ? ?What Is the Reason for  Your Visit/Call Today? Pt reports she was brought in [facility] after she called them for auditory and visual hallucinations of human trafficking; pt reports other people are able to hear what she is thinking and reports she has been very paranoid intermittently x 2 years; pt repeats herself and uses nonsense words during triage; when asked about SI pt states "a little bit" ? ?How Long Has This Been Causing You Problems? 1 wk - 1 month ? ?What Do You Feel Would Help You the Most Today? Treatment for Depression or other mood problem ? ? ?Have You Recently Had Any Thoughts About Hurting Yourself? Yes ? ?Are You Planning to Commit Suicide/Harm Yourself At This time? No ? ? ?Have you Recently Had Thoughts About [place]? No ? ?Are You Planning to Harm Someone at This Time? No ? ?Explanation: uta ? ? ?Have You Used Any Alcohol or Drugs in the Past 24 Hours? No ? ?How Long Ago Did You Use Drugs or Alcohol? No data recorded ?What Did You Use and How Much? Patient reports use of cocaine. Age of first use is 21 y/o. She uses cocaine every day. Average amount of use is cocaine. Duration of use is 3 months. Last use was 2 days ago; 1 gram.   Patient reports use of alcohol. She started drinking at the age of 21 yrs old. She drinks alcohol ?every other day?[name] Average amt of use is ?2-3 bootleggers?. Last use was last night, #1 Smirnoff. ? ? ?Do You Currently Have a Therapist/Psychiatrist? Yes ? ?Name of Therapist/Psychiatrist: [facility] ? ? ?Have You Been Recently Discharged From Any Office Practice or Programs? Yes ? ?Explanation of Discharge From Practice/Program: 3 weeks ago discahrged from psych hospital in [place] ? ? ?  ?CCA Screening Triage Referral Assessment ?Type of Contact: Tele-Assessment ? ?Telemedicine Service Delivery: Telemedicine service delivery: This service was provided via telemedicine using a 2-way, interactive audio and video technology ? ?Is this Initial or Reassessment? Initial Assessment ? ?Date Telepsych  consult ordered in [facility]:  10/08/21 ? ?Time Telepsych consult ordered in [facility]:  1658 ? ?Location of Assessment: [facility] ? ?Provider Location: [facility] ? ? ?Collateral Involvement: none ? ? ?Does Patient Have a Stage manager Guardian? No data recorded ?Name and Contact of Legal Guardian: No data recorded ?If Minor and Not Living with Parent(s), Who has Custody? N/A ? ?Is CPS involved or ever been involved? Never ? ?Is APS involved or ever been involved? Never ? ? ?Patient Determined To Be At Risk for Harm To Self or Others Based on Review of Patient Reported Information or Presenting Complaint? Yes, for Self-Harm ? ?Method: No data recorded ?Availability of Means: No data recorded ?Intent: No data recorded ?Notification Required: No data recorded ?Additional Information for Danger to Others Potential: No data recorded ?Additional Comments for Danger to Others Potential: No data recorded ?Are There Guns or Other Weapons in [place]? No data recorded ?Types of Guns/Weapons: No data recorded ?Are These Weapons Safely Secured?                            No data recorded ?Who Could  Verify You Are Able To Have These Secured: No data recorded ?Do You Have any Outstanding Charges, Pending Court Dates, Parole/Probation? No data recorded ?Contacted To Inform of Risk of Harm To Self or Others: Unable to Contact: ? ? ? ?Does Patient Present under Involuntary Commitment? No ? ?IVC Papers Initial File Date: 03/07/21 ? ? ?[place] of Residence: [place] ? ? ?Patient Currently Receiving the Following Services: Medication Management ? ? ?Determination of Need: Urgent (48 hours) ? ? ?Options For Referral: Inpatient Hospitalization; Outpatient Therapy; Medication Management; Other: Comment (ACTT) ? ? ? ? ?CCA Biopsychosocial ?Patient Reported Schizophrenia/Schizoaffective Diagnosis in Past: Yes ? ? ?Strengths: Pt expressed wanting help. ? ? ?Mental Health Symptoms ?Depression:   ?Change in energy/activity;  Fatigue; Difficulty Concentrating; Worthlessness; Hopelessness; Irritability ?  ?Duration of Depressive symptoms:    ?Mania:   ?Recklessness; Change in energy/activity; Racing thoughts ?  ?Anxiety:    ?Difficu

## 2022-09-11 ENCOUNTER — Emergency Department (HOSPITAL_COMMUNITY): Payer: BC Managed Care – PPO

## 2022-09-11 ENCOUNTER — Encounter (HOSPITAL_COMMUNITY): Payer: Self-pay | Admitting: *Deleted

## 2022-09-11 ENCOUNTER — Other Ambulatory Visit: Payer: Self-pay

## 2022-09-11 ENCOUNTER — Emergency Department (HOSPITAL_COMMUNITY)
Admission: EM | Admit: 2022-09-11 | Discharge: 2022-09-11 | Disposition: A | Discharge status: Home / Self Care | Payer: BC Managed Care – PPO | Attending: Emergency Medicine | Admitting: Emergency Medicine

## 2022-09-11 DIAGNOSIS — K5900 Constipation, unspecified: Secondary | ICD-10-CM

## 2022-09-11 DIAGNOSIS — R1084 Generalized abdominal pain: Secondary | ICD-10-CM

## 2022-09-11 LAB — CBC WITH DIFFERENTIAL/PLATELET
Abs Immature Granulocytes: 0.04 10*3/uL (ref 0.00–0.07)
Basophils Absolute: 0 10*3/uL (ref 0.0–0.1)
Basophils Relative: 0 %
Eosinophils Absolute: 0.1 10*3/uL (ref 0.0–0.5)
Eosinophils Relative: 1 %
HCT: 43.4 % (ref 36.0–46.0)
Hemoglobin: 13.5 g/dL (ref 12.0–15.0)
Immature Granulocytes: 1 %
Lymphocytes Relative: 35 %
Lymphs Abs: 2.7 10*3/uL (ref 0.7–4.0)
MCH: 27.4 pg (ref 26.0–34.0)
MCHC: 31.1 g/dL (ref 30.0–36.0)
MCV: 88 fL (ref 80.0–100.0)
Monocytes Absolute: 0.6 10*3/uL (ref 0.1–1.0)
Monocytes Relative: 8 %
Neutro Abs: 4.2 10*3/uL (ref 1.7–7.7)
Neutrophils Relative %: 55 %
Platelets: 343 10*3/uL (ref 150–400)
RBC: 4.93 MIL/uL (ref 3.87–5.11)
RDW: 13.5 % (ref 11.5–15.5)
WBC: 7.6 10*3/uL (ref 4.0–10.5)
nRBC: 0 % (ref 0.0–0.2)

## 2022-09-11 LAB — COMPREHENSIVE METABOLIC PANEL
ALT: 37 U/L (ref 0–44)
AST: 24 U/L (ref 15–41)
Albumin: 4.3 g/dL (ref 3.5–5.0)
Alkaline Phosphatase: 91 U/L (ref 38–126)
Anion gap: 7 (ref 5–15)
BUN: 10 mg/dL (ref 6–20)
CO2: 25 mmol/L (ref 22–32)
Calcium: 9.2 mg/dL (ref 8.9–10.3)
Chloride: 103 mmol/L (ref 98–111)
Creatinine, Ser: 0.83 mg/dL (ref 0.44–1.00)
GFR, Estimated: >60 mL/min (ref >60)
Glucose, Bld: 98 mg/dL (ref 70–99)
Potassium: 3.8 mmol/L (ref 3.5–5.1)
Sodium: 135 mmol/L (ref 135–145)
Total Bilirubin: 0.6 mg/dL (ref 0.3–1.2)
Total Protein: 8 g/dL (ref 6.5–8.1)

## 2022-09-11 LAB — HCG, QUANTITATIVE, PREGNANCY: hCG, Beta Chain, Quant, S: <1 m[IU]/mL (ref <5)

## 2022-09-11 LAB — ETHANOL: Alcohol, Ethyl (B): <10 mg/dL (ref <10)

## 2022-09-11 MED ORDER — POLYETHYLENE GLYCOL 3350 17 G PO PACK: 17.0000 g | PACK | Freq: Every day | ORAL | 0 refills | Status: DC

## 2022-09-11 NOTE — ED Notes (Signed)
Urine sample requested by pt. Pt attempted x1 and states she can not go at this time. Water provided to patient

## 2022-09-11 NOTE — Discharge Instructions (Signed)

## 2022-09-11 NOTE — ED Provider Notes (Signed)
Emergency Department Provider Note   I have reviewed the triage vital signs and the nursing notes.   HISTORY  Chief Complaint Constipation   HPI Julia [name] is a 22 y.o. female with past history of schizophrenia and polysubstance abuse presents emergency department with constipation and feeling like she is having a panic attack.  She states she is no longer taking any of her medications because she feels like they are not helping her.  She is supposed to be following at [facility].  She denies any auditory visualizations. No SI/HI.    Past Medical History:  Diagnosis Date   Burning with urination 05/03/2015   Contraceptive management 05/03/2015   Depression    Dysmenorrhea 12/30/2013   Heroin addiction ([facility])    Menorrhagia 12/30/2013   Menstrual extraction 12/30/2013   Migraines    Schizophrenia ([facility])    Social anxiety disorder 09/25/2016   Suicidal ideations    Vaginal odor 05/03/2015    Review of Systems  Constitutional: No fever/chills Eyes: No visual changes. ENT: No sore throat. Cardiovascular: Denies chest pain. Respiratory: Denies shortness of breath. Gastrointestinal: No abdominal pain.  No nausea, no vomiting.  No diarrhea. Positive constipation. Genitourinary: Negative for dysuria. Musculoskeletal: Negative for back pain. Skin: Negative for rash. Neurological: Negative for headaches, focal weakness or numbness.  ____________________________________________   PHYSICAL EXAM:  VITAL SIGNS: ED Triage Vitals  Enc Vitals Group     BP 09/11/22 1004 95/77     Pulse Rate 09/11/22 1004 (!) 128     Resp 09/11/22 1004 20     Temp 09/11/22 1004 98.7 F (37.1 C)     Temp Source 09/11/22 1004 Oral     SpO2 09/11/22 1004 99 %     Weight 09/11/22 1005 170 lb (77.1 kg)     Height 09/11/22 1005 '5\' 4"'$  (1.626 m)   Constitutional: Alert and oriented. Well appearing and in no acute distress. Eyes: Conjunctivae are normal.  Head: Atraumatic. Nose: No  congestion/rhinnorhea. Mouth/Throat: Mucous membranes are moist.  Neck: No stridor.   Cardiovascular: Normal rate, regular rhythm. Good peripheral circulation. Grossly normal heart sounds.   Respiratory: Normal respiratory effort.  No retractions. Lungs CTAB. Gastrointestinal: Soft and nontender. No distention.  Musculoskeletal: No lower extremity tenderness nor edema. No gross deformities of extremities. Neurologic:  Normal speech and language. No gross focal neurologic deficits are appreciated.  Skin:  Skin is warm, dry and intact. No rash noted. Psychiatric: Patient somewhat bizarre but thought process organized.  Denies suicidal or homicidal ideation.  Does not appear to be responding to internal stimulus.   ____________________________________________   LABS (all labs ordered are listed, but only abnormal results are displayed)  Labs Reviewed  COMPREHENSIVE METABOLIC PANEL  CBC WITH DIFFERENTIAL/PLATELET  ETHANOL  HCG, QUANTITATIVE, PREGNANCY   ____________________________________________  RADIOLOGY  DG Abdomen Acute W/Chest  Result Date: 09/11/2022 CLINICAL DATA:  Abdominal pain EXAM: DG ABDOMEN ACUTE WITH 1 VIEW CHEST COMPARISON:  09/05/2020 FINDINGS: The lungs appear clear. Cardiac and mediastinal contours normal. No pleural effusion identified. 12 degrees of levoconvex lumbar scoliosis as measured between T11 and L3, with mild rotary component. No dilated bowel or abnormal air-fluid levels. Unremarkable bowel gas pattern. No significant abnormal calcifications noted. IMPRESSION: 1. Unremarkable bowel gas pattern. 2. 12 degrees of levoconvex lumbar scoliosis. Electronically Signed   By: Van Clines M.D.   On: 09/11/2022 12:22    ____________________________________________   PROCEDURES  Procedure(s) performed:   Procedures  None  ____________________________________________  INITIAL IMPRESSION / ASSESSMENT AND PLAN / ED COURSE  Pertinent labs & imaging  results that were available during my care of the patient were reviewed by me and considered in my medical decision making (see chart for details).   This patient is Presenting for Evaluation of abdominal pain, which does require a range of treatment options, and is a complaint that involves a high risk of morbidity and mortality.  The Differential Diagnoses includes but is not exclusive to acute cholecystitis, intrathoracic causes for epigastric abdominal pain, gastritis, duodenitis, pancreatitis, small bowel or large bowel obstruction, abdominal aortic aneurysm, hernia, gastritis, etc.    Clinical Laboratory Tests Ordered, included CMP unremarkable.  CBC normal.  Pregnancy negative.  Radiologic Tests Ordered, included abdominal x-ray. I independently interpreted the images and agree with radiology interpretation.    Medical Decision Making: Summary:  Patient presents emergency department for evaluation of abdominal pain and constipation.  Abdominal x-ray without obstruction.  Abdominal exam reassuring.  Considered psychiatry evaluation but she has known history of schizophrenia.  She does not appear to be a danger to herself or others at this time.  Do not see indication for IVC or emergency psych consultation.   Reevaluation with update and discussion with patient.  We discussed her x-ray findings and plan for [facility] follow-up.   Patient's presentation is most consistent with acute, uncomplicated illness.   Disposition: discharge  ____________________________________________  FINAL CLINICAL IMPRESSION(S) / ED DIAGNOSES  Final diagnoses:  Generalized abdominal pain  Constipation, unspecified constipation type     NEW OUTPATIENT MEDICATIONS STARTED DURING THIS VISIT:  Discharge Medication List as of 09/11/2022  1:06 PM     START taking these medications   Details  polyethylene glycol (MIRALAX) 17 g packet Take 17 g by mouth daily., Starting Wed 09/11/2022, Normal         Note:  This document was prepared using Dragon voice recognition software and may include unintentional dictation errors.  Nanda Quinton, MD, [facility] Emergency Medicine    Zierra Laroque, Wonda Olds, MD 09/12/22 [phone]

## 2022-09-11 NOTE — ED Triage Notes (Signed)
Pt states she has not had a BM in 6 days; pt states she is also having difficulty with urinating and feels like she is having a panic attack

## 2022-09-11 NOTE — ED Notes (Signed)
Pt being very loud in [facility]1. Went to check on pt and she said "she was just laughing".

## 2022-09-11 NOTE — ED Notes (Signed)
Urine collected by accident. Pt ambulated to BR stating she could collect urine, but then stated, "Oh, I forgot" when she returned from bathroom. Pt still has specimen cup provided to her.

## 2022-09-11 NOTE — ED Provider Triage Note (Signed)
Emergency Medicine Provider Triage Evaluation Note  Julia [name] , a 22 y.o. female  was evaluated in triage.  Pt complains of anxiety attacks along with the belly pain.  She reports a Julia [name] history of similar attacks and feels that she is not on the right medications.  She denies any suicidal or homicidal ideation.  Abdominal pain is diffuse but specifically points out that she feels like "my pancreas is quivering."  Also notes that she generally does not feel well.  We discussed screening blood work and patient tells [name] that "I do not have much blood to give" but is in agreement with treatment plan.  Review of Systems  Positive: Abdominal pain Negative: CP, Fever, Chills  Physical Exam  BP 95/77 (BP Location: Right Arm)   Pulse (!) 128   Temp 98.7 F (37.1 C) (Oral)   Resp 20   Ht '5\' 4"'$  (1.626 m)   Wt 77.1 kg   LMP  (LMP Unknown)   SpO2 99%   BMI 29.18 kg/m  Gen:   Awake, no distress   Resp:  Normal effort  MSK:   Moves extremities without difficulty  Other:  Abd soft and non-tender.   Medical Decision Making  Medically screening exam initiated at 10:22 AM.  Appropriate orders placed.  Julia [name] was informed that the remainder of the evaluation will be completed by another provider, this initial triage assessment does not replace that evaluation, and the importance of remaining in the ED until their evaluation is complete.    [name], MD 09/11/22 1024

## 2022-09-13 ENCOUNTER — Emergency Department (HOSPITAL_COMMUNITY)
Admission: EM | Admit: 2022-09-13 | Discharge: 2022-09-13 | Disposition: A | Discharge status: Home / Self Care | Payer: BC Managed Care – PPO | Attending: Emergency Medicine | Admitting: Emergency Medicine

## 2022-09-13 ENCOUNTER — Other Ambulatory Visit: Payer: Self-pay

## 2022-09-13 DIAGNOSIS — F22 Delusional disorders: Secondary | ICD-10-CM | POA: Diagnosis present

## 2022-09-13 DIAGNOSIS — F419 Anxiety disorder, unspecified: Secondary | ICD-10-CM | POA: Diagnosis not present

## 2022-09-13 LAB — CBC WITH DIFFERENTIAL/PLATELET
Abs Immature Granulocytes: 0.02 10*3/uL (ref 0.00–0.07)
Basophils Absolute: 0 10*3/uL (ref 0.0–0.1)
Basophils Relative: 0 %
Eosinophils Absolute: 0.1 10*3/uL (ref 0.0–0.5)
Eosinophils Relative: 1 %
HCT: 39.9 % (ref 36.0–46.0)
Hemoglobin: 12.5 g/dL (ref 12.0–15.0)
Immature Granulocytes: 0 %
Lymphocytes Relative: 38 %
Lymphs Abs: 2.5 10*3/uL (ref 0.7–4.0)
MCH: 27.8 pg (ref 26.0–34.0)
MCHC: 31.3 g/dL (ref 30.0–36.0)
MCV: 88.7 fL (ref 80.0–100.0)
Monocytes Absolute: 0.5 10*3/uL (ref 0.1–1.0)
Monocytes Relative: 7 %
Neutro Abs: 3.5 10*3/uL (ref 1.7–7.7)
Neutrophils Relative %: 54 %
Platelets: 290 10*3/uL (ref 150–400)
RBC: 4.5 MIL/uL (ref 3.87–5.11)
RDW: 13.6 % (ref 11.5–15.5)
WBC: 6.5 10*3/uL (ref 4.0–10.5)
nRBC: 0 % (ref 0.0–0.2)

## 2022-09-13 LAB — ETHANOL: Alcohol, Ethyl (B): <10 mg/dL (ref <10)

## 2022-09-13 LAB — ACETAMINOPHEN LEVEL: Acetaminophen (Tylenol), Serum: <10 ug/mL — ABNORMAL LOW (ref 10–30)

## 2022-09-13 LAB — COMPREHENSIVE METABOLIC PANEL
ALT: 27 U/L (ref 0–44)
AST: 16 U/L (ref 15–41)
Albumin: 3.7 g/dL (ref 3.5–5.0)
Alkaline Phosphatase: 73 U/L (ref 38–126)
Anion gap: 5 (ref 5–15)
BUN: 10 mg/dL (ref 6–20)
CO2: 27 mmol/L (ref 22–32)
Calcium: 9 mg/dL (ref 8.9–10.3)
Chloride: 105 mmol/L (ref 98–111)
Creatinine, Ser: 0.73 mg/dL (ref 0.44–1.00)
GFR, Estimated: >60 mL/min (ref >60)
Glucose, Bld: 99 mg/dL (ref 70–99)
Potassium: 4.1 mmol/L (ref 3.5–5.1)
Sodium: 137 mmol/L (ref 135–145)
Total Bilirubin: 0.2 mg/dL — ABNORMAL LOW (ref 0.3–1.2)
Total Protein: 7 g/dL (ref 6.5–8.1)

## 2022-09-13 LAB — SALICYLATE LEVEL: Salicylate Lvl: <7 mg/dL — ABNORMAL LOW (ref 7.0–30.0)

## 2022-09-13 NOTE — Discharge Instructions (Signed)
Please contact [facility] to arrange follow-up appointment.  You have also been provided resource list for other outpatient counseling services.  Return to the emergency department for any new or worsening symptoms.

## 2022-09-13 NOTE — ED Provider Notes (Signed)
[facility] Provider Note   CSN: [id number] Arrival date & time: 09/13/22  0907     History {Add pertinent medical, surgical, social history, OB history to HPI:1} Chief Complaint  Patient presents with   Panic Attack    Julia [name] is a 22 y.o. female.  HPI     Julia [name] is a 22 y.o. female with past medical history of polysubstance abuse, social anxiety disorder, schizoaffective disorder who presents to the Emergency Department with complaint of hearing voices and paranoia.  States her symptoms have been present for some time, gradually worsening.  States that she is heard voices that are telling her they are "betting bitcoin on me."  She states that her parents are dead and she does not have anyone that will help her.  She states the voices sometimes also tell her to harm herself although she denies suicidal or homicidal thoughts at this time.  Denies any missed doses of her medications.  She was seen here 2 days ago for generalized abdominal pain. Denies any medical complaints at this time  Home Medications Prior to Admission medications   Medication Sig Start Date End Date Taking? Authorizing Provider  divalproex (DEPAKOTE ER) 250 MG 24 hr tablet Take 900 mg by mouth 2 (two) times daily. Patient not taking: Reported on 05/25/2021    [provider]  FLUoxetine (PROZAC) 20 MG capsule Take 20 mg by mouth daily. 09/17/21   [provider]  guanFACINE (INTUNIV) 2 MG TB24 ER tablet Take 2 mg by mouth daily. 09/17/21   [provider]  hydrOXYzine (ATARAX) 50 MG tablet Take 50 mg by mouth every 6 (six) hours as needed for anxiety. 09/17/21   [provider]  paliperidone (INVEGA SUSTENNA) 156 MG/ML SUSY injection Inject 156 mg into the muscle every 28 (twenty-eight) days. 06/27/21   [provider]  polyethylene glycol (MIRALAX) 17 g packet Take 17 g by mouth daily. 09/11/22   Long, Wonda Olds, MD   QUEtiapine (SEROQUEL) 300 MG tablet Take 300 mg by mouth at bedtime. 09/17/21   [provider]  risperiDONE (RISPERDAL) 2 MG tablet Take 2 mg by mouth 2 (two) times daily. 09/17/21   [provider]      Allergies    Abilify [aripiprazole], Tylenol [acetaminophen], Zoloft [sertraline hcl], and Haloperidol    Review of Systems   Review of Systems  Constitutional:  Negative for appetite change, chills and fever.  Respiratory:  Negative for shortness of breath.   Cardiovascular:  Negative for chest pain.  Gastrointestinal:  Negative for abdominal pain, diarrhea, nausea and vomiting.  Skin:  Negative for color change, rash and wound.  Neurological:  Negative for dizziness, numbness and headaches.  Psychiatric/Behavioral:  Positive for hallucinations. Negative for agitation.     Physical Exam Updated Vital Signs BP 115/74 (BP Location: Right Arm)   Pulse (!) 110   Temp 98 F (36.7 C) (Oral)   Resp 18   Ht '5\' 4"'$  (1.626 m)   Wt 77.1 kg   LMP 08/26/2022 (Approximate)   SpO2 99%   BMI 29.18 kg/m  Physical Exam Vitals and nursing note reviewed.  Constitutional:      General: She is not in acute distress.    Appearance: Normal appearance. She is not toxic-appearing.  HENT:     Head: Atraumatic.     Mouth/Throat:     Mouth: Mucous membranes are moist.     Pharynx: Oropharynx  is clear.  Eyes:     Conjunctiva/sclera: Conjunctivae normal.  Cardiovascular:     Rate and Rhythm: Normal rate and regular rhythm.     Pulses: Normal pulses.  Pulmonary:     Effort: Pulmonary effort is normal. No respiratory distress.  Abdominal:     Palpations: Abdomen is soft.     Tenderness: There is no abdominal tenderness.  Musculoskeletal:        General: Normal range of motion.  Skin:    General: Skin is warm.     Capillary Refill: Capillary refill takes less than 2 seconds.     Findings: No rash.  Neurological:     General: No focal deficit present.     Mental Status: She  is alert.     Sensory: No sensory deficit.     Motor: No weakness.     ED Results / Procedures / Treatments   Labs (all labs ordered are listed, but only abnormal results are displayed) Labs Reviewed  CBC WITH DIFFERENTIAL/PLATELET  ACETAMINOPHEN LEVEL  COMPREHENSIVE METABOLIC PANEL  ETHANOL  SALICYLATE LEVEL  URINALYSIS, ROUTINE W REFLEX MICROSCOPIC  PREGNANCY, URINE    EKG None  Radiology DG Abdomen Acute W/Chest  Result Date: 09/11/2022 CLINICAL DATA:  Abdominal pain EXAM: DG ABDOMEN ACUTE WITH 1 VIEW CHEST COMPARISON:  09/05/2020 FINDINGS: The lungs appear clear. Cardiac and mediastinal contours normal. No pleural effusion identified. 12 degrees of levoconvex lumbar scoliosis as measured between T11 and L3, with mild rotary component. No dilated bowel or abnormal air-fluid levels. Unremarkable bowel gas pattern. No significant abnormal calcifications noted. IMPRESSION: 1. Unremarkable bowel gas pattern. 2. 12 degrees of levoconvex lumbar scoliosis. Electronically Signed   By: Van Clines M.D.   On: 09/11/2022 12:22    Procedures Procedures  {Document cardiac monitor, telemetry assessment procedure when appropriate:1}  Medications Ordered in ED Medications - No data to display  ED Course/ Medical Decision Making/ A&P   {   Click here for ABCD2, HEART and other calculatorsREFRESH Note before signing :1}                          Medical Decision Making Amount and/or Complexity of Data Reviewed Labs: ordered.   ***  {Document critical care time when appropriate:1} {Document review of labs and clinical decision tools ie heart score, Chads2Vasc2 etc:1}  {Document your independent review of radiology images, and any outside records:1} {Document your discussion with family members, caretakers, and with consultants:1} {Document social determinants of health affecting pt's care:1} {Document your decision making why or why not admission, treatments were  needed:1} Final Clinical Impression(s) / ED Diagnoses Final diagnoses:  None    Rx / DC Orders ED Discharge Orders     None

## 2022-09-13 NOTE — ED Triage Notes (Signed)
Pt states "they are betting bitcoin on me. My parents are dead & no one will help me."  States she is paranoid.  States she has been taking her medications as prescribed. Denies SI/HI.

## 2022-11-05 ENCOUNTER — Other Ambulatory Visit: Payer: Self-pay

## 2022-11-05 ENCOUNTER — Emergency Department (HOSPITAL_COMMUNITY)
Admission: EM | Admit: 2022-11-05 | Discharge: 2022-11-05 | Discharge status: Against Medical Advice | Payer: BC Managed Care – PPO | Attending: Emergency Medicine | Admitting: Emergency Medicine

## 2022-11-05 ENCOUNTER — Encounter (HOSPITAL_COMMUNITY): Payer: Self-pay | Admitting: Emergency Medicine

## 2022-11-05 DIAGNOSIS — Z5321 Procedure and treatment not carried out due to patient leaving prior to being seen by health care provider: Secondary | ICD-10-CM | POA: Diagnosis not present

## 2022-11-05 DIAGNOSIS — F22 Delusional disorders: Secondary | ICD-10-CM | POA: Diagnosis present

## 2022-11-05 NOTE — ED Triage Notes (Signed)
Pt states that she needs a sonar because she thinks that she has a kid. When asking pt questions, pt told me to stop asking her personal questions. Pt states that she has been taking a lot of drugs the last few days, and has missed her seroquel. Pt acting very erratic and gets easily upset.

## 2022-11-05 NOTE — ED Notes (Signed)
Pt refused to get into bed stating she was not a mental pt. Pt then proceeded to the the department screaming.

## 2022-12-28 ENCOUNTER — Other Ambulatory Visit: Payer: Self-pay

## 2022-12-28 ENCOUNTER — Encounter (HOSPITAL_COMMUNITY): Payer: Self-pay | Admitting: Emergency Medicine

## 2022-12-28 ENCOUNTER — Emergency Department (HOSPITAL_COMMUNITY)
Admission: EM | Admit: 2022-12-28 | Discharge: 2022-12-29 | Disposition: A | Discharge status: Other Acute Inpatient Hospital | Payer: BC Managed Care – PPO | Source: Home / Self Care | Attending: Emergency Medicine | Admitting: Emergency Medicine

## 2022-12-28 DIAGNOSIS — R443 Hallucinations, unspecified: Secondary | ICD-10-CM

## 2022-12-28 DIAGNOSIS — F251 Schizoaffective disorder, depressive type: Secondary | ICD-10-CM | POA: Insufficient documentation

## 2022-12-28 DIAGNOSIS — R441 Visual hallucinations: Secondary | ICD-10-CM | POA: Insufficient documentation

## 2022-12-28 DIAGNOSIS — R440 Auditory hallucinations: Secondary | ICD-10-CM | POA: Insufficient documentation

## 2022-12-28 LAB — COMPREHENSIVE METABOLIC PANEL
ALT: 16 U/L (ref 0–44)
AST: 17 U/L (ref 15–41)
Albumin: 3.7 g/dL (ref 3.5–5.0)
Alkaline Phosphatase: 66 U/L (ref 38–126)
Anion gap: 6 (ref 5–15)
BUN: 12 mg/dL (ref 6–20)
CO2: 25 mmol/L (ref 22–32)
Calcium: 8.9 mg/dL (ref 8.9–10.3)
Chloride: 107 mmol/L (ref 98–111)
Creatinine, Ser: 0.87 mg/dL (ref 0.44–1.00)
GFR, Estimated: >60 mL/min (ref >60)
Glucose, Bld: 90 mg/dL (ref 70–99)
Potassium: 3.4 mmol/L — ABNORMAL LOW (ref 3.5–5.1)
Sodium: 138 mmol/L (ref 135–145)
Total Bilirubin: 0.3 mg/dL (ref 0.3–1.2)
Total Protein: 6.8 g/dL (ref 6.5–8.1)

## 2022-12-28 LAB — CBC
HCT: 41.1 % (ref 36.0–46.0)
Hemoglobin: 13.3 g/dL (ref 12.0–15.0)
MCH: 28.6 pg (ref 26.0–34.0)
MCHC: 32.4 g/dL (ref 30.0–36.0)
MCV: 88.4 fL (ref 80.0–100.0)
Platelets: 230 10*3/uL (ref 150–400)
RBC: 4.65 MIL/uL (ref 3.87–5.11)
RDW: 14.5 % (ref 11.5–15.5)
WBC: 8.9 10*3/uL (ref 4.0–10.5)
nRBC: 0 % (ref 0.0–0.2)

## 2022-12-28 LAB — SALICYLATE LEVEL: Salicylate Lvl: <7 mg/dL — ABNORMAL LOW (ref 7.0–30.0)

## 2022-12-28 LAB — RAPID URINE DRUG SCREEN, HOSP PERFORMED
Amphetamines: NOT DETECTED
Barbiturates: NOT DETECTED
Benzodiazepines: POSITIVE — AB
Cocaine: NOT DETECTED
Opiates: NOT DETECTED
Tetrahydrocannabinol: NOT DETECTED

## 2022-12-28 LAB — ETHANOL: Alcohol, Ethyl (B): <10 mg/dL (ref <10)

## 2022-12-28 LAB — POC URINE PREG, ED: Preg Test, Ur: NEGATIVE

## 2022-12-28 LAB — ACETAMINOPHEN LEVEL: Acetaminophen (Tylenol), Serum: <10 ug/mL — ABNORMAL LOW (ref 10–30)

## 2022-12-28 MED ORDER — NICOTINE 14 MG/24HR TD PT24
14.0000 mg | MEDICATED_PATCH | Freq: Once | TRANSDERMAL | Status: DC
  Administered 2022-12-29: 14 mg via TRANSDERMAL
  Filled 2022-12-28 (×2): qty 1

## 2022-12-28 MED ORDER — NICOTINE POLACRILEX 2 MG MT GUM: 2.0000 mg | CHEWING_GUM | OROMUCOSAL | Status: DC | PRN

## 2022-12-28 MED ORDER — FLUOXETINE HCL 20 MG PO CAPS
20.0000 mg | ORAL_CAPSULE | Freq: Every day | ORAL | Status: DC
  Administered 2022-12-29: 20 mg via ORAL
  Filled 2022-12-28: qty 1

## 2022-12-28 MED ORDER — RISPERIDONE 1 MG PO TABS
3.0000 mg | ORAL_TABLET | Freq: Every day | ORAL | Status: DC
  Administered 2022-12-28: 3 mg via ORAL
  Filled 2022-12-28: qty 3

## 2022-12-28 MED ORDER — CLONAZEPAM 0.5 MG PO TABS
0.5000 mg | ORAL_TABLET | Freq: Two times a day (BID) | ORAL | Status: DC
  Administered 2022-12-28 – 2022-12-29 (×2): 0.5 mg via ORAL
  Filled 2022-12-28 (×2): qty 1

## 2022-12-28 NOTE — ED Notes (Signed)
TTS Cart in Pts room. TTS Consult begun at this time.

## 2022-12-28 NOTE — ED Notes (Signed)
Pt started panicking after blood draw.  Pt reassured by this Clinical research associate and phlebotomist.  Pt reports she has had blood drawn 36 times in 3 years.  When asked why she gets her blood drawn monthly, the Pt sts "I don't actual get it drawn.  I've had a premonition that it's been drawn."  Pt provided grape juice per request.  Additionally, Pt requesting nicotine gum.  Will make EDP aware.

## 2022-12-28 NOTE — BH Assessment (Signed)
Comprehensive Clinical Assessment (CCA) Note  12/28/2022 Julia [name] [id number]  DISPOSITION: Gave clinical report to Cecilio Asper, NP who determined Pt meets criteria for inpatient psychiatric treatment. AC at [facility] will review for possible admission. Notified Dr. Bethann Berkshire and Cherylin Mylar, RN of recommendation via secure message.  The patient demonstrates the following risk factors for suicide: Chronic risk factors for suicide include: psychiatric disorder of schizoaffective disorder and previous suicide attempts by means that Pt would not disclose . Acute risk factors for suicide include: unemployment and loss (financial, interpersonal, professional). Protective factors for this patient include: positive therapeutic relationship. Considering these factors, the overall suicide risk at this point appears to be low. Patient is not appropriate for outpatient follow up due to psychotic symptoms.  Pt is a 22 year old single female who presents unaccompanied to [facility] ED after being transported voluntarily via Patent examiner. Per medical record, Pt has a diagnosis of schizoaffective disorder with a history of paranoid delusions and hallucinations. She says, "I feel like I am in danger." She states the voices started "after her first premonition" and that something snapped when she "came across a body in [place]." She states the body was left by a serial killer and had an odd-shaped face and ears. Pt says she can hear voices of people who are conspiring to kidnap or harm her. She describes her mood as anxious and depressed. Pt acknowledges symptoms including crying spells, loss of interest in usual pleasures, irritability, decreased concentration, decreased sleep, and feelings of worthlessness and hopelessness. She denies current suicidal ideation but says she has attempted suicide in the past, she would not disclose any details regarding past attempts. She denies current homicidal ideation or  history of violence.  Pt denies alcohol or other substance use. Medical record indicates a history of using alcohol and cocaine in the past. Pt's urine drug screen is positive for benzodiazepines and Pt is prescribed klonopin.  Pt appears guarded and give single word responses to most questions. She cannot identify any stressors other than her mental health symptoms. Pt's medical record indicates a history of homelessness and Pt says she is currently residing with friends. She says she does have friends who support her. She states she is unemployed and not receiving disability. She denies history of abuse. She denies legal problems. She denies access to firearms.  Pt says she is currently receiving medication management with Dr. Geanie Cooley at [facility]. She says she is also receiving individual therapy but cannot remember the name of her therapist. She cannot remember the dates of her next appointments. She says she is prescribed Risperidone, Prozac, and Klonopin and is taking medications as prescribed. Medical record indicates she has received ACTT services in the past but she denies current services. She acknowledges she has been psychiatrically hospitalized in the past but will not disclose the names of the facilities or the dates of hospitalizations. Pt's medical record indicates she has been psychiatrically hospitalized at least three times in [place].   Pt is dressed in hospital scrubs, slightly drowsy, and oriented x4. Pt speaks in a clear tone, at moderate volume and normal pace. Motor behavior appears normal. Eye contact is good. Pt's mood is depressed and anxious, affect is blunted. Thought process is coherent with delusional thought content. Pt's insight is poor and judgment is impaired. She says she is willing to sign voluntarily into a psychiatric facility.   Chief Complaint:  Chief Complaint  Patient presents with   Hallucinations  Visit Diagnosis: F25.1 Schizoaffective disorder, Depressive  type   CCA Screening, Triage and Referral (STR)  Patient Reported Information How did you hear about [place]? Self  What Is the Reason for Your Visit/Call Today? Pt has diagnosis of schizoaffective disorder. She says she feels she is in danger from a serial killer or a group of people who are trying to kidnap her. She reports auditory hallucinations of people talking about her, conspiring to harm her.  How Long Has This Been Causing You Problems? 1-6 months  What Do You Feel Would Help You the Most Today? Treatment for Depression or other mood problem; Medication(s)   Have You Recently Had Any Thoughts About Hurting Yourself? No  Are You Planning to Commit Suicide/Harm Yourself At This time? No   Flowsheet Row ED from 12/28/2022 in [facility] Emergency Department at [facility] ED from 11/05/2022 in [facility] Emergency Department at [facility] ED from 09/13/2022 in [facility] Emergency Department at [facility]  C-SSRS RISK CATEGORY No Risk No Risk No Risk       Have you Recently Had Thoughts About Hurting Someone [name]? No  Are You Planning to Harm Someone at This Time? No  Explanation: Pt denies current suicidal ideation or homicidal ideation   Have You Used Any Alcohol or Drugs in the Past 24 Hours? No  What Did You Use and How Much? Pt denies recent alcohol or substance use.   Do You Currently Have a Therapist/Psychiatrist? Yes  Name of Therapist/Psychiatrist: Name of Therapist/Psychiatrist: Dr Geanie Cooley and therapist at [facility]   Have You Been Recently Discharged From Any Office Practice or Programs? No  Explanation of Discharge From Practice/Program: Pt has not been recently discharged from a practice.     CCA Screening Triage Referral Assessment Type of Contact: Tele-Assessment  Telemedicine Service Delivery: Telemedicine service delivery: This service was provided via telemedicine using a 2-way, interactive audio and video technology  Is this  Initial or Reassessment? Is this Initial or Reassessment?: Initial Assessment  Date Telepsych consult ordered in [facility]:  Date Telepsych consult ordered in [facility]: 12/28/22  Time Telepsych consult ordered in [facility]:  Time Telepsych consult ordered in [facility]: 1716  Location of Assessment: [facility] ED  Provider Location: [facility] Assessment Services   Collateral Involvement: Medical record   Does Patient Have a Automotive engineer Guardian? No  Legal Guardian Contact Information: Pt does not have a legal guardian  Copy of Legal Guardianship Form: -- (Pt does not have a legal guardian)  Legal Guardian Notified of Arrival: -- (Pt does not have a legal guardian)  Legal Guardian Notified of Pending Discharge: -- (Pt does not have a legal guardian)  If Minor and Not Living with Parent(s), Who has Custody? Pt is an adult  Is CPS involved or ever been involved? Never  Is APS involved or ever been involved? Never   Patient Determined To Be At Risk for Harm To Self or Others Based on Review of Patient Reported Information or Presenting Complaint? No  Method: No Plan  Availability of Means: No access or NA  Intent: Vague intent or NA  Notification Required: No need or identified person  Additional Information for Danger to Others Potential: Active psychosis  Additional Comments for Danger to Others Potential: Pt denies history of aggression  Are There Guns or Other Weapons in Your Home? No  Types of Guns/Weapons: Pt denies access to firearms  Are These Weapons Safely Secured?                            -- (  Pt denies access to firearms)  Who Could Verify You Are Able To Have These Secured: Pt denies access to firearms  Do You Have any Outstanding Charges, Pending Court Dates, Parole/Probation? Pt denies legal problems  Contacted To Inform of Risk of Harm To Self or Others: Unable to Contact:    Does Patient Present under Involuntary Commitment? No    [place] of Residence:  [place]   Patient Currently Receiving the Following Services: Individual Therapy; Medication Management   Determination of Need: Emergent (2 hours)   Options For Referral: Inpatient Hospitalization; [facility]; Medication Management; Outpatient Therapy     CCA Biopsychosocial Patient Reported Schizophrenia/Schizoaffective Diagnosis in Past: Yes   Strengths: Pt expressed wanting help.   Mental Health Symptoms Depression:   Difficulty Concentrating; Hopelessness; Worthlessness; Irritability; Sleep (too much or little); Tearfulness   Duration of Depressive symptoms:  Duration of Depressive Symptoms: Greater than two weeks   Mania:   Recklessness; Change in energy/activity; Racing thoughts; Irritability   Anxiety:    Worrying; Tension; Sleep; Restlessness; Irritability; Difficulty concentrating   Psychosis:   Delusions; Hallucinations   Duration of Psychotic symptoms:  Duration of Psychotic Symptoms: Greater than six months   Trauma:   Avoids reminders of event; Emotional numbing   Obsessions:   None   Compulsions:   None   Inattention:   None   Hyperactivity/Impulsivity:   None   Oppositional/Defiant Behaviors:   None   Emotional Irregularity:   Potentially harmful impulsivity   Other Mood/Personality Symptoms:   None noted    Mental Status Exam Appearance and self-care  Stature:   Average   Weight:   Average weight   Clothing:   -- (Scrubs)   Grooming:   Normal   Cosmetic use:   Age appropriate   Posture/gait:   Normal   Motor activity:   Not Remarkable   Sensorium  Attention:   Normal   Concentration:   Normal   Orientation:   X5   Recall/memory:   Normal   Affect and Mood  Affect:   Blunted   Mood:   Anxious; Depressed   Relating  Eye contact:   Normal   Facial expression:   Depressed   Attitude toward examiner:   Cooperative; Guarded   Thought and Language  Speech flow:  Normal   Thought  content:   Delusions; Persecutions   Preoccupation:   Ruminations   Hallucinations:   Auditory; Visual   Organization:   Patent examiner of Knowledge:   Fair   Intelligence:   Average   Abstraction:   Armed forces technical officer:   Impaired   Reality Testing:   Distorted   Insight:   Lacking   Decision Making:   Impulsive   Social Functioning  Social Maturity:   Irresponsible; Impulsive   Social Judgement:   Heedless   Stress  Stressors:   Surveyor, quantity; Work   Coping Ability:   Overwhelmed; Deficient supports   Skill Deficits:   Responsibility; Decision making   Supports:   Friends/Service system     Religion: Religion/Spirituality Are You A Religious Person?: No How Might This Affect Treatment?: Not assessed  Leisure/Recreation: Leisure / Recreation Do You Have Hobbies?: No  Exercise/Diet: Exercise/Diet Do You Exercise?: No Have You Gained or Lost A Significant Amount of Weight in the Past Six Months?: No Do You Follow a Special Diet?: No Do You Have Any Trouble Sleeping?: Yes Explanation of Sleeping Difficulties: Pt reports poor  sleep   CCA Employment/Education Employment/Work Situation: Employment / Work Situation Employment Situation: Unemployed Patient's Job has Been Impacted by Current Illness: No Has Patient ever Been in Equities trader?: No  Education: Education Is Patient Currently Attending School?: No Last Grade Completed: 12 Did You Product manager?: No Did You Have An Individualized Education Program (IIEP): No Did You Have Any Difficulty At [employer]?: No Patient's Education Has Been Impacted by Current Illness: No   CCA Family/Childhood History Family and Relationship History: Family history Marital status: Single Does patient have children?: No  Childhood History:  Childhood History By whom was/is the patient raised?: Both parents Did patient suffer any verbal/emotional/physical/sexual abuse as a  child?: No Did patient suffer from severe childhood neglect?: No Has patient ever been sexually abused/assaulted/raped as an adolescent or adult?: No Was the patient ever a victim of a crime or a disaster?: No Witnessed domestic violence?: No Has patient been affected by domestic violence as an adult?: No       CCA Substance Use Alcohol/Drug Use: Alcohol / Drug Use Pain Medications: See MAR Prescriptions: See MAR Over the Counter: See MAR History of alcohol / drug use?: Yes (Pt denies substance use. Pt's medical record indicates she has used alcohol and cocaine in the past.) Longest period of sobriety (when/how long): Unknown Negative Consequences of Use:  (None) Withdrawal Symptoms:  (None)                         ASAM's:  Six Dimensions of Multidimensional Assessment  Dimension 1:  Acute Intoxication and/or Withdrawal Potential:   Dimension 1:  Description of individual's past and current experiences of substance use and withdrawal: Pt denies recent alcohol or substance use  Dimension 2:  Biomedical Conditions and Complications:   Dimension 2:  Description of patient's biomedical conditions and  complications: Pt denies recent alcohol or substance use  Dimension 3:  Emotional, Behavioral, or Cognitive Conditions and Complications:  Dimension 3:  Description of emotional, behavioral, or cognitive conditions and complications: Pt denies recent alcohol or substance use  Dimension 4:  Readiness to Change:  Dimension 4:  Description of Readiness to Change criteria: Pt denies recent alcohol or substance use  Dimension 5:  Relapse, Continued use, or Continued Problem Potential:  Dimension 5:  Relapse, continued use, or continued problem potential critiera description: Pt denies recent alcohol or substance use  Dimension 6:  Recovery/Living Environment:  Dimension 6:  Recovery/Iiving environment criteria description: Pt denies recent alcohol or substance use  ASAM Severity  Score: ASAM's Severity Rating Score: 0  ASAM Recommended Level of Treatment:     Substance use Disorder (SUD) Substance Use Disorder (SUD)  Checklist Symptoms of Substance Use:  (NA)  Recommendations for Services/Supports/Treatments: Recommendations for Services/Supports/Treatments Recommendations For Services/Supports/Treatments:  (NA)  Discharge Disposition:    DSM5 Diagnoses: Patient Active Problem List   Diagnosis Date Noted   Suicidal ideations    Substance-induced disorder (HCC) 04/27/2021   Malingering 06/02/2020   Homicidal ideation 06/02/2020   Paranoia (HCC) 06/02/2020   Suicidal behavior 06/02/2020   Schizophrenia (HCC) 06/02/2020   Schizoaffective disorder (HCC) 05/08/2020   Schizophrenia, paranoid (HCC)    Cocaine dependence with cocaine-induced psychotic disorder with complication [facility])    Social anxiety disorder 09/25/2016   Polysubstance abuse (HCC)    Dysmenorrhea 12/30/2013     Referrals to Alternative Service(s): Referred to Alternative Service(s):   Place:   Date:   Time:    Referred to  Alternative Service(s):   Place:   Date:   Time:    Referred to Alternative Service(s):   Place:   Date:   Time:    Referred to Alternative Service(s):   Place:   Date:   Time:     Pamalee Leyden, [facility]

## 2022-12-28 NOTE — ED Notes (Signed)
ED Provider at bedside. 

## 2022-12-28 NOTE — ED Provider Notes (Signed)
[place] EMERGENCY DEPARTMENT AT [name] Provider Note   CSN: [id number] Arrival date & time: 12/28/22  1656     History {Add pertinent medical, surgical, social history, OB history to HPI:1} Chief Complaint  Patient presents with   Hallucinations    Julia [name] is a 22 y.o. female.  HPI     Home Medications Prior to Admission medications   Medication Sig Start Date End Date Taking? Authorizing Provider  clonazePAM (KLONOPIN) 0.5 MG tablet Take 0.5 mg by mouth 2 (two) times daily. 12/13/22  Yes [provider]  haloperidol (HALDOL) 5 MG tablet Take 5 mg by mouth See admin instructions. Take 1.5 (7.5 mg) by mouth at bedtime. 08/01/22  Yes [provider]  nicotine (NICODERM CQ - DOSED IN MG/24 HOURS) 14 mg/24hr patch Place onto the skin. 08/01/22  Yes [provider]  nicotine polacrilex (NICORETTE) 4 MG gum Take by mouth. 08/01/22  Yes [provider]  risperiDONE (RISPERDAL) 3 MG tablet Take 3 mg by mouth at bedtime. 12/17/22  Yes [provider]  divalproex (DEPAKOTE ER) 250 MG 24 hr tablet Take 900 mg by mouth 2 (two) times daily. Patient not taking: Reported on 05/25/2021    [provider]  FLUoxetine (PROZAC) 20 MG capsule Take 20 mg by mouth daily. 09/17/21   [provider]  guanFACINE (INTUNIV) 2 MG TB24 ER tablet Take 2 mg by mouth daily. 09/17/21   [provider]  hydrOXYzine (ATARAX) 50 MG tablet Take 50 mg by mouth every 6 (six) hours as needed for anxiety. 09/17/21   [provider]  paliperidone (INVEGA SUSTENNA) 156 MG/ML SUSY injection Inject 156 mg into the muscle every 28 (twenty-eight) days. 06/27/21   [provider]  polyethylene glycol (MIRALAX) 17 g packet Take 17 g by mouth daily. 09/11/22   Long, Arlyss Repress, MD  QUEtiapine (SEROQUEL) 300 MG tablet Take 300 mg by mouth at bedtime. 09/17/21   [provider]  risperiDONE (RISPERDAL) 2 MG tablet Take 2 mg by  mouth 2 (two) times daily. 09/17/21   [provider]      Allergies    Abilify [aripiprazole], Tylenol [acetaminophen], Zoloft [sertraline hcl], and Haloperidol    Review of Systems   Review of Systems  Physical Exam Updated Vital Signs BP 97/69 (BP Location: Left Arm)   Pulse 100   Temp 98.4 F (36.9 C) (Oral)   Resp 18   Ht 5\' 4"  (1.626 m)   Wt 78.2 kg   LMP 12/26/2022 (Exact Date)   SpO2 98%   BMI 29.59 kg/m  Physical Exam  ED Results / Procedures / Treatments   Labs (all labs ordered are listed, but only abnormal results are displayed) Labs Reviewed  COMPREHENSIVE METABOLIC PANEL - Abnormal; Notable for the following components:      Result Value   Potassium 3.4 (*)    All other components within normal limits  SALICYLATE LEVEL - Abnormal; Notable for the following components:   Salicylate Lvl <7.0 (*)    All other components within normal limits  ACETAMINOPHEN LEVEL - Abnormal; Notable for the following components:   Acetaminophen (Tylenol), Serum <10 (*)    All other components within normal limits  RAPID URINE DRUG SCREEN, HOSP PERFORMED - Abnormal; Notable for the following components:   Benzodiazepines POSITIVE (*)    All other components within normal limits  ETHANOL  CBC  POC URINE PREG, ED    EKG None  Radiology  No results found.  Procedures Procedures  {Document cardiac monitor, telemetry assessment procedure when appropriate:1}  Medications Ordered in ED Medications  risperiDONE (RISPERDAL) tablet 3 mg (3 mg Oral Given 12/28/22 2133)  FLUoxetine (PROZAC) capsule 20 mg (has no administration in time range)  clonazePAM (KLONOPIN) tablet 0.5 mg (0.5 mg Oral Given 12/28/22 2132)  nicotine (NICODERM CQ - dosed in mg/24 hours) patch 14 mg (0 mg Transdermal Hold 12/28/22 2133)    ED Course/ Medical Decision Making/ A&P Clinical Course as of 12/28/22 2241  Sat Dec 28, 2022  1919 Called Sheriff's Office to get a story on the patient since  she was dropped of and left without report to nursing staff. Speaking with Sheriff's Office right now who do not know since this happened on day shift, but will look into this.  [RR]  1922 I was transferred to the jail as well and no one from the sheriff's department is familiar with a female patient dropped off.  [RR]  2137 Was able to speak in on the phone with the patient's caregiver, Julia [name]. The patient was hallucinating a girl names "Valetta Mole" that was coming to hurt her. Rachelle called the police because she was unable to control her to bring her to the ER. We reviewed the medications she is on. She denies that the patient was hurt or strangled.  [RR]    Clinical Course User Index [RR] Achille Rich, PA-C   {   Click here for ABCD2, HEART and other calculatorsREFRESH Note before signing :1}                          Medical Decision Making Amount and/or Complexity of Data Reviewed Labs: ordered.  Risk OTC drugs. Prescription drug management.   ***  {Document critical care time when appropriate:1} {Document review of labs and clinical decision tools ie heart score, Chads2Vasc2 etc:1}  {Document your independent review of radiology images, and any outside records:1} {Document your discussion with family members, caretakers, and with consultants:1} {Document social determinants of health affecting pt's care:1} {Document your decision making why or why not admission, treatments were needed:1} Final Clinical Impression(s) / ED Diagnoses Final diagnoses:  Hallucination    Rx / DC Orders ED Discharge Orders     None

## 2022-12-28 NOTE — ED Notes (Signed)
Paged security to come wand patient.

## 2022-12-28 NOTE — ED Triage Notes (Signed)
Pt via Sheriffs Dept reporting auditory and visual hallucinations. She states the voices started "after her first premonition" and that something snapped when she "came across a body in [place]." She states the body was left by a serial killer and had an odd-shaped face and ears. Pt also says the voices in her head sound really real and are bullying her; she says they are not telling her to hurt herself or anyone else. Pt denies SI/HI. She is unsure of PMH but says she has rx for klonopin, risperidal, and prozac. She states she is compliant with medications.

## 2022-12-28 NOTE — ED Notes (Signed)
Pts Aide Rochelle called and updated on Pt status. EDP spoke with [name] over the telephone to verify collateral information as well on Pts living arrangements, and daily habits.

## 2022-12-29 ENCOUNTER — Other Ambulatory Visit: Payer: Self-pay

## 2022-12-29 ENCOUNTER — Encounter (HOSPITAL_COMMUNITY): Payer: Self-pay | Admitting: Psychiatry

## 2022-12-29 ENCOUNTER — Inpatient Hospital Stay (HOSPITAL_COMMUNITY)
Admission: AD | Admit: 2022-12-29 | Discharge: 2023-01-03 | DRG: 885 | Disposition: A | Discharge status: Home / Self Care | Payer: BC Managed Care – PPO | Source: Intra-hospital | Attending: Addiction Medicine | Admitting: Addiction Medicine

## 2022-12-29 DIAGNOSIS — Z560 Unemployment, unspecified: Secondary | ICD-10-CM

## 2022-12-29 DIAGNOSIS — F419 Anxiety disorder, unspecified: Secondary | ICD-10-CM | POA: Diagnosis present

## 2022-12-29 DIAGNOSIS — Z8249 Family history of ischemic heart disease and other diseases of the circulatory system: Secondary | ICD-10-CM

## 2022-12-29 DIAGNOSIS — Z9151 Personal history of suicidal behavior: Secondary | ICD-10-CM | POA: Diagnosis not present

## 2022-12-29 DIAGNOSIS — Z79899 Other long term (current) drug therapy: Secondary | ICD-10-CM | POA: Diagnosis not present

## 2022-12-29 DIAGNOSIS — Z83438 Family history of other disorder of lipoprotein metabolism and other lipidemia: Secondary | ICD-10-CM

## 2022-12-29 DIAGNOSIS — F17210 Nicotine dependence, cigarettes, uncomplicated: Secondary | ICD-10-CM | POA: Diagnosis present

## 2022-12-29 DIAGNOSIS — F251 Schizoaffective disorder, depressive type: Secondary | ICD-10-CM | POA: Diagnosis present

## 2022-12-29 DIAGNOSIS — Z818 Family history of other mental and behavioral disorders: Secondary | ICD-10-CM

## 2022-12-29 DIAGNOSIS — T50906A Underdosing of unspecified drugs, medicaments and biological substances, initial encounter: Secondary | ICD-10-CM | POA: Diagnosis present

## 2022-12-29 DIAGNOSIS — Z91148 Patient's other noncompliance with medication regimen for other reason: Secondary | ICD-10-CM | POA: Diagnosis not present

## 2022-12-29 DIAGNOSIS — F319 Bipolar disorder, unspecified: Secondary | ICD-10-CM | POA: Diagnosis present

## 2022-12-29 DIAGNOSIS — R7303 Prediabetes: Secondary | ICD-10-CM | POA: Diagnosis present

## 2022-12-29 MED ORDER — MAGNESIUM HYDROXIDE 400 MG/5ML PO SUSP: 30.0000 mL | Freq: Every day | ORAL | Status: DC | PRN

## 2022-12-29 MED ORDER — NICOTINE POLACRILEX 2 MG MT GUM
2.0000 mg | CHEWING_GUM | OROMUCOSAL | Status: DC | PRN
  Administered 2022-12-29 – 2023-01-03 (×24): 2 mg via ORAL
  Filled 2022-12-29 (×9): qty 1

## 2022-12-29 MED ORDER — DIPHENHYDRAMINE HCL 25 MG PO CAPS
50.0000 mg | ORAL_CAPSULE | Freq: Three times a day (TID) | ORAL | Status: DC | PRN
  Administered 2023-01-01: 50 mg via ORAL
  Filled 2022-12-29: qty 2

## 2022-12-29 MED ORDER — FLUOXETINE HCL 20 MG PO CAPS
20.0000 mg | ORAL_CAPSULE | Freq: Every day | ORAL | Status: DC
  Administered 2022-12-30 – 2023-01-03 (×5): 20 mg via ORAL
  Filled 2022-12-29 (×6): qty 1

## 2022-12-29 MED ORDER — LORAZEPAM 2 MG/ML IJ SOLN
2.0000 mg | Freq: Three times a day (TID) | INTRAMUSCULAR | Status: DC | PRN
  Administered 2022-12-30: 2 mg via INTRAMUSCULAR
  Filled 2022-12-29: qty 1

## 2022-12-29 MED ORDER — TRAZODONE HCL 50 MG PO TABS
50.0000 mg | ORAL_TABLET | Freq: Every evening | ORAL | Status: DC | PRN
  Administered 2022-12-29: 50 mg via ORAL
  Filled 2022-12-29 (×3): qty 1

## 2022-12-29 MED ORDER — ALUM & MAG HYDROXIDE-SIMETH 200-200-20 MG/5ML PO SUSP: 30.0000 mL | ORAL | Status: DC | PRN

## 2022-12-29 MED ORDER — DIPHENHYDRAMINE HCL 50 MG/ML IJ SOLN
50.0000 mg | Freq: Three times a day (TID) | INTRAMUSCULAR | Status: DC | PRN
  Administered 2022-12-30: 50 mg via INTRAMUSCULAR
  Filled 2022-12-29: qty 1

## 2022-12-29 MED ORDER — LORAZEPAM 1 MG PO TABS: 2.0000 mg | ORAL_TABLET | Freq: Three times a day (TID) | ORAL | Status: DC | PRN

## 2022-12-29 NOTE — Group Note (Signed)
Date:  12/29/2022 Time:  8:51 PM  Group Topic/Focus:  Wrap-Up Group:   The focus of this group is to help patients review their daily goal of treatment and discuss progress on daily workbooks.    Participation Level:  Active  Participation Quality:  Appropriate  Affect:  Appropriate  Cognitive:  Appropriate  Insight: Appropriate  Engagement in Group:  Engaged  Modes of Intervention:  Education and Exploration  Additional Comments:  Patient attended and participated in group tonight. She reports that she just came in the hospital today.Thelma Barge, Aaralyn Kil Dacosta 12/29/2022, 8:51 PM

## 2022-12-29 NOTE — Progress Notes (Addendum)
Pt was accepted to [facility] TODAY  12/29/2022; Bed Assignment 507  At 1422 CSW updated by [facility] that transfer is on HOLD until maintenance is completed on room.  Pt meets inpatient criteria per Cecilio Asper, NP   Attending Physician will be Dr.Attiah  Report can be called to: -Adult unit: [phone]  Pt can arrive after: [facility] will update care team. DO NOT SEND PT UNTIL UPDATE PLEASE.  Care Team notified: Day [facility] Sharyne Peach, RN, Ernestina Columbia, LCSWA 12/29/2022 @ 1:58 PM

## 2022-12-29 NOTE — ED Notes (Signed)
Pt returned to ED due to room not ready at [facility].

## 2022-12-29 NOTE — ED Notes (Signed)
Safe transport called to schedule transportation to [facility].

## 2022-12-29 NOTE — ED Notes (Signed)
Voluntary consent form faxed to [name] at [phone]

## 2022-12-29 NOTE — ED Provider Notes (Signed)
Emergency Medicine Observation Re-evaluation Note  Julia [name] is a 22 y.o. female, seen on rounds today.  Pt initially presented to the ED for complaints of Hallucinations Currently, the patient is asleep.  Pt came in last night for severe depression.  [facility] did eval this am and she meets criteria for inpatient placement.  She is voluntary.  Physical Exam  BP 97/69 (BP Location: Left Arm)   Pulse 100   Temp 98.4 F (36.9 C) (Oral)   Resp 18   Ht 5\' 4"  (1.626 m)   Wt 78.2 kg   LMP 12/26/2022 (Exact Date)   SpO2 98%   BMI 29.59 kg/m  Physical Exam General: awake and alert Cardiac: rr Lungs: clear Psych: calm  ED Course / MDM  EKG:   I have reviewed the labs performed to date as well as medications administered while in observation.  Recent changes in the last 24 hours include [facility] eval.  Plan  Current plan is for inpatient placement.    Jacalyn Lefevre, MD 12/29/22 [phone]

## 2022-12-29 NOTE — ED Notes (Signed)
Called safe transport at this time currently on a call then will head this way. lisa

## 2022-12-29 NOTE — Tx Team (Signed)
Initial Treatment Plan 12/29/2022 6:52 PM Julia [name] ONG:[id number]    PATIENT STRESSORS: Other: mental illness, acute exacerbation of psychosis     PATIENT STRENGTHS: Ability for insight  Active sense of humor  Average or above average intelligence  Capable of independent living  Communication skills    PATIENT IDENTIFIED PROBLEMS: Auditory hallucinations Paranoia ptsd                     DISCHARGE CRITERIA:  Ability to meet basic life and health needs Adequate post-discharge living arrangements Improved stabilization in mood, thinking, and/or behavior Medical problems require only outpatient monitoring Motivation to continue treatment in a less acute level of care Need for constant or close observation no longer present Reduction of life-threatening or endangering symptoms to within safe limits Safe-care adequate arrangements made Verbal commitment to aftercare and medication compliance  PRELIMINARY DISCHARGE PLAN: Attend PHP/IOP  PATIENT/FAMILY INVOLVEMENT: This treatment plan has been presented to and reviewed with the patient, Julia [name], and/or family member, .  The patient and family have been given the opportunity to ask questions and make suggestions.  Malva Limes, RN 12/29/2022, 6:52 PM

## 2022-12-29 NOTE — Progress Notes (Signed)
Patient ID: [name], female   DOB: November 11, 2000, 22 y.o.   MRN: [id number] Patient admitted to [facility] voluntarily from [facility]. Patient upon admission is cooperative, and states '' I don't know how they got it in my record that the TV talks to me, that's not the issue. But I'm here for psychosis and my ptsd. ''pt gives vague details about what brings her to the hospital, but ultimately concludes with paranoia and states '' I think my phone was hacked when I was 13 and that I am being targeted and stalked. It's not someone in particular just a vibe. ''  Pt shares she feels '' more stressed out where I'm living '' but is not able to connect or correlate any current stressors. She does agree that she is having auditory hallucinations.   She is cooperative, but hesitant and appears suspicious at times. She denies any SI or HI currently. Pt oriented to unit. Skin and belongings searched, no contraband found, no skin issues noted. Pt given meal tray and educated on phone use.   Primary RN notified of patient arrival to the unit. Pt is safe, will con't to monitor.

## 2022-12-30 ENCOUNTER — Encounter (HOSPITAL_COMMUNITY): Payer: Self-pay

## 2022-12-30 DIAGNOSIS — F251 Schizoaffective disorder, depressive type: Secondary | ICD-10-CM

## 2022-12-30 MED ORDER — POLYETHYLENE GLYCOL 3350 17 G PO PACK: 17.0000 g | PACK | Freq: Every day | ORAL | Status: DC | PRN

## 2022-12-30 MED ORDER — ALUM & MAG HYDROXIDE-SIMETH 200-200-20 MG/5ML PO SUSP: 30.0000 mL | ORAL | Status: DC | PRN

## 2022-12-30 MED ORDER — CHLORDIAZEPOXIDE HCL 25 MG PO CAPS
25.0000 mg | ORAL_CAPSULE | Freq: Two times a day (BID) | ORAL | Status: AC
  Administered 2023-01-01 – 2023-01-02 (×2): 25 mg via ORAL
  Filled 2022-12-30 (×2): qty 1

## 2022-12-30 MED ORDER — RISPERIDONE 0.5 MG PO TABS
1.5000 mg | ORAL_TABLET | Freq: Two times a day (BID) | ORAL | Status: DC
  Administered 2022-12-30 – 2022-12-31 (×2): 1.5 mg via ORAL
  Filled 2022-12-30 (×8): qty 3

## 2022-12-30 MED ORDER — SENNA 8.6 MG PO TABS: 1.0000 | ORAL_TABLET | Freq: Every evening | ORAL | Status: DC | PRN

## 2022-12-30 MED ORDER — CHLORDIAZEPOXIDE HCL 25 MG PO CAPS
25.0000 mg | ORAL_CAPSULE | Freq: Once | ORAL | Status: AC
  Administered 2023-01-02: 25 mg via ORAL
  Filled 2022-12-30: qty 1

## 2022-12-30 MED ORDER — CHLORDIAZEPOXIDE HCL 25 MG PO CAPS
25.0000 mg | ORAL_CAPSULE | Freq: Three times a day (TID) | ORAL | Status: AC
  Administered 2022-12-31 – 2023-01-01 (×3): 25 mg via ORAL
  Filled 2022-12-30 (×3): qty 1

## 2022-12-30 MED ORDER — ONDANSETRON HCL 4 MG PO TABS: 8.0000 mg | ORAL_TABLET | Freq: Three times a day (TID) | ORAL | Status: DC | PRN

## 2022-12-30 MED ORDER — BISMUTH SUBSALICYLATE 262 MG PO CHEW: 524.0000 mg | CHEWABLE_TABLET | ORAL | Status: DC | PRN

## 2022-12-30 MED ORDER — CHLORDIAZEPOXIDE HCL 25 MG PO CAPS
25.0000 mg | ORAL_CAPSULE | Freq: Four times a day (QID) | ORAL | Status: AC
  Administered 2022-12-30 – 2022-12-31 (×3): 25 mg via ORAL
  Filled 2022-12-30 (×3): qty 1

## 2022-12-30 MED ORDER — HYDROXYZINE HCL 25 MG PO TABS
25.0000 mg | ORAL_TABLET | Freq: Three times a day (TID) | ORAL | Status: DC | PRN
  Filled 2022-12-30 (×2): qty 1

## 2022-12-30 NOTE — Progress Notes (Signed)
Pt escorted back to unit from cafeteria during dinner for increasing psychotic symptoms. Pt actively responding to internal stimuli and fixated on belief of sexual activities occuring. Pt screaming things of sexual nature. Pt received agitation protocol IM. Pt returned to room at this time. Will monitor for efficacy.

## 2022-12-30 NOTE — Progress Notes (Signed)
   12/29/22 2200  Psych Admission Type (Psych Patients Only)  Admission Status Voluntary  Psychosocial Assessment  Patient Complaints Suspiciousness  Eye Contact Fair  Facial Expression Worried  Affect Anxious;Preoccupied  Speech Soft  Interaction Remote  Motor Activity Pacing  Appearance/Hygiene In scrubs  Behavior Characteristics Pacing  Mood Labile;Suspicious  Thought Process  Coherency Disorganized  Content Paranoia  Delusions Paranoid  Perception Hallucinations  Hallucination Auditory  Judgment Limited  Confusion WDL  Danger to Self  Current suicidal ideation? Denies  Danger to Others  Danger to Others None reported or observed   Patient complaint with medications has been pacing and appears to be responding to internal stimuli. Patient has been laughing inappropriately. Denies SI/HI. No medications adverse effects noted.

## 2022-12-30 NOTE — [facility] Suicide Risk Assessment (Signed)
[facility] INPATIENT:  Family/Significant Other Suicide Prevention Education  Suicide Prevention Education:  Education Completed; [name], caretaker, friend, [phone]  (name of family member/significant other) has been identified by the patient as the family member/significant other with whom the patient will be residing, and identified as the person(s) who will aid the patient in the event of a mental health crisis (suicidal ideations/suicide attempt).  With written consent from the patient, the family member/significant other has been provided the following suicide prevention education, prior to the and/or following the discharge of the patient.  Support reports that she has kept Celissa for the past 3 months and has not had any issues during that time.  She reports that this is the first incident of voices and her having increased anxiety and a panic around them.  She reports she does not tolerate drugs and knows patient takes klonopin but would rather her not be on any addictive substances due to her substance use history.  Pt has not been using drugs while with her.  She reports that she may need a change of medications.  No safety issues and no access to guns/weapons.   The suicide prevention education provided includes the following: Suicide risk factors Suicide prevention and interventions National Suicide Hotline telephone number [facility] assessment telephone number [facility] Emergency Assistance 911 [facility] and/or Residential Mobile Crisis Unit telephone number  Request made of family/significant other to: Remove weapons (e.g., guns, rifles, knives), all items previously/currently identified as safety concern.   Remove drugs/medications (over-the-counter, prescriptions, illicit drugs), all items previously/currently identified as a safety concern.  The family member/significant other verbalizes understanding of the suicide prevention education information  provided.  The family member/significant other agrees to remove the items of safety concern listed above.  Emagene Merfeld E Jhace Fennell 12/30/2022, 1:32 PM

## 2022-12-30 NOTE — Group Note (Signed)
Recreation Therapy Group Note   Group Topic:Coping Skills  Group Date: 12/30/2022 Start Time: 1015 End Time: 1025 Facilitators: Shray Hunley-McCall, LRT,CTRS Location: [address] Dayroom   Goal Area(s) Addresses: Patient will define what a coping skill is. Patient will work with  to create a list of healthy coping skills beginning with each letter of the alphabet. Patient will successfully identify positive coping skills they can use post d/c.  Patient will acknowledge benefit(s) of using learned coping skills post d/c.   Group Description: Coping A to Z. Patient asked to identify what a coping skill is and when they use them. Patients with Clinical research associate discussed healthy versus unhealthy coping skills. Next patients were given a blank worksheet titled "Coping Skills A-Z".  Patients were instructed to come up with at least one positive coping skill per letter of the alphabet, addressing a specific challenge (ex: stress, anger, anxiety, depression, grief, doubt, isolation, self-harm/suicidal thoughts, substance use). Patients were given 15 minutes to brainstorm before ideas were presented to the large group. Patients and LRT debriefed on the importance of coping skill selection based on situation and back-up plans when a skill tried is not effective. At the end of group, patients were given an handout of alphabetized strategies to keep for future reference.   Affect/Mood: Flat   Participation Level: Minimal   Participation Quality: Independent   Behavior: Disinterested   Speech/Thought Process: Relevant   Insight: Fair   Judgement: Fair    Modes of Intervention: Worksheet   Patient Response to Interventions:  Resistant    Education Outcome:  In group clarification offered    Clinical Observations/Individualized Feedback: Pt showed very little interest in group session.  Pt was limited in engagement during group.  Pt was quiet even with prompting.  Pt seemed disinterested throughout  group session.  Pt was to come up with coping skills for depression.  Pt came up with aerobics, fishing and drawing.     Plan: Continue to engage patient in RT group sessions 2-3x/week.   Issis Lindseth-McCall, LRT,CTRS 12/30/2022 12:58 PM

## 2022-12-30 NOTE — Progress Notes (Signed)
Pt pacing unit throughout the shift. Pt did attend rec therapy group with encouragement. Pt presents preoccupied as evidenced by responding to internal stimuli and laughing inappropriately. Some anxiety noted. Pt denies SI/HI. Q 15 minute checks ongoing.

## 2022-12-30 NOTE — H&P (Signed)
Psychiatric Admission Assessment Adult  Patient Identification: Julia [name] MRN:  [id number] Date of Evaluation:  12/30/2022  Chief Complaint:  Schizoaffective disorder, depressive type (HCC) [F25.1],  Schizoaffective disorder, depressive type (HCC)  Principal Problem:   Schizoaffective disorder, depressive type (HCC)   History of Present Illness:  Julia [name] is a 22 y.o., female with a past psychiatric history significant for tobacco use disorder, social anxiety disorder, and schizoaffective disorder and who presents to the [facility] Voluntary from [facility] Emergency Department for evaluation and management of auditory and visual hallucinations.   Patient says she started having seizures in 2020, but later says it was 2022. She said she started seeing pictures and blinking lights in 2020. She says she hears jokes, mean jokes that are slow. She says she she feels like "something is feeding off me."  At some point in the interview, patient pauses with her mouth open and appears to be in pain, then saying "that hurt. My head hurts."  Other snippets of the conversation: "The state of [place] is out to get me" "It was the [employer]" "It would look like orange and blue, with a little ball.. I see orange too, but it's a seizure thing" "I keep hearing what other people are thinking" "Remember that premonition I had? It's like the world is exploding" "I've been on the dark web before, and it's seriously messed up." "I spoke to [name] the head of the [employer]. I took a really bad drug when I was 15 and this is probably the effects of it - I didn't flip out until I was 18" "I'm seeing mystery men, it's like multiple personalities"  Patient says I may call [name], who is her friend's parent, for collateral. She says cousin [name] won't listen, and neither will [name].  Chart review: On chart review, prior to this evaluation, patient has had multiple  psychiatry admissions at [facility] hospitals for suicidal ideations and AVH.  Subjective Sleep past 24 hours: fair Subjective Appetite past 24 hours: good  Collateral information obtained ([name], patient's friend's parent) Called x 2, no response   During the third call, I was able to speak to [name] who says she is Heritage manager. She said patient was prescribed Klonopin by her PCP and she took it away from patient as she has a long history of addiction to substances.  Collateral information obtained ([name]) Per Dr. [name], patient said she drank every day. He said she was a new patient to him presenting for management of allergies. He continued her on her clonazepam, risperidone, and fluoxetine but does not believe she sees a psychiatrist at this time.  Past Psychiatric History:  Previous psych diagnoses:  Schizophrenia, bipolar Prior inpatient psychiatric treatment:  Multiple previous admissions Current/prior outpatient psychiatric treatment:  [facility] Current psychiatric provider:  don't know name  Neuromodulation history: denies  Current therapist: Denies Psychotherapy hx: Denies  History of suicide attempts:  once at age 14 History of homicide: Denies  Psychotropic medications: Current Risperidone 3 mg at bedtime - patient reportedly taking consistently, helps ease mind Fluoxetine 20 mg daily- patient reportedly taking consistently, helps quiet voices Clonazepam 0.5 mg BID - patient reportedly taking consistently, helps her anxiety  Past Denies other past trials  Allergies: NKA  Substance Use History: Alcohol: never drinks  --------  Tobacco: smokes, don't know how much Marijuana: denies Cocaine: denies Methamphetamines: denies Psilocybin: denies MDMA: denies Ecstasy: denies Opiates: denies Benzodiazepines:  takes prescribed clonazepam IV drug use: denies Prescribed meds abuse: denies  History of detox: N/A History of  rehab: N/A  Is the patient at risk to self? Unknown Has the patient been a risk to self in the past 6 months? Unknown Has the patient been a risk to self within the distant past? Yes Is the patient a risk to others? No Has the patient been a risk to others in the past 6 months? No Has the patient been a risk to others within the distant past? No  Alcohol Screening: 1. How often do you have a drink containing alcohol?: Never 2. How many drinks containing alcohol do you have on a typical day when you are drinking?: 1 or 2 3. How often do you have six or more drinks on one occasion?: Never AUDIT-C Score: 0 Alcohol Brief Interventions/Follow-up: Alcohol education/Brief advice Tobacco Screening:    Substance Abuse History in the last 12 months: Yes  Past Medical/Surgical History:  Medical Diagnoses: pre-diabetic Home Rx: denies Prior Hosp: hospitalized for seizures Prior Surgeries / non-head trauma: denies  Head trauma: denies LOC: denies Concussions: denies Seizures: endorses  Last menstrual period and contraceptives:  on period right now, not on any contraceptives  Family History:  Medical: denies Psych: denies Psych Rx: denies Suicide: denies Homicide: denies Substance use family hx: denies  Social History:  Place of birth and grew up where: born in [place], lived [place] Abuse: no history of abuse Marital Status: single Sexual orientation: straight Children: none Employment: unemployed Highest level of education: high school Housing: lives with Tax inspector (caretaker) Finances:  waiting on disability Legal: no Special educational needs teacher: never served Consulting civil engineer: denies Pills stockpile: denies  Lab Results:  Results for orders placed or performed during the hospital encounter of 12/28/22 (from the past 48 hour(s))  Rapid urine drug screen (hospital performed)     Status: Abnormal   Collection Time: 12/28/22  5:16 PM  Result Value Ref Range   Opiates NONE  DETECTED NONE DETECTED   Cocaine NONE DETECTED NONE DETECTED   Benzodiazepines POSITIVE (A) NONE DETECTED   Amphetamines NONE DETECTED NONE DETECTED   Tetrahydrocannabinol NONE DETECTED NONE DETECTED   Barbiturates NONE DETECTED NONE DETECTED    Comment: (NOTE) DRUG SCREEN FOR MEDICAL PURPOSES ONLY.  IF CONFIRMATION IS NEEDED FOR ANY PURPOSE, NOTIFY LAB WITHIN 5 DAYS.  LOWEST DETECTABLE LIMITS FOR URINE DRUG SCREEN Drug Class                     Cutoff (ng/mL) Amphetamine and metabolites    1000 Barbiturate and metabolites    200 Benzodiazepine                 200 Opiates and metabolites        300 Cocaine and metabolites        300 THC                            50 Performed at [facility], [address], [place], [place] [address]   Comprehensive metabolic panel     Status: Abnormal   Collection Time: 12/28/22  5:51 PM  Result Value Ref Range   Sodium 138 135 - 145 mmol/L   Potassium 3.4 (L) 3.5 - 5.1 mmol/L   Chloride 107 98 - 111 mmol/L   CO2 25 22 - 32 mmol/L   Glucose, Bld 90 70 - 99 mg/dL  Comment: Glucose reference range applies only to samples taken after fasting for at least 8 hours.   BUN 12 6 - 20 mg/dL   Creatinine, Ser 6.30 0.44 - 1.00 mg/dL   Calcium 8.9 8.9 - 16.0 mg/dL   Total Protein 6.8 6.5 - 8.1 g/dL   Albumin 3.7 3.5 - 5.0 g/dL   AST 17 15 - 41 U/L   ALT 16 0 - 44 U/L   Alkaline Phosphatase 66 38 - 126 U/L   Total Bilirubin 0.3 0.3 - 1.2 mg/dL   GFR, Estimated >10 >93 mL/min    Comment: (NOTE) Calculated using the CKD-EPI Creatinine Equation (2021)    Anion gap 6 5 - 15    Comment: Performed at [facility], [address], [place], [place] [address]  Ethanol     Status: None   Collection Time: 12/28/22  5:51 PM  Result Value Ref Range   Alcohol, Ethyl (B) <10 <10 mg/dL    Comment: (NOTE) Lowest detectable limit for serum alcohol is 10 mg/dL.  For medical purposes only. Performed at [facility], [address], [place], [place] [address]    Salicylate level     Status: Abnormal   Collection Time: 12/28/22  5:51 PM  Result Value Ref Range   Salicylate Lvl <7.0 (L) 7.0 - 30.0 mg/dL    Comment: Performed at [facility], [address], [place], [place] [address]  Acetaminophen level     Status: Abnormal   Collection Time: 12/28/22  5:51 PM  Result Value Ref Range   Acetaminophen (Tylenol), Serum <10 (L) 10 - 30 ug/mL    Comment: (NOTE) Therapeutic concentrations vary significantly. A range of 10-30 ug/mL  may be an effective concentration for many patients. However, some  are best treated at concentrations outside of this range. Acetaminophen concentrations >150 ug/mL at 4 hours after ingestion  and >50 ug/mL at 12 hours after ingestion are often associated with  toxic reactions.  Performed at [facility], [address], [place], [place] [address]   cbc     Status: None   Collection Time: 12/28/22  5:51 PM  Result Value Ref Range   WBC 8.9 4.0 - 10.5 K/uL   RBC 4.65 3.87 - 5.11 MIL/uL   Hemoglobin 13.3 12.0 - 15.0 g/dL   HCT 28.3 15.1 - 76.1 %   MCV 88.4 80.0 - 100.0 fL   MCH 28.6 26.0 - 34.0 pg   MCHC 32.4 30.0 - 36.0 g/dL   RDW 60.7 37.1 - 06.2 %   Platelets 230 150 - 400 K/uL   nRBC 0.0 0.0 - 0.2 %    Comment: Performed at [facility], [address], [place], [place] [address]  POC urine preg, ED     Status: None   Collection Time: 12/28/22  7:28 PM  Result Value Ref Range   Preg Test, Ur NEGATIVE NEGATIVE    Comment:        THE SENSITIVITY OF THIS METHODOLOGY IS >24 mIU/mL     Blood Alcohol level:  Lab Results  Component Value Date   ETH <10 12/28/2022   ETH <10 09/13/2022    Metabolic Disorder Labs:  Lab Results  Component Value Date   HGBA1C 5.4 05/09/2020   MPG 108 05/09/2020   MPG 111.15 03/17/2020   Lab Results  Component Value Date   PROLACTIN 142.0 (H) 05/10/2020   PROLACTIN 113.0 (H) 11/11/2019   Lab Results  Component Value Date   CHOL 138 10/16/2019  TRIG 60 10/16/2019    HDL 36 (L) 10/16/2019   CHOLHDL 3.8 10/16/2019   VLDL 12 10/16/2019   LDLCALC 90 10/16/2019   LDLCALC 96 05/17/2019    Current Medications: Current Facility-Administered Medications  Medication Dose Route Frequency Provider Last Rate Last Admin   hydrOXYzine (ATARAX) tablet 25 mg  25 mg Oral TID PRN Augusto Gamble, MD       And   bismuth subsalicylate (PEPTO BISMOL) chewable tablet 524 mg  524 mg Oral Q3H PRN Augusto Gamble, MD       And   senna (SENOKOT) tablet 8.6 mg  1 tablet Oral QHS PRN Augusto Gamble, MD       And   polyethylene glycol (MIRALAX / GLYCOLAX) packet 17 g  17 g Oral Daily PRN Augusto Gamble, MD       And   ondansetron [facility]) tablet 8 mg  8 mg Oral Q8H PRN Augusto Gamble, MD       And   alum & mag hydroxide-simeth (MAALOX/MYLANTA) 200-200-20 MG/5ML suspension 30 mL  30 mL Oral Q4H PRN Augusto Gamble, MD       chlordiazePOXIDE (LIBRIUM) capsule 25 mg  25 mg Oral QID Augusto Gamble, MD       Followed by   Melene Muller ON 12/31/2022] chlordiazePOXIDE (LIBRIUM) capsule 25 mg  25 mg Oral TID Augusto Gamble, MD       Followed by   Melene Muller ON 01/01/2023] chlordiazePOXIDE (LIBRIUM) capsule 25 mg  25 mg Oral BID Augusto Gamble, MD       Followed by   Melene Muller ON 01/02/2023] chlordiazePOXIDE (LIBRIUM) capsule 25 mg  25 mg Oral Once Augusto Gamble, MD       diphenhydrAMINE (BENADRYL) capsule 50 mg  50 mg Oral TID PRN Oneta Rack, NP       Or   diphenhydrAMINE (BENADRYL) injection 50 mg  50 mg Intramuscular TID PRN Oneta Rack, NP       FLUoxetine (PROZAC) capsule 20 mg  20 mg Oral Daily Oneta Rack, NP   20 mg at 12/30/22 0753   LORazepam (ATIVAN) tablet 2 mg  2 mg Oral TID PRN Oneta Rack, NP       Or   LORazepam (ATIVAN) injection 2 mg  2 mg Intramuscular TID PRN Oneta Rack, NP       nicotine polacrilex (NICORETTE) gum 2 mg  2 mg Oral PRN Oneta Rack, NP   2 mg at 12/30/22 1247   risperiDONE (RISPERDAL) tablet 1.5 mg  1.5 mg Oral BID Augusto Gamble, MD       traZODone (DESYREL) tablet 50  mg  50 mg Oral QHS PRN Oneta Rack, NP   50 mg at 12/29/22 2054    PTA Medications: Medications Prior to Admission  Medication Sig Dispense Refill Last Dose   clonazePAM (KLONOPIN) 0.5 MG tablet Take 0.5 mg by mouth 2 (two) times daily.   12/27/2022   FLUoxetine (PROZAC) 20 MG capsule Take 20 mg by mouth daily.   12/27/2022   risperiDONE (RISPERDAL) 3 MG tablet Take 3 mg by mouth at bedtime.   12/27/2022    Physical Findings: AIMS: No  CIWA:    COWS:     Psychiatric Specialty Exam: Presentation  General Appearance: Disheveled   Eye Contact:Good   Speech:Blocked; Normal Rate   Speech Volume:Normal   Handedness:No data recorded   Mood and Affect  Mood:-- ("good")   Affect:Flat    Thought Process  Thought Processes:Disorganized   Descriptions of Associations:Loose   Orientation:Full (Time, Place and Person)   Thought Content:Illogical      Hallucinations:Hallucinations: Auditory; Visual   Ideas of Reference:Paranoia   Suicidal Thoughts:Suicidal Thoughts: No   Homicidal Thoughts:Homicidal Thoughts: No    Sensorium  Memory:Immediate Good; Recent Good; Remote Good   Judgment:Good   Insight:Poor    Executive Functions  Concentration:Fair   Attention Span:Fair   Recall:Good   Fund of Knowledge:Good   Language:Fair    Psychomotor Activity  Psychomotor Activity:Psychomotor Activity: Normal    Assets  Assets:Desire for Improvement    Sleep  Sleep:Sleep: Fair    Nutritional Assessment (For OBS and FBC admissions only) Has the patient recently lost weight without trying?: 0 Has the patient been eating poorly because of a decreased appetite?: 0 Malnutrition Screening Tool Score: 0   Review of Systems  Constitutional: Negative.   Respiratory: Negative.    Cardiovascular: Negative.   Gastrointestinal:  Positive for abdominal pain.  Genitourinary: Negative.    Blood pressure 109/68, pulse (!) 111, temperature  98.3 F (36.8 C), temperature source Oral, resp. rate 16, height 5\' 5"  (1.651 m), weight 78.9 kg, last menstrual period 12/26/2022, SpO2 98 %. Body mass index is 28.96 kg/m. Physical Exam HENT:     Head: Normocephalic.  Pulmonary:     Effort: Pulmonary effort is normal.  Neurological:     General: No focal deficit present.     Mental Status: She is alert.    Assets  Assets:Desire for Improvement   Treatment Plan Summary: Daily contact with patient to assess and evaluate symptoms and progress in treatment and medication management  ASSESSMENT: Patient clearly presents acutely psychotic at this moment.  She also identifies prior diagnosis of schizophrenia and bipolar disorder, while upon chart review it was identified as schizoaffective disorder.  In any case, treatment should not change between these different diagnoses.  It would be appropriate at this time to restart the patient's risperidone and fluoxetine, monitoring symptom response as her hospitalization progresses.  Since medication nonadherence seems to be an issue, patient may benefit from receiving risperidone LAI at time of discharge  I spoke to the patient's primary care doctor who said he was continuing her clonazepam.  I do not think it is a good idea for the patient to be on chronic benzos due to its addictive nature.  During this admission, we will attempt to do a chlordiazepoxide taper to wean patient off benzodiazepines.  PLAN: Safety and Monitoring:  -- Voluntary admission to inpatient psychiatric unit for safety, stabilization and treatment  -- Daily contact with patient to assess and evaluate symptoms and progress in treatment  -- Patient's case to be discussed in multi-disciplinary team meeting  -- Observation Level : q15 minute checks  -- Vital signs: q12 hours  -- Precautions: suicide, elopement, and assault  2. Interventions (medications, psychoeducation, etc):   -- Start risperidone 1.5 mg twice a day for  psychotic symptoms and mood stabilization  -- Start chlordiazepoxide 25 mg 4 times daily, then 25 mg 3 times daily, then 25 mg twice daily, then 25 mg once, then discontinue altogether  -- Continue fluoxetine 20 mg daily for depressive symptoms  -- Patient in need of nicotine replacement; nicotine polacrilex (gum) ordered. Smoking cessation encouraged  PRN medications for symptomatic management:              -- start hydroxyzine 25 mg three times a day as needed for anxiety              --  start bismuth subsalicylate 524 mg oral chewable tablet every 3 hours as needed for diarrhea / loose stools              -- start senna 8.6 mg oral at bedtime and polyethylene glycol 17 g oral daily as needed for mild to moderate constipation              -- start ondansetron 8 mg every 8 hours as needed for nausea or vomiting              -- start aluminum-magnesium hydroxide + simethicone 30 mL every 4 hours as needed for heartburn or indigestion  The risks/benefits/side-effects/alternatives to the above medication were discussed in detail with the patient and time was given for questions. The patient consents to medication trial. FDA black box warnings, if present, were discussed.  The patient is agreeable with the medication plan, as above. We will monitor the patient's response to pharmacologic treatment, and adjust medications as necessary.  3. Routine and other pertinent labs: EKG monitoring: QTc: 449 (12/29/2022)  Metabolism / endocrine: BMI: Body mass index is 28.96 kg/m. Prolactin: Lab Results  Component Value Date   PROLACTIN 142.0 (H) 05/10/2020   PROLACTIN 113.0 (H) 11/11/2019   Lipid Panel: Lab Results  Component Value Date   CHOL 138 10/16/2019   TRIG 60 10/16/2019   HDL 36 (L) 10/16/2019   CHOLHDL 3.8 10/16/2019   VLDL 12 10/16/2019   LDLCALC 90 10/16/2019   LDLCALC 96 05/17/2019   HbgA1c: Hgb A1c MFr Bld (%)  Date Value  05/09/2020 5.4   TSH: TSH (uIU/mL)  Date Value   05/10/2020 0.354    Drugs of Abuse     Component Value Date/Time   LABOPIA NONE DETECTED 12/28/2022 1716   COCAINSCRNUR NONE DETECTED 12/28/2022 1716   LABBENZ POSITIVE (A) 12/28/2022 1716   AMPHETMU NONE DETECTED 12/28/2022 1716   THCU NONE DETECTED 12/28/2022 1716   LABBARB NONE DETECTED 12/28/2022 1716     4. Group Therapy:  -- Encouraged patient to participate in unit milieu and in scheduled group therapies   -- Short Term Goals: Ability to identify changes in lifestyle to reduce recurrence of condition, verbalize feelings, identify and develop effective coping behaviors, maintain clinical measurements within normal limits, and identify triggers associated with substance abuse/mental health issues will improve. Improvement in ability to demonstrate self-control and comply with prescribed medications.  -- Long Term Goals: Improvement in symptoms so as ready for discharge -- Patient is encouraged to participate in group therapy while admitted to the psychiatric unit. -- We will address other chronic and acute stressors, which contributed to the patient's Schizoaffective disorder, depressive type (HCC) in order to reduce the risk of self-harm at discharge.  5. Discharge Planning:   -- Social work and case management to assist with discharge planning and identification of hospital follow-up needs prior to discharge  -- Estimated LOS: 5-7 days  -- Discharge Concerns: Need to establish a safety plan; Medication compliance and effectiveness  -- Discharge Goals: Return home with outpatient referrals for mental health follow-up including medication management/psychotherapy  I certify that inpatient services furnished can reasonably be expected to improve the patient's condition.    I discussed my assessment, planned testing and intervention for the patient with Dr. Abbott Pao who agrees with my formulated course of action.  Signed: Augusto Gamble, MD 12/30/2022, 1:50 PM

## 2022-12-30 NOTE — Progress Notes (Signed)
Recreation Therapy Notes  INPATIENT RECREATION THERAPY ASSESSMENT  Patient Details Name: Julia [name] MRN: [id number] DOB: Nov 07, 2000 Today's Date: 12/30/2022       Information Obtained From: Patient  Able to Participate in Assessment/Interview: Yes  Patient Presentation: Alert  Reason for Admission (Per Patient): Other (Comments) (Hallucinations)  Patient Stressors:  (None)  Coping Skills:   Art, Other (Comment), Exercise (Aerobics; Fishing)  Leisure Interests (2+):  Art - Draw, Art - Coloring  Frequency of Recreation/Participation: Other (Comment) (Sometimes)  Awareness of Community Resources:  Yes  Community Resources:  [employer], [employer], [employer]  Current Use: Yes  If no, Barriers?:    Expressed Interest in [employer] Information: No  [employer] of Residence:  [place]  Patient Main Form of Transportation: Other (Comment) ("other people")  Patient Strengths:  Enthusiastic; Positive; Mindful  Patient Identified Areas of Improvement:  "None"  Patient Goal for Hospitalization:  "to work on hallucinations"  Current SI (including self-harm):  No  Current HI:  No  Current AVH: No  Staff Intervention Plan: Group Attendance, Collaborate with Interdisciplinary Treatment Team  Consent to Intern Participation: N/A   Beverlyn Mcginness-McCall, LRT,CTRS Annalysia Willenbring A Thurmond Hildebran-McCall 12/30/2022, 1:15 PM

## 2022-12-30 NOTE — BH IP Treatment Plan (Signed)
Interdisciplinary Treatment and Diagnostic Plan Update  12/30/2022 Time of Session: 11:00 AM  Julia [name] MRN: [id number]  Principal Diagnosis: Schizoaffective disorder, depressive type (HCC)  Secondary Diagnoses: Principal Problem:   Schizoaffective disorder, depressive type (HCC)   Current Medications:  Current Facility-Administered Medications  Medication Dose Route Frequency Provider Last Rate Last Admin   hydrOXYzine (ATARAX) tablet 25 mg  25 mg Oral TID PRN Augusto Gamble, MD       And   bismuth subsalicylate (PEPTO BISMOL) chewable tablet 524 mg  524 mg Oral Q3H PRN Augusto Gamble, MD       And   senna (SENOKOT) tablet 8.6 mg  1 tablet Oral QHS PRN Augusto Gamble, MD       And   polyethylene glycol (MIRALAX / GLYCOLAX) packet 17 g  17 g Oral Daily PRN Augusto Gamble, MD       And   ondansetron [facility]) tablet 8 mg  8 mg Oral Q8H PRN Augusto Gamble, MD       And   alum & mag hydroxide-simeth (MAALOX/MYLANTA) 200-200-20 MG/5ML suspension 30 mL  30 mL Oral Q4H PRN Augusto Gamble, MD       chlordiazePOXIDE (LIBRIUM) capsule 25 mg  25 mg Oral QID Augusto Gamble, MD   25 mg at 12/30/22 1426   Followed by   Melene Muller ON 12/31/2022] chlordiazePOXIDE (LIBRIUM) capsule 25 mg  25 mg Oral TID Augusto Gamble, MD       Followed by   Melene Muller ON 01/01/2023] chlordiazePOXIDE (LIBRIUM) capsule 25 mg  25 mg Oral BID Augusto Gamble, MD       Followed by   Melene Muller ON 01/02/2023] chlordiazePOXIDE (LIBRIUM) capsule 25 mg  25 mg Oral Once Augusto Gamble, MD       diphenhydrAMINE (BENADRYL) capsule 50 mg  50 mg Oral TID PRN Oneta Rack, NP       Or   diphenhydrAMINE (BENADRYL) injection 50 mg  50 mg Intramuscular TID PRN Oneta Rack, NP       FLUoxetine (PROZAC) capsule 20 mg  20 mg Oral Daily Oneta Rack, NP   20 mg at 12/30/22 0753   LORazepam (ATIVAN) tablet 2 mg  2 mg Oral TID PRN Oneta Rack, NP       Or   LORazepam (ATIVAN) injection 2 mg  2 mg Intramuscular TID PRN Oneta Rack, NP       nicotine  polacrilex (NICORETTE) gum 2 mg  2 mg Oral PRN Oneta Rack, NP   2 mg at 12/30/22 1426   risperiDONE (RISPERDAL) tablet 1.5 mg  1.5 mg Oral BID Augusto Gamble, MD   1.5 mg at 12/30/22 1426   traZODone (DESYREL) tablet 50 mg  50 mg Oral QHS PRN Oneta Rack, NP   50 mg at 12/29/22 2054   PTA Medications: Medications Prior to Admission  Medication Sig Dispense Refill Last Dose   clonazePAM (KLONOPIN) 0.5 MG tablet Take 0.5 mg by mouth 2 (two) times daily.   12/27/2022   FLUoxetine (PROZAC) 20 MG capsule Take 20 mg by mouth daily.   12/27/2022   risperiDONE (RISPERDAL) 3 MG tablet Take 3 mg by mouth at bedtime.   12/27/2022    Patient Stressors: Other: mental illness, acute exacerbation of psychosis    Patient Strengths: Ability for insight  Active sense of humor  Average or above average intelligence  Capable of independent living  Communication skills   Treatment Modalities: Medication Management, Group  therapy, Case management,  1 to 1 session with clinician, Psychoeducation, Recreational therapy.   Physician Treatment Plan for Primary Diagnosis: Schizoaffective disorder, depressive type (HCC) Long Term Goal(s):     Short Term Goals:    Medication Management: Evaluate patient's response, side effects, and tolerance of medication regimen.  Therapeutic Interventions: 1 to 1 sessions, Unit Group sessions and Medication administration.  Evaluation of Outcomes: Not Progressing  Physician Treatment Plan for Secondary Diagnosis: Principal Problem:   Schizoaffective disorder, depressive type (HCC)  Long Term Goal(s):     Short Term Goals:       Medication Management: Evaluate patient's response, side effects, and tolerance of medication regimen.  Therapeutic Interventions: 1 to 1 sessions, Unit Group sessions and Medication administration.  Evaluation of Outcomes: Not Progressing   RN Treatment Plan for Primary Diagnosis: Schizoaffective disorder, depressive type (HCC) Long  Term Goal(s): Knowledge of disease and therapeutic regimen to maintain health will improve  Short Term Goals: Ability to remain free from injury will improve, Ability to verbalize frustration and anger appropriately will improve, Ability to demonstrate self-control, Ability to participate in decision making will improve, Ability to verbalize feelings will improve, Ability to disclose and discuss suicidal ideas, Ability to identify and develop effective coping behaviors will improve, and Compliance with prescribed medications will improve  Medication Management: RN will administer medications as ordered by provider, will assess and evaluate patient's response and provide education to patient for prescribed medication. RN will report any adverse and/or side effects to prescribing provider.  Therapeutic Interventions: 1 on 1 counseling sessions, Psychoeducation, Medication administration, Evaluate responses to treatment, Monitor vital signs and CBGs as ordered, Perform/monitor CIWA, COWS, AIMS and Fall Risk screenings as ordered, Perform wound care treatments as ordered.  Evaluation of Outcomes: Not Progressing   LCSW Treatment Plan for Primary Diagnosis: Schizoaffective disorder, depressive type (HCC) Long Term Goal(s): Safe transition to appropriate next level of care at discharge, Engage patient in therapeutic group addressing interpersonal concerns.  Short Term Goals: Engage patient in aftercare planning with referrals and resources, Increase social support, Increase ability to appropriately verbalize feelings, Increase emotional regulation, Facilitate acceptance of mental health diagnosis and concerns, Facilitate patient progression through stages of change regarding substance use diagnoses and concerns, and Identify triggers associated with mental health/substance abuse issues  Therapeutic Interventions: Assess for all discharge needs, 1 to 1 time with Social worker, Explore available resources  and support systems, Assess for adequacy in community support network, Educate family and significant other(s) on suicide prevention, Complete Psychosocial Assessment, Interpersonal group therapy.  Evaluation of Outcomes: Not Progressing   Progress in Treatment: Attending groups: Yes. Participating in groups: No. Taking medication as prescribed: Yes. Toleration medication: No. Family/Significant other contact made: Yes, individual(s) contacted:   Jenkins Rouge (friend mother)- [phone] Patient understands diagnosis: No. Discussing patient identified problems/goals with staff: Yes. Medical problems stabilized or resolved: Yes. Denies suicidal/homicidal ideation: Yes. Issues/concerns per patient self-inventory: No.   New problem(s) identified: No, Describe:  None reported   New Short Term/Long Term Goal(s):medication stabilization, elimination of SI thoughts, development of comprehensive mental wellness plan.    Patient Goals:  " work on my auditory hallucinations "   Discharge Plan or Barriers: Patient recently admitted. CSW will continue to follow and assess for appropriate referrals and possible discharge planning.    Reason for Continuation of Hospitalization: Anxiety Depression Hallucinations Medication stabilization  Estimated Length of Stay: 5-7 days   Last 3 [place] Suicide Severity Risk Score: Flowsheet Row Admission (Current)  [facility]28/[facility] ED from 11/05/2022 in [facility]  C-SSRS RISK CATEGORY No Risk No Risk No Risk       Last [facility] 2/9 Scores:    07/11/2016    2:57 PM  Depression screen PHQ 2/9  Decreased Interest 0  Down, Depressed, Hopeless 0  PHQ - 2 Score 0  Altered sleeping 0  Tired, decreased energy 0  Change in appetite 0  Feeling bad or failure about yourself  0  Trouble  concentrating 0  Moving slowly or fidgety/restless 0  Suicidal thoughts 0  PHQ-9 Score 0    Scribe for Treatment Team: Beather Arbour 12/30/2022 2:55 PM

## 2022-12-30 NOTE — BHH Counselor (Signed)
Adult Comprehensive Assessment  Patient ID: Julia [name], female   DOB: [birth year], 22 y.o.   MRN: [id number]  Information Source: Information source: Patient  Current Stressors:  Patient states their primary concerns and needs for treatment are:: Patient states that "I had a mental breakdown and then I had a seizure" Patient states their goals for this hospitilization and ongoing recovery are:: Patient states that she feels like she is good and just needs to keep her current medications Educational / Learning stressors: no stressors Employment / Job issues: unemployed, patient states that the auditory hallucinations make it hard to work and she is working on getting disability Family Relationships: patient states that mom and dad passed away Surveyor, quantity / Lack of resources (include bankruptcy): no income Housing / Lack of housing: patient lives with her friend and friends mom Physical health (include injuries & life threatening diseases): patient reports that she has seizures Social relationships: good support system Substance abuse: denies substances. Bereavement / Loss: patient states that she is still affected by the loss of mother and father in 2019 and 2020  Living/Environment/Situation:  Living Arrangements: Non-relatives/Friends Living conditions (as described by patient or guardian): Patient lives with friend and friend's mom Who else lives in the home?: friend and friends mom How long has patient lived in current situation?: 3 months What is atmosphere in current home: Supportive, Comfortable  Family History:  Marital status: Single Are you sexually active?: No What is your sexual orientation?: heterosexual Has your sexual activity been affected by drugs, alcohol, medication, or emotional stress?: n/a  Does patient have children?: No  Childhood History:  By whom was/is the patient raised?: Both parents Additional childhood history information: both parents raised her;  divorced when she was 22yo Description of patient's relationship with caregiver when they were a child: Close to both growing up.  However, mother left when patient was 14yo to live in [place]. Patient's description of current relationship with people who raised him/her: patient states that mother and father passed away How were you disciplined when you got in trouble as a child/adolescent?: Items taken away Does patient have siblings?: No Did patient suffer any verbal/emotional/physical/sexual abuse as a child?: No Did patient suffer from severe childhood neglect?: No Has patient ever been sexually abused/assaulted/raped as an adolescent or adult?: No Was the patient ever a victim of a crime or a disaster?: No Witnessed domestic violence?: No Has patient been affected by domestic violence as an adult?: No  Education:  Highest grade of school patient has completed: high school Currently a Consulting civil engineer?: No Learning disability?: No  Employment/Work Situation:   Employment Situation: Unemployed Patient's Job has Been Impacted by Current Illness: No What is the Longest Time Patient has Held a Job?: A few days Where was the Patient Employed at that Time?: Restaurant Has Patient ever Been in the [employer]?: No  Financial Resources:   Financial resources: No income Does patient have a Lawyer or guardian?: No  Alcohol/Substance Abuse:   What has been your use of drugs/alcohol within the last 12 months?: patient denies If attempted suicide, did drugs/alcohol play a role in this?: No Alcohol/Substance Abuse Treatment Hx: Denies past history  Social Support System:   Forensic psychologist System: Fair Museum/gallery exhibitions officer System: friends, Artist and internal medicine doctors  Leisure/Recreation:   Do You Have Hobbies?: Yes Leisure and Hobbies: Pt states that she likes listening to music and arts and crafts  Strengths/Needs:   What is the patient's perception  of  their strengths?: patietn states "I am enthusiastic" Patient states they can use these personal strengths during their treatment to contribute to their recovery: yes Patient states these barriers may affect/interfere with their treatment: none Patient states these barriers may affect their return to the community: none Other important information patient would like considered in planning for their treatment: none  Discharge Plan:   Currently receiving community mental health services: Yes (From Whom) ([facility] and internal medicine doctor) Patient states concerns and preferences for aftercare planning are: none Patient states they will know when they are safe and ready for discharge when: patient states that she is ready to discharge Does patient have access to transportation?: Yes Does patient have financial barriers related to discharge medications?: No Patient description of barriers related to discharge medications: none Will patient be returning to same living situation after discharge?: Yes  Summary/Recommendations:   Summary and Recommendations (to be completed by the evaluator): Julia [name] is a 22 year old female who was admitted to [facility] for auditory hallucinations and acting bizarre.  Patient states, "I had a mental breakdown and a seizure." She reports that she took a drug at 52 and now has ongoing auditory hallucinations.  She reports that she is working on getting disability and does not have a job.  She lives with her friend and her friend's mother.  Patient states she has been living there for 3 months.  Patient is currently going to counseling with [facility] and gets meds prescribed to her by her internal medicine doctor.  She has a past history of schizoaffective disorder.  Patient states that her parents died a few years ago.  While here, Julia [name] can benefit from crisis stabilization, medication management, therapeutic milieu, and referrals for services.   Julia [name]. 12/30/2022

## 2022-12-30 NOTE — BHH Suicide Risk Assessment (Signed)
[facility] Admission Suicide Risk Assessment  Nursing information obtained from:  Review of record, Patient Demographic factors:  Caucasian, Low socioeconomic status, Adolescent or young adult Current Mental Status:  NA Loss Factors:  Decrease in vocational status Historical Factors:  Impulsivity, Prior suicide attempts Risk Reduction Factors:  Positive social support  Total Time spent with patient: 1.5 hours Principal Problem: Schizoaffective disorder, depressive type (HCC) Diagnosis:  Principal Problem:   Schizoaffective disorder, depressive type (HCC)   Subjective Data: patient denies any suicidal ideations  Continued Clinical Symptoms:     CLINICAL FACTORS:   Alcohol/Substance Abuse/Dependencies Schizophrenia:   Less than 1 years old  Psychiatric Specialty Exam: Presentation  General Appearance: Disheveled     Eye Contact:Good     Speech:Blocked; Normal Rate     Speech Volume:Normal     Handedness:No data recorded     Mood and Affect  Mood:-- ("good")     Affect:Flat       Thought Process  Thought Processes:Disorganized     Descriptions of Associations:Loose     Orientation:Full (Time, Place and Person)     Thought Content:Illogical       Hallucinations:Hallucinations: Auditory; Visual     Ideas of Reference:Paranoia     Suicidal Thoughts:Suicidal Thoughts: No     Homicidal Thoughts:Homicidal Thoughts: No       Sensorium  Memory:Immediate Good; Recent Good; Remote Good     Judgment:Good     Insight:Poor       Executive Functions  Concentration:Fair     Attention Span:Fair     Recall:Good     Fund of Knowledge:Good     Language:Fair       Psychomotor Activity  Psychomotor Activity:Psychomotor Activity: Normal       Assets  Assets:Desire for Improvement       Sleep  Sleep:Sleep: Fair       Nutritional Assessment (For OBS and FBC admissions only) Has the patient recently lost weight without trying?:  0 Has the patient been eating poorly because of a decreased appetite?: 0 Malnutrition Screening Tool Score: 0     Review of Systems  Constitutional: Negative.   Respiratory: Negative.    Cardiovascular: Negative.   Gastrointestinal:  Positive for abdominal pain.  Genitourinary: Negative.     Blood pressure 109/68, pulse (!) 111, temperature 98.3 F (36.8 C), temperature source Oral, resp. rate 16, height 5\' 5"  (1.651 m), weight 78.9 kg, last menstrual period 12/26/2022, SpO2 98 %. Body mass index is 28.96 kg/m. Physical Exam HENT:     Head: Normocephalic.  Pulmonary:     Effort: Pulmonary effort is normal.  Neurological:     General: No focal deficit present.     Mental Status: She is alert.   COGNITIVE FEATURES THAT CONTRIBUTE TO RISK:  Loss of executive function    SUICIDE RISK:  Acute Risk:  Mild:  Suicidal ideation of limited frequency, intensity, duration, and specificity.  There are no identifiable plans, no associated intent, mild dysphoria and related symptoms, good self-control (both objective and subjective assessment), few other risk factors, and identifiable protective factors, including available and accessible social support.  Chronic Risk:  Mild:  Suicidal ideation of limited frequency, intensity, duration, and specificity.  There are no identifiable plans, no associated intent, mild dysphoria and related symptoms, good self-control (both objective and subjective assessment), few other risk factors, and identifiable protective factors, including available and accessible social support.  PLAN OF CARE: see H&P for full plan of care  I certify  that inpatient services furnished can reasonably be expected to improve the patient's condition.   Signed: Augusto Gamble, MD 12/30/2022, 2:15 PM

## 2022-12-31 DIAGNOSIS — F251 Schizoaffective disorder, depressive type: Secondary | ICD-10-CM | POA: Diagnosis not present

## 2022-12-31 MED ORDER — CHLORDIAZEPOXIDE HCL 25 MG PO CAPS: 25.0000 mg | ORAL_CAPSULE | Freq: Three times a day (TID) | ORAL | Status: DC | PRN

## 2022-12-31 MED ORDER — RISPERIDONE 1 MG PO TABS
1.0000 mg | ORAL_TABLET | Freq: Three times a day (TID) | ORAL | Status: DC
  Administered 2022-12-31 – 2023-01-01 (×2): 1 mg via ORAL
  Filled 2022-12-31 (×5): qty 1

## 2022-12-31 MED ORDER — OLANZAPINE 10 MG IM SOLR: 5.0000 mg | Freq: Three times a day (TID) | INTRAMUSCULAR | Status: DC | PRN

## 2022-12-31 MED ORDER — OLANZAPINE 5 MG PO TBDP
5.0000 mg | ORAL_TABLET | Freq: Three times a day (TID) | ORAL | Status: DC | PRN
  Administered 2023-01-01 – 2023-01-02 (×2): 5 mg via ORAL
  Filled 2022-12-31 (×2): qty 1

## 2022-12-31 NOTE — Progress Notes (Signed)
   12/31/22 2200  Psych Admission Type (Psych Patients Only)  Admission Status Voluntary  Psychosocial Assessment  Patient Complaints Anxiety  Eye Contact Fair  Facial Expression Animated  Affect Preoccupied  Speech Logical/coherent  Interaction Guarded  Motor Activity Pacing  Appearance/Hygiene Unremarkable  Behavior Characteristics Appropriate to situation  Mood Preoccupied  Thought Process  Coherency Tangential  Content Preoccupation  Delusions None reported or observed  Perception WDL  Hallucination None reported or observed  Judgment Impaired  Confusion None  Danger to Self  Current suicidal ideation? Denies  Agreement Not to Harm Self Yes  Description of Agreement verbal  Danger to Others  Danger to Others None reported or observed

## 2022-12-31 NOTE — Progress Notes (Signed)
Pt came up to med window and asked if she can get ativan shot because she stated she is feeling anxious. Pt encouraged to use coping skills to include deep breathing and therapeutic communication provided.

## 2022-12-31 NOTE — Group Note (Signed)
Recreation Therapy Group Note   Group Topic:Other  Group Date: 12/31/2022 Start Time: 1047 End Time: 1144 Facilitators: Kraven Calk-McCall, LRT,CTRS Location: [address] Dayroom   Goal Area(s) Addresses:  Patient will identify how music helps express emotions. Patient will successfully share benefits of healthy expression of emotions.     Group Description: Music Therapy.  LRT and patients discussed the importance of music and it's affect on individuals. Patients were then given the opportunity pick songs that ment something to them. LRT would play the songs requested by the patients as long as they were clean and appropriate. Patients would go on to share what those songs mean to them.    Affect/Mood: Flat   Participation Level: Active   Participation Quality: Independent   Behavior: Appropriate   Speech/Thought Process: Relevant   Insight: Fair   Judgement: Fair    Modes of Intervention: Music   Patient Response to Interventions:  Attentive   Education Outcome:  Acknowledges education   Clinical Observations/Individualized Feedback: Pt was flat and needed prompting to engage in group. Pt gave very little interaction during group session. Pt was appropriate during group session.     Plan: Continue to engage patient in RT group sessions 2-3x/week.   Ameliana Brashear-McCall, LRT,CTRS 12/31/2022 12:34 PM

## 2022-12-31 NOTE — Progress Notes (Signed)
Adult Psychoeducational Group Note  Date:  12/31/2022 Time:  10:36 AM  Group Topic/Focus:  Goals Group:   The focus of this group is to help patients establish daily goals to achieve during treatment and discuss how the patient can incorporate goal setting into their daily lives to aide in recovery.  Participation Level:  Did Not Attend  Participation Quality:    Affect:    Cognitive:    Insight:   Engagement in Group:    Modes of Intervention:    Additional Comments:  Pt did not attend group today  Burnett Sheng 12/31/2022, 10:36 AM

## 2022-12-31 NOTE — Group Note (Signed)
Date:  12/31/2022 Time:  8:54 PM  Group Topic/Focus:  Wrap-Up Group:   The focus of this group is to help patients review their daily goal of treatment and discuss progress on daily workbooks.    Participation Level:  Active  Participation Quality:  Appropriate  Affect:  Appropriate  Cognitive:  Appropriate  Insight: Appropriate  Engagement in Group:  Engaged  Modes of Intervention:  Education and Exploration  Additional Comments:  Patient attended and participated in group tonight. She reports that she like that she is a patient person.  Lita Mains [facility] 12/31/2022, 8:54 PM

## 2022-12-31 NOTE — Progress Notes (Signed)
[facility] MD Progress Note  Date: 12/31/2022 4:10 PM Name: Julia [name]  DOB: 11-Jun-2001 MRN: [id number] Unit: [id number]  CC: psychosis  REASON FOR ADMISSION   Julia [name] is a 22 y.o. female with a past psychiatric history significant for tobacco use disorder, social anxiety disorder, and schizoaffective disorder and who presents to the [facility] Voluntary from [facility] for evaluation and management of auditory and visual hallucinations.   Principal Problem: Schizoaffective disorder, depressive type (HCC) Diagnosis: Principal Problem:   Schizoaffective disorder, depressive type (HCC)  CHART REVIEW FROM LAST 24 HOURS   Adherent to scheduled meds: yes  Agitation PRNs: yes Per nursing staff: she was actively responding to stimuli after dinner, had delusions involving sexual topics and screaming, she required agitation PRNs Groups: engaged Documented sleep last 24 hours: 11.75   SUBJECTIVE  Patient was initially seen asleep in bed this AM, easily awoken, no acute distress, hungry. Patient was non-engaged, groggy and irritable during evaluation.  Denied side effects to current medications.   Mood:Dysphoric, Irritable.  When asked what brought her in, patient stated that she had an "almost seizure" where she would see flashing lights. She denied feeling confused. She became more irritable when challenge with information on H&P. She would say, "that wasn't me" then resume eating.  Sleep:Fair, slept well after receiving agitation PRNs Appetite: Good SI:No HI:No WUJ:[id number]  Review of Systems  Respiratory:  Negative for shortness of breath.   Cardiovascular:  Negative for chest pain.  Gastrointestinal:  Negative for nausea and vomiting.  Neurological:  Negative for dizziness and headaches.    HISTORY  Past Psychiatric History:  Previous psych diagnoses:  Schizophrenia, bipolar Prior inpatient psychiatric treatment:  Multiple previous  admissions Current/prior outpatient psychiatric treatment:  [facility] Current psychiatric provider:  don't know name   Neuromodulation history: denies   Current therapist: Denies Psychotherapy hx: Denies   History of suicide attempts:  once at age 53 History of homicide: Denies   Psychotropic medications: Current Risperidone 3 mg at bedtime - patient reportedly taking consistently, helps ease mind Fluoxetine 20 mg daily- patient reportedly taking consistently, helps quiet voices Clonazepam 0.5 mg BID - patient reportedly taking consistently, helps her anxiety   Past Denies other past trials   Allergies: NKA   Substance Use History: Alcohol: never drinks   --------   Tobacco: smokes, don't know how much Marijuana: denies Cocaine: denies Methamphetamines: denies Psilocybin: denies MDMA: denies Ecstasy: denies Opiates: denies Benzodiazepines: takes prescribed clonazepam IV drug use: denies Prescribed meds abuse: denies   History of detox: N/A History of rehab: N/A  Past Medical/Surgical History:  Medical Diagnoses: pre-diabetic Home Rx: denies Prior Hosp: hospitalized for seizures Prior Surgeries / non-head trauma: denies   Head trauma: denies LOC: denies Concussions: denies Seizures: endorses   Last menstrual period and contraceptives:  on period right now, not on any contraceptives   Family History:  Medical: denies Psych: denies Psych Rx: denies Suicide: denies Homicide: denies Substance use family hx: denies   Social History:  Place of birth and grew up where: born in [place], lived [place] Abuse: no history of abuse Marital Status: single Sexual orientation: straight Children: none Employment: unemployed Highest level of education: high school Housing: lives with Tax inspector (caretaker) Finances:  waiting on disability Legal: no Special educational needs teacher: never served Consulting civil engineer: denies Pills stockpile: denies  Past Medical History:   Past Medical History:  Diagnosis Date   Burning with urination 05/03/2015   Contraceptive management  05/03/2015   Depression    Dysmenorrhea 12/30/2013   Heroin addiction (HCC)    Menorrhagia 12/30/2013   Menstrual extraction 12/30/2013   Migraines    Schizophrenia (HCC)    Social anxiety disorder 09/25/2016   Suicidal ideations    Vaginal odor 05/03/2015    Past Surgical History:  Procedure Laterality Date   NO PAST SURGERIES     Family History:  Family History  Problem Relation Age of Onset   Depression Mother    Hypertension Father    Hyperlipidemia Father    Cancer Paternal Grandmother        breast, uterine   Cirrhosis Paternal Grandfather        due to alcohol   Social History:  Social History   Substance and Sexual Activity  Alcohol Use Not Currently   Comment: socially     Social History   Substance and Sexual Activity  Drug Use Not Currently   Types: Methamphetamines   Comment: last used 10/07/21    Social History   Socioeconomic History   Marital status: Single    Spouse name: Not on file   Number of children: Not on file   Years of education: Not on file   Highest education level: Not on file  Occupational History   Occupation: Unemployed  Tobacco Use   Smoking status: Former    Packs/day: 0.50    Years: 1.00    Additional pack years: 0.00    Total pack years: 0.50    Types: Cigarettes    Passive exposure: Past   Smokeless tobacco: Never  Vaping Use   Vaping Use: Never used  Substance and Sexual Activity   Alcohol use: Not Currently    Comment: socially   Drug use: Not Currently    Types: Methamphetamines    Comment: last used 10/07/21   Sexual activity: Yes    Birth control/protection: Condom  Other Topics Concern   Not on file  Social History Narrative   Not on file   Social Determinants of Health   Financial Resource Strain: Not on file  Food Insecurity: No Food Insecurity (12/29/2022)   Hunger Vital Sign    Worried About  Running Out of Food in the Last Year: Never true    Ran Out of Food in the Last Year: Never true  Transportation Needs: No Transportation Needs (12/29/2022)   PRAPARE - Administrator, Civil Service (Medical): No    Lack of Transportation (Non-Medical): No  Physical Activity: Not on file  Stress: Not on file  Social Connections: Not on file   Additional Social History:                         Current Medications: Current Facility-Administered Medications  Medication Dose Route Frequency Provider Last Rate Last Admin   hydrOXYzine (ATARAX) tablet 25 mg  25 mg Oral TID PRN Augusto Gamble, MD       And   bismuth subsalicylate (PEPTO BISMOL) chewable tablet 524 mg  524 mg Oral Q3H PRN Augusto Gamble, MD       And   senna (SENOKOT) tablet 8.6 mg  1 tablet Oral QHS PRN Augusto Gamble, MD       And   polyethylene glycol (MIRALAX / GLYCOLAX) packet 17 g  17 g Oral Daily PRN Augusto Gamble, MD       And   ondansetron [facility]) tablet 8 mg  8 mg Oral Q8H PRN  Augusto Gamble, MD       And   alum & mag hydroxide-simeth (MAALOX/MYLANTA) 200-200-20 MG/5ML suspension 30 mL  30 mL Oral Q4H PRN Augusto Gamble, MD       chlordiazePOXIDE (LIBRIUM) capsule 25 mg  25 mg Oral TID Augusto Gamble, MD   25 mg at 12/31/22 1309   Followed by   Melene Muller ON 01/01/2023] chlordiazePOXIDE (LIBRIUM) capsule 25 mg  25 mg Oral BID Augusto Gamble, MD       Followed by   Melene Muller ON 01/02/2023] chlordiazePOXIDE (LIBRIUM) capsule 25 mg  25 mg Oral Once Augusto Gamble, MD       chlordiazePOXIDE (LIBRIUM) capsule 25 mg  25 mg Oral Q8H PRN Princess Bruins, DO       diphenhydrAMINE (BENADRYL) capsule 50 mg  50 mg Oral TID PRN Oneta Rack, NP       Or   diphenhydrAMINE (BENADRYL) injection 50 mg  50 mg Intramuscular TID PRN Oneta Rack, NP   50 mg at 12/30/22 1752   FLUoxetine (PROZAC) capsule 20 mg  20 mg Oral Daily Oneta Rack, NP   20 mg at 12/31/22 1015   nicotine polacrilex (NICORETTE) gum 2 mg  2 mg Oral PRN Oneta Rack, NP   2 mg at 12/31/22 1456   OLANZapine zydis (ZYPREXA) disintegrating tablet 5 mg  5 mg Oral Q8H PRN Princess Bruins, DO       Or   OLANZapine (ZYPREXA) injection 5 mg  5 mg Intramuscular Q8H PRN Princess Bruins, DO       risperiDONE (RISPERDAL) tablet 1 mg  1 mg Oral TID Princess Bruins, DO       traZODone (DESYREL) tablet 50 mg  50 mg Oral QHS PRN Oneta Rack, NP   50 mg at 12/29/22 2054    OBJECTIVE  BP 120/75 (BP Location: Right Arm)   Pulse (!) 112   Temp 98.3 F (36.8 C) (Oral)   Resp 16   Ht 5\' 5"  (1.651 m)   Wt 78.9 kg   LMP 12/26/2022 (Exact Date)   SpO2 98%   BMI 28.96 kg/m   Physical Exam Physical Exam Vitals and nursing note reviewed.  Constitutional:      General: She is awake. She is not in acute distress.    Appearance: She is not ill-appearing or diaphoretic.  HENT:     Head: Normocephalic.  Pulmonary:     Effort: Pulmonary effort is normal. No respiratory distress.  Neurological:     Mental Status: She is alert.     AIMS:   ,  ,  ,  ,     CIWA:  COWS:    Psychiatric Specialty Exam: Presentation General Appearance: Appropriate for Environment; Casual; Fairly Groomed  Eye Contact: Fair  Speech: Clear and Coherent; Normal Rate  Volume: Normal  Handedness:Right   Mood and Affect  Mood: Dysphoric; Irritable  Affect: Appropriate; Congruent; Flat   Thought Process  Thought Process: Coherent  Descriptions of Associations: Intact   Thought Content Suicidal Thoughts:No  Homicidal Thoughts:No  Hallucinations:None  Ideas of Reference:None  Thought Content:Illogical; Rumination; Perseveration   Sensorium  Memory:Immediate Fair; Recent Fair  Judgment:Impaired  Insight:Poor   Executive Functions  Orientation:Full (Time, Place and Person)  Language:Good  Concentration:Fair  Attention:Fair  Recall:Good  Fund of Knowledge:Good   Psychomotor Activity  Psychomotor Activity:Normal   Assets   Assets:Communication Skills; Desire for Improvement; Resilience   Sleep  Quality:Fair  Documented sleep last 24  hours: 11.75   Lab Results:  No results found for this or any previous visit (from the past 48 hour(s)).  Blood Alcohol level:  Lab Results  Component Value Date   ETH <10 12/28/2022   ETH <10 09/13/2022    Metabolic Disorder Labs: Lab Results  Component Value Date   HGBA1C 5.4 05/09/2020   MPG 108 05/09/2020   MPG 111.15 03/17/2020   Lab Results  Component Value Date   PROLACTIN 142.0 (H) 05/10/2020   PROLACTIN 113.0 (H) 11/11/2019   Lab Results  Component Value Date   CHOL 138 10/16/2019   TRIG 60 10/16/2019   HDL 36 (L) 10/16/2019   CHOLHDL 3.8 10/16/2019   VLDL 12 10/16/2019   LDLCALC 90 10/16/2019   LDLCALC 96 05/17/2019   ASSESSMENT / PLAN  Diagnoses / Active Problems: Principal Problem:   Schizoaffective disorder, depressive type (HCC)   Safety and Monitoring: Voluntary admission to inpatient psychiatric unit for safety, stabilization and treatment    2. Psychiatric Diagnoses and Treatment:   Schizoaffective d/o - bipolar type She was linear and not exhibiting any sxs of psychosis. She does have negative sxs of flat affect. However, per RN, she was delusional and intrusive last night, requiring agitation PRNs. Hopefully change from BID dosing to TID will allow peak of risperdal to prevent evening agitation. Once stable can consolidate rx or administer LAI for patient's convenience  Continued home prozac 20 mg daily CHANGED home risperdal 1.5 mg BID to 1 mg TID Antipsychotic monitoring labs ordered - A1c, lipid panel, TSH  Chronic benzodiazepine use There is med seeking behavior where patient specifically asking for ativan after receiving librium, will change ativan PRN per below. Continued librium taper (6/17-6/20) CHANGED agitation PRN from ativan to librium  3. Medical Issues Being Addressed:  CBC, CMP unremarkable Tylenol level  <10, salicylate level <7, BAL <10  Serum HCG quant negative UDS+benzos QTc 449  4. Discharge Planning:  Tentative Date: TBA  Barrier: psychosis Location: TBA   Treatment Plan Summary: I certify that inpatient services furnished can reasonably be expected to improve the patient's condition.   Daily contact with patient to assess and evaluate symptoms and progress in treatment and Medication management Daily contact with patient to assess and evaluate symptoms and progress in treatment Patient's case to be discussed in multi-disciplinary team meeting Observation Level: q15 minute checks  Vital signs: q12 hours Precautions: suicide, elopement, and assault The risks/benefits/side-effects/alternatives to this medication were discussed in detail with the patient and time was given for questions. The patient consents to medication trial. The patient consents to medication trial. FDA black box warnings, if present, were discussed. Metabolic profile and EKG monitoring obtained while on an atypical antipsychotic  Encouraged patient to participate in unit milieu and in scheduled group therapies  Short Term Goals: Ability to identify changes in lifestyle to reduce recurrence of condition will improve, Ability to verbalize feelings will improve, Ability to disclose and discuss suicidal ideas, Ability to demonstrate self-control will improve, Ability to identify and develop effective coping behaviors will improve, Ability to maintain clinical measurements within normal limits will improve, Compliance with prescribed medications will improve, and Ability to identify triggers associated with substance abuse/mental health issues will improve Long Term Goals: Improvement in symptoms so as ready for discharge Social work and case management to assist with discharge planning and identification of hospital follow-up needs prior to discharge Estimated LOS: 5-7 days Discharge Concerns: Need to establish a safety  plan; Medication compliance and  effectiveness Discharge Goals: Return home with outpatient referrals for mental health follow-up including medication management/psychotherapy  Total Time spent with patient:  Patient's case was discussed with Attending, see attestation for more information.   Signed: Princess Bruins, DO Psychiatry Resident, PGY-2 [facility]  [address] [place], [place] [address] Ph: [phone] Fax: [phone]

## 2022-12-31 NOTE — Progress Notes (Signed)
Pt is yelling and threatening staff. Pt threatens to urinate on window and spit on and hit staff if ativan IM not given. Pt again verbally de-escalated.

## 2022-12-31 NOTE — Progress Notes (Signed)
Dar Note: Patient up and pacing the hallway.  Medications given as prescribed.  Patient observed pacing the hallway after morning medication administration.  Patient asking to receive injection for anxiety right after her morning medication.  Refused PRN medication for anxiety when offered.  Support and encouragement offered as needed.  Did not attend group when invited.  Patient is safe on and off the unit.

## 2022-12-31 NOTE — Progress Notes (Signed)
Patient refused HS medications this shift Patient is sleeping after getting agitation protocol on first shift. Respirations noted. Patient not able to attend programming. Q 15 safety checks ongoing. Patient remain safe.

## 2023-01-01 DIAGNOSIS — F251 Schizoaffective disorder, depressive type: Secondary | ICD-10-CM | POA: Diagnosis not present

## 2023-01-01 MED ORDER — LOPERAMIDE HCL 2 MG PO CAPS: 2.0000 mg | ORAL_CAPSULE | ORAL | Status: DC | PRN

## 2023-01-01 MED ORDER — BISMUTH SUBSALICYLATE 262 MG PO CHEW: 524.0000 mg | CHEWABLE_TABLET | ORAL | Status: DC | PRN

## 2023-01-01 MED ORDER — ALUM & MAG HYDROXIDE-SIMETH 200-200-20 MG/5ML PO SUSP: 30.0000 mL | ORAL | Status: DC | PRN

## 2023-01-01 MED ORDER — RISPERIDONE 3 MG PO TABS
3.0000 mg | ORAL_TABLET | Freq: Every day | ORAL | Status: DC
  Administered 2023-01-02: 3 mg via ORAL
  Filled 2023-01-01: qty 1
  Filled 2023-01-01: qty 3
  Filled 2023-01-01: qty 1

## 2023-01-01 MED ORDER — HYDROXYZINE HCL 50 MG PO TABS: 50.0000 mg | ORAL_TABLET | Freq: Three times a day (TID) | ORAL | Status: DC | PRN

## 2023-01-01 MED ORDER — ONDANSETRON 4 MG PO TBDP: 4.0000 mg | ORAL_TABLET | Freq: Four times a day (QID) | ORAL | Status: DC | PRN

## 2023-01-01 MED ORDER — ONDANSETRON HCL 4 MG PO TABS: 8.0000 mg | ORAL_TABLET | Freq: Three times a day (TID) | ORAL | Status: DC | PRN

## 2023-01-01 MED ORDER — POLYETHYLENE GLYCOL 3350 17 G PO PACK: 17.0000 g | PACK | Freq: Every day | ORAL | Status: DC | PRN

## 2023-01-01 MED ORDER — SENNA 8.6 MG PO TABS: 1.0000 | ORAL_TABLET | Freq: Every evening | ORAL | Status: DC | PRN

## 2023-01-01 MED ORDER — HYDROXYZINE HCL 25 MG PO TABS: 25.0000 mg | ORAL_TABLET | Freq: Four times a day (QID) | ORAL | Status: DC | PRN

## 2023-01-01 NOTE — Group Note (Signed)
Date:  01/01/2023 Time:  8:17 PM  Group Topic/Focus:  Goals Group:   The focus of this group is to help patients establish daily goals to achieve during treatment and discuss how the patient can incorporate goal setting into their daily lives to aide in recovery.    Participation Level:  Did Not Attend  Participation Quality:   Did Not Attend  Affect:   Did Not Attend  Cognitive:   Did Not Attend  Insight: None  Engagement in Group:  None  Modes of Intervention:   Did Not Attend  Additional Comments:    Garry Heater 01/01/2023, 8:17 PM

## 2023-01-01 NOTE — Progress Notes (Signed)
Patient is in bed sleeping respirations noted, this shift no behavior problems. Q 15 minutes safety checks ongoing Patient remains safe.

## 2023-01-01 NOTE — Progress Notes (Signed)
Pt noted with increased anxiety, pacing and yelling in the hall. Redirected to her room, continues to scream, PRN agitation protocol given at 1729 with desired effect. Pt denies SI,HI, AVH and pain when assessed. Remains medication compliant without adverse drug reactions. Safety checks maintained at Q 15 minutes intervals. Pt continues to pace in hall without further outburst at this time.

## 2023-01-01 NOTE — Progress Notes (Signed)
[facility] MD Progress Note  Date: 01/01/2023 10:32 AM Name: Julia [name]  DOB: January 24, 2001 MRN: [id number] Unit: [id number]  CC: psychosis  REASON FOR ADMISSION   Julia [name] is a 22 y.o. female with a past psychiatric history significant for tobacco use disorder, social anxiety disorder, and schizoaffective disorder and who presents to the [facility] Voluntary from [facility] Emergency Department for evaluation and management of auditory and visual hallucinations.   Principal Problem: Schizoaffective disorder, depressive type (HCC) Diagnosis: Principal Problem:   Schizoaffective disorder, depressive type (HCC)  CHART REVIEW FROM LAST 24 HOURS   Adherent to scheduled meds: yes  Agitation PRNs: No Per nursing staff: no psychosis, however did have behavioral outbursts demanding IM ativan for anxiety, threatening to urinate, spit, hit if she doesn't get it. She was able to be verbally de-escalated Groups: engaged and attended all but 1 group Documented sleep last 24 hours: 9.25   SUBJECTIVE  Patient was initially seen asleep in bed this AM, easily awoken, no acute distress, tired. Patient was more engaged than yesterday, tired and somewhat child-like during evaluation.   Denied side effects to current medications. She is amenable to consolidating risperdal to all at night time for convenient.  Updated patient on plan of completing librium taper and tentative dispo on friday.   Mood: ("tired"), denied depression and anxiety. She denied benzodiazepine cravings and she minimized the outburst yesterday evening when she demanded ativan. Discussed gabapentin to help with craving, however she denied, saying that it doesn't help. She was on gabapentin 300 mg TID in the past and it was unhelpful.  Sleep:Fair Appetite: Good SI:No HI:No WUJ:WJXB  Review of Systems  Respiratory:  Negative for shortness of breath.   Cardiovascular:  Negative for chest pain.  Gastrointestinal:   Negative for abdominal pain, constipation, diarrhea, nausea and vomiting.  Musculoskeletal:  Negative for myalgias.  Neurological:  Negative for dizziness, tremors and headaches.       No restlessness   HISTORY  Past Psychiatric History:  Previous psych diagnoses:  Schizophrenia, bipolar Prior inpatient psychiatric treatment:  Multiple previous admissions Current/prior outpatient psychiatric treatment:  [facility] Current psychiatric provider:  don't know name   Neuromodulation history: denies   Current therapist: Denies Psychotherapy hx: Denies   History of suicide attempts:  once at age 36 History of homicide: Denies   Psychotropic medications: Current Risperidone 3 mg at bedtime - patient reportedly taking consistently, helps ease mind Fluoxetine 20 mg daily- patient reportedly taking consistently, helps quiet voices Clonazepam 0.5 mg BID - patient reportedly taking consistently, helps her anxiety   Past Denies other past trials   Allergies: NKA   Substance Use History: Alcohol: never drinks   --------   Tobacco: smokes, don't know how much Marijuana: denies Cocaine: denies Methamphetamines: denies Psilocybin: denies MDMA: denies Ecstasy: denies Opiates: denies Benzodiazepines: takes prescribed clonazepam IV drug use: denies Prescribed meds abuse: denies   History of detox: N/A History of rehab: N/A  Past Medical/Surgical History:  Medical Diagnoses: pre-diabetic Home Rx: denies Prior Hosp: hospitalized for seizures Prior Surgeries / non-head trauma: denies   Head trauma: denies LOC: denies Concussions: denies Seizures: endorses   Last menstrual period and contraceptives:  on period right now, not on any contraceptives   Family History:  Medical: denies Psych: denies Psych Rx: denies Suicide: denies Homicide: denies Substance use family hx: denies   Social History:  Place of birth and grew up where: born in [place], lived [place] Abuse:  no  history of abuse Marital Status: single Sexual orientation: straight Children: none Employment: unemployed Highest level of education: high school Housing: lives with [employer] (caretaker) Finances:  waiting on disability Legal: no Special educational needs teacher: never served Consulting civil engineer: denies Pills stockpile: denies  Past Medical History:  Past Medical History:  Diagnosis Date   Burning with urination 05/03/2015   Contraceptive management 05/03/2015   Depression    Dysmenorrhea 12/30/2013   Heroin addiction (HCC)    Menorrhagia 12/30/2013   Menstrual extraction 12/30/2013   Migraines    Schizophrenia (HCC)    Social anxiety disorder 09/25/2016   Suicidal ideations    Vaginal odor 05/03/2015    Past Surgical History:  Procedure Laterality Date   NO PAST SURGERIES     Family History:  Family History  Problem Relation Age of Onset   Depression Mother    Hypertension Father    Hyperlipidemia Father    Cancer Paternal Grandmother        breast, uterine   Cirrhosis Paternal Grandfather        due to alcohol   Social History:  Social History   Substance and Sexual Activity  Alcohol Use Not Currently   Comment: socially     Social History   Substance and Sexual Activity  Drug Use Not Currently   Types: Methamphetamines   Comment: last used 10/07/21    Social History   Socioeconomic History   Marital status: Single    Spouse name: Not on file   Number of children: Not on file   Years of education: Not on file   Highest education level: Not on file  Occupational History   Occupation: Unemployed  Tobacco Use   Smoking status: Former    Packs/day: 0.50    Years: 1.00    Additional pack years: 0.00    Total pack years: 0.50    Types: Cigarettes    Passive exposure: Past   Smokeless tobacco: Never  Vaping Use   Vaping Use: Never used  Substance and Sexual Activity   Alcohol use: Not Currently    Comment: socially   Drug use: Not Currently    Types:  Methamphetamines    Comment: last used 10/07/21   Sexual activity: Yes    Birth control/protection: Condom  Other Topics Concern   Not on file  Social History Narrative   Not on file   Social Determinants of Health   Financial Resource Strain: Not on file  Food Insecurity: No Food Insecurity (12/29/2022)   Hunger Vital Sign    Worried About Running Out of Food in the Last Year: Never true    Ran Out of Food in the Last Year: Never true  Transportation Needs: No Transportation Needs (12/29/2022)   PRAPARE - Administrator, Civil Service (Medical): No    Lack of Transportation (Non-Medical): No  Physical Activity: Not on file  Stress: Not on file  Social Connections: Not on file   Additional Social History:                         Current Medications: Current Facility-Administered Medications  Medication Dose Route Frequency Provider Last Rate Last Admin   hydrOXYzine (ATARAX) tablet 50 mg  50 mg Oral TID PRN Princess Bruins, DO       And   bismuth subsalicylate (PEPTO BISMOL) chewable tablet 524 mg  524 mg Oral Q3H PRN Princess Bruins, DO  And   senna (SENOKOT) tablet 8.6 mg  1 tablet Oral QHS PRN Princess Bruins, DO       And   polyethylene glycol (MIRALAX / GLYCOLAX) packet 17 g  17 g Oral Daily PRN Princess Bruins, DO       And   ondansetron [facility]) tablet 8 mg  8 mg Oral Q8H PRN Princess Bruins, DO       And   alum & mag hydroxide-simeth (MAALOX/MYLANTA) 200-200-20 MG/5ML suspension 30 mL  30 mL Oral Q4H PRN Princess Bruins, DO       chlordiazePOXIDE (LIBRIUM) capsule 25 mg  25 mg Oral BID Augusto Gamble, MD       Followed by   Melene Muller ON 01/02/2023] chlordiazePOXIDE (LIBRIUM) capsule 25 mg  25 mg Oral Once Augusto Gamble, MD       chlordiazePOXIDE (LIBRIUM) capsule 25 mg  25 mg Oral Q8H PRN Princess Bruins, DO       diphenhydrAMINE (BENADRYL) capsule 50 mg  50 mg Oral TID PRN Oneta Rack, NP       Or   diphenhydrAMINE (BENADRYL) injection 50 mg  50 mg  Intramuscular TID PRN Oneta Rack, NP   50 mg at 12/30/22 1752   FLUoxetine (PROZAC) capsule 20 mg  20 mg Oral Daily Oneta Rack, NP   20 mg at 01/01/23 1025   nicotine polacrilex (NICORETTE) gum 2 mg  2 mg Oral PRN Oneta Rack, NP   2 mg at 01/01/23 1028   OLANZapine zydis (ZYPREXA) disintegrating tablet 5 mg  5 mg Oral Q8H PRN Princess Bruins, DO       Or   OLANZapine (ZYPREXA) injection 5 mg  5 mg Intramuscular Q8H PRN Princess Bruins, DO       [START ON 01/02/2023] risperiDONE (RISPERDAL) tablet 3 mg  3 mg Oral QHS Princess Bruins, DO       traZODone (DESYREL) tablet 50 mg  50 mg Oral QHS PRN Oneta Rack, NP   50 mg at 12/29/22 2054    OBJECTIVE  BP 90/71 (BP Location: Left Arm)   Pulse 94   Temp 98.2 F (36.8 C) (Oral)   Resp 18   Ht 5\' 5"  (1.651 m)   Wt 78.9 kg   LMP 12/26/2022 (Exact Date)   SpO2 98%   BMI 28.96 kg/m   Physical Exam Physical Exam Vitals and nursing note reviewed.  Constitutional:      General: She is awake. She is not in acute distress.    Appearance: She is not ill-appearing or diaphoretic.  HENT:     Head: Normocephalic.  Pulmonary:     Effort: Pulmonary effort is normal. No respiratory distress.  Neurological:     Mental Status: She is alert.     AIMS:   ,  ,  ,  ,      Psychiatric Specialty Exam: Presentation General Appearance: Appropriate for Environment; Casual; Fairly Groomed  Eye Contact: Fair  Speech: Clear and Coherent; Normal Rate  Volume: Decreased  Handedness:Right   Mood and Affect  Mood: -- ("tired")  Affect: Appropriate; Congruent; Constricted   Thought Process  Thought Process: Goal Directed; Linear; Coherent  Descriptions of Associations: Intact   Thought Content Suicidal Thoughts:No  Homicidal Thoughts:No  Hallucinations:None  Ideas of Reference:None  Thought Content:Rumination   Sensorium  Memory:Immediate Fair  Judgment:Impaired  Insight:Shallow   Executive Functions   Orientation:Full (Time, Place and Person)  Language:Good  Concentration:Good  Attention:Good  Recall:Good  Fund of Knowledge:Good   Psychomotor Activity  Psychomotor Activity:Normal  Assets  Assets:Communication Skills; Desire for Improvement; Resilience; Physical Health  Sleep  Quality:Fair  Documented sleep last 24 hours: 9.25   Lab Results:  No results found for this or any previous visit (from the past 48 hour(s)).  Blood Alcohol level:  Lab Results  Component Value Date   ETH <10 12/28/2022   ETH <10 09/13/2022    Metabolic Disorder Labs: Lab Results  Component Value Date   HGBA1C 5.4 05/09/2020   MPG 108 05/09/2020   MPG 111.15 03/17/2020   Lab Results  Component Value Date   PROLACTIN 142.0 (H) 05/10/2020   PROLACTIN 113.0 (H) 11/11/2019   Lab Results  Component Value Date   CHOL 138 10/16/2019   TRIG 60 10/16/2019   HDL 36 (L) 10/16/2019   CHOLHDL 3.8 10/16/2019   VLDL 12 10/16/2019   LDLCALC 90 10/16/2019   LDLCALC 96 05/17/2019   ASSESSMENT / PLAN  Diagnoses / Active Problems: Principal Problem:   Schizoaffective disorder, depressive type (HCC)   Safety and Monitoring: Voluntary admission to inpatient psychiatric unit for safety, stabilization and treatment    2. Psychiatric Diagnoses and Treatment:   Schizoaffective d/o - bipolar type She was linear and not exhibiting any sxs of psychosis x2d. She does have negative sxs of flat affect x2d. Overnight RN also did not note any psychosis, however there was med seeking behavior with outburst. She no longer has PRN ativan to give, this was changed to librium on 6/18. Also having 3 time dosing of risperdal did not help the behavior concerns at all, so will consolidate to bedtime for her convenience. Continued home prozac 20 mg daily CHANGED home risperdal 1 mg TID to 3 mg qHS Antipsychotic monitoring labs ordered - A1c, lipid panel, TSH - was not obtained, reached out to RN to attempt blood  draw with patient  Chronic benzodiazepine use There is med seeking behavior where patient specifically asking for ativan after receiving librium, will change ativan PRN per below. She is craving considerably, however she declined gabapentin to help with anxiety and craving some, however she declined, saying it doesn't work. Still tolerating the librium taper well. Continued librium taper (6/17-6/20) Continued agitation PRN librium (changed from PRN ativan on 6/18)  3. Medical Issues Being Addressed:  CBC, CMP unremarkable Tylenol level <10, salicylate level <7, BAL <10  Serum HCG quant negative UDS+benzos QTc 449  4. Discharge Planning:  Tentative Date: Friday 6/21 Barrier: librium taper Location: home   Treatment Plan Summary: I certify that inpatient services furnished can reasonably be expected to improve the patient's condition.   Daily contact with patient to assess and evaluate symptoms and progress in treatment and Medication management Daily contact with patient to assess and evaluate symptoms and progress in treatment Patient's case to be discussed in multi-disciplinary team meeting Observation Level: q15 minute checks  Vital signs: q12 hours Precautions: suicide, elopement, and assault The risks/benefits/side-effects/alternatives to this medication were discussed in detail with the patient and time was given for questions. The patient consents to medication trial. The patient consents to medication trial. FDA black box warnings, if present, were discussed. Metabolic profile and EKG monitoring obtained while on an atypical antipsychotic  Encouraged patient to participate in unit milieu and in scheduled group therapies  Short Term Goals: Ability to identify changes in lifestyle to reduce recurrence of condition will improve, Ability to verbalize feelings will improve, Ability to disclose and discuss suicidal ideas,  Ability to demonstrate self-control will improve, Ability to  identify and develop effective coping behaviors will improve, Ability to maintain clinical measurements within normal limits will improve, Compliance with prescribed medications will improve, and Ability to identify triggers associated with substance abuse/mental health issues will improve Long Term Goals: Improvement in symptoms so as ready for discharge Social work and case management to assist with discharge planning and identification of hospital follow-up needs prior to discharge Estimated LOS: 5-7 days Discharge Concerns: Need to establish a safety plan; Medication compliance and effectiveness Discharge Goals: Return home with outpatient referrals for mental health follow-up including medication management/psychotherapy  Total Time spent with patient:  Patient's case was discussed with Attending, see attestation for more information.   Signed: Princess Bruins, DO Psychiatry Resident, PGY-2 [facility] - Adult  [address] [place], [place] [address] Ph: [phone] Fax: [phone]

## 2023-01-01 NOTE — Group Note (Signed)
Recreation Therapy Group Note   Group Topic:Self-Esteem  Group Date: 01/01/2023 Start Time: 1005 End Time: 1017 Facilitators: Bena Kobel-McCall, LRT,CTRS Location: [address] Day[address]   Goal Area(s) Addresses:  Patient will identify and write at least one positive trait about themself. Patient will successfully identify influential people in their life. Patient will acknowledge the benefit of healthy self-esteem. Patient will endorse understanding of ways to increase self-esteem.   Group Description: LRT began group session with open dialogue asking the patients to define self-esteem and verbally identify positive qualities and traits people may possess. Patients were then instructed to design a personalized license plate, with words and drawings, representing at least 3 positive things about themselves. Pts were encouraged to include favorites, things they are proud of, what they enjoy doing, and dreams for their future. If a patient had a life motto or a meaningful phase that expressed their life values, pt's were asked to incorporate that into their design as well. Patients were given the opportunity to share their completed work with the group.   Affect/Mood: N/A   Participation Level: Did not attend    Clinical Observations/Individualized Feedback:      Plan: Continue to engage patient in RT group sessions 2-3x/week.   Briana Newman-McCall, LRT,CTRS 01/01/2023 10:50 AM

## 2023-01-02 DIAGNOSIS — F251 Schizoaffective disorder, depressive type: Secondary | ICD-10-CM | POA: Diagnosis not present

## 2023-01-02 NOTE — Progress Notes (Signed)
Patient appears anxious. Patient denies SI/HI/AVH. Pt reports good sleep and appetite. Patient complied with morning medication with no reported side effects. Pt complained of right arm pain from getting blood drawn yesterday, states that she felt a pop in her arm that went all the way to her chest. Pt is asking for a sling and arm wrap. Pt BP was low this morning she denied feeling dizzy. Fluids encouraged. Patient remains safe on Q9min checks and contracts for safety.      01/02/23 0915  Psych Admission Type (Psych Patients Only)  Admission Status Voluntary  Psychosocial Assessment  Patient Complaints Anxiety;Irritability  Eye Contact Fair  Facial Expression Anxious  Affect Irritable  Speech Logical/coherent  Interaction Attention-seeking;Needy  Motor Activity Fidgety  Appearance/Hygiene Unremarkable  Behavior Characteristics Cooperative  Mood Labile;Anxious  Thought Process  Coherency WDL  Content Preoccupation  Delusions None reported or observed  Perception WDL  Hallucination None reported or observed  Judgment Impaired  Confusion None  Danger to Self  Current suicidal ideation? Denies  Agreement Not to Harm Self Yes  Description of Agreement verbal  Danger to Others  Danger to Others None reported or observed

## 2023-01-02 NOTE — [facility] Group Notes (Signed)
Pt did not attend group. 

## 2023-01-02 NOTE — Progress Notes (Signed)
Pt was given PRN medication due to agitation, stating she could here the police officers who brought her here. Pt denied the voices being flashbacks and stated they were presently here. Pt reported she could not see them.

## 2023-01-02 NOTE — Progress Notes (Signed)
[facility] MD Progress Note  Date: 01/02/2023 11:14 AM Name: Julia [name]  DOB: February 13, 2001 MRN: [id number] Unit: [id number]  CC: psychosis  REASON FOR ADMISSION   Julia [name] is a 22 y.o. female with a past psychiatric history significant for tobacco use disorder, social anxiety disorder, and schizoaffective disorder and who presents to the [facility] Voluntary from [facility] Emergency Department for evaluation and management of auditory and visual hallucinations.   Principal Problem: Schizoaffective disorder, depressive type (HCC) Diagnosis: Principal Problem:   Schizoaffective disorder, depressive type (HCC)  CHART REVIEW FROM LAST 24 HOURS   Adherent to scheduled meds: yes  Agitation PRNs: No Per nursing staff: no psychosis, however did have behavioral outbursts demanding IM ativan for anxiety, threatening to urinate, spit, hit if she doesn't get it. She was able to be verbally de-escalated Groups: engaged and attended all but 1 group Documented sleep last 24 hours: 9.5   SUBJECTIVE  Patient was initially seen asleep in bed this AM, easily awoken, no acute distress, tired. Patient was more engaged than yesterday, tired and somewhat child-like during evaluation.   Denied side effects to current medications. She is amenable to consolidating risperdal to all at night time for convenient.  Updated patient on plan of completing librium taper and tentative dispo on friday.   Mood:Anxious, denied depression and anxiety. She denied benzodiazepine cravings and plans to refuse future does of Librium. Sleep:Good Appetite: Good SI:No HI:No AVH:   Review of Systems  Respiratory:  Negative for shortness of breath.   Cardiovascular:  Negative for chest pain.  Gastrointestinal:  Negative for abdominal pain, constipation, diarrhea, nausea and vomiting.  Musculoskeletal:  Negative for myalgias.  Neurological:  Negative for dizziness, tremors and headaches.       No  restlessness   HISTORY  Past Psychiatric History:  Previous psych diagnoses:  Schizophrenia, bipolar Prior inpatient psychiatric treatment:  Multiple previous admissions Current/prior outpatient psychiatric treatment:  [facility] Current psychiatric provider:  don't know name   Neuromodulation history: denies   Current therapist: Denies Psychotherapy hx: Denies   History of suicide attempts:  once at age 41 History of homicide: Denies   Psychotropic medications: Current Risperidone 3 mg at bedtime - patient reportedly taking consistently, helps ease mind Fluoxetine 20 mg daily- patient reportedly taking consistently, helps quiet voices Clonazepam 0.5 mg BID - patient reportedly taking consistently, helps her anxiety   Past Denies other past trials   Allergies: NKA   Substance Use History: Alcohol: never drinks   --------   Tobacco: smokes, don't know how much Marijuana: denies Cocaine: denies Methamphetamines: denies Psilocybin: denies MDMA: denies Ecstasy: denies Opiates: denies Benzodiazepines: takes prescribed clonazepam IV drug use: denies Prescribed meds abuse: denies   History of detox: N/A History of rehab: N/A  Past Medical/Surgical History:  Medical Diagnoses: pre-diabetic Home Rx: denies Prior Hosp: hospitalized for seizures Prior Surgeries / non-head trauma: denies   Head trauma: denies LOC: denies Concussions: denies Seizures: endorses   Last menstrual period and contraceptives:  on period right now, not on any contraceptives   Family History:  Medical: denies Psych: denies Psych Rx: denies Suicide: denies Homicide: denies Substance use family hx: denies   Social History:  Place of birth and grew up where: born in [place], lived [place] Abuse: no history of abuse Marital Status: single Sexual orientation: straight Children: none Employment: unemployed Highest level of education: high school Housing: lives with [place]  (caretaker) Finances:  waiting on disability Legal: no  legal issues Military: never served Consulting civil engineer: denies Pills stockpile: denies  Past Medical History:  Past Medical History:  Diagnosis Date   Burning with urination 05/03/2015   Contraceptive management 05/03/2015   Depression    Dysmenorrhea 12/30/2013   Heroin addiction (HCC)    Menorrhagia 12/30/2013   Menstrual extraction 12/30/2013   Migraines    Schizophrenia (HCC)    Social anxiety disorder 09/25/2016   Suicidal ideations    Vaginal odor 05/03/2015    Past Surgical History:  Procedure Laterality Date   NO PAST SURGERIES     Family History:  Family History  Problem Relation Age of Onset   Depression Mother    Hypertension Father    Hyperlipidemia Father    Cancer Paternal Grandmother        breast, uterine   Cirrhosis Paternal Grandfather        due to alcohol   Social History:  Social History   Substance and Sexual Activity  Alcohol Use Not Currently   Comment: socially     Social History   Substance and Sexual Activity  Drug Use Not Currently   Types: Methamphetamines   Comment: last used 10/07/21    Social History   Socioeconomic History   Marital status: Single    Spouse name: Not on file   Number of children: Not on file   Years of education: Not on file   Highest education level: Not on file  Occupational History   Occupation: Unemployed  Tobacco Use   Smoking status: Former    Packs/day: 0.50    Years: 1.00    Additional pack years: 0.00    Total pack years: 0.50    Types: Cigarettes    Passive exposure: Past   Smokeless tobacco: Never  Vaping Use   Vaping Use: Never used  Substance and Sexual Activity   Alcohol use: Not Currently    Comment: socially   Drug use: Not Currently    Types: Methamphetamines    Comment: last used 10/07/21   Sexual activity: Yes    Birth control/protection: Condom  Other Topics Concern   Not on file  Social History Narrative   Not on file    Social Determinants of Health   Financial Resource Strain: Not on file  Food Insecurity: No Food Insecurity (12/29/2022)   Hunger Vital Sign    Worried About Running Out of Food in the Last Year: Never true    Ran Out of Food in the Last Year: Never true  Transportation Needs: No Transportation Needs (12/29/2022)   PRAPARE - Administrator, Civil Service (Medical): No    Lack of Transportation (Non-Medical): No  Physical Activity: Not on file  Stress: Not on file  Social Connections: Not on file   Additional Social History:                         Current Medications: Current Facility-Administered Medications  Medication Dose Route Frequency Provider Last Rate Last Admin   alum & mag hydroxide-simeth (MAALOX/MYLANTA) 200-200-20 MG/5ML suspension 30 mL  30 mL Oral Q4H PRN Princess Bruins, DO       chlordiazePOXIDE (LIBRIUM) capsule 25 mg  25 mg Oral Once Augusto Gamble, MD       chlordiazePOXIDE (LIBRIUM) capsule 25 mg  25 mg Oral Q8H PRN Princess Bruins, DO       diphenhydrAMINE (BENADRYL) capsule 50 mg  50 mg Oral TID PRN Oneta Rack,  NP   50 mg at 01/01/23 1729   Or   diphenhydrAMINE (BENADRYL) injection 50 mg  50 mg Intramuscular TID PRN Oneta Rack, NP   50 mg at 12/30/22 1752   FLUoxetine (PROZAC) capsule 20 mg  20 mg Oral Daily Oneta Rack, NP   20 mg at 01/02/23 4098   hydrOXYzine (ATARAX) tablet 25 mg  25 mg Oral Q6H PRN Neville Route L, DO       loperamide (IMODIUM) capsule 2-4 mg  2-4 mg Oral PRN Neville Route L, DO       nicotine polacrilex (NICORETTE) gum 2 mg  2 mg Oral PRN Oneta Rack, NP   2 mg at 01/02/23 1055   OLANZapine zydis (ZYPREXA) disintegrating tablet 5 mg  5 mg Oral Q8H PRN Princess Bruins, DO   5 mg at 01/02/23 1112   Or   OLANZapine (ZYPREXA) injection 5 mg  5 mg Intramuscular Q8H PRN Princess Bruins, DO       ondansetron (ZOFRAN-ODT) disintegrating tablet 4 mg  4 mg Oral Q6H PRN Neville Route L, DO        risperiDONE (RISPERDAL) tablet 3 mg  3 mg Oral QHS Princess Bruins, DO       traZODone (DESYREL) tablet 50 mg  50 mg Oral QHS PRN Oneta Rack, NP   50 mg at 12/29/22 2054    OBJECTIVE  BP 93/68 (BP Location: Left Arm)   Pulse 81   Temp 98 F (36.7 C) (Oral)   Resp 16   Ht 5\' 5"  (1.651 m)   Wt 78.9 kg   LMP 12/26/2022 (Exact Date)   SpO2 99%   BMI 28.96 kg/m   Physical Exam Physical Exam Vitals and nursing note reviewed.  Constitutional:      General: She is awake. She is not in acute distress.    Appearance: She is not ill-appearing or diaphoretic.  HENT:     Head: Normocephalic.  Pulmonary:     Effort: Pulmonary effort is normal. No respiratory distress.  Neurological:     Mental Status: She is alert.     AIMS:Dental Status Current problems with teeth and/or dentures?: No Does patient usually wear dentures?: No Facial and Oral Movements Muscles of Facial Expression: None, normal Lips and Perioral Area: None, normal Jaw: None, normal Tongue: None, normal, Extremity Movements Upper (arms, wrists, hands, fingers): None, normal Lower (legs, knees, ankles, toes): None, normal, Trunk Movements Neck, shoulders, hips: None, normal, Overall Severity Severity of abnormal movements (highest score from questions above): None, normal Incapacitation due to abnormal movements: None, normal Patient's awareness of abnormal movements (rate only patient's report): No Awareness, Dental Status Current problems with teeth and/or dentures?: No Does patient usually wear dentures?: No    Psychiatric Specialty Exam: Presentation General Appearance: Appropriate for Environment  Eye Contact: Good  Speech: Normal Rate  Volume: Decreased  Handedness:Right   Mood and Affect  Mood: Anxious  Affect: Appropriate   Thought Process  Thought Process: Linear  Descriptions of Associations: Intact   Thought Content Suicidal Thoughts:No  Homicidal  Thoughts:No  Hallucinations:None  Ideas of Reference:None  Thought Content:Rumination   Sensorium  Memory:Recent Good; Remote Good  Judgment:Fair  Insight:Fair   Executive Functions  Orientation:Full (Time, Place and Person)  Language:Good  Concentration:Good  Attention:Good  Recall:Good  Fund of Knowledge:Fair   Psychomotor Activity  Psychomotor Activity:Normal  Assets  Assets:Communication Skills; Desire for Improvement; Resilience; Physical Health  Sleep  Quality:Good  Documented sleep last  24 hours: 9.5   Lab Results:  No results found for this or any previous visit (from the past 48 hour(s)).  Blood Alcohol level:  Lab Results  Component Value Date   ETH <10 12/28/2022   ETH <10 09/13/2022    Metabolic Disorder Labs: Lab Results  Component Value Date   HGBA1C 5.4 05/09/2020   MPG 108 05/09/2020   MPG 111.15 03/17/2020   Lab Results  Component Value Date   PROLACTIN 142.0 (H) 05/10/2020   PROLACTIN 113.0 (H) 11/11/2019   Lab Results  Component Value Date   CHOL 138 10/16/2019   TRIG 60 10/16/2019   HDL 36 (L) 10/16/2019   CHOLHDL 3.8 10/16/2019   VLDL 12 10/16/2019   LDLCALC 90 10/16/2019   LDLCALC 96 05/17/2019   ASSESSMENT / PLAN  Diagnoses / Active Problems: Principal Problem:   Schizoaffective disorder, depressive type (HCC)   Safety and Monitoring: Voluntary admission to inpatient psychiatric unit for safety, stabilization and treatment    2. Psychiatric Diagnoses and Treatment:   Schizoaffective d/o - bipolar type She was linear and not exhibiting any sxs of psychosis x2d. She does have negative sxs of flat affect x2d. Overnight RN also did not note any psychosis, however there was med seeking behavior with outburst. She no longer has PRN ativan to give, this was changed to librium on 6/18. Also having 3 time dosing of risperdal did not help the behavior concerns at all, so will consolidate to bedtime for her  convenience. Continued home prozac 20 mg daily CHANGED home risperdal 1 mg TID to 3 mg qHS Antipsychotic monitoring labs ordered - A1c, lipid panel, TSH - was not obtained, reached out to RN to attempt blood draw with patient  Chronic benzodiazepine use There is med seeking behavior where patient specifically asking for ativan after receiving librium, will change ativan PRN per below. She is craving considerably, however she declined gabapentin to help with anxiety and craving some, however she declined, saying it doesn't work. Still tolerating the librium taper well. Continued librium taper (6/17-6/20) Continued agitation PRN librium (changed from PRN ativan on 6/18)  3. Medical Issues Being Addressed:  CBC, CMP unremarkable Tylenol level <10, salicylate level <7, BAL <10  Serum HCG quant negative UDS+benzos QTc 449  4. Discharge Planning:  Tentative Date: Friday 6/21 Barrier: librium taper Location: home   Treatment Plan Summary: I certify that inpatient services furnished can reasonably be expected to improve the patient's condition.   Daily contact with patient to assess and evaluate symptoms and progress in treatment and Medication management Daily contact with patient to assess and evaluate symptoms and progress in treatment Patient's case to be discussed in multi-disciplinary team meeting Observation Level: q15 minute checks  Vital signs: q12 hours Precautions: suicide, elopement, and assault The risks/benefits/side-effects/alternatives to this medication were discussed in detail with the patient and time was given for questions. The patient consents to medication trial. The patient consents to medication trial. FDA black box warnings, if present, were discussed. Metabolic profile and EKG monitoring obtained while on an atypical antipsychotic  Encouraged patient to participate in unit milieu and in scheduled group therapies  Short Term Goals: Ability to identify changes in  lifestyle to reduce recurrence of condition will improve, Ability to verbalize feelings will improve, Ability to disclose and discuss suicidal ideas, Ability to demonstrate self-control will improve, Ability to identify and develop effective coping behaviors will improve, Ability to maintain clinical measurements within normal limits will improve, Compliance with  prescribed medications will improve, and Ability to identify triggers associated with substance abuse/mental health issues will improve Long Term Goals: Improvement in symptoms so as ready for discharge Social work and case management to assist with discharge planning and identification of hospital follow-up needs prior to discharge Estimated LOS: 5-7 days Discharge Concerns: Need to establish a safety plan; Medication compliance and effectiveness Discharge Goals: Return home with outpatient referrals for mental health follow-up including medication management/psychotherapy  Total Time spent with patient:  20 minutes  Signed: Harlin Heys, DO [facility]  [address] [place], [place] [address] Ph: [phone] Fax: [phone]

## 2023-01-02 NOTE — Progress Notes (Signed)
Patient attended group, patient came up to the medication window for her medication. Patient received her scheduled Risperidone 3 mg and PRN Nicotine without side effects. Patient refused Trazodone when offered. Patient denies SI, HI, AVH, and pain. Patient went back to her room, patient yelled  and screamed out  while in bed. Nurse went to have a word with patient, patient stated "I'm okay, nothing happened, I just wanted to scream, leave me alone."  Patient declined agitation medication at this time. Notified AC of patient's behavior,  to contact the provider for Do Not Admit order per [facility].

## 2023-01-02 NOTE — Group Note (Signed)
Recreation Therapy Group Note   Group Topic:Leisure Education  Group Date: 01/02/2023 Start Time: 1000 End Time: 1040 Facilitators: Shira Bobst-McCall, LRT,CTRS Location: [address] Dayroom   Goal Area(s) Addresses:  Patient will identify positive leisure activities.  Patient will identify one positive benefit of participation in leisure activities.   Group Description: Music Trivia. Patient, MHT, and LRT participated in playing a trivia game of Lyrically Correct (song lyrics from 90s and 2000s). LRT debriefed on what leisure is, what examples of leisure activities are, where you can participate in leisure and why leisure is important.    Affect/Mood: Appropriate   Participation Level: Active   Participation Quality: Independent   Behavior: Appropriate   Speech/Thought Process: Focused   Insight: Moderate   Judgement: Moderate   Modes of Intervention: Cooperative Play   Patient Response to Interventions:  Engaged   Education Outcome:  Acknowledges education   Clinical Observations/Individualized Feedback: Pt was appropriate and broke a smile a few times during group session. Pt made attempts to answer the questions presented. Pt did leave group early and did not return.     Plan: Continue to engage patient in RT group sessions 2-3x/week.   Yoland Scherr-McCall, LRT,CTRS 01/02/2023 11:43 AM

## 2023-01-02 NOTE — Plan of Care (Signed)
  Problem: Activity: Goal: Will verbalize the importance of balancing activity with adequate rest periods Outcome: Progressing   Problem: Education: Goal: Will be free of psychotic symptoms Outcome: Progressing Goal: Knowledge of the prescribed therapeutic regimen will improve Outcome: Progressing   Problem: Coping: Goal: Coping ability will improve Outcome: Progressing Goal: Will verbalize feelings Outcome: Progressing   Problem: Health Behavior/Discharge Planning: Goal: Compliance with prescribed medication regimen will improve Outcome: Progressing   Problem: Nutritional: Goal: Ability to achieve adequate nutritional intake will improve Outcome: Progressing   Problem: Role Relationship: Goal: Ability to communicate needs accurately will improve Outcome: Progressing Goal: Ability to interact with others will improve Outcome: Progressing   Problem: Safety: Goal: Ability to redirect hostility and anger into socially appropriate behaviors will improve Outcome: Progressing Goal: Ability to remain free from injury will improve Outcome: Progressing   Problem: Self-Care: Goal: Ability to participate in self-care as condition permits will improve Outcome: Progressing   Problem: Self-Concept: Goal: Will verbalize positive feelings about self Outcome: Progressing   Problem: Education: Goal: Knowledge of [facility] Education information/materials will improve Outcome: Progressing Goal: Emotional status will improve Outcome: Progressing Goal: Mental status will improve Outcome: Progressing Goal: Verbalization of understanding the information provided will improve Outcome: Progressing   Problem: Activity: Goal: Interest or engagement in activities will improve Outcome: Progressing Goal: Sleeping patterns will improve Outcome: Progressing   Problem: Coping: Goal: Ability to verbalize frustrations and anger appropriately will improve Outcome:  Progressing Goal: Ability to demonstrate self-control will improve Outcome: Progressing   Problem: Health Behavior/Discharge Planning: Goal: Identification of resources available to assist in meeting health care needs will improve Outcome: Progressing Goal: Compliance with treatment plan for underlying cause of condition will improve Outcome: Progressing   Problem: Physical Regulation: Goal: Ability to maintain clinical measurements within normal limits will improve Outcome: Progressing   Problem: Safety: Goal: Periods of time without injury will increase Outcome: Progressing   

## 2023-01-02 NOTE — Plan of Care (Signed)
  Problem: Activity: Goal: Will verbalize the importance of balancing activity with adequate rest periods Outcome: Progressing   Problem: Safety: Goal: Ability to redirect hostility and anger into socially appropriate behaviors will improve Outcome: Progressing   Problem: Education: Goal: Mental status will improve Outcome: Progressing

## 2023-01-02 NOTE — Progress Notes (Signed)
   01/02/23 2100  Psych Admission Type (Psych Patients Only)  Admission Status Voluntary  Psychosocial Assessment  Patient Complaints Anxiety;Irritability  Eye Contact Fair  Facial Expression Anxious  Affect Irritable  Speech Logical/coherent  Interaction Attention-seeking;Needy  Motor Activity Fidgety  Appearance/Hygiene Unremarkable  Behavior Characteristics Appropriate to situation  Mood Labile;Anxious  Thought Process  Coherency WDL  Content Preoccupation  Delusions None reported or observed  Perception WDL  Hallucination None reported or observed  Judgment Impaired  Confusion None  Danger to Self  Current suicidal ideation? Denies  Agreement Not to Harm Self Yes  Description of Agreement verbal  Danger to Others  Danger to Others None reported or observed

## 2023-01-03 MED ORDER — RISPERIDONE 3 MG PO TABS: 3.0000 mg | ORAL_TABLET | Freq: Every day | ORAL | 0 refills | Status: DC

## 2023-01-03 MED ORDER — FLUOXETINE HCL 20 MG PO CAPS: 20.0000 mg | ORAL_CAPSULE | Freq: Every day | ORAL | 0 refills | Status: DC

## 2023-01-03 MED ORDER — NICOTINE POLACRILEX 4 MG MT GUM: 4.0000 mg | CHEWING_GUM | OROMUCOSAL | 0 refills | Status: DC | PRN

## 2023-01-03 NOTE — Progress Notes (Signed)
  [facility] Adult Case Management Discharge Plan :  Will you be returning to the same living situation after discharge:  Yes,  back with caretaker  At discharge, do you have transportation home?: Yes,  Julia [name], caretaker will pick patient up Do you have the ability to pay for your medications: Yes,  insurance  Release of information consent forms completed and in the chart;  Patient's signature needed at discharge.  Patient to Follow up at:  Follow-up Information     Services, [facility]. Go on 01/06/2023.   Why: You have a hospital follow up appointment for group or individual therapy and/or necessary medication management services on 01/06/23 at 10:00 am.  The appointment will be held in person. Contact information: [address] [place] [address] [phone]         Julia Jewels, MD .   Specialty: Pediatrics Why: You have an appointment with you primary care provider for medication management services Contact information: [address] [place] [address] [phone]         [facility]. Schedule an appointment as soon as possible for a visit.   Why: Please call this provider to schedule an appointment for bereavement/grief therapy services, as we were unable to contact prior to discharge. Contact information: [address], [place], [place] [address]  Phone: [phone]                Next level of care provider has access to [name] Link:no  Safety Planning and Suicide Prevention discussed: Yes,  Julia [name]     Has patient been referred to the Quitline?: Patient refused referral for treatment  Patient has been referred for addiction treatment: No known substance use disorder.  Julia [name] E Julia Corea, LCSW 01/03/2023, 10:17 AM

## 2023-01-03 NOTE — Group Note (Signed)
Date:  01/03/2023 Time:  10:18 AM  Group Topic/Focus:  Goals Group:   The focus of this group is to help patients establish daily goals to achieve during treatment and discuss how the patient can incorporate goal setting into their daily lives to aide in recovery.    Participation Level:  Did Not Attend  Participation Quality:      Affect:      Cognitive:      Insight: None  Engagement in Group:     Modes of Intervention:      Additional Comments:     Reymundo Poll 01/03/2023, 10:18 AM

## 2023-01-03 NOTE — Progress Notes (Signed)
   01/03/23 0528  15 Minute Checks  Location Bedroom  Visual Appearance Calm  Behavior Sleeping  Sleep (Behavioral Health Patients Only)  Calculate sleep? (Click Yes once per 24 hr at 0600 safety check) Yes  Documented sleep last 24 hours 10.75

## 2023-01-03 NOTE — [facility] Suicide Risk Assessment (Signed)
[facility] Discharge Suicide Risk Assessment  Principal Problem: Schizoaffective disorder, depressive type [facility]) Discharge Diagnoses: Principal Problem:   Schizoaffective disorder, depressive type (HCC)   Reason for admission:  Julia [name] is a 22 y.o. female with a past psychiatric history significant for tobacco use disorder, social anxiety disorder, and schizoaffective disorder and who presents to the [facility] Voluntary from [facility] Emergency Department for evaluation and management of auditory and visual hallucinations.   PTA Medications:  Prozac 20 mg daily, risperdal 3 mg at bedtime, klonopin 0.5 mg daily PRN  Hospital Course:   During the patient's hospitalization, patient had extensive initial psychiatric evaluation, and follow-up psychiatric evaluations every day.  Psychiatric diagnoses provided upon initial assessment:  Schizoaffective disorder, depressive type   Patient's psychiatric medications were adjusted on admission:              -- Start risperidone 1.5 mg twice a day for psychotic symptoms and mood stabilization             -- Start chlordiazepoxide 25 mg 4 times daily, then 25 mg 3 times daily, then 25 mg twice daily, then 25 mg once, then discontinue altogether             -- Continue fluoxetine 20 mg daily for depressive symptoms  During the hospitalization, other adjustments were made to the patient's psychiatric medication regimen:  Continued home prozac 20 mg daily CHANGED home risperdal to 3 mg qHS  During the hospitalization lab/imaging obtained: CBC, CMP unremarkable Tylenol level <10, salicylate level <7, BAL <10  Serum HCG quant negative UDS+benzos QTc 449  Patient's care was discussed during the interdisciplinary team meeting every day during the hospitalization.  Patient's side effects to prescribed psychiatric medications: NA  Assessment  Gradually, patient started adjusting to milieu. The patient was evaluated each day by a  clinical provider to ascertain response to treatment. Improvement was noted by the patient's report of decreasing symptoms, improved sleep and appetite, affect, medication tolerance, behavior, and participation in unit programming.  Patient was asked each day to complete a self inventory noting mood, mental status, pain, new symptoms, anxiety and concerns.    Symptoms were reported as significantly decreased or resolved completely by discharge.   On day of discharge, the patient reports that their mood is stable. The patient denied having suicidal thoughts for more than 48 hours prior to discharge.  Patient denies having homicidal thoughts.  Patient denies having auditory hallucinations.  Patient denies any visual hallucinations or other symptoms of psychosis. The patient was motivated to continue taking medication with a goal of continued improvement in mental health.   The patient reports their target psychiatric symptoms of psychosis responded well to the psychiatric medications, and the patient reports overall benefit other psychiatric hospitalization. Supportive psychotherapy was provided to the patient. The patient also participated in regular group therapy while hospitalized. Coping skills, problem solving as well as relaxation therapies were also part of the unit programming.  Labs were reviewed with the patient, and abnormal results were discussed with the patient.  The patient is able to verbalize their individual safety plan to this provider.  Behavioral Events: Multiple, in the evening patient would have behavioral outburst demanding ativan, and would verbally threaten staff to get them. She would also scream in her room.   Restraints: NA  Groups: Attends a few of the groups    Medication List    CHANGE how you take these medications    nicotine polacrilex  4 MG gum; Commonly known as: NICORETTE; Take 1 each  (4 mg total) by mouth as needed for smoking cessation.; What changed: how   much to take, when to take this, reasons to take this   CONTINUE taking these medications    FLUoxetine 20 MG capsule; Commonly known as: PROZAC; Take 1 capsule (20  mg total) by mouth daily.  risperiDONE 3 MG tablet; Commonly known as: RISPERDAL; Take 1 tablet (3  mg total) by mouth at bedtime.   STOP taking these medications    clonazePAM 0.5 MG tablet; Commonly known as: KLONOPIN     Musculoskeletal: Strength & Muscle Tone: within normal limits Gait & Station: normal Patient leans: N/A   Presentation  General Appearance:Appropriate for Environment, Casual, Fairly Groomed Eye Contact:Fair Speech:Clear and Coherent, Normal Rate Volume:Decreased Handedness:Right  Mood and Affect  Mood: ("tired") Affect:Appropriate, Congruent, Constricted  Thought Process  Thought Process:Goal Directed, Linear, Coherent Descriptions of Associations:Intact  Thought Content Suicidal Thoughts:Suicidal Thoughts: No Homicidal Thoughts:Homicidal Thoughts: No Hallucinations:Hallucinations: None Ideas of Reference:None Thought Content:Rumination  Sensorium  Memory:Immediate Fair Judgment:Impaired Insight:Shallow  Executive Functions  Orientation:Full (Time, Place and Person) Language:Good Concentration:Good Attention:Good Recall:Good Fund of Knowledge:Good  Psychomotor Activity  Psychomotor Activity:Psychomotor Activity: Normal  Assets  Assets:Communication Skills, Desire for Improvement, Resilience, Physical Health  Sleep  Quality:Fair  Physical Exam Vitals and nursing note reviewed.  Constitutional:      General: She is awake. She is not in acute distress.    Appearance: She is not ill-appearing or diaphoretic.  HENT:     Head: Normocephalic.  Pulmonary:     Effort: Pulmonary effort is normal. No respiratory distress.  Neurological:     Mental Status: She is alert.     Review of Systems  Respiratory:  Negative for shortness of breath.   Cardiovascular:  Negative  for chest pain.  Gastrointestinal:  Negative for nausea and vomiting.  Neurological:  Negative for dizziness and headaches.     Blood pressure 107/73, pulse 81, temperature 98 F (36.7 C), temperature source Oral, resp. rate 16, height 5\' 5"  (1.651 m), weight 78.9 kg, last menstrual period 12/26/2022, SpO2 99 %. Body mass index is 28.96 kg/m.  Mental Status Per Nursing Assessment::   On Admission:  NA  Demographic Factors: Caucasian, Low socioeconomic status, Adolescent or young adult  Loss Factors: Decrease in vocational status  Historical Factors: Impulsivity, Prior suicide attempts  Risk Reduction Factors:   Living with another person, especially a relative, Positive social support, Positive therapeutic relationship, and Positive coping skills or problem solving skills  Continued Clinical Symptoms:  Previous Psychiatric Diagnoses and Treatments  Cognitive Features That Contribute To Risk:  Loss of executive function    Suicide Risk:  Mild: There are no identifiable suicide plans, no associated intent, mild dysphoria and related symptoms, good self-control (both objective and subjective assessment), few other risk factors, and identifiable protective factors, including available and accessible social support    Follow-up Information     Services, [facility]. Go on 01/06/2023.   Why: You have a hospital follow up appointment for group or individual therapy and/or necessary medication management services on 01/06/23 at 10:00 am.  The appointment will be held in person. Contact information: [address] [place] [address] [phone]         [name], MD .   Specialty: Pediatrics Why: You have an appointment with you primary care provider for medication management services Contact information: [address] [facility]  [place] [address] [phone]         [facility]. Schedule an appointment as soon as possible for a visit.    Why: Please call this provider to schedule an appointment for bereavement/grief therapy services, as we were unable to contact prior to discharge. Contact information: [address], [place], [place] [address]  Phone: [phone]                Discharge recommendations:    # It is recommended to the patient to continue psychiatric medications as prescribed, after discharge from the hospital.     # It is recommended to the patient to follow up with your outpatient psychiatric provider -instructions on appointment date, time, and address (location) are provided to you in discharge paperwork  # Follow-up with outpatient primary care doctor and other specialists -for management of chronic medical disease, including:  Health Maintenance   # Testing: Follow-up with outpatient provider for abnormal lab results:  See above   # It was discussed with the patient, the impact of alcohol, drugs, tobacco have been there overall psychiatric and medical wellbeing, and total abstinence from substance use was recommended to the patient.   # Prescriptions provided or sent directly to preferred pharmacy at discharge. Patient agreeable to plan. Given opportunity to ask questions. Appears to feel comfortable with discharge.    # In the event of worsening symptoms, the patient is instructed to call the crisis hotline, 911 and or go to the nearest ED for appropriate evaluation and treatment of symptoms. To follow-up with primary care provider for other medical issues, concerns and or health care needs  Patient agrees with D/C instructions and plan.   Total Time Spent in Direct Patient Care: See attending attestation.  Signed: Princess Bruins, DO Psychiatry Resident, PGY-2 [facility] - Adult  [address] [place], [place] [address] Ph: [phone] Fax: [phone] 01/03/2023, 8:37 AM

## 2023-01-03 NOTE — Progress Notes (Signed)
Pt discharged at this time. Pt removed all belongings and verbalized understanding of medications and discharge instructions. Pt denies SI/HI/AVH. Pt left facility with friend.

## 2023-01-03 NOTE — Plan of Care (Signed)
Rochelle,Aide (Other) - patient's caretaker [phone] [facility])  Contacted caretaker to discuss treatment plan and discharge medications. Her main concern was patient's benzodiazepine that she was on prior to admission. Discussed that patient has been tapered off of the benzodiazepine safely. She had no other questions or concerns.  Princess Bruins, DO Psych Resident, PGY-2

## 2023-01-03 NOTE — Hospital Course (Signed)
Hospital Course:   During the patient's hospitalization, patient had extensive initial psychiatric evaluation, and follow-up psychiatric evaluations every day.  Psychiatric diagnoses provided upon initial assessment:  Schizoaffective disorder, depressive type   Patient's psychiatric medications were adjusted on admission:              -- Start risperidone 1.5 mg twice a day for psychotic symptoms and mood stabilization             -- Start chlordiazepoxide 25 mg 4 times daily, then 25 mg 3 times daily, then 25 mg twice daily, then 25 mg once, then discontinue altogether             -- Continue fluoxetine 20 mg daily for depressive symptoms  During the hospitalization, other adjustments were made to the patient's psychiatric medication regimen:  Continued home prozac 20 mg daily CHANGED home risperdal to 3 mg qHS  During the hospitalization lab/imaging obtained: CBC, CMP unremarkable Tylenol level <10, salicylate level <7, BAL <10  Serum HCG quant negative UDS+benzos QTc 449  Patient's care was discussed during the interdisciplinary team meeting every day during the hospitalization.  Patient's side effects to prescribed psychiatric medications: NA  Assessment  Gradually, patient started adjusting to milieu. The patient was evaluated each day by a clinical provider to ascertain response to treatment. Improvement was noted by the patient's report of decreasing symptoms, improved sleep and appetite, affect, medication tolerance, behavior, and participation in unit programming.  Patient was asked each day to complete a self inventory noting mood, mental status, pain, new symptoms, anxiety and concerns.    Symptoms were reported as significantly decreased or resolved completely by discharge.   On day of discharge, the patient reports that their mood is stable. The patient denied having suicidal thoughts for more than 48 hours prior to discharge.  Patient denies having homicidal thoughts.   Patient denies having auditory hallucinations.  Patient denies any visual hallucinations or other symptoms of psychosis. The patient was motivated to continue taking medication with a goal of continued improvement in mental health.   The patient reports their target psychiatric symptoms of psychosis responded well to the psychiatric medications, and the patient reports overall benefit other psychiatric hospitalization. Supportive psychotherapy was provided to the patient. The patient also participated in regular group therapy while hospitalized. Coping skills, problem solving as well as relaxation therapies were also part of the unit programming.  Labs were reviewed with the patient, and abnormal results were discussed with the patient.  The patient is able to verbalize their individual safety plan to this provider.  Behavioral Events: Multiple, in the evening patient would have behavioral outburst demanding ativan, and would verbally threaten staff to get them. She would also scream in her room.   Restraints: NA  Groups: Attends a few of the groups

## 2023-01-03 NOTE — Discharge Summary (Signed)
Physician Discharge Summary Note  Patient Identification: Julia [name], 22 y.o. female  MRN: [id number] DOB: August 19, 2000  Date of Evaluation: 01/03/2023, 8:41 AM Bed: 0304/0304-02 Patient phone: [phone] (home)  Patient address:   [address] [place] [address]  Date of Admission: 12/29/2022 Date of Discharge: 01/03/2023  Reason for Admission:   Julia [name] is a 22 y.o. female with a past psychiatric history significant for tobacco use disorder, social anxiety disorder, and schizoaffective disorder and who presents to the [facility] Voluntary from [facility] Emergency Department for evaluation and management of auditory and visual hallucinations.   Past Psychiatric History:  Previous psych diagnoses:  Schizophrenia, bipolar Prior inpatient psychiatric treatment:  Multiple previous admissions Current/prior outpatient psychiatric treatment:  [facility] Current psychiatric provider:  don't know name   Neuromodulation history: denies   Current therapist: Denies Psychotherapy hx: Denies   History of suicide attempts:  once at age 46 History of homicide: Denies   Psychotropic medications: Current Risperidone 3 mg at bedtime - patient reportedly taking consistently, helps ease mind Fluoxetine 20 mg daily- patient reportedly taking consistently, helps quiet voices Clonazepam 0.5 mg BID - patient reportedly taking consistently, helps her anxiety   Past Denies other past trials   Allergies: NKA   Substance Use History: Alcohol: never drinks   --------   Tobacco: smokes, don't know how much Marijuana: denies Cocaine: denies Methamphetamines: denies Psilocybin: denies MDMA: denies Ecstasy: denies Opiates: denies Benzodiazepines: takes prescribed clonazepam IV drug use: denies Prescribed meds abuse: denies   History of detox: N/A History of rehab: N/A   Past Medical/Surgical History:  Medical Diagnoses: pre-diabetic Home Rx: denies Prior Hosp:  hospitalized for seizures Prior Surgeries / non-head trauma: denies   Head trauma: denies LOC: denies Concussions: denies Seizures: endorses   Last menstrual period and contraceptives:  on period right now, not on any contraceptives   Family History:  Medical: denies Psych: denies Psych Rx: denies Suicide: denies Homicide: denies Substance use family hx: denies   Social History:  Place of birth and grew up where: born in [place], lived [place] Abuse: no history of abuse Marital Status: single Sexual orientation: straight Children: none Employment: unemployed Highest level of education: high school Housing: lives with Tax inspector (caretaker) Finances:  waiting on disability Legal: no Special educational needs teacher: never served Consulting civil engineer: denies Pills stockpile: denies  Principal Problem: Schizoaffective disorder, depressive type (HCC) Discharge Diagnoses: Principal Problem:   Schizoaffective disorder, depressive type (HCC)  Past Psychiatric History: Per above Past Medical History:  Past Medical History:  Diagnosis Date   Burning with urination 05/03/2015   Contraceptive management 05/03/2015   Depression    Dysmenorrhea 12/30/2013   Heroin addiction (HCC)    Menorrhagia 12/30/2013   Menstrual extraction 12/30/2013   Migraines    Schizophrenia (HCC)    Social anxiety disorder 09/25/2016   Suicidal ideations    Vaginal odor 05/03/2015    Past Surgical History:  Procedure Laterality Date   NO PAST SURGERIES     Family History:  Family History  Problem Relation Age of Onset   Depression Mother    Hypertension Father    Hyperlipidemia Father    Cancer Paternal Grandmother        breast, uterine   Cirrhosis Paternal Grandfather        due to alcohol   Family Psychiatric  History: Per above Social History:  Social History   Substance and Sexual Activity  Alcohol Use Not Currently  Comment: socially    Social History   Substance and Sexual Activity   Drug Use Not Currently   Types: Methamphetamines   Comment: last used 10/07/21    Social History   Socioeconomic History   Marital status: Single    Spouse name: Not on file   Number of children: Not on file   Years of education: Not on file   Highest education level: Not on file  Occupational History   Occupation: Unemployed  Tobacco Use   Smoking status: Former    Packs/day: 0.50    Years: 1.00    Additional pack years: 0.00    Total pack years: 0.50    Types: Cigarettes    Passive exposure: Past   Smokeless tobacco: Never  Vaping Use   Vaping Use: Never used  Substance and Sexual Activity   Alcohol use: Not Currently    Comment: socially   Drug use: Not Currently    Types: Methamphetamines    Comment: last used 10/07/21   Sexual activity: Yes    Birth control/protection: Condom  Other Topics Concern   Not on file  Social History Narrative   Not on file   Social Determinants of Health   Financial Resource Strain: Not on file  Food Insecurity: No Food Insecurity (12/29/2022)   Hunger Vital Sign    Worried About Running Out of Food in the Last Year: Never true    Ran Out of Food in the Last Year: Never true  Transportation Needs: No Transportation Needs (12/29/2022)   PRAPARE - Administrator, Civil Service (Medical): No    Lack of Transportation (Non-Medical): No  Physical Activity: Not on file  Stress: Not on file  Social Connections: Not on file    Hospital Course:   During the patient's hospitalization, patient had extensive initial psychiatric evaluation, and follow-up psychiatric evaluations every day.  Psychiatric diagnoses provided upon initial assessment:  Schizoaffective disorder, depressive type   Patient's psychiatric medications were adjusted on admission:              -- Start risperidone 1.5 mg twice a day for psychotic symptoms and mood stabilization             -- Start chlordiazepoxide 25 mg 4 times daily, then 25 mg 3 times  daily, then 25 mg twice daily, then 25 mg once, then discontinue altogether             -- Continue fluoxetine 20 mg daily for depressive symptoms  During the hospitalization, other adjustments were made to the patient's psychiatric medication regimen:  Continued home prozac 20 mg daily CHANGED home risperdal to 3 mg qHS  During the hospitalization lab/imaging obtained: CBC, CMP unremarkable Tylenol level <10, salicylate level <7, BAL <10  Serum HCG quant negative UDS+benzos QTc 449  Patient's care was discussed during the interdisciplinary team meeting every day during the hospitalization.  Patient's side effects to prescribed psychiatric medications: NA  Assessment  Gradually, patient started adjusting to milieu. The patient was evaluated each day by a clinical provider to ascertain response to treatment. Improvement was noted by the patient's report of decreasing symptoms, improved sleep and appetite, affect, medication tolerance, behavior, and participation in unit programming.  Patient was asked each day to complete a self inventory noting mood, mental status, pain, new symptoms, anxiety and concerns.    Symptoms were reported as significantly decreased or resolved completely by discharge.   On day of discharge, the patient reports  that their mood is stable. The patient denied having suicidal thoughts for more than 48 hours prior to discharge.  Patient denies having homicidal thoughts.  Patient denies having auditory hallucinations.  Patient denies any visual hallucinations or other symptoms of psychosis. The patient was motivated to continue taking medication with a goal of continued improvement in mental health.   The patient reports their target psychiatric symptoms of psychosis responded well to the psychiatric medications, and the patient reports overall benefit other psychiatric hospitalization. Supportive psychotherapy was provided to the patient. The patient also participated  in regular group therapy while hospitalized. Coping skills, problem solving as well as relaxation therapies were also part of the unit programming.  Labs were reviewed with the patient, and abnormal results were discussed with the patient.  The patient is able to verbalize their individual safety plan to this provider.  Behavioral Events: Multiple, in the evening patient would have behavioral outburst demanding ativan, and would verbally threaten staff to get them. She would also scream in her room.   Restraints: NA  Groups: Attends a few of the groups  While future psychiatric events cannot be accurately predicted, the patient does not currently require acute inpatient psychiatric care and does not currently meet [facility] involuntary commitment criteria.  # It is recommended to the patient to continue psychiatric medications as prescribed, after discharge from the hospital.    # It is recommended to the patient to follow up with your outpatient psychiatric provider and PCP.  # It was discussed with the patient, the impact of alcohol, drugs, tobacco have been there overall psychiatric and medical wellbeing, and total abstinence from substance use was recommended to the patient.  # Prescriptions provided or sent directly to preferred pharmacy at discharge. Patient agreeable to plan. Given opportunity to ask questions. Appears to feel comfortable with discharge.    # In the event of worsening symptoms, the patient is instructed to call the crisis hotline, 911 and or go to the nearest ED for appropriate evaluation and treatment of symptoms. To follow-up with primary care provider for other medical issues, concerns and or health care needs  # Patient was discharged home with a plan to follow up as noted below.  Is patient on multiple antipsychotic therapies at discharge:  No   Has Patient had three or more failed trials of antipsychotic monotherapy by history:  No Recommended Plan for  Multiple Antipsychotic Therapies: NA Tobacco Cessation:  A prescription for an FDA-approved tobacco cessation medication provided at discharge  Physical Findings: BP 107/73 (BP Location: Left Arm)   Pulse 81   Temp 98 F (36.7 C) (Oral)   Resp 16   Ht 5\' 5"  (1.651 m)   Wt 78.9 kg   LMP 12/26/2022 (Exact Date)   SpO2 99%   BMI 28.96 kg/m   AIMS: Facial and Oral Movements Muscles of Facial Expression: None, normal Lips and Perioral Area: None, normal Jaw: None, normal Tongue: None, normal,Extremity Movements Upper (arms, wrists, hands, fingers): None, normal Lower (legs, knees, ankles, toes): None, normal, Trunk Movements Neck, shoulders, hips: None, normal, Overall Severity Severity of abnormal movements (highest score from questions above): None, normal Incapacitation due to abnormal movements: None, normal Patient's awareness of abnormal movements (rate only patient's report): No Awareness, Dental Status Current problems with teeth and/or dentures?: No Does patient usually wear dentures?: No  CIWA:  CIWA-Ar Total: 0  Physical Exam Vitals and nursing note reviewed.  Constitutional:      General: She  is awake. She is not in acute distress.    Appearance: She is not ill-appearing or diaphoretic.  HENT:     Head: Normocephalic.  Pulmonary:     Effort: Pulmonary effort is normal. No respiratory distress.  Neurological:     Mental Status: She is alert.    Review of Systems  Respiratory:  Negative for shortness of breath.   Cardiovascular:  Negative for chest pain.  Gastrointestinal:  Negative for nausea and vomiting.  Neurological:  Negative for dizziness and headaches.    Musculoskeletal: Strength & Muscle Tone: within normal limits Gait & Station: normal Patient leans: N/A   Presentation  General Appearance:Appropriate for Environment, Casual, Fairly Groomed Eye Contact:Fair Speech:Clear and Coherent, Normal Rate Volume:Decreased Handedness:Right  Mood and  Affect  Mood: ("tired") Affect:Appropriate, Congruent, Constricted  Thought Process  Thought Process:Goal Directed, Linear, Coherent Descriptions of Associations:Intact  Thought Content Suicidal Thoughts:Suicidal Thoughts: No Homicidal Thoughts:Homicidal Thoughts: No Hallucinations:Hallucinations: None Ideas of Reference:None Thought Content:Rumination  Sensorium  Memory:Immediate Fair Judgment:Impaired Insight:Shallow  Executive Functions  Orientation:Full (Time, Place and Person) Language:Good Concentration:Good Attention:Good Recall:Good Fund of Knowledge:Good  Psychomotor Activity  Psychomotor Activity:Psychomotor Activity: Normal  Assets  Assets:Communication Skills, Desire for Improvement, Resilience, Physical Health  Sleep  Quality:Fair  Social History   Tobacco Use  Smoking Status Former   Packs/day: 0.50   Years: 1.00   Additional pack years: 0.00   Total pack years: 0.50   Types: Cigarettes   Passive exposure: Past  Smokeless Tobacco Never    Blood Alcohol level:  Lab Results  Component Value Date   ETH <10 12/28/2022   ETH <10 09/13/2022    Metabolic Disorder Labs:  Lab Results  Component Value Date   HGBA1C 5.4 05/09/2020   MPG 108 05/09/2020   MPG 111.15 03/17/2020   Lab Results  Component Value Date   PROLACTIN 142.0 (H) 05/10/2020   PROLACTIN 113.0 (H) 11/11/2019   Lab Results  Component Value Date   CHOL 138 10/16/2019   TRIG 60 10/16/2019   HDL 36 (L) 10/16/2019   CHOLHDL 3.8 10/16/2019   VLDL 12 10/16/2019   LDLCALC 90 10/16/2019   LDLCALC 96 05/17/2019    Discharge destination: See above  Discharge Instructions     Activity as tolerated - No restrictions   Complete by: As directed    Diet general   Complete by: As directed       Allergies as of 01/03/2023       Reactions   Abilify [aripiprazole] Other (See Comments)   EPS, jaw locked up   Tylenol [acetaminophen] Other (See Comments)   Bumps on  skin/bottoms of feet   Haloperidol Other (See Comments)   EPS        Medication List     STOP taking these medications    clonazePAM 0.5 MG tablet Commonly known as: KLONOPIN       TAKE these medications      Indication  FLUoxetine 20 MG capsule Commonly known as: PROZAC Take 1 capsule (20 mg total) by mouth daily.  Indication: Depressive Phase of Manic-Depression   nicotine polacrilex 4 MG gum Commonly known as: NICORETTE Take 1 each (4 mg total) by mouth as needed for smoking cessation. What changed:  how much to take when to take this reasons to take this  Indication: Nicotine Addiction   risperiDONE 3 MG tablet Commonly known as: RISPERDAL Take 1 tablet (3 mg total) by mouth at bedtime.  Indication: MIXED BIPOLAR AFFECTIVE DISORDER  Follow-up Information     Services, [facility]. Go on 01/06/2023.   Why: You have a hospital follow up appointment for group or individual therapy and/or necessary medication management services on 01/06/23 at 10:00 am.  The appointment will be held in person. Contact information: [address] [place] [address] [phone]         [name], MD .   Specialty: Pediatrics Why: You have an appointment with you primary care provider for medication management services Contact information: [address] [place] [address] [phone]         [facility]. Schedule an appointment as soon as possible for a visit.   Why: Please call this provider to schedule an appointment for bereavement/grief therapy services, as we were unable to contact prior to discharge. Contact information: [address], [place], [place] [address]  Phone: [phone]               Discharge recommendations:   # It is recommended to the patient to continue psychiatric medications as prescribed, after discharge from the hospital.     # It is recommended to the patient to follow up  with your outpatient psychiatric provider -instructions on appointment date, time, and address (location) are provided to you in discharge paperwork  # Follow-up with outpatient primary care doctor and other specialists -for management of chronic medical disease, including:  Health Maintenance  # Testing: Follow-up with outpatient provider for abnormal lab results:  See above   # It was discussed with the patient, the impact of alcohol, drugs, tobacco have been there overall psychiatric and medical wellbeing, and total abstinence from substance use was recommended to the patient.   # Prescriptions provided or sent directly to preferred pharmacy at discharge. Patient agreeable to plan. Given opportunity to ask questions. Appears to feel comfortable with discharge.    # In the event of worsening symptoms, the patient is instructed to call the crisis hotline, 911 and or go to the nearest ED for appropriate evaluation and treatment of symptoms. To follow-up with primary care provider for other medical issues, concerns and or health care needs  Total Time Spent in Direct Patient Care: See attending attestation.  Signed: Princess Bruins, DO Psychiatry Resident, PGY-2 [facility]  [address] [place], [place] [address] Ph: [phone] Fax: [phone]

## 2023-03-01 ENCOUNTER — Emergency Department (HOSPITAL_COMMUNITY)
Admission: EM | Admit: 2023-03-01 | Discharge: 2023-03-04 | Disposition: A | Discharge status: Home / Self Care | Payer: BC Managed Care – PPO | Attending: Emergency Medicine | Admitting: Emergency Medicine

## 2023-03-01 ENCOUNTER — Other Ambulatory Visit: Payer: Self-pay

## 2023-03-01 ENCOUNTER — Encounter (HOSPITAL_COMMUNITY): Payer: Self-pay | Admitting: Emergency Medicine

## 2023-03-01 DIAGNOSIS — Z79899 Other long term (current) drug therapy: Secondary | ICD-10-CM | POA: Diagnosis not present

## 2023-03-01 DIAGNOSIS — F259 Schizoaffective disorder, unspecified: Secondary | ICD-10-CM | POA: Diagnosis not present

## 2023-03-01 DIAGNOSIS — F29 Unspecified psychosis not due to a substance or known physiological condition: Secondary | ICD-10-CM | POA: Diagnosis not present

## 2023-03-01 DIAGNOSIS — R443 Hallucinations, unspecified: Secondary | ICD-10-CM | POA: Diagnosis present

## 2023-03-01 LAB — RAPID URINE DRUG SCREEN, HOSP PERFORMED
Amphetamines: NOT DETECTED
Barbiturates: NOT DETECTED
Benzodiazepines: POSITIVE — AB
Cocaine: NOT DETECTED
Opiates: NOT DETECTED
Tetrahydrocannabinol: NOT DETECTED
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
## 2023-03-01 NOTE — ED Notes (Signed)
Pt refusing to allow staff to draw her blood work for labs to be drawn.

## 2023-03-01 NOTE — ED Triage Notes (Signed)
Pt states "my brain hurts really bad"

## 2023-03-02 LAB — COMPREHENSIVE METABOLIC PANEL WITH GFR
ALT: 26 U/L (ref 0–44)
AST: 19 U/L (ref 15–41)
Albumin: 3.6 g/dL (ref 3.5–5.0)
Alkaline Phosphatase: 73 U/L (ref 38–126)
Anion gap: 9 (ref 5–15)
BUN: 12 mg/dL (ref 6–20)
CO2: 24 mmol/L (ref 22–32)
Calcium: 8.8 mg/dL — ABNORMAL LOW (ref 8.9–10.3)
Chloride: 103 mmol/L (ref 98–111)
Creatinine, Ser: 0.76 mg/dL (ref 0.44–1.00)
GFR, Estimated: >60 mL/min (ref >60)
Glucose, Bld: 108 mg/dL — ABNORMAL HIGH (ref 70–99)
Potassium: 3.8 mmol/L (ref 3.5–5.1)
Sodium: 136 mmol/L (ref 135–145)
Total Bilirubin: 0.5 mg/dL (ref 0.3–1.2)
Total Protein: 6.9 g/dL (ref 6.5–8.1)

## 2023-03-02 LAB — SALICYLATE LEVEL: Salicylate Lvl: <7 mg/dL — ABNORMAL LOW (ref 7.0–30.0)

## 2023-03-02 LAB — CBC
HCT: 41.5 % (ref 36.0–46.0)
Hemoglobin: 13.5 g/dL (ref 12.0–15.0)
MCH: 28.5 pg (ref 26.0–34.0)
MCHC: 32.5 g/dL (ref 30.0–36.0)
MCV: 87.6 fL (ref 80.0–100.0)
Platelets: 282 K/uL (ref 150–400)
RBC: 4.74 MIL/uL (ref 3.87–5.11)
RDW: 13.2 % (ref 11.5–15.5)
WBC: 12.8 K/uL — ABNORMAL HIGH (ref 4.0–10.5)
nRBC: 0 % (ref 0.0–0.2)

## 2023-03-02 LAB — ACETAMINOPHEN LEVEL: Acetaminophen (Tylenol), Serum: <10 ug/mL — ABNORMAL LOW (ref 10–30)

## 2023-03-02 LAB — ETHANOL: Alcohol, Ethyl (B): <10 mg/dL (ref <10)

## 2023-03-02 MED ORDER — IBUPROFEN 400 MG PO TABS
600.0000 mg | ORAL_TABLET | Freq: Once | ORAL | Status: AC
  Administered 2023-03-02: 600 mg via ORAL
  Filled 2023-03-02: qty 2

## 2023-03-02 MED ORDER — STERILE WATER FOR INJECTION IJ SOLN
INTRAMUSCULAR | Status: AC
  Filled 2023-03-02: qty 10

## 2023-03-02 MED ORDER — IBUPROFEN 800 MG PO TABS
800.0000 mg | ORAL_TABLET | Freq: Once | ORAL | Status: AC
  Administered 2023-03-02: 800 mg via ORAL
  Filled 2023-03-02: qty 1

## 2023-03-02 MED ORDER — ONDANSETRON 4 MG PO TBDP
4.0000 mg | ORAL_TABLET | Freq: Once | ORAL | Status: AC
  Administered 2023-03-02: 4 mg via ORAL
  Filled 2023-03-02: qty 1

## 2023-03-02 MED ORDER — ONDANSETRON HCL 4 MG PO TABS
4.0000 mg | ORAL_TABLET | Freq: Once | ORAL | Status: AC
  Administered 2023-03-02: 4 mg via ORAL
  Filled 2023-03-02: qty 1

## 2023-03-02 MED ORDER — ZIPRASIDONE MESYLATE 20 MG IM SOLR
20.0000 mg | Freq: Once | INTRAMUSCULAR | Status: AC
  Administered 2023-03-02: 20 mg via INTRAMUSCULAR

## 2023-03-02 MED ORDER — NICOTINE 14 MG/24HR TD PT24
14.0000 mg | MEDICATED_PATCH | Freq: Every day | TRANSDERMAL | Status: DC
  Administered 2023-03-02 – 2023-03-03 (×2): 14 mg via TRANSDERMAL
  Filled 2023-03-02 (×2): qty 1

## 2023-03-02 MED ORDER — ZIPRASIDONE MESYLATE 20 MG IM SOLR
20.0000 mg | Freq: Once | INTRAMUSCULAR | Status: AC
  Administered 2023-03-02: 20 mg via INTRAMUSCULAR
  Filled 2023-03-02: qty 20

## 2023-03-02 MED ORDER — CLONAZEPAM 0.5 MG PO TABS
0.5000 mg | ORAL_TABLET | Freq: Two times a day (BID) | ORAL | Status: DC
  Administered 2023-03-02 – 2023-03-04 (×5): 0.5 mg via ORAL
  Filled 2023-03-02 (×5): qty 1

## 2023-03-02 MED ORDER — ZIPRASIDONE MESYLATE 20 MG IM SOLR
INTRAMUSCULAR | Status: AC
  Filled 2023-03-02: qty 20

## 2023-03-02 MED ORDER — POLYETHYLENE GLYCOL 3350 17 G PO PACK: 17.0000 g | PACK | Freq: Once | ORAL | Status: DC

## 2023-03-02 NOTE — ED Notes (Signed)
Faxed IVC papers to magistrate and [facility].

## 2023-03-02 NOTE — ED Notes (Signed)
Pt has returned to room with sitter from shower

## 2023-03-02 NOTE — Progress Notes (Signed)
Patient has been denied by [facility] due to no appropriate beds available. Patient meets BH inpatient criteria per Roselyn Bering, NP. Patient has been faxed out to the following facilities:   [facility] Pending - Request [address] [place] [phone]--ONGEX-BMWUX [facility] Pending - Request Sent--[address] [facility] [phone]--GLOVF-IEPP [facility] Pending - Request Sent--[address] [place] [phone]--DDUKG-URKYHCWC [facility] Pending - Request [address] [place] [phone]--NIOEV-OJJKK [facility] Pending - Request [address] [place] [phone]--WCHEN-IDP [facility] Pending - Request Sent--[address] [place] [phone]--DTOIZ-TIWPY [facility] Pending - Request [address], [place] [phone]--IOXBD-ZHGDJ [facility] Pending - Request Sent--[address] [place] [phone]--EYCXK-GYJEHU Health Pending - Request Sent--[address] [place] [phone]--[facility] Patient Placement Pending - Request Sent--[facility] [phone]--[facility] Pending - Request [address] [place], [place] [phone]--JGGEZ-MOQHUTML [facility] Pending - Request Sent--[address] [place] [phone]--HQPRF-FMBW [facility] Pending - Request Sent--[address] [place] [phone]--QTMAU-QJFHLKT [facility] Pending - Request Sent--[address] [place] [facility] [phone]--[facility] Pending -  Request [address] [place] [phone]--  Julia [name], MSW, LCSW-A  11:17 AM 03/02/2023

## 2023-03-02 NOTE — ED Notes (Addendum)
Aide called and reported pt has been using meth. Amount of time without sleep is unknown....reports that she has been awake for 4-5 days in the past. Pt was unwilling to take phone call
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
## 2023-03-02 NOTE — BH Assessment (Signed)
Comprehensive Clinical Assessment (CCA) Note  03/02/2023 Julia [name] [id number]  Chief Complaint:  Chief Complaint  Patient presents with   Hallucinations   Visit Diagnosis: Schizoaffective disorder  DISPOSITION: Gave clinical report to Addison Naegeli, NP who is recommending pt for Inpatient Psych treatment. Patient is under IVC status.   ED Care team notified , Dalia Heading, RN; Juanetta Snow, NT and Kennis Carina, MD.  Wendall Stade [facility] notified of the disposition via secure chat.   The patient demonstrates the following risk factors for suicide: Chronic risk factors for suicide include: psychiatric disorder of Schizoaffective disorder . Acute risk factors for suicide include: unemployment. Protective factors for this patient include: positive social support and hope for the future. Considering these factors, the overall suicide risk at this point appears to be low. Patient is not appropriate for outpatient follow up.  22 -year-old female patient brought to [facility] by police voluntarily after the patient presented to the police station reporting that she was hearing things in her head and told police that she believes that there is a serial killer. Per RN note, patient has not been taking medications (Risperdal and Prozac) but has been taking Klonopin daily. Pt has a history of schizoaffective disorder. Pt denies Suicidal Ideation and homicidal ideation. Pt denies access to weapons and history of violence. Pt denied history of suicide attempts and self -injurious behaviors. Upon assessment, pt denies AVH as well as events that led up to her being brought into the ED. Pt reported that she is in the ED for nausea and vomiting. Pt denied alcohol/substance use.  Pt denies stressors, supports and legal problems/involvement. Pt denies receiving outpatient treatment. Pt reports a prior inpatient psych hospitalization, however, does not recall when she hospitalized ( per chart, pt was admitted to  [facility] from 12/29/2022-01/03/2023).   Pt was petitioned for IVC by the [facility] Medical Doctor:  "Psychotic. Thinks she is a serial killer. Hallucinating. Danger to self/others."   Pt is dressed in scrubs alert, oriented x3. With soft speech and normal motor behavior. Eye contact was avoidant and pt is guarded and irritable during the assessment. Pt's mood is irritable, and affect is blunted. Thought process is coherent and relevant. Pt's insight is lacking, and judgement is heedless. There is no indication that the pt is responding to internal stimuli, however there is indication that pt is experiencing delusional thought content.  Pt was guarded and irritable throughout the assessment.      CCA Screening, Triage and Referral (STR)  Patient Reported Information How did you hear about [place]? Legal System  What Is the Reason for Your Visit/Call Today? 43 -year-old female patient brought to [facility] by police voluntarily after the patient presented to the police station reporting that she was hearing things in her head and told police that she believes that there is a serial killer. Per RN note, patient has not been taking medications (Risperdal and Prozac) but has been taking Klonopin daily. Pt has a history of schizoaffective disorder. Pt denies Suicidal Ideation and homicidal ideation. Pt denies access to weapons and history of violence. Pt denied history of suicide attempts and self -injurious behaviors. Upon assessment, pt denies AVH as well as events that led up to her being brought into the ED. Pt reported that she is in the ED for nausea and vomiting. Pt denied alcohol/substance use.  Pt was petitioned for IVC by the Medical Doctor.  How Long Has This Been Causing You Problems? 1-6 months  What  Do You Feel Would Help You the Most Today? Social Support; Treatment for Depression or other mood problem; Stress Management; Medication(s)   Have You Recently Had Any Thoughts About Hurting Yourself?  No  Are You Planning to Commit Suicide/Harm Yourself At This time? No   Flowsheet Row ED from 03/01/2023 in [facility] at [facility]mission [facility]Discharged[facility] from [facility]28/[facility]22 in [facility] Department [facility]  C-SSRS RISK CATEGORY No Risk No Risk No Risk       Have you Recently Had Thoughts About Hurting Someone [name]? No  Are You Planning to Harm Someone at This Time? No  Explanation: Pt denies SI/HI.   Have You Used Any Alcohol or Drugs in the Past 24 Hours? No  What Did You Use and How Much? Pt denies alcohol/ substance use.   Do You Currently Have a Therapist/Psychiatrist? No  Name of Therapist/Psychiatrist: Name of Therapist/Psychiatrist: Pt denies Outpatient Services   Have You Been Recently Discharged From Any Office Practice or Programs? No  Explanation of Discharge From Practice/Program: None     CCA Screening Triage Referral Assessment Type of Contact: Tele-Assessment  Telemedicine Service Delivery: Telemedicine service delivery: This service was provided via telemedicine using a 2-way, interactive audio and video technology  Is this Initial or Reassessment? Is this Initial or Reassessment?: Initial Assessment  Date Telepsych consult ordered in [facility]:  Date Telepsych consult ordered in [facility]: 03/02/23  Time Telepsych consult ordered in [facility]:  Time Telepsych consult ordered in [facility]: 0103  Location of Assessment: [facility]  Provider Location: [facility] Assessment Services   Collateral Involvement: Medical record   Does Patient Have a Automotive engineer Guardian? No  Legal Guardian Contact Information: Pt does not have a Legal Guardian.  Copy of Legal Guardianship Form: -- (Pt does not have a Legal Guardian.)  Legal Guardian Notified of Arrival: -- (Pt does not have a Legal Guardian.)  Legal Guardian Notified of Pending Discharge: -- (Pt does  not have a Legal Guardian.)  If Minor and Not Living with Parent(s), Who has Custody? Adult Patient -Pt does not have a Legal Guardian.  Is CPS involved or ever been involved? Never  Is APS involved or ever been involved? Never   Patient Determined To Be At Risk for Harm To Self or Others Based on Review of Patient Reported Information or Presenting Complaint? No  Method: No Plan  Availability of Means: No access or NA  Intent: Vague intent or NA  Notification Required: No need or identified person  Additional Information for Danger to Others Potential: Active psychosis  Additional Comments for Danger to Others Potential: Pt denies history of aggression  Are There Guns or Other Weapons in Your Home? No  Types of Guns/Weapons: Pt denies access to firearms  Are These Weapons Safely Secured?                            -- (Pt denies)  Who Could Verify You Are Able To Have These Secured: Pt denies access to firearms  Do You Have any Outstanding Charges, Pending Court Dates, Parole/Probation? Pt denies legal problems.  Contacted To Inform of Risk of Harm To Self or Others: -- (N/a)    Does Patient Present under Involuntary Commitment? Yes    [place] of Residence: [place]   Patient Currently Receiving the Following Services: Medication Management; Individual  Therapy; IOP (Intensive Outpatient Program)   Determination of Need: Emergent (2 hours)   Options For Referral: Inpatient Hospitalization; Intensive Outpatient Therapy; Medication Management     CCA Biopsychosocial Patient Reported Schizophrenia/Schizoaffective Diagnosis in Past: Yes   Strengths: Unknown   Mental Health Symptoms Depression:   None   Duration of Depressive symptoms:    Mania:   None   Anxiety:    Irritability; Difficulty concentrating   Psychosis:   Delusions; Hallucinations   Duration of Psychotic symptoms:  Duration of Psychotic Symptoms: Greater than six months   Trauma:    Avoids reminders of event; Emotional numbing   Obsessions:   None   Compulsions:   None   Inattention:   None   Hyperactivity/Impulsivity:   None   Oppositional/Defiant Behaviors:   None   Emotional Irregularity:   Potentially harmful impulsivity   Other Mood/Personality Symptoms:   None noted    Mental Status Exam Appearance and self-care  Stature:   Average   Weight:   Average weight   Clothing:   -- (Scrubs)   Grooming:   Normal   Cosmetic use:   Age appropriate   Posture/gait:   Normal   Motor activity:   Not Remarkable   Sensorium  Attention:   Inattentive   Concentration:   Variable   Orientation:   X5   Recall/memory:   Normal   Affect and Mood  Affect:   Blunted   Mood:   Irritable   Relating  Eye contact:   Avoided   Facial expression:   Depressed   Attitude toward examiner:   Guarded; Resistant; Uninterested   Thought and Language  Speech flow:  Normal   Thought content:   Delusions   Preoccupation:   None   Hallucinations:   Auditory   Organization:   Patent examiner of Knowledge:   Fair   Intelligence:   Average   Abstraction:   Armed forces technical officer:   Impaired   Reality Testing:   Distorted   Insight:   Lacking   Decision Making:   Impulsive   Social Functioning  Social Maturity:   Irresponsible; Impulsive   Social Judgement:   Heedless   Stress  Stressors:   -- (Pt denied Stressors)   Coping Ability:   Overwhelmed; Deficient supports   Skill Deficits:   Responsibility; Decision making   Supports:   Friends/Service system     Religion: Religion/Spirituality Are You A Religious Person?: No How Might This Affect Treatment?: N/a  Leisure/Recreation: Leisure / Recreation Do You Have Hobbies?: No  Exercise/Diet: Exercise/Diet Do You Exercise?: No Have You Gained or Lost A Significant Amount of Weight in the Past Six Months?: No Do You  Follow a Special Diet?: No Do You Have Any Trouble Sleeping?: No   CCA Employment/Education Employment/Work Situation: Employment / Work Situation Employment Situation: Unemployed Patient's Job has Been Impacted by Current Illness: No Has Patient ever Been in Equities trader?: No  Education: Education Is Patient Currently Attending School?: No Last Grade Completed: 12 Did You Product manager?: No Did You Have An Individualized Education Program (IIEP): No Did You Have Any Difficulty At [employer]?: No Patient's Education Has Been Impacted by Current Illness: No   CCA Family/Childhood History Family and Relationship History: Family history Marital status: Single Does patient have children?: No  Childhood History:  Childhood History By whom was/is the patient raised?: Both parents Did patient suffer any verbal/emotional/physical/sexual abuse as a child?:  No Did patient suffer from severe childhood neglect?: No Has patient ever been sexually abused/assaulted/raped as an adolescent or adult?: No Was the patient ever a victim of a crime or a disaster?: No Witnessed domestic violence?: No Has patient been affected by domestic violence as an adult?: No       CCA Substance Use Alcohol/Drug Use: Alcohol / Drug Use Pain Medications: See MAR Prescriptions: See MAR Over the Counter: See MAR History of alcohol / drug use?: No history of alcohol / drug abuse Longest period of sobriety (when/how long): Unknown                         ASAM's:  Six Dimensions of Multidimensional Assessment  Dimension 1:  Acute Intoxication and/or Withdrawal Potential:      Dimension 2:  Biomedical Conditions and Complications:      Dimension 3:  Emotional, Behavioral, or Cognitive Conditions and Complications:     Dimension 4:  Readiness to Change:     Dimension 5:  Relapse, Continued use, or Continued Problem Potential:     Dimension 6:  Recovery/Living Environment:     ASAM Severity  Score:    ASAM Recommended Level of Treatment:     Substance use Disorder (SUD)    Recommendations for Services/Supports/Treatments:    Discharge Disposition: Discharge Disposition Medical Exam completed: Yes  DSM5 Diagnoses: Patient Active Problem List   Diagnosis Date Noted   Schizoaffective disorder, depressive type (HCC) 12/29/2022   Suicidal ideations    Substance-induced disorder (HCC) 04/27/2021   Malingering 06/02/2020   Homicidal ideation 06/02/2020   Paranoia (HCC) 06/02/2020   Suicidal behavior 06/02/2020   Schizophrenia (HCC) 06/02/2020   Schizoaffective disorder (HCC) 05/08/2020   Schizophrenia, paranoid (HCC)    Cocaine dependence with cocaine-induced psychotic disorder with complication [facility])    Social anxiety disorder 09/25/2016   Polysubstance abuse (HCC)    Dysmenorrhea 12/30/2013     Referrals to Alternative Service(s): Referred to Alternative Service(s):   Place:   Date:   Time:    Referred to Alternative Service(s):   Place:   Date:   Time:    Referred to Alternative Service(s):   Place:   Date:   Time:    Referred to Alternative Service(s):   Place:   Date:   Time:     Mallie Darting, MSW, LCSW
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
## 2023-03-02 NOTE — ED Notes (Signed)
Called [facility] @ 01:24

## 2023-03-02 NOTE — ED Notes (Signed)
Pt hypotensive when vitals reassessed, requested to check BP x2 to verify, pt refused. RN and MD made aware of BP and refusal for reassessment.

## 2023-03-02 NOTE — ED Notes (Signed)
Pt requested to shower. Pt is in shower. Pt sitter is monitoring

## 2023-03-03 MED ORDER — CLONAZEPAM 0.5 MG PO TABS
0.5000 mg | ORAL_TABLET | Freq: Once | ORAL | Status: AC
  Administered 2023-03-03: 0.5 mg via ORAL
  Filled 2023-03-03: qty 1

## 2023-03-03 MED ORDER — RISPERIDONE 1 MG PO TABS
3.0000 mg | ORAL_TABLET | Freq: Every day | ORAL | Status: DC
  Administered 2023-03-03 – 2023-03-04 (×3): 3 mg via ORAL
  Filled 2023-03-03 (×2): qty 3

## 2023-03-03 MED ORDER — ONDANSETRON 4 MG PO TBDP
4.0000 mg | ORAL_TABLET | Freq: Once | ORAL | Status: AC
  Administered 2023-03-03: 4 mg via ORAL
  Filled 2023-03-03: qty 1

## 2023-03-03 MED ORDER — FLUOXETINE HCL 20 MG PO CAPS
20.0000 mg | ORAL_CAPSULE | Freq: Every day | ORAL | Status: DC
  Administered 2023-03-03 – 2023-03-04 (×2): 20 mg via ORAL
  Filled 2023-03-03 (×2): qty 1

## 2023-03-03 MED ORDER — RISPERIDONE 1 MG PO TABS: 2.0000 mg | ORAL_TABLET | Freq: Once | ORAL | Status: DC

## 2023-03-03 NOTE — Progress Notes (Signed)
Pt was accepted to [facility] 03/04/2023, pending IVC paperwork faxed to [phone]. Bed assignment: Main campus  Pt meets inpatient criteria per Hillery Jacks, NP  Attending Physician will be Loni Beckwith, MD  Report can be called to: [phone] (this is a pager, please leave call-back number when giving report)  Pt can arrive after 8 AM  Care Team Notified: Leana Gamer, RN, Orson Slick, RN, and Hillery Jacks, NP  Cathie Beams, LCSW  03/03/2023 10:29 AM

## 2023-03-03 NOTE — ED Notes (Signed)
Pt continues to step into the hallway and have silent conversations with the surveillance camera. Occasionally writes a note that she shows to the camera.

## 2023-03-03 NOTE — ED Notes (Signed)
Pt given her cell phone to get a number out. Locked black iphone back in pt's belongings bag in locker at this time.

## 2023-03-03 NOTE — ED Notes (Signed)
Pt was accepted to [facility] 03/04/2023, pending IVC paperwork faxed to [phone]. Bed assignment: Main campus  Pt meets inpatient criteria per Hillery Jacks, NP  Attending Physician will be Loni Beckwith, MD  Report can be called to: [phone] (this is a pager, please leave call-back number when giving report)  Pt can arrive after 8 AM    IVC paperwork faxed at this time.

## 2023-03-03 NOTE — ED Notes (Signed)
Pt ambulated to the bathroom with sitter. In NAD.

## 2023-03-03 NOTE — ED Notes (Signed)
Pt ambulated to the bathroom with sitter 

## 2023-03-03 NOTE — ED Notes (Signed)
Pt requesting routine medications for psychosis. Spoke with EDP and medications given at this time. Will contact pharmacy to adjust times.

## 2023-03-03 NOTE — ED Notes (Addendum)
Pt to bathroom with sitter.

## 2023-03-03 NOTE — Progress Notes (Signed)
LCSW Progress Note  [id number]   Julia [name]  03/03/2023  10:07 AM  Description:   Inpatient Psychiatric Referral  Patient was recommended inpatient per Hillery Jacks, NP. There are no available beds at [facility], per [facility] Malva Limes, RN. Patient was referred to the following out of network facilities:   Destination  Service Provider Address Phone [facility]  [address] [place] [place] [address] [phone]  [facility]  [address] [place] [address] [phone]  [facility]  [address], [place] [address] [phone]  [facility]  [address] [place] [address] [phone]  [facility]  [address] [place] [address] [phone]  [facility]  [address], [place] [address] [phone]  [facility]  [address], [place] [address] [phone]  [facility]  [address], [place] [address] [phone]  [facility]  [address] [place] [address] [phone]  [facility] Patient Placement  [facility], [place] [phone]  [facility]  [address] [place], [place] [address] [phone]  [facility]  [address] [place], [place] [address] [phone]  [facility]  [address] [place] [address] [phone]  [facility]  [address], [place] [address] [phone]  [facility]  [address] [place] [address] [phone]  [facility]  [address], [place] [address] [phone]  [facility] [place] [address] [phone]  [facility]  [address] [place] [address] [phone]  [facility]  [place] [phone]  [facility] EFA[facility]  [address] [place], [place] [phone]  [facility]  [address] [place] [address] [phone]  [facility] [place]  [place] [phone]  [facility]  [address] [place] [address] [phone]  [facility]  [address] [place] [address] [phone]  [facility]  [address] [place] [address] [phone]    Situation ongoing, CSW to continue following and update chart as more information becomes available.      Cathie Beams, LCSW  03/03/2023 10:07 AM

## 2023-03-03 NOTE — ED Provider Notes (Signed)
Emergency Medicine Observation Re-evaluation Note  Julia [name] is a 22 y.o. female, seen on rounds today.  Pt initially presented to the ED for complaints of Hallucinations Currently, the patient is slightly agitated, she is requesting that we start her Prozac and Risperdal.  However she does not know how she takes it.  Physical Exam  BP 101/74 (BP Location: Left Arm)   Pulse 60   Temp 98.4 F (36.9 C) (Oral)   Resp 17   SpO2 98%  Physical Exam General: No acute distress Cardiac: Regular rate Lungs: No respiratory distress Psych: Slightly agitated at this time  ED Course / MDM  EKG:   I have reviewed the labs performed to date as well as medications administered while in observation.  Recent changes in the last 24 hours include no acute events overnight.  Plan  Current plan is for for me to initiate Risperdal.  I initiated nightly Risperdal and ordered oral Risperdal for now.  Nursing staff informed me that patient told them that she takes Risperdal in the morning, therefore they gave her the 3 mg already.  I have also started patient's Prozac.  Psychiatry team in the round will have to ensure that she is getting her medications appropriately.    Derwood Kaplan, MD 03/03/23 [phone]

## 2023-03-04 NOTE — ED Notes (Signed)
Pt ambulated to the bathroom with sitter 

## 2023-03-04 NOTE — ED Notes (Signed)
Bags removed from locker and given to police.

## 2023-03-04 NOTE — ED Notes (Signed)
Pt wanting more soda. Pt informed that no more drinks with caffeine will be given tonight. Water, sprite, and an [place] ice were offered. Pt declined

## 2023-03-04 NOTE — ED Provider Notes (Signed)
Emergency Medicine Observation Re-evaluation Note  Julia [name] is a 22 y.o. female, seen on rounds today.  Pt initially presented to the ED for complaints of Hallucinations Currently, the patient is awaiting transfer to [facility] psychiatric unit.  Physical Exam  BP 94/67 (BP Location: Right Arm)   Pulse 78   Temp 98 F (36.7 C) (Oral)   Resp 16   SpO2 98%  Physical Exam Awake in no acute distress  ED Course / MDM  EKG:   I have reviewed the labs performed to date as well as medications administered while in observation.  Recent changes in the last 24 hours include none.  Plan  Current plan is for [facility] to [facility] today.    Bethann Berkshire, MD 03/04/23 [phone]

## 2023-04-05 ENCOUNTER — Encounter (HOSPITAL_COMMUNITY): Payer: Self-pay | Admitting: Emergency Medicine

## 2023-04-05 ENCOUNTER — Other Ambulatory Visit: Payer: Self-pay

## 2023-04-05 ENCOUNTER — Emergency Department (HOSPITAL_COMMUNITY)
Admission: EM | Admit: 2023-04-05 | Discharge: 2023-04-06 | Disposition: A | Discharge status: Other Acute Inpatient Hospital | Payer: BC Managed Care – PPO | Attending: Emergency Medicine | Admitting: Emergency Medicine

## 2023-04-05 DIAGNOSIS — Z79899 Other long term (current) drug therapy: Secondary | ICD-10-CM | POA: Insufficient documentation

## 2023-04-05 DIAGNOSIS — Z20822 Contact with and (suspected) exposure to covid-19: Secondary | ICD-10-CM | POA: Diagnosis not present

## 2023-04-05 DIAGNOSIS — F1910 Other psychoactive substance abuse, uncomplicated: Secondary | ICD-10-CM | POA: Insufficient documentation

## 2023-04-05 DIAGNOSIS — F251 Schizoaffective disorder, depressive type: Secondary | ICD-10-CM | POA: Insufficient documentation

## 2023-04-05 DIAGNOSIS — F1994 Other psychoactive substance use, unspecified with psychoactive substance-induced mood disorder: Secondary | ICD-10-CM | POA: Diagnosis present

## 2023-04-05 DIAGNOSIS — F29 Unspecified psychosis not due to a substance or known physiological condition: Secondary | ICD-10-CM

## 2023-04-05 DIAGNOSIS — F1914 Other psychoactive substance abuse with psychoactive substance-induced mood disorder: Secondary | ICD-10-CM | POA: Insufficient documentation

## 2023-04-05 DIAGNOSIS — F1999 Other psychoactive substance use, unspecified with unspecified psychoactive substance-induced disorder: Secondary | ICD-10-CM | POA: Diagnosis present

## 2023-04-05 DIAGNOSIS — R451 Restlessness and agitation: Secondary | ICD-10-CM | POA: Diagnosis present

## 2023-04-05 LAB — COMPREHENSIVE METABOLIC PANEL
ALT: 21 U/L (ref 0–44)
AST: 18 U/L (ref 15–41)
Albumin: 4.1 g/dL (ref 3.5–5.0)
Alkaline Phosphatase: 85 U/L (ref 38–126)
Anion gap: 9 (ref 5–15)
BUN: 14 mg/dL (ref 6–20)
CO2: 24 mmol/L (ref 22–32)
Calcium: 9.2 mg/dL (ref 8.9–10.3)
Chloride: 103 mmol/L (ref 98–111)
Creatinine, Ser: 0.93 mg/dL (ref 0.44–1.00)
GFR, Estimated: >60 mL/min (ref >60)
Glucose, Bld: 101 mg/dL — ABNORMAL HIGH (ref 70–99)
Potassium: 3.8 mmol/L (ref 3.5–5.1)
Sodium: 136 mmol/L (ref 135–145)
Total Bilirubin: 0.7 mg/dL (ref 0.3–1.2)
Total Protein: 7.6 g/dL (ref 6.5–8.1)

## 2023-04-05 LAB — CBC WITH DIFFERENTIAL/PLATELET
Abs Immature Granulocytes: 0.04 10*3/uL (ref 0.00–0.07)
Basophils Absolute: 0 10*3/uL (ref 0.0–0.1)
Basophils Relative: 1 %
Eosinophils Absolute: 0.1 10*3/uL (ref 0.0–0.5)
Eosinophils Relative: 1 %
HCT: 44.2 % (ref 36.0–46.0)
Hemoglobin: 14.2 g/dL (ref 12.0–15.0)
Immature Granulocytes: 1 %
Lymphocytes Relative: 30 %
Lymphs Abs: 2.3 10*3/uL (ref 0.7–4.0)
MCH: 28.3 pg (ref 26.0–34.0)
MCHC: 32.1 g/dL (ref 30.0–36.0)
MCV: 88 fL (ref 80.0–100.0)
Monocytes Absolute: 0.5 10*3/uL (ref 0.1–1.0)
Monocytes Relative: 7 %
Neutro Abs: 4.5 10*3/uL (ref 1.7–7.7)
Neutrophils Relative %: 60 %
Platelets: 282 10*3/uL (ref 150–400)
RBC: 5.02 MIL/uL (ref 3.87–5.11)
RDW: 13.1 % (ref 11.5–15.5)
WBC: 7.4 10*3/uL (ref 4.0–10.5)
nRBC: 0 % (ref 0.0–0.2)

## 2023-04-05 LAB — RAPID URINE DRUG SCREEN, HOSP PERFORMED
Amphetamines: POSITIVE — AB
Barbiturates: NOT DETECTED
Benzodiazepines: POSITIVE — AB
Cocaine: POSITIVE — AB
Opiates: NOT DETECTED
Tetrahydrocannabinol: NOT DETECTED

## 2023-04-05 LAB — POC URINE PREG, ED: Preg Test, Ur: NEGATIVE

## 2023-04-05 MED ORDER — ZIPRASIDONE MESYLATE 20 MG IM SOLR
20.0000 mg | Freq: Once | INTRAMUSCULAR | Status: AC
  Administered 2023-04-05: 20 mg via INTRAMUSCULAR
  Filled 2023-04-05: qty 20

## 2023-04-05 MED ORDER — LORAZEPAM 1 MG PO TABS
1.0000 mg | ORAL_TABLET | Freq: Once | ORAL | Status: DC
  Filled 2023-04-05: qty 1

## 2023-04-05 MED ORDER — STERILE WATER FOR INJECTION IJ SOLN
INTRAMUSCULAR | Status: AC
  Administered 2023-04-05: 10 mL
  Filled 2023-04-05: qty 10

## 2023-04-05 MED ORDER — LORAZEPAM 1 MG PO TABS
1.0000 mg | ORAL_TABLET | Freq: Once | ORAL | Status: AC
  Administered 2023-04-05: 1 mg via ORAL
  Filled 2023-04-05: qty 1

## 2023-04-05 NOTE — ED Notes (Signed)
Pt unable to provide urine at this time, after trying.

## 2023-04-05 NOTE — Progress Notes (Signed)
Per Joaquin Courts, NP pt meets inpatient [facility] placement. Day [facility] Antoinette Cillo, RN, [facility] Zachary George has been requested to review.    Maryjean Ka, MSW, [facility] 04/05/2023 6:15 PM

## 2023-04-05 NOTE — BH Assessment (Signed)
Comprehensive Clinical Assessment (CCA) Note  04/05/2023 [name] [id number]  DISPOSITION: Tiburcio Pea NP recommends an inpatient admission.   The patient demonstrates the following risk factors for suicide: Chronic risk factors for suicide include: psychiatric disorder of Schizoaffective D/O . Acute risk factors for suicide include:  Mental health D/O . Protective factors for this patient include: coping skills. Considering these factors, the overall suicide risk at this point appears to be high. Patient is not appropriate for outpatient follow up.   Patient is a 22 year old single female who presents to [facility] ED after being transported by law enforcement with IVC after patient was found in the community with AMS and making statements to "cut her wrists." Per chart review patient has a history significant for Schizoaffective Disorder, delusions, paranoia and per note of 12/28/22 when she was last assessed meeting inpatient criteria at that time after presenting with similar symptoms, was noted to have been receiving OP services from [facility] in [place] although when patient was asked this date to verify stated, "she doesn't know."   Patient when asked this date in reference to S/I stated, "I don't know" and when asked in reference to the statements she made contained in the IVC in reference to her wanting to, "slit her wrists," stated, "she didn't remember." Patient denies any H/I although reports, "voices in the back of her head are calling her Erskine Squibb Doe." Patient denies any VH. Patient is observed to be altered and will not respond to most questions associated with assessment.   Medical record indicates she has received ACTT services in the past but she denies current services. Patient's medical record indicates she has been psychiatrically hospitalized at least three times in [place] and as mentioned above seen on 6/15 when she met inpatient criteria at that time.   Patient denies alcohol or  other substance use. Medical record indicates a history of using alcohol and cocaine in the past with UDS results outstanding at the time of assessment.   Patient appears guarded and renders limited history. She cannot identify any stressors and appears very uninterested in participating in the assessment. Patient's medical record indicates a history of homelessness.    Patient is dressed in hospital scrubs, slightly drowsy, and oriented x4. Patient will not respond to most of this writer's questions and is observed to be guarded rendering limited history. Patient speaks in a low voice that is difficult to understand at times and doesn't make any eye contact. Patient's mood is suspicious with affect congruent. Thought process is coherent with delusional thought content. Patient's insight is poor and judgment is impaired.      Chief Complaint: No chief complaint on file.  Visit Diagnosis: Schizoaffective D/O     CCA Screening, Triage and Referral (STR)  Patient Reported Information How did you hear about [place]? Legal System  What Is the Reason for Your Visit/Call Today? EDP note on arrival: "Julia [name] is a 22 y.o. female. Brought into the ER for psychiatric evaluation by [facility]. Police officer states they had found her wandering near the [employer], she was stating that [employer] has been following her but she did not seem to have other complaints so they were walking away and she stated that she was going to go "slit her wrists". Patient arrives with IVC and per chart review has a history signifcant for Schizoaffective Disorder  How Long Has This Been Causing You Problems? > than 6 months (per chart review)  What Do You  Feel Would Help You the Most Today? Treatment for Depression or other mood problem  Flowsheet Row ED from 04/05/2023 in [facility] ED from 03/01/2023 in [facility] Admission  (Discharged) [facility]  C-SSRS RISK CATEGORY High Risk No Risk No Risk       Have You Recently Had Any Thoughts About Hurting Yourself? Yes  Are You Planning to Commit Suicide/Harm Yourself At This time? No   Flowsheet Row ED from [facility]05/2023 in [facility] at [facility] ED from [facility]/20[facility] Ad[facility] (Discharged) from [facility]  C-SSRS RISK CATEGORY No Risk No Risk No Risk       Have you Recently Had Thoughts About Hurting Someone [name]? No  Are You Planning to Harm Someone at This Time? No  Explanation: NA   Have You Used Any Alcohol or Drugs in the Past 24 Hours? No  What Did You Use and How Much? Pt denies with UDS pending   Do You Currently Have a Therapist/Psychiatrist? No  Name of Therapist/Psychiatrist: Name of Therapist/Psychiatrist: Pt denies although per chart review patient has been receving [facility]   Have You Been Recently Discharged From Any Office Practice or Programs? No  Explanation of Discharge From Practice/Program: NA     CCA Screening Triage Referral Assessment Type of Contact: Tele-Assessment  Telemedicine Service Delivery: Telemedicine service delivery: This service was provided via telemedicine using a 2-way, interactive audio and video technology  Is this Initial or Reassessment? Is this Initial or Reassessment?: Initial Assessment  Date Telepsych consult ordered in [facility]:  Date Telepsych consult ordered in [facility]: 04/05/23  Time Telepsych consult ordered in [facility]:  Time Telepsych consult ordered in [facility]: 1620  Location of Assessment: [facility] ED  Provider Location: [facility] Assessment Services   Collateral Involvement: None at this time   Does Patient Have a Automotive engineer Guardian? No  Legal Guardian Contact Information: NA  Copy of Legal  Guardianship Form: -- (NA)  Legal Guardian Notified of Arrival: -- (NA)  Legal Guardian Notified of Pending Discharge: -- (NA)  If Minor and Not Living with Parent(s), Who has Custody? NA  Is CPS involved or ever been involved? Never  Is APS involved or ever been involved? Never   Patient Determined To Be At Risk for Harm To Self or Others Based on Review of Patient Reported Information or Presenting Complaint? Yes, for Self-Harm  Method: Plan with intent and identified person (Per IVC)  Availability of Means: No access or NA  Intent: Vague intent or NA  Notification Required: No need or identified person  Additional Information for Danger to Others Potential: -- (NA)  Additional Comments for Danger to Others Potential: None noted  Are There Guns or Other Weapons in Your Home? No  Types of Guns/Weapons: Pt denies  Are These Weapons Safely Secured?                            -- (NA)  Who Could Verify You Are Able To Have These Secured: NA  Do You Have any Outstanding Charges, Pending Court Dates, Parole/Probation? UTA  Contacted To Inform of Risk of Harm To Self or Others: Other: Comment (NA)    Does Patient Present under Involuntary Commitment? Yes  [place] of Residence: [place]   Patient Currently Receiving the Following Services: Medication Management (Per chart review)   Determination of Need: Urgent (48 hours)   Options For Referral: Inpatient Hospitalization     CCA Biopsychosocial Patient Reported Schizophrenia/Schizoaffective Diagnosis in Past: Yes   Strengths: UTA   Mental Health Symptoms Depression:   None   Duration of Depressive symptoms:    Mania:   None   Anxiety:    Irritability; Difficulty concentrating   Psychosis:   Delusions; Hallucinations   Duration of Psychotic symptoms:  Duration of Psychotic Symptoms: Less than six months   Trauma:   None   Obsessions:   None   Compulsions:   None   Inattention:    None   Hyperactivity/Impulsivity:   None   Oppositional/Defiant Behaviors:   None   Emotional Irregularity:   None   Other Mood/Personality Symptoms:   None noted    Mental Status Exam Appearance and self-care  Stature:   Average   Weight:   Average weight   Clothing:   -- (Scrubs)   Grooming:   Neglected   Cosmetic use:   Age appropriate   Posture/gait:   Bizarre   Motor activity:   Not Remarkable   Sensorium  Attention:   Inattentive   Concentration:   Variable   Orientation:   X5   Recall/memory:   Normal   Affect and Mood  Affect:   Blunted   Mood:   Irritable   Relating  Eye contact:   Avoided   Facial expression:   Anxious   Attitude toward examiner:   Resistant; Uninterested   Thought and Language  Speech flow:  Clear and Coherent   Thought content:   Delusions   Preoccupation:   None   Hallucinations:   Auditory   Organization:   Patent examiner of Knowledge:   Poor   Intelligence:   Average   Abstraction:   Armed forces technical officer:   Impaired   Reality Testing:   Distorted   Insight:   Lacking   Decision Making:   Impulsive   Social Functioning  Social Maturity:   Irresponsible; Impulsive   Social Judgement:   Normal   Stress  Stressors:   -- (Pt denied Stressors)   Coping Ability:   Human resources officer Deficits:   Decision making   Supports:   Support needed     Religion: Religion/Spirituality Are You A Religious Person?: No How Might This Affect Treatment?: N/a  Leisure/Recreation: Leisure / Recreation Do You Have Hobbies?: No  Exercise/Diet: Exercise/Diet Do You Exercise?: No Have You Gained or Lost A Significant Amount of Weight in the Past Six Months?: No Do You Follow a Special Diet?: No Do You Have Any Trouble Sleeping?: No   CCA Employment/Education Employment/Work Situation: Employment / Work Situation Employment Situation:  Unemployed Patient's Job has Been Impacted by Current Illness: No Has Patient ever Been in Equities trader?: No  Education: Education Is Patient Currently Attending School?: No Last Grade Completed: 12 Did You Product manager?: No Did You Have An Individualized Education Program (IIEP): No Did You Have Any Difficulty At [employer]?: No Patient's Education Has Been Impacted by Current Illness: No   CCA Family/Childhood History Family and Relationship History: Family history Marital status: Single Does patient have children?: No  Childhood History:  Childhood History By whom was/is the patient raised?: Both parents Did patient suffer any verbal/emotional/physical/sexual abuse as a child?: No Did  patient suffer from severe childhood neglect?: No Has patient ever been sexually abused/assaulted/raped as an adolescent or adult?: No Was the patient ever a victim of a crime or a disaster?: No Witnessed domestic violence?: No Has patient been affected by domestic violence as an adult?: No       CCA Substance Use Alcohol/Drug Use: Alcohol / Drug Use Pain Medications: See MAR Prescriptions: See MAR Over the Counter: See MAR History of alcohol / drug use?: No history of alcohol / drug abuse Longest period of sobriety (when/how long): Unknown Negative Consequences of Use:  (None) Withdrawal Symptoms:  (None)                         ASAM's:  Six Dimensions of Multidimensional Assessment  Dimension 1:  Acute Intoxication and/or Withdrawal Potential:   Dimension 1:  Description of individual's past and current experiences of substance use and withdrawal: Pt denies recent alcohol or substance use  Dimension 2:  Biomedical Conditions and Complications:   Dimension 2:  Description of patient's biomedical conditions and  complications: Pt denies recent alcohol or substance use  Dimension 3:  Emotional, Behavioral, or Cognitive Conditions and Complications:  Dimension 3:   Description of emotional, behavioral, or cognitive conditions and complications: Pt denies recent alcohol or substance use  Dimension 4:  Readiness to Change:  Dimension 4:  Description of Readiness to Change criteria: Pt denies recent alcohol or substance use  Dimension 5:  Relapse, Continued use, or Continued Problem Potential:  Dimension 5:  Relapse, continued use, or continued problem potential critiera description: Pt denies recent alcohol or substance use  Dimension 6:  Recovery/Living Environment:  Dimension 6:  Recovery/Iiving environment criteria description: Pt denies recent alcohol or substance use  ASAM Severity Score: ASAM's Severity Rating Score: 0  ASAM Recommended Level of Treatment: ASAM Recommended Level of Treatment:  (N/A)   Substance use Disorder (SUD) Substance Use Disorder (SUD)  Checklist Symptoms of Substance Use:  (NA)  Recommendations for Services/Supports/Treatments: Recommendations for Services/Supports/Treatments Recommendations For Services/Supports/Treatments:  (NA)  Discharge Disposition:    DSM5 Diagnoses: Patient Active Problem List   Diagnosis Date Noted   Schizoaffective disorder, depressive type (HCC) 12/29/2022   Suicidal ideations    Substance-induced disorder (HCC) 04/27/2021   Malingering 06/02/2020   Homicidal ideation 06/02/2020   Paranoia (HCC) 06/02/2020   Suicidal behavior 06/02/2020   Schizophrenia (HCC) 06/02/2020   Schizoaffective disorder (HCC) 05/08/2020   Schizophrenia, paranoid (HCC)    Cocaine dependence with cocaine-induced psychotic disorder with complication [facility])    Social anxiety disorder 09/25/2016   Polysubstance abuse (HCC)    Dysmenorrhea 12/30/2013     Referrals to Alternative Service(s): Referred to Alternative Service(s):   Place:   Date:   Time:    Referred to Alternative Service(s):   Place:   Date:   Time:    Referred to Alternative Service(s):   Place:   Date:   Time:    Referred to Alternative  Service(s):   Place:   Date:   Time:     Alfredia Ferguson, LCAS

## 2023-04-05 NOTE — ED Notes (Signed)
Pt refused to let anyone stick her for blood

## 2023-04-05 NOTE — ED Triage Notes (Signed)
Pt BIB [employer] for emergency IVC, per officer pt was at [employer] and told them she was going to runaway and cut her wrist pt denies, but says she's being followed by "Gabriel Carina".

## 2023-04-05 NOTE — ED Notes (Signed)
Pt taking shower at this time, sitter present outside bathroom door

## 2023-04-05 NOTE — ED Provider Notes (Signed)
[name] EMERGENCY DEPARTMENT AT [facility] Provider Note   CSN: [id number] Arrival date & time: 04/05/23  1251     History  No chief complaint on file.   Julia [name] is a 22 y.o. female.  Brought into the ER for psychiatric evaluation by [name] PD.  [name]  Please officer states they had found her wandering near at the Julia [name], she was stating that [place] has been following her but she did not seem to have other complaints so they were walking away and she stated that she was going to go "slit her wrists".  Per the officer.      HPI     Home Medications Prior to Admission medications   Medication Sig Start Date End Date Taking? Authorizing Provider  clonazePAM (KLONOPIN) 0.5 MG tablet Take 0.5 mg by mouth 2 (two) times daily.    [provider]  FLUoxetine (PROZAC) 20 MG capsule Take 1 capsule (20 mg total) by mouth daily. Patient not taking: Reported on 03/02/2023 01/03/23 02/02/23  Princess Bruins, DO  risperiDONE (RISPERDAL) 3 MG tablet Take 1 tablet (3 mg total) by mouth at bedtime. Patient not taking: Reported on 03/02/2023 01/03/23 02/02/23  Princess Bruins, DO      Allergies    Abilify [aripiprazole], Haldol [haloperidol], and Tylenol [acetaminophen]    Review of Systems   Review of Systems  Physical Exam Updated Vital Signs There were no vitals taken for this visit. Physical Exam Vitals and nursing note reviewed.  Constitutional:      General: She is not in acute distress.    Appearance: She is well-developed.  HENT:     Head: Normocephalic and atraumatic.     Nose: Nose normal.     Mouth/Throat:     Mouth: Mucous membranes are moist.  Eyes:     Conjunctiva/sclera: Conjunctivae normal.  Cardiovascular:     Rate and Rhythm: Normal rate and regular rhythm.     Heart sounds: No murmur heard. Pulmonary:     Effort: Pulmonary effort is normal. No respiratory distress.     Breath sounds: Normal breath sounds.  Abdominal:      Palpations: Abdomen is soft.     Tenderness: There is no abdominal tenderness.  Musculoskeletal:        General: No swelling. Normal range of motion.     Cervical back: Neck supple.  Skin:    General: Skin is warm and dry.     Capillary Refill: Capillary refill takes less than 2 seconds.  Neurological:     General: No focal deficit present.     Mental Status: She is alert and oriented to person, place, and time.  Psychiatric:        Mood and Affect: Mood normal.     Comments: Patient initially agitated,, he is calm and cooperative after IM Geodon.  Reporting to nurse and police officers that she is being followed by Julia [name], very guarded in my interactions, answering questions that she is not suicidal or homicidal, denies hearing voices or any visual hallucinations at this time to me     ED Results / Procedures / Treatments   Labs (all labs ordered are listed, but only abnormal results are displayed) Labs Reviewed  COMPREHENSIVE METABOLIC PANEL  ETHANOL  RAPID URINE DRUG SCREEN, HOSP PERFORMED  CBC WITH DIFFERENTIAL/PLATELET  POC URINE PREG, ED    EKG None  Radiology No results found.  Procedures Procedures    Medications Ordered in ED  Medications  ziprasidone (GEODON) injection 20 mg (has no administration in time range)    ED Course/ Medical Decision Making/ A&P                                 Medical Decision Making Dx: Intoxication, psychosis, schizophrenia, electrolyte abnormality, encephalopathy, other  ED course:I heard patient screaming in the hallway, she was sitting in the vertical treatment chair with police at bedside, I went to evaluate her and patient was very agitated and screaming, seem to be primarily screaming to the officers to take her handcuffs off, unfortunately until she got to her room they informed her they had to wait to take them off per their protocol.  She continue to be very agitated.  Police were telling me that she had told them  she was going to go slit her wrists and I went to discuss this with the patient, she started screaming, raise her arms and running at me.  Please officers standing next to her were able to restrain her into the chair, charge nurse notified and patient was put in bed, given Geodon for her severe agitation and threatening behavior, which she has received at prior visits without adverse events based on chart review.  I interviewed patient after she received the Geodon she is much more calm, asking for Ativan or Klonopin which she takes at home for her anxiety.  She denies SI and states she did not see she was going to slit her wrist but was telling the nurse about [name] following her around, also had told the nurse that people "tell me I am crazy" because I "see lines and people's heads ".  Patient denies SI, HI to me, denies hallucinations states she has been compliant with her medications at home.  She denies any other psychiatric issues, no medical complaints, she has not had fevers or chills, no nausea or vomiting, no other complaints.  CBC and CMP are reassuring, awaiting UDS and urine pregnancy test.  Patient is not pregnant, urine drug screen is positive for cocaine, benzodiazepines and amphetamines.  Was evaluated by behavioral health and they are looking for inpatient bed for her at this time.  Amount and/or Complexity of Data Reviewed Labs: ordered.  Risk Prescription drug management.           Final Clinical Impression(s) / ED Diagnoses Final diagnoses:  None    Rx / DC Orders ED Discharge Orders     None         Julia Rings, PA-C 04/05/23 1916    Julia Mulders, MD 04/06/23 [phone]

## 2023-04-05 NOTE — ED Notes (Signed)
Patient wanded by security. Changed into BH scrubs. Room appropriate.

## 2023-04-05 NOTE — ED Notes (Signed)
Pt refused EKG at this time.

## 2023-04-06 DIAGNOSIS — F1994 Other psychoactive substance use, unspecified with psychoactive substance-induced mood disorder: Secondary | ICD-10-CM | POA: Diagnosis not present

## 2023-04-06 LAB — SARS CORONAVIRUS 2 BY RT PCR: SARS Coronavirus 2 by RT PCR: NEGATIVE

## 2023-04-06 MED ORDER — RISPERIDONE 1 MG PO TABS: 1.0000 mg | ORAL_TABLET | Freq: Every day | ORAL | Status: DC

## 2023-04-06 MED ORDER — FLUOXETINE HCL 20 MG PO CAPS
20.0000 mg | ORAL_CAPSULE | Freq: Every day | ORAL | Status: DC
  Administered 2023-04-06: 20 mg via ORAL
  Filled 2023-04-06: qty 1

## 2023-04-06 MED ORDER — CLONAZEPAM 0.5 MG PO TABS
0.5000 mg | ORAL_TABLET | Freq: Two times a day (BID) | ORAL | Status: DC
  Administered 2023-04-06: 0.5 mg via ORAL
  Filled 2023-04-06: qty 1

## 2023-04-06 MED ORDER — GABAPENTIN 100 MG PO CAPS
200.0000 mg | ORAL_CAPSULE | Freq: Two times a day (BID) | ORAL | Status: DC
  Administered 2023-04-06: 200 mg via ORAL
  Filled 2023-04-06: qty 2

## 2023-04-06 NOTE — ED Notes (Signed)
Negative COVID result sent to [facility].

## 2023-04-06 NOTE — ED Provider Notes (Signed)
Emergency Medicine Observation Re-evaluation Note  Julia [name] is a 22 y.o. female, seen on rounds today.  Pt initially presented to the ED for complaints of No chief complaint on file. Currently, the patient is IVC and awaiting placement by behavioral health for suicidal ideation.[name]  Physical Exam  BP 94/70 (BP Location: Left Arm)   Pulse (!) 106   Temp 98.3 F (36.8 C) (Oral)   Resp 18   Ht 1.651 m (5\' 5" )   Wt 84.4 kg   LMP  (LMP Unknown)   SpO2 99%   BMI 30.95 kg/m  Physical Exam General: Nontoxic no acute distress Cardiac:  Lungs: No respiratory distress Psych: Patient calm this morning.  ED Course / MDM  EKG:EKG Interpretation Date/Time:  Saturday April 05 2023 14:15:15 EDT Ventricular Rate:  98 PR Interval:  164 QRS Duration:  84 QT Interval:  352 QTC Calculation: 449 R Axis:   71  Text Interpretation: Normal sinus rhythm with sinus arrhythmia Normal ECG When compared with ECG of 29-Dec-2022 12:36, No significant change was found Confirmed by Dione Booze ([id number]) on 04/06/2023 3:05:45 AM  I have reviewed the labs performed to date as well as medications administered while in observation.  Recent changes in the last 24 hours include evaluation by behavioral health recommending inpatient admission..  Plan  Current plan is for inpatient admission.    Vanetta Mulders, MD 04/06/23 1055

## 2023-04-06 NOTE — ED Notes (Signed)
Pt denies SI and verbalizes auditory hallucinations.  Sts "it sounds like a ring."

## 2023-04-06 NOTE — ED Notes (Signed)
Attempted to call report to [facility] w/o answer x1.

## 2023-04-06 NOTE — ED Notes (Signed)
All belongings and paperwork given to [employer] officer.

## 2023-04-06 NOTE — ED Notes (Signed)
This Clinical research associate received a call from a Ms Julia [name] who sts the Pt has been living with her.  She sts "Julia [name] has several problems but she is not suicidal.  I have been dealing with her since 2020 and she has never wanted to harm/kill herself."

## 2023-04-06 NOTE — ED Notes (Signed)
Pt requesting home Klonopin.  EDP made aware.

## 2023-04-06 NOTE — Progress Notes (Signed)
LCSW Progress Note  [id number]   Julia [name]  04/06/2023  3:05 PM    Inpatient Behavioral Health Placement  Pt meets inpatient criteria per [facility]. There are no available beds within [facility] system per [facility] Brook McNichol,RN. Referral was sent to the following facilities;   Destination  Service Provider Address Phone [facility]  [address] [place], [place] [address] [phone]  [facility]  [address] [place] [address] [phone]  [facility]  [address] [place] [address] [phone]  [facility] [address]  [address] [place] [address] [phone]  [facility]  [address] [place], [place] [address] [phone]  [facility]  [address] [place] [address] [phone]  [facility]  [address] [place] [address] [phone]  [facility]  [address] [place], [place] [address] [phone]  [facility]  [address] [place] [address] [phone]  [facility]  [address] [place] [address] [phone]  [facility]  [address] [place] [address] [phone]  [facility]  [address] [place] [address] [phone]  [facility]  [address] [place] [address] [phone]  [facility]  [place] [phone]  [facility]  [address] [place] [address] [phone]  [facility] EFAX  [address] [name] [phone]  [facility]  [address], [place] [address] [phone]  [facility]  [address] [place] [address] [phone]  [facility]  [address], [place] [address] [phone]  [facility]  [address], [place] [address] [phone]  [facility]  [address], [place] [address] [phone]  [facility]  [address] [place], [place] [address] [phone]  [facility]  [address] [place] [address] [phone]  [facility]  [address] [place] [address] [phone]  [facility]  [place] [address] [phone]  [facility]  [address] [place] [address] [phone]  [facility] Pl[facility]  [facility], [place] [phone]  [facility]  [address], [place] [address] [phone]     Situation ongoing,  CSW will follow up.    Maryjean Ka, MSW, [facility] 04/06/2023 3:05 PM

## 2023-04-06 NOTE — Progress Notes (Signed)
Pt was accepted to [facility] TODAY 04/06/2023; Bed Assignment Cheron Every PENDING Negative COVID faxed to [facility] fax:[phone]   Pt meets inpatient criteria per [facility] Presto  Attending Physician will be Dr. Len Childs, MD  Report can be called to: [phone]  Pt can arrive after: BED IS READY PENDING Negative COVID   Care Team notified: Ophelia Shoulder, NP, [place]    Maryjean Ka, MSW, [facility] 04/06/2023 3:08 PM

## 2023-04-06 NOTE — Consult Note (Signed)
Telepsych Consultation   Reason for Consult:  Telepsych Assessment  Referring Physician:  Josem Kaufmann  Location of Patient:    [facility] Location of Provider: Other: [facility]  Patient Identification: ARGUSTA MIKKELSEN MRN:  [id number] Principal Diagnosis: Substance induced mood disorder (HCC) Diagnosis:  Principal Problem:   Substance induced mood disorder (HCC) Active Problems:   Polysubstance abuse (HCC)   Substance-induced disorder (HCC)   Schizoaffective disorder, depressive type (HCC)   Total Time spent with patient: 30 minutes  Subjective:   KATHYA NEMER is a 22 y.o. female patient admitted with  Per RN Triage Note Dated 04/05/2023@1308  "Pt BIB [facility] for emergency IVC, per officer pt was at [employer] and told them she was going to runaway and cut her wrist pt denies, but says she's being followed by "Gabriel Carina".   HPI:   [name], 22 y.o., female patient.  Patient seen via telepsych by this provider; chart reviewed and consulted with Dr. Jannifer Franklin on 04/06/23.  On evaluation MAIVEN MONTJOY is observed laying in bed with covers pulled to her neck. She is fairly groomed, hair uncombed; Patient greeted by this Clinical research associate and anticipatory guidance given.  When asked if she could sit up and open her eyes for assessment, pt heard making deep sigh, snatches the covers off herself and dangles at the bedside.  She appears visibly irritated.  When asked Stated mood is, "not that good" with anxious, irritable affect. She is alert/oriented x 4; Patient apprised this writer reviewed her chart but wanted to hear from her, her concerns for appropriate care disposition and medication adjustments.  Patient reports she's hearing voices, "just peoples voices" and she believes someone is out to get her.  While attempting to gather additional information about her audible hallucinations, she does deny command hallucinations; she reports the [facility] have been present for " a while."  When asked if she experiences VH after drug use, she become frustrated and refused to participate in assessment.  Patient observed, getting back in bed, pulling the covers up and closing her eyes.  Frequent failed attempt to re-engage were made by this Clinical research associate.   Labs: UDS is +for amphetamines, benzodiazepines and cocaine CMP is within normal limits; glucose is elevated at 101mg /dl but it's non-fasting CBC: does not show infection or anemia Pregnancy test is negative  During evaluation DOCIA MAGNIFICO is observed laying in bed with covers pulled to her neck. She is fairly groomed, hair uncombed; can see she's wearing hospital scrubs when she dangles at the bedside. She is alert/oriented x 4; Stated mood is, "not that good" with anxious, irritable affect. Patient is speaking in a clear tone at decreased volume, and normal pace; with fair eye contact.  Her thought process is linear; There is no indication that she is currently responding to internal/external stimuli or experiencing delusional thought content.  Patient endorses suicidal thoughts with plan to cut her wrist prior to admission, does not participate in safety assessment today.  She endorses paranoia and audible hallucinations, "just people's voices."   Patient abruptly stopped the assessment and refused to continue answering questions.     Per ED Provider Admission Assessment 04/05/2023@1251 : No chief complaint on file.     SHALANDRIA MICHALOWSKI is a 22 y.o. female.  Brought into the ER for psychiatric evaluation by [facility].  [name]  Please officer states they had found her wandering near at the [employer], she was stating that [employer] has  been following her but she did not seem to have other complaints so they were walking away and she stated that she was going to go "slit her wrists".  Per the officer.       Past Psychiatric History: as outlined below  Risk to Self:  yes Risk to Others:  deferred, as patient refused to answer  questions Prior Inpatient Therapy:  yes, was at [facility] 3 months ago Prior Outpatient Therapy:  deferred as pt refused to answer questions  Past Medical History:  Past Medical History:  Diagnosis Date   Burning with urination 05/03/2015   Contraceptive management 05/03/2015   Depression    Dysmenorrhea 12/30/2013   Heroin addiction (HCC)    Menorrhagia 12/30/2013   Menstrual extraction 12/30/2013   Migraines    Schizophrenia (HCC)    Social anxiety disorder 09/25/2016   Suicidal ideations    Vaginal odor 05/03/2015    Past Surgical History:  Procedure Laterality Date   NO PAST SURGERIES     Family History:  Family History  Problem Relation Age of Onset   Depression Mother    Hypertension Father    Hyperlipidemia Father    Cancer Paternal Grandmother        breast, uterine   Cirrhosis Paternal Grandfather        due to alcohol   Family Psychiatric  History: deferred Social History:  Social History   Substance and Sexual Activity  Alcohol Use Not Currently   Comment: socially     Social History   Substance and Sexual Activity  Drug Use Not Currently   Types: Methamphetamines   Comment: last used 10/07/21    Social History   Socioeconomic History   Marital status: Single    Spouse name: Not on file   Number of children: Not on file   Years of education: Not on file   Highest education level: Not on file  Occupational History   Occupation: Unemployed  Tobacco Use   Smoking status: Former    Current packs/day: 0.50    Average packs/day: 0.5 packs/day for 1 year (0.5 ttl pk-yrs)    Types: Cigarettes    Passive exposure: Past   Smokeless tobacco: Never  Vaping Use   Vaping status: Never Used  Substance and Sexual Activity   Alcohol use: Not Currently    Comment: socially   Drug use: Not Currently    Types: Methamphetamines    Comment: last used 10/07/21   Sexual activity: Yes    Birth control/protection: Condom  Other Topics Concern   Not on file  Social  History Narrative   Not on file   Social Determinants of Health   Financial Resource Strain: Not on file  Food Insecurity: No Food Insecurity (12/29/2022)   Hunger Vital Sign    Worried About Running Out of Food in the Last Year: Never true    Ran Out of Food in the Last Year: Never true  Transportation Needs: No Transportation Needs (12/29/2022)   PRAPARE - Administrator, Civil Service (Medical): No    Lack of Transportation (Non-Medical): No  Physical Activity: Not on file  Stress: Not on file  Social Connections: Not on file   Additional Social History:    Allergies:   Allergies  Allergen Reactions   Abilify [Aripiprazole] Other (See Comments)    EPS Jaw locked up   Haldol [Haloperidol] Other (See Comments)    EPS   Tylenol [Acetaminophen] Rash    Labs:  Results for orders placed or performed during the hospital encounter of 04/05/23 (from the past 48 hour(s))  Comprehensive metabolic panel     Status: Abnormal   Collection Time: 04/05/23  1:29 PM  Result Value Ref Range   Sodium 136 135 - 145 mmol/L   Potassium 3.8 3.5 - 5.1 mmol/L   Chloride 103 98 - 111 mmol/L   CO2 24 22 - 32 mmol/L   Glucose, Bld 101 (H) 70 - 99 mg/dL    Comment: Glucose reference range applies only to samples taken after fasting for at least 8 hours.   BUN 14 6 - 20 mg/dL   Creatinine, Ser 6.64 0.44 - 1.00 mg/dL   Calcium 9.2 8.9 - 40.3 mg/dL   Total Protein 7.6 6.5 - 8.1 g/dL   Albumin 4.1 3.5 - 5.0 g/dL   AST 18 15 - 41 U/L   ALT 21 0 - 44 U/L   Alkaline Phosphatase 85 38 - 126 U/L   Total Bilirubin 0.7 0.3 - 1.2 mg/dL   GFR, Estimated >47 >42 mL/min    Comment: (NOTE) Calculated using the CKD-EPI Creatinine Equation (2021)    Anion gap 9 5 - 15    Comment: Performed at [facility], [address], [place], [place] [address]  CBC with Diff     Status: None   Collection Time: 04/05/23  1:29 PM  Result Value Ref Range   WBC 7.4 4.0 - 10.5 K/uL   RBC 5.02 3.87 - 5.11  MIL/uL   Hemoglobin 14.2 12.0 - 15.0 g/dL   HCT 87.5 64.3 - 32.9 %   MCV 88.0 80.0 - 100.0 fL   MCH 28.3 26.0 - 34.0 pg   MCHC 32.1 30.0 - 36.0 g/dL   RDW 51.8 84.1 - 66.0 %   Platelets 282 150 - 400 K/uL   nRBC 0.0 0.0 - 0.2 %   Neutrophils Relative % 60 %   Neutro Abs 4.5 1.7 - 7.7 K/uL   Lymphocytes Relative 30 %   Lymphs Abs 2.3 0.7 - 4.0 K/uL   Monocytes Relative 7 %   Monocytes Absolute 0.5 0.1 - 1.0 K/uL   Eosinophils Relative 1 %   Eosinophils Absolute 0.1 0.0 - 0.5 K/uL   Basophils Relative 1 %   Basophils Absolute 0.0 0.0 - 0.1 K/uL   Immature Granulocytes 1 %   Abs Immature Granulocytes 0.04 0.00 - 0.07 K/uL    Comment: Performed at [facility], [address], [place], [place] [address]  Urine rapid drug screen (hosp performed)     Status: Abnormal   Collection Time: 04/05/23  6:02 PM  Result Value Ref Range   Opiates NONE DETECTED NONE DETECTED   Cocaine POSITIVE (A) NONE DETECTED   Benzodiazepines POSITIVE (A) NONE DETECTED   Amphetamines POSITIVE (A) NONE DETECTED   Tetrahydrocannabinol NONE DETECTED NONE DETECTED   Barbiturates NONE DETECTED NONE DETECTED    Comment: (NOTE) DRUG SCREEN FOR MEDICAL PURPOSES ONLY.  IF CONFIRMATION IS NEEDED FOR ANY PURPOSE, NOTIFY LAB WITHIN 5 DAYS.  LOWEST DETECTABLE LIMITS FOR URINE DRUG SCREEN Drug Class                     Cutoff (ng/mL) Amphetamine and metabolites    1000 Barbiturate and metabolites    200 Benzodiazepine                 200 Opiates and metabolites        300 Cocaine and  metabolites        300 THC                            50 Performed at [facility], [address] [place], [place] [address]   POC urine preg, ED     Status: None   Collection Time: 04/05/23  6:03 PM  Result Value Ref Range   Preg Test, Ur NEGATIVE NEGATIVE    Comment:        THE SENSITIVITY OF THIS METHODOLOGY IS >24 mIU/mL     Medications:  Current Facility-Administered Medications  Medication Dose Route Frequency  Provider Last Rate Last Admin   clonazePAM (KLONOPIN) tablet 0.5 mg  0.5 mg Oral BID Ophelia Shoulder E, NP   0.5 mg at 04/06/23 1149   FLUoxetine (PROZAC) capsule 20 mg  20 mg Oral Daily Ophelia Shoulder E, NP   20 mg at 04/06/23 1149   gabapentin (NEURONTIN) capsule 200 mg  200 mg Oral BID Ophelia Shoulder E, NP   200 mg at 04/06/23 1150   risperiDONE (RISPERDAL) tablet 1 mg  1 mg Oral QHS Chales Abrahams, NP       Current Outpatient Medications  Medication Sig Dispense Refill   acetaminophen (TYLENOL) 500 MG tablet Take 500 mg by mouth every 6 (six) hours as needed for moderate pain.     clonazePAM (KLONOPIN) 0.5 MG tablet Take 0.5 mg by mouth 2 (two) times daily.     FLUoxetine (PROZAC) 20 MG capsule Take 1 capsule (20 mg total) by mouth daily. 30 capsule 0   risperiDONE (RISPERDAL) 3 MG tablet Take 1 tablet (3 mg total) by mouth at bedtime. 30 tablet 0    Musculoskeletal: pt moves all extremities and ambulates independently. Strength & Muscle Tone: within normal limits Gait & Station: normal Patient leans: N/A    Psychiatric Specialty Exam:  Presentation  General Appearance:  Fairly Groomed  Eye Contact: Fair  Speech: Normal Rate; Clear and Coherent  Speech Volume: Decreased  Handedness: Right   Mood and Affect  Mood: Anxious; Irritable  Affect: Appropriate   Thought Process  Thought Processes: Linear  Descriptions of Associations:Intact  Orientation:Full (Time, Place and Person)  Thought Content:Logical  History of Schizophrenia/Schizoaffective disorder:Yes  Duration of Psychotic Symptoms:Greater than six months  Hallucinations:Hallucinations: Auditory Description of Auditory Hallucinations: "just people's voices"  Ideas of Reference:Paranoia  Suicidal Thoughts:Suicidal Thoughts: -- (endorses suicidal thought and plan to cut her wrist prior to admission; refuses to participate in assessment today)  Homicidal Thoughts:Homicidal Thoughts:  No   Sensorium  Memory: Immediate Good; Recent Fair; Remote Fair  Judgment: Poor  Insight: Poor   Executive Functions  Concentration: Fair  Attention Span: Fair  Recall: Fair  Fund of Knowledge: Fair  Language: Good   Psychomotor Activity  Psychomotor Activity:Psychomotor Activity: Normal   Assets  Assets: Desire for Improvement; Financial Resources/Insurance   Sleep  Sleep:Sleep: Fair Number of Hours of Sleep: 6    Physical Exam: Physical Exam Cardiovascular:     Rate and Rhythm: Normal rate.     Pulses: Normal pulses.  Musculoskeletal:        General: Normal range of motion.     Cervical back: Normal range of motion.  Neurological:     Mental Status: She is alert and oriented to person, place, and time.  Psychiatric:        Attention and Perception: Attention normal.  Mood and Affect: Mood is anxious. Affect is blunt and angry.        Speech: Speech normal.        Behavior: Behavior is uncooperative and agitated.        Thought Content: Thought content includes suicidal ideation. Thought content includes suicidal (prior to admission she threatened to cut her wrist; does not answer safety questions today.) plan.        Cognition and Memory: Cognition and memory normal.        Judgment: Judgment is impulsive and inappropriate.    Review of Systems  Reason unable to perform ROS: people refused to answer questions.  Psychiatric/Behavioral:  Positive for hallucinations (audible hallucinations of "just people talking" no command hallucinations) and substance abuse. The patient is nervous/anxious.    Blood pressure 94/70, pulse (!) 106, temperature 98.3 F (36.8 C), temperature source Oral, resp. rate 18, height 5\' 5"  (1.651 m), weight 84.4 kg, SpO2 99%. Body mass index is 30.95 kg/m.  Treatment Plan Summary: Patient with hx of schizoaffective disorder,and polysubstance abuse who presented to the hospital escorted by the police for  verbalizing suicidal ideation and a plan to cut her wrist.  Her admission UDS was positive for amphetamines, benzodiazepines, and cocaine. Of note, per PDMP she is prescribed clonazepam 0.5mg  po BID prn anxiety which explains her +benzos.  Attempted to see patient today for psychiatric assessment, she is alert and oriented x4; her speech is clear and coherent, not pressured or rushed; she continues to endorse paranoid and audible hallucinations, denies command in nature. However, assessment was cut short by patient who abruptly verbalized her irritation with being asked "redundant questions that are stupid" and refused to continue with the assessment.      Patient is known to our facility for frequent emergency room visits, has been seen 6x within the past year, for hallucinations, anxiety, suicidal ideation.  Her most recent [facility] hospitalization was 3 months ago. She also has a hx for polysubstance abuse, drug use and medication non compliance appears to exacerbates her mental health concerns.  During the brief encounter today, her presentation appears related to substance induced mood disorder.  However, in the setting of her verbalizing suicidal thoughts with plan to cut her wrist (prior to admission), her current level of agitation and refusing with answering questions, she is impulsive and does lacks good decision making capacity.  Considering above, will continue to recommend inpatient psych admission. She is under IVC, which I believe remains appropriate for now. SW has already faxed her out.  Daily contact with patient to assess and evaluate symptoms and progress in treatment and Medication management   EKG: QT/QTC intervals 352/449 Recommend restating home medications as follows: Risperidone 3mg  po at bedtime is ordered but ordered Risperidone 1mg  po at bedtime as unsure how long she's been off medication (schizoaffective disorder) Fluoxetine 20mg  po daily for depression Clonazepam 0.5mg  po BID prn  for anxiety Gabapentin 200mg  po BID   Disposition: Recommend psychiatric Inpatient admission when medically cleared. Per [facility], there are no appropriate bed, patient has been faxed out.    This service was provided via telemedicine using a 2-way, interactive audio and video technology.  Names of all persons participating in this telemedicine service and their role in this encounter. Name: Yozelin Severo Role: Patient  Name: Ophelia Shoulder Role: PMHNP  Name: Thedore Mins Role: Psychiatrist    Chales Abrahams, NP 04/06/2023 2:27 PM

## 2023-04-06 NOTE — ED Notes (Signed)
Nurse Secretary asked to call [employer] for transport.

## 2023-04-06 NOTE — ED Provider Notes (Signed)
Has been accepted at [facility] [place], [place] form completed, stable for transfer   Eber Hong, MD 04/06/23 1745

## 2023-04-06 NOTE — ED Notes (Signed)
Spoke to Ms Julia [name] and she will be here prior to 1730 w/ clothing for the Pt to take with her to [facility].  She was spoken to w/ the patient's permission.

## 2023-05-17 ENCOUNTER — Other Ambulatory Visit: Payer: Self-pay

## 2023-05-17 ENCOUNTER — Emergency Department (HOSPITAL_COMMUNITY)
Admission: EM | Admit: 2023-05-17 | Discharge: 2023-05-19 | Disposition: A | Discharge status: Home / Self Care | Payer: BC Managed Care – PPO | Attending: Emergency Medicine | Admitting: Emergency Medicine

## 2023-05-17 DIAGNOSIS — F1410 Cocaine abuse, uncomplicated: Secondary | ICD-10-CM | POA: Diagnosis not present

## 2023-05-17 DIAGNOSIS — F1910 Other psychoactive substance abuse, uncomplicated: Secondary | ICD-10-CM | POA: Diagnosis present

## 2023-05-17 DIAGNOSIS — F19959 Other psychoactive substance use, unspecified with psychoactive substance-induced psychotic disorder, unspecified: Secondary | ICD-10-CM | POA: Diagnosis not present

## 2023-05-17 DIAGNOSIS — E876 Hypokalemia: Secondary | ICD-10-CM | POA: Insufficient documentation

## 2023-05-17 DIAGNOSIS — F29 Unspecified psychosis not due to a substance or known physiological condition: Secondary | ICD-10-CM

## 2023-05-17 DIAGNOSIS — F251 Schizoaffective disorder, depressive type: Secondary | ICD-10-CM | POA: Diagnosis present

## 2023-05-17 DIAGNOSIS — Z87891 Personal history of nicotine dependence: Secondary | ICD-10-CM | POA: Insufficient documentation

## 2023-05-17 DIAGNOSIS — F1510 Other stimulant abuse, uncomplicated: Secondary | ICD-10-CM | POA: Diagnosis not present

## 2023-05-17 DIAGNOSIS — R4689 Other symptoms and signs involving appearance and behavior: Secondary | ICD-10-CM | POA: Diagnosis present

## 2023-05-17 LAB — CBC WITH DIFFERENTIAL/PLATELET
Abs Immature Granulocytes: 0.07 10*3/uL (ref 0.00–0.07)
Basophils Absolute: 0 10*3/uL (ref 0.0–0.1)
Basophils Relative: 0 %
Eosinophils Absolute: 0 10*3/uL (ref 0.0–0.5)
Eosinophils Relative: 0 %
HCT: 42.4 % (ref 36.0–46.0)
Hemoglobin: 13.3 g/dL (ref 12.0–15.0)
Immature Granulocytes: 1 %
Lymphocytes Relative: 23 %
Lymphs Abs: 3 10*3/uL (ref 0.7–4.0)
MCH: 27.5 pg (ref 26.0–34.0)
MCHC: 31.4 g/dL (ref 30.0–36.0)
MCV: 87.6 fL (ref 80.0–100.0)
Monocytes Absolute: 1.2 10*3/uL — ABNORMAL HIGH (ref 0.1–1.0)
Monocytes Relative: 9 %
Neutro Abs: 8.7 10*3/uL — ABNORMAL HIGH (ref 1.7–7.7)
Neutrophils Relative %: 67 %
Platelets: 297 10*3/uL (ref 150–400)
RBC: 4.84 MIL/uL (ref 3.87–5.11)
RDW: 13.9 % (ref 11.5–15.5)
WBC: 13 10*3/uL — ABNORMAL HIGH (ref 4.0–10.5)
nRBC: 0 % (ref 0.0–0.2)

## 2023-05-17 LAB — COMPREHENSIVE METABOLIC PANEL
ALT: 17 U/L (ref 0–44)
AST: 21 U/L (ref 15–41)
Albumin: 4.1 g/dL (ref 3.5–5.0)
Alkaline Phosphatase: 84 U/L (ref 38–126)
Anion gap: 15 (ref 5–15)
BUN: 12 mg/dL (ref 6–20)
CO2: 20 mmol/L — ABNORMAL LOW (ref 22–32)
Calcium: 9.6 mg/dL (ref 8.9–10.3)
Chloride: 105 mmol/L (ref 98–111)
Creatinine, Ser: 0.86 mg/dL (ref 0.44–1.00)
GFR, Estimated: >60 mL/min (ref >60)
Glucose, Bld: 96 mg/dL (ref 70–99)
Potassium: 3.1 mmol/L — ABNORMAL LOW (ref 3.5–5.1)
Sodium: 140 mmol/L (ref 135–145)
Total Bilirubin: 0.6 mg/dL (ref 0.3–1.2)
Total Protein: 7.9 g/dL (ref 6.5–8.1)

## 2023-05-17 LAB — RAPID URINE DRUG SCREEN, HOSP PERFORMED
Amphetamines: POSITIVE — AB
Barbiturates: NOT DETECTED
Benzodiazepines: NOT DETECTED
Cocaine: POSITIVE — AB
Opiates: NOT DETECTED
Tetrahydrocannabinol: NOT DETECTED

## 2023-05-17 LAB — ACETAMINOPHEN LEVEL: Acetaminophen (Tylenol), Serum: <10 ug/mL — ABNORMAL LOW (ref 10–30)

## 2023-05-17 LAB — ETHANOL: Alcohol, Ethyl (B): <10 mg/dL (ref <10)

## 2023-05-17 LAB — POC URINE PREG, ED: Preg Test, Ur: NEGATIVE

## 2023-05-17 LAB — SALICYLATE LEVEL: Salicylate Lvl: <7 mg/dL — ABNORMAL LOW (ref 7.0–30.0)

## 2023-05-17 MED ORDER — CLONAZEPAM 0.5 MG PO TABS
1.0000 mg | ORAL_TABLET | Freq: Once | ORAL | Status: AC
  Administered 2023-05-17: 1 mg via ORAL
  Filled 2023-05-17: qty 2

## 2023-05-17 MED ORDER — RISPERIDONE 1 MG PO TABS
3.0000 mg | ORAL_TABLET | Freq: Every day | ORAL | Status: DC
  Administered 2023-05-18: 3 mg via ORAL
  Filled 2023-05-17: qty 3

## 2023-05-17 MED ORDER — ZIPRASIDONE MESYLATE 20 MG IM SOLR
20.0000 mg | Freq: Once | INTRAMUSCULAR | Status: AC
  Administered 2023-05-17: 20 mg via INTRAMUSCULAR
  Filled 2023-05-17: qty 20

## 2023-05-17 MED ORDER — CLONAZEPAM 0.5 MG PO TABS
0.5000 mg | ORAL_TABLET | Freq: Once | ORAL | Status: AC
  Administered 2023-05-17: 0.5 mg via ORAL
  Filled 2023-05-17: qty 1

## 2023-05-17 MED ORDER — LORAZEPAM 1 MG PO TABS
1.0000 mg | ORAL_TABLET | Freq: Once | ORAL | Status: AC
  Administered 2023-05-17: 1 mg via ORAL
  Filled 2023-05-17: qty 1

## 2023-05-17 MED ORDER — STERILE WATER FOR INJECTION IJ SOLN
INTRAMUSCULAR | Status: AC
  Administered 2023-05-17: 10 mL via INTRAMUSCULAR
  Filled 2023-05-17: qty 10

## 2023-05-17 MED ORDER — POTASSIUM CHLORIDE CRYS ER 20 MEQ PO TBCR
40.0000 meq | EXTENDED_RELEASE_TABLET | Freq: Once | ORAL | Status: AC
  Administered 2023-05-17: 40 meq via ORAL
  Filled 2023-05-17: qty 2

## 2023-05-17 NOTE — BH Assessment (Signed)
TTS attempted to see will follow up with Pt's nurse. 

## 2023-05-17 NOTE — ED Notes (Signed)
Pt is extremely agitated and confused. Pt is unaware of her surroundings and becomes extremely loud and violent.

## 2023-05-17 NOTE — ED Notes (Signed)
Pt wanded by security. 

## 2023-05-17 NOTE — ED Notes (Signed)
[employer] at bedside

## 2023-05-17 NOTE — ED Provider Notes (Signed)
[place] EMERGENCY DEPARTMENT AT [facility] Provider Note  CSN: [id number] Arrival date & time: 05/17/23 5366  Chief Complaint(s) Medical Clearance  HPI Julia [name] is a 22 y.o. female with history of schizophrenia, polysubstance abuse presenting to the emergency department with abnormal behavior.  The patient was found at a gas station by the police.  He is apparently chasing someone around a gas pump and then went in at the store and then was chasing people out of the store.  She was telling the police that she was a Insurance claims handler and the god of death.  Here the patient is really unable to provide any further significant history.  History limited due to psychiatric condition, altered mental status   Past Medical History Past Medical History:  Diagnosis Date   Burning with urination 05/03/2015   Contraceptive management 05/03/2015   Depression    Dysmenorrhea 12/30/2013   Heroin addiction (HCC)    Menorrhagia 12/30/2013   Menstrual extraction 12/30/2013   Migraines    Schizophrenia (HCC)    Social anxiety disorder 09/25/2016   Suicidal ideations    Vaginal odor 05/03/2015   Patient Active Problem List   Diagnosis Date Noted   Schizoaffective disorder, depressive type (HCC) 12/29/2022   Suicidal ideations    Substance-induced disorder (HCC) 04/27/2021   Malingering 06/02/2020   Homicidal ideation 06/02/2020   Paranoia (HCC) 06/02/2020   Suicidal behavior 06/02/2020   Schizophrenia (HCC) 06/02/2020   Schizoaffective disorder (HCC) 05/08/2020   Schizophrenia, paranoid (HCC)    Cocaine dependence with cocaine-induced psychotic disorder with complication (HCC)    Social anxiety disorder 09/25/2016   Substance induced mood disorder (HCC) 04/29/2016   Polysubstance abuse (HCC)    Dysmenorrhea 12/30/2013   Home Medication(s) Prior to Admission medications   Medication Sig Start Date End Date Taking? Authorizing Provider  acetaminophen (TYLENOL) 500 MG  tablet Take 500 mg by mouth every 6 (six) hours as needed for moderate pain.    [provider]  clonazePAM (KLONOPIN) 0.5 MG tablet Take 0.5 mg by mouth 2 (two) times daily.    [provider]  FLUoxetine (PROZAC) 20 MG capsule Take 1 capsule (20 mg total) by mouth daily. 01/03/23 04/05/23  Princess Bruins, DO  risperiDONE (RISPERDAL) 3 MG tablet Take 1 tablet (3 mg total) by mouth at bedtime. 01/03/23 04/05/23  Princess Bruins, DO                                                                                                                                    Past Surgical History Past Surgical History:  Procedure Laterality Date   NO PAST SURGERIES     Family History Family History  Problem Relation Age of Onset   Depression Mother    Hypertension Father    Hyperlipidemia Father    Cancer Paternal Grandmother        breast, uterine  Cirrhosis Paternal Grandfather        due to alcohol    Social History Social History   Tobacco Use   Smoking status: Former    Current packs/day: 0.50    Average packs/day: 0.5 packs/day for 1 year (0.5 ttl pk-yrs)    Types: Cigarettes    Passive exposure: Past   Smokeless tobacco: Never  Vaping Use   Vaping status: Never Used  Substance Use Topics   Alcohol use: Not Currently    Comment: socially   Drug use: Not Currently    Types: Methamphetamines    Comment: last used 10/07/21   Allergies Abilify [aripiprazole], Haldol [haloperidol], and Tylenol [acetaminophen]  Review of Systems Review of Systems  All other systems reviewed and are negative.   Physical Exam Vital Signs  I have reviewed the triage vital signs BP 124/86 (BP Location: Left Arm)   Pulse (!) 105   Temp 98.7 F (37.1 C) (Oral)   Resp (!) 24   Ht 5\' 6"  (1.676 m)   Wt 83 kg   SpO2 99%   BMI 29.54 kg/m  Physical Exam Vitals and nursing note reviewed.  Constitutional:      General: She is not in acute distress.    Appearance: She is well-developed.   HENT:     Head: Normocephalic and atraumatic.     Mouth/Throat:     Mouth: Mucous membranes are moist.  Eyes:     Pupils: Pupils are equal, round, and reactive to light.  Cardiovascular:     Rate and Rhythm: Normal rate and regular rhythm.     Heart sounds: No murmur heard. Pulmonary:     Effort: Pulmonary effort is normal. No respiratory distress.     Breath sounds: Normal breath sounds.  Abdominal:     General: Abdomen is flat.     Palpations: Abdomen is soft.     Tenderness: There is no abdominal tenderness.  Musculoskeletal:        General: No tenderness.     Right lower leg: No edema.     Left lower leg: No edema.  Skin:    General: Skin is warm and dry.  Neurological:     General: No focal deficit present.     Mental Status: She is alert. Mental status is at baseline.  Psychiatric:        Attention and Perception: She is inattentive. She perceives auditory hallucinations.        Mood and Affect: Mood normal. Affect is labile and angry.        Speech: Speech is rapid and pressured and tangential.        Behavior: Behavior is uncooperative, agitated and hyperactive.        Thought Content: Thought content is paranoid and delusional. Thought content does not include homicidal or suicidal ideation.     ED Results and Treatments Labs (all labs ordered are listed, but only abnormal results are displayed) Labs Reviewed  COMPREHENSIVE METABOLIC PANEL - Abnormal; Notable for the following components:      Result Value   Potassium 3.1 (*)    CO2 20 (*)    All other components within normal limits  CBC WITH DIFFERENTIAL/PLATELET - Abnormal; Notable for the following components:   WBC 13.0 (*)    Neutro Abs 8.7 (*)    Monocytes Absolute 1.2 (*)    All other components within normal limits  ACETAMINOPHEN LEVEL - Abnormal; Notable for the following components:   Acetaminophen (Tylenol),  Serum <10 (*)    All other components within normal limits  SALICYLATE LEVEL -  Abnormal; Notable for the following components:   Salicylate Lvl <7.0 (*)    All other components within normal limits  ETHANOL  RAPID URINE DRUG SCREEN, HOSP PERFORMED  POC URINE PREG, ED                                                                                                                          Radiology No results found.  Pertinent labs & imaging results that were available during my care of the patient were reviewed by me and considered in my medical decision making (see MDM for details).  Medications Ordered in ED Medications  potassium chloride SA (KLOR-CON M) CR tablet 40 mEq (has no administration in time range)  ziprasidone (GEODON) injection 20 mg (20 mg Intramuscular Given 05/17/23 0851)  sterile water (preservative free) injection (10 mLs Intramuscular Given 05/17/23 0850)  clonazePAM (KLONOPIN) tablet 0.5 mg (0.5 mg Oral Given 05/17/23 1019)                                                                                                                                     Procedures Procedures  (including critical care time)  Medical Decision Making / ED Course   MDM:  22 year old female presenting with altered mental status, behavior change.  Symptoms seem consistent with patient's chronic schizophrenia.  She does appear to be reacting to internal stimuli and seems quite agitated.  Her physical exam is otherwise unremarkable.  Vitals with minimal tachycardia.  She received Geodon.  Will obtain medical screening labs.  She will need psychiatric clearance if laboratory testing is all reassuring.  Clinical Course as of 05/17/23 1217  Sat May 17, 2023  1213 Labs reassuring. Patient has calmed down. Medically clear for TTS eval. Placed on IVC [WS]    Clinical Course User Index [WS] Lonell Grandchild, MD     Additional history obtained: -Additional history obtained from police officer -External records from outside source obtained and reviewed  including: Chart review including previous notes, labs, imaging, consultation notes including previous er visits for same    Lab Tests: -I ordered, reviewed, and interpreted labs.   The pertinent results include:   Labs Reviewed  COMPREHENSIVE METABOLIC PANEL - Abnormal; Notable for the following components:      Result Value  Potassium 3.1 (*)    CO2 20 (*)    All other components within normal limits  CBC WITH DIFFERENTIAL/PLATELET - Abnormal; Notable for the following components:   WBC 13.0 (*)    Neutro Abs 8.7 (*)    Monocytes Absolute 1.2 (*)    All other components within normal limits  ACETAMINOPHEN LEVEL - Abnormal; Notable for the following components:   Acetaminophen (Tylenol), Serum <10 (*)    All other components within normal limits  SALICYLATE LEVEL - Abnormal; Notable for the following components:   Salicylate Lvl <7.0 (*)    All other components within normal limits  ETHANOL  RAPID URINE DRUG SCREEN, HOSP PERFORMED  POC URINE PREG, ED    Notable for mild hypokalemia and leukocytosis, nonspecific   Medicines ordered and prescription drug management: Meds ordered this encounter  Medications   ziprasidone (GEODON) injection 20 mg   sterile water (preservative free) injection    Arby Barrette M: cabinet override   clonazePAM (KLONOPIN) tablet 0.5 mg   potassium chloride SA (KLOR-CON M) CR tablet 40 mEq    -I have reviewed the patients home medicines and have made adjustments as needed   Consultations Obtained: I requested consultation with the TTS,  and discussed lab and imaging findings as well as pertinent plan - they recommend: pending   Cardiac Monitoring: The patient was maintained on a cardiac monitor.  I personally viewed and interpreted the cardiac monitored which showed an underlying rhythm of: NSR  Social Determinants of Health:  Diagnosis or treatment significantly limited by social determinants of health: obesity   Reevaluation: After the  interventions noted above, I reevaluated the patient and found that their symptoms have improved  Co morbidities that complicate the patient evaluation  Past Medical History:  Diagnosis Date   Burning with urination 05/03/2015   Contraceptive management 05/03/2015   Depression    Dysmenorrhea 12/30/2013   Heroin addiction (HCC)    Menorrhagia 12/30/2013   Menstrual extraction 12/30/2013   Migraines    Schizophrenia (HCC)    Social anxiety disorder 09/25/2016   Suicidal ideations    Vaginal odor 05/03/2015      Dispostion: Disposition decision including need for hospitalization was considered, and patient boarding for TTS evaluation.     Final Clinical Impression(s) / ED Diagnoses Final diagnoses:  Psychosis, unspecified psychosis type (HCC)     This chart was dictated using voice recognition software.  Despite best efforts to proofread,  errors can occur which can change the documentation meaning.    Lonell Grandchild, MD 05/17/23 1217

## 2023-05-17 NOTE — BH Assessment (Addendum)
Comprehensive Clinical Assessment (CCA) Note  05/17/2023 Julia [name] [id number]  DISPOSITION: White NP recommends patient be observed and monitored. Patient will be seen on 11/3 to re-evaluate.   The patient demonstrates the following risk factors for suicide: Chronic risk factors for suicide include: N/A. Acute risk factors for suicide include: N/A. Protective factors for this patient include: coping skills. Considering these factors, the overall suicide risk at this point appears to be low. Patient is appropriate for outpatient follow up.    Patient is a 22 year old single female who presents to [facility] ED after being transported by Patent examiner with IVC after patient was found in the community with AMS "chasing people around the parking lot at a gas station stating she was the Diplomatic Services operational officer of defense." Patient is observed to be altered and renders limited history at the time of this writing. Patient denies any S/I, H/I but reports ongoing AVH. Patient will not elaborate on content of her hallucinations and when asked to describe them stated, "no, no ,no." Per chart review patient has a history significant for Schizoaffective Disorder and had been receiving OP services from [facility] in [place] although when patient was asked this date to verify stated, "she hasn't heard of them."    Patient is observed to be altered and will not respond to most questions associated with assessment. Patient was seen on 9/21 when she presented with similar symptoms. Patient denies any SA issues this date although is positive for cocaine and amphetamines.    Medical record indicates she has received ACTT services in the past but she denies current services. Patient's medical record indicates she has been psychiatrically hospitalized at least three times in [place] and as mentioned above seen on 9/21 and 6/15 when she met inpatient criteria at that time.    Patient denies alcohol or other substance use although  as stated above tested positive for multiple substances. Medical record indicates a history of using alcohol and cocaine in the past also.    Patient appears actively psychotic and delusional at the time of this writing. She cannot identify any stressors and talks at random about topics unrelated to the assessment.      Patient is dressed in hospital scrubs, and will not respond to orientation questions. Patient is observed to be tearful, crying and renders limited history. Patient's memory appears to be intact although thoughts disorganized. Patient's mood is agitated/angry with affect congruent. It is unclear if patient is responding to internal stimuli.       Chief Complaint:  Chief Complaint  Patient presents with   Medical Clearance   Visit Diagnosis: Schizoaffective D/O     CCA Screening, Triage and Referral (STR)  Patient Reported Information How did you hear about [place]? Legal System  What Is the Reason for Your Visit/Call Today? Julia [name] is a 22 y.o. female with history of schizophrenia, polysubstance abuse presenting to the emergency department with abnormal behavior.  The patient was found at a gas station by the police.  He is apparently chasing someone around a gas pump and then went in at the store and then was chasing people out of the store.  She was telling the police that she was a Insurance claims handler and the god of death.  Here the patient is really unable to provide any further significant history.  History limited due to psychiatric condition, altered mental status  How Long Has This Been Causing You Problems? <Week  What Do You Feel  Would Help You the Most Today? Treatment for Depression or other mood problem   Have You Recently Had Any Thoughts About Hurting Yourself? No  Are You Planning to Commit Suicide/Harm Yourself At This time? No   Flowsheet Row ED from 05/17/2023 in [facility] Emergency Department at [facility] ED from 04/05/2023 in [facility] Emergency Department at [facility] ED from 03/01/2023 in [facility] Emergency Department at [facility]  C-SSRS RISK CATEGORY No Risk High Risk No Risk       Have you Recently Had Thoughts About Hurting Someone [name]? No  Are You Planning to Harm Someone at This Time? No  Explanation: NA   Have You Used Any Alcohol or Drugs in the Past 24 Hours? No  What Did You Use and How Much? Pt denies but UDS is positive for cocaine and amphetamines   Do You Currently Have a Therapist/Psychiatrist? No  Name of Therapist/Psychiatrist: Name of Therapist/Psychiatrist: Pt states she is currently not receiving services   Have You Been Recently Discharged From Any Office Practice or Programs? No  Explanation of Discharge From Practice/Program: NA     CCA Screening Triage Referral Assessment Type of Contact: Tele-Assessment  Telemedicine Service Delivery: Telemedicine service delivery: This service was provided via telemedicine using a 2-way, interactive audio and video technology  Is this Initial or Reassessment? Is this Initial or Reassessment?: Initial Assessment  Date Telepsych consult ordered in [facility]:  Date Telepsych consult ordered in [facility]: 05/17/23  Time Telepsych consult ordered in [facility]:  Time Telepsych consult ordered in [facility]: 1300  Location of Assessment: [facility] ED  Provider Location: [facility] Assessment Services   Collateral Involvement: None at this time   Does Patient Have a Automotive engineer Guardian? No  Legal Guardian Contact Information: NA  Copy of Legal Guardianship Form: -- (NA)  Legal Guardian Notified of Arrival: -- (NA)  Legal Guardian Notified of Pending Discharge: -- (NA)  If Minor and Not Living with Parent(s), Who has Custody? NA  Is CPS involved or ever been involved? Never  Is APS involved or ever been involved? Never   Patient Determined To Be At Risk for Harm To Self or Others Based on Review of Patient Reported Information  or Presenting Complaint? No  Method: No Plan  Availability of Means: No access or NA  Intent: Vague intent or NA  Notification Required: No need or identified person  Additional Information for Danger to Others Potential: -- (NA)  Additional Comments for Danger to Others Potential: NA  Are There Guns or Other Weapons in Your Home? No  Types of Guns/Weapons: NA  Are These Weapons Safely Secured?                            -- (NA)  Who Could Verify You Are Able To Have These Secured: NA  Do You Have any Outstanding Charges, Pending Court Dates, Parole/Probation? Pt denies  Contacted To Inform of Risk of Harm To Self or Others: Other: Comment (NA)    Does Patient Present under Involuntary Commitment? Yes    [place] of Residence: [place]   Patient Currently Receiving the Following Services: Not Receiving Services   Determination of Need: Urgent (48 hours)   Options For Referral: Other: Comment (Observation)     CCA Biopsychosocial Patient Reported Schizophrenia/Schizoaffective Diagnosis in Past: Yes   Strengths: UTA   Mental Health Symptoms Depression:   None  Duration of Depressive symptoms:    Mania:   None   Anxiety:    Irritability; Difficulty concentrating   Psychosis:   Delusions; Hallucinations   Duration of Psychotic symptoms:  Duration of Psychotic Symptoms: Less than six months   Trauma:   None   Obsessions:   None   Compulsions:   None   Inattention:   None   Hyperactivity/Impulsivity:   None   Oppositional/Defiant Behaviors:   None   Emotional Irregularity:   None   Other Mood/Personality Symptoms:   None noted    Mental Status Exam Appearance and self-care  Stature:   Average   Weight:   Average weight   Clothing:   -- (Scrubs)   Grooming:   Neglected   Cosmetic use:   Age appropriate   Posture/gait:   Bizarre   Motor activity:   Not Remarkable; Agitated   Sensorium  Attention:    Inattentive; Distractible; Confused   Concentration:   Variable; Scattered; Preoccupied   Orientation:   -- (UTA pt declined to answer)   Recall/memory:   -- (UTA)   Affect and Mood  Affect:   Anxious   Mood:   Irritable; Angry; Anxious   Relating  Eye contact:   Avoided   Facial expression:   Anxious; Angry   Attitude toward examiner:   Resistant; Uninterested; Argumentative; Threatening   Thought and Language  Speech flow:  Pressured   Thought content:   Delusions   Preoccupation:   None   Hallucinations:   Auditory; Visual   Organization:   Disorganized   Company secretary of Knowledge:   Poor   Intelligence:   Average   Abstraction:   Armed forces technical officer:   Impaired   Reality Testing:   Distorted   Insight:   Lacking   Decision Making:   Impulsive   Social Functioning  Social Maturity:   Irresponsible; Impulsive   Social Judgement:   Normal   Stress  Stressors:   -- (UTA)   Coping Ability:   -- Industrial/product designer)   Skill Deficits:   Decision making   Supports:   Support needed     Religion: Religion/Spirituality Are You A Religious Person?: No How Might This Affect Treatment?: N/a  Leisure/Recreation: Leisure / Recreation Do You Have Hobbies?: No  Exercise/Diet: Exercise/Diet Do You Exercise?: No Have You Gained or Lost A Significant Amount of Weight in the Past Six Months?: No Do You Follow a Special Diet?: No Do You Have Any Trouble Sleeping?: No   CCA Employment/Education Employment/Work Situation: Employment / Work Situation Employment Situation: Unemployed Patient's Job has Been Impacted by Current Illness: No Has Patient ever Been in Equities trader?: No  Education: Education Is Patient Currently Attending School?: No Last Grade Completed: 12 Did You Product manager?: No Did You Have An Individualized Education Program (IIEP): No Did You Have Any Difficulty At [employer]?: No Patient's Education Has  Been Impacted by Current Illness: No   CCA Family/Childhood History Family and Relationship History: Family history Marital status: Single Does patient have children?: No  Childhood History:  Childhood History By whom was/is the patient raised?: Both parents Did patient suffer any verbal/emotional/physical/sexual abuse as a child?: No Did patient suffer from severe childhood neglect?: No Has patient ever been sexually abused/assaulted/raped as an adolescent or adult?: No Was the patient ever a victim of a crime or a disaster?: No Witnessed domestic violence?: No Has patient been affected by domestic violence as an adult?:  No       CCA Substance Use Alcohol/Drug Use: Alcohol / Drug Use Pain Medications: See MAR Prescriptions: See MAR Over the Counter: See MAR History of alcohol / drug use?: No history of alcohol / drug abuse (Pt denies although UDS is positive for cocaine and amphetamines this date) Longest period of sobriety (when/how long): Unknown Negative Consequences of Use:  (None) Withdrawal Symptoms:  (None)                         ASAM's:  Six Dimensions of Multidimensional Assessment  Dimension 1:  Acute Intoxication and/or Withdrawal Potential:   Dimension 1:  Description of individual's past and current experiences of substance use and withdrawal: Pt denies recent alcohol or substance use  Dimension 2:  Biomedical Conditions and Complications:   Dimension 2:  Description of patient's biomedical conditions and  complications: Pt denies recent alcohol or substance use  Dimension 3:  Emotional, Behavioral, or Cognitive Conditions and Complications:  Dimension 3:  Description of emotional, behavioral, or cognitive conditions and complications: Pt denies recent alcohol or substance use  Dimension 4:  Readiness to Change:  Dimension 4:  Description of Readiness to Change criteria: Pt denies recent alcohol or substance use  Dimension 5:  Relapse, Continued  use, or Continued Problem Potential:  Dimension 5:  Relapse, continued use, or continued problem potential critiera description: Pt denies recent alcohol or substance use  Dimension 6:  Recovery/Living Environment:  Dimension 6:  Recovery/Iiving environment criteria description: Pt denies recent alcohol or substance use  ASAM Severity Score: ASAM's Severity Rating Score: 0  ASAM Recommended Level of Treatment: ASAM Recommended Level of Treatment:  (N/A)   Substance use Disorder (SUD) Substance Use Disorder (SUD)  Checklist Symptoms of Substance Use:  (NA)  Recommendations for Services/Supports/Treatments: Recommendations for Services/Supports/Treatments Recommendations For Services/Supports/Treatments:  (NA)  Discharge Disposition:    DSM5 Diagnoses: Patient Active Problem List   Diagnosis Date Noted   Schizoaffective disorder, depressive type (HCC) 12/29/2022   Suicidal ideations    Substance-induced disorder (HCC) 04/27/2021   Malingering 06/02/2020   Homicidal ideation 06/02/2020   Paranoia (HCC) 06/02/2020   Suicidal behavior 06/02/2020   Schizophrenia (HCC) 06/02/2020   Schizoaffective disorder (HCC) 05/08/2020   Schizophrenia, paranoid (HCC)    Cocaine dependence with cocaine-induced psychotic disorder with complication (HCC)    Social anxiety disorder 09/25/2016   Substance induced mood disorder (HCC) 04/29/2016   Polysubstance abuse (HCC)    Dysmenorrhea 12/30/2013     Referrals to Alternative Service(s): Referred to Alternative Service(s):   Place:   Date:   Time:    Referred to Alternative Service(s):   Place:   Date:   Time:    Referred to Alternative Service(s):   Place:   Date:   Time:    Referred to Alternative Service(s):   Place:   Date:   Time:     Alfredia Ferguson, LCAS

## 2023-05-17 NOTE — ED Notes (Signed)
Nurse spoke with pt's mother and mother stated that her main problem is addiction. Mother plans to call back in the morning for an update

## 2023-05-17 NOTE — ED Notes (Signed)
Pt dressed into scrubs, belongings placed into locker, security wanded pt.

## 2023-05-17 NOTE — ED Notes (Signed)
Pt dressed out and security called to wand patient.

## 2023-05-17 NOTE — ED Notes (Signed)
EDP at bedside during triage. Geodon order received .

## 2023-05-17 NOTE — ED Notes (Signed)
Pt currently yelling at objects in her room. Pt seems to go in and out of a daze and then begins screaming "leave me alone" at things that are not there.

## 2023-05-17 NOTE — ED Notes (Signed)
Pt refusing TTS. Pt stated "they will not listen to me" and then yelled at nurse to give her some peace.

## 2023-05-17 NOTE — ED Triage Notes (Signed)
Pt arrived [employer] with pt. Pt was on the street / store and the store called Police due to pt was screaming, chasing and yelling at people. Pt was yelling ,"I will kill you". Police brought pt emergency IVC . Pt screaming and yelling . Sentences are not making sense.

## 2023-05-17 NOTE — ED Notes (Signed)
Unable to obtain an EKG at this time due to agitation.

## 2023-05-17 NOTE — ED Notes (Signed)
Pt became very agitated when asked to have more blood drawn. Nurses and doctor explained to pt that if she would give a urine sample, she wouldn't have to have blood drawn. Pt aggressively agreed to give urine sample. Pt back in bed at this time. Statistician present.

## 2023-05-17 NOTE — ED Notes (Signed)
Pt informed of need for urine sample and EKG patient asked if she could rest for an hour and then do those things. Pt stated after an hour she will comply, but with this tech only

## 2023-05-17 NOTE — ED Notes (Signed)
Pt talking to this tech calmly and explained that she cannot calm down because she is "seeing diagrams" pointing in front of her, stated that she has not had her Klonopin and she "needs it"

## 2023-05-17 NOTE — ED Notes (Signed)
Pt requested to take a shower. Sitter gave her hygiene items and patient is currently showering.

## 2023-05-18 ENCOUNTER — Encounter (HOSPITAL_COMMUNITY): Payer: Self-pay | Admitting: Psychiatry

## 2023-05-18 DIAGNOSIS — F251 Schizoaffective disorder, depressive type: Secondary | ICD-10-CM

## 2023-05-18 DIAGNOSIS — F1910 Other psychoactive substance abuse, uncomplicated: Secondary | ICD-10-CM | POA: Diagnosis not present

## 2023-05-18 MED ORDER — CLONAZEPAM 0.5 MG PO TABS
0.5000 mg | ORAL_TABLET | Freq: Once | ORAL | Status: AC
  Administered 2023-05-18: 0.5 mg via ORAL
  Filled 2023-05-18: qty 1

## 2023-05-18 MED ORDER — HYDROXYZINE HCL 25 MG PO TABS
50.0000 mg | ORAL_TABLET | Freq: Once | ORAL | Status: AC
  Administered 2023-05-18: 50 mg via ORAL
  Filled 2023-05-18: qty 2

## 2023-05-18 MED ORDER — CLONAZEPAM 0.5 MG PO TABS
0.5000 mg | ORAL_TABLET | Freq: Three times a day (TID) | ORAL | Status: DC
  Administered 2023-05-18 – 2023-05-19 (×5): 0.5 mg via ORAL
  Filled 2023-05-18 (×5): qty 1

## 2023-05-18 MED ORDER — ZIPRASIDONE MESYLATE 20 MG IM SOLR
INTRAMUSCULAR | Status: AC
  Administered 2023-05-18: 20 mg via INTRAMUSCULAR
  Filled 2023-05-18: qty 20

## 2023-05-18 MED ORDER — POTASSIUM CHLORIDE CRYS ER 20 MEQ PO TBCR
40.0000 meq | EXTENDED_RELEASE_TABLET | Freq: Once | ORAL | Status: AC
  Administered 2023-05-18: 40 meq via ORAL
  Filled 2023-05-18: qty 2

## 2023-05-18 MED ORDER — FLUOXETINE HCL 20 MG PO CAPS
20.0000 mg | ORAL_CAPSULE | Freq: Every day | ORAL | Status: DC
Start: 2023-05-18 — End: 2023-05-19
  Administered 2023-05-18 – 2023-05-19 (×2): 20 mg via ORAL
  Filled 2023-05-18 (×2): qty 1

## 2023-05-18 MED ORDER — STERILE WATER FOR INJECTION IJ SOLN
INTRAMUSCULAR | Status: AC
  Filled 2023-05-18: qty 10

## 2023-05-18 MED ORDER — ZIPRASIDONE MESYLATE 20 MG IM SOLR: 20.0000 mg | Freq: Once | INTRAMUSCULAR | Status: AC

## 2023-05-18 MED ORDER — RISPERIDONE 1 MG PO TABS
3.0000 mg | ORAL_TABLET | Freq: Every day | ORAL | Status: DC
  Administered 2023-05-19: 3 mg via ORAL
  Filled 2023-05-18: qty 3

## 2023-05-18 NOTE — ED Notes (Signed)
Pt given a snack at this time.  

## 2023-05-18 NOTE — ED Notes (Signed)
Pt requesting an additional dose of her klonopin states she is feeling anxious. EDP notified.

## 2023-05-18 NOTE — ED Notes (Signed)
Pt to shower. Sitter outside door

## 2023-05-18 NOTE — ED Notes (Signed)
[facility] called this RN and information given in regards to pt status. Admissions will call back with a decision at a later time.

## 2023-05-18 NOTE — ED Notes (Signed)
Pt ambulated to restroom with steady gait. Julia [name]  Super RN 

## 2023-05-18 NOTE — ED Notes (Signed)
Recommendations from NP at [facility].   #Schizoaffective disorder, depressive type (HCC) #Polysubstance abuse (HCC)   -Recommend inpatient mental health hospitalization -Recommend continue outpatient medications -->Prozac 20 mg p.o. daily --> Risperdal 3 mg p.o. nightly --> Clonazepam 0.5 mg p.o. twice daily -Recommend agitation protocols -Recommend continue involuntary commitment -Recommend safety protocols   Daily contact with patient to assess and evaluate symptoms and progress in treatment, Medication management, and Plan recommend inpatient mental health hospitalization for stabilization and safety   Disposition: Recommend psychiatric Inpatient admission when medically cleared.

## 2023-05-18 NOTE — Progress Notes (Signed)
Patient has been denied by [facility] and has been faxed out. Patient meets BH inpatient criteria per Sanda Linger, NP. Patient has been faxed out to the following facilities:   [facility]  [address] [place] [place] [address] [phone]  [facility]  [address] [place] [address] [phone]  [facility]  [address] [place] [address] [phone]  [facility] [address]  [address] [place] [address] [phone]  [facility] Patient Placement  [facility] [place], [place] [phone]  [facility] [address]  [place] [address] [phone]  [facility]  [address] [place] [address] [phone]  [facility]  [address] [place] [address] [phone]  [facility]  [address] [place] [address] [phone]  [facility]  [address], [place] [address] [phone]  [facility]  [address], [place] [address] [phone]  [facility]  [address] [place] [address] [phone]  [facility]  [address] [place] [address] [phone]  [facility]  [address] [facility] [address] [place] [address] [phone]  [facility]  [address] [place], [place] [address] [phone]  [facility]  [address] [place] [address] [phone]  [facility]  [address] [place] [address] [phone]  [facility]  [address], [place] [address] [phone]   Damita Dunnings, MSW, LCSW-A  2:06 PM 05/18/2023

## 2023-05-18 NOTE — ED Notes (Signed)
Family updated as to patient's status.

## 2023-05-18 NOTE — ED Notes (Signed)
Pt in room screaming, incoherent.

## 2023-05-18 NOTE — ED Provider Notes (Addendum)
Emergency Medicine Observation Re-evaluation Note  Julia [name] is a 22 y.o. female, seen on rounds today.  Pt initially presented to the ED for complaints of Medical Clearance Currently, the patient is resting in her bed. Patient has history of schizophrenia, polysubstance abuse.  Physical Exam  BP 106/62 (BP Location: Left Arm)   Pulse 98   Temp 97.9 F (36.6 C) (Oral)   Resp 18   Ht 5\' 6"  (1.676 m)   Wt 83 kg   SpO2 96%   BMI 29.54 kg/m  Physical Exam General: No distress Cardiac: Regular rate Lungs: No respiratory distress Psych: Currently calm  ED Course / MDM  EKG:   I have reviewed the labs performed to date as well as medications administered while in observation.  Recent changes in the last 24 hours include no new changes.  Patient being observed in the ED.  Plan  Current plan is for awaiting psychiatry clearance.    Derwood Kaplan, MD 05/18/23 0746  8:41 AM Pt started screaming about 10 minutes ago. She then started escalating, came out of the room and started screaming "I am going to kill you bitch" "put her in witness protection". Patient redirected in room with attempts de-escalation. She continued to scream, charged the door and struck the glass door with her fist. We will order 20 mg geodon to calm patient down. She is psychotic.  We will try to avoid any form of restrains for now.   CRITICAL CARE Performed by: Tanaya Dunigan   Total critical care time: 33 minutes  Critical care time was exclusive of separately billable procedures and treating other patients.  Critical care was necessary to treat or prevent imminent or life-threatening deterioration.  Critical care was time spent personally by me on the following activities: development of treatment plan with patient and/or surrogate as well as nursing, discussions with consultants, evaluation of patient's response to treatment, examination of patient, obtaining history from patient or surrogate,  ordering and performing treatments and interventions, ordering and review of laboratory studies, ordering and review of radiographic studies, pulse oximetry and re-evaluation of patient's condition.    Derwood Kaplan, MD 05/18/23 [phone]

## 2023-05-18 NOTE — ED Notes (Signed)
Pt informed that atarax was ordered and that she could try this medication first that her klonopin can not be given again until 2100. Pt in agreement at this time.

## 2023-05-18 NOTE — Consult Note (Addendum)
Telepsych Consultation   Reason for Consult:  Psych Consult Referring Physician:  Lonell Grandchild, MD  Location of Patient: [facility]  Location of Provider: Other: [facility]   Patient Identification: Julia [name] MRN:  [id number] Principal Diagnosis: Schizoaffective disorder, depressive type [facility]) Diagnosis:  Principal Problem:   Schizoaffective disorder, depressive type (HCC) Active Problems:   Polysubstance abuse (HCC)   Total Time spent with patient: 30 minutes  Subjective:    Julia [name] is a 22 y.o. caucasian female with a past psychiatric history of polysubstance abuse, unspecified psychosis, social anxiety disorder, and schizoaffective disorder depressive type, and substance-induced mood/psychotic disorder, with pertinent medical comorbidities that include none, and additional past and pertinent psychiatric history of multiple hospitalizations and healthcare encounters for decompensation of the patient's mental health/substance abuse, who presented this encounter by way of [place] [employer], after the patient was found screaming, chasing, and yelling at people at a local gas station threatening to kill them and actively psychotic.  Patient is currently under involuntary commitment and medically cleared per EDP team.  HPI:   Patient seen today from the [facility] emergency department while the patient is physically located at the [facility] emergency department.  Patient seen today using telepsych consultation equipment that is secured.  Upon evaluation, patient endorses no recollection of the events that transpired just prior to coming in which led to her being brought in by police involuntarily. Patient endorses that she has no instability in her mood generally, states that her mood is dysphoric however from being held in the emergency department against her will.   Patient endorses no suicidal homicidal ideations.  Patient endorses  no auditory and or visual hallucinations, and objectively, does not appear to be presenting responding to internal stimuli, however, endorses that recently she has been hearing and seeing things, but declined to elaborate on this. Patient orientation is intact upon assessment.  Patient endorses when asked about paranoia that she is not paranoid, but has been being "stalked" lately and has concerns about this.    Expanding on endorsements of being "stalked", patient becomes more dysphoric in her mood and appreciably anxious, then vaguely tells this writer that, "I don't want to talk about it."  Patient asked about incidents that transpired this morning involving the patient where she was punching glass doors and screaming about witness protection, stating that she was going to kill staff, and observed hallucinating, to which patient reports that, "I am not talking about it with you".  Patient asked about substance use and UDS screening positive for amphetamines and cocaine, to which patient endorses that she does not know how those substances appeared on toxicology screening, states that she has not used drugs in a long time, but that she does have a history of amphetamine and cocaine abuse "unfortunately".   Patient endorses that she regularly sees an outpatient provider and is prescribed Klonopin, Prozac, and Risperdal that she states that she takes regularly, states that she tolerates the medications well, as well as they are helpful in maintaining her mental health stability.  Patient endorses she cannot recall who her provider is or where she receives care from, but states that, "a friend" takes her when she needs to go.   Patient endorses that she lives alone in an apartment and is on Social Security disability.  Patient endorses no support in the community, states that she is close with none of her family.  Patient endorses no EtOH  use.  Patient endorses daily tobacco use of about half pack a day.  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  Patient reports 1 suicide attempt in the past at age 35 but does not elaborate on this.  Denies history of self-injurious behavior.   Past Psychiatric History: polysubstance abuse, unspecified psychosis, social anxiety disorder, and schizoaffective disorder depressive type, and substance-induced mood/psychotic disorder  Risk to Self: Yes Risk to Others: Yes Prior Inpatient Therapy: Yes, multiple prior hospitalizations and encounters for decompensation of the patient's mental health and/or substance abuse Prior Outpatient Therapy: Yes, history of a variety of outpatient medications including ACT team services historically  Past Medical History:  Past Medical History:  Diagnosis Date   Burning with urination 05/03/2015   Contraceptive management 05/03/2015   Depression    Dysmenorrhea 12/30/2013   Heroin addiction (HCC)    Menorrhagia 12/30/2013   Menstrual extraction 12/30/2013   Migraines    Schizophrenia (HCC)    Social anxiety disorder 09/25/2016   Suicidal ideations    Vaginal odor 05/03/2015    Past Surgical History:  Procedure Laterality Date   NO PAST SURGERIES     Family History:  Family History  Problem Relation Age of Onset   Depression Mother    Hypertension Father    Hyperlipidemia Father    Cancer Paternal Grandmother        breast, uterine   Cirrhosis Paternal Grandfather        due to alcohol   Family Psychiatric  History: None endorsed Social History:  Social History   Substance and Sexual Activity  Alcohol Use Not Currently   Comment: socially     Social History   Substance and Sexual Activity  Drug Use Not Currently   Types: Methamphetamines   Comment: last used 10/07/21    Social History   Socioeconomic History   Marital status: Single    Spouse name: Not on file   Number of children: Not on file   Years of education: Not on file   Highest education level: Not on file   Occupational History   Occupation: Unemployed  Tobacco Use   Smoking status: Former    Current packs/day: 0.50    Average packs/day: 0.5 packs/day for 1 year (0.5 ttl pk-yrs)    Types: Cigarettes    Passive exposure: Past   Smokeless tobacco: Never  Vaping Use   Vaping status: Never Used  Substance and Sexual Activity   Alcohol use: Not Currently    Comment: socially   Drug use: Not Currently    Types: Methamphetamines    Comment: last used 10/07/21   Sexual activity: Yes    Birth control/protection: Condom  Other Topics Concern   Not on file  Social History Narrative   Not on file   Social Determinants of Health   Financial Resource Strain: Not on file  Food Insecurity: No Food Insecurity (12/29/2022)   Hunger Vital Sign    Worried About Running Out of Food in the Last Year: Never true    Ran Out of Food in the Last Year: Never true  Transportation Needs: No Transportation Needs (12/29/2022)   PRAPARE - Administrator, Civil Service (Medical): No    Lack of Transportation (Non-Medical): No  Physical Activity: Not on file  Stress: Not on file  Social Connections: Not on file   Additional Social History:    Allergies:   Allergies  Allergen Reactions   Abilify [Aripiprazole]  Other (See Comments)    EPS Jaw locked up   Haldol [Haloperidol] Other (See Comments)    EPS   Tylenol [Acetaminophen] Rash    Labs:  Results for orders placed or performed during the hospital encounter of 05/17/23 (from the past 48 hour(s))  Comprehensive metabolic panel     Status: Abnormal   Collection Time: 05/17/23  9:02 AM  Result Value Ref Range   Sodium 140 135 - 145 mmol/L   Potassium 3.1 (L) 3.5 - 5.1 mmol/L   Chloride 105 98 - 111 mmol/L   CO2 20 (L) 22 - 32 mmol/L   Glucose, Bld 96 70 - 99 mg/dL    Comment: Glucose reference range applies only to samples taken after fasting for at least 8 hours.   BUN 12 6 - 20 mg/dL   Creatinine, Ser 3.29 0.44 - 1.00 mg/dL    Calcium 9.6 8.9 - 51.8 mg/dL   Total Protein 7.9 6.5 - 8.1 g/dL   Albumin 4.1 3.5 - 5.0 g/dL   AST 21 15 - 41 U/L   ALT 17 0 - 44 U/L   Alkaline Phosphatase 84 38 - 126 U/L   Total Bilirubin 0.6 0.3 - 1.2 mg/dL   GFR, Estimated >84 >16 mL/min    Comment: (NOTE) Calculated using the CKD-EPI Creatinine Equation (2021)    Anion gap 15 5 - 15    Comment: Performed at [facility], [address], [place], [place] [address]  Ethanol     Status: None   Collection Time: 05/17/23  9:02 AM  Result Value Ref Range   Alcohol, Ethyl (B) <10 <10 mg/dL    Comment: (NOTE) Lowest detectable limit for serum alcohol is 10 mg/dL.  For medical purposes only. Performed at [facility], [address] [place], [place] [address]   CBC with Diff     Status: Abnormal   Collection Time: 05/17/23  9:02 AM  Result Value Ref Range   WBC 13.0 (H) 4.0 - 10.5 K/uL   RBC 4.84 3.87 - 5.11 MIL/uL   Hemoglobin 13.3 12.0 - 15.0 g/dL   HCT 93.2 35.5 - 73.2 %   MCV 87.6 80.0 - 100.0 fL   MCH 27.5 26.0 - 34.0 pg   MCHC 31.4 30.0 - 36.0 g/dL   RDW 20.2 54.2 - 70.6 %   Platelets 297 150 - 400 K/uL   nRBC 0.0 0.0 - 0.2 %   Neutrophils Relative % 67 %   Neutro Abs 8.7 (H) 1.7 - 7.7 K/uL   Lymphocytes Relative 23 %   Lymphs Abs 3.0 0.7 - 4.0 K/uL   Monocytes Relative 9 %   Monocytes Absolute 1.2 (H) 0.1 - 1.0 K/uL   Eosinophils Relative 0 %   Eosinophils Absolute 0.0 0.0 - 0.5 K/uL   Basophils Relative 0 %   Basophils Absolute 0.0 0.0 - 0.1 K/uL   Immature Granulocytes 1 %   Abs Immature Granulocytes 0.07 0.00 - 0.07 K/uL    Comment: Performed at [facility], [address] [place], [place] [address]  Acetaminophen level     Status: Abnormal   Collection Time: 05/17/23  9:02 AM  Result Value Ref Range   Acetaminophen (Tylenol), Serum <10 (L) 10 - 30 ug/mL    Comment: (NOTE) Therapeutic concentrations vary significantly. A range of 10-30 ug/mL  may be an effective concentration for many patients. However,  some  are best treated at concentrations outside of this range. Acetaminophen concentrations >150 ug/mL at  4 hours after ingestion  and >50 ug/mL at 12 hours after ingestion are often associated with  toxic reactions.  Performed at [facility], [address] [place], [place] [address]   Salicylate level     Status: Abnormal   Collection Time: 05/17/23  9:02 AM  Result Value Ref Range   Salicylate Lvl <7.0 (L) 7.0 - 30.0 mg/dL    Comment: Performed at [facility], [address] [place], [place] [address]  Urine rapid drug screen (hosp performed)     Status: Abnormal   Collection Time: 05/17/23 11:44 AM  Result Value Ref Range   Opiates NONE DETECTED NONE DETECTED   Cocaine POSITIVE (A) NONE DETECTED   Benzodiazepines NONE DETECTED NONE DETECTED   Amphetamines POSITIVE (A) NONE DETECTED   Tetrahydrocannabinol NONE DETECTED NONE DETECTED   Barbiturates NONE DETECTED NONE DETECTED    Comment: (NOTE) DRUG SCREEN FOR MEDICAL PURPOSES ONLY.  IF CONFIRMATION IS NEEDED FOR ANY PURPOSE, NOTIFY LAB WITHIN 5 DAYS.  LOWEST DETECTABLE LIMITS FOR URINE DRUG SCREEN Drug Class                     Cutoff (ng/mL) Amphetamine and metabolites    1000 Barbiturate and metabolites    200 Benzodiazepine                 200 Opiates and metabolites        300 Cocaine and metabolites        300 THC                            50 Performed at [facility], [address] [place], [place] [address]   POC urine preg, ED     Status: None   Collection Time: 05/17/23 11:57 AM  Result Value Ref Range   Preg Test, Ur NEGATIVE NEGATIVE    Comment:        THE SENSITIVITY OF THIS METHODOLOGY IS >24 mIU/mL     Medications:  Current Facility-Administered Medications  Medication Dose Route Frequency Provider Last Rate Last Admin   clonazePAM (KLONOPIN) tablet 0.5 mg  0.5 mg Oral TID Gilda Crease, MD   0.5 mg at 05/18/23 0845   risperiDONE (RISPERDAL) tablet 3 mg  3 mg Oral Daily Orson Aloe, [facility]       Current Outpatient Medications  Medication Sig Dispense Refill   clonazePAM (KLONOPIN) 0.5 MG tablet Take 0.5 mg by mouth in the morning, at noon, and at bedtime.     FLUoxetine (PROZAC) 20 MG capsule Take 1 capsule (20 mg total) by mouth daily. 30 capsule 0   acetaminophen (TYLENOL) 500 MG tablet Take 500 mg by mouth every 6 (six) hours as needed for moderate pain. (Patient not taking: Reported on 05/17/2023)     QUEtiapine (SEROQUEL) 300 MG tablet Take 300 mg by mouth at bedtime. (Patient not taking: Reported on 05/17/2023)     risperiDONE (RISPERDAL) 3 MG tablet Take 1 tablet (3 mg total) by mouth at bedtime. (Patient not taking: Reported on 05/17/2023) 30 tablet 0    Musculoskeletal: Strength & Muscle Tone: within normal limits Gait & Station: normal Patient leans: N/A    Psychiatric Specialty Exam:  Presentation  General Appearance:  Other (comment) (atypical and paranoid interpersonal style)  Eye Contact: Other (comment) (Variable)  Speech: Clear and Coherent  Speech Volume: Normal  Handedness: Right   Mood and Affect  Mood:  Dysphoric  Affect: Other (comment) (Oddly constricted)   Thought Process  Thought Processes: Other (comment) (Short, vague, concrete, superficial)  Descriptions of Associations:Intact  Orientation:Full (Time, Place and Person)  Thought Content:Paranoid Ideation; Other (comment) (Concrete, vague)  History of Schizophrenia/Schizoaffective disorder:Yes  Duration of Psychotic Symptoms:Greater than six months  Hallucinations:Hallucinations: None  Ideas of Reference:Paranoia  Suicidal Thoughts:Suicidal Thoughts: No  Homicidal Thoughts:Homicidal Thoughts: No   Sensorium  Memory: Immediate Poor; Recent Poor; Remote Poor  Judgment: Impaired  Insight: Lacking   Executive Functions  Concentration: Fair  Attention Span: Fair  Recall: Other (comment) (Variable)  Fund of  Knowledge: Poor  Language: Fair   Psychomotor Activity  Psychomotor Activity: Psychomotor Activity: Normal   Assets  Assets: Health and safety inspector; Housing; Physical Health; Resilience   Sleep  Sleep: Sleep: Fair    Physical Exam: Physical Exam Vitals and nursing note reviewed.  Constitutional:      General: She is not in acute distress.    Appearance: She is not ill-appearing, toxic-appearing or diaphoretic.  Pulmonary:     Effort: Pulmonary effort is normal.  Neurological:     Mental Status: She is alert and oriented to person, place, and time.  Psychiatric:        Attention and Perception: Attention normal. She does not perceive auditory (Denies currently, but reports recently) or visual (Denies currently, but reports recently) hallucinations.        Mood and Affect: Affect is inappropriate.        Behavior: Behavior is agitated. Behavior is cooperative.        Thought Content: Thought content is paranoid and delusional. Thought content does not include homicidal or suicidal ideation.        Judgment: Judgment is impulsive and inappropriate.    Review of Systems  All other systems reviewed and are negative.  Blood pressure 95/72, pulse (!) 108, temperature 98 F (36.7 C), temperature source Oral, resp. rate 14, height 5\' 6"  (1.676 m), weight 83 kg, SpO2 96%. Body mass index is 29.54 kg/m.  Treatment Plan Summary:  Patient presented this encounter by way of [place] [employer], after the patient was found screaming, chasing, and yelling at people at a local gas station threatening to kill them and actively psychotic.  Upon evaluation, patient presents with symptomology that is most consistent with decompensation of the patient's chronic, current, and historical diagnosis of schizoaffective disorder depressive type and polysubstance abuse.  Given today's evaluation where the patient presents with limited insight into the events that transpired  which led to this encounter and appreciable paranoia, has no support within the community to reasonably facilitate aiding the patient in keeping her safe and supported, and has had a behavioral incident as recent as this morning of observations by staff and EDP provider of actively hallucinating, attempting to harm staff, and endorsing desire to kill staff requiring emergent IM medications to be given, will proceed with the recommendation for inpatient mental health hospitalization for safety and stability of the patient, as well as the additional recommendations listed below.  Recommendations  #Schizoaffective disorder, depressive type (HCC) #Polysubstance abuse (HCC)  -Recommend inpatient mental health hospitalization -Recommend continue outpatient medications -->Prozac 20 mg p.o. daily --> Risperdal 3 mg p.o. nightly --> Clonazepam 0.5 mg p.o. twice daily (per [place])  -Recommend agitation protocols -Recommend continue involuntary commitment -Recommend safety protocols  Daily contact with patient to assess and evaluate symptoms and progress in treatment, Medication management, and Plan recommend inpatient mental health hospitalization for stabilization and safety  Disposition: Recommend psychiatric Inpatient admission when medically cleared.  This service was provided via telemedicine using a 2-way, interactive audio and video technology.  Names of all persons participating in this telemedicine service and their role in this encounter. Name: Leana Gamer, RN Role: Care Nurse  Name: Arsenio Loader, NP Role: Psych Consult provider  Name: Dr. Rhunette Croft, MD Role: EDP  Name:  Role:     Lenox Ponds, NP 05/18/2023 12:07 PM

## 2023-05-18 NOTE — ED Notes (Signed)
Pt states this RN that she "feels her heart palpating and she is feeling very anxious." "I have never felt this anxious before can I get a shot." EDP notified.
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
## 2023-05-18 NOTE — ED Notes (Signed)
TTS at bedside. 
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
## 2023-05-19 NOTE — ED Provider Notes (Addendum)
Emergency Medicine Observation Re-evaluation Note  Julia [name] is a 22 y.o. female, seen on rounds today.  Pt initially presented to the ED for complaints of substance use disorder and related psychosis. Pt currently calm, no distress. No new c/o this AM.   Physical Exam  BP 94/69 (BP Location: Left Arm)   Pulse (!) 112   Temp 98 F (36.7 C) (Oral)   Resp 16   Ht 1.676 m (5\' 6" )   Wt 83 kg   SpO2 100%   BMI 29.54 kg/m  Physical Exam General: calm. Cardiac: regular rate. Lungs: breathing comfortably. Psych: calm.   ED Course / MDM    I have reviewed the labs performed to date as well as medications administered while in observation.  Recent changes in the last 24 hours include ED obs, metabolism of substances, med management, reassessment.   Plan  [facility] team following with current recommendation of inpatient [facility] treatment. From review of chart, pt appears to have long history of similar symptoms, and unfortunately does not appear to have achieved any prolonged lasting therapeutic benefit from prior inpatient stays (as previously has relatively quickly returned to pattern of medication non-compliance and substance abuse, which then directly cause and/or exacerbate her schizoaffective disorder).  It appears patients best opportunity for long term symptom management is avoiding cocaine and meth use, and remaining compliant w her prescribed meds.  Dispo per [facility] team.    Cathren Laine, MD 05/19/23 [phone]  Pt accepted to [facility], Dr Mickle Mallory.   Pt currently awake and alert, ambulates in hall w steady gait, no faintness.   Pt currently appears stable for transfer/transport.    Cathren Laine, MD 05/19/23 1200  BH team indicates [facility] removed bed offer as that bed still occupied, and patient now accepted at [facility], Dr Terrall Laity.   Pt currently appears stable for transport/transfer.       Cathren Laine, MD 05/19/23 [phone]

## 2023-05-19 NOTE — Progress Notes (Signed)
Pt has been accepted to [facility] on 05/19/2023  Bed assignment: 5 [address] after 2 pm today.   Pt meets inpatient criteria per: Alona Bene, PMHNP   Attending Physician will be: Orest Dikes MD  Report can be called to: [phone]  Pt can arrive after: 2 PM today. Patient was accepted to [facility] however facility called and reported that the female bed they had they patient decided  to stay and at this time they had to rescind the bed offer for this patient.    Care Team Notified: [facility] Rona Ravens RN  Arther Abbott RN, Neldon Mc RN, Ocie Bob RN    [place] Zaida Reiland LCSW-A   05/19/2023 12:23 PM

## 2023-05-19 NOTE — ED Notes (Signed)
Pt ambulated out of dept with Patent examiner for transport to [facility] , gait steady.  Pt's belongings sent with pt.

## 2023-05-19 NOTE — ED Notes (Signed)
Dispatch called to have pt brought back to dept due to the facility [facility]) rescinding bed assignment.

## 2023-05-19 NOTE — Discharge Instructions (Addendum)
Transfer to [facility].   Your potassium level is low - eat plenty of fruits and vegetables, and follow up with primary care doctor for recheck in 1-2 weeks.

## 2023-05-19 NOTE — ED Notes (Signed)
Pt returned to room.  Lunch tray at bedside.

## 2023-05-19 NOTE — ED Notes (Signed)
Attempted to call report at [phone] x [phone] without answer

## 2023-05-19 NOTE — Progress Notes (Signed)
Pt has been accepted to [facility] on 05/19/2023 Bed assignment: [facility].  Pt meets inpatient criteria per: Alona Bene, PMHNP   Attending Physician will be: Dr. Mickle Mallory MD  Report can be called to: [phone]   Pt can arrive: Anytime   Care Team Notified: Alona Bene, PMHNP Ocie Bob RN.     [place] Petra Dumler LCSW-A   05/19/2023 11:08 AM

## 2023-05-19 NOTE — ED Notes (Addendum)
[employer] here for transport , [employer] has pt's belongings bag. Pt ambulated out with steady gait.

## 2023-05-19 NOTE — Progress Notes (Signed)
LCSW Progress Note  [id number]   Julia [name]  05/19/2023  10:20 AM  Description:   Inpatient Psychiatric Referral  Patient was recommended inpatient per [facility], PMHNP  There are no available beds at [facility], per [facility] Rona Ravens RN. Patient was referred to the following out of network facilities:    Destination  Service Provider Address Phone Fax  [facility]  [address] [place] [place] [address] [phone]  [facility]  [address] [place] [address] [phone]  [facility]  [address] [place] [address] [phone]  [facility] [address]  [address], [place] [address] [phone]  [facility] Patient Placement  [facility], [place] [phone]  [facility] [address]  [place] [address] [phone]  [facility]  [address] [place] [address] [phone]  [facility]  [address] [place] [address] [phone]  [facility]  [address] [place] [address] [phone]  [facility]  [address], [place] [address] [phone]  [facility]  [address], [place] [address] [phone]  [facility]  [address] [place] [address] [phone]  [facility]  [address] [place] [address] [phone]  [facility]  [facility] Regino Bellow [place] [address] [phone]  [facility]  [address] [place], [place] [address] [phone]  [facility]  [address] [place] [address] [phone]  [facility]  [address] [place] [address] [phone]  [facility]  [address] [place] [address] [phone]      Situation ongoing, CSW to continue following and update chart as more information becomes available.      [place] Makelle Marrone LCSW-A  05/19/2023 10:20 AM

## 2023-07-11 ENCOUNTER — Emergency Department (HOSPITAL_COMMUNITY)
Admission: EM | Admit: 2023-07-11 | Discharge: 2023-07-11 | Disposition: A | Discharge status: Other Acute Inpatient Hospital | Payer: BC Managed Care – PPO | Attending: Emergency Medicine | Admitting: Emergency Medicine

## 2023-07-11 ENCOUNTER — Emergency Department (HOSPITAL_COMMUNITY): Payer: BC Managed Care – PPO

## 2023-07-11 ENCOUNTER — Other Ambulatory Visit: Payer: Self-pay

## 2023-07-11 DIAGNOSIS — F1490 Cocaine use, unspecified, uncomplicated: Secondary | ICD-10-CM | POA: Insufficient documentation

## 2023-07-11 DIAGNOSIS — E876 Hypokalemia: Secondary | ICD-10-CM

## 2023-07-11 DIAGNOSIS — F1410 Cocaine abuse, uncomplicated: Secondary | ICD-10-CM

## 2023-07-11 DIAGNOSIS — F23 Brief psychotic disorder: Secondary | ICD-10-CM | POA: Diagnosis not present

## 2023-07-11 DIAGNOSIS — Z1152 Encounter for screening for COVID-19: Secondary | ICD-10-CM | POA: Insufficient documentation

## 2023-07-11 DIAGNOSIS — F1510 Other stimulant abuse, uncomplicated: Secondary | ICD-10-CM | POA: Insufficient documentation

## 2023-07-11 DIAGNOSIS — F259 Schizoaffective disorder, unspecified: Secondary | ICD-10-CM | POA: Diagnosis present

## 2023-07-11 DIAGNOSIS — F19159 Other psychoactive substance abuse with psychoactive substance-induced psychotic disorder, unspecified: Secondary | ICD-10-CM

## 2023-07-11 DIAGNOSIS — F419 Anxiety disorder, unspecified: Secondary | ICD-10-CM

## 2023-07-11 LAB — CBC WITH DIFFERENTIAL/PLATELET
Abs Immature Granulocytes: 0.05 10*3/uL (ref 0.00–0.07)
Basophils Absolute: 0 10*3/uL (ref 0.0–0.1)
Basophils Relative: 0 %
Eosinophils Absolute: 0.1 10*3/uL (ref 0.0–0.5)
Eosinophils Relative: 1 %
HCT: 39.2 % (ref 36.0–46.0)
Hemoglobin: 12.4 g/dL (ref 12.0–15.0)
Immature Granulocytes: 1 %
Lymphocytes Relative: 42 %
Lymphs Abs: 4.5 10*3/uL — ABNORMAL HIGH (ref 0.7–4.0)
MCH: 28.2 pg (ref 26.0–34.0)
MCHC: 31.6 g/dL (ref 30.0–36.0)
MCV: 89.3 fL (ref 80.0–100.0)
Monocytes Absolute: 0.7 10*3/uL (ref 0.1–1.0)
Monocytes Relative: 7 %
Neutro Abs: 5.4 10*3/uL (ref 1.7–7.7)
Neutrophils Relative %: 49 %
Platelets: 261 10*3/uL (ref 150–400)
RBC: 4.39 MIL/uL (ref 3.87–5.11)
RDW: 14.4 % (ref 11.5–15.5)
WBC: 10.8 10*3/uL — ABNORMAL HIGH (ref 4.0–10.5)
nRBC: 0 % (ref 0.0–0.2)

## 2023-07-11 LAB — RESP PANEL BY RT-PCR (RSV, FLU A&B, COVID)  RVPGX2
Influenza A by PCR: NEGATIVE
Influenza B by PCR: NEGATIVE
Resp Syncytial Virus by PCR: NEGATIVE
SARS Coronavirus 2 by RT PCR: NEGATIVE

## 2023-07-11 LAB — COMPREHENSIVE METABOLIC PANEL
ALT: 47 U/L — ABNORMAL HIGH (ref 0–44)
AST: 35 U/L (ref 15–41)
Albumin: 3.7 g/dL (ref 3.5–5.0)
Alkaline Phosphatase: 79 U/L (ref 38–126)
Anion gap: 9 (ref 5–15)
BUN: 9 mg/dL (ref 6–20)
CO2: 22 mmol/L (ref 22–32)
Calcium: 8.5 mg/dL — ABNORMAL LOW (ref 8.9–10.3)
Chloride: 107 mmol/L (ref 98–111)
Creatinine, Ser: 0.89 mg/dL (ref 0.44–1.00)
GFR, Estimated: >60 mL/min (ref >60)
Glucose, Bld: 111 mg/dL — ABNORMAL HIGH (ref 70–99)
Potassium: 3.3 mmol/L — ABNORMAL LOW (ref 3.5–5.1)
Sodium: 138 mmol/L (ref 135–145)
Total Bilirubin: 0.3 mg/dL (ref <1.2)
Total Protein: 6.9 g/dL (ref 6.5–8.1)

## 2023-07-11 LAB — RAPID URINE DRUG SCREEN, HOSP PERFORMED
Amphetamines: NOT DETECTED
Barbiturates: NOT DETECTED
Benzodiazepines: POSITIVE — AB
Cocaine: POSITIVE — AB
Opiates: NOT DETECTED
Tetrahydrocannabinol: NOT DETECTED

## 2023-07-11 LAB — ETHANOL: Alcohol, Ethyl (B): <10 mg/dL (ref <10)

## 2023-07-11 LAB — ACETAMINOPHEN LEVEL: Acetaminophen (Tylenol), Serum: <10 ug/mL — ABNORMAL LOW (ref 10–30)

## 2023-07-11 LAB — SALICYLATE LEVEL: Salicylate Lvl: <7 mg/dL — ABNORMAL LOW (ref 7.0–30.0)

## 2023-07-11 LAB — HCG, QUANTITATIVE, PREGNANCY: hCG, Beta Chain, Quant, S: <1 m[IU]/mL (ref <5)

## 2023-07-11 MED ORDER — LORAZEPAM 2 MG/ML IJ SOLN
2.0000 mg | Freq: Once | INTRAMUSCULAR | Status: AC
  Administered 2023-07-11: 2 mg via INTRAMUSCULAR
  Filled 2023-07-11: qty 1

## 2023-07-11 MED ORDER — LORAZEPAM 1 MG PO TABS
1.0000 mg | ORAL_TABLET | Freq: Once | ORAL | Status: DC
  Filled 2023-07-11 (×2): qty 1

## 2023-07-11 MED ORDER — QUETIAPINE FUMARATE 100 MG PO TABS: 200.0000 mg | ORAL_TABLET | Freq: Every day | ORAL | Status: DC

## 2023-07-11 MED ORDER — ZIPRASIDONE MESYLATE 20 MG IM SOLR
INTRAMUSCULAR | Status: AC
  Filled 2023-07-11: qty 20

## 2023-07-11 MED ORDER — CLONAZEPAM 0.5 MG PO TABS
1.0000 mg | ORAL_TABLET | Freq: Three times a day (TID) | ORAL | Status: DC | PRN
  Administered 2023-07-11: 1 mg via ORAL
  Filled 2023-07-11: qty 2

## 2023-07-11 MED ORDER — STERILE WATER FOR INJECTION IJ SOLN
INTRAMUSCULAR | Status: AC
  Filled 2023-07-11: qty 10

## 2023-07-11 MED ORDER — FLUOXETINE HCL 20 MG PO CAPS
20.0000 mg | ORAL_CAPSULE | Freq: Every day | ORAL | Status: DC
Start: 2023-07-11 — End: 2023-07-11
  Administered 2023-07-11: 20 mg via ORAL
  Filled 2023-07-11: qty 1

## 2023-07-11 MED ORDER — KETOROLAC TROMETHAMINE 30 MG/ML IJ SOLN
30.0000 mg | Freq: Once | INTRAMUSCULAR | Status: DC
Start: 2023-07-11 — End: 2023-07-11
  Filled 2023-07-11: qty 1

## 2023-07-11 MED ORDER — CLONAZEPAM 0.5 MG PO TABS
1.0000 mg | ORAL_TABLET | Freq: Once | ORAL | Status: AC
  Administered 2023-07-11: 1 mg via ORAL
  Filled 2023-07-11: qty 2

## 2023-07-11 MED ORDER — LOXAPINE SUCCINATE 25 MG PO CAPS
25.0000 mg | ORAL_CAPSULE | Freq: Every day | ORAL | Status: DC
  Filled 2023-07-11 (×3): qty 1

## 2023-07-11 MED ORDER — POTASSIUM CHLORIDE CRYS ER 20 MEQ PO TBCR
40.0000 meq | EXTENDED_RELEASE_TABLET | Freq: Once | ORAL | Status: AC
  Administered 2023-07-11: 40 meq via ORAL
  Filled 2023-07-11: qty 2

## 2023-07-11 MED ORDER — DIPHENHYDRAMINE HCL 50 MG/ML IJ SOLN
50.0000 mg | Freq: Once | INTRAMUSCULAR | Status: AC
  Administered 2023-07-11: 50 mg via INTRAMUSCULAR
  Filled 2023-07-11: qty 1
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
## 2023-07-11 NOTE — ED Notes (Signed)
Pt black shoes and rain jacket placed in belongings bag in locker 2.

## 2023-07-11 NOTE — ED Notes (Signed)
Pt allowed me to take vitals however the bp did not register and attempted to inflate again, pt said it was hurting her arm and took it off, I offered to use the other arm however pt refused so we were unable to get a bp at this time. Pt ate lunch with no issues.
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
## 2023-07-11 NOTE — Progress Notes (Signed)
LCSW Progress Note  [id number]   Julia [name]  07/11/2023  3:04 PM  Description:   Inpatient Psychiatric Referral  Patient was recommended inpatient per [facility], PMHNP . There are [facility] available beds at [facility], per [facility] RN. Patient was referred to the following out of network facilities:   Destination  Service Provider Address Phone Fax  [facility] [address], [place] [address] [phone]  [facility] [address] [place] [address] [phone]  [facility] [address] [place], [place] [address] [phone]  [facility] [address] [place] [address] [phone]  [facility] [address], [place] [address] [phone]  [facility] [address], [place] [address] [phone]  [facility] [address] [place] [address] [phone]  [facility] [place] [phone]  [facility] [address] [place] [address] [phone]  [facility] [address] [place] [address] [phone]  [facility] [address] [place] [address] [phone]  [facility] EFAX [address] [place], [place] [phone]  [facility] [address], [place] [address] [phone]  [facility] [address], [place] [address] [phone]   Discharge Information     Situation ongoing, CSW to continue following and update chart as more information becomes available.      [place] Shelah Heatley, MSW,  LCSW  07/11/2023 3:04 PM

## 2023-07-11 NOTE — ED Notes (Signed)
TTS is recommending inpatient services to [facility].

## 2023-07-11 NOTE — ED Notes (Signed)
Pt still sleeping. Not demonstrating any aggressive behaviors at this time.

## 2023-07-11 NOTE — BH Assessment (Signed)
Comprehensive Clinical Assessment (CCA) Note  07/11/2023 [name] [id number]  DISPOSITION: Per Dr. Enedina Finner, pt is recommended for Inpatient psychiatric treatment.   The patient demonstrates the following risk factors for suicide: Chronic risk factors for suicide include: psychiatric disorder of Schizoaffective d/o and substance use disorder. Acute risk factors for suicide include: unemployment. Protective factors for this patient include: hope for the future. Considering these factors, the overall suicide risk at this point appears to be low. Patient is appropriate for outpatient follow up.  Pt is a 22 yo female who presented to the ED during the /early morning of 12/27. Pt was acting erratically, making non-sensical statements and was assaultive with staff. Pt was placed in restraints which she escaped and eloped from the hospital. Pt was found by the police and returned to the hospital and against placed in restraints for her safety and the safety of others due to her assaultive behaviors. Per chart she was stating she has a brain tumor, that she is pregnant, and she was "continuing" to spit, hit and kick staff. Pt was given Geodon and finally began to sleep. Pt tested positive for cocaine and reported using an unknown amount of cocaine yesterday. Pt denied using methamphetamines, alcohol and cannabis. Pt has been psychiatrically hospitalized on at least 7 occasions with that last occurrence in June, 2024 for similar presentation. Pt stated that she has an OP provider (Dr. Kenard Gower) for medication management but denied seeing an OP therapist. Pt reported a hx of schizoaffective d/o. Pt would not give details but stated she was having AVH. Pt stated that she was receiving Disability income due to her mental health condition. Pt would given no further details.   Per pt she lives with her grandmother currently, is unemployed and receives Disability income due to her mental health condition.    Chief  Complaint:  Chief Complaint  Patient presents with   Headache    Cough, "feels like something stuck in throat"   Visit Diagnosis:  Schizoaffective d/o Stimulant use d/o, Cocaine, Amphetamines    Flowsheet Row ED from 07[facility]11[facility]20[facility] at [facility] ED from [facility]17/2023 in [facility] at [facility] ED from [facility]/05/2023 [facility] at [facility]  C-SSRS RISK CATEGORY No Risk No Risk High Risk         CCA Screening, Triage and Referral (STR)  Patient Reported Information How did you hear about [place]? Self  What Is the Reason for Your Visit/Call Today? Pt is a 22 yo female who presented to the ED during the /early morning of 12/27. Pt was acting erratically, making non-sensical statements and was assaultive with staff. Pt was placed in restraints which she escaped and eloped from the hospital. Pt was found by the police and returned to the hospital and against placed in restraints for her safety and the safety of others due to her assaultive behaviors. Per chart she was stating she has a brain tumor, that she is pregnant, and she was "continuing" to spit, hit and kick staff. Pt was given Geodon and finally began to sleep. Pt tested positive for cocaine and reported using an unknown amount of cocaine yesterday. Pt denied using methamphetamines, alcohol and cannabis. Pt has been psychiatrically hospitalized on at least 7 occasions with that last occurrence in June, 2024 for similar presentation. Pt stated that she has an OP provider (Dr. Kenard Gower) for medication management but denied seeing an OP therapist. Pt  reported a hx of schizoaffective d/o. Pt would not given details but stated she was having AVH.  How Long Has This Been Causing You Problems? > than 6 months  What Do You Feel Would Help You the Most Today? Treatment for Depression or other mood problem   Have You Recently Had Any Thoughts About  Hurting Yourself? No  Are You Planning to Commit Suicide/Harm Yourself At This time? No   Flowsheet Row ED from [facility]11[facility]20[facility] from [facility]20[facility] ED from [facility]05/2023 in [facility]  C-SSRS RISK CATEGORY No Risk No Risk High Risk       Have you Recently Had Thoughts About Hurting Someone [name]? No  Are You Planning to Harm Someone at This Time? No  Explanation: na   Have You Used Any Alcohol or Drugs in the Past 24 Hours? Yes  What Did You Use and How Much? Unknown amount of cocaine   Do You Currently Have a Therapist/Psychiatrist? Yes  Name of Therapist/Psychiatrist: Name of Therapist/Psychiatrist: Pt reported Dr. Kenard Gower prescribing her psychiatric medications. Pt denied seeing an OP therapist.   Have You Been Recently Discharged From Any Office Practice or Programs? No  Explanation of Discharge From Practice/Program: na     CCA Screening Triage Referral Assessment Type of Contact: Tele-Assessment  Telemedicine Service Delivery:   Is this Initial or Reassessment? Is this Initial or Reassessment?: Initial Assessment  Date Telepsych consult ordered in [facility]:  Date Telepsych consult ordered in [facility]: 07/11/23  Time Telepsych consult ordered in [facility]:  Time Telepsych consult ordered in [facility]: 0855  Location of Assessment: [facility] ED  Provider Location: [facility] Assessment Services   Collateral Involvement: none allowed   Does Patient Have a Automotive engineer Guardian? No  Legal Guardian Contact Information: na  Copy of Legal Guardianship Form: No - copy requested  Legal Guardian Notified of Arrival: -- (na)  Legal Guardian Notified of Pending Discharge: -- (na)  If Minor and Not Living with Parent(s), Who has Custody? na- adult  Is CPS involved or ever been involved? Never (none reported)  Is APS involved or ever  been involved? Never (none reported)   Patient Determined To Be At Risk for Harm To Self or Others Based on Review of Patient Reported Information or Presenting Complaint? Yes, for Harm to Others  Method: No Plan  Availability of Means: No access or NA  Intent: Vague intent or NA  Notification Required: No need or identified person  Additional Information for Danger to Others Potential: Active psychosis  Additional Comments for Danger to Others Potential: na  Are There Guns or Other Weapons in Your Home? No (denied)  Types of Guns/Weapons: na  Are These Weapons Safely Secured?                            -- (na)  Who Could Verify You Are Able To Have These Secured: na  Do You Have any Outstanding Charges, Pending Court Dates, Parole/Probation? denied  Contacted To Inform of Risk of Harm To Self or Others: -- (na)    Does Patient Present under Involuntary Commitment? Yes (Pt was IVC'd by the ED physician this morning (07/11/23))    [place] of Residence: [place]   Patient Currently Receiving the Following Services: Medication Management   Determination of Need: Emergent (2 hours) (  Per Dr. Enedina Finner, pt is recommended for Inpatient psychiatric treatment.)   Options For Referral: Inpatient Hospitalization     CCA Biopsychosocial Patient Reported Schizophrenia/Schizoaffective Diagnosis in Past: Yes   Strengths: UTA   Mental Health Symptoms Depression:  None   Duration of Depressive symptoms:    Mania:  None   Anxiety:   Irritability; Difficulty concentrating; Restlessness   Psychosis:  Delusions   Duration of Psychotic symptoms: Duration of Psychotic Symptoms: Less than six months   Trauma:  None   Obsessions:  None   Compulsions:  None   Inattention:  None   Hyperactivity/Impulsivity:  None   Oppositional/Defiant Behaviors:  None   Emotional Irregularity:  None   Other Mood/Personality Symptoms:  None noted    Mental Status Exam Appearance  and self-care  Stature:  Average   Weight:  Average weight   Clothing:  Casual (Scrubs)   Grooming:  Neglected   Cosmetic use:  Age appropriate   Posture/gait:  Bizarre; Tense   Motor activity:  Not Remarkable; Agitated   Sensorium  Attention:  Inattentive; Distractible; Confused   Concentration:  Variable; Scattered; Preoccupied   Orientation:  Person; Place   Recall/memory:  Defective in Short-term; Defective in Recent; Defective in Remote (UTA)   Affect and Mood  Affect:  Blunted; Flat   Mood:  Irritable   Relating  Eye contact:  Fleeting   Facial expression:  Angry (irritable)   Attitude toward examiner:  Resistant; Uninterested; Argumentative; Cooperative; Passive; Guarded (labile)   Thought and Language  Speech flow: Pressured; Paucity   Thought content:  Delusions   Preoccupation:  None   Hallucinations:  Auditory; Visual (Pt would not given details but stated she was having AVH.)   Organization:  Disorganized   Company secretary of Knowledge:  Poor   Intelligence:  Average   Abstraction:  Concrete   Judgement:  Impaired   Reality Testing:  Distorted   Insight:  Lacking   Decision Making:  Impulsive   Social Functioning  Social Maturity:  Irresponsible; Impulsive   Social Judgement:  Normal   Stress  Stressors:  -- Industrial/product designer)   Coping Ability:  -- Industrial/product designer)   Skill Deficits:  Decision making   Supports:  Support needed     Religion: Religion/Spirituality Are You A Religious Person?: No How Might This Affect Treatment?: N/a  Leisure/Recreation: Leisure / Recreation Do You Have Hobbies?: No  Exercise/Diet: Exercise/Diet Do You Exercise?: No Have You Gained or Lost A Significant Amount of Weight in the Past Six Months?: No Do You Follow a Special Diet?: No Do You Have Any Trouble Sleeping?: No   CCA Employment/Education Employment/Work Situation: Employment / Work Situation Employment Situation: On disability Why is  Patient on Disability: Pt stated that she was receiving Disability income due to her mental health condition. How Long has Patient Been on Disability: Pt did not given any further details. Patient's Job has Been Impacted by Current Illness: No Has Patient ever Been in the [employer]?: No  Education: Education Is Patient Currently Attending School?: No Last Grade Completed: 12 Did You Attend College?: No Did You Have An Individualized Education Program (IIEP): No Did You Have Any Difficulty At School?: No   CCA Family/Childhood History Family and Relationship History: Family history Marital status: Single Does patient have children?: No  Childhood History:  Childhood History By whom was/is the patient raised?: Both parents Did patient suffer any verbal/emotional/physical/sexual abuse as a child?: No Has patient ever been sexually  abused/assaulted/raped as an adolescent or adult?: No Witnessed domestic violence?: No Has patient been affected by domestic violence as an adult?: No       CCA Substance Use Alcohol/Drug Use: Alcohol / Drug Use Pain Medications: See MAR Prescriptions: See MAR Over the Counter: See MAR History of alcohol / drug use?: Yes (Pt denies although UDS is positive for cocaine and amphetamines this date) Longest period of sobriety (when/how long): Unknown Negative Consequences of Use:  (none reported) Withdrawal Symptoms:  (none reported) Substance #1 Name of Substance 1: Methamphetamine (Pt denied current use but tested positive in November, 2024.) 1 - Age of First Use: unknown 1 - Amount (size/oz): unknown 1 - Frequency: unknown 1 - Duration: unknown 1 - Last Use / Amount: November, 2024 1 - Method of Aquiring: unknown 1- Route of Use: unknown Substance #2 Name of Substance 2: cocaine 2 - Age of First Use: unknown 2 - Amount (size/oz): unknown 2 - Frequency: Pt would not answer. 2 - Duration: Pt would not answer. 2 - Last Use / Amount:  07/10/23 2 - Method of Aquiring: unknown 2 - Route of Substance Use: unknown Substance #3 Name of Substance 3: opiates (Hx of use. Pt denied current use.) 3 - Age of First Use: unknown 3 - Amount (size/oz): unknown 3 - Frequency: unknown 3 - Duration: unknown 3 - Last Use / Amount: Last report was 05/16/2021 per chart 3 - Method of Aquiring: unknown 3 - Route of Substance Use: unknown                   ASAM's:  Six Dimensions of Multidimensional Assessment  Dimension 1:  Acute Intoxication and/or Withdrawal Potential:   Dimension 1:  Description of individual's past and current experiences of substance use and withdrawal: Pt denies recent alcohol or substance use  Dimension 2:  Biomedical Conditions and Complications:   Dimension 2:  Description of patient's biomedical conditions and  complications: Pt denies recent alcohol or substance use and reported no medical conditions  Dimension 3:  Emotional, Behavioral, or Cognitive Conditions and Complications:  Dimension 3:  Description of emotional, behavioral, or cognitive conditions and complications: Pt denies recent alcohol or substance use  Dimension 4:  Readiness to Change:  Dimension 4:  Description of Readiness to Change criteria: Pt has had multiple psychotic episodes over years which may have been affected by her drug use.  Dimension 5:  Relapse, Continued use, or Continued Problem Potential:  Dimension 5:  Relapse, continued use, or continued problem potential critiera description: Pt denies recent alcohol or substance use  Dimension 6:  Recovery/Living Environment:  Dimension 6:  Recovery/Iiving environment criteria description: Pt denies recent alcohol or substance use  ASAM Severity Score: ASAM's Severity Rating Score: 12  ASAM Recommended Level of Treatment: ASAM Recommended Level of Treatment: Level II Partial Hospitalization Treatment   Substance use Disorder (SUD) Substance Use Disorder (SUD)  Checklist Symptoms of  Substance Use:  (Unable to assess due to pt's uncooperation.)  Recommendations for Services/Supports/Treatments: Recommendations for Services/Supports/Treatments Recommendations For Services/Supports/Treatments: CD-IOP Intensive Chemical Dependency Program (NA)  Disposition Recommendation per psychiatric provider: We recommend inpatient psychiatric hospitalization after medical hospitalization. Patient has been involuntarily committed on 07/11/23.    DSM5 Diagnoses: Patient Active Problem List   Diagnosis Date Noted   Schizoaffective disorder, depressive type (HCC) 12/29/2022   Suicidal ideations    Substance-induced disorder (HCC) 04/27/2021   Malingering 06/02/2020   Homicidal ideation 06/02/2020   Paranoia (HCC) 06/02/2020  Suicidal behavior 06/02/2020   Schizophrenia (HCC) 06/02/2020   Schizoaffective disorder (HCC) 05/08/2020   Schizophrenia, paranoid (HCC)    Cocaine dependence with cocaine-induced psychotic disorder with complication [facility])    Social anxiety disorder 09/25/2016   Substance induced mood disorder (HCC) 04/29/2016   Polysubstance abuse (HCC)    Dysmenorrhea 12/30/2013     Referrals to Alternative Service(s): Referred to Alternative Service(s):   Place:   Date:   Time:    Referred to Alternative Service(s):   Place:   Date:   Time:    Referred to Alternative Service(s):   Place:   Date:   Time:    Referred to Alternative Service(s):   Place:   Date:   Time:     Jaelan Rasheed T, Counselor
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
## 2023-07-11 NOTE — ED Notes (Signed)
Report was attempted to be given. No one answered callback number was given.

## 2023-07-11 NOTE — ED Notes (Signed)
Getting TTS ready at bedside and patient is about to be evaluated, and patient is now eating breakfast calmly and quietly.

## 2023-07-11 NOTE — Progress Notes (Signed)
Pt has been accepted to [facility] 07/11/2023 pending . Bed assignment: Main campus  Pt meets inpatient criteria per: Alona Bene, PMHNP   Attending Physician will be Loni Beckwith, MD  Report can be called to: [phone] (this is a pager, please leave call-back number when giving report)  Pt can arrive anytime   Care Team Notified:  Alona Bene, PMHNP Netta Corrigan RN    [place] Auri Jahnke LCSW-A   07/11/2023 3:27 PM
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
## 2023-07-11 NOTE — ED Provider Notes (Signed)
Emergency Medicine Observation Re-evaluation Note  Julia [name] is a 22 y.o. female, seen on rounds today.  Pt initially presented to the ED for complaints of Headache (Cough, "feels like something stuck in throat") Currently, the patient is resting quietly.  Physical Exam  BP (!) 133/90   Pulse (!) 105   Temp 98.3 F (36.8 C) (Oral)   Resp 15   Ht 5\' 6"  (1.676 m)   Wt 83 kg   LMP  (LMP Unknown)   SpO2 98%   BMI 29.53 kg/m  Physical Exam General: No acute distress Cardiac: Well-perfused Lungs: Nonlabored Psych: Calm  ED Course / MDM  EKG:EKG Interpretation Date/Time:  Friday July 11 2023 01:57:32 EST Ventricular Rate:  95 PR Interval:  181 QRS Duration:  90 QT Interval:  345 QTC Calculation: 434 R Axis:   35  Text Interpretation: Sinus rhythm Confirmed by Ross Marcus ([id number]) on 07/11/2023 2:31:40 AM  I have reviewed the labs performed to date as well as medications administered while in observation.  Recent changes in the last 24 hours include psychiatric evaluation.  Plan  Current plan is for inpatient psychiatric treatment.  Patient has been accepted to [facility] under Dr. Alysia Penna, Kayleen Memos, MD 07/11/23 [phone]

## 2023-07-11 NOTE — Progress Notes (Signed)
   07/11/23 1002  [place] Suicide Severity Rating Scale  1. In the past month - "Have you wished you were dead or wished you could go to sleep and not wake up?" No  2. In the past month - "Have you actually had any thoughts of killing yourself?" No  6. Have you ever done anything, started to do anything, or prepared to do anything to end your life?" No  C-SSRS RISK CATEGORY No Risk

## 2023-07-11 NOTE — ED Triage Notes (Addendum)
Pt walked to ED reporting bad headache, cough, and feeling like something is stuck in throat. Reports these sx have been going on for weeks. Dr Wilkie Aye walks in pt says "feels like stabbing in heart", points to epigastric region.

## 2023-07-11 NOTE — ED Notes (Signed)
Patient calm and resting in bed. Restraints removed and order discontinued at this time. Patient is alert and oriented. Staff explained to patient reasoning for removing restraints and expectations while out of them. Patient ambulated to bathroom for urine sample; returned to room with no issues.

## 2023-07-11 NOTE — ED Notes (Signed)
Pt is dressed out into scrubs, security has wanded pt and belongings placed in locker.
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
## 2023-07-11 NOTE — ED Notes (Signed)
Pt spits thermometer out of her mouth when nurse taking. Mumbles to self, when asked what she said, pt mumbles "nothing"

## 2023-07-11 NOTE — ED Notes (Signed)
Pt asked for klonopin. EDP was notified.

## 2023-08-08 ENCOUNTER — Emergency Department (HOSPITAL_COMMUNITY)
Admission: EM | Admit: 2023-08-08 | Discharge: 2023-08-11 | Disposition: A | Discharge status: Other Acute Inpatient Hospital | Payer: MEDICAID | Attending: Emergency Medicine | Admitting: Emergency Medicine

## 2023-08-08 ENCOUNTER — Other Ambulatory Visit: Payer: Self-pay

## 2023-08-08 ENCOUNTER — Encounter (HOSPITAL_COMMUNITY): Payer: Self-pay

## 2023-08-08 DIAGNOSIS — F22 Delusional disorders: Secondary | ICD-10-CM | POA: Diagnosis not present

## 2023-08-08 DIAGNOSIS — Z79899 Other long term (current) drug therapy: Secondary | ICD-10-CM | POA: Diagnosis not present

## 2023-08-08 DIAGNOSIS — F29 Unspecified psychosis not due to a substance or known physiological condition: Secondary | ICD-10-CM | POA: Diagnosis not present

## 2023-08-08 DIAGNOSIS — F251 Schizoaffective disorder, depressive type: Secondary | ICD-10-CM | POA: Insufficient documentation

## 2023-08-08 DIAGNOSIS — F69 Unspecified disorder of adult personality and behavior: Secondary | ICD-10-CM

## 2023-08-08 DIAGNOSIS — F32A Depression, unspecified: Secondary | ICD-10-CM | POA: Diagnosis present

## 2023-08-08 LAB — RAPID URINE DRUG SCREEN, HOSP PERFORMED
Amphetamines: NOT DETECTED
Barbiturates: NOT DETECTED
Benzodiazepines: NOT DETECTED
Cocaine: POSITIVE — AB
Opiates: NOT DETECTED
Tetrahydrocannabinol: NOT DETECTED

## 2023-08-08 LAB — POC URINE PREG, ED: Preg Test, Ur: NEGATIVE

## 2023-08-08 MED ORDER — CLONAZEPAM 0.5 MG PO TABS
1.0000 mg | ORAL_TABLET | Freq: Once | ORAL | Status: AC
Start: 2023-08-08 — End: 2023-08-08
  Administered 2023-08-08: 1 mg via ORAL
  Filled 2023-08-08: qty 2

## 2023-08-08 MED ORDER — IBUPROFEN 400 MG PO TABS
400.0000 mg | ORAL_TABLET | Freq: Once | ORAL | Status: AC
  Administered 2023-08-08: 400 mg via ORAL
  Filled 2023-08-08: qty 1

## 2023-08-08 MED ORDER — LOXAPINE SUCCINATE 25 MG PO CAPS
25.0000 mg | ORAL_CAPSULE | Freq: Every day | ORAL | Status: DC
Start: 2023-08-08 — End: 2023-08-08

## 2023-08-08 MED ORDER — CLONAZEPAM 0.5 MG PO TABS
1.0000 mg | ORAL_TABLET | Freq: Three times a day (TID) | ORAL | Status: DC | PRN
  Administered 2023-08-08 – 2023-08-11 (×8): 1 mg via ORAL
  Filled 2023-08-08 (×8): qty 2

## 2023-08-08 MED ORDER — QUETIAPINE FUMARATE 100 MG PO TABS
200.0000 mg | ORAL_TABLET | Freq: Every day | ORAL | Status: DC
  Administered 2023-08-08 – 2023-08-10 (×3): 200 mg via ORAL
  Filled 2023-08-08 (×3): qty 2

## 2023-08-08 MED ORDER — RISPERIDONE 1 MG PO TABS
2.0000 mg | ORAL_TABLET | Freq: Two times a day (BID) | ORAL | Status: DC
  Administered 2023-08-09 – 2023-08-11 (×3): 2 mg via ORAL
  Filled 2023-08-08 (×10): qty 2

## 2023-08-08 NOTE — ED Notes (Signed)
[employer] dropped off IVC paper work for pt. MD aware

## 2023-08-08 NOTE — ED Notes (Addendum)
Pt refusing blood work at this time states "I am sick, I said I don't have any blood to give", pt has not been served IVC paperwork by [employer] at this time. Awaiting IVC paperwork that was taken out by family to be served to pt by [employer]

## 2023-08-08 NOTE — ED Notes (Addendum)
Received report from [place], Charity fundraiser. Patient cursing and hollering and told RN to "leave me the fuck alone." Sitter at bedside,

## 2023-08-08 NOTE — ED Notes (Signed)
Pts belongings locked up

## 2023-08-08 NOTE — ED Notes (Signed)
Patient reports increasing anxiety.  Medicated per PRN order.  Updated patient on plan of care.  Pt currently calm and cooperative

## 2023-08-08 NOTE — ED Notes (Signed)
Pt taking her prescribed Risperdal and Folic acid.

## 2023-08-08 NOTE — ED Notes (Signed)
Pt currently screaming for her clothes and asking for a doc. Once told that a doc would see her soon, she became calm again.

## 2023-08-08 NOTE — ED Triage Notes (Signed)
Pov from home cc of "needing a brain scan" says that she feels like her brain is turning into mush.

## 2023-08-08 NOTE — ED Notes (Signed)
Pt provided with crackers and peanut butter per request.

## 2023-08-08 NOTE — ED Notes (Signed)
Pt given a sandwich per pt request. Diet order, finger foods, placed for pt by assigned RN

## 2023-08-08 NOTE — ED Notes (Signed)
Pt refuses blood work and risperdal. EDP made aware

## 2023-08-08 NOTE — ED Notes (Signed)
Patient complaining of "pain in a square on my head."  Wanting to speak to MD about concerns.  Patient reassured at this time that she is being monitored.  Will continue to monitor

## 2023-08-08 NOTE — ED Notes (Signed)
Pt provided crackers and water

## 2023-08-08 NOTE — ED Provider Notes (Signed)
[place] EMERGENCY DEPARTMENT AT [facility] Provider Note   CSN: [id number] Arrival date & time: 08/08/23  0547     History  Chief Complaint  Patient presents with   Headache    Julia [name] is a 23 y.o. female.  Presents to the emergency department stating that her brain is turning to mush.  She reports that she is certain that she has cancer.  She reports that her grandmother had cancer and she "knows what it looks like".  Patient asking how she can "check-in upstairs".  Patient concerned that something came out of her spleen.       Home Medications Prior to Admission medications   Medication Sig Start Date End Date Taking? Authorizing Provider  clonazePAM (KLONOPIN) 0.5 MG tablet Take 0.5 mg by mouth in the morning, at noon, and at bedtime. Patient not taking: Reported on 07/11/2023    [provider]  clonazePAM (KLONOPIN) 1 MG tablet Take 1 mg by mouth 3 (three) times daily as needed.    [provider]  FLUoxetine (PROZAC) 20 MG capsule Take 1 capsule (20 mg total) by mouth daily. 01/03/23 07/11/23  Princess Bruins, DO  loxapine (LOXITANE) 25 MG capsule Take 25 mg by mouth daily. 07/08/23   [provider]  QUEtiapine (SEROQUEL) 200 MG tablet Take 200 mg by mouth at bedtime. 04/16/23   [provider]  risperiDONE (RISPERDAL) 3 MG tablet Take 1 tablet (3 mg total) by mouth at bedtime. Patient not taking: Reported on 05/17/2023 01/03/23 04/05/23  Princess Bruins, DO  sertraline (ZOLOFT) 50 MG tablet Take 50 mg by mouth daily. Patient not taking: Reported on 07/11/2023 05/26/23   [provider]      Allergies    Abilify [aripiprazole], Haldol [haloperidol], and Tylenol [acetaminophen]    Review of Systems   Review of Systems  Physical Exam Updated Vital Signs BP 101/69   Pulse 100   Temp 97.6 F (36.4 C)   Resp 16   Ht 5\' 6"  (1.676 m)   Wt 83 kg   LMP 07/18/2023   SpO2 100%   BMI 29.53 kg/m  Physical  Exam Vitals and nursing note reviewed.  Constitutional:      General: She is not in acute distress.    Appearance: She is well-developed.  HENT:     Head: Normocephalic and atraumatic.     Mouth/Throat:     Mouth: Mucous membranes are moist.  Eyes:     General: Vision grossly intact. Gaze aligned appropriately.     Extraocular Movements: Extraocular movements intact.     Conjunctiva/sclera: Conjunctivae normal.  Cardiovascular:     Rate and Rhythm: Normal rate and regular rhythm.     Pulses: Normal pulses.     Heart sounds: Normal heart sounds, S1 normal and S2 normal. No murmur heard.    No friction rub. No gallop.  Pulmonary:     Effort: Pulmonary effort is normal. No respiratory distress.     Breath sounds: Normal breath sounds.  Abdominal:     General: Bowel sounds are normal.     Palpations: Abdomen is soft.     Tenderness: There is no abdominal tenderness. There is no guarding or rebound.     Hernia: No hernia is present.  Musculoskeletal:        General: No swelling.     Cervical back: Full passive range of motion without pain, normal range of motion and neck supple. No spinous process tenderness or  muscular tenderness. Normal range of motion.     Right lower leg: No edema.     Left lower leg: No edema.  Skin:    General: Skin is warm and dry.     Capillary Refill: Capillary refill takes less than 2 seconds.     Findings: No ecchymosis, erythema, rash or wound.  Neurological:     General: No focal deficit present.     Mental Status: She is alert and oriented to person, place, and time.     GCS: GCS eye subscore is 4. GCS verbal subscore is 5. GCS motor subscore is 6.     Cranial Nerves: Cranial nerves 2-12 are intact.     Sensory: Sensation is intact.     Motor: Motor function is intact.     Coordination: Coordination is intact.  Psychiatric:        Attention and Perception: Attention normal.        Mood and Affect: Mood normal.        Speech: Speech is  tangential.        Behavior: Behavior is withdrawn.        Thought Content: Thought content is paranoid and delusional.     ED Results / Procedures / Treatments   Labs (all labs ordered are listed, but only abnormal results are displayed) Labs Reviewed - No data to display  EKG None  Radiology No results found.  Procedures Procedures    Medications Ordered in ED Medications - No data to display  ED Course/ Medical Decision Making/ A&P                                 Medical Decision Making  Patient with significant psychiatric illness presents to the emergency department expressing multiple somatic concerns.  She is erratic in her speech patterns and topics.  She appears paranoid and is delusional.  Family has taken out IVC paperwork on her because of her psychotic behavior.        Final Clinical Impression(s) / ED Diagnoses Final diagnoses:  Psychosis, unspecified psychosis type [facility])    Rx / DC Orders ED Discharge Orders     None         Caven Perine, Canary Brim, MD 08/08/23 [phone]

## 2023-08-09 LAB — CBC WITH DIFFERENTIAL/PLATELET
Abs Immature Granulocytes: 0.08 10*3/uL — ABNORMAL HIGH (ref 0.00–0.07)
Basophils Absolute: 0 10*3/uL (ref 0.0–0.1)
Basophils Relative: 0 %
Eosinophils Absolute: 0.1 10*3/uL (ref 0.0–0.5)
Eosinophils Relative: 1 %
HCT: 46.2 % — ABNORMAL HIGH (ref 36.0–46.0)
Hemoglobin: 14.7 g/dL (ref 12.0–15.0)
Immature Granulocytes: 1 %
Lymphocytes Relative: 44 %
Lymphs Abs: 5 10*3/uL — ABNORMAL HIGH (ref 0.7–4.0)
MCH: 28.3 pg (ref 26.0–34.0)
MCHC: 31.8 g/dL (ref 30.0–36.0)
MCV: 88.8 fL (ref 80.0–100.0)
Monocytes Absolute: 0.8 10*3/uL (ref 0.1–1.0)
Monocytes Relative: 7 %
Neutro Abs: 5.4 10*3/uL (ref 1.7–7.7)
Neutrophils Relative %: 47 %
Platelets: 279 10*3/uL (ref 150–400)
RBC: 5.2 MIL/uL — ABNORMAL HIGH (ref 3.87–5.11)
RDW: 14.1 % (ref 11.5–15.5)
WBC: 11.3 10*3/uL — ABNORMAL HIGH (ref 4.0–10.5)
nRBC: 0 % (ref 0.0–0.2)

## 2023-08-09 LAB — COMPREHENSIVE METABOLIC PANEL
ALT: 19 U/L (ref 0–44)
AST: 20 U/L (ref 15–41)
Albumin: 3.8 g/dL (ref 3.5–5.0)
Alkaline Phosphatase: 62 U/L (ref 38–126)
Anion gap: 11 (ref 5–15)
BUN: 10 mg/dL (ref 6–20)
CO2: 23 mmol/L (ref 22–32)
Calcium: 9.5 mg/dL (ref 8.9–10.3)
Chloride: 105 mmol/L (ref 98–111)
Creatinine, Ser: 0.84 mg/dL (ref 0.44–1.00)
GFR, Estimated: >60 mL/min (ref >60)
Glucose, Bld: 92 mg/dL (ref 70–99)
Potassium: 5.3 mmol/L — ABNORMAL HIGH (ref 3.5–5.1)
Sodium: 139 mmol/L (ref 135–145)
Total Bilirubin: 0.4 mg/dL (ref 0.0–1.2)
Total Protein: 7.4 g/dL (ref 6.5–8.1)

## 2023-08-09 LAB — ETHANOL: Alcohol, Ethyl (B): <10 mg/dL (ref <10)

## 2023-08-09 MED ORDER — OLANZAPINE 10 MG IM SOLR
5.0000 mg | Freq: Once | INTRAMUSCULAR | Status: AC
  Administered 2023-08-09: 5 mg via INTRAMUSCULAR
  Filled 2023-08-09: qty 10

## 2023-08-09 MED ORDER — LORAZEPAM 2 MG/ML IJ SOLN
1.0000 mg | Freq: Once | INTRAMUSCULAR | Status: AC
  Administered 2023-08-09: 1 mg via INTRAMUSCULAR
  Filled 2023-08-09: qty 1

## 2023-08-09 MED ORDER — STERILE WATER FOR INJECTION IJ SOLN
INTRAMUSCULAR | Status: AC
  Filled 2023-08-09: qty 10

## 2023-08-09 NOTE — ED Notes (Signed)
[facility] have attempted to receive information on this pt for possible placement in their facilities. Pt refuses lab work at this time so she can not be considered until she agrees to do so. EDP made ware. Social work made Conservator, museum/gallery. Will follow up.

## 2023-08-09 NOTE — ED Notes (Signed)
Pt refused lab work again. Charge nurse and edp aware

## 2023-08-09 NOTE — ED Notes (Signed)
This tech attempted to get vitals and pt was refusing.

## 2023-08-09 NOTE — Progress Notes (Signed)
Patient has been denied by [facility] and has been faxed. Patient meets BH inpatient criteria per Roselyn Bering, NP. Patient has been faxed out to the following facilities:   [facility] [address], [place] [address] [phone]  [facility] [address] [place], [place] [address] [phone]  [facility] [address] [place] [address] [phone]  [facility] [address], [place] [address] [phone]  [facility] [address] [place] [address] [phone]  [facility] [address] [place], [place] [address] [phone]  [facility] [address] [place] [address] [phone]  [facility] [address] [place] [place] [address] [phone]  [facility] [address], [place] [address] [phone]  [facility] [address] [place] [address] [phone]  [facility] [address], [place] [address] [phone]  [facility] [address] [place] [address] [phone]  [facility] [place] [phone]  [facility] [address] [place] [address] [phone]  [facility] EFAX [address] [name] [place] [phone]  [facility] [address] [place] [address] [phone]  [facility] [address], [place] [address] [phone]  [facility] [address], [place] [address] [phone]  [facility] [address],  [place] [address] [phone]  [facility] [address] [place], [place] [address] [phone]  [facility] [address] [place] [address] [phone]  [facility] [address], [place] [address] [phone]  [facility] Inpatient [facility] [place] [phone]  [facility] [address] [place], [place] [address] [phone]    Damita Dunnings, MSW, LCSW-A  10:24 AM 08/09/2023
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
## 2023-08-09 NOTE — Progress Notes (Signed)
LCSW Progress Note  [id number]   CHELSEE HOSIE  08/09/2023  2:44 AM    Inpatient Behavioral Health Placement  Pt meets inpatient criteria per Roselyn Bering, NP. There are no available beds within [facility] per [facility] Sharyne Peach, RN  Referral was sent to the following facilities;   Destination  Service Provider Address Phone Fax  [facility] [address], [place] [address] [phone]  [facility] [address] [place], [place] [address] [phone]  [facility] [address] [place] [address] [phone]  [facility] [address], [place] [address] [phone]  [facility] [address] [place] [address] [phone]  [facility] [address] [place], [place] [address] [phone]  [facility] [address] [place] [address] [phone]  [facility] [address] [place] [address] [phone]  [facility] [address], [place] [address] [phone]  [facility] [address], [place] [address] [phone]  [facility] [address], [place] [address] [phone]  [facility] [address] [place] [address] [phone]  [facility] [place] [phone]  [facility] [address] [place] [address] [phone]  [facility] EFAX [address] [name] [place] [phone]  [facility] [address], [place] [address] [phone]  [facility] [address], [place] [address] [phone]  [facility] [address] [place] [address] [phone]  [facility] [address] [place] [address] [phone]  [facility] [address] [place], [place] [address] [phone]  [facility] [address] [place] [address] [phone]  [facility] [address] [place] [address] [phone]  [facility]iatry Inpatient [facility] [place] [phone]  [facility] [address] [place], [place] [address] [phone]    Situation ongoing,  CSW will follow up.    Maryjean Ka, MSW, [facility] 08/09/2023 2:44 AM

## 2023-08-09 NOTE — ED Provider Notes (Signed)
Emergency Medicine Observation Re-evaluation Note  Julia [name] is a 23 y.o. female, seen on rounds today.  Pt initially presented to the ED for complaints of Headache Currently, the patient is resting  Physical Exam  BP 100/71   Pulse 89   Temp 98.2 F (36.8 C) (Oral)   Resp 16   Ht 5\' 6"  (1.676 m)   Wt 83 kg   LMP 07/18/2023   SpO2 98%   BMI 29.53 kg/m  Physical Exam General: NAD Cardiac: RR Lungs: Normal WOB  Psych: oppositional   ED Course / MDM  EKG:   I have reviewed the labs performed to date as well as medications administered while in observation.  Recent changes in the last 24 hours include labs obtained. Patient also agitated this morning after rounds needing restraints  Plan  Current plan is for inpatient psych;.    Coral Spikes, DO 08/09/23 [phone]

## 2023-08-09 NOTE — Progress Notes (Signed)
LCSW Progress Note  [id number]   Julia [name]  08/09/2023  6:59 PM    Inpatient Behavioral Health Placement  Pt meets inpatient criteria per Roselyn Bering, NP. Referral was sent to the following facilities;   Destination  Service Provider Address Phone  Fax  [facility] [address], [place] [address] [phone]  [facility] [address] [place], [place] [address] [phone]  [facility] [address] [place] [address] [phone]  [facility] [address] [place] [address] [phone]  [facility] [address] [place] [address] [phone]  [facility]Adult [address] [place], [place] [address] [phone]  [facility] [address] [place] [address] [phone]  [facility] [address] [place] [address] [phone]  [facility] [address], [place] [address] [phone]  [facility] [address] [place] [address] [phone]  [facility] [address] [place] [address] [phone]  [facility] [address], [place] [address] [phone]  [facility] BED Management [facility] [place] [phone]  [facility] [address] [place] [address] [phone]  [facility] EFAX [address] [name] [phone]  [facility] [address], [place] [address] [phone]  [facility] [address], [place] [address] [phone]  [facility] [address] [place] [address] [phone]  [facility] [address] [place] [address] [phone]  [facility] [address] [place], [place] [address] [phone]  [facility] [address] [place] [address] [phone]  [facility] [address] [place] [address] [phone]  [facility] Psychiatry Inpatient [facility] [place] [phone]  [facility] [address] [place], [place] [address] [phone]    Situation ongoing,  CSW will follow up.    Maryjean Ka, MSW, [facility] 08/09/2023 6:59 PM

## 2023-08-09 NOTE — ED Notes (Signed)
Pt has equal rise and fall of chest while sleeping. Pt has been removed from forensic restraints by Psychologist, forensic.
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
## 2023-08-09 NOTE — ED Notes (Signed)
AC called and stated AC at behavioral health is ok with pt being arrested if IVC is rescinded. EDP not willing to rescind unless Pysch Dr approve.

## 2023-08-09 NOTE — ED Notes (Signed)
[employer] at bedside
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
## 2023-08-09 NOTE — BH Assessment (Signed)
Comprehensive Clinical Assessment (CCA) Note  08/09/2023 Julia [name] [id number]  Disposition: Roselyn Bering, NP, recommends inpatient treatment. Disposition SW to secure placement.   The patient demonstrates the following risk factors for suicide: Chronic risk factors for suicide include: psychiatric disorder of schizoaffective disorder, depressive type . Acute risk factors for suicide include: family or marital conflict, unemployment, loss (financial, interpersonal, professional), and recent discharge from inpatient psychiatry. Protective factors for this patient include: positive social support and hope for the future. Considering these factors, the overall suicide risk at this point appears to be high. Patient is not appropriate for outpatient follow up.  Julia [name] is a 23 year old female presenting to [facility] due to requesting a brain scan. Patient has history of Schizoaffective disorder, depressive type. Patient denied SI, HI, psychosis and alcohol/drug usage. Patient states "I need a brain scan, my brain is at a level of pain, its been this way for 4 yrs now". Patient then states, "it keeps working me, trying to get information". When asked, who is trying to get information, patient states "I don't know". Patient states "something came out of my spleen", no additional information provided. When asked about her diagnosis, patient states "a whole bunch of them that is not true". Patient denied being depressed. Patient reported 8-10 hours sleep and normal appetite.   Patient denied receiving any outpatient therapy. Patient reports taking Klonopin and Seroquel as prescribed. Patient feels that medications are working. Patient can not recall the name of her medication management provider. Patient denied prior suicide attempts and self-harming behaviors.   Per chart review, patient history of psychiatric inpatient treatment including, 07/2023 (2 weeks ago) [facility], 05/2023 [facility], [facility]/2023 [facility], [address] 07/2022 [facility], [facility]/20[facility]. When asked about reason for most recent inpatient stay, patient states "I don't know".   Patient reports living with friends. Patient is unemployed. Patient reported having no family support. Patient denied access to guns. Patient was calm and not forthcoming with information.    Chief Complaint:  Chief Complaint  Patient presents with   Headache   Visit Diagnosis: Schizoaffective disorder, depressive type    CCA Screening, Triage and Referral (STR)  Patient Reported Information How did you hear about [place]? Legal System  What Is the Reason for Your Visit/Call Today? "I need a brain scan"  How Long Has This Been Causing You Problems? > than 6 months  What Do You Feel Would Help You the Most Today? Treatment for Depression or other mood problem   Have You Recently Had Any Thoughts About Hurting Yourself? No  Are You Planning to Commit Suicide/Harm Yourself At This time? No   Flowsheet Row ED from 08/08/2023 in [facility] Department at [facility] ED from [facility]/11/2023 in [facility] ED from [facility]17/2023 in [facility]  C-SSRS RISK CATEGORY No Risk No Risk No Risk       Have you Recently Had Thoughts About Hurting Someone Julia [name]? No  Are You Planning to Harm Someone at This Time? No  Explanation: n/a   Have You Used Any Alcohol or Drugs in the Past 24 Hours? No  How Long Ago Did You Use Drugs or Alcohol? N/a What Did You Use and How Much? n/a   Do You Currently Have a Therapist/Psychiatrist? No  Name of Therapist/Psychiatrist: Name of Therapist/Psychiatrist: n/a   Have You Been Recently Discharged From Any Office Practice or  Programs? Yes  Explanation of Discharge From Practice/Program: 2 weeks ago [facility]     CCA Screening Triage Referral Assessment Type of Contact: Tele-Assessment  Telemedicine  Service Delivery: Telemedicine service delivery: This service was provided via telemedicine using a 2-way, interactive audio and video technology  Is this Initial or Reassessment? Is this Initial or Reassessment?: Initial Assessment  Date Telepsych consult ordered in [facility]:  Date Telepsych consult ordered in [facility]: 08/08/23  Time Telepsych consult ordered in [facility]:  Time Telepsych consult ordered in [facility]: 1441  Location of Assessment: [facility]  Provider Location: [facility]   Collateral Involvement: none reported   Does Patient Have a Automotive engineer Guardian? No  Legal Guardian Contact Information: n/a  Copy of Legal Guardianship Form: -- (n/a)  Legal Guardian Notified of Arrival: -- (n/a)  Legal Guardian Notified of Pending Discharge: -- (n/a)  If Minor and Not Living with Parent(s), Who has Custody? n/a  Is CPS involved or ever been involved? Never  Is APS involved or ever been involved? Never   Patient Determined To Be At Risk for Harm To Self or Others Based on Review of Patient Reported Information or Presenting Complaint? No  Method: No Plan  Availability of Means: No access or NA  Intent: Vague intent or NA  Notification Required: No need or identified person  Additional Information for Danger to Others Potential: -- (n/a)  Additional Comments for Danger to Others Potential: n/a  Are There Guns or Other Weapons in Your Home? No  Types of Guns/Weapons: n/a  Are These Weapons Safely Secured?                            -- (n/a)  Who Could Verify You Are Able To Have These Secured: n/a  Do You Have any Outstanding Charges, Pending Court Dates, Parole/Probation? none reported  Contacted To Inform of Risk of Harm To Self or Others: Other: Comment    Does Patient Present under Involuntary Commitment? Yes    [place] of Residence: [place]   Patient Currently Receiving the Following Services: Not Receiving Services   Determination of  Need: Urgent (48 hours)   Options For Referral: Outpatient Therapy; Medication Management; Inpatient Hospitalization     CCA Biopsychosocial Patient Reported Schizophrenia/Schizoaffective Diagnosis in Past: No   Strengths: self-awareness   Mental Health Symptoms Depression:  None   Duration of Depressive symptoms:    Mania:  None   Anxiety:   Difficulty concentrating; Restlessness   Psychosis:  Delusions   Duration of Psychotic symptoms: Duration of Psychotic Symptoms: Greater than six months   Trauma:  None   Obsessions:  None   Compulsions:  None   Inattention:  None   Hyperactivity/Impulsivity:  None   Oppositional/Defiant Behaviors:  None   Emotional Irregularity:  None   Other Mood/Personality Symptoms:  None noted    Mental Status Exam Appearance and self-care  Stature:  Average   Weight:  Average weight   Clothing:  Casual   Grooming:  Normal   Cosmetic use:  Age appropriate   Posture/gait:  Normal   Motor activity:  Not Remarkable   Sensorium  Attention:  Confused   Concentration:  Variable   Orientation:  X5   Recall/memory:  Defective in Short-term; Defective in Recent; Defective in Remote   Affect and Mood  Affect:  Appropriate   Mood:  Depressed   Relating  Eye contact:  Normal  Facial expression:  Responsive (irritable)   Attitude toward examiner:  Cooperative; Guarded; Passive (labile)   Thought and Language  Speech flow: Paucity   Thought content:  Delusions   Preoccupation:  None   Hallucinations:  None   Organization:  Disorganized   Company secretary of Knowledge:  Fair   Intelligence:  Average   Abstraction:  Concrete   Judgement:  Impaired   Reality Testing:  Distorted   Insight:  Lacking   Decision Making:  Impulsive   Social Functioning  Social Maturity:  Irresponsible; Impulsive   Social Judgement:  Normal   Stress  Stressors:  Transitions; Housing   Coping Ability:   Overwhelmed   Skill Deficits:  Scientist, physiological; Self-control   Supports:  Support needed     Religion: Religion/Spirituality Are You A Religious Person?: No How Might This Affect Treatment?: N/a  Leisure/Recreation: Leisure / Recreation Do You Have Hobbies?: No  Exercise/Diet: Exercise/Diet Do You Exercise?: No Have You Gained or Lost A Significant Amount of Weight in the Past Six Months?: No Do You Follow a Special Diet?: No Do You Have Any Trouble Sleeping?: No   CCA Employment/Education Employment/Work Situation: Employment / Work Situation Employment Situation: On disability Why is Patient on Disability: Pt stated that she was receiving Disability income due to her mental health condition. How Long has Patient Been on Disability: Pt did not given any further details. Patient's Job has Been Impacted by Current Illness: No Has Patient ever Been in the [employer]?: No  Education: Education Is Patient Currently Attending School?: No Last Grade Completed: 12 Did You Attend College?: No Did You Have An Individualized Education Program (IIEP): No Did You Have Any Difficulty At School?: No Patient's Education Has Been Impacted by Current Illness: No   CCA Family/Childhood History Family and Relationship History: Family history Marital status: Single Does patient have children?: No  Childhood History:  Childhood History By whom was/is the patient raised?: Both parents Did patient suffer any verbal/emotional/physical/sexual abuse as a child?: No Did patient suffer from severe childhood neglect?: No Has patient ever been sexually abused/assaulted/raped as an adolescent or adult?: No Was the patient ever a victim of a crime or a disaster?: No Witnessed domestic violence?: No Has patient been affected by domestic violence as an adult?: No       CCA Substance Use Alcohol/Drug Use: Alcohol / Drug Use Pain Medications: See MAR Prescriptions: See MAR Over the  Counter: See MAR History of alcohol / drug use?: Yes (Pt denies although UDS is positive for cocaine) Longest period of sobriety (when/how long): Unknown Negative Consequences of Use:  (none reported) Withdrawal Symptoms:  (none reported) Substance #1 Name of Substance 1: refused to answer 1 - Age of First Use: n/a 1 - Amount (size/oz): n/a 1 - Frequency: n/a 1 - Duration: n/a 1 - Last Use / Amount: n/a 1 - Method of Aquiring: n/a 1- Route of Use: n/a Substance #2 Name of Substance 2: n/a 2 - Age of First Use: n/a 2 - Amount (size/oz): n/a 2 - Frequency: n/a 2 - Duration: n/a 2 - Last Use / Amount: n/a 2 - Method of Aquiring: n/a 2 - Route of Substance Use: n/a Substance #3 Name of Substance 3: n/a 3 - Age of First Use: n/a 3 - Amount (size/oz): n/a 3 - Frequency: n/a 3 - Duration: n/a 3 - Last Use / Amount: n/a 3 - Method of Aquiring: n/a 3 - Route of Substance Use: n/a  ASAM's:  Six Dimensions of Multidimensional Assessment  Dimension 1:  Acute Intoxication and/or Withdrawal Potential:   Dimension 1:  Description of individual's past and current experiences of substance use and withdrawal: Pt denies recent alcohol or substance use  Dimension 2:  Biomedical Conditions and Complications:   Dimension 2:  Description of patient's biomedical conditions and  complications: Pt denies recent alcohol or substance use and reported no medical conditions  Dimension 3:  Emotional, Behavioral, or Cognitive Conditions and Complications:  Dimension 3:  Description of emotional, behavioral, or cognitive conditions and complications: Pt denies recent alcohol or substance use  Dimension 4:  Readiness to Change:  Dimension 4:  Description of Readiness to Change criteria: Pt has had multiple psychotic episodes over years which may have been affected by her drug use, per history  Dimension 5:  Relapse, Continued use, or Continued Problem Potential:  Dimension 5:   Relapse, continued use, or continued problem potential critiera description: Pt denies recent alcohol or substance use  Dimension 6:  Recovery/Living Environment:  Dimension 6:  Recovery/Iiving environment criteria description: Pt denies recent alcohol or substance use  ASAM Severity Score: ASAM's Severity Rating Score: 9  ASAM Recommended Level of Treatment: ASAM Recommended Level of Treatment: Level II Partial Hospitalization Treatment   Substance use Disorder (SUD) Substance Use Disorder (SUD)  Checklist Symptoms of Substance Use:  (Unable to assess due to pt's uncooperation.)  Recommendations for Services/Supports/Treatments: Recommendations for Services/Supports/Treatments Recommendations For Services/Supports/Treatments: CD-IOP Intensive Chemical Dependency Program, Individual Therapy, Inpatient Hospitalization, Facility Based Crisis, Medication Management (NA)  Disposition Recommendation per psychiatric provider:  Inpatient psychiatric treatment   DSM5 Diagnoses: Patient Active Problem List   Diagnosis Date Noted   Schizoaffective disorder, depressive type (HCC) 12/29/2022   Suicidal ideations    Substance-induced disorder (HCC) 04/27/2021   Malingering 06/02/2020   Homicidal ideation 06/02/2020   Paranoia (HCC) 06/02/2020   Suicidal behavior 06/02/2020   Schizophrenia (HCC) 06/02/2020   Schizoaffective disorder (HCC) 05/08/2020   Schizophrenia, paranoid (HCC)    Cocaine dependence with cocaine-induced psychotic disorder with complication (HCC)    Social anxiety disorder 09/25/2016   Substance induced mood disorder (HCC) 04/29/2016   Polysubstance abuse (HCC)    Dysmenorrhea 12/30/2013     Referrals to Alternative Service(s): Referred to Alternative Service(s):   Place:   Date:   Time:    Referred to Alternative Service(s):   Place:   Date:   Time:    Referred to Alternative Service(s):   Place:   Date:   Time:    Referred to Alternative Service(s):   Place:   Date:    Time:     Burnetta Sabin, [facility]

## 2023-08-09 NOTE — ED Notes (Signed)
Lab attempted to draw pts labs again. Pt still refuses.

## 2023-08-10 MED ORDER — NICOTINE 14 MG/24HR TD PT24
14.0000 mg | MEDICATED_PATCH | Freq: Once | TRANSDERMAL | Status: DC
  Administered 2023-08-10: 14 mg via TRANSDERMAL
  Filled 2023-08-10: qty 1

## 2023-08-10 MED ORDER — ZIPRASIDONE MESYLATE 20 MG IM SOLR: 10.0000 mg | Freq: Once | INTRAMUSCULAR | Status: DC

## 2023-08-10 MED ORDER — NICOTINE POLACRILEX 2 MG MT GUM
2.0000 mg | CHEWING_GUM | OROMUCOSAL | Status: DC | PRN
  Administered 2023-08-10 (×4): 2 mg via ORAL
  Filled 2023-08-10 (×8): qty 1

## 2023-08-10 NOTE — ED Notes (Signed)
Has been decided that pt will stay in this ED another night prior to possible transfer out to jail or another facility.

## 2023-08-10 NOTE — ED Notes (Signed)
Patient made a phone call to see if her belongings was brought in.

## 2023-08-10 NOTE — Progress Notes (Signed)
CSW spoke with the Intake RN at [employer] regarding insurance coverage. Per the Intake RN, the patient's insurance is invalid and therefore acceptance has been cancelled. CSW will informed ED RN staff and leave notice for the dayshift CSW to follow-up in the AM.    Damita Dunnings, MSW, LCSW-A  7:03 PM 08/10/2023

## 2023-08-10 NOTE — ED Notes (Signed)
Faxed IVC papers and chart to [facility] .  Faxed to [phone] to [facility].

## 2023-08-10 NOTE — ED Notes (Signed)
Pt c/o bright lights and headache. States she has been needing a brain scan for some time. States the pain is mostly around the base of the back of her head on left side.

## 2023-08-10 NOTE — Progress Notes (Signed)
Patient has been denied by [facility] due to no appropriate beds available. Patient meets BH inpatient criteria per Roselyn Bering, NP. Patient has been faxed out to the following facilities:   [facility] [address] [place] [address] [phone]  [facility] [address] [place], [place] [address] [phone]  [facility] [address] [place] [address] [phone]  [facility] [address], [place] [address] [phone]  [facility] [address] [place] [address] [phone]  [facility] [address] [place], [place] [address] [phone]  [name] [address] [place] [address] [phone]  [facility] [address] [place] [address] [phone]  [facility] [address], [place] [address] [phone]  [facility] [address] [place] [address] [phone]  [facility] [address], [place] [address] [phone]  [facility] [address], [place] [address] [phone]  [facility] [place] [phone]  [facility] [address] [place] [address] [place] [address] [phone]  [facility] EFAX [address] [name] [phone]  [facility] [address] [place] [address] [phone]  [facility] [address], [place] [address] [phone]  [facility] [name] [place] [address] [phone]  [facility] [address], [place] [address] [phone]  [facility] [address] [place], [place] [address] [phone]  [facility] [address] [place] [address] [phone]  [facility] [address] [place] [address] [phone]  [facility] [place] [phone]  [facility] [address] [place], [place] [address] [phone]  [facility] Patient Placement [facility], [place] [phone]  [facility] [address] [place] [address] [phone]  [facility] [address] [place] [address] [phone]   Damita Dunnings, MSW, LCSW-A  2:15 PM 08/10/2023

## 2023-08-10 NOTE — ED Notes (Signed)
Patient was laughing and talking to herself in room.

## 2023-08-10 NOTE — Progress Notes (Signed)
[facility] LCSW Progress Note   08/10/2023    2:33 PM  [name]   [id number]   Type of Contact and Topic:  Psychiatric Bed Placement   [facility]      Patient meets inpatient criteria per Roselyn Bering, NP   The attending provider will be Dr. Shanon Payor   Call report to [phone]  Rosanna Randy, RN @ [facility] notified.     Pt scheduled  to arrive at [facility] TOMORROW AFTER 0900.    Damita Dunnings, MSW, LCSW-A  2:35 PM 08/10/2023

## 2023-08-10 NOTE — ED Notes (Signed)
This medic spoke with Shanda Bumps Tax adviser) of [employer]. She requested to fax information over for possible placement.

## 2023-08-10 NOTE — ED Notes (Signed)
Message from T. Stribin, LCSW - CSW spoke with the Intake RN at [employer]. Per the Intake RN, the patient's insurance is invalid and therefore acceptance has been cancelled. CSW will informed ED RN staff and leave notice for the dayshift CSW to follow-up in the AM.
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
## 2023-08-10 NOTE — ED Notes (Signed)
Received message from T. Stribin, LCSW that pt has been accepted @ [facility] [place], accepting Dr. Shanon Payor [phone] for tomorrow 08/11/23.

## 2023-08-10 NOTE — ED Notes (Signed)
Pt is starting to pace the room in circles and asking about nicotine gum, RN notified.

## 2023-08-10 NOTE — ED Provider Notes (Signed)
Emergency Medicine Observation Re-evaluation Note  Julia [name] is a 23 y.o. female, seen on rounds today.  Pt initially presented to the ED for complaints of Headache Currently, the patient is resting  Physical Exam  BP 106/68 (BP Location: Right Arm)   Pulse 84   Temp 98.1 F (36.7 C) (Oral)   Resp 18   Ht 5\' 6"  (1.676 m)   Wt 83 kg   LMP 07/18/2023   SpO2 99%   BMI 29.53 kg/m  Physical Exam General: NAD Cardiac: RR Lungs: normal WOB Psych: Calm   ED Course / MDM  EKG:   I have reviewed the labs performed to date as well as medications administered while in observation.  Recent changes in the last 24 hours include None.  Plan  Current plan is for inpatient psych vs discharge to jail.     Coral Spikes, DO 08/10/23 [phone]

## 2023-08-10 NOTE — ED Notes (Signed)
Pt requesting nicotine gum with am medications.

## 2023-08-10 NOTE — ED Notes (Signed)
Pt states she is feeling more anxious than usual and is requesting 3rd dose of PRN meds now. Advised that I would message EDP for further guidance.

## 2023-08-11 DIAGNOSIS — F69 Unspecified disorder of adult personality and behavior: Secondary | ICD-10-CM | POA: Diagnosis not present

## 2023-08-11 DIAGNOSIS — F29 Unspecified psychosis not due to a substance or known physiological condition: Secondary | ICD-10-CM

## 2023-08-11 NOTE — Progress Notes (Signed)
Pt has been accepted to [facility] on 08/11/2023 Bed assignment: UNIT 300   Pt meets inpatient criteria per: Estill Cotta NP  Attending Physician will be: Sherrian Divers MD  Report can be called to: [phone]  Pt can arrive ASAP   Care Team Notified:[facility], Estill Cotta NP  [place] Anjelika Ausburn LCSW-A   08/11/2023 11:17 AM

## 2023-08-11 NOTE — Progress Notes (Signed)
LCSW Progress Note  [id number]   Julia [name]  08/11/2023  10:51 AM  Description:   Inpatient Psychiatric Referral  Patient was recommended inpatient per Estill Cotta NP. There are [facility] at [facility] per [facility] Rona Ravens RN. Patient was referred to the following out of network facilities:    Production assistant, radio Address Phone Fax  [facility] [address], [place] [address] [phone]  [facility] [address] [place], [place] [address] [phone]  [facility] [address] [place] [address] [phone]  [facility] [address], [place] [address] [phone]  [facility] [address], [place] [address] [phone]  [facility] [address] [place], [place] [address] [phone]  [facility] [address] [place] [address] [phone]  [facility] [address] [place] [address] [phone]  [facility] [address] [place] [address] [phone]  [facility] [address], [place] [address] [phone]  [facility] [address], [place] [address] [phone]  [facility] [address] [place] [address] [phone]  [facility] [place] [phone]  [facility] [address] [place] [address] [phone]  [facility] [address] [name] [phone]  [facility] [address], [place] [address] [phone]  [facility] [address], [place] [address] [phone]  [facility] [address], [place] [address] [phone]  [facility] [address] [place] [address] [phone]  [facility] [address], [place] [address] [phone]  [facility] [address] [place] [address] [phone]  [facility] [address] [place] [address] [phone]  [facility] [place] [phone]  [facility] [address] [place], [place] [address] [phone]  [facility] Patient Placement [facility], [place] [phone]  [facility] [address] [place] [address] [phone]  [facility] [address] [place] [address] [phone]      Situation ongoing, CSW to continue following and update chart as more information becomes available.      [name], MSW, LCSW  08/11/2023 10:51 AM

## 2023-08-11 NOTE — ED Provider Notes (Signed)
  Physical Exam  BP 101/65   Pulse 81   Temp 98.4 F (36.9 C)   Resp 15   Ht 5\' 6"  (1.676 m)   Wt 83 kg   LMP 07/18/2023   SpO2 98%   BMI 29.53 kg/m   Physical Exam  Procedures  Procedures  ED Course / MDM    Medical Decision Making Amount and/or Complexity of Data Reviewed Labs: ordered.  Risk Prescription drug management.   [facility] to [facility].  Dr. Joycelyn Rua accepting.  Reevaluated prior to transfer.       Benjiman Core, MD 08/11/23 1158

## 2023-08-11 NOTE — Consult Note (Signed)
Attempted to assess patient at 930am. Unsuccessful, patient uncooperative with TTS reassessment. She was observed lying in the bed on her side, covers pulled over her head. NT attempted to wake patient, and she said "dont touch me" in a rude manner. Additional attempts to awaken patient were met with silence. Patient continues to meet inpatient criteria. Will continue to monitor.

## 2023-08-23 ENCOUNTER — Other Ambulatory Visit: Payer: Self-pay

## 2023-08-23 ENCOUNTER — Emergency Department (HOSPITAL_COMMUNITY)
Admission: EM | Admit: 2023-08-23 | Discharge: 2023-08-23 | Disposition: A | Discharge status: Home / Self Care | Payer: MEDICAID | Attending: Emergency Medicine | Admitting: Emergency Medicine

## 2023-08-23 ENCOUNTER — Encounter (HOSPITAL_COMMUNITY): Payer: Self-pay

## 2023-08-23 DIAGNOSIS — R440 Auditory hallucinations: Secondary | ICD-10-CM | POA: Insufficient documentation

## 2023-08-23 DIAGNOSIS — F1410 Cocaine abuse, uncomplicated: Secondary | ICD-10-CM | POA: Insufficient documentation

## 2023-08-23 DIAGNOSIS — F1310 Sedative, hypnotic or anxiolytic abuse, uncomplicated: Secondary | ICD-10-CM | POA: Diagnosis not present

## 2023-08-23 DIAGNOSIS — R441 Visual hallucinations: Secondary | ICD-10-CM | POA: Diagnosis not present

## 2023-08-23 DIAGNOSIS — R443 Hallucinations, unspecified: Secondary | ICD-10-CM

## 2023-08-23 LAB — CBC WITH DIFFERENTIAL/PLATELET
Abs Immature Granulocytes: 0.08 10*3/uL — ABNORMAL HIGH (ref 0.00–0.07)
Basophils Absolute: 0.1 10*3/uL (ref 0.0–0.1)
Basophils Relative: 1 %
Eosinophils Absolute: 0.1 10*3/uL (ref 0.0–0.5)
Eosinophils Relative: 1 %
HCT: 39.7 % (ref 36.0–46.0)
Hemoglobin: 12.9 g/dL (ref 12.0–15.0)
Immature Granulocytes: 1 %
Lymphocytes Relative: 34 %
Lymphs Abs: 2.5 10*3/uL (ref 0.7–4.0)
MCH: 28.3 pg (ref 26.0–34.0)
MCHC: 32.5 g/dL (ref 30.0–36.0)
MCV: 87.1 fL (ref 80.0–100.0)
Monocytes Absolute: 0.6 10*3/uL (ref 0.1–1.0)
Monocytes Relative: 8 %
Neutro Abs: 4.1 10*3/uL (ref 1.7–7.7)
Neutrophils Relative %: 55 %
Platelets: 317 10*3/uL (ref 150–400)
RBC: 4.56 MIL/uL (ref 3.87–5.11)
RDW: 13.6 % (ref 11.5–15.5)
WBC: 7.4 10*3/uL (ref 4.0–10.5)
nRBC: 0 % (ref 0.0–0.2)

## 2023-08-23 LAB — COMPREHENSIVE METABOLIC PANEL
ALT: 20 U/L (ref 0–44)
AST: 20 U/L (ref 15–41)
Albumin: 3.8 g/dL (ref 3.5–5.0)
Alkaline Phosphatase: 68 U/L (ref 38–126)
Anion gap: 10 (ref 5–15)
BUN: 14 mg/dL (ref 6–20)
CO2: 23 mmol/L (ref 22–32)
Calcium: 9.3 mg/dL (ref 8.9–10.3)
Chloride: 105 mmol/L (ref 98–111)
Creatinine, Ser: 0.73 mg/dL (ref 0.44–1.00)
GFR, Estimated: >60 mL/min (ref >60)
Glucose, Bld: 113 mg/dL — ABNORMAL HIGH (ref 70–99)
Potassium: 4.2 mmol/L (ref 3.5–5.1)
Sodium: 138 mmol/L (ref 135–145)
Total Bilirubin: 0.5 mg/dL (ref 0.0–1.2)
Total Protein: 7.2 g/dL (ref 6.5–8.1)

## 2023-08-23 LAB — RAPID URINE DRUG SCREEN, HOSP PERFORMED
Amphetamines: NOT DETECTED
Barbiturates: NOT DETECTED
Benzodiazepines: POSITIVE — AB
Cocaine: POSITIVE — AB
Opiates: NOT DETECTED
Tetrahydrocannabinol: NOT DETECTED

## 2023-08-23 LAB — ETHANOL: Alcohol, Ethyl (B): <10 mg/dL (ref <10)

## 2023-08-23 LAB — PREGNANCY, URINE: Preg Test, Ur: NEGATIVE

## 2023-08-23 MED ORDER — ONDANSETRON HCL 4 MG PO TABS: 4.0000 mg | ORAL_TABLET | Freq: Three times a day (TID) | ORAL | Status: DC | PRN

## 2023-08-23 MED ORDER — CLONAZEPAM 0.5 MG PO TABS
1.0000 mg | ORAL_TABLET | Freq: Once | ORAL | Status: AC
  Administered 2023-08-23: 1 mg via ORAL
  Filled 2023-08-23: qty 2

## 2023-08-23 MED ORDER — IBUPROFEN 400 MG PO TABS: 600.0000 mg | ORAL_TABLET | Freq: Three times a day (TID) | ORAL | Status: DC | PRN

## 2023-08-23 MED ORDER — ALUM & MAG HYDROXIDE-SIMETH 200-200-20 MG/5ML PO SUSP: 30.0000 mL | Freq: Four times a day (QID) | ORAL | Status: DC | PRN

## 2023-08-23 MED ORDER — NICOTINE 7 MG/24HR TD PT24: 7.0000 mg | MEDICATED_PATCH | Freq: Every day | TRANSDERMAL | Status: DC

## 2023-08-23 NOTE — ED Notes (Signed)
 Pt has been wanded by security.
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
## 2023-08-23 NOTE — ED Provider Notes (Signed)
 [name]MENT AT [facility] Provider Note   CSN: [id number] Arrival date & time: 08/23/23  0044     History  Chief Complaint  Patient presents with   Hallucinations    Julia [name] is a 23 y.o. female.  The history is provided by the police. The history is limited by the condition of the patient (Psychiatric disorder).   She has history of schizoaffective disorder, substance abuse and was brought in by police with complaints o visual and auditory hallucinations.  Patient tells me now that she cannot remember what it was that she was seeing and hearing.  She was requesting a dose of clonazepam .  She denies ethanol and drug use.  She denies suicidal and homicidal ideation.  She apparently was recently discharged from [facility] hospital.  She is complaining to me of a lump in her right breast and a lump in her right arm.   Home Medications Prior to Admission medications   Medication Sig Start Date End Date Taking? Authorizing Provider  clonazePAM  (KLONOPIN ) 1 MG tablet Take 1 mg by mouth 3 (three) times daily as needed for anxiety.    [provider]  FLUoxetine  (PROZAC ) 20 MG capsule Take 1 capsule (20 mg total) by mouth daily. Patient not taking: Reported on 08/08/2023 01/03/23 08/08/23  Nguyen, Julie, DO  folic acid (FOLVITE) 1 MG tablet Take 1 mg by mouth every morning. 07/23/23   [provider]  loxapine  (LOXITANE ) 25 MG capsule Take 25 mg by mouth daily. Patient not taking: Reported on 08/08/2023 07/08/23   [provider]  QUEtiapine  (SEROQUEL ) 400 MG tablet Take 400 mg by mouth at bedtime. 04/16/23   [provider]  risperiDONE  (RISPERDAL ) 2 MG tablet Take 2 mg by mouth 2 (two) times daily. 07/23/23   [provider]  risperiDONE  (RISPERDAL ) 3 MG tablet Take 1 tablet (3 mg total) by mouth at bedtime. Patient not taking: Reported on 05/17/2023 01/03/23 04/05/23  Nguyen, Julie, DO  sertraline  (ZOLOFT )  50 MG tablet Take 50 mg by mouth daily. 05/26/23   [provider]      Allergies    Abilify  [aripiprazole ], Haldol  [haloperidol ], and Tylenol  [acetaminophen ]    Review of Systems   Review of Systems  Unable to perform ROS: Psychiatric disorder    Physical Exam Updated Vital Signs BP 110/75   Pulse 80   Temp 98.4 F (36.9 C) (Oral)   Resp 16   Ht 5' 6 (1.676 m)   Wt 83 kg   LMP 08/14/2023   SpO2 98%   BMI 29.53 kg/m  Physical Exam Vitals and nursing note reviewed. Exam conducted with a chaperone present.   23 year old female, resting comfortably and in no acute distress. Vital signs are normal. Oxygen saturation is 98%, which is normal. Head is normocephalic and atraumatic. PERRLA, EOMI.  Lungs are clear without rales, wheezes, or rhonchi. Chest is nontender.  No abnormal breast mass identified. Heart has regular rate and rhythm without murmur. Abdomen is soft, flat, nontender. Extremities have no cyanosis or edema, full range of motion is present.  No lump palpable in her right arm. Skin is warm and dry without rash. Neurologic: Awake and alert, moves all extremities equally Psychiatric: Does not appear to be responding to internal stimuli.  ED Results / Procedures / Treatments   Labs (all labs ordered are listed, but only abnormal results are displayed) Labs Reviewed  RAPID URINE DRUG SCREEN, HOSP PERFORMED -  Abnormal; Notable for the following components:      Result Value   Cocaine POSITIVE (*)    Benzodiazepines POSITIVE (*)    All other components within normal limits  CBC WITH DIFFERENTIAL/PLATELET - Abnormal; Notable for the following components:   Abs Immature Granulocytes 0.08 (*)    All other components within normal limits  COMPREHENSIVE METABOLIC PANEL - Abnormal; Notable for the following components:   Glucose, Bld 113 (*)    All other components within normal limits  PREGNANCY, URINE  ETHANOL   Procedures Procedures    Medications  Ordered in ED Medications  clonazePAM  (KLONOPIN ) tablet 1 mg (1 mg Oral Given 08/23/23 0254)    ED Course/ Medical Decision Making/ A&P                                 Medical Decision Making Amount and/or Complexity of Data Reviewed Labs: ordered.  Risk Prescription drug management.   Hallucinations in patient with known history of schizoaffective disorder/schizophrenia.  I have reviewed her past records, and she has multiple ED visits for psychiatric issues, most recent ED visit was on 08/08/2023-08/11/2023, and she was transferred to [facility] psychiatric hospital at that time.  I have ordered routine screening labs and I am requesting TTS consultation.  Final Clinical Impression(s) / ED Diagnoses Final diagnoses:  Hallucinations    Rx / DC Orders ED Discharge Orders     None         Raford Lenis, MD 08/23/23 2253

## 2023-08-23 NOTE — ED Notes (Signed)
 Pt given pretzels, no crackers available
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
## 2023-08-23 NOTE — ED Provider Notes (Signed)
 Emergency Medicine Observation Re-evaluation Note  Julia [name] is a 23 y.o. female, seen on rounds today.  Pt initially presented to the ED for complaints of Hallucinations On rounds this morning, the patient is sleeping.  Physical Exam  BP 110/75   Pulse 80   Temp 98.4 F (36.9 C) (Oral)   Resp 16   Ht 5' 6 (1.676 m)   Wt 83 kg   LMP 08/14/2023   SpO2 98%   BMI 29.53 kg/m  Physical Exam General: resting, watching TV Cardiac: well-perfused Lungs: no resp distress Psych: calm  ED Course / MDM  EKG:   I have reviewed the labs performed to date as well as medications administered while in observation.  Recent changes in the last 24 hours include evaluated by psychiatry:  Clinician discussed patient care with [name], NP.  She recommended that patient follow up with outpatient care being set up by [facility] provider [facility].  Pt does not meet inpatient care criteria at this time.     Patient was sleepy and complaining of having to participate in assessment.  After being asked about SI, HI and A/V hallucinations and receiving a no on all three, clinician asked her if she would be safe to discharge and she said yes.  Patient had eyes closed and only said I don't know to inquiries.     Pt recently discharged from [facility].  She has been approved for an ACTT team and services at [facility].  .   On my assessment patient states she is ready to be discharged.  She requests another dose of Klonopin .  Patient is instructed to follow-up with her ACTT team and [facility].  Plan  Current plan is for DC w/ o/p f/u.  Given discharge instructions and return precautions, all questions answered to patient satisfaction.    Franklyn Sid SAILOR, MD 08/23/23 [phone]

## 2023-08-23 NOTE — ED Notes (Signed)
Pt refuses blood work at this time

## 2023-08-23 NOTE — ED Notes (Signed)
 TTS in process
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
## 2023-08-23 NOTE — Discharge Instructions (Signed)
 Please follow up with your ACTT team and services at [facility]. Please return to the ED with any thoughts of harming yourself or others, anything else that concerns you.

## 2023-08-23 NOTE — ED Notes (Signed)
 Pt refused blood work x 2

## 2023-08-23 NOTE — ED Notes (Signed)
 Pt to BR x 4 within 35 mins, pt to desk requesting Klonopin  stating "I feel like I'm about to scream and I'm going in and out of it"

## 2023-08-23 NOTE — ED Notes (Signed)
 Pt dressed out, socks provided

## 2023-08-30 ENCOUNTER — Encounter (HOSPITAL_COMMUNITY): Payer: Self-pay | Admitting: Emergency Medicine

## 2023-08-30 ENCOUNTER — Emergency Department (HOSPITAL_COMMUNITY)
Admission: EM | Admit: 2023-08-30 | Discharge: 2023-08-30 | Disposition: A | Discharge status: Home / Self Care | Payer: MEDICAID | Attending: Emergency Medicine | Admitting: Emergency Medicine

## 2023-08-30 ENCOUNTER — Other Ambulatory Visit: Payer: Self-pay

## 2023-08-30 DIAGNOSIS — F22 Delusional disorders: Secondary | ICD-10-CM | POA: Diagnosis present

## 2023-08-30 DIAGNOSIS — F29 Unspecified psychosis not due to a substance or known physiological condition: Secondary | ICD-10-CM | POA: Diagnosis not present

## 2023-08-30 MED ORDER — CLONAZEPAM 0.5 MG PO TABS
1.0000 mg | ORAL_TABLET | Freq: Once | ORAL | Status: AC
  Administered 2023-08-30: 1 mg via ORAL
  Filled 2023-08-30: qty 2
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
## 2023-08-30 NOTE — ED Notes (Signed)
Patient refused to allow this nurse to take her vital signs. Patient left ambulatory without incident. Rochelle, her aide, is on the way to pick her up and is aware that she is in the waiting room at this time.

## 2023-08-30 NOTE — ED Notes (Signed)
Patient resting comfortably with eyes close and in no apparent distress at this time.
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
## 2023-08-30 NOTE — ED Notes (Signed)
Patient awake and alert in bed, states her heart hurts too much for this nurse to take a blood pressure right now. Patient allows this nurse to check her pulse and oxygen with a portable O2 monitor. Medications given as ordered. Lights out to reduce stimulation. Patient has no needs or requests at this time.

## 2023-09-04 ENCOUNTER — Emergency Department (HOSPITAL_COMMUNITY)
Admission: EM | Admit: 2023-09-04 | Discharge: 2023-09-04 | Disposition: A | Discharge status: Home / Self Care | Payer: MEDICAID | Attending: Emergency Medicine | Admitting: Emergency Medicine

## 2023-09-04 ENCOUNTER — Encounter (HOSPITAL_COMMUNITY): Payer: Self-pay

## 2023-09-04 ENCOUNTER — Other Ambulatory Visit: Payer: Self-pay

## 2023-09-04 DIAGNOSIS — Z760 Encounter for issue of repeat prescription: Secondary | ICD-10-CM | POA: Insufficient documentation

## 2023-09-04 DIAGNOSIS — M7918 Myalgia, other site: Secondary | ICD-10-CM | POA: Diagnosis not present

## 2023-09-04 DIAGNOSIS — R52 Pain, unspecified: Secondary | ICD-10-CM

## 2023-09-04 NOTE — ED Provider Notes (Signed)
[place] EMERGENCY DEPARTMENT AT [facility] Provider Note   CSN: [id number] Arrival date & time: 09/04/23  2049     History  Chief Complaint  Patient presents with   Medication Refill    HOLY BATTENFIELD is a 23 y.o. female.  Patient is a 23 year old female with history of anxiety, depression, substance abuse.  Patient presenting today complaining of "pain all over".  Tells me she has been hurting for many months.  Patient told the triage nurse that she is here because she is out of her Klonopin, but did not mention this to me until I specifically asked.  No fevers or chills.  The history is provided by the patient.       Home Medications Prior to Admission medications   Medication Sig Start Date End Date Taking? Authorizing Provider  clonazePAM (KLONOPIN) 1 MG tablet Take 1 mg by mouth 3 (three) times daily as needed for anxiety.    [provider]  FLUoxetine (PROZAC) 20 MG capsule Take 1 capsule (20 mg total) by mouth daily. Patient not taking: Reported on 08/08/2023 01/03/23 08/08/23  Princess Bruins, DO  folic acid (FOLVITE) 1 MG tablet Take 1 mg by mouth every morning. Patient not taking: Reported on 08/23/2023 07/23/23   [provider]  loxapine (LOXITANE) 25 MG capsule Take 25 mg by mouth daily. 07/08/23   [provider]  QUEtiapine (SEROQUEL) 400 MG tablet Take 400 mg by mouth at bedtime. Patient not taking: Reported on 08/23/2023 04/16/23   [provider]  risperiDONE (RISPERDAL) 2 MG tablet Take 2 mg by mouth 2 (two) times daily. Patient not taking: Reported on 08/23/2023 07/23/23   [provider]  risperiDONE (RISPERDAL) 3 MG tablet Take 1 tablet (3 mg total) by mouth at bedtime. Patient not taking: Reported on 05/17/2023 01/03/23 04/05/23  Princess Bruins, DO  sertraline (ZOLOFT) 50 MG tablet Take 50 mg by mouth daily. Patient not taking: Reported on 08/23/2023 05/26/23   [provider]      Allergies    Abilify  [aripiprazole], Haldol [haloperidol], and Tylenol [acetaminophen]    Review of Systems   Review of Systems  All other systems reviewed and are negative.   Physical Exam Updated Vital Signs BP 120/80 (BP Location: Right Arm)   Pulse (!) 117   Temp 98.3 F (36.8 C) (Oral)   Resp 18   LMP 08/14/2023   SpO2 99%  Physical Exam Vitals and nursing note reviewed.  Constitutional:      General: She is not in acute distress.    Appearance: She is well-developed. She is not diaphoretic.  HENT:     Head: Normocephalic and atraumatic.  Cardiovascular:     Rate and Rhythm: Normal rate and regular rhythm.     Heart sounds: No murmur heard.    No friction rub. No gallop.  Pulmonary:     Effort: Pulmonary effort is normal. No respiratory distress.     Breath sounds: Normal breath sounds. No wheezing.  Abdominal:     General: Bowel sounds are normal. There is no distension.     Palpations: Abdomen is soft.     Tenderness: There is no abdominal tenderness.  Musculoskeletal:        General: Normal range of motion.     Cervical back: Normal range of motion and neck supple.  Skin:    General: Skin is warm and dry.  Neurological:     General: No focal deficit present.  Mental Status: She is alert and oriented to person, place, and time.     ED Results / Procedures / Treatments   Labs (all labs ordered are listed, but only abnormal results are displayed) Labs Reviewed - No data to display  EKG None  Radiology No results found.  Procedures Procedures    Medications Ordered in ED Medications - No data to display  ED Course/ Medical Decision Making/ A&P  Patient is a 23 year old female presenting here with complaints of all over body pain and requesting a refill of her Klonopin.  Patient arrives with stable vital signs and is afebrile.  She appears somewhat anxious.  Patient was informed that I would be unable to administer or refill Klonopin and that this needs to be taken  care of at her primary doctor's office.  I see nothing today that appears emergent and feel as though patient can safely be discharged.  Also of note is that a family member called and asked the nurse for [place] to not administer any Klonopin or give her any Klonopin due to possible abuse of this medication.  Upon reviewing the [facility] prescription database, patient did not fill 60 Klonopin roughly 1 month ago and has been filling this prescription monthly for quite some time.  Final Clinical Impression(s) / ED Diagnoses Final diagnoses:  None    Rx / DC Orders ED Discharge Orders     None         Geoffery Lyons, MD 09/04/23 2353

## 2023-09-04 NOTE — ED Notes (Signed)
Caregiver called and told secretary to pass along to not give pt klonopin.

## 2023-09-04 NOTE — Discharge Instructions (Signed)
Follow-up with your primary doctor to discuss having your medication refilled.

## 2023-09-04 NOTE — ED Triage Notes (Signed)
Pt complains of all over body pain. States "my nervous system is hurting" Requesting klonopin hasn't had prescription in 6 months.

## 2023-09-12 DIAGNOSIS — F69 Unspecified disorder of adult personality and behavior: Secondary | ICD-10-CM

## 2023-10-30 ENCOUNTER — Other Ambulatory Visit: Payer: Self-pay

## 2023-10-30 ENCOUNTER — Emergency Department (HOSPITAL_COMMUNITY)
Admission: EM | Admit: 2023-10-30 | Discharge: 2023-10-30 | Discharge status: Against Medical Advice | Payer: MEDICAID | Attending: Emergency Medicine | Admitting: Emergency Medicine

## 2023-10-30 ENCOUNTER — Encounter (HOSPITAL_COMMUNITY): Payer: Self-pay | Admitting: *Deleted

## 2023-10-30 DIAGNOSIS — Z5329 Procedure and treatment not carried out because of patient's decision for other reasons: Secondary | ICD-10-CM | POA: Insufficient documentation

## 2023-10-30 DIAGNOSIS — R1032 Left lower quadrant pain: Secondary | ICD-10-CM | POA: Insufficient documentation

## 2023-10-30 DIAGNOSIS — R451 Restlessness and agitation: Secondary | ICD-10-CM | POA: Diagnosis not present

## 2023-10-30 LAB — URINALYSIS, ROUTINE W REFLEX MICROSCOPIC
Bilirubin Urine: NEGATIVE
Glucose, UA: NEGATIVE mg/dL
Hgb urine dipstick: NEGATIVE
Ketones, ur: NEGATIVE mg/dL
Leukocytes,Ua: NEGATIVE
Nitrite: NEGATIVE
Protein, ur: 30 mg/dL — AB
Specific Gravity, Urine: 1.017 (ref 1.005–1.030)
pH: 6 (ref 5.0–8.0)

## 2023-10-30 LAB — PREGNANCY, URINE: Preg Test, Ur: NEGATIVE

## 2023-10-30 MED ORDER — CLONAZEPAM 0.5 MG PO TABS
1.0000 mg | ORAL_TABLET | Freq: Once | ORAL | Status: AC
  Administered 2023-10-30: 1 mg via ORAL
  Filled 2023-10-30: qty 2

## 2023-10-30 NOTE — ED Triage Notes (Signed)
 Pt with left abd pain for 4-5 years. + N/V, denies any diarrhea

## 2023-10-30 NOTE — ED Notes (Addendum)
 Pt wanting to leave and states "can I come back in a month?" Pt encouraged to stay until cleared by provider. Pt still wanting to leave. Pt explained risks of leaving AMA including death. Pt also refused d/c vitals.

## 2023-10-30 NOTE — ED Notes (Signed)
 Patient refused lab work with phlebotomy. Patient is shouting while in the hallway VT1 "stop talking to me" to self while phlebotomy came back to attempt sample again. staff not comfortable at this time to continue to attempt to retreive labwork.

## 2023-10-30 NOTE — ED Provider Notes (Signed)
 [place] EMERGENCY DEPARTMENT AT [facility] Provider Note   CSN: [id number] Arrival date & time: 10/30/23  1228     History  Chief Complaint  Patient presents with   Abdominal Pain    Julia [name] is a 23 y.o. female.  She has PMH of polysubstance abuse, substance induced mood disorder, schizoaffective disorder.  Presents to ER Today complaining of left lower quadrant abdominal pain, not able to tell me exactly when this started.  She is also complaining of burning all over her body x 4 years.  She states she also has lumps in her body that come and go in her abdomen and breasts but they are now gone, also x 4 years.  Denies urinary complaints.  States the thing that helps her pain is Klonopin.     Abdominal Pain      Home Medications Prior to Admission medications   Medication Sig Start Date End Date Taking? Authorizing Provider  clonazePAM (KLONOPIN) 1 MG tablet Take 1 mg by mouth 3 (three) times daily as needed for anxiety.    [provider]  FLUoxetine (PROZAC) 20 MG capsule Take 1 capsule (20 mg total) by mouth daily. Patient not taking: Reported on 08/08/2023 01/03/23 08/08/23  Nguyen, Julie, DO  folic acid (FOLVITE) 1 MG tablet Take 1 mg by mouth every morning. Patient not taking: Reported on 08/23/2023 07/23/23   [provider]  loxapine (LOXITANE) 25 MG capsule Take 25 mg by mouth daily. 07/08/23   [provider]  QUEtiapine (SEROQUEL) 400 MG tablet Take 400 mg by mouth at bedtime. Patient not taking: Reported on 08/23/2023 04/16/23   [provider]  risperiDONE (RISPERDAL) 2 MG tablet Take 2 mg by mouth 2 (two) times daily. Patient not taking: Reported on 08/23/2023 07/23/23   [provider]  risperiDONE (RISPERDAL) 3 MG tablet Take 1 tablet (3 mg total) by mouth at bedtime. Patient not taking: Reported on 05/17/2023 01/03/23 04/05/23  Nguyen, Julie, DO  sertraline (ZOLOFT) 50 MG tablet Take 50 mg by mouth  daily. Patient not taking: Reported on 08/23/2023 05/26/23   [provider]      Allergies    Abilify [aripiprazole], Haldol [haloperidol], and Tylenol [acetaminophen]    Review of Systems   Review of Systems  Gastrointestinal:  Positive for abdominal pain.    Physical Exam Updated Vital Signs BP 116/81   Pulse 99   Temp 98.4 F (36.9 C) (Oral)   Resp 16   Ht 5\' 6"  (1.676 m)   LMP  (LMP Unknown)   SpO2 98%   BMI 29.53 kg/m  Physical Exam Vitals and nursing note reviewed.  Constitutional:      General: She is not in acute distress.    Appearance: She is well-developed.  HENT:     Head: Normocephalic and atraumatic.  Eyes:     Conjunctiva/sclera: Conjunctivae normal.  Cardiovascular:     Rate and Rhythm: Normal rate and regular rhythm.     Heart sounds: No murmur heard. Pulmonary:     Effort: Pulmonary effort is normal. No respiratory distress.     Breath sounds: Normal breath sounds.  Abdominal:     Palpations: Abdomen is soft.     Tenderness: There is no abdominal tenderness.  Musculoskeletal:        General: No swelling.     Cervical back: Neck supple.  Skin:    General: Skin is warm and dry.     Capillary Refill: Capillary  refill takes less than 2 seconds.  Neurological:     General: No focal deficit present.     Mental Status: She is alert.  Psychiatric:        Mood and Affect: Affect is tearful.        Speech: Speech normal.        Behavior: Behavior is agitated.        Thought Content: Thought content does not include homicidal or suicidal plan.        Cognition and Memory: Cognition normal.     ED Results / Procedures / Treatments   Labs (all labs ordered are listed, but only abnormal results are displayed) Labs Reviewed  URINALYSIS, ROUTINE W REFLEX MICROSCOPIC - Abnormal; Notable for the following components:      Result Value   APPearance HAZY (*)    Protein, ur 30 (*)    Bacteria, UA MANY (*)    All other components within normal  limits  PREGNANCY, URINE  LIPASE, BLOOD  COMPREHENSIVE METABOLIC PANEL WITH GFR  CBC    EKG None  Radiology No results found.  Procedures Procedures    Medications Ordered in ED Medications - No data to display  ED Course/ Medical Decision Making/ A&P                                 Medical Decision Making Differential diagnosis includes but not limited to-gastritis, gastroenteritis, appendicitis, cholecystitis, diverticulitis, DKA, nephrolithiasis, gastroparesis, UTI, ovarian cyst, ovarian torsion, other ED course: Patient here for left lower quadrant pain, unable to give an exact timeframe, abdomen is soft with moderate left lower quadrant tenderness, due to patient's underlying psychiatric issues she is not the most reliable historian so plan was for labs and CT although she has reassuring vitals and exam.  She was agitated, not wanting to get labs, stated Klonopin seems to help her so given p.o. Klonopin plan was to attempt to get labs after she had had this.  Patient subsequently eloped from the ED, was not able to discuss with her recent benefits before she left.  Amount and/or Complexity of Data Reviewed Labs: ordered.  Risk Prescription drug management.           Final Clinical Impression(s) / ED Diagnoses Final diagnoses:  None    Rx / DC Orders ED Discharge Orders     None         Joshua Nieves 10/30/23 1502    Cheyenne Cotta, MD 10/30/23 [phone]

## 2023-11-03 ENCOUNTER — Other Ambulatory Visit: Payer: Self-pay

## 2023-11-03 ENCOUNTER — Encounter (HOSPITAL_COMMUNITY): Payer: Self-pay | Admitting: Emergency Medicine

## 2023-11-03 ENCOUNTER — Emergency Department (HOSPITAL_COMMUNITY)
Admission: EM | Admit: 2023-11-03 | Discharge: 2023-11-03 | Discharge status: Against Medical Advice | Payer: MEDICAID | Attending: Emergency Medicine | Admitting: Emergency Medicine

## 2023-11-03 DIAGNOSIS — Z5321 Procedure and treatment not carried out due to patient leaving prior to being seen by health care provider: Secondary | ICD-10-CM | POA: Insufficient documentation

## 2023-11-03 DIAGNOSIS — H538 Other visual disturbances: Secondary | ICD-10-CM | POA: Diagnosis not present

## 2023-11-03 DIAGNOSIS — R1110 Vomiting, unspecified: Secondary | ICD-10-CM | POA: Insufficient documentation

## 2023-11-03 NOTE — ED Notes (Signed)
Per registration pt has left the facility °

## 2023-11-03 NOTE — ED Triage Notes (Signed)
 Pt c/o vomiting and blurry vision x 3 days ago.

## 2023-11-04 ENCOUNTER — Other Ambulatory Visit: Payer: Self-pay

## 2023-11-04 ENCOUNTER — Encounter (HOSPITAL_COMMUNITY): Payer: Self-pay

## 2023-11-04 ENCOUNTER — Emergency Department (HOSPITAL_COMMUNITY)
Admission: EM | Admit: 2023-11-04 | Discharge: 2023-11-04 | Discharge status: Against Medical Advice | Payer: MEDICAID | Attending: Emergency Medicine | Admitting: Emergency Medicine

## 2023-11-04 DIAGNOSIS — F410 Panic disorder [episodic paroxysmal anxiety] without agoraphobia: Secondary | ICD-10-CM | POA: Insufficient documentation

## 2023-11-04 DIAGNOSIS — Z5321 Procedure and treatment not carried out due to patient leaving prior to being seen by health care provider: Secondary | ICD-10-CM | POA: Insufficient documentation

## 2023-11-04 NOTE — ED Notes (Signed)
 Pt seen leaving approximately an hour ago by registration and has not come back in

## 2023-11-04 NOTE — ED Triage Notes (Signed)
 Pt walked to ED says she is here for mental health, schizophrenia, when asked pt what sx she is having, she says none, then says, anxiety, panic attack. Pt will not leave BP cuff on arm to obtain vitals in triage.

## 2023-11-13 ENCOUNTER — Encounter (HOSPITAL_COMMUNITY): Payer: Self-pay

## 2023-11-13 ENCOUNTER — Other Ambulatory Visit: Payer: Self-pay

## 2023-11-13 ENCOUNTER — Emergency Department (HOSPITAL_COMMUNITY)
Admission: EM | Admit: 2023-11-13 | Discharge: 2023-11-14 | Disposition: A | Discharge status: Other Acute Inpatient Hospital | Payer: MEDICAID | Attending: Emergency Medicine | Admitting: Emergency Medicine

## 2023-11-13 DIAGNOSIS — R45851 Suicidal ideations: Secondary | ICD-10-CM | POA: Insufficient documentation

## 2023-11-13 DIAGNOSIS — F259 Schizoaffective disorder, unspecified: Secondary | ICD-10-CM | POA: Insufficient documentation

## 2023-11-13 DIAGNOSIS — Z0279 Encounter for issue of other medical certificate: Secondary | ICD-10-CM | POA: Insufficient documentation

## 2023-11-13 DIAGNOSIS — F22 Delusional disorders: Secondary | ICD-10-CM | POA: Diagnosis not present

## 2023-11-13 LAB — CBC WITH DIFFERENTIAL/PLATELET
Abs Immature Granulocytes: 0.02 10*3/uL (ref 0.00–0.07)
Basophils Absolute: 0 10*3/uL (ref 0.0–0.1)
Basophils Relative: 0 %
Eosinophils Absolute: 0 10*3/uL (ref 0.0–0.5)
Eosinophils Relative: 1 %
HCT: 42.8 % (ref 36.0–46.0)
Hemoglobin: 13.6 g/dL (ref 12.0–15.0)
Immature Granulocytes: 0 %
Lymphocytes Relative: 30 %
Lymphs Abs: 2.3 10*3/uL (ref 0.7–4.0)
MCH: 28.3 pg (ref 26.0–34.0)
MCHC: 31.8 g/dL (ref 30.0–36.0)
MCV: 89 fL (ref 80.0–100.0)
Monocytes Absolute: 0.6 10*3/uL (ref 0.1–1.0)
Monocytes Relative: 8 %
Neutro Abs: 4.7 10*3/uL (ref 1.7–7.7)
Neutrophils Relative %: 61 %
Platelets: 250 10*3/uL (ref 150–400)
RBC: 4.81 MIL/uL (ref 3.87–5.11)
RDW: 13.8 % (ref 11.5–15.5)
WBC: 7.7 10*3/uL (ref 4.0–10.5)
nRBC: 0 % (ref 0.0–0.2)

## 2023-11-13 LAB — URINALYSIS, ROUTINE W REFLEX MICROSCOPIC
Bilirubin Urine: NEGATIVE
Glucose, UA: NEGATIVE mg/dL
Hgb urine dipstick: NEGATIVE
Ketones, ur: NEGATIVE mg/dL
Leukocytes,Ua: NEGATIVE
Nitrite: NEGATIVE
Protein, ur: NEGATIVE mg/dL
Specific Gravity, Urine: 1.008 (ref 1.005–1.030)
pH: 7 (ref 5.0–8.0)

## 2023-11-13 LAB — COMPREHENSIVE METABOLIC PANEL WITH GFR
ALT: 29 U/L (ref 0–44)
AST: 22 U/L (ref 15–41)
Albumin: 4 g/dL (ref 3.5–5.0)
Alkaline Phosphatase: 70 U/L (ref 38–126)
Anion gap: 9 (ref 5–15)
BUN: 7 mg/dL (ref 6–20)
CO2: 23 mmol/L (ref 22–32)
Calcium: 9.1 mg/dL (ref 8.9–10.3)
Chloride: 105 mmol/L (ref 98–111)
Creatinine, Ser: 0.7 mg/dL (ref 0.44–1.00)
GFR, Estimated: >60 mL/min (ref >60)
Glucose, Bld: 99 mg/dL (ref 70–99)
Potassium: 3.5 mmol/L (ref 3.5–5.1)
Sodium: 137 mmol/L (ref 135–145)
Total Bilirubin: 0.6 mg/dL (ref 0.0–1.2)
Total Protein: 7 g/dL (ref 6.5–8.1)

## 2023-11-13 LAB — RAPID URINE DRUG SCREEN, HOSP PERFORMED
Amphetamines: NOT DETECTED
Barbiturates: NOT DETECTED
Benzodiazepines: NOT DETECTED
Cocaine: NOT DETECTED
Opiates: NOT DETECTED
Tetrahydrocannabinol: NOT DETECTED

## 2023-11-13 LAB — SALICYLATE LEVEL: Salicylate Lvl: <7 mg/dL — ABNORMAL LOW (ref 7.0–30.0)

## 2023-11-13 LAB — HCG, QUANTITATIVE, PREGNANCY: hCG, Beta Chain, Quant, S: <1 m[IU]/mL (ref <5)

## 2023-11-13 LAB — PREGNANCY, URINE: Preg Test, Ur: NEGATIVE

## 2023-11-13 LAB — ETHANOL: Alcohol, Ethyl (B): <15 mg/dL (ref <15)

## 2023-11-13 LAB — ACETAMINOPHEN LEVEL: Acetaminophen (Tylenol), Serum: <10 ug/mL — ABNORMAL LOW (ref 10–30)

## 2023-11-13 MED ORDER — KETOROLAC TROMETHAMINE 60 MG/2ML IM SOLN
15.0000 mg | Freq: Once | INTRAMUSCULAR | Status: AC
  Administered 2023-11-13: 15 mg via INTRAMUSCULAR

## 2023-11-13 MED ORDER — CLONAZEPAM 0.5 MG PO TABS
1.0000 mg | ORAL_TABLET | Freq: Once | ORAL | Status: AC
  Administered 2023-11-13: 1 mg via ORAL

## 2023-11-13 MED ORDER — FAMOTIDINE 20 MG PO TABS
20.0000 mg | ORAL_TABLET | Freq: Once | ORAL | Status: DC
  Filled 2023-11-13: qty 1

## 2023-11-13 MED ORDER — CLONAZEPAM 0.5 MG PO TABS
0.5000 mg | ORAL_TABLET | Freq: Once | ORAL | Status: DC
  Filled 2023-11-13: qty 1

## 2023-11-13 MED ORDER — KETOROLAC TROMETHAMINE 15 MG/ML IJ SOLN
15.0000 mg | Freq: Once | INTRAMUSCULAR | Status: DC
  Filled 2023-11-13: qty 1

## 2023-11-13 NOTE — ED Notes (Signed)
 Pt requesting a klonopin . MD states pt needs to complete her medical work up first. Pt continues to refuse lab work at this time. States" I have no blood in my body"

## 2023-11-13 NOTE — ED Notes (Signed)
 Pt awake. States the iv "will just make me hurt even more". Offered shot and pt agreed. Edp aware. Asked pt where she is hurting and states "my blood hurts".
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
## 2023-11-13 NOTE — ED Notes (Signed)
 IVC PAPERWORK FAXED TO [facility] @ [phone], [phone], [phone]

## 2023-11-13 NOTE — ED Notes (Signed)
 Pt given a grape juice and lab called to finish lab work that was refused to be collected.

## 2023-11-13 NOTE — TOC Progression Note (Signed)
 Transition of Care [facility]) - Progression Note    Patient Details  Name: Julia [name] MRN: [id number] Date of Birth: Jun 06, 2001  Transition of [facility]) CM/SW Contact  Valley Gavia, LCSWA Phone Number: 11/13/2023, 11:13 PM  Clinical Narrative:     Pt has been accepted to [facility] [place] TOMORROW  11/14/2023 pending negative covid test  Bed assignment: [facility] Unit A   Pt meets inpatient criteria per: Nickola Baron NP   Attending Physician will be Dawne Euler, MD   Report can be called to: [phone]  Pt can arrive AFTER 8 AM   Care Team Notified:  Artemio Bilberry MD, Patrina Boos RN   Jaslen Adcox LCSW-A        Expected Discharge Plan and Services                                               Social Determinants of Health (SDOH) Interventions SDOH Screenings   Food Insecurity: No Food Insecurity (12/29/2022)  Housing: Low Risk  (12/29/2022)  Transportation Needs: No Transportation Needs (12/29/2022)  Utilities: Not At Risk (12/29/2022)  Alcohol Screen: Low Risk  (12/29/2022)  Tobacco Use: Medium Risk (11/13/2023)    Readmission Risk Interventions     No data to display

## 2023-11-13 NOTE — ED Triage Notes (Signed)
 Pt arrived via POV c/o SI and pain. Pt reports feeling paranoid, and is having difficulty with memory and sleep.

## 2023-11-13 NOTE — ED Notes (Signed)
 Spoke with caregiver Michel Agreste, who states she is returning a phone call. This nurse is not sure who called her or why she was called. She expressed that patient comes in to get out of her court cases and that she is seeking klonopin . She states she is available by phone if needed, her number is in the chart already.
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
## 2023-11-13 NOTE — ED Notes (Addendum)
 Pt very paranoid. Was fidgeting fingers and when tried to give pepcid  and pt states "I dont think I want to take that". Educated pt on what it does and pt still refused. Pt did agree to toradol  shot. Also had to encourage pt to put monitor stickers on for ecg.

## 2023-11-13 NOTE — TOC Initial Note (Signed)
 Transition of Care [facility]) - Initial/Assessment Note    Patient Details  Name: Julia [name] MRN: [id number] Date of Birth: [birth year]  [facility]) CM/SW Contact:    Valley Gavia, LCSWA Phone Number: 11/13/2023, 10:49 PM  Clinical Narrative:                  Inpatient Psychiatric Referral   Patient was recommended inpatient per Nickola Baron, NP . There are no available beds at [facility], per [facility] Shelbie Dess. Patient was referred to the following out of network facilities:   [facility] Pending - Request Sent -- [place] [address] [phone] --  [facility] Pending - Request Sent -- [address] [place] [address] [phone] --  [facility] Pending - Request Sent -- [address] [place], [place] [address] [phone] --  [facility] Pending - Request Sent -- [address] [place] [address] [phone] --  [facility] Pending - Request Sent -- [address] [place], [place] [address] [phone] --  [facility] Pending - Request Sent -- [address] [place] [address] [phone] --  [facility] Pending - Request Sent -- [address] [place] [address] [phone] --  [facility] Pending - Request Sent -- [address] [place] [address] [phone] --  [facility] Pending - Request Sent -- [address] [place] [address] [phone] --  [facility] Pending - Request Sent -- [address], [place] [address] [phone] --  [facility] Pending - Request Sent -- [place] [phone] --  [facility] Pending - Request Sent --  [address] [place] [address] [phone] --  [facility] Pending - Request Sent -- [address] [place] [address] [phone] --  [facility] Pending - Request Sent -- [address] [place] [address] [phone] --  [facility] Pending - Request Sent -- [address] [place] [phone] --  [facility] Pending - Request Sent -- [address] [place] [address] [phone] --  [facility] Pending - Request Sent -- [address] [place] [address] [phone] --  [facility] Pending - Request Sent -- [address] [place] [address] [phone] --  [facility] Pending - Request Sent -- [address], [place] [address] [phone] --  [facility] Pending - Request Sent -- [address] [place] [address] [phone] --  [facility] Pending - Request Sent -- [address] [place], [place] [address] [phone] --  [facility] Pending - Request Sent -- [address] [place] [address] [phone]           Patient Goals and CMS Choice            Expected Discharge Plan and Services                                              Prior Living Arrangements/Services  Activities of Daily Living      Permission Sought/Granted                  Emotional Assessment              Admission diagnosis:  Medical Clearance Patient Active Problem List   Diagnosis Date Noted   Behavior concern in adult 09/12/2023   Schizoaffective disorder, depressive type (HCC) 12/29/2022    Suicidal ideations    Substance-induced disorder (HCC) 04/27/2021   Malingering 06/02/2020   Homicidal ideation 06/02/2020   Paranoia (HCC) 06/02/2020   Suicidal behavior 06/02/2020   Schizophrenia (HCC) 06/02/2020   Schizoaffective disorder (HCC) 05/08/2020   Schizophrenia, paranoid (HCC)    Cocaine dependence with cocaine-induced psychotic disorder with complication (HCC)    Psychosis (HCC) 05/15/2019   Social anxiety disorder 09/25/2016   Substance induced mood disorder (HCC) 04/29/2016   Polysubstance abuse (HCC)    Dysmenorrhea 12/30/2013   PCP:  [facility]:   [facility] [address] [place] [address] [place] [address] [place] [address] [place] [address] Phone: [phone] Fax: [phone]     Social Drivers of Health (SDOH) Social History: SDOH Screenings   Food Insecurity: No Food Insecurity (12/29/2022)  Housing: Low Risk  (12/29/2022)  Transportation Needs: No Transportation Needs (12/29/2022)  Utilities: Not At Risk (12/29/2022)  Alcohol Screen: Low Risk  (12/29/2022)  Tobacco Use: Medium Risk (11/13/2023)   SDOH Interventions:     Readmission Risk Interventions     No data to display

## 2023-11-13 NOTE — ED Notes (Signed)
 Pt belongings placed in locker #10. Black sweatshirt, black and grey pants, black slippers, orange lighter, perfume and a $1 bill.
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
## 2023-11-13 NOTE — ED Notes (Signed)
 Pt wanted to shower. Advised she appears a little groggy after clonopin given and needs to wait a while due to risk for falling. Pt agreed at this time.

## 2023-11-13 NOTE — ED Notes (Signed)
 All belongings taken to locker. Security in to wand. Pt was very eager to take her Klonopin .
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
## 2023-11-13 NOTE — ED Notes (Signed)
Pt refusing blood work to be drawn at this time.

## 2023-11-14 LAB — SARS CORONAVIRUS 2 BY RT PCR: SARS Coronavirus 2 by RT PCR: NEGATIVE

## 2023-11-14 NOTE — ED Notes (Signed)
 Patient up to the bathroom. Is now eating sandwich brought to her earlier. She is calm and cooperative.

## 2023-11-14 NOTE — ED Notes (Signed)
 Patient given sandwich and a ginger ale, per request.

## 2023-11-14 NOTE — ED Notes (Signed)
 Patient swabbed for Covid per request of [facility] in [place]. Patient easily awakened and responsive. Cooperative with care at this time. Denies any needs and has no requests at this time. Offered patient an extra blanket, declined. Patient in bed with sitter at bedside.

## 2023-11-26 ENCOUNTER — Encounter (HOSPITAL_COMMUNITY): Payer: Self-pay

## 2023-11-26 ENCOUNTER — Emergency Department (HOSPITAL_COMMUNITY)
Admission: EM | Admit: 2023-11-26 | Discharge: 2023-11-26 | Disposition: A | Discharge status: Home / Self Care | Payer: MEDICAID | Attending: Emergency Medicine | Admitting: Emergency Medicine

## 2023-11-26 ENCOUNTER — Other Ambulatory Visit: Payer: Self-pay

## 2023-11-26 DIAGNOSIS — F99 Mental disorder, not otherwise specified: Secondary | ICD-10-CM

## 2023-11-26 DIAGNOSIS — F4312 Post-traumatic stress disorder, chronic: Secondary | ICD-10-CM | POA: Insufficient documentation

## 2023-11-26 NOTE — ED Notes (Signed)
 Patient belongings returned to patient from [facility] locker

## 2023-11-26 NOTE — Discharge Instructions (Addendum)
 Please follow-up with your psychiatrist and case manager for additional help.

## 2023-11-26 NOTE — ED Provider Notes (Signed)
 [place] EMERGENCY DEPARTMENT AT [facility] Provider Note   CSN: [id number] Arrival date & time: 11/26/23  1801     History  Chief Complaint  Patient presents with   Mental Health Problem    Julia [name] is a 23 y.o. female.  HPI     23 year old female comes in with chief complaint of mental health problem.  Patient has history of polysubstance use, substance use disorder, schizoaffective disorder.  She was just seen in the ER earlier in May and admitted to [facility].  Patient states that she is having pain all over. She also states that she has history of PTSD and wants help.  I called patient's emergency contact and spoke with Ms. Michel Agreste, who happens to be patient's aunt.  According to her, patient lives with her, she rents a room in her home.  She is well taking care of.  She states that patient was discharged from [facility] [place], and patient was given prescription for Klonopin .  She indicates that patient has history of Klonopin  abuse and when she uses it, she starts using alcohol.  She states that patient had a meeting with case manager just earlier today.  There was conversation about patient's court cases and patient decided come to the ER.  Patient had court hearings recently and had come to the ER at that time as well.  Home Medications Prior to Admission medications   Medication Sig Start Date End Date Taking? Authorizing Provider  clonazePAM  (KLONOPIN ) 0.5 MG tablet Take 0.5 mg by mouth 2 (two) times daily. 11/20/23   [provider]  QUEtiapine  (SEROQUEL ) 400 MG tablet Take 400 mg by mouth at bedtime. Patient not taking: Reported on 11/13/2023 04/16/23   [provider]  sertraline  (ZOLOFT ) 50 MG tablet Take 50 mg by mouth daily. Patient not taking: Reported on 08/23/2023 05/26/23   [provider]      Allergies    Abilify  [aripiprazole ], Haldol  [haloperidol ], and Tylenol  [acetaminophen ]    Review of Systems   Review of  Systems  All other systems reviewed and are negative.   Physical Exam Updated Vital Signs BP 117/81 (BP Location: Right Arm)   Pulse (!) 101   Temp 98.5 F (36.9 C) (Oral)   Resp 18   Ht 5\' 6"  (1.676 m)   Wt 83 kg   LMP 11/02/2023   SpO2 93%   BMI 29.53 kg/m  Physical Exam Vitals and nursing note reviewed.  Constitutional:      Appearance: She is well-developed.  HENT:     Head: Atraumatic.  Cardiovascular:     Rate and Rhythm: Normal rate.  Pulmonary:     Effort: Pulmonary effort is normal.  Musculoskeletal:     Cervical back: Normal range of motion and neck supple.  Skin:    General: Skin is warm and dry.  Neurological:     Mental Status: She is alert and oriented to person, place, and time.  Psychiatric:        Attention and Perception: Attention normal.        Mood and Affect: Mood is anxious.        Speech: Speech is rapid and pressured.        Behavior: Behavior is cooperative.        Thought Content: Thought content does not include homicidal or suicidal ideation.     ED Results / Procedures / Treatments   Labs (all labs ordered are listed, but only abnormal results  are displayed) Labs Reviewed - No data to display  EKG None  Radiology No results found.  Procedures Procedures    Medications Ordered in ED Medications - No data to display  ED Course/ Medical Decision Making/ A&P                                 Medical Decision Making  Patient comes to the ER with cc of mental health issues, PTSD, pain all over. Patient has pertinent past medical history of -schizoaffective disorder, PTSD. She was just admitted to the hospital at [facility] and discharged last Tuesday. Currently patient is calm, anxious appearing.   Pt denies nausea, emesis, fevers, chills, chest pains, shortness of breath, headaches, abdominal pain, uti like symptoms and patient has no active medical complaints. I have reviewed previous encounters for this patient and  reviewed their primary medications.  Differential diagnosis considered for this patient includes: Medication side effects, medication withdrawal, acute on chronic PTSD, drug-seeking behavior.  Upon calling family, it appears that patient has history of Klonopin  abuse.  They informed me that we should try and not give her Klonopin .  Patient was just given Klonopin  from [facility].  I reviewed [place]  controlled substance database, it appears that patient was getting regular Klonopin  in 2024, there was a gap thereafter until she picked up the prescription in May.  When I confronted patient about this, she did tell me that her psychiatrist stopped Klonopin .  She also requested that I give her Klonopin .  At this time patient does not have any psychiatric emergency.  She only was given 14 Klonopin 's last week, she is not demonstrating any signs of acute withdrawals right now, and I have low suspicion that she will have life-threatening withdrawals given that she had a long period of no Klonopin  use until May.  Her family member is going to pick her up.  She is stable for discharge.   Final Clinical Impression(s) / ED Diagnoses Final diagnoses:  Psychiatric illness    Rx / DC Orders ED Discharge Orders     None         Deatra Face, MD 11/26/23 1927

## 2023-11-26 NOTE — ED Triage Notes (Signed)
 Pt reports she needs help with her PTSD.

## 2023-11-27 ENCOUNTER — Other Ambulatory Visit: Payer: Self-pay

## 2023-11-27 ENCOUNTER — Encounter (HOSPITAL_COMMUNITY): Payer: Self-pay

## 2023-11-27 ENCOUNTER — Emergency Department (HOSPITAL_COMMUNITY)
Admission: EM | Admit: 2023-11-27 | Discharge: 2023-11-28 | Discharge status: Against Medical Advice | Payer: MEDICAID | Attending: Emergency Medicine | Admitting: Emergency Medicine

## 2023-11-27 DIAGNOSIS — Z5321 Procedure and treatment not carried out due to patient leaving prior to being seen by health care provider: Secondary | ICD-10-CM | POA: Insufficient documentation

## 2023-11-27 DIAGNOSIS — R1110 Vomiting, unspecified: Secondary | ICD-10-CM | POA: Insufficient documentation

## 2023-11-27 DIAGNOSIS — R1084 Generalized abdominal pain: Secondary | ICD-10-CM

## 2023-11-27 DIAGNOSIS — R109 Unspecified abdominal pain: Secondary | ICD-10-CM | POA: Insufficient documentation

## 2023-11-27 NOTE — ED Notes (Signed)
Pt called twice for room, no response.

## 2023-11-27 NOTE — ED Triage Notes (Addendum)
 Pt arrived via [facility] with generalized mental health complaints while in ambulance denies SI or HI. In triage pt c/o  abd pain and emesis that began today. In triage pt denies any mental health complaints and states that she is worried about having appendicitis.

## 2023-11-28 NOTE — ED Notes (Signed)
 Pt refused labs a second time asking for Klonopin  because she is stressed. Pt told right now that is not something we can give her. Pt stated that she is not giving her blood until she gets some.

## 2023-11-28 NOTE — ED Notes (Signed)
 Left a HIPAA safe voicemail for the aide in the chart [place]
